# Patient Record
Sex: Female | Born: 1935 | Race: White | Hispanic: No | Smoking: Never smoker
Health system: Southern US, Community
[De-identification: ages and names within clinical notes are randomized; demographics above are authoritative.]

## PROBLEM LIST (undated history)

## (undated) DIAGNOSIS — M810 Age-related osteoporosis without current pathological fracture: Secondary | ICD-10-CM

## (undated) DIAGNOSIS — F419 Anxiety disorder, unspecified: Secondary | ICD-10-CM

## (undated) DIAGNOSIS — I10 Essential (primary) hypertension: Secondary | ICD-10-CM

## (undated) DIAGNOSIS — K219 Gastro-esophageal reflux disease without esophagitis: Secondary | ICD-10-CM

## (undated) DIAGNOSIS — M79609 Pain in unspecified limb: Secondary | ICD-10-CM

## (undated) DIAGNOSIS — M419 Scoliosis, unspecified: Secondary | ICD-10-CM

## (undated) DIAGNOSIS — K5730 Diverticulosis of large intestine without perforation or abscess without bleeding: Secondary | ICD-10-CM

## (undated) DIAGNOSIS — H269 Unspecified cataract: Secondary | ICD-10-CM

## (undated) DIAGNOSIS — I779 Disorder of arteries and arterioles, unspecified: Secondary | ICD-10-CM

## (undated) DIAGNOSIS — R32 Unspecified urinary incontinence: Secondary | ICD-10-CM

## (undated) DIAGNOSIS — R739 Hyperglycemia, unspecified: Secondary | ICD-10-CM

## (undated) DIAGNOSIS — IMO0002 Reserved for concepts with insufficient information to code with codable children: Secondary | ICD-10-CM

## (undated) DIAGNOSIS — K449 Diaphragmatic hernia without obstruction or gangrene: Secondary | ICD-10-CM

## (undated) DIAGNOSIS — M549 Dorsalgia, unspecified: Secondary | ICD-10-CM

## (undated) DIAGNOSIS — R002 Palpitations: Secondary | ICD-10-CM

## (undated) DIAGNOSIS — F32A Depression, unspecified: Secondary | ICD-10-CM

## (undated) DIAGNOSIS — F329 Major depressive disorder, single episode, unspecified: Secondary | ICD-10-CM

## (undated) DIAGNOSIS — N952 Postmenopausal atrophic vaginitis: Secondary | ICD-10-CM

## (undated) DIAGNOSIS — G4700 Insomnia, unspecified: Secondary | ICD-10-CM

## (undated) DIAGNOSIS — K5900 Constipation, unspecified: Secondary | ICD-10-CM

## (undated) DIAGNOSIS — N895 Stricture and atresia of vagina: Secondary | ICD-10-CM

## (undated) DIAGNOSIS — E7800 Pure hypercholesterolemia, unspecified: Secondary | ICD-10-CM

## (undated) DIAGNOSIS — R7989 Other specified abnormal findings of blood chemistry: Secondary | ICD-10-CM

## (undated) DIAGNOSIS — R609 Edema, unspecified: Secondary | ICD-10-CM

## (undated) DIAGNOSIS — R928 Other abnormal and inconclusive findings on diagnostic imaging of breast: Secondary | ICD-10-CM

## (undated) DIAGNOSIS — J310 Chronic rhinitis: Secondary | ICD-10-CM

## (undated) DIAGNOSIS — T7840XA Allergy, unspecified, initial encounter: Secondary | ICD-10-CM

## (undated) DIAGNOSIS — M542 Cervicalgia: Secondary | ICD-10-CM

## (undated) DIAGNOSIS — K4090 Unilateral inguinal hernia, without obstruction or gangrene, not specified as recurrent: Secondary | ICD-10-CM

## (undated) DIAGNOSIS — M25559 Pain in unspecified hip: Secondary | ICD-10-CM

## (undated) DIAGNOSIS — B351 Tinea unguium: Secondary | ICD-10-CM

## (undated) DIAGNOSIS — G44209 Tension-type headache, unspecified, not intractable: Secondary | ICD-10-CM

## (undated) DIAGNOSIS — N182 Chronic kidney disease, stage 2 (mild): Secondary | ICD-10-CM

## (undated) DIAGNOSIS — M25519 Pain in unspecified shoulder: Secondary | ICD-10-CM

## (undated) DIAGNOSIS — R351 Nocturia: Secondary | ICD-10-CM

## (undated) HISTORY — DX: Stricture and atresia of vagina: N89.5

## (undated) HISTORY — DX: Pain in unspecified shoulder: M25.519

## (undated) HISTORY — DX: Other abnormal and inconclusive findings on diagnostic imaging of breast: R92.8

## (undated) HISTORY — DX: Insomnia, unspecified: G47.00

## (undated) HISTORY — DX: Pain in unspecified limb: M79.609

## (undated) HISTORY — DX: Allergy, unspecified, initial encounter: T78.40XA

## (undated) HISTORY — DX: Tinea unguium: B35.1

## (undated) HISTORY — DX: Disorder of arteries and arterioles, unspecified: I77.9

## (undated) HISTORY — DX: Unspecified cataract: H26.9

## (undated) HISTORY — PX: CATARACT EXTRACTION: SUR2

## (undated) HISTORY — PX: COLONOSCOPY: SHX174

## (undated) HISTORY — PX: COSMETIC SURGERY: SHX468

## (undated) HISTORY — DX: Nocturia: R35.1

## (undated) HISTORY — PX: EYE SURGERY: SHX253

## (undated) HISTORY — DX: Unspecified urinary incontinence: R32

## (undated) HISTORY — DX: Palpitations: R00.2

## (undated) HISTORY — DX: Diverticulosis of large intestine without perforation or abscess without bleeding: K57.30

## (undated) HISTORY — DX: Chronic rhinitis: J31.0

## (undated) HISTORY — PX: TUBAL LIGATION: SHX77

## (undated) HISTORY — DX: Tension-type headache, unspecified, not intractable: G44.209

## (undated) HISTORY — DX: Hyperglycemia, unspecified: R73.9

## (undated) HISTORY — DX: Anxiety disorder, unspecified: F41.9

## (undated) HISTORY — DX: Postmenopausal atrophic vaginitis: N95.2

## (undated) HISTORY — DX: Chronic kidney disease, stage 2 (mild): N18.2

## (undated) HISTORY — PX: BREAST SURGERY: SHX581

## (undated) HISTORY — DX: Age-related osteoporosis without current pathological fracture: M81.0

## (undated) HISTORY — DX: Cervicalgia: M54.2

## (undated) HISTORY — PX: TONSILLECTOMY: SUR1361

## (undated) HISTORY — DX: Unilateral inguinal hernia, without obstruction or gangrene, not specified as recurrent: K40.90

## (undated) HISTORY — DX: Dorsalgia, unspecified: M54.9

## (undated) HISTORY — DX: Constipation, unspecified: K59.00

## (undated) HISTORY — DX: Other specified abnormal findings of blood chemistry: R79.89

## (undated) HISTORY — DX: Edema, unspecified: R60.9

## (undated) HISTORY — DX: Pain in unspecified hip: M25.559

---

## 1967-05-17 HISTORY — PX: ABDOMINAL HYSTERECTOMY: SHX81

## 1988-05-16 HISTORY — PX: CHOLECYSTECTOMY: SHX55

## 2000-05-16 HISTORY — PX: VAGINAL PROLAPSE REPAIR: SHX830

## 2000-05-16 HISTORY — PX: BILATERAL OOPHORECTOMY: SHX1221

## 2003-05-17 HISTORY — PX: OTHER SURGICAL HISTORY: SHX169

## 2004-11-26 DIAGNOSIS — H269 Unspecified cataract: Secondary | ICD-10-CM

## 2004-11-26 DIAGNOSIS — R32 Unspecified urinary incontinence: Secondary | ICD-10-CM | POA: Insufficient documentation

## 2004-11-26 DIAGNOSIS — R609 Edema, unspecified: Secondary | ICD-10-CM

## 2004-11-26 HISTORY — DX: Unspecified cataract: H26.9

## 2004-11-26 HISTORY — DX: Unspecified urinary incontinence: R32

## 2004-11-26 HISTORY — DX: Edema, unspecified: R60.9

## 2004-12-14 DIAGNOSIS — M810 Age-related osteoporosis without current pathological fracture: Secondary | ICD-10-CM

## 2004-12-14 DIAGNOSIS — N952 Postmenopausal atrophic vaginitis: Secondary | ICD-10-CM

## 2004-12-14 HISTORY — DX: Postmenopausal atrophic vaginitis: N95.2

## 2004-12-14 HISTORY — DX: Age-related osteoporosis without current pathological fracture: M81.0

## 2005-01-21 DIAGNOSIS — M549 Dorsalgia, unspecified: Secondary | ICD-10-CM

## 2005-01-21 DIAGNOSIS — K5900 Constipation, unspecified: Secondary | ICD-10-CM

## 2005-01-21 DIAGNOSIS — B351 Tinea unguium: Secondary | ICD-10-CM

## 2005-01-21 HISTORY — DX: Tinea unguium: B35.1

## 2005-01-21 HISTORY — DX: Dorsalgia, unspecified: M54.9

## 2005-01-21 HISTORY — DX: Constipation, unspecified: K59.00

## 2005-06-17 DIAGNOSIS — K5730 Diverticulosis of large intestine without perforation or abscess without bleeding: Secondary | ICD-10-CM

## 2005-06-17 HISTORY — DX: Diverticulosis of large intestine without perforation or abscess without bleeding: K57.30

## 2005-08-26 DIAGNOSIS — M79609 Pain in unspecified limb: Secondary | ICD-10-CM

## 2005-08-26 HISTORY — DX: Pain in unspecified limb: M79.609

## 2005-10-04 DIAGNOSIS — G44209 Tension-type headache, unspecified, not intractable: Secondary | ICD-10-CM

## 2005-10-04 HISTORY — DX: Tension-type headache, unspecified, not intractable: G44.209

## 2006-05-24 ENCOUNTER — Ambulatory Visit: Payer: Self-pay | Admitting: Gastroenterology

## 2006-05-25 ENCOUNTER — Encounter: Payer: Self-pay | Admitting: Gastroenterology

## 2006-05-30 ENCOUNTER — Ambulatory Visit: Payer: Self-pay | Admitting: Gastroenterology

## 2006-07-31 ENCOUNTER — Ambulatory Visit: Payer: Self-pay | Admitting: Gastroenterology

## 2006-08-01 ENCOUNTER — Ambulatory Visit: Payer: Self-pay | Admitting: Gastroenterology

## 2006-08-01 ENCOUNTER — Encounter (INDEPENDENT_AMBULATORY_CARE_PROVIDER_SITE_OTHER): Payer: Self-pay | Admitting: Specialist

## 2006-08-01 LAB — HM COLONOSCOPY

## 2006-08-15 DIAGNOSIS — M25519 Pain in unspecified shoulder: Secondary | ICD-10-CM

## 2006-08-15 HISTORY — DX: Pain in unspecified shoulder: M25.519

## 2006-08-29 ENCOUNTER — Ambulatory Visit: Payer: Self-pay | Admitting: Gastroenterology

## 2006-10-02 ENCOUNTER — Ambulatory Visit: Payer: Self-pay | Admitting: Gastroenterology

## 2006-10-03 LAB — CONVERTED CEMR LAB
ALT: 23 units/L (ref 0–40)
Albumin: 3.7 g/dL (ref 3.5–5.2)
Alkaline Phosphatase: 56 units/L (ref 39–117)
Total Bilirubin: 0.4 mg/dL (ref 0.3–1.2)
Total Protein: 6.5 g/dL (ref 6.0–8.3)

## 2007-04-03 DIAGNOSIS — R7989 Other specified abnormal findings of blood chemistry: Secondary | ICD-10-CM

## 2007-04-03 HISTORY — DX: Other specified abnormal findings of blood chemistry: R79.89

## 2008-09-19 DIAGNOSIS — R351 Nocturia: Secondary | ICD-10-CM

## 2008-09-19 HISTORY — DX: Nocturia: R35.1

## 2009-04-03 DIAGNOSIS — N182 Chronic kidney disease, stage 2 (mild): Secondary | ICD-10-CM

## 2009-04-03 HISTORY — DX: Chronic kidney disease, stage 2 (mild): N18.2

## 2009-09-29 DIAGNOSIS — R002 Palpitations: Secondary | ICD-10-CM

## 2009-09-29 HISTORY — DX: Palpitations: R00.2

## 2010-09-28 DIAGNOSIS — J310 Chronic rhinitis: Secondary | ICD-10-CM

## 2010-09-28 HISTORY — DX: Chronic rhinitis: J31.0

## 2010-10-01 NOTE — Assessment & Plan Note (Signed)
[facility]                         GASTROENTEROLOGY OFFICE NOTE   Tina Hampton, Tina Hampton                    MRN:          [id number]  DATE:05/24/2006                            DOB:          [birth year]    REFERRING PHYSICIAN:  [name]. Chilton Si, M.D.   REASON FOR REFERRAL:  Dr. Chilton Si asked me to evaluate Tina Hampton in  consultation regarding FOBT positive stool.   HPI:  Tina Hampton is a very pleasant and well-informed 77 year old  woman, who was originally diagnosed with iron deficiency anemia in 2006,  while she was living in [place].  She never required blood transfusions  for this, but she did undergo evaluation with a full colonoscopy, as  well as an upper endoscopy.  The colonoscopy was her third colonoscopy  in five to ten years.  Dr. Verta Ellen in [place] performed the  colonoscopy and found sigmoid diverticulosis and a small hyperplastic  polyp in her cecum.  An EGD was also performed in which she describes  some hyperemia at the GE junction, consistent with esophagitis, and a  small hiatal hernia.  Biopsies from the duodenum showed no sign of  sprue.  Stomach biopsies showed no H. pylori.  She also had a small  bowel follow-through that was normal.  Her treatment for her iron  deficiency anemia was to be placed on iron, as well as a proton pump  inhibitor.  The proton pump inhibitor was eventually changed to an H2  blocker by her hematologist and her iron was weaned as her hemoglobin  increased back to normal.  She was found again to be anemic, maybe a  year ago, was put back on iron and the anemia resolved.  She comes in  now with the report of a single black stool, one week ago.  This was  heme tested at her extended care facility, was found to be FOBT  positive.  Since then, she has had normal stools and she has been heme  testing all of them and they were all heme-negative.  She, in  retrospect, has felt fatigued for the past month  or so.  She held her  aspirin, starting a week ago.  She is still on her Pepcid AC and she has  not changed that to a PPI.   REVIEW OF SYSTEMS:  Known for mild fatigue.  No chest pain, no shortness  of breath.  Otherwise essentially normal and is available on the risk  intake sheet.   PAST MEDICAL HISTORY:  Mild depression, hypertension, elevated  cholesterol, arthritis, anxiety, hysterectomy in 1969, vaginal prolapse  repaired in 2002, cholecystectomy in the 1990s.   CURRENT MEDICATIONS:  Effexor, Menostar, Boniva, Premarin, glucosamine,  calcium, Pepcid AC, aspirin 81 mg once daily, Vytorin,  hydrochlorothiazide, Adalat, Centrum Silver, stool softener.   ALLERGIES:  To PENICILLIN.   SOCIAL HISTORY:  Married, three sons, retired Runner, broadcasting/film/video, nonsmoker,  nondrinker, but lives with her husband at nearby extended care facility.   FAMILY HISTORY:  No colon polyps or colon cancer in family besides her  own history of hyperplastic polyps.   PHYSICAL EXAMINATION:  The  patient is 5 feet 3.5 inches, 160 pounds,  blood pressure 122/64, pulse 84.  CONSTITUTIONAL:  Generally well-appearing.  NEUROLOGIC:  Alert and oriented times three.  EYES:  Extraocular movements intact.  MOUTH:  Oropharynx moist, no lesions.  NECK:  Supple, no lymphadenopathy.  CARDIOVASCULAR/HEART:  Regular rate and rhythm.  LUNGS:  Clear to auscultation bilaterally.  ABDOMEN:  Soft, nontender, nondistended.  Normal bowel sounds.  EXTREMITIES:  No lower extremity edema.  SKIN:  No rashes or lesions on visible extremities.   ASSESSMENT AND PLAN:  The patient is a 77 year old woman with fatigue,  recent black stool that was FOBT positive.   She has had a pretty thorough GI workup in 2006, including a  colonoscopy, upper endoscopy and a small bowel follow-through.  The only  real abnormality there was mild esophagitis.  I would like to get a  basic set of labs today, including a CBC and a complete metabolic  profile to  see if she truly is anemic, as she believes she is.  I  suspect that she is because she seems very in tune with her body and  medical history.  I will also arrange for her to have a repeat EGD in  the very near future.  She will continue taking the Pepcid AC once daily  and she will continue to hold the aspirin for now.  She will not restart  iron until we have a better idea of her blood counts.     Rachael Fee, MD  Electronically Signed    DPJ/MedQ  DD: 05/24/2006  DT: 05/24/2006  Job #: [id number]   cc:   [name]. Chilton Si, M.D.

## 2010-10-01 NOTE — Assessment & Plan Note (Signed)
Tina Hampton                         GASTROENTEROLOGY OFFICE NOTE   Tina Hampton, Tina Hampton                    MRN:          [id number]  DATE:07/31/2006                            DOB:          February 26, 1926    Tina Hampton is a 77 year old who was recently evaluated by Dr. Christella Hartigan  for hemoccult positive stool and anemia with a ferritin was in the low  normal range at 11.9.  She relates on and off dark stools and had recent  hemoccult positive stool noted.  An upper endoscopy performed by Dr.  Christella Hartigan in January 2008, showed a small hiatal hernia.  She had a prior  colonoscopy in [place] on June 10, 2004, by Dr. Germain Osgood, which showed sigmoid colon diverticulosis, a markedly redundant  sigmoid colon, a small cecal polyp which was hyperplastic on biopsy, and  external hemorrhoidal tags.  She relates the onset of mild lower  abdominal cramping that lasted about 24-48 hours and has since abated.  She noted small amounts of bright red blood per rectum and mucus per  rectum starting yesterday and these symptoms continued into this  morning.  All her symptoms have substantially improved and the abdominal  cramping has resolved.  She notes no nausea, vomiting, change in bowel  habits, constipation, diarrhea, fevers, or chills.  She denies any  recent antibiotic usage.   CURRENT MEDICATIONS:  Listed on the chart, updated and reviewed.   MEDICATION ALLERGIES:  PENICILLIN.   PHYSICAL EXAMINATION:  GENERAL:  Anxious, elderly, white female.  VITAL SIGNS:  Weight 155.4 pounds, blood pressure 108/64, pulse 96 and  regular.  CHEST:  Clear to auscultation bilaterally.  CARDIAC:  Regular rate and rhythm without murmurs appreciated.  ABDOMEN:  Soft, nontender, nondistended.  Normoactive bowel sounds.  No  palpable organomegaly, masses, or hernias.  RECTAL:  Deferred to time of colonoscopy.  NEUROLOGIC:  Anxious.  Alert and oriented x3.  Grossly  nonfocal.   ASSESSMENT/PLAN:  Small volume hematochezia with a history of hemoccult  positive stool and anemia.  She may have had a mild self limited colitis  such as an infectious or ischemic colitis.  Need to further exclude  colorectal neoplasms and other disorders.  Risks, benefits, and  alternatives to colonoscopy with possible biopsy and possible  polypectomy discussed with the patient.  She consents to proceed.  This  will be scheduled with Dr. Christella Hartigan within the next several days.     Venita Lick. Russella Dar, MD, [facility]  Electronically Signed    MTS/MedQ  DD: 07/31/2006  DT: 07/31/2006  Job #: [id number]   cc:   Rachael Fee, MD

## 2010-10-01 NOTE — Assessment & Plan Note (Signed)
[facility]                         GASTROENTEROLOGY OFFICE NOTE   Tina Hampton, Tina Hampton                    MRN:          [id number]  DATE:08/29/2006                            DOB:          Apr 18, 1926    PRIMARY CARE PHYSICIAN:  Dr. Murray Hodgkins.   GI PROBLEM LIST:  1. Mild intermittent abdominal discomfort.  Likely functional in      nature.  2. Minor bright red blood per rectum; colonoscopy March, 2008, by Dr.      Christella Hartigan showed a short segment of inflammation from 17 to 37 cm from      her anus, possibly diverticula or associated colitis or a short      patch of ischemia.  Biopsies suggested ischemic.   INTERVAL HISTORY:  I last saw Tina Hampton at the time of her colonoscopy  3 weeks ago.  At that time I put her on once daily steroid enemas for  this mild inflammation.  I was unsure if this was a short patch of IBD  ischemia or perhaps some diverticular associated colitis.  Her symptoms  have definitely improved since then.  It is not clear if the steroid  enemas were helping, as the biopsy suggested this was some mild ischemia  in her left colon.  She feels well today, although she does have  intermittent mild lower abdominal discomfort usually around the time of  BMs.   CURRENT MEDICINES:  Boniva, Premarin, glucosamine, calcium, Pepcid,  aspirin, Vytorin, hydrochlorothiazide, Adalat, Centrum, Prozac,  Wellbutrin.   PHYSICAL EXAM:  Weight 149 pounds, blood pressure 118/68, pulse 64.  CONSTITUTIONAL:  Well appearing.  HEART:  Regular rate and rhythm.  ABDOMEN:  Soft, nontender, nondistended.  Normal bowel sounds.  EXTREMITIES:  No lower extremity edema.   ASSESSMENT AND PLAN:  A 77 year old woman with likely mild left-sided  ischemic colitis that has resolved.   She feels much better overall and she will return to see me as needed.  Given her age, she does not need routine colorectal cancer screening  unless new symptoms  develop.     Rachael Fee, MD  Electronically Signed    DPJ/MedQ  DD: 08/29/2006  DT: 08/29/2006  Job #: [id number]   cc:   [name]. Chilton Si, M.D.

## 2010-10-25 LAB — HM DEXA SCAN

## 2011-04-19 DIAGNOSIS — R928 Other abnormal and inconclusive findings on diagnostic imaging of breast: Secondary | ICD-10-CM

## 2011-04-19 HISTORY — DX: Other abnormal and inconclusive findings on diagnostic imaging of breast: R92.8

## 2011-06-01 DIAGNOSIS — M461 Sacroiliitis, not elsewhere classified: Secondary | ICD-10-CM | POA: Diagnosis not present

## 2011-07-27 DIAGNOSIS — M461 Sacroiliitis, not elsewhere classified: Secondary | ICD-10-CM | POA: Diagnosis not present

## 2011-07-27 DIAGNOSIS — M76899 Other specified enthesopathies of unspecified lower limb, excluding foot: Secondary | ICD-10-CM | POA: Diagnosis not present

## 2011-08-04 DIAGNOSIS — F333 Major depressive disorder, recurrent, severe with psychotic symptoms: Secondary | ICD-10-CM | POA: Diagnosis not present

## 2011-08-08 DIAGNOSIS — F333 Major depressive disorder, recurrent, severe with psychotic symptoms: Secondary | ICD-10-CM | POA: Diagnosis not present

## 2011-10-13 DIAGNOSIS — E785 Hyperlipidemia, unspecified: Secondary | ICD-10-CM | POA: Diagnosis not present

## 2011-10-18 DIAGNOSIS — E785 Hyperlipidemia, unspecified: Secondary | ICD-10-CM | POA: Diagnosis not present

## 2011-10-18 DIAGNOSIS — I10 Essential (primary) hypertension: Secondary | ICD-10-CM | POA: Diagnosis not present

## 2011-10-18 DIAGNOSIS — F329 Major depressive disorder, single episode, unspecified: Secondary | ICD-10-CM | POA: Diagnosis not present

## 2011-10-18 DIAGNOSIS — G4700 Insomnia, unspecified: Secondary | ICD-10-CM

## 2011-10-18 HISTORY — DX: Insomnia, unspecified: G47.00

## 2011-10-27 DIAGNOSIS — N6489 Other specified disorders of breast: Secondary | ICD-10-CM | POA: Diagnosis not present

## 2011-10-27 LAB — HM MAMMOGRAPHY: HM Mammogram: NEGATIVE

## 2011-11-07 ENCOUNTER — Encounter (HOSPITAL_COMMUNITY): Payer: Self-pay | Admitting: Anesthesiology

## 2011-11-07 ENCOUNTER — Encounter (HOSPITAL_COMMUNITY)
Admission: EM | Disposition: A | Discharge status: Skilled Nursing Facility | Payer: Self-pay | Source: Home / Self Care | Attending: Family Medicine

## 2011-11-07 ENCOUNTER — Inpatient Hospital Stay (HOSPITAL_COMMUNITY): Payer: Medicare Other

## 2011-11-07 ENCOUNTER — Emergency Department (HOSPITAL_COMMUNITY): Payer: Medicare Other

## 2011-11-07 ENCOUNTER — Encounter (HOSPITAL_COMMUNITY): Payer: Self-pay | Admitting: *Deleted

## 2011-11-07 ENCOUNTER — Inpatient Hospital Stay (HOSPITAL_COMMUNITY)
Admission: EM | Admit: 2011-11-07 | Discharge: 2011-11-10 | DRG: 481 | Disposition: A | Discharge status: Skilled Nursing Facility | Payer: Medicare Other | Attending: Family Medicine | Admitting: Family Medicine

## 2011-11-07 ENCOUNTER — Inpatient Hospital Stay (HOSPITAL_COMMUNITY): Payer: Medicare Other | Admitting: Anesthesiology

## 2011-11-07 DIAGNOSIS — Z9181 History of falling: Secondary | ICD-10-CM | POA: Diagnosis not present

## 2011-11-07 DIAGNOSIS — M25559 Pain in unspecified hip: Secondary | ICD-10-CM | POA: Diagnosis not present

## 2011-11-07 DIAGNOSIS — R296 Repeated falls: Secondary | ICD-10-CM | POA: Diagnosis not present

## 2011-11-07 DIAGNOSIS — K449 Diaphragmatic hernia without obstruction or gangrene: Secondary | ICD-10-CM | POA: Diagnosis present

## 2011-11-07 DIAGNOSIS — Y9210 Unspecified residential institution as the place of occurrence of the external cause: Secondary | ICD-10-CM | POA: Diagnosis present

## 2011-11-07 DIAGNOSIS — W010XXA Fall on same level from slipping, tripping and stumbling without subsequent striking against object, initial encounter: Secondary | ICD-10-CM | POA: Diagnosis present

## 2011-11-07 DIAGNOSIS — M899 Disorder of bone, unspecified: Secondary | ICD-10-CM | POA: Diagnosis present

## 2011-11-07 DIAGNOSIS — R05 Cough: Secondary | ICD-10-CM | POA: Diagnosis not present

## 2011-11-07 DIAGNOSIS — F329 Major depressive disorder, single episode, unspecified: Secondary | ICD-10-CM | POA: Diagnosis present

## 2011-11-07 DIAGNOSIS — IMO0002 Reserved for concepts with insufficient information to code with codable children: Secondary | ICD-10-CM | POA: Insufficient documentation

## 2011-11-07 DIAGNOSIS — K219 Gastro-esophageal reflux disease without esophagitis: Secondary | ICD-10-CM | POA: Diagnosis present

## 2011-11-07 DIAGNOSIS — K5900 Constipation, unspecified: Secondary | ICD-10-CM | POA: Diagnosis present

## 2011-11-07 DIAGNOSIS — E782 Mixed hyperlipidemia: Secondary | ICD-10-CM

## 2011-11-07 DIAGNOSIS — I10 Essential (primary) hypertension: Secondary | ICD-10-CM | POA: Diagnosis not present

## 2011-11-07 DIAGNOSIS — E785 Hyperlipidemia, unspecified: Secondary | ICD-10-CM | POA: Diagnosis present

## 2011-11-07 DIAGNOSIS — M4120 Other idiopathic scoliosis, site unspecified: Secondary | ICD-10-CM | POA: Diagnosis present

## 2011-11-07 DIAGNOSIS — F3289 Other specified depressive episodes: Secondary | ICD-10-CM | POA: Diagnosis present

## 2011-11-07 DIAGNOSIS — R0902 Hypoxemia: Secondary | ICD-10-CM | POA: Diagnosis not present

## 2011-11-07 DIAGNOSIS — N390 Urinary tract infection, site not specified: Secondary | ICD-10-CM | POA: Diagnosis present

## 2011-11-07 DIAGNOSIS — S72009A Fracture of unspecified part of neck of unspecified femur, initial encounter for closed fracture: Secondary | ICD-10-CM | POA: Diagnosis not present

## 2011-11-07 DIAGNOSIS — M159 Polyosteoarthritis, unspecified: Secondary | ICD-10-CM

## 2011-11-07 DIAGNOSIS — Z96649 Presence of unspecified artificial hip joint: Secondary | ICD-10-CM | POA: Diagnosis not present

## 2011-11-07 DIAGNOSIS — S72033A Displaced midcervical fracture of unspecified femur, initial encounter for closed fracture: Secondary | ICD-10-CM | POA: Diagnosis not present

## 2011-11-07 DIAGNOSIS — J9819 Other pulmonary collapse: Secondary | ICD-10-CM | POA: Diagnosis not present

## 2011-11-07 DIAGNOSIS — M419 Scoliosis, unspecified: Secondary | ICD-10-CM | POA: Diagnosis present

## 2011-11-07 HISTORY — DX: Reserved for concepts with insufficient information to code with codable children: IMO0002

## 2011-11-07 HISTORY — DX: Gastro-esophageal reflux disease without esophagitis: K21.9

## 2011-11-07 HISTORY — DX: Pure hypercholesterolemia, unspecified: E78.00

## 2011-11-07 HISTORY — DX: Major depressive disorder, single episode, unspecified: F32.9

## 2011-11-07 HISTORY — PX: HIP PINNING,CANNULATED: SHX1758

## 2011-11-07 HISTORY — DX: Scoliosis, unspecified: M41.9

## 2011-11-07 HISTORY — DX: Diaphragmatic hernia without obstruction or gangrene: K44.9

## 2011-11-07 HISTORY — DX: Depression, unspecified: F32.A

## 2011-11-07 HISTORY — DX: Essential (primary) hypertension: I10

## 2011-11-07 HISTORY — PX: ORIF FEMORAL NECK FRACTURE W/ DHS: SUR930

## 2011-11-07 LAB — COMPREHENSIVE METABOLIC PANEL
AST: 31 U/L (ref 0–37)
CO2: 25 mEq/L (ref 19–32)
Chloride: 99 mEq/L (ref 96–112)
Creatinine, Ser: 0.79 mg/dL (ref 0.50–1.10)
GFR calc Af Amer: 86 mL/min — ABNORMAL LOW (ref >90)
GFR calc non Af Amer: 74 mL/min — ABNORMAL LOW (ref >90)
Glucose, Bld: 93 mg/dL (ref 70–99)
Total Bilirubin: 0.3 mg/dL (ref 0.3–1.2)

## 2011-11-07 LAB — URINALYSIS, ROUTINE W REFLEX MICROSCOPIC
Bilirubin Urine: NEGATIVE
Hgb urine dipstick: NEGATIVE
Ketones, ur: NEGATIVE mg/dL
Specific Gravity, Urine: 1.013 (ref 1.005–1.030)
Urobilinogen, UA: 0.2 mg/dL (ref 0.0–1.0)
pH: 7 (ref 5.0–8.0)

## 2011-11-07 LAB — DIFFERENTIAL
Basophils Absolute: 0 10*3/uL (ref 0.0–0.1)
Eosinophils Relative: 1 % (ref 0–5)
Lymphocytes Relative: 12 % (ref 12–46)
Lymphs Abs: 1.1 10*3/uL (ref 0.7–4.0)
Monocytes Absolute: 0.7 10*3/uL (ref 0.1–1.0)
Monocytes Relative: 7 % (ref 3–12)
Neutro Abs: 7.8 10*3/uL — ABNORMAL HIGH (ref 1.7–7.7)

## 2011-11-07 LAB — CBC
HCT: 40.2 % (ref 36.0–46.0)
Hemoglobin: 13.4 g/dL (ref 12.0–15.0)
MCV: 92.4 fL (ref 78.0–100.0)
RBC: 4.35 MIL/uL (ref 3.87–5.11)
RDW: 12.4 % (ref 11.5–15.5)
WBC: 9.8 10*3/uL (ref 4.0–10.5)

## 2011-11-07 LAB — ABO/RH: ABO/RH(D): A POS

## 2011-11-07 LAB — PROTIME-INR: INR: 0.93 (ref 0.00–1.49)

## 2011-11-07 LAB — TYPE AND SCREEN: Antibody Screen: NEGATIVE

## 2011-11-07 LAB — URINE MICROSCOPIC-ADD ON

## 2011-11-07 SURGERY — FIXATION, FEMUR, NECK, PERCUTANEOUS, USING SCREW
Anesthesia: General | Site: Hip | Laterality: Left | Wound class: Clean

## 2011-11-07 MED ORDER — WARFARIN SODIUM 5 MG PO TABS
5.0000 mg | ORAL_TABLET | Freq: Once | ORAL | Status: AC
  Administered 2011-11-07: 5 mg via ORAL
  Filled 2011-11-07: qty 1

## 2011-11-07 MED ORDER — PHENYLEPHRINE HCL 10 MG/ML IJ SOLN
10.0000 mg | INTRAVENOUS | Status: DC | PRN
  Administered 2011-11-07: 50 ug/min via INTRAVENOUS

## 2011-11-07 MED ORDER — ONDANSETRON HCL 4 MG/2ML IJ SOLN
INTRAMUSCULAR | Status: DC | PRN
  Administered 2011-11-07: 4 mg via INTRAVENOUS

## 2011-11-07 MED ORDER — SUCCINYLCHOLINE CHLORIDE 20 MG/ML IJ SOLN
INTRAMUSCULAR | Status: DC | PRN
  Administered 2011-11-07: 100 mg via INTRAVENOUS

## 2011-11-07 MED ORDER — DEXTROSE-NACL 5-0.45 % IV SOLN: INTRAVENOUS | Status: DC

## 2011-11-07 MED ORDER — WARFARIN VIDEO
Freq: Once | Status: AC
  Administered 2011-11-08: 20:00:00

## 2011-11-07 MED ORDER — ACETAMINOPHEN 650 MG RE SUPP: 650.0000 mg | Freq: Four times a day (QID) | RECTAL | Status: DC | PRN

## 2011-11-07 MED ORDER — OMEGA-3-ACID ETHYL ESTERS 1 G PO CAPS
1.0000 g | ORAL_CAPSULE | Freq: Two times a day (BID) | ORAL | Status: DC
  Administered 2011-11-08 – 2011-11-09 (×3): 1 g via ORAL
  Filled 2011-11-07 (×7): qty 1

## 2011-11-07 MED ORDER — EZETIMIBE-SIMVASTATIN 10-40 MG PO TABS
1.0000 | ORAL_TABLET | Freq: Every day | ORAL | Status: DC
  Administered 2011-11-08 – 2011-11-09 (×2): 1 via ORAL
  Filled 2011-11-07 (×3): qty 1

## 2011-11-07 MED ORDER — METOCLOPRAMIDE HCL 10 MG PO TABS: 5.0000 mg | ORAL_TABLET | Freq: Three times a day (TID) | ORAL | Status: DC | PRN

## 2011-11-07 MED ORDER — METOCLOPRAMIDE HCL 5 MG/ML IJ SOLN
INTRAMUSCULAR | Status: DC | PRN
  Administered 2011-11-07: 10 mg via INTRAVENOUS

## 2011-11-07 MED ORDER — DOCUSATE SODIUM 100 MG PO CAPS
200.0000 mg | ORAL_CAPSULE | Freq: Every day | ORAL | Status: DC
  Administered 2011-11-08 – 2011-11-09 (×2): 200 mg via ORAL
  Filled 2011-11-07 (×3): qty 2

## 2011-11-07 MED ORDER — METOPROLOL TARTRATE 1 MG/ML IV SOLN: 5.0000 mg | INTRAVENOUS | Status: DC | PRN

## 2011-11-07 MED ORDER — DEXAMETHASONE SODIUM PHOSPHATE 4 MG/ML IJ SOLN
INTRAMUSCULAR | Status: DC | PRN
  Administered 2011-11-07: 10 mg via INTRAVENOUS

## 2011-11-07 MED ORDER — ACETAMINOPHEN 10 MG/ML IV SOLN
INTRAVENOUS | Status: AC
  Filled 2011-11-07: qty 100

## 2011-11-07 MED ORDER — VANCOMYCIN HCL IN DEXTROSE 1-5 GM/200ML-% IV SOLN
INTRAVENOUS | Status: AC
  Filled 2011-11-07: qty 200

## 2011-11-07 MED ORDER — CLONAZEPAM 0.5 MG PO TABS: 0.5000 mg | ORAL_TABLET | Freq: Two times a day (BID) | ORAL | Status: DC | PRN

## 2011-11-07 MED ORDER — METOCLOPRAMIDE HCL 5 MG/ML IJ SOLN: 5.0000 mg | Freq: Three times a day (TID) | INTRAMUSCULAR | Status: DC | PRN

## 2011-11-07 MED ORDER — KCL IN DEXTROSE-NACL 20-5-0.45 MEQ/L-%-% IV SOLN
INTRAVENOUS | Status: DC
  Administered 2011-11-07: 23:00:00 via INTRAVENOUS
  Filled 2011-11-07 (×2): qty 1000

## 2011-11-07 MED ORDER — LIDOCAINE HCL (CARDIAC) 20 MG/ML IV SOLN
INTRAVENOUS | Status: DC | PRN
  Administered 2011-11-07: 30 mg via INTRAVENOUS

## 2011-11-07 MED ORDER — GUAIFENESIN-DM 100-10 MG/5ML PO SYRP: 5.0000 mL | ORAL_SOLUTION | ORAL | Status: DC | PRN

## 2011-11-07 MED ORDER — EPHEDRINE SULFATE 50 MG/ML IJ SOLN
INTRAMUSCULAR | Status: DC | PRN
  Administered 2011-11-07 (×5): 5 mg via INTRAVENOUS

## 2011-11-07 MED ORDER — MENTHOL 3 MG MT LOZG: 1.0000 | LOZENGE | OROMUCOSAL | Status: DC | PRN

## 2011-11-07 MED ORDER — ONDANSETRON HCL 4 MG/2ML IJ SOLN: 4.0000 mg | Freq: Four times a day (QID) | INTRAMUSCULAR | Status: DC | PRN

## 2011-11-07 MED ORDER — CALCIUM CARBONATE-VITAMIN D 500-200 MG-UNIT PO TABS
1.0000 | ORAL_TABLET | Freq: Every day | ORAL | Status: DC
  Administered 2011-11-08 – 2011-11-10 (×3): 1 via ORAL
  Filled 2011-11-07 (×3): qty 1

## 2011-11-07 MED ORDER — POLYETHYL GLYCOL-PROPYL GLYCOL 0.4-0.3 % OP SOLN: 1.0000 [drp] | Freq: Two times a day (BID) | OPHTHALMIC | Status: DC

## 2011-11-07 MED ORDER — ONDANSETRON HCL 4 MG PO TABS: 4.0000 mg | ORAL_TABLET | Freq: Four times a day (QID) | ORAL | Status: DC | PRN

## 2011-11-07 MED ORDER — SODIUM CHLORIDE 0.9 % IV SOLN: INTRAVENOUS | Status: DC

## 2011-11-07 MED ORDER — SODIUM CHLORIDE 0.9 % IJ SOLN
3.0000 mL | Freq: Two times a day (BID) | INTRAMUSCULAR | Status: DC
  Administered 2011-11-08 – 2011-11-09 (×4): 3 mL via INTRAVENOUS

## 2011-11-07 MED ORDER — FENTANYL CITRATE 0.05 MG/ML IJ SOLN
INTRAMUSCULAR | Status: DC | PRN
  Administered 2011-11-07 (×4): 50 ug via INTRAVENOUS

## 2011-11-07 MED ORDER — PROPOFOL 10 MG/ML IV EMUL
INTRAVENOUS | Status: DC | PRN
  Administered 2011-11-07: 110 mg via INTRAVENOUS

## 2011-11-07 MED ORDER — ALBUTEROL SULFATE (5 MG/ML) 0.5% IN NEBU: 2.5000 mg | INHALATION_SOLUTION | RESPIRATORY_TRACT | Status: DC | PRN

## 2011-11-07 MED ORDER — FAMOTIDINE 20 MG PO TABS
20.0000 mg | ORAL_TABLET | Freq: Every day | ORAL | Status: DC
  Administered 2011-11-08 – 2011-11-10 (×3): 20 mg via ORAL
  Filled 2011-11-07 (×3): qty 1

## 2011-11-07 MED ORDER — LACTATED RINGERS IV SOLN
INTRAVENOUS | Status: DC | PRN
  Administered 2011-11-07 (×2): via INTRAVENOUS

## 2011-11-07 MED ORDER — POLYVINYL ALCOHOL 1.4 % OP SOLN
1.0000 [drp] | Freq: Two times a day (BID) | OPHTHALMIC | Status: DC
  Administered 2011-11-07 – 2011-11-10 (×6): 1 [drp] via OPHTHALMIC
  Filled 2011-11-07: qty 15

## 2011-11-07 MED ORDER — VANCOMYCIN HCL IN DEXTROSE 1-5 GM/200ML-% IV SOLN
1000.0000 mg | Freq: Two times a day (BID) | INTRAVENOUS | Status: AC
  Administered 2011-11-08: 1000 mg via INTRAVENOUS
  Filled 2011-11-07: qty 200

## 2011-11-07 MED ORDER — HYDROCODONE-ACETAMINOPHEN 5-325 MG PO TABS
1.0000 | ORAL_TABLET | ORAL | Status: DC | PRN
  Administered 2011-11-08: 1 via ORAL
  Filled 2011-11-07: qty 1

## 2011-11-07 MED ORDER — KETAMINE HCL 10 MG/ML IJ SOLN
INTRAMUSCULAR | Status: DC | PRN
  Administered 2011-11-07: 10 mg via INTRAVENOUS
  Administered 2011-11-07: 5 mg via INTRAVENOUS
  Administered 2011-11-07: 10 mg via INTRAVENOUS

## 2011-11-07 MED ORDER — GLUCOSAMINE HCL 1000 MG PO TABS: 2000.0000 mg | ORAL_TABLET | Freq: Two times a day (BID) | ORAL | Status: DC

## 2011-11-07 MED ORDER — ADULT MULTIVITAMIN W/MINERALS CH
1.0000 | ORAL_TABLET | Freq: Every day | ORAL | Status: DC
  Administered 2011-11-08 – 2011-11-10 (×3): 1 via ORAL
  Filled 2011-11-07 (×3): qty 1

## 2011-11-07 MED ORDER — FISH OIL 1200 MG PO CAPS: 1200.0000 mg | ORAL_CAPSULE | Freq: Two times a day (BID) | ORAL | Status: DC

## 2011-11-07 MED ORDER — ONDANSETRON HCL 4 MG/2ML IJ SOLN: 4.0000 mg | Freq: Three times a day (TID) | INTRAMUSCULAR | Status: DC | PRN

## 2011-11-07 MED ORDER — ESTROGENS, CONJUGATED 0.625 MG/GM VA CREA
1.0000 g | TOPICAL_CREAM | VAGINAL | Status: DC
  Filled 2011-11-07: qty 42.5

## 2011-11-07 MED ORDER — VITAMIN D3 25 MCG (1000 UNIT) PO TABS
1000.0000 [IU] | ORAL_TABLET | Freq: Every day | ORAL | Status: DC
  Administered 2011-11-08 – 2011-11-10 (×3): 1000 [IU] via ORAL
  Filled 2011-11-07 (×3): qty 1

## 2011-11-07 MED ORDER — VANCOMYCIN HCL 1000 MG IV SOLR
1000.0000 mg | INTRAVENOUS | Status: DC | PRN
  Administered 2011-11-07: 1000 mg via INTRAVENOUS

## 2011-11-07 MED ORDER — PHENOL 1.4 % MT LIQD: 1.0000 | OROMUCOSAL | Status: DC | PRN

## 2011-11-07 MED ORDER — HYDROMORPHONE HCL PF 1 MG/ML IJ SOLN: 0.2500 mg | INTRAMUSCULAR | Status: DC | PRN

## 2011-11-07 MED ORDER — MORPHINE SULFATE 2 MG/ML IJ SOLN: 0.5000 mg | INTRAMUSCULAR | Status: DC | PRN

## 2011-11-07 MED ORDER — MAGNESIUM SULFATE 40 MG/ML IJ SOLN: 2.0000 g | Freq: Once | INTRAMUSCULAR | Status: DC

## 2011-11-07 MED ORDER — MORPHINE SULFATE 4 MG/ML IJ SOLN: 4.0000 mg | INTRAMUSCULAR | Status: DC | PRN

## 2011-11-07 MED ORDER — COUMADIN BOOK
Freq: Once | Status: AC
  Administered 2011-11-08: 18:00:00
  Filled 2011-11-07: qty 1

## 2011-11-07 MED ORDER — ENOXAPARIN SODIUM 30 MG/0.3ML ~~LOC~~ SOLN
30.0000 mg | Freq: Two times a day (BID) | SUBCUTANEOUS | Status: DC
  Administered 2011-11-08 – 2011-11-10 (×5): 30 mg via SUBCUTANEOUS
  Filled 2011-11-07 (×7): qty 0.3

## 2011-11-07 MED ORDER — MORPHINE SULFATE 2 MG/ML IJ SOLN: 2.0000 mg | INTRAMUSCULAR | Status: DC | PRN

## 2011-11-07 MED ORDER — PROMETHAZINE HCL 25 MG/ML IJ SOLN: 6.2500 mg | INTRAMUSCULAR | Status: DC | PRN

## 2011-11-07 MED ORDER — WARFARIN - PHARMACIST DOSING INPATIENT: Freq: Every day | Status: DC

## 2011-11-07 MED ORDER — BUPROPION HCL ER (XL) 300 MG PO TB24
450.0000 mg | ORAL_TABLET | Freq: Every day | ORAL | Status: DC
  Administered 2011-11-08 – 2011-11-10 (×3): 450 mg via ORAL
  Filled 2011-11-07 (×3): qty 1

## 2011-11-07 MED ORDER — HYDROCODONE-ACETAMINOPHEN 5-325 MG PO TABS: 1.0000 | ORAL_TABLET | Freq: Four times a day (QID) | ORAL | Status: DC | PRN

## 2011-11-07 MED ORDER — ACETAMINOPHEN 325 MG PO TABS: 650.0000 mg | ORAL_TABLET | Freq: Four times a day (QID) | ORAL | Status: DC | PRN

## 2011-11-07 MED ORDER — FLUOXETINE HCL 20 MG PO CAPS
40.0000 mg | ORAL_CAPSULE | Freq: Every day | ORAL | Status: DC
  Administered 2011-11-08 – 2011-11-10 (×3): 40 mg via ORAL
  Filled 2011-11-07 (×3): qty 2

## 2011-11-07 MED ORDER — METOPROLOL TARTRATE 25 MG PO TABS: 25.0000 mg | ORAL_TABLET | Freq: Two times a day (BID) | ORAL | Status: DC

## 2011-11-07 MED ORDER — MORPHINE SULFATE 4 MG/ML IJ SOLN
4.0000 mg | Freq: Once | INTRAMUSCULAR | Status: AC
  Administered 2011-11-07: 4 mg via INTRAVENOUS
  Filled 2011-11-07: qty 1

## 2011-11-07 MED ORDER — ACETAMINOPHEN 10 MG/ML IV SOLN
INTRAVENOUS | Status: DC | PRN
  Administered 2011-11-07: 1000 mg via INTRAVENOUS

## 2011-11-07 MED ORDER — NIFEDIPINE ER 60 MG PO TB24
60.0000 mg | ORAL_TABLET | Freq: Every day | ORAL | Status: DC
  Administered 2011-11-08 – 2011-11-10 (×3): 60 mg via ORAL
  Filled 2011-11-07 (×3): qty 1

## 2011-11-07 SURGICAL SUPPLY — 44 items
ADH SKN CLS APL DERMABOND .7 (GAUZE/BANDAGES/DRESSINGS) ×1
BAG DECANTER FOR FLEXI CONT (MISCELLANEOUS) ×2 IMPLANT
BAG SPEC THK2 15X12 ZIP CLS (MISCELLANEOUS) ×1
BAG ZIPLOCK 12X15 (MISCELLANEOUS) ×2 IMPLANT
BANDAGE GAUZE ELAST BULKY 4 IN (GAUZE/BANDAGES/DRESSINGS) ×2 IMPLANT
BIT DRILL 5 ACE CANN QC (BIT) ×1 IMPLANT
CLOTH BEACON ORANGE TIMEOUT ST (SAFETY) ×2 IMPLANT
DERMABOND ADVANCED (GAUZE/BANDAGES/DRESSINGS) ×1
DERMABOND ADVANCED .7 DNX12 (GAUZE/BANDAGES/DRESSINGS) IMPLANT
DRAPE STERI IOBAN 125X83 (DRAPES) ×2 IMPLANT
DRSG MEPILEX BORDER 4X4 (GAUZE/BANDAGES/DRESSINGS) ×1 IMPLANT
DRSG MEPILEX BORDER 4X8 (GAUZE/BANDAGES/DRESSINGS) IMPLANT
DRSG PAD ABDOMINAL 8X10 ST (GAUZE/BANDAGES/DRESSINGS) ×2 IMPLANT
DURAPREP 26ML APPLICATOR (WOUND CARE) ×2 IMPLANT
ELECT REM PT RETURN 9FT ADLT (ELECTROSURGICAL) ×2
ELECTRODE REM PT RTRN 9FT ADLT (ELECTROSURGICAL) ×1 IMPLANT
EVACUATOR 1/8 PVC DRAIN (DRAIN) IMPLANT
EVACUATOR SILICONE 100CC (DRAIN) IMPLANT
GAUZE XEROFORM 1X8 LF (GAUZE/BANDAGES/DRESSINGS) ×2 IMPLANT
GLOVE ECLIPSE 8.5 STRL (GLOVE) ×4 IMPLANT
GOWN STRL REIN XL XLG (GOWN DISPOSABLE) ×2 IMPLANT
KIT BASIN OR (CUSTOM PROCEDURE TRAY) ×2 IMPLANT
MANIFOLD NEPTUNE II (INSTRUMENTS) ×2 IMPLANT
NDL HYPO 25X1 1.5 SAFETY (NEEDLE) ×1 IMPLANT
NEEDLE HYPO 25X1 1.5 SAFETY (NEEDLE) ×2 IMPLANT
NS IRRIG 1000ML POUR BTL (IV SOLUTION) ×2 IMPLANT
PACK GENERAL/GYN (CUSTOM PROCEDURE TRAY) ×2 IMPLANT
PAD CAST 4YDX4 CTTN HI CHSV (CAST SUPPLIES) ×1 IMPLANT
PADDING CAST COTTON 4X4 STRL (CAST SUPPLIES) ×2
PIN THREADED GUIDE ACE (PIN) ×3 IMPLANT
POSITIONER SURGICAL ARM (MISCELLANEOUS) ×2 IMPLANT
SCREW CANN 6.5 75MM (Screw) ×4 IMPLANT
SCREW CANN 6.5 80MM (Screw) ×2 IMPLANT
SCREW CANN LG 6.5 FLT 75X22 (Screw) IMPLANT
SCREW CANN LG 6.5 FLT 80X22 (Screw) IMPLANT
SPONGE GAUZE 4X4 12PLY (GAUZE/BANDAGES/DRESSINGS) ×2 IMPLANT
STAPLER VISISTAT 35W (STAPLE) ×2 IMPLANT
SUT VIC AB 0 CT1 27 (SUTURE) ×4
SUT VIC AB 0 CT1 27XBRD ANTBC (SUTURE) ×2 IMPLANT
SUT VIC AB 2-0 CT1 27 (SUTURE) ×2
SUT VIC AB 2-0 CT1 27XBRD (SUTURE) ×1 IMPLANT
SYR CONTROL 10ML LL (SYRINGE) IMPLANT
TOWEL OR 17X26 10 PK STRL BLUE (TOWEL DISPOSABLE) ×4 IMPLANT
WATER STERILE IRR 1500ML POUR (IV SOLUTION) ×2 IMPLANT

## 2011-11-07 NOTE — ED Notes (Signed)
Attempted to call report. Receiving RN, Okey Regal [phone] busy with another pt.

## 2011-11-07 NOTE — ED Notes (Signed)
Lab reported urine was loose in the bag. Needs recollection.

## 2011-11-07 NOTE — Anesthesia Preprocedure Evaluation (Addendum)
Anesthesia Evaluation  Patient identified by MRN, date of birth, ID band Patient awake    Reviewed: Allergy & Precautions, H&P , NPO status , Patient's Chart, lab work & pertinent test results  Airway Mallampati: II TM Distance: >3 FB Neck ROM: Full    Dental No notable dental hx.    Pulmonary neg pulmonary ROS,  CXR: NAD breath sounds clear to auscultation  Pulmonary exam normal       Cardiovascular Exercise Tolerance: Good hypertension, Pt. on medications negative cardio ROS  Rhythm:Regular Rate:Normal     Neuro/Psych PSYCHIATRIC DISORDERS Depression  Neuromuscular disease    GI/Hepatic Neg liver ROS, hiatal hernia, GERD-  Medicated,  Endo/Other  negative endocrine ROS  Renal/GU negative Renal ROS  negative genitourinary   Musculoskeletal negative musculoskeletal ROS (+)   Abdominal   Peds negative pediatric ROS (+)  Hematology negative hematology ROS (+)   Anesthesia Other Findings   Reproductive/Obstetrics negative OB ROS                          Anesthesia Physical Anesthesia Plan  ASA: III  Anesthesia Plan: General   Post-op Pain Management:    Induction: Intravenous  Airway Management Planned: Oral ETT  Additional Equipment:   Intra-op Plan:   Post-operative Plan: Extubation in OR  Informed Consent: I have reviewed the patients History and Physical, chart, labs and discussed the procedure including the risks, benefits and alternatives for the proposed anesthesia with the patient or authorized representative who has indicated his/her understanding and acceptance.   Dental advisory given  Plan Discussed with: CRNA  Anesthesia Plan Comments: (Discussed risks/benefits of general versus spinal. Patient prefers general.)       Anesthesia Quick Evaluation

## 2011-11-07 NOTE — ED Notes (Signed)
OR called for [place]. [phone].

## 2011-11-07 NOTE — ED Notes (Signed)
Pt reports no SOB or chest pain. Pt c/o left wrist pain from catching herself during the fall and left hip pain.

## 2011-11-07 NOTE — Anesthesia Procedure Notes (Signed)
Procedure Name: Intubation Date/Time: 11/07/2011 7:43 PM Performed by: Randon Goldsmith CATHERINE PAYNE Pre-anesthesia Checklist: Patient identified, Emergency Drugs available, Suction available and Patient being monitored Patient Re-evaluated:Patient Re-evaluated prior to inductionOxygen Delivery Method: Circle system utilized Preoxygenation: Pre-oxygenation with 100% oxygen Intubation Type: IV induction Ventilation: Mask ventilation without difficulty Laryngoscope Size: Mac and 3 Grade View: Grade II Tube type: Oral Tube size: 7.5 mm Number of attempts: 1 Airway Equipment and Method: Stylet Placement Confirmation: positive ETCO2,  CO2 detector and breath sounds checked- equal and bilateral Secured at: 21 cm Tube secured with: Tape Dental Injury: Injury to lip  Comments: Small nick to upper left lip.

## 2011-11-07 NOTE — Brief Op Note (Signed)
11/07/2011  7:39 PM  PATIENT:  Tina Hampton  78 y.o. female  PRE-OPERATIVE DIAGNOSIS:  fractured left hip  POST-OPERATIVE DIAGNOSIS: valgus impacted left femoral neck fracture (Garden 2) PROCEDURE:  Procedure(s) (LRB): CANNULATED HIP PINNING (Left) With Biomet Cannulated screws x 3.  SURGEON:  Surgeon(s) and Role:    Kerrin Champagne, MD - Primary  ANESTHESIA:   general, Dr. Council Mechanic  EBL:75cc     BLOOD ADMINISTERED:none  DRAINS: Urinary Catheter (Foley)   LOCAL MEDICATIONS USED:  NONE  SPECIMEN:  No Specimen  DISPOSITION OF SPECIMEN:  N/A  COUNTS:  YES  TOURNIQUET:  * No tourniquets in log *  DICTATION: .Dragon Dictation  PLAN OF CARE: Admit to inpatient   PATIENT DISPOSITION:  PACU - hemodynamically stable.   Delay start of Pharmacological VTE agent (>24hrs) due to surgical blood loss or risk of bleeding: no

## 2011-11-07 NOTE — Anesthesia Postprocedure Evaluation (Signed)
  Anesthesia Post-op Note  Patient: Tina Hampton  Procedure(s) Performed: Procedure(s) (LRB): CANNULATED HIP PINNING (Left)  Patient Location: PACU  Anesthesia Type: General  Level of Consciousness: awake, alert , oriented and patient cooperative  Airway and Oxygen Therapy: Patient Spontanous Breathing and Patient connected to nasal cannula oxygen  Post-op Pain: none  Post-op Assessment: Post-op Vital signs reviewed, Patient's Cardiovascular Status Stable, Respiratory Function Stable, Patent Airway, No signs of Nausea or vomiting, Adequate PO intake and Pain level controlled  Post-op Vital Signs: Reviewed and stable  Complications: No apparent anesthesia complications

## 2011-11-07 NOTE — Op Note (Signed)
11/07/2011  8:57 PM  PATIENT:  Charlayne Vultaggio  78 y.o. female  MRN: [id number]  OPERATIVE REPORT  PRE-OPERATIVE DIAGNOSIS:  fractured left hip  POST-OPERATIVE DIAGNOSIS:  left hip fracture Garden 2 valgus impacted femoral neck fracture.  PROCEDURE:  Procedure(s): CANNULATED HIP PINNING, Biomet cannulated 6.0 screws x 3.    SURGEON:  Kerrin Champagne, MD       ANESTHESIA:  General, Dr. Council Mechanic.    COMPLICATIONS:  None.     COMPONENTS: Three  6.102mm x 80, 75, 75mm cannulated Ti screw.   EBL Less than 75 cc   PROCEDURE:  The patient was met in the holding area, and the appropriate left hip identified and marked with an "X" and my initials.  The patient was then transported to OR and was placed on the operative Jackson fracture table in a supine position. The patient was then placed under general anesthesia without difficulty. The patient received appropriate preoperative antibiotic prophylaxis of vancomycin 1 g. Right  leg was placed in a well leg holder. Left leg placed in a left foot boot, but no significant longitudinal traction was applied and the foot in approximately 15 of internal rotation. left groin post was used.The leg was then prepped using sterile conditions and draped using sterile technique. An iodine exclusion Vi-Drape was used. Time-out procedure was called and correct .  C-arm fluoroscopy was then brought into the field. Under C-arm fluoroscopy left hip was examined and the site for incision marked with Kelly clamp. Incision was made along the lateral aspect of the femur in line with the proximal femur just distal to the greater trochanter.  Incision approximately 3 inches in length. Subcutaneous layers then incised to the ITB. Incision was then carried in line with the iliotibial band this layers spread with Weitlander. The vastus lateralis was then incised after retraction anteriorly and blunt dissection used to expose the lateral aspect of the femur at the gradual curve  from the distal portion of the trochanter laterally. Multiple guidepins guide was then inserted and the first guide pin placed into the superior and midportion of the femoral neck and head on AP and lateral views. This was placed down to just short of subchondral bone about 2 or 3 mm on C-arm fluoroscopy in AP lateral planes in good position alignment area using this as a reference then 2 more pins were placed creating a triangle fixation the pins were placed as close to the cortex up the femoral neck is possible to obtain better purchase. Total of 3 guide pins were placed one superior and in the midportion head on the AP and lateral views respectively. The second placed in the inferior and posterior aspect of the neck was placed in the inferior and anterior aspect of the neck. Each of these were then measured for length the superficial cortex and overdrilled was taken to ensure that each of the guidepins were placed short of subchondral bone and there was no penetration of the joint. The knife then the superior most aspect measured at about 80-83 mm a 75 mm cannulated screw was chosen was then placed over the guidepin and using power placed within a few turns of the lateral cortex then a T-handle was used to seat the screw obtaining excellent purchase.  C-arm fluoroscopy used to check the alignment and position screws appear to be well placed by penetration area next then the second screw was placed in the posterior inferior position measuring length using a 75 mm length screw  over drilling the lateral cortex and then placing the screw within 3-4 mm of subchondral bone. The shorter screw length were used ensuring no penetration finding the anterior inferior screw was placed again measuring depth and 75 mm and over drilling the lateral cortex and plantar the cannulated screw within several turned to the lateral cortex and placing in and obtaining excellent purchase on all 3 screws. The guidepins were removed and  permanent documentation in AP and lateral planes obtained and using C-arm fluoroscopy care for rotation of the femur demonstrated all screws to be in good position alignment without evidence of penetration.  Following further irrigation and the 3 inch incision was closed first closing the vastus lateralis with a single interrupted 0 Vicryl suture then the tensor fascia lata with interrupted 0 Vicryl sutures subcutaneous layers with interrupted       2-0 Vicryl and a running subcutaneous stitch of 4-0 Vicryl. Dermabond was applied then MedPlex bandage. Patient was then returned to his bed reactivated extubated and returned to recovery room in satisfactory condition all instrument sponge counts were correct. Note that intraoperative C-arm images were obtained their quality was not sufficient for documentation so that permanent radiographs were obtained in the recovery room      Dewayne Jurek E  11/07/2011, 8:57 PM

## 2011-11-07 NOTE — ED Provider Notes (Addendum)
History     CSN: [id number]  Arrival date & time 11/07/11  1245   First MD Initiated Contact with Patient 11/07/11 1404      Chief Complaint  Patient presents with  . Fall  . Hip Pain     HPI Pt was walking today and her shoe stuck to the floor.  She then fell onto the left side landing on her left hip.  Pt also caught herself with her wrist and that is a bit sore as well.    No LOC.  Pt has not been able to get up and walk since the fall.  SHe denies any numbness or weakness.  NO associated symptoms.  The pain is severe with movement but at rest it is fine.  Past Medical History  Diagnosis Date  . High cholesterol   . Hypertension   . GERD (gastroesophageal reflux disease)   . Hiatal hernia   . Scoliosis   . DDD (degenerative disc disease)   . Depression     Past Surgical History  Procedure Date  . Tonsillectomy   . Breast surgery     benign tumor removal, left breast 1969  . Abdominal hysterectomy   . Cholecystectomy   . Vaginal prolapse repair   . Bilateral eyelid surgery   . Cataract extraction, bilateral     No family history on file.  History  Substance Use Topics  . Smoking status: Never Smoker   . Smokeless tobacco: Not on file  . Alcohol Use: No    OB History    Grav Para Term Preterm Abortions TAB SAB Ect Mult Living                  Review of Systems  All other systems reviewed and are negative.    Allergies  Penicillins  Home Medications  No current outpatient prescriptions on file.  BP 131/71  Pulse 76  Temp 98.1 F (36.7 C) (Oral)  Resp 18  SpO2 93%  Physical Exam  Nursing note and vitals reviewed. Constitutional: She appears well-developed and well-nourished. No distress.  HENT:  Head: Normocephalic and atraumatic.  Right Ear: External ear normal.  Left Ear: External ear normal.  Eyes: Conjunctivae are normal. Right eye exhibits no discharge. Left eye exhibits no discharge. No scleral icterus.  Neck: Neck supple. No  tracheal deviation present.  Cardiovascular: Normal rate, regular rhythm and intact distal pulses.   Pulmonary/Chest: Effort normal and breath sounds normal. No stridor. No respiratory distress. She has no wheezes. She has no rales.  Abdominal: Soft. Bowel sounds are normal. She exhibits no distension. There is no tenderness. There is no rebound and no guarding.  Musculoskeletal: She exhibits no edema and no tenderness.       Left hip: She exhibits tenderness. She exhibits normal range of motion and normal strength.       Nl distal pulses, sensation normal all 4 extrem  Neurological: She is alert. She has normal strength. No sensory deficit. Cranial nerve deficit:  no gross defecits noted. She exhibits normal muscle tone. She displays no seizure activity. Coordination normal.  Skin: Skin is warm and dry. No rash noted.  Psychiatric: She has a normal mood and affect.    ED Course  Procedures (including critical care time)  Labs Reviewed - No data to display Dg Hip Complete Left  11/07/2011  *RADIOLOGY REPORT*  Clinical Data: Fall, left hip pain.  LEFT HIP - COMPLETE 2+ VIEW  Comparison: None  Findings:  Degenerative joint disease changes in the hips bilaterally.  Severe rightward scoliosis in the lumbar spine with associated degenerative changes.  There is angulation in the lateral left femoral neck region.  This is concerning for slightly impacted left femoral neck fracture.  No dislocation.  Diffuse osteopenia.  IMPRESSION: Angulation of the cortex laterally in the subcapital left femoral neck concerning for slightly impacted left femoral neck fracture.  Original Report Authenticated By: Cyndie Chime, M.D.      MDM  Pt had a mechanical fall.  Noted to have a left femoral neck fracture.  I have discussed with case with Dr Otelia Sergeant.  He plan on surgery this evening.  I will consult the hospitalist for admission and medical clearance.        Celene Kras, MD 11/07/11 1554  Celene Kras,  MD 12/01/11 [phone]

## 2011-11-07 NOTE — Preoperative (Signed)
Beta Blockers   Reason not to administer Beta Blockers:Not Applicable 

## 2011-11-07 NOTE — Discharge Instructions (Signed)
Keep dressing dry. May use tub chair to shower  Call if there is odor or saturation of dressing or worsening pain not controlled with medications. Call if fever greater than 101.5. Use crutches or walker nonweight bearing on the left leg. Please follow up with an appointment with Dr. Otelia Sergeant  2 weeks from the time of surgery [phone]. Elevate as often as possible during the first week after surgery gradually increasing the time the leg is dependent or down there after. If swelling recurrs then elevate again. Wheel chair for longer distances.Apply ice to the surgery site as needed to relieve pain.

## 2011-11-07 NOTE — Transfer of Care (Signed)
Immediate Anesthesia Transfer of Care Note  Patient: Tina Hampton  Procedure(s) Performed: Procedure(s) (LRB): CANNULATED HIP PINNING (Left)  Patient Location: PACU  Anesthesia Type: General  Level of Consciousness: awake, alert , patient cooperative and responds to stimulation  Airway & Oxygen Therapy: Patient Spontanous Breathing and Patient connected to face mask  Post-op Assessment: Report given to PACU RN, Post -op Vital signs reviewed and stable and Patient moving all extremities  Post vital signs: Reviewed and stable  Complications: No apparent anesthesia complications

## 2011-11-07 NOTE — Progress Notes (Signed)
ANTICOAGULATION CONSULT NOTE - Initial Consult  Pharmacy Consult for Warfarin Indication: VTE prophylaxis  Allergies  Allergen Reactions  . Penicillins Other (See Comments)    CHILDHOOD REACTION    Patient Measurements:    Vital Signs: Temp: 97.5 F (36.4 C) (06/24 2209) Temp src: Oral (06/24 2209) BP: 133/65 mmHg (06/24 2209) Pulse Rate: 86  (06/24 2209)  Labs:  Basename 11/07/11 1510  HGB 13.4  HCT 40.2  PLT 237  APTT --  LABPROT 12.7  INR 0.93  HEPARINUNFRC --  CREATININE 0.79  CKTOTAL --  CKMB --  TROPONINI --    CrCl is unknown because there is no height on file for the current visit.   Medical History: Past Medical History  Diagnosis Date  . High cholesterol   . Hypertension   . GERD (gastroesophageal reflux disease)   . Hiatal hernia   . Scoliosis   . DDD (degenerative disc disease)   . Depression     Medications:  Scheduled:    . buPROPion  450 mg Oral Daily  . calcium-vitamin D  1 tablet Oral Daily  . cholecalciferol  1,000 Units Oral Daily  . conjugated estrogens  1 g Vaginal See admin instructions  . dextrose 5 % and 0.45% NaCl   Intravenous STAT  . docusate sodium  200 mg Oral QHS  . enoxaparin  30 mg Subcutaneous Q12H  . ezetimibe-simvastatin  1 tablet Oral QHS  . famotidine  20 mg Oral Daily  . Fish Oil  1,200 mg Oral BID  . FLUoxetine  40 mg Oral Daily  . Glucosamine HCl  2,000 mg Oral BID  .  morphine injection  4 mg Intravenous Once  . multivitamin with minerals  1 tablet Oral Daily  . NIFEdipine  60 mg Oral Daily  . Polyethyl Glycol-Propyl Glycol  1 drop Ophthalmic BID  . sodium chloride  3 mL Intravenous Q12H  . vancomycin  1,000 mg Intravenous Q12H  . DISCONTD: magnesium sulfate 1 - 4 g bolus IVPB  2 g Intravenous Once  . DISCONTD: metoprolol tartrate  25 mg Oral BID   Infusions:    . sodium chloride    . dextrose 5 % and 0.45 % NaCl with KCl 20 mEq/L      Assessment: 78 yo F s/p cannulated hip pinning for L hip  fracture. To start warfarin for DVT prophylaxis. Baseline CBC and PT/INR wnl.  Goal of Therapy:  INR 2-3   Plan:  1) Warfarin 5mg  PO x1 tonight 2) Warfarin book, education, and video prior to discharge 3) Daily PT/INR  Darrol Angel, PharmD Pager: [phone] 11/07/2011,10:17 PM

## 2011-11-07 NOTE — ED Notes (Signed)
Pt reports falling by tripping on a tile floor this am. Larey Seat on L side, c/o L hip pain. No obvious deformity. No shortening/rotation noted. Pt able to lift L leg, with pain however.

## 2011-11-07 NOTE — H&P (Addendum)
[facility]                                                                                    Patient Demographics  Tina Hampton, is a 78 y.o. female  CSN: [id number]  MRN: [id number]  DOB - [birth year]  Admit Date - 11/07/2011  Outpatient Primary MD for the patient is GREEN, [name], MD   With History of -  Past Medical History  Diagnosis Date  . High cholesterol   . Hypertension   . GERD (gastroesophageal reflux disease)   . Hiatal hernia   . Scoliosis   . DDD (degenerative disc disease)   . Depression       Past Surgical History  Procedure Date  . Tonsillectomy   . Breast surgery     benign tumor removal, left breast 1969  . Abdominal hysterectomy   . Cholecystectomy   . Vaginal prolapse repair   . Bilateral eyelid surgery   . Cataract extraction, bilateral     in for   Chief Complaint  Patient presents with  . Fall  . Hip Pain     HPI  Tina Hampton  is a 78 y.o. female, with H/O HTN, Dyslipidemia and GERD who is active and has no Cardiac problebs had a trip and fall at [facility], hurt her L Hip came to Er and was found to have L Fem fracture, labs and EKG-CXR stable, Ortho requetsed Med admit with OR later today.      Review of Systems    In addition to the HPI above,  No Fever-chills, No Headache, No changes with Vision or hearing, No problems swallowing food or Liquids, No Chest pain, Cough or Shortness of Breath, No Abdominal pain, No Nausea or Vommitting, Bowel movements are regular, No Blood in stool or Urine, No dysuria, No new skin rashes or bruises, some dull constant L hip non radiating pain - sharp, + with movement better with rest no associated symptoms. No new weakness, tingling, numbness in any extremity, No recent weight gain or loss, No polyuria, polydypsia or polyphagia, No significant Mental Stressors.  A full 10 point Review of Systems was done, except as stated above, all other Review of  Systems were negative.   Social History History  Substance Use Topics  . Smoking status: Never Smoker   . Smokeless tobacco: Never Used  . Alcohol Use: No      Family History No CAD  Prior to Admission medications   Medication Sig Start Date End Date Taking? Authorizing Provider  buPROPion (WELLBUTRIN XL) 150 MG 24 hr tablet Take 450 mg by mouth daily.   Yes Historical Provider, MD  calcium-vitamin D (OSCAL WITH D) 500-200 MG-UNIT per tablet Take 1 tablet by mouth daily.   Yes Historical Provider, MD  cholecalciferol (VITAMIN D) 1000 UNITS tablet Take 1,000 Units by mouth daily.   Yes Historical Provider, MD  clonazePAM (KLONOPIN) 0.5 MG tablet Take 0.5 mg by mouth 2 (two) times daily as needed. anxiety   Yes Historical Provider, MD  conjugated estrogens (PREMARIN) vaginal cream Place 1 g vaginally See admin instructions. Pt uses twice weekly. No certain days  Yes Historical Provider, MD  docusate sodium (COLACE) 100 MG capsule Take 200 mg by mouth at bedtime.   Yes Historical Provider, MD  ezetimibe-simvastatin (VYTORIN) 10-40 MG per tablet Take 1 tablet by mouth at bedtime.   Yes Historical Provider, MD  famotidine (PEPCID) 20 MG tablet Take 20 mg by mouth daily.   Yes Historical Provider, MD  FLUoxetine (PROZAC) 40 MG capsule Take 40 mg by mouth daily.   Yes Historical Provider, MD  Glucosamine HCl 1000 MG TABS Take 2,000 mg by mouth 2 (two) times daily.   Yes Historical Provider, MD  Multiple Vitamin (MULTIVITAMIN WITH MINERALS) TABS Take 1 tablet by mouth daily.   Yes Historical Provider, MD  NIFEdipine (PROCARDIA-XL/ADALAT CC) 60 MG 24 hr tablet Take 60 mg by mouth daily.   Yes Historical Provider, MD  Omega-3 Fatty Acids (FISH OIL) 1200 MG CAPS Take 1,200 mg by mouth 2 (two) times daily.   Yes Historical Provider, MD  Polyethyl Glycol-Propyl Glycol (SYSTANE) 0.4-0.3 % SOLN Apply 1 drop to eye 2 (two) times daily.   Yes Historical Provider, MD    Allergies  Allergen Reactions   . Penicillins Other (See Comments)    CHILDHOOD REACTION    Physical Exam  Vitals  Blood pressure 145/72, pulse 82, temperature 98.1 F (36.7 C), temperature source Oral, resp. rate 18, SpO2 94.00%.   1. General elderly white female lying in bed in NAD,     2. Normal affect and insight, Not Suicidal or Homicidal, Awake Alert, Oriented *3.  3. No F.N deficits, ALL C.Nerves Intact, Strength 5/5 all 4 extremities, Sensation intact all 4 extremities, Plantars down going.  4. Ears and Eyes appear Normal, Conjunctivae clear, PERRLA. Moist Oral Mucosa.  5. Supple Neck, No JVD, No cervical lymphadenopathy appriciated, No Carotid Bruits.  6. Symmetrical Chest wall movement, Good air movement bilaterally, CTAB.  7. RRR, No Gallops, Rubs or Murmurs, No Parasternal Heave.  8. Positive Bowel Sounds, Abdomen Soft, Non tender, No organomegaly appriciated,  No rebound -guarding or rigidity.  9.  No Cyanosis, Normal Skin Turgor, No Skin Rash or Bruise.  10. Good muscle tone,  joints appear normal , no effusions, Normal ROM.L leg shortened and Int rotated  11. No Palpable Lymph Nodes in Neck or Axillae     Data Review  CBC  Lab 11/07/11 1510  WBC 9.8  HGB 13.4  HCT 40.2  PLT 237  MCV 92.4  MCH 30.8  MCHC 33.3  RDW 12.4  LYMPHSABS 1.1  MONOABS 0.7  EOSABS 0.1  BASOSABS 0.0  BANDABS --   ------------------------------------------------------------------------------------------------------------------  Chemistries   Lab 11/07/11 1510  NA 136  K 3.8  CL 99  CO2 25  GLUCOSE 93  BUN 16  CREATININE 0.79  CALCIUM 9.5  MG --  AST 31  ALT 22  ALKPHOS 61  BILITOT 0.3   ------------------------------------------------------------------------------------------------------------------ CrCl is unknown because there is no height on file for the current  visit. ------------------------------------------------------------------------------------------------------------------ No results found for this basename: TSH,T4TOTAL,FREET3,T3FREE,THYROIDAB in the last 72 hours   Coagulation profile  Lab 11/07/11 1510  INR 0.93  PROTIME --   ------------------------------------------------------------------------------------------------------------------- No results found for this basename: DDIMER:2 in the last 72 hours -------------------------------------------------------------------------------------------------------------------  Cardiac Enzymes No results found for this basename: CK:3,CKMB:3,TROPONINI:3,MYOGLOBIN:3 in the last 168 hours ------------------------------------------------------------------------------------------------------------------ No components found with this basename: POCBNP:3   ---------------------------------------------------------------------------------------------------------------  Urinalysis    Component Value Date/Time   COLORURINE YELLOW 11/07/2011 1514   APPEARANCEUR CLEAR 11/07/2011 1514  LABSPEC 1.013 11/07/2011 1514   PHURINE 7.0 11/07/2011 1514   GLUCOSEU NEGATIVE 11/07/2011 1514   HGBUR NEGATIVE 11/07/2011 1514   BILIRUBINUR NEGATIVE 11/07/2011 1514   KETONESUR NEGATIVE 11/07/2011 1514   PROTEINUR NEGATIVE 11/07/2011 1514   UROBILINOGEN 0.2 11/07/2011 1514   NITRITE NEGATIVE 11/07/2011 1514   LEUKOCYTESUR TRACE* 11/07/2011 1514     Imaging results:   Dg Wrist Complete Left  11/07/2011  *RADIOLOGY REPORT*  Clinical Data: Fall, left hip pain  LEFT WRIST - COMPLETE 3+ VIEW  Comparison: None.  Findings: Four views of the left wrist submitted.  No acute fracture or subluxation.  There is diffuse osteopenia. Degenerative changes are noted first carpal metacarpal joint.  IMPRESSION: Diffuse osteopenia.  No acute fracture or subluxation.  Generative changes first carpal metacarpal joint.  Original Report  Authenticated By: Natasha Mead, M.D.   Dg Hip Complete Left  11/07/2011  *RADIOLOGY REPORT*  Clinical Data: Fall, left hip pain.  LEFT HIP - COMPLETE 2+ VIEW  Comparison: None  Findings: Degenerative joint disease changes in the hips bilaterally.  Severe rightward scoliosis in the lumbar spine with associated degenerative changes.  There is angulation in the lateral left femoral neck region.  This is concerning for slightly impacted left femoral neck fracture.  No dislocation.  Diffuse osteopenia.  IMPRESSION: Angulation of the cortex laterally in the subcapital left femoral neck concerning for slightly impacted left femoral neck fracture.  Original Report Authenticated By: Cyndie Chime, M.D.   Dg Chest Port 1 View  11/07/2011  *RADIOLOGY REPORT*  Clinical Data: Cough.  PORTABLE CHEST - 1 VIEW  Comparison: None.  Findings: Heart is borderline in size.  No confluent airspace opacity or effusion.  No acute bony abnormality. Mild hyperinflation.  IMPRESSION: No acute findings.  Original Report Authenticated By: Cyndie Chime, M.D.    My personal review of EKG: Rhythm NSR, Rate  83 /min, no Acute ST changes     Assessment & Plan   1. Mech Fall causing Left  impacted left femoral neck fracture - Ortho Dr Otelia Sergeant to see, will operate later today, patient Low-Mod risk for adverse Card-Pulm outcome, she is pretty active at home her activity is only limited by her back pain which is due to DJD and scoliosis, she does not have any active chest symptoms no chest pain with exertion no shortness of breath no palpitations.   Cardio-Pulm Risk stratification for surgery and recommendations to minimize the same:-  A.Cardio-Pulmonary Risk -  this patient is a low to moderate for adverse Cardio-Pulmonary  Outcome  from surgery, the risks and benefits were discussed and acceptable to the patient.  Recommendations for optimizing Cardio-Pulmonary  Risk risk factors  1. Keep SBP<140, HR<85, use Lopressor 5mg  IV  q4hrs PRN, or B.Blocker drip PRN. 2. Tight I&O goal to keep even. 3. Minimal sedation. 4. Good pulmunary toilet. 5. Scheduled/PRN Nebs ordered keep Pox>90% 6. Hb>8, transfuse as needed- Lasix 10mg  IV after each unit PRBC Transfused.   B.Bleeding Risk - no previous surgical complications, no easy bruising, INR 0.9, Platelet count 237   Will request Surgeon to please Order Lovenox/DVT prophylaxis of his/her choice when OK from Surgeon's standpoint post op.    2. History of hypertension- we'll continue her home dose nifedipine, PRN IV Lopressor.    3. GERD and hiatal hernia - continue on home dose Pepcid no acute issues.    4. History of mixed dyslipidemia will continue on Vytorin and fish oil.  DVT Prophylaxis  SCDs pre op then per Ortho  AM Labs Ordered, also please review Full Orders  Admission, patients condition and plan of care including tests being ordered have been discussed with the patient  who indicates understanding and agree with the plan and Code Status.  Code Status Full  Condition Marinell Blight K M.D on 11/07/2011 at 4:52 PM  Between 7am to 7pm - Pager - [phone]  After 7pm go to [email or web address] - password [email or web address]  And look for the night coverage person covering me after hours  [facility] Office  [phone]

## 2011-11-07 NOTE — ED Notes (Signed)
MD at bedside. 

## 2011-11-07 NOTE — Consult Note (Signed)
Reason for Consult:Left Hip Pain Referring Physician: Dr. Lynelle Doctor Consulting Physician:Riley Hallum E  Orthopedic Diagnosis:1)Left Impacted valgus femoral neack fracture.2)Collapsing degenerative thoracolumbar scoliosis.3)HTN 4) GERD  Tina Hampton is an 78 y.o. female.fell this AM while visiting her husband in the acute care area of [facility], Where he is an inpatient. She lives in the assisted living area of [facility]. Apparently her shoes slipped on a shiney tile floor landing on her left hip. With pain and inability to bear weight on the left leg. Able to move the leg though. xrays show an impacted valgus (Garden 2) femoral neck fracture.   Past Medical History  Diagnosis Date  . High cholesterol   . Hypertension   . GERD (gastroesophageal reflux disease)   . Hiatal hernia   . Scoliosis   . DDD (degenerative disc disease)   . Depression     Past Surgical History  Procedure Date  . Tonsillectomy   . Breast surgery     benign tumor removal, left breast 1969  . Abdominal hysterectomy   . Cholecystectomy   . Vaginal prolapse repair   . Bilateral eyelid surgery   . Cataract extraction, bilateral     History reviewed. No pertinent family history.  Social History:  reports that she has never smoked. She has never used smokeless tobacco. She reports that she does not drink alcohol or use illicit drugs.  Allergies:  Allergies  Allergen Reactions  . Penicillins Other (See Comments)    CHILDHOOD REACTION    Medications: Prior to Admission:  (Not in a hospital admission)  Results for orders placed during the hospital encounter of 11/07/11 (from the past 48 hour(s))  CBC     Status: Normal   Collection Time   11/07/11  3:10 PM      Component Value Range Comment   WBC 9.8  4.0 - 10.5 K/uL    RBC 4.35  3.87 - 5.11 MIL/uL    Hemoglobin 13.4  12.0 - 15.0 g/dL    HCT 95.6  21.3 - 08.6 %    MCV 92.4  78.0 - 100.0 fL    MCH 30.8  26.0 - 34.0 pg    MCHC 33.3  30.0  - 36.0 g/dL    RDW 57.8  46.9 - 62.9 %    Platelets 237  150 - 400 K/uL   DIFFERENTIAL     Status: Abnormal   Collection Time   11/07/11  3:10 PM      Component Value Range Comment   Neutrophils Relative 80 (*) 43 - 77 %    Neutro Abs 7.8 (*) 1.7 - 7.7 K/uL    Lymphocytes Relative 12  12 - 46 %    Lymphs Abs 1.1  0.7 - 4.0 K/uL    Monocytes Relative 7  3 - 12 %    Monocytes Absolute 0.7  0.1 - 1.0 K/uL    Eosinophils Relative 1  0 - 5 %    Eosinophils Absolute 0.1  0.0 - 0.7 K/uL    Basophils Relative 0  0 - 1 %    Basophils Absolute 0.0  0.0 - 0.1 K/uL   COMPREHENSIVE METABOLIC PANEL     Status: Abnormal   Collection Time   11/07/11  3:10 PM      Component Value Range Comment   Sodium 136  135 - 145 mEq/L    Potassium 3.8  3.5 - 5.1 mEq/L    Chloride 99  96 - 112 mEq/L  CO2 25  19 - 32 mEq/L    Glucose, Bld 93  70 - 99 mg/dL    BUN 16  6 - 23 mg/dL    Creatinine, Ser 4.09  0.50 - 1.10 mg/dL    Calcium 9.5  8.4 - 81.1 mg/dL    Total Protein 7.1  6.0 - 8.3 g/dL    Albumin 4.0  3.5 - 5.2 g/dL    AST 31  0 - 37 U/L    ALT 22  0 - 35 U/L    Alkaline Phosphatase 61  39 - 117 U/L    Total Bilirubin 0.3  0.3 - 1.2 mg/dL    GFR calc non Af Amer 74 (*) >90 mL/min    GFR calc Af Amer 86 (*) >90 mL/min   TYPE AND SCREEN     Status: Normal   Collection Time   11/07/11  3:10 PM      Component Value Range Comment   ABO/RH(D) A POS      Antibody Screen NEG      Sample Expiration 11/10/2011     PROTIME-INR     Status: Normal   Collection Time   11/07/11  3:10 PM      Component Value Range Comment   Prothrombin Time 12.7  11.6 - 15.2 seconds    INR 0.93  0.00 - 1.49   URINALYSIS, ROUTINE W REFLEX MICROSCOPIC     Status: Abnormal   Collection Time   11/07/11  3:14 PM      Component Value Range Comment   Color, Urine YELLOW  YELLOW    APPearance CLEAR  CLEAR    Specific Gravity, Urine 1.013  1.005 - 1.030    pH 7.0  5.0 - 8.0    Glucose, UA NEGATIVE  NEGATIVE mg/dL    Hgb urine  dipstick NEGATIVE  NEGATIVE    Bilirubin Urine NEGATIVE  NEGATIVE    Ketones, ur NEGATIVE  NEGATIVE mg/dL    Protein, ur NEGATIVE  NEGATIVE mg/dL    Urobilinogen, UA 0.2  0.0 - 1.0 mg/dL    Nitrite NEGATIVE  NEGATIVE    Leukocytes, UA TRACE (*) NEGATIVE   URINE MICROSCOPIC-ADD ON     Status: Abnormal   Collection Time   11/07/11  3:14 PM      Component Value Range Comment   Squamous Epithelial / LPF RARE  RARE    WBC, UA 3-6  <3 WBC/hpf    Bacteria, UA MANY (*) RARE     Dg Wrist Complete Left  11/07/2011  *RADIOLOGY REPORT*  Clinical Data: Fall, left hip pain  LEFT WRIST - COMPLETE 3+ VIEW  Comparison: None.  Findings: Four views of the left wrist submitted.  No acute fracture or subluxation.  There is diffuse osteopenia. Degenerative changes are noted first carpal metacarpal joint.  IMPRESSION: Diffuse osteopenia.  No acute fracture or subluxation.  Generative changes first carpal metacarpal joint.  Original Report Authenticated By: Natasha Mead, M.D.   Dg Hip Complete Left  11/07/2011  *RADIOLOGY REPORT*  Clinical Data: Fall, left hip pain.  LEFT HIP - COMPLETE 2+ VIEW  Comparison: None  Findings: Degenerative joint disease changes in the hips bilaterally.  Severe rightward scoliosis in the lumbar spine with associated degenerative changes.  There is angulation in the lateral left femoral neck region.  This is concerning for slightly impacted left femoral neck fracture.  No dislocation.  Diffuse osteopenia.  IMPRESSION: Angulation of the cortex laterally in the subcapital left  femoral neck concerning for slightly impacted left femoral neck fracture.  Original Report Authenticated By: Cyndie Chime, M.D.   Dg Chest Port 1 View  11/07/2011  *RADIOLOGY REPORT*  Clinical Data: Cough.  PORTABLE CHEST - 1 VIEW  Comparison: None.  Findings: Heart is borderline in size.  No confluent airspace opacity or effusion.  No acute bony abnormality. Mild hyperinflation.  IMPRESSION: No acute findings.  Original  Report Authenticated By: Cyndie Chime, M.D.    @ROS @ Blood pressure 140/66, pulse 76, temperature 98.1 F (36.7 C), temperature source Oral, resp. rate 15, SpO2 94.00%.   Orthopaedic Exam: Left hip is able to be actively moved but with pain.DP 2+, PT 2+ motor and sensory intact. Scoliosis of the T-L spine.  Last bone density with osteopenia.  Assessment/Plan: Left valgus impacted femoral neck fracture. Collapsing degenerative scoliosis. Osteopenia.  Plan: Plan to perform cannulated screw fixation of the left impacted femoral neck fracture.  Fredy Gladu E 11/07/2011, 6:22 PM

## 2011-11-07 NOTE — Progress Notes (Signed)
PHARMACIST - PHYSICIAN ORDER COMMUNICATION  CONCERNING: P&T Medication Policy on Herbal Medications  DESCRIPTION:  This patient's order for:  Glucosamine  has been noted.  This product(s) is classified as an "herbal" or natural product. Due to a lack of definitive safety studies or FDA approval, nonstandard manufacturing practices, plus the potential risk of unknown drug-drug interactions while on inpatient medications, the Pharmacy and Therapeutics Committee does not permit the use of "herbal" or natural products of this type within [facility].   ACTION TAKEN: The pharmacy department is unable to verify this order at this time and your patient has been informed of this safety policy. Please reevaluate patient's clinical condition at discharge and address if the herbal or natural product(s) should be resumed at that time.   Tina Hampton, Tina Hampton 11/07/2011 10:27 PM

## 2011-11-08 DIAGNOSIS — M159 Polyosteoarthritis, unspecified: Secondary | ICD-10-CM

## 2011-11-08 DIAGNOSIS — S72009A Fracture of unspecified part of neck of unspecified femur, initial encounter for closed fracture: Secondary | ICD-10-CM

## 2011-11-08 DIAGNOSIS — E782 Mixed hyperlipidemia: Secondary | ICD-10-CM

## 2011-11-08 DIAGNOSIS — I10 Essential (primary) hypertension: Secondary | ICD-10-CM

## 2011-11-08 LAB — BASIC METABOLIC PANEL
BUN: 14 mg/dL (ref 6–23)
CO2: 25 mEq/L (ref 19–32)
Chloride: 100 mEq/L (ref 96–112)
Glucose, Bld: 149 mg/dL — ABNORMAL HIGH (ref 70–99)
Potassium: 3.9 mEq/L (ref 3.5–5.1)

## 2011-11-08 LAB — PROTIME-INR: INR: 1.05 (ref 0.00–1.49)

## 2011-11-08 LAB — HEMOGLOBIN AND HEMATOCRIT, BLOOD: HCT: 37.2 % (ref 36.0–46.0)

## 2011-11-08 MED ORDER — WARFARIN SODIUM 5 MG PO TABS
5.0000 mg | ORAL_TABLET | Freq: Once | ORAL | Status: AC
  Administered 2011-11-08: 5 mg via ORAL
  Filled 2011-11-08: qty 1

## 2011-11-08 MED ORDER — HYDROCODONE-ACETAMINOPHEN 5-325 MG PO TABS
1.0000 | ORAL_TABLET | ORAL | Status: DC | PRN
  Administered 2011-11-08 – 2011-11-10 (×5): 1 via ORAL
  Filled 2011-11-08 (×5): qty 1

## 2011-11-08 NOTE — Clinical Social Work Psychosocial (Unsigned)
     Clinical Social Work Department BRIEF PSYCHOSOCIAL ASSESSMENT 11/08/2011  Patient:  Tina Hampton, Tina Hampton     Account Number:  [id number]     Admit date:  11/07/2011  Clinical Social Worker:  Hattie Perch  Date/Time:  11/08/2011 12:00 M  Referred by:  Physician  Date Referred:  11/08/2011 Referred for  SNF Placement   Other Referral:   Interview type:  Patient Other interview type:    PSYCHOSOCIAL DATA Living Status:  ALONE Admitted from facility:   Level of care:   Primary support name:  Winifred Olive Primary support relationship to patient:  CHILD, ADULT Degree of support available:   excellent    CURRENT CONCERNS Current Concerns  Post-Acute Placement   Other Concerns:    SOCIAL WORK ASSESSMENT / PLAN patient is alert and oriented x4. patient is in need of snf placement due to hip fracture. patient is frm [facility] and there is a bed available in their snf.   Assessment/plan status:   Other assessment/ plan:   Information/referral to community resources:    PATIENTS/FAMILYS RESPONSE TO PLAN OF CARE: patient is agreeable to snf at [facility] upon discharge.

## 2011-11-08 NOTE — Progress Notes (Signed)
Subjective: 1 Day Post-Op Procedure(s) (LRB): CANNULATED HIP PINNING (Left) awake alert oriented x4 sitting on the commode this morning. Pain is mild. Patient reports pain as 4 on 0-10 scale.    Objective: Vital signs in last 24 hours: Temp:  [97.3 F (36.3 C)-98.2 F (36.8 C)] 98 F (36.7 C) (06/25 0508) Pulse Rate:  [76-97] 87  (06/25 0508) Resp:  [13-21] 16  (06/25 0508) BP: (126-149)/(49-81) 126/74 mmHg (06/25 0508) SpO2:  [89 %-99 %] 94 % (06/25 0508) Weight:  [64.3 kg (141 lb 12.1 oz)-65.9 kg (145 lb 4.5 oz)] 64.3 kg (141 lb 12.1 oz) (06/25 0500)  Intake/Output from previous day: 06/24 0701 - 06/25 0700 In: 1390 [P.O.:240; I.V.:1150] Out: 2085 [Urine:2035; Blood:50] Intake/Output this shift: Total I/O In: 1026.3 [P.O.:240; I.V.:586.3; IV Piggyback:200] Out: -    Basename 11/07/11 1510  HGB 13.4    Basename 11/07/11 1510  WBC 9.8  RBC 4.35  HCT 40.2  PLT 237    Basename 11/07/11 1510  NA 136  K 3.8  CL 99  CO2 25  BUN 16  CREATININE 0.79  GLUCOSE 93  CALCIUM 9.5    Basename 11/08/11 0443 11/07/11 1510  LABPT -- --  INR 1.05 0.93    Neurologically intact ABD soft Neurovascular intact Sensation intact distally Intact pulses distally Dorsiflexion/Plantar flexion intact Incision: dressing C/D/I and no drainage No cellulitis present  Assessment/Plan: 1 Day Post-Op Procedure(s) (LRB): CANNULATED HIP PINNING (Left)  Advance diet Up with therapy D/C IV fluids Discharge to SNF placement.  Reannon Candella E 11/08/2011, 10:06 AM

## 2011-11-08 NOTE — Progress Notes (Signed)
Pt will discharge to SNF [facility], CSW following. mp

## 2011-11-08 NOTE — Evaluation (Signed)
Physical Therapy Evaluation Patient Details Name: Tina Hampton MRN: [id number] DOB: 1926/01/06 Today's Date: 11/08/2011 Time: 4098-1191 PT Time Calculation (min): 16 min  PT Assessment / Plan / Recommendation Clinical Impression  Pt s/p L hip pinning 2* fall resulting in femoral neck fx.  Pt would benefit from acute PT services in order to improve safety and independence with mobility while maintaining NWB on L LE.  Pt plans to hopefully d/c to [facility] where spouse resides to receive rehab prior to home.      PT Assessment  Patient needs continued PT services    Follow Up Recommendations  Skilled nursing facility    Barriers to Discharge        lEquipment Recommendations  Defer to next venue    Recommendations for Other Services     Frequency Min 4X/week    Precautions / Restrictions Precautions Precautions: Fall Restrictions Weight Bearing Restrictions: Yes LLE Weight Bearing: Non weight bearing   Pertinent Vitals/Pain Pt reports pain increased to 6/10 with L LE movement in L hip however decreases again with rest. (pt premedicated)      Mobility  Bed Mobility Bed Mobility: Supine to Sit Supine to Sit: 3: Mod assist;HOB elevated Details for Bed Mobility Assistance: assist for L LE and trunk upright Transfers Transfers: Sit to Stand;Stand to Sit;Stand Pivot Transfers Sit to Stand: 4: Min assist;From bed;From elevated surface Stand to Sit: 4: Min assist;To chair/3-in-1 Stand Pivot Transfers: 4: Min assist Details for Transfer Assistance: verbal cues for safe technique, reminded of NWB L LE and to use UE through RW, pt did touch down on L LE but reports no weight through LE    Exercises     PT Diagnosis: Difficulty walking;Acute pain  PT Problem List: Decreased strength;Decreased activity tolerance;Decreased mobility;Decreased knowledge of precautions;Decreased knowledge of use of DME;Pain PT Treatment Interventions: DME instruction;Gait training;Functional  mobility training;Therapeutic activities;Therapeutic exercise;Patient/family education;Wheelchair mobility training   PT Goals Acute Rehab PT Goals PT Goal Formulation: With patient Time For Goal Achievement: 11/15/11 Potential to Achieve Goals: Good Pt will go Supine/Side to Sit: with supervision PT Goal: Supine/Side to Sit - Progress: Goal set today Pt will go Sit to Supine/Side: with supervision PT Goal: Sit to Supine/Side - Progress: Goal set today Pt will go Sit to Stand: with supervision PT Goal: Sit to Stand - Progress: Goal set today Pt will go Stand to Sit: with supervision PT Goal: Stand to Sit - Progress: Goal set today Pt will Transfer Bed to Chair/Chair to Bed: with supervision PT Transfer Goal: Bed to Chair/Chair to Bed - Progress: Goal set today Pt will Ambulate: 1 - 15 feet;with min assist;with rolling walker PT Goal: Ambulate - Progress: Goal set today  Visit Information  Last PT Received On: 11/08/11 Assistance Needed: +2    Subjective Data  Subjective: "I need to try to have a BM."   Prior Functioning  Home Living Type of Home: Independent living facility Additional Comments: Pt plans to d/c to SNF. Prior Function Level of Independence: Independent Communication Communication: No difficulties    Cognition  Overall Cognitive Status: Appears within functional limits for tasks assessed/performed Arousal/Alertness: Awake/alert Orientation Level: Appears intact for tasks assessed Behavior During Session: [facility] for tasks performed    Extremity/Trunk Assessment Right Upper Extremity Assessment RUE ROM/Strength/Tone: [facility] for tasks assessed Left Upper Extremity Assessment LUE ROM/Strength/Tone: [facility] for tasks assessed Right Lower Extremity Assessment RLE ROM/Strength/Tone: [facility] for tasks assessed Left Lower Extremity Assessment LLE ROM/Strength/Tone: Deficits LLE ROM/Strength/Tone Deficits: requires  assist with bed mobility, decreased AROM against gravity     Balance    End of Session PT - End of Session Equipment Utilized During Treatment: Gait belt Activity Tolerance: Patient tolerated treatment well Patient left: with call bell/phone within reach;Other (comment) (pt requested time on [facility] for BM, nsg tech aware)   [name],[name] 11/08/2011, 11:09 AM Pager: [phone]

## 2011-11-08 NOTE — Progress Notes (Signed)
Pt c/o being very uncomfortable with constipation. Pt stated that she occasionally has do manually disimpact herself at home and that she felt that she would need to be disimpacted now. I did disimpact pt manually and got a moderate amount of hard, formed, brown stool removed from rectum. After disimpaction pt stated that she felt "so much better" and that she had relief. Pt informed that she will receive 200mg  colace at bedtime. Pt understood and states she will call if any further assistance needed. Will continue to monitor pt.   Arta Bruce [facility] 11/08/2011 6:40 PM

## 2011-11-08 NOTE — Progress Notes (Signed)
Physical Therapy Treatment Patient Details Name: Tina Hampton MRN: [id number] DOB: 06-20-1925 Today's Date: 11/08/2011 Time: 1191-4782 PT Time Calculation (min): 13 min  PT Assessment / Plan / Recommendation Comments on Treatment Session  Pt assisted off BSC and over to recliner.  Pt doing well with mobility and maintaining NWB status with occasional cues.    Follow Up Recommendations  Skilled nursing facility    Barriers to Discharge        Equipment Recommendations  Defer to next venue    Recommendations for Other Services    Frequency Min 4X/week   Plan Discharge plan remains appropriate;Frequency remains appropriate    Precautions / Restrictions Precautions Precautions: Fall Restrictions Weight Bearing Restrictions: Yes LLE Weight Bearing: Non weight bearing   Pertinent Vitals/Pain 4/10 L hip, repositioned, RN in room to give meds    Mobility  Bed Mobility Bed Mobility: Supine to Sit Supine to Sit: 3: Mod assist;HOB elevated Details for Bed Mobility Assistance: assist for L LE and trunk upright Transfers Transfers: Sit to Stand;Stand to Sit;Stand Pivot Transfers Sit to Stand: 4: Min assist;From chair/3-in-1 Stand to Sit: 4: Min assist;To chair/3-in-1 Stand Pivot Transfers: 4: Min assist Details for Transfer Assistance: assist to rise and steady, pt remembers NWB status, verbal cues for safe technique    Exercises Total Joint Exercises Ankle Circles/Pumps: AROM;Both;20 reps;Supine Quad Sets: AROM;Both;15 reps;Supine Gluteal Sets: AROM;Both;15 reps;Supine   PT Diagnosis: Difficulty walking;Acute pain  PT Problem List: Decreased strength;Decreased activity tolerance;Decreased mobility;Decreased knowledge of precautions;Decreased knowledge of use of DME;Pain PT Treatment Interventions: DME instruction;Gait training;Functional mobility training;Therapeutic activities;Therapeutic exercise;Patient/family education;Wheelchair mobility training   PT Goals Acute  Rehab PT Goals PT Goal Formulation: With patient Time For Goal Achievement: 11/15/11 Potential to Achieve Goals: Good Pt will go Supine/Side to Sit: with supervision PT Goal: Supine/Side to Sit - Progress: Goal set today Pt will go Sit to Supine/Side: with supervision PT Goal: Sit to Supine/Side - Progress: Goal set today Pt will go Sit to Stand: with supervision PT Goal: Sit to Stand - Progress: Progressing toward goal Pt will go Stand to Sit: with supervision PT Goal: Stand to Sit - Progress: Progressing toward goal Pt will Transfer Bed to Chair/Chair to Bed: with supervision PT Transfer Goal: Bed to Chair/Chair to Bed - Progress: Progressing toward goal Pt will Ambulate: 1 - 15 feet;with min assist;with rolling walker PT Goal: Ambulate - Progress: Goal set today  Visit Information  Last PT Received On: 11/08/11 Assistance Needed: +2    Subjective Data  Subjective: "I know, no weight."  (with transfer)   Cognition  Overall Cognitive Status: Appears within functional limits for tasks assessed/performed Arousal/Alertness: Awake/alert Orientation Level: Appears intact for tasks assessed Behavior During Session: [facility] for tasks performed    Balance     End of Session PT - End of Session Equipment Utilized During Treatment: Gait belt Activity Tolerance: Patient tolerated treatment well Patient left: in chair;with call bell/phone within reach;with nursing in room    Shaconda Hajduk,[name] 11/08/2011, 12:05 PM Pager: [phone]

## 2011-11-08 NOTE — Progress Notes (Signed)
ANTICOAGULATION CONSULT NOTE - Follow Up Consult  Pharmacy Consult for Warfarin Indication: VTE prophylaxis  Allergies  Allergen Reactions  . Penicillins Other (See Comments)    CHILDHOOD REACTION    Patient Measurements: Height: 5\' 4"  (162.6 cm) Weight: 141 lb 12.1 oz (64.3 kg) IBW/kg (Calculated) : 54.7   Vital Signs: Temp: 98 F (36.7 C) (06/25 0508) Temp src: Oral (06/25 0508) BP: 126/74 mmHg (06/25 0508) Pulse Rate: 87  (06/25 0508)  Labs:  Basename 11/08/11 0443 11/07/11 1510  HGB -- 13.4  HCT -- 40.2  PLT -- 237  APTT -- --  LABPROT 13.9 12.7  INR 1.05 0.93  HEPARINUNFRC -- --  CREATININE -- 0.79  CKTOTAL -- --  CKMB -- --  TROPONINI -- --    Estimated Creatinine Clearance: 45.2 ml/min (by C-G formula based on Cr of 0.79).    Assessment: 78 yo F s/p cannulated hip pinning for L hip fracture. Started warfarin 6/24 for DVT prophylaxis.  Baseline CBC and PT/INR wnl.  Goal of Therapy:  INR 2-3   Plan:  Warfarin 5 mg once today. Follow up INR in AM.   Clance Boll 11/08/2011,10:53 AM

## 2011-11-08 NOTE — Clinical Social Work Placement (Unsigned)
     Clinical Social Work Department CLINICAL SOCIAL WORK PLACEMENT NOTE 11/08/2011  Patient:  Tina Hampton, Tina Hampton  Account Number:  [id number] Admit date:  11/07/2011  Clinical Social Worker:  Becky Sax, LCSW  Date/time:  11/08/2011 12:00 M  Clinical Social Work is seeking post-discharge placement for this patient at the following level of care:   SKILLED NURSING   (*CSW will update this form in Epic as items are completed)   11/08/2011  Patient/family provided with [facility] Department of Clinical Social Works list of facilities offering this level of care within the geographic area requested by the patient (or if unable, by the patients family).  11/08/2011  Patient/family informed of their freedom to choose among providers that offer the needed level of care, that participate in Medicare, Medicaid or managed care program needed by the patient, have an available bed and are willing to accept the patient.  11/08/2011  Patient/family informed of [facility] ownership interest in [facility], as well as of the fact that they are under no obligation to receive care at this facility.  PASARR submitted to EDS on 11/08/2011 PASARR number received from EDS on 11/08/2011  FL2 transmitted to all facilities in geographic area requested by pt/family on  11/08/2011 FL2 transmitted to all facilities within larger geographic area on   Patient informed that his/her managed care company has contracts with or will negotiate with  certain facilities, including the following:     Patient/family informed of bed offers received:  11/08/2011 Patient chooses bed at [facility] Physician recommends and patient chooses bed at    Patient to be transferred to  on   Patient to be transferred to facility by   The following physician request were entered in Epic:   Additional Comments:

## 2011-11-08 NOTE — Progress Notes (Signed)
[facility]                                                                                Patient Demographics  Tina Hampton, is a 78 y.o. female  WUJ:[id number]  NFA:[id number]  DOB - August 16, 1925  Admit date - 11/07/2011  Admitting Physician Leroy Sea, MD  Outpatient Primary MD for the patient is [name], [name], MD  LOS - 1   Chief Complaint  Patient presents with  . Fall  . Hip Pain        Subjective:   [name] today has, No headache, No chest pain, No abdominal pain - No Nausea, No new weakness tingling or numbness, No Cough - SOB.   Objective:   Filed Vitals:   11/08/11 0300 11/08/11 0400 11/08/11 0500 11/08/11 0508  BP:    126/74  Pulse:    87  Temp:    98 F (36.7 C)  TempSrc:    Oral  Resp:  16  16  Height:      Weight:   64.3 kg (141 lb 12.1 oz)   SpO2: 94% 97%  94%    Wt Readings from Last 3 Encounters:  11/08/11 64.3 kg (141 lb 12.1 oz)  11/08/11 64.3 kg (141 lb 12.1 oz)     Intake/Output Summary (Last 24 hours) at 11/08/11 1105 Last data filed at 11/08/11 0709  Gross per 24 hour  Intake 2416.25 ml  Output   2085 ml  Net 331.25 ml    Exam Awake Alert, Oriented *3, No new F.N deficits, Normal affect [place].AT,PERRAL Supple Neck,No JVD, No cervical lymphadenopathy appriciated.  Symmetrical Chest wall movement, Good air movement bilaterally, CTAB RRR,No Gallops,Rubs or new Murmurs, No Parasternal Heave +ve B.Sounds, Abd Soft, Non tender, No organomegaly appriciated, No rebound -guarding or rigidity. No Cyanosis, Clubbing or edema, No new Rash or bruise       Data Review  CBC  Lab 11/07/11 1510  WBC 9.8  HGB 13.4  HCT 40.2  PLT 237  MCV 92.4  MCH 30.8  MCHC 33.3  RDW 12.4  LYMPHSABS 1.1  MONOABS 0.7  EOSABS 0.1  BASOSABS 0.0  BANDABS --    Chemistries   Lab 11/07/11 1510  NA 136  K 3.8  CL 99  CO2 25  GLUCOSE 93  BUN 16  CREATININE 0.79  CALCIUM 9.5  MG --  AST 31  ALT 22    ALKPHOS 61  BILITOT 0.3   ------------------------------------------------------------------------------------------------------------------ estimated creatinine clearance is 45.2 ml/min (by C-G formula based on Cr of 0.79). ------------------------------------------------------------------------------------------------------------------ No results found for this basename: HGBA1C:2 in the last 72 hours ------------------------------------------------------------------------------------------------------------------ No results found for this basename: CHOL:2,HDL:2,LDLCALC:2,TRIG:2,CHOLHDL:2,LDLDIRECT:2 in the last 72 hours ------------------------------------------------------------------------------------------------------------------ No results found for this basename: TSH,T4TOTAL,FREET3,T3FREE,THYROIDAB in the last 72 hours ------------------------------------------------------------------------------------------------------------------ No results found for this basename: VITAMINB12:2,FOLATE:2,FERRITIN:2,TIBC:2,IRON:2,RETICCTPCT:2 in the last 72 hours  Coagulation profile  Lab 11/08/11 0443 11/07/11 1510  INR 1.05 0.93  PROTIME -- --    No results found for this basename: DDIMER:2 in the last 72 hours  Cardiac Enzymes No results found for this basename: CK:3,CKMB:3,TROPONINI:3,MYOGLOBIN:3 in the last 168 hours ------------------------------------------------------------------------------------------------------------------ No components found  with this basename: POCBNP:3  Micro Results No results found for this or any previous visit (from the past 240 hour(s)).  Radiology Reports Dg Wrist Complete Left  11/07/2011  *RADIOLOGY REPORT*  Clinical Data: Fall, left hip pain  LEFT WRIST - COMPLETE 3+ VIEW  Comparison: None.  Findings: Four views of the left wrist submitted.  No acute fracture or subluxation.  There is diffuse osteopenia. Degenerative changes are noted first carpal  metacarpal joint.  IMPRESSION: Diffuse osteopenia.  No acute fracture or subluxation.  Generative changes first carpal metacarpal joint.  Original Report Authenticated By: Natasha Mead, M.D.   Dg Hip Complete Left  11/07/2011  *RADIOLOGY REPORT*  Clinical Data: Fall, left hip pain.  LEFT HIP - COMPLETE 2+ VIEW  Comparison: None  Findings: Degenerative joint disease changes in the hips bilaterally.  Severe rightward scoliosis in the lumbar spine with associated degenerative changes.  There is angulation in the lateral left femoral neck region.  This is concerning for slightly impacted left femoral neck fracture.  No dislocation.  Diffuse osteopenia.  IMPRESSION: Angulation of the cortex laterally in the subcapital left femoral neck concerning for slightly impacted left femoral neck fracture.  Original Report Authenticated By: Cyndie Chime, M.D.   Dg Hip Operative Left  11/07/2011  *RADIOLOGY REPORT*  Clinical Data: 78 year old female status post left hip ORIF.  OPERATIVE LEFT HIP  Comparison: Preoperative study 11/07/2011.  Findings: Two intraoperative fluoroscopic views of the proximal left femur.  Three cannulated screws traverse the subcapital left proximal femur fracture with near anatomic alignment.  Fluoroscopy time of 0.9 minutes was utilized.  IMPRESSION: Left femoral neck fracture ORIF with no adverse features.  Original Report Authenticated By: Harley Hallmark, M.D.   Dg Chest Port 1 View  11/07/2011  *RADIOLOGY REPORT*  Clinical Data: Cough.  PORTABLE CHEST - 1 VIEW  Comparison: None.  Findings: Heart is borderline in size.  No confluent airspace opacity or effusion.  No acute bony abnormality. Mild hyperinflation.  IMPRESSION: No acute findings.  Original Report Authenticated By: Cyndie Chime, M.D.    Scheduled Meds:   . buPROPion  450 mg Oral Daily  . calcium-vitamin D  1 tablet Oral Daily  . cholecalciferol  1,000 Units Oral Daily  . conjugated estrogens  1 g Vaginal Custom  . coumadin  book   Does not apply Once  . docusate sodium  200 mg Oral QHS  . enoxaparin  30 mg Subcutaneous Q12H  . ezetimibe-simvastatin  1 tablet Oral QHS  . famotidine  20 mg Oral Daily  . FLUoxetine  40 mg Oral Daily  .  morphine injection  4 mg Intravenous Once  . multivitamin with minerals  1 tablet Oral Daily  . NIFEdipine  60 mg Oral Daily  . omega-3 acid ethyl esters  1 g Oral BID  . polyvinyl alcohol  1 drop Both Eyes BID  . sodium chloride  3 mL Intravenous Q12H  . vancomycin  1,000 mg Intravenous Q12H  . warfarin  5 mg Oral Once  . warfarin  5 mg Oral ONCE-1800  . warfarin   Does not apply Once  . Warfarin - Pharmacist Dosing Inpatient   Does not apply q1800  . DISCONTD: dextrose 5 % and 0.45% NaCl   Intravenous STAT  . DISCONTD: Fish Oil  1,200 mg Oral BID  . DISCONTD: Glucosamine HCl  2,000 mg Oral BID  . DISCONTD: magnesium sulfate 1 - 4 g bolus IVPB  2 g Intravenous  Once  . DISCONTD: metoprolol tartrate  25 mg Oral BID  . DISCONTD: Polyethyl Glycol-Propyl Glycol  1 drop Ophthalmic BID   Continuous Infusions:   . DISCONTD: sodium chloride    . DISCONTD: dextrose 5 % and 0.45 % NaCl with KCl 20 mEq/L 75 mL/hr at 11/07/11 2320   PRN Meds:.acetaminophen, acetaminophen, albuterol, clonazePAM, guaiFENesin-dextromethorphan, HYDROcodone-acetaminophen, menthol-cetylpyridinium, morphine injection, ondansetron (ZOFRAN) IV, phenol, DISCONTD: HYDROcodone-acetaminophen, DISCONTD: HYDROcodone-acetaminophen, DISCONTD:  HYDROmorphone (DILAUDID) injection, DISCONTD: metoCLOPramide (REGLAN) injection, DISCONTD: metoCLOPramide, DISCONTD: metoprolol, DISCONTD:  morphine injection DISCONTD:  morphine injection, DISCONTD: ondansetron (ZOFRAN) IV, DISCONTD: ondansetron (ZOFRAN) IV, DISCONTD: ondansetron, DISCONTD: ondansetron, DISCONTD: promethazine  Assessment & Plan    1. Mechanical fall causing left impacted femoral neck fracture- agent admitted on 11/07/2011 who underwent open reduction internal  fixation by orthopedics surgeon Dr. Otelia Sergeant, on 11/07/2011 - she is now day 1 postop and is feeling good, PT OT to see the patient, local wound care per nursing staff and orthopedics. Her postop blood work was accidentally D/C'd in the OR, will order H&H and BMP now and monitor.    She is on Lovenox and Coumadin for DVT prophylaxis per orthopedics, [place] to be stopped once INR is 1.8 per orthopedics. Social work requested to see the patient for appropriate placement. She is on postop vancomycin per orthopedics for infection prophylaxis will defer it to them.     2. GERD and hiatal hernia - continue Pepcid per home dose no acute issues.     3.Hypertension and dyslipidemia - continue home medications which are nifedipine and Vytorin and monitor.     4.Please follow urine cultures      DVT Prophylaxis  Lovenox/Couamdin    Procedures - open reduction internal fixation by orthopedics surgeon Dr. Otelia Sergeant, on 11/07/2011   Consults  - Orthopedics Dr Otelia Sergeant, PT-OT, S Work     Leroy Sea M.D on 11/08/2011 at 11:05 AM  Between 7am to 7pm - Pager - [phone]  After 7pm go to [email or web address] - password [email or web address]  And look for the night coverage person covering for me after hours  [facility] Office  [phone]

## 2011-11-09 DIAGNOSIS — I10 Essential (primary) hypertension: Secondary | ICD-10-CM

## 2011-11-09 DIAGNOSIS — M159 Polyosteoarthritis, unspecified: Secondary | ICD-10-CM

## 2011-11-09 DIAGNOSIS — S72009A Fracture of unspecified part of neck of unspecified femur, initial encounter for closed fracture: Secondary | ICD-10-CM

## 2011-11-09 DIAGNOSIS — E782 Mixed hyperlipidemia: Secondary | ICD-10-CM

## 2011-11-09 LAB — URINE CULTURE

## 2011-11-09 LAB — CBC
HCT: 35.2 % — ABNORMAL LOW (ref 36.0–46.0)
MCV: 92.6 fL (ref 78.0–100.0)
RBC: 3.8 MIL/uL — ABNORMAL LOW (ref 3.87–5.11)
WBC: 8.3 10*3/uL (ref 4.0–10.5)

## 2011-11-09 LAB — PROTIME-INR: Prothrombin Time: 14.2 seconds (ref 11.6–15.2)

## 2011-11-09 LAB — BASIC METABOLIC PANEL
BUN: 13 mg/dL (ref 6–23)
CO2: 26 mEq/L (ref 19–32)
Chloride: 101 mEq/L (ref 96–112)
Creatinine, Ser: 0.78 mg/dL (ref 0.50–1.10)
Potassium: 3.7 mEq/L (ref 3.5–5.1)

## 2011-11-09 MED ORDER — ALBUTEROL SULFATE (5 MG/ML) 0.5% IN NEBU
2.5000 mg | INHALATION_SOLUTION | RESPIRATORY_TRACT | Status: AC
  Administered 2011-11-09 – 2011-11-10 (×4): 2.5 mg via RESPIRATORY_TRACT
  Filled 2011-11-09 (×4): qty 0.5

## 2011-11-09 MED ORDER — WARFARIN SODIUM 7.5 MG PO TABS
7.5000 mg | ORAL_TABLET | Freq: Once | ORAL | Status: AC
  Administered 2011-11-09: 7.5 mg via ORAL
  Filled 2011-11-09: qty 1

## 2011-11-09 MED ORDER — SORBITOL 70 % SOLN
20.0000 mL | Freq: Every day | Status: DC | PRN
  Administered 2011-11-09: 20 mL via ORAL
  Filled 2011-11-09: qty 30

## 2011-11-09 NOTE — Progress Notes (Addendum)
Subjective: 2 Days Post-Op Procedure(s) (LRB): CANNULATED HIP PINNING (Left) this patient's awake alert oriented x4. She reports difficulty in ambulating any distance and really only transfers bed to chair and bed to wheelchair with a walker nonweightbearing on the right lower extremity. Nonweightbearing status will be maintained for at least 6 weeks. He is on Coumadin. Patient reports pain as 4 on 0-10 scale.    Objective: Vital signs in last 24 hours: Temp:  [98.4 F (36.9 C)-98.7 F (37.1 C)] 98.7 F (37.1 C) (06/26 1325) Pulse Rate:  [78-86] 80  (06/26 1325) Resp:  [16-20] 20  (06/26 1325) BP: (103-126)/(64-69) 103/64 mmHg (06/26 1325) SpO2:  [87 %-95 %] 95 % (06/26 1635) Weight:  [80.9 kg (178 lb 5.6 oz)] 80.9 kg (178 lb 5.6 oz) (06/26 0551)  Intake/Output from previous day: 06/25 0701 - 06/26 0700 In: 1746.3 [P.O.:960; I.V.:586.3; IV Piggyback:200] Out: 950 [Urine:950] Intake/Output this shift:     Basename 11/09/11 0443 11/08/11 1150 11/07/11 1510  HGB 11.6* 12.1 13.4    Basename 11/09/11 0443 11/08/11 1150 11/07/11 1510  WBC 8.3 -- 9.8  RBC 3.80* -- 4.35  HCT 35.2* 37.2 --  PLT 169 -- 237    Basename 11/09/11 0443 11/08/11 1151  NA 134* 134*  K 3.7 3.9  CL 101 100  CO2 26 25  BUN 13 14  CREATININE 0.78 0.96  GLUCOSE 126* 149*  CALCIUM 8.7 8.9    Basename 11/09/11 0443 11/08/11 0443  LABPT -- --  INR 1.08 1.05    Neurologically intact ABD soft Sensation intact distally Dorsiflexion/Plantar flexion intact Incision: dressing C/D/I and no drainage  Assessment/Plan: 2 Days Post-Op Procedure(s) (LRB): CANNULATED HIP PINNING (Left) Desaturation went off O2 per nasal cannula. May be secondary to atelectasis possibly fluid overload.  Advance diet Up with therapy Discharge to SNF when medically cleared. Incentive spirometry has been ordered and I will also place her on every 4 hours Proventil inhaler for 4 doses. Riyan Haile E 11/09/2011, 7:38 PM

## 2011-11-09 NOTE — Evaluation (Signed)
Occupational Therapy Evaluation Patient Details Name: Tina Hampton MRN: [id number] DOB: 10-13-1925 Today's Date: 11/09/2011 Time: 7829-5621 OT Time Calculation (min): 30 min  OT Assessment / Plan / Recommendation Clinical Impression  Pt is s/p IM nailing after a L hip fracture. Displays decreased strength and independence with functional mobilty and ADL. Will benefit from skilled OT services to improve independence with ADL tasks for next venue of care.     OT Assessment  Patient needs continued OT Services    Follow Up Recommendations  Skilled nursing facility    Barriers to Discharge      Equipment Recommendations  Defer to next venue    Recommendations for Other Services    Frequency  Min 1X/week    Precautions / Restrictions Precautions Precautions: Fall Restrictions Weight Bearing Restrictions: Yes LLE Weight Bearing: Non weight bearing        ADL  Eating/Feeding: Simulated;Independent Where Assessed - Eating/Feeding: Bed level Grooming: Simulated;Wash/dry hands;Set up Where Assessed - Grooming: Unsupported sitting Upper Body Bathing: Simulated;Chest;Right arm;Left arm;Abdomen;Set up Where Assessed - Upper Body Bathing: Unsupported sitting Lower Body Bathing: Simulated;Moderate assistance Where Assessed - Lower Body Bathing: Supported sit to stand Upper Body Dressing: Simulated;Set up Where Assessed - Upper Body Dressing: Unsupported sitting Lower Body Dressing: Simulated;Maximal assistance Where Assessed - Lower Body Dressing: Supported sit to stand Toilet Transfer: Performed;Minimal assistance Toilet Transfer Method: Surveyor, minerals: Materials engineer and Hygiene: Simulated;+1 Total assistance -needs UE support on RW currently to maintain balance and NWB Where Assessed - Glass blower/designer Manipulation and Hygiene: Standing Tub/Shower Transfer Method: Not assessed Equipment Used: Rolling walker ADL  Comments: Discussed use of AE until pt able to bend forward or bring L LE up to her. Pt interested in practicing with AE. Pivoted over to [facility] to the right from EOB and then chair pulled up behind her. Pt with some difficulty maintaining NWB initially (did feel slight pressure when pt rested her foot on OT's and turned).  Then as she was up more, she was able to pivot/turn R foot toward BSC but not able to take weight through UEs  and "hop" yet.     OT Diagnosis: Generalized weakness  OT Problem List: Decreased strength;Decreased knowledge of use of DME or AE;Pain OT Treatment Interventions: Self-care/ADL training;Therapeutic activities;DME and/or AE instruction;Patient/family education   OT Goals Acute Rehab OT Goals OT Goal Formulation: With patient Time For Goal Achievement: 11/16/11 Potential to Achieve Goals: Good ADL Goals Pt Will Perform Lower Body Bathing: with min assist;Sit to stand from chair;Sit to stand from bed;with adaptive equipment ADL Goal: Lower Body Bathing - Progress: Goal set today Pt Will Perform Lower Body Dressing: with mod assist;Sit to stand from chair;Sit to stand from bed;with adaptive equipment ADL Goal: Lower Body Dressing - Progress: Goal set today Pt Will Transfer to Toilet: with min assist;Stand pivot transfer;3-in-1;Other (comment) (min guard assist maintaining NWB) ADL Goal: Toilet Transfer - Progress: Goal set today Pt Will Perform Toileting - Clothing Manipulation: with min assist ADL Goal: Toileting - Clothing Manipulation - Progress: Goal set today Additional ADL Goal #1: Pt will transfer to EOB with min assist in prep for ADL. ADL Goal: Additional Goal #1 - Progress: Goal set today  Visit Information  Last OT Received On: 11/09/11 Assistance Needed: +1    Subjective Data  Subjective: I need to use the potty Patient Stated Goal: to go to rehab and eventually return to I living   Prior Functioning  Home Living Available Help at Discharge: Skilled  Nursing Facility Type of Home: Independent living facility Additional Comments: Pt plans to d/c to SNF. Prior Function Level of Independence: Independent Communication Communication: No difficulties    Cognition  Overall Cognitive Status: Appears within functional limits for tasks assessed/performed Arousal/Alertness: Awake/alert Orientation Level: Appears intact for tasks assessed Behavior During Session: [facility] for tasks performed    Extremity/Trunk Assessment Right Upper Extremity Assessment RUE ROM/Strength/Tone: [facility] for tasks assessed Left Upper Extremity Assessment LUE ROM/Strength/Tone: [facility] for tasks assessed   Mobility Bed Mobility Bed Mobility: Supine to Sit Supine to Sit: HOB elevated;With rails;3: Mod assist Transfers Transfers: Sit to Stand;Stand to Sit Sit to Stand: 4: Min assist;With upper extremity assist;From bed Stand to Sit: 4: Min assist;With upper extremity assist;To chair/3-in-1 Details for Transfer Assistance: verbal cues for technique and hand placement      Balance    End of Session OT - End of Session Equipment Utilized During Treatment: Gait belt Activity Tolerance: Patient tolerated treatment well Patient left: in chair;with call bell/phone within reach       Lennox Laity [phone] 11/09/2011, 12:20 PM

## 2011-11-09 NOTE — Progress Notes (Signed)
Physical Therapy Treatment Patient Details Name: Tina Hampton MRN: [id number] DOB: 29-Mar-1926 Today's Date: 11/09/2011 Time: 1324-4010 PT Time Calculation (min): 22 min  PT Assessment / Plan / Recommendation Comments on Treatment Session  Pt attempted short distance ambulation however unable to hop well so discussed w/c for mobility with next visit.  Pt does well with maintaining NWB with occasional verbal cues.  Pt also performed exercises.    Follow Up Recommendations  Skilled nursing facility    Barriers to Discharge        Equipment Recommendations  Defer to next venue    Recommendations for Other Services    Frequency     Plan Discharge plan remains appropriate;Frequency remains appropriate    Precautions / Restrictions Precautions Precautions: Fall Restrictions Weight Bearing Restrictions: Yes LLE Weight Bearing: Non weight bearing   Pertinent Vitals/Pain 2/10 sore L hip, premedicated    Mobility  Transfers Transfers: Sit to Stand;Stand to Sit Sit to Stand: 4: Min assist;From chair/3-in-1;With upper extremity assist Stand to Sit: 4: Min assist;To chair/3-in-1;With upper extremity assist Details for Transfer Assistance: performed x2 for strengthening and instruction, verbal cues for safe technique, L LE forward to maintain NWB Ambulation/Gait Ambulation/Gait Assistance: 4: Min assist Ambulation Distance (Feet): 3 Feet Assistive device: Rolling walker Ambulation/Gait Assistance Details: pt attempted ambulation with RW but reports upper body not strong enough to assist with hopping (maintained NWB on L LE), discussed use of w/c with next visit to improve mobility    Exercises General Exercises - Lower Extremity Long Arc Quad: AROM;Strengthening;Both;10 reps;Seated Hip ABduction/ADduction: AROM;Strengthening;Both;10 reps;Supine;Limitations Hip Abduction/Adduction Limitations: L LE less AROM than R Hip Flexion/Marching: AROM;Strengthening;Both;10  reps;Seated;Limitations Hip Flexion/Marching Limitations: L LE less AROM than R   PT Diagnosis:    PT Problem List:   PT Treatment Interventions:     PT Goals Acute Rehab PT Goals PT Goal: Sit to Stand - Progress: Progressing toward goal PT Goal: Stand to Sit - Progress: Progressing toward goal PT Goal: Ambulate - Progress: Discontinued (comment) Pt will Propel Wheelchair: 51 - 150 feet;with supervision PT Goal: Propel Wheelchair - Progress: Goal set today  Visit Information  Last PT Received On: 11/09/11 Assistance Needed: +1    Subjective Data  Subjective: "my upper body is just so sore from yesterday."   Cognition  Overall Cognitive Status: Appears within functional limits for tasks assessed/performed    Balance     End of Session PT - End of Session Equipment Utilized During Treatment: Gait belt Activity Tolerance: Patient tolerated treatment well Patient left: in chair;with call bell/phone within reach;with family/visitor present   GP     Tina Hampton,Tina Hampton 11/09/2011, 11:56 AM Pager: [phone]

## 2011-11-09 NOTE — Progress Notes (Signed)
SATURATION QUALIFICATIONS:  Patient Saturations on Room Air at Rest = 91%  Patient Saturations on Room Air while Ambulating = 87%  Patient Saturations on 2 Liters of oxygen while Ambulating =  95%

## 2011-11-09 NOTE — Progress Notes (Addendum)
ANTICOAGULATION CONSULT NOTE - Follow Up Consult  Pharmacy Consult for Warfarin Indication: VTE prophylaxis  Allergies  Allergen Reactions  . Penicillins Other (See Comments)    CHILDHOOD REACTION    Patient Measurements: Height: 5\' 4"  (162.6 cm) Weight: 178 lb 5.6 oz (80.9 kg) IBW/kg (Calculated) : 54.7   Vital Signs: Temp: 98.6 F (37 C) (06/26 0551) Temp src: Oral (06/26 0551) BP: 126/69 mmHg (06/26 0551) Pulse Rate: 78  (06/26 0551)  Labs:  Basename 11/09/11 0443 11/08/11 1151 11/08/11 1150 11/08/11 0443 11/07/11 1510  HGB 11.6* -- 12.1 -- --  HCT 35.2* -- 37.2 -- 40.2  PLT 169 -- -- -- 237  APTT -- -- -- -- --  LABPROT 14.2 -- -- 13.9 12.7  INR 1.08 -- -- 1.05 0.93  HEPARINUNFRC -- -- -- -- --  CREATININE 0.78 0.96 -- -- 0.79  CKTOTAL -- -- -- -- --  CKMB -- -- -- -- --  TROPONINI -- -- -- -- --    Estimated Creatinine Clearance: 53.9 ml/min (by C-G formula based on Cr of 0.78).    Assessment: 78 yo F s/p cannulated hip pinning for L hip fracture. Started warfarin 6/24 for DVT prophylaxis.  No bleeding/complications reported.  Hgb lower today, will monitor.  Goal of Therapy:  INR 2-3   Plan:  Warfarin 7.5 mg once today. Follow up INR in AM.   Clance Boll 11/09/2011,9:03 AM  Addendum: 11/09/2011 10:25 AM Educated patient on the safety and use of warfarin.  Patient was pleasant and expressed a good understanding of the expectations of warfarin.  Clance Boll, PharmD, BCPS Pager: [phone] 11/09/2011 10:25 AM

## 2011-11-09 NOTE — Progress Notes (Signed)
[facility]                                                                                Patient Demographics  Tina Hampton, is a 78 y.o. female  BJY:[id number]  YQM:[id number]  DOB - 29-Aug-1925  Admit date - 11/07/2011  Admitting Physician No admitting provider for patient encounter.  Outpatient Primary MD for the patient is GREEN, [name], MD  LOS - 2   Chief Complaint  Patient presents with  . Fall  . Hip Pain        Subjective:   Tina Hampton today has, No headache, No chest pain, No abdominal pain - No Nausea, No new weakness tingling or numbness, No Cough - SOB-does have some moderate constipation.  Nursing relates that she dropped her O2 sats to the mid 80's yesterday.  She has never had an O2 requirement in the past  Reports some hoarseness   Objective:   Filed Vitals:   11/08/11 2202 11/09/11 0200 11/09/11 0551 11/09/11 1325  BP: 120/68 121/66 126/69 103/64  Pulse: 82 86 78 80  Temp: 98.7 F (37.1 C) 98.4 F (36.9 C) 98.6 F (37 C) 98.7 F (37.1 C)  TempSrc: Oral Oral Oral Oral  Resp: 16 20 16 20   Height:      Weight:   80.9 kg (178 lb 5.6 oz)   SpO2: 95% 94% 92% 95%    Wt Readings from Last 3 Encounters:  11/09/11 80.9 kg (178 lb 5.6 oz)  11/09/11 80.9 kg (178 lb 5.6 oz)     Intake/Output Summary (Last 24 hours) at 11/09/11 1552 Last data filed at 11/09/11 1300  Gross per 24 hour  Intake   1080 ml  Output   1550 ml  Net   -470 ml    Exam Awake Alert, Oriented *3, No new F.N deficits, Normal affect [place].AT,PERRAL Supple Neck,No JVD, No cervical lymphadenopathy appriciated.  Symmetrical Chest wall movement, Good air movement bilaterally, some scattered crackles in R lower chest area RRR,No Gallops,Rubs or new Murmurs, No Parasternal Heave +ve B.Sounds, Abd Soft, Non tender, No organomegaly appriciated, No rebound -guarding or rigidity. No Cyanosis, Clubbing or edema, No new Rash or bruise       Data Review    CBC  Lab 11/09/11 0443 11/08/11 1150 11/07/11 1510  WBC 8.3 -- 9.8  HGB 11.6* 12.1 13.4  HCT 35.2* 37.2 40.2  PLT 169 -- 237  MCV 92.6 -- 92.4  MCH 30.5 -- 30.8  MCHC 33.0 -- 33.3  RDW 12.8 -- 12.4  LYMPHSABS -- -- 1.1  MONOABS -- -- 0.7  EOSABS -- -- 0.1  BASOSABS -- -- 0.0  BANDABS -- -- --    Chemistries   Lab 11/09/11 0443 11/08/11 1151 11/07/11 1510  NA 134* 134* 136  K 3.7 3.9 3.8  CL 101 100 99  CO2 26 25 25   GLUCOSE 126* 149* 93  BUN 13 14 16   CREATININE 0.78 0.96 0.79  CALCIUM 8.7 8.9 9.5  MG -- -- --  AST -- -- 31  ALT -- -- 22  ALKPHOS -- -- 61  BILITOT -- -- 0.3   ------------------------------------------------------------------------------------------------------------------ estimated creatinine  clearance is 53.9 ml/min (by C-G formula based on Cr of 0.78). ------------------------------------------------------------------------------------------------------------------ No results found for this basename: HGBA1C:2 in the last 72 hours ------------------------------------------------------------------------------------------------------------------ No results found for this basename: CHOL:2,HDL:2,LDLCALC:2,TRIG:2,CHOLHDL:2,LDLDIRECT:2 in the last 72 hours ------------------------------------------------------------------------------------------------------------------ No results found for this basename: TSH,T4TOTAL,FREET3,T3FREE,THYROIDAB in the last 72 hours ------------------------------------------------------------------------------------------------------------------ No results found for this basename: VITAMINB12:2,FOLATE:2,FERRITIN:2,TIBC:2,IRON:2,RETICCTPCT:2 in the last 72 hours  Coagulation profile  Lab 11/09/11 0443 11/08/11 0443 11/07/11 1510  INR 1.08 1.05 0.93  PROTIME -- -- --    No results found for this basename: DDIMER:2 in the last 72 hours  Cardiac Enzymes No results found for this basename: CK:3,CKMB:3,TROPONINI:3,MYOGLOBIN:3  in the last 168 hours ------------------------------------------------------------------------------------------------------------------ No components found with this basename: POCBNP:3  Micro Results Recent Results (from the past 240 hour(s))  URINE CULTURE     Status: Normal (Preliminary result)   Collection Time   11/07/11  6:44 PM      Component Value Range Status Comment   Specimen Description URINE, CATHETERIZED   Final    Special Requests none   Final    Culture  Setup Time [id number]   Final    Colony Count >=100,000 COLONIES/ML   Final    Culture GRAM NEGATIVE RODS   Final    Report Status PENDING   Incomplete     Radiology Reports Dg Wrist Complete Left  11/07/2011  *RADIOLOGY REPORT*  Clinical Data: Fall, left hip pain  LEFT WRIST - COMPLETE 3+ VIEW  Comparison: None.  Findings: Four views of the left wrist submitted.  No acute fracture or subluxation.  There is diffuse osteopenia. Degenerative changes are noted first carpal metacarpal joint.  IMPRESSION: Diffuse osteopenia.  No acute fracture or subluxation.  Generative changes first carpal metacarpal joint.  Original Report Authenticated By: Natasha Mead, M.D.   Dg Hip Complete Left  11/07/2011  *RADIOLOGY REPORT*  Clinical Data: Fall, left hip pain.  LEFT HIP - COMPLETE 2+ VIEW  Comparison: None  Findings: Degenerative joint disease changes in the hips bilaterally.  Severe rightward scoliosis in the lumbar spine with associated degenerative changes.  There is angulation in the lateral left femoral neck region.  This is concerning for slightly impacted left femoral neck fracture.  No dislocation.  Diffuse osteopenia.  IMPRESSION: Angulation of the cortex laterally in the subcapital left femoral neck concerning for slightly impacted left femoral neck fracture.  Original Report Authenticated By: Cyndie Chime, M.D.   Dg Hip Operative Left  11/07/2011  *RADIOLOGY REPORT*  Clinical Data: 78 year old female status post left hip ORIF.   OPERATIVE LEFT HIP  Comparison: Preoperative study 11/07/2011.  Findings: Two intraoperative fluoroscopic views of the proximal left femur.  Three cannulated screws traverse the subcapital left proximal femur fracture with near anatomic alignment.  Fluoroscopy time of 0.9 minutes was utilized.  IMPRESSION: Left femoral neck fracture ORIF with no adverse features.  Original Report Authenticated By: Harley Hallmark, M.D.   Dg Chest Port 1 View  11/07/2011  *RADIOLOGY REPORT*  Clinical Data: Cough.  PORTABLE CHEST - 1 VIEW  Comparison: None.  Findings: Heart is borderline in size.  No confluent airspace opacity or effusion.  No acute bony abnormality. Mild hyperinflation.  IMPRESSION: No acute findings.  Original Report Authenticated By: Cyndie Chime, M.D.    Scheduled Meds:    . buPROPion  450 mg Oral Daily  . calcium-vitamin D  1 tablet Oral Daily  . cholecalciferol  1,000 Units Oral Daily  . conjugated estrogens  1 g  Vaginal Custom  . coumadin book   Does not apply Once  . docusate sodium  200 mg Oral QHS  . enoxaparin  30 mg Subcutaneous Q12H  . ezetimibe-simvastatin  1 tablet Oral QHS  . famotidine  20 mg Oral Daily  . FLUoxetine  40 mg Oral Daily  . multivitamin with minerals  1 tablet Oral Daily  . NIFEdipine  60 mg Oral Daily  . omega-3 acid ethyl esters  1 g Oral BID  . polyvinyl alcohol  1 drop Both Eyes BID  . sodium chloride  3 mL Intravenous Q12H  . warfarin  5 mg Oral ONCE-1800  . warfarin  7.5 mg Oral ONCE-1800  . warfarin   Does not apply Once  . Warfarin - Pharmacist Dosing Inpatient   Does not apply q1800   Continuous Infusions:  PRN Meds:.acetaminophen, acetaminophen, albuterol, clonazePAM, guaiFENesin-dextromethorphan, HYDROcodone-acetaminophen, menthol-cetylpyridinium, morphine injection, ondansetron (ZOFRAN) IV, phenol  Assessment & Plan    1. Mechanical fall causing left impacted femoral neck fracture- agent admitted on 11/07/2011 who underwent open reduction  internal fixation by orthopedics surgeon Dr. Otelia Sergeant, on 11/07/2011 - she is now day 2 postop-- PT OT NWB per Ortho, local wound care per nursing staff and orthopedics. Hb has dropped but a little less than prior.   She is on Lovenox and Coumadin for DVT prophylaxis per orthopedics, Lovenox to be stopped once INR is 1.8 per orthopedics. Social work requested to see the patient for appropriate placement. She is on postop vancomycin per orthopedics for infection prophylaxis-likely can be d/c per Ortho discretion     2. GERD and hiatal hernia - continue Pepcid per home dose no acute issues.     3.Hypertension and dyslipidemia - continue home medications which are nifedipine 60 and Vytorin 10-40 and monitor.     4. ?UTI-Uc showed >100,000 CFU-patient not symptomatic at present.  Await specication.  Would d/c Vanc, hold other PO coverage unless has fever, elevated WBC count   5. Consitpation-continue Colace, add sonritol-could be 2/2 to Nitrates she is already on     DVT Prophylaxis  Lovenox/Couamdin    Procedures - open reduction internal fixation by orthopedics surgeon Dr. Otelia Sergeant, on 11/07/2011   Consults  - Orthopedics Dr Otelia Sergeant, PT-OT, S Work  Dispo-Likely d/c to SNF in 1-2 days     Rhetta Mura M.D on 11/09/2011 at 3:52 PM  Between 7am to 7pm - Pager - [phone]  After 7pm go to [email or web address] - password [email or web address]  And look for the night coverage person covering for me after hours  [facility] Office  [phone]

## 2011-11-10 ENCOUNTER — Encounter (HOSPITAL_COMMUNITY): Payer: Self-pay | Admitting: Specialist

## 2011-11-10 DIAGNOSIS — Z96649 Presence of unspecified artificial hip joint: Secondary | ICD-10-CM | POA: Diagnosis not present

## 2011-11-10 DIAGNOSIS — S72009A Fracture of unspecified part of neck of unspecified femur, initial encounter for closed fracture: Secondary | ICD-10-CM

## 2011-11-10 DIAGNOSIS — R32 Unspecified urinary incontinence: Secondary | ICD-10-CM | POA: Diagnosis not present

## 2011-11-10 DIAGNOSIS — R279 Unspecified lack of coordination: Secondary | ICD-10-CM | POA: Diagnosis not present

## 2011-11-10 DIAGNOSIS — M6281 Muscle weakness (generalized): Secondary | ICD-10-CM | POA: Diagnosis not present

## 2011-11-10 DIAGNOSIS — M159 Polyosteoarthritis, unspecified: Secondary | ICD-10-CM | POA: Diagnosis not present

## 2011-11-10 DIAGNOSIS — M545 Low back pain: Secondary | ICD-10-CM | POA: Diagnosis not present

## 2011-11-10 DIAGNOSIS — R928 Other abnormal and inconclusive findings on diagnostic imaging of breast: Secondary | ICD-10-CM | POA: Diagnosis not present

## 2011-11-10 DIAGNOSIS — R29818 Other symptoms and signs involving the nervous system: Secondary | ICD-10-CM | POA: Diagnosis not present

## 2011-11-10 DIAGNOSIS — R262 Difficulty in walking, not elsewhere classified: Secondary | ICD-10-CM | POA: Diagnosis not present

## 2011-11-10 DIAGNOSIS — Z7901 Long term (current) use of anticoagulants: Secondary | ICD-10-CM | POA: Diagnosis not present

## 2011-11-10 DIAGNOSIS — Z471 Aftercare following joint replacement surgery: Secondary | ICD-10-CM | POA: Diagnosis not present

## 2011-11-10 DIAGNOSIS — F329 Major depressive disorder, single episode, unspecified: Secondary | ICD-10-CM | POA: Diagnosis not present

## 2011-11-10 DIAGNOSIS — S72033A Displaced midcervical fracture of unspecified femur, initial encounter for closed fracture: Secondary | ICD-10-CM | POA: Diagnosis not present

## 2011-11-10 DIAGNOSIS — Z4789 Encounter for other orthopedic aftercare: Secondary | ICD-10-CM | POA: Diagnosis not present

## 2011-11-10 DIAGNOSIS — N182 Chronic kidney disease, stage 2 (mild): Secondary | ICD-10-CM | POA: Diagnosis not present

## 2011-11-10 DIAGNOSIS — K5900 Constipation, unspecified: Secondary | ICD-10-CM | POA: Diagnosis not present

## 2011-11-10 DIAGNOSIS — R609 Edema, unspecified: Secondary | ICD-10-CM | POA: Diagnosis not present

## 2011-11-10 DIAGNOSIS — K5730 Diverticulosis of large intestine without perforation or abscess without bleeding: Secondary | ICD-10-CM | POA: Diagnosis not present

## 2011-11-10 DIAGNOSIS — M79609 Pain in unspecified limb: Secondary | ICD-10-CM | POA: Diagnosis not present

## 2011-11-10 DIAGNOSIS — M25559 Pain in unspecified hip: Secondary | ICD-10-CM | POA: Diagnosis not present

## 2011-11-10 DIAGNOSIS — E782 Mixed hyperlipidemia: Secondary | ICD-10-CM

## 2011-11-10 DIAGNOSIS — I10 Essential (primary) hypertension: Secondary | ICD-10-CM

## 2011-11-10 LAB — CBC
Hemoglobin: 11.8 g/dL — ABNORMAL LOW (ref 12.0–15.0)
MCH: 30.1 pg (ref 26.0–34.0)
Platelets: 181 10*3/uL (ref 150–400)
RBC: 3.92 MIL/uL (ref 3.87–5.11)
WBC: 8.6 10*3/uL (ref 4.0–10.5)

## 2011-11-10 LAB — PROTIME-INR: Prothrombin Time: 15.4 seconds — ABNORMAL HIGH (ref 11.6–15.2)

## 2011-11-10 LAB — BASIC METABOLIC PANEL
BUN: 10 mg/dL (ref 6–23)
Chloride: 102 mEq/L (ref 96–112)
GFR calc Af Amer: >90 mL/min (ref >90)
GFR calc non Af Amer: 79 mL/min — ABNORMAL LOW (ref >90)
Potassium: 3.9 mEq/L (ref 3.5–5.1)
Sodium: 135 mEq/L (ref 135–145)

## 2011-11-10 MED ORDER — CLONAZEPAM 0.5 MG PO TABS: 0.5000 mg | ORAL_TABLET | Freq: Two times a day (BID) | ORAL | Status: DC | PRN

## 2011-11-10 MED ORDER — WARFARIN SODIUM 7.5 MG PO TABS: 7.5000 mg | ORAL_TABLET | Freq: Every day | ORAL | Status: DC

## 2011-11-10 MED ORDER — ACETAMINOPHEN 325 MG PO TABS: 650.0000 mg | ORAL_TABLET | Freq: Four times a day (QID) | ORAL | Status: AC | PRN

## 2011-11-10 MED ORDER — WARFARIN SODIUM 7.5 MG PO TABS
7.5000 mg | ORAL_TABLET | Freq: Once | ORAL | Status: DC
  Filled 2011-11-10: qty 1

## 2011-11-10 MED ORDER — ENOXAPARIN SODIUM 30 MG/0.3ML ~~LOC~~ SOLN: 30.0000 mg | Freq: Two times a day (BID) | SUBCUTANEOUS | Status: DC

## 2011-11-10 MED ORDER — HYDROCODONE-ACETAMINOPHEN 5-325 MG PO TABS: 1.0000 | ORAL_TABLET | Freq: Three times a day (TID) | ORAL | Status: AC | PRN

## 2011-11-10 NOTE — Discharge Summary (Signed)
[facility] Hospital Discharge Summary  Date of Admission: 11/07/2011  1:11 PM Admitter: @ADMITPROV @   Date of Discharge6/27/2013 Attending Physician: Rhetta Mura, MD  Things to Follow-up on: Needs INr in 2-3 days-if INr above 1.8, could d/c Lovenox Needs Bmet in 3-5 days Re-check O2 sats off oxygen and educate regarding spirometry No Weight bearing until seen by Ortho, Dr. Otelia Sergeant    78 y.o. female, with H/O HTN, Dyslipidemia and GERD who is active and has no Cardiac problebs had a trip and fall at [facility] hurt her L Hip came to Er and was found to have L Fem fracture, labs and EKG-CXR stable, Ortho requested Med admit with OR fixation which was done on a impacted valgus (Garden 2) femoral neck fracture, 6/24-patient was stable from a cardiorespiratory standpoint for surgery from hospitalist opinion and proceeded to have surgery. Please see below for further Hospital course    Hospital Course by problem list:  LOS: 3 days   1. Mechanical fall causing left impacted femoral neck fracture- agent admitted on 11/07/2011 who underwent open reduction internal fixation by orthopedics surgeon Dr. Otelia Sergeant, on 11/07/2011 - she is now day 2 postop-- PT OT NWB per Ortho, local wound care per nursing staff and orthopedics. Hb has dropped but a little less than prior.  She is on Lovenox and Coumadin for DVT prophylaxis per orthopedics, Lovenox to be stopped once INR is 1.8 per orthopedics. Patient has been discharged on 3 days of Lovenox and 3 days of Coumadin 7.5 mg. Social work requested to see the patient for appropriate placement. She is on postop vancomycin per orthopedics which has subsequently been discontinued.  2. GERD and hiatal hernia - continue Pepcid per home dose no acute issues.   3.Hypertension and dyslipidemia - continue home medications which are nifedipine 60 and Vytorin 10-40 and monitor.   4. ?UTI-Uc showed >100,000 CFU of Klebsiella-patient not  symptomatic. This was thought to be a contaminant Would d/c Vanc, hold other PO coverage unless has fever, elevated WBC count-she did not help either and was not discharged on antibiotics  5. Consitpation-continue Colace, add sorbitol-could be 2/2 to Nitrates she is already on   6. Hypoxia-this is thought to be secondary to atelectasis as she desaturated and patient will need low-dose oxygen 2 L per minute a nursing facility. Patient should be encouraged to participate with activity eventually seen by orthopedics. Patient also will need to have a desaturation check in about one to 2 weeks.  DVT Prophylaxis Lovenox/Couamdin-see above   Procedures Performed and pertinent labs: Dg Wrist Complete Left  11/07/2011  *RADIOLOGY REPORT*  Clinical Data: Fall, left hip pain  LEFT WRIST - COMPLETE 3+ VIEW  Comparison: None.  Findings: Four views of the left wrist submitted.  No acute fracture or subluxation.  There is diffuse osteopenia. Degenerative changes are noted first carpal metacarpal joint.  IMPRESSION: Diffuse osteopenia.  No acute fracture or subluxation.  Generative changes first carpal metacarpal joint.  Original Report Authenticated By: Natasha Mead, M.D.   Dg Hip Complete Left  11/07/2011  *RADIOLOGY REPORT*  Clinical Data: Fall, left hip pain.  LEFT HIP - COMPLETE 2+ VIEW  Comparison: None  Findings: Degenerative joint disease changes in the hips bilaterally.  Severe rightward scoliosis in the lumbar spine with associated degenerative changes.  There is angulation in the lateral left femoral neck region.  This is concerning for slightly impacted left femoral neck fracture.  No dislocation.  Diffuse osteopenia.  IMPRESSION: Angulation  of the cortex laterally in the subcapital left femoral neck concerning for slightly impacted left femoral neck fracture.  Original Report Authenticated By: Cyndie Chime, M.D.   Dg Hip Operative Left  11/07/2011  *RADIOLOGY REPORT*  Clinical Data: 78 year old female  status post left hip ORIF.  OPERATIVE LEFT HIP  Comparison: Preoperative study 11/07/2011.  Findings: Two intraoperative fluoroscopic views of the proximal left femur.  Three cannulated screws traverse the subcapital left proximal femur fracture with near anatomic alignment.  Fluoroscopy time of 0.9 minutes was utilized.  IMPRESSION: Left femoral neck fracture ORIF with no adverse features.  Original Report Authenticated By: Harley Hallmark, M.D.   Dg Chest Port 1 View  11/07/2011  *RADIOLOGY REPORT*  Clinical Data: Cough.  PORTABLE CHEST - 1 VIEW  Comparison: None.  Findings: Heart is borderline in size.  No confluent airspace opacity or effusion.  No acute bony abnormality. Mild hyperinflation.  IMPRESSION: No acute findings.  Original Report Authenticated By: Cyndie Chime, M.D.    Discharge Vitals & PE:  BP 105/66  Pulse 69  Temp 98.2 F (36.8 C) (Oral)  Resp 18  Ht 5\' 4"  (1.626 m)  Wt 67.1 kg (147 lb 14.9 oz)  BMI 25.39 kg/m2  SpO2 93% Awake Alert, Oriented *3, No new F.N deficits, Normal affect  [place].AT,PERRAL  Supple Neck,No JVD, No cervical lymphadenopathy appriciated.  Symmetrical Chest wall movement, Good air movement bilaterally, some scattered crackles in R lower chest area  RRR,No Gallops,Rubs or new Murmurs, No Parasternal Heave  +ve B.Sounds, Abd Soft, Non tender, No organomegaly appriciated, No rebound -guarding or rigidity.  No Cyanosis, Clubbing or edema, No new Rash or bruise    Discharge Labs:  Results for orders placed during the hospital encounter of 11/07/11 (from the past 24 hour(s))  CBC     Status: Abnormal   Collection Time   11/10/11  5:07 AM      Component Value Range   WBC 8.6  4.0 - 10.5 K/uL   RBC 3.92  3.87 - 5.11 MIL/uL   Hemoglobin 11.8 (*) 12.0 - 15.0 g/dL   HCT 16.1  09.6 - 04.5 %   MCV 94.1  78.0 - 100.0 fL   MCH 30.1  26.0 - 34.0 pg   MCHC 32.0  30.0 - 36.0 g/dL   RDW 40.9  81.1 - 91.4 %   Platelets 181  150 - 400 K/uL  PROTIME-INR      Status: Abnormal   Collection Time   11/10/11  5:07 AM      Component Value Range   Prothrombin Time 15.4 (*) 11.6 - 15.2 seconds   INR 1.19  0.00 - 1.49  BASIC METABOLIC PANEL     Status: Abnormal   Collection Time   11/10/11  5:07 AM      Component Value Range   Sodium 135  135 - 145 mEq/L   Potassium 3.9  3.5 - 5.1 mEq/L   Chloride 102  96 - 112 mEq/L   CO2 25  19 - 32 mEq/L   Glucose, Bld 114 (*) 70 - 99 mg/dL   BUN 10  6 - 23 mg/dL   Creatinine, Ser 7.82  0.50 - 1.10 mg/dL   Calcium 8.9  8.4 - 95.6 mg/dL   GFR calc non Af Amer 79 (*) >90 mL/min   GFR calc Af Amer >90  >90 mL/min    Disposition and follow-up:   Ms.Tina Hampton was discharged from in good condition.  Follow-up Appointments:  Follow-up Information    Follow up with NITKA,JAMES E, MD. Schedule an appointment as soon as possible for a visit in 2 weeks.   Contact information:   [employer] [address] [place] [address] [phone]          Discharge Medications: Medication List  As of 11/10/2011  9:24 AM   STOP taking these medications         ezetimibe-simvastatin 10-40 MG per tablet         TAKE these medications         acetaminophen 325 MG tablet   Commonly known as: TYLENOL   Take 2 tablets (650 mg total) by mouth every 6 (six) hours as needed for pain (or Fever >/= 101).      buPROPion 150 MG 24 hr tablet   Commonly known as: WELLBUTRIN XL   Take 450 mg by mouth daily.      calcium-vitamin D 500-200 MG-UNIT per tablet   Commonly known as: OSCAL WITH D   Take 1 tablet by mouth daily.      cholecalciferol 1000 UNITS tablet   Commonly known as: VITAMIN D   Take 1,000 Units by mouth daily.      clonazePAM 0.5 MG tablet   Commonly known as: KLONOPIN   Take 1 tablet (0.5 mg total) by mouth 2 (two) times daily as needed (anxiety).      conjugated estrogens vaginal cream   Commonly known as: PREMARIN   Place 1 g vaginally See admin  instructions. Pt uses twice weekly. No certain days      docusate sodium 100 MG capsule   Commonly known as: COLACE   Take 200 mg by mouth at bedtime.      enoxaparin 30 MG/0.3ML injection   Commonly known as: LOVENOX   Inject 0.3 mLs (30 mg total) into the skin every 12 (twelve) hours.      famotidine 20 MG tablet   Commonly known as: PEPCID   Take 20 mg by mouth daily.      Fish Oil 1200 MG Caps   Take 1,200 mg by mouth 2 (two) times daily.      FLUoxetine 40 MG capsule   Commonly known as: PROZAC   Take 40 mg by mouth daily.      Glucosamine HCl 1000 MG Tabs   Take 2,000 mg by mouth 2 (two) times daily.      HYDROcodone-acetaminophen 5-325 MG per tablet   Commonly known as: NORCO   Take 1 tablet by mouth every 8 (eight) hours as needed for pain (2nd choice for pain).      multivitamin with minerals Tabs   Take 1 tablet by mouth daily.      NIFEdipine 60 MG 24 hr tablet   Commonly known as: PROCARDIA-XL/ADALAT CC   Take 60 mg by mouth daily.      SYSTANE 0.4-0.3 % Soln   Generic drug: Polyethyl Glycol-Propyl Glycol   Apply 1 drop to eye 2 (two) times daily.      warfarin 7.5 MG tablet   Commonly known as: COUMADIN   Take 1 tablet (7.5 mg total) by mouth daily.           Medications Discontinued During This Encounter  Medication Reason  . metoprolol tartrate (LOPRESSOR) tablet 25 mg   . magnesium sulfate IVPB 2 g 50 mL   . HYDROmorphone (DILAUDID) injection 0.25-0.5 mg Patient Transfer  . promethazine (PHENERGAN)  injection 6.25-12.5 mg Patient Transfer  . metoprolol (LOPRESSOR) injection 5 mg   . Fish Oil CAPS 1,200 mg   . Polyethyl Glycol-Propyl Glycol 0.4-0.3 % SOLN 1 drop   . Glucosamine HCl TABS 2,000 mg   . 0.9 %  sodium chloride infusion   . HYDROcodone-acetaminophen (NORCO) 5-325 MG per tablet 1-2 tablet   . ondansetron (ZOFRAN) tablet 4 mg   . ondansetron (ZOFRAN) injection 4 mg   . dextrose 5 %-0.45 % sodium chloride infusion   . morphine 2  MG/ML injection 2 mg   . morphine 4 MG/ML injection 4 mg   . ondansetron (ZOFRAN) injection 4 mg   . dextrose 5 % and 0.45 % NaCl with KCl 20 mEq/L infusion   . ondansetron (ZOFRAN) tablet 4 mg   . metoCLOPramide (REGLAN) tablet 5-10 mg   . metoCLOPramide (REGLAN) injection 5-10 mg   . HYDROcodone-acetaminophen (NORCO) 5-325 MG per tablet 1-2 tablet   . albuterol (PROVENTIL) (5 MG/ML) 0.5% nebulizer solution 2.5 mg   . clonazePAM (KLONOPIN) 0.5 MG tablet Stop Taking at Discharge  . ezetimibe-simvastatin (VYTORIN) 10-40 MG per tablet Stop Taking at Discharge      > 40 minutes time spent preparing d/c summary, including direct face-face patient Time, contact with consultants, family and care coordination   Signed: Rhetta Mura 11/10/2011, 9:24 AM

## 2011-11-10 NOTE — Care Management Note (Signed)
    Page 1 of 1   11/10/2011     11:02:20 AM   CARE MANAGEMENT NOTE 11/10/2011  Patient:  Tina Hampton, Tina Hampton   Account Number:  [id number]  Date Initiated:  11/10/2011  Documentation initiated by:  Lanier Clam  Subjective/Objective Assessment:   ADMITTED W/HIP FRACTURE.     Action/Plan:   FROM-INDEP LIV.   Anticipated DC Date:  11/10/2011   Anticipated DC Plan:  SKILLED NURSING FACILITY  In-house referral  Clinical Social Worker      DC Planning Services  CM consult      Choice offered to / List presented to:             Status of service:  Completed, signed off Medicare Important Message given?   (If response is "NO", the following Medicare IM given date fields will be blank) Date Medicare IM given:   Date Additional Medicare IM given:    Discharge Disposition:  SKILLED NURSING FACILITY  Per UR Regulation:  Reviewed for med. necessity/level of care/duration of stay  If discussed at Long Length of Stay Meetings, dates discussed:    Comments:  11/10/11 Neyland Pettengill RN,BSN NCM [phone] D/C SNF TODAY.

## 2011-11-10 NOTE — Progress Notes (Signed)
ANTICOAGULATION CONSULT NOTE - Follow Up Consult  Pharmacy Consult for Warfarin Indication: VTE prophylaxis  Allergies  Allergen Reactions  . Penicillins Other (See Comments)    CHILDHOOD REACTION    Patient Measurements: Height: 5\' 4"  (162.6 cm) Weight: 147 lb 14.9 oz (67.1 kg) IBW/kg (Calculated) : 54.7   Vital Signs: Temp: 98.2 F (36.8 C) (06/27 0535) Temp src: Oral (06/27 0535) BP: 105/66 mmHg (06/27 0535) Pulse Rate: 69  (06/27 0535)  Labs:  Basename 11/10/11 0507 11/09/11 0443 11/08/11 1151 11/08/11 1150 11/08/11 0443 11/07/11 1510  HGB 11.8* 11.6* -- -- -- --  HCT 36.9 35.2* -- 37.2 -- --  PLT 181 169 -- -- -- 237  APTT -- -- -- -- -- --  LABPROT 15.4* 14.2 -- -- 13.9 --  INR 1.19 1.08 -- -- 1.05 --  HEPARINUNFRC -- -- -- -- -- --  CREATININE 0.67 0.78 0.96 -- -- --  CKTOTAL -- -- -- -- -- --  CKMB -- -- -- -- -- --  TROPONINI -- -- -- -- -- --    Estimated Creatinine Clearance: 49.3 ml/min (by C-G formula based on Cr of 0.67).    Assessment: 78 yo F s/p cannulated hip pinning for L hip fracture. Started warfarin 6/24 for DVT prophylaxis.   INR slowly responding to 7.5 mg doses. No bleeding/complications reported.  CBC stable.  Goal of Therapy:  INR 2-3   Plan:  Warfarin 7.5 mg once today. Follow up INR in AM. Warfarin education completed 6/26.   Clance Boll 11/10/2011,9:10 AM

## 2011-11-11 DIAGNOSIS — R32 Unspecified urinary incontinence: Secondary | ICD-10-CM | POA: Diagnosis not present

## 2011-11-11 DIAGNOSIS — Z4789 Encounter for other orthopedic aftercare: Secondary | ICD-10-CM | POA: Diagnosis not present

## 2011-11-11 DIAGNOSIS — I10 Essential (primary) hypertension: Secondary | ICD-10-CM | POA: Diagnosis not present

## 2011-11-11 DIAGNOSIS — M25559 Pain in unspecified hip: Secondary | ICD-10-CM | POA: Diagnosis not present

## 2011-11-11 DIAGNOSIS — M545 Low back pain: Secondary | ICD-10-CM | POA: Diagnosis not present

## 2011-11-12 DIAGNOSIS — M25559 Pain in unspecified hip: Secondary | ICD-10-CM

## 2011-11-12 HISTORY — DX: Pain in unspecified hip: M25.559

## 2011-11-21 DIAGNOSIS — Z7901 Long term (current) use of anticoagulants: Secondary | ICD-10-CM | POA: Diagnosis not present

## 2011-12-09 DIAGNOSIS — K5900 Constipation, unspecified: Secondary | ICD-10-CM | POA: Diagnosis not present

## 2011-12-09 DIAGNOSIS — M25559 Pain in unspecified hip: Secondary | ICD-10-CM | POA: Diagnosis not present

## 2011-12-09 DIAGNOSIS — I10 Essential (primary) hypertension: Secondary | ICD-10-CM | POA: Diagnosis not present

## 2011-12-09 DIAGNOSIS — R609 Edema, unspecified: Secondary | ICD-10-CM | POA: Diagnosis not present

## 2011-12-09 DIAGNOSIS — F329 Major depressive disorder, single episode, unspecified: Secondary | ICD-10-CM | POA: Diagnosis not present

## 2011-12-15 DIAGNOSIS — R262 Difficulty in walking, not elsewhere classified: Secondary | ICD-10-CM | POA: Diagnosis not present

## 2011-12-15 DIAGNOSIS — R928 Other abnormal and inconclusive findings on diagnostic imaging of breast: Secondary | ICD-10-CM | POA: Diagnosis not present

## 2011-12-15 DIAGNOSIS — I10 Essential (primary) hypertension: Secondary | ICD-10-CM | POA: Diagnosis not present

## 2011-12-15 DIAGNOSIS — Z96649 Presence of unspecified artificial hip joint: Secondary | ICD-10-CM | POA: Diagnosis not present

## 2011-12-15 DIAGNOSIS — R29818 Other symptoms and signs involving the nervous system: Secondary | ICD-10-CM | POA: Diagnosis not present

## 2011-12-15 DIAGNOSIS — M6281 Muscle weakness (generalized): Secondary | ICD-10-CM | POA: Diagnosis not present

## 2011-12-15 DIAGNOSIS — R279 Unspecified lack of coordination: Secondary | ICD-10-CM | POA: Diagnosis not present

## 2011-12-15 DIAGNOSIS — Z471 Aftercare following joint replacement surgery: Secondary | ICD-10-CM | POA: Diagnosis not present

## 2011-12-15 DIAGNOSIS — Z4789 Encounter for other orthopedic aftercare: Secondary | ICD-10-CM | POA: Diagnosis not present

## 2011-12-15 DIAGNOSIS — K5730 Diverticulosis of large intestine without perforation or abscess without bleeding: Secondary | ICD-10-CM | POA: Diagnosis not present

## 2011-12-15 DIAGNOSIS — M545 Low back pain: Secondary | ICD-10-CM | POA: Diagnosis not present

## 2011-12-15 DIAGNOSIS — R609 Edema, unspecified: Secondary | ICD-10-CM | POA: Diagnosis not present

## 2011-12-15 DIAGNOSIS — N182 Chronic kidney disease, stage 2 (mild): Secondary | ICD-10-CM | POA: Diagnosis not present

## 2011-12-15 DIAGNOSIS — M25559 Pain in unspecified hip: Secondary | ICD-10-CM | POA: Diagnosis not present

## 2011-12-15 DIAGNOSIS — M79609 Pain in unspecified limb: Secondary | ICD-10-CM | POA: Diagnosis not present

## 2012-01-06 DIAGNOSIS — R609 Edema, unspecified: Secondary | ICD-10-CM | POA: Diagnosis not present

## 2012-01-06 DIAGNOSIS — Z4789 Encounter for other orthopedic aftercare: Secondary | ICD-10-CM | POA: Diagnosis not present

## 2012-01-06 DIAGNOSIS — I10 Essential (primary) hypertension: Secondary | ICD-10-CM | POA: Diagnosis not present

## 2012-01-06 DIAGNOSIS — R928 Other abnormal and inconclusive findings on diagnostic imaging of breast: Secondary | ICD-10-CM | POA: Diagnosis not present

## 2012-01-06 DIAGNOSIS — M25559 Pain in unspecified hip: Secondary | ICD-10-CM | POA: Diagnosis not present

## 2012-01-18 DIAGNOSIS — M25569 Pain in unspecified knee: Secondary | ICD-10-CM | POA: Diagnosis not present

## 2012-01-18 DIAGNOSIS — S72033A Displaced midcervical fracture of unspecified femur, initial encounter for closed fracture: Secondary | ICD-10-CM | POA: Diagnosis not present

## 2012-01-25 DIAGNOSIS — M545 Low back pain: Secondary | ICD-10-CM | POA: Diagnosis not present

## 2012-01-25 DIAGNOSIS — M6281 Muscle weakness (generalized): Secondary | ICD-10-CM | POA: Diagnosis not present

## 2012-01-25 DIAGNOSIS — M4120 Other idiopathic scoliosis, site unspecified: Secondary | ICD-10-CM | POA: Diagnosis not present

## 2012-01-25 DIAGNOSIS — IMO0002 Reserved for concepts with insufficient information to code with codable children: Secondary | ICD-10-CM | POA: Diagnosis not present

## 2012-01-27 DIAGNOSIS — M4120 Other idiopathic scoliosis, site unspecified: Secondary | ICD-10-CM | POA: Diagnosis not present

## 2012-01-27 DIAGNOSIS — IMO0002 Reserved for concepts with insufficient information to code with codable children: Secondary | ICD-10-CM | POA: Diagnosis not present

## 2012-01-27 DIAGNOSIS — M545 Low back pain: Secondary | ICD-10-CM | POA: Diagnosis not present

## 2012-01-27 DIAGNOSIS — M6281 Muscle weakness (generalized): Secondary | ICD-10-CM | POA: Diagnosis not present

## 2012-01-27 DIAGNOSIS — M25569 Pain in unspecified knee: Secondary | ICD-10-CM | POA: Diagnosis not present

## 2012-01-27 DIAGNOSIS — M25469 Effusion, unspecified knee: Secondary | ICD-10-CM | POA: Diagnosis not present

## 2012-01-30 DIAGNOSIS — M4120 Other idiopathic scoliosis, site unspecified: Secondary | ICD-10-CM | POA: Diagnosis not present

## 2012-01-30 DIAGNOSIS — M6281 Muscle weakness (generalized): Secondary | ICD-10-CM | POA: Diagnosis not present

## 2012-01-30 DIAGNOSIS — M545 Low back pain: Secondary | ICD-10-CM | POA: Diagnosis not present

## 2012-01-30 DIAGNOSIS — IMO0002 Reserved for concepts with insufficient information to code with codable children: Secondary | ICD-10-CM | POA: Diagnosis not present

## 2012-01-31 DIAGNOSIS — M6281 Muscle weakness (generalized): Secondary | ICD-10-CM | POA: Diagnosis not present

## 2012-01-31 DIAGNOSIS — M545 Low back pain: Secondary | ICD-10-CM | POA: Diagnosis not present

## 2012-01-31 DIAGNOSIS — M4120 Other idiopathic scoliosis, site unspecified: Secondary | ICD-10-CM | POA: Diagnosis not present

## 2012-01-31 DIAGNOSIS — IMO0002 Reserved for concepts with insufficient information to code with codable children: Secondary | ICD-10-CM | POA: Diagnosis not present

## 2012-02-01 DIAGNOSIS — M4120 Other idiopathic scoliosis, site unspecified: Secondary | ICD-10-CM | POA: Diagnosis not present

## 2012-02-01 DIAGNOSIS — M6281 Muscle weakness (generalized): Secondary | ICD-10-CM | POA: Diagnosis not present

## 2012-02-01 DIAGNOSIS — IMO0002 Reserved for concepts with insufficient information to code with codable children: Secondary | ICD-10-CM | POA: Diagnosis not present

## 2012-02-01 DIAGNOSIS — M545 Low back pain: Secondary | ICD-10-CM | POA: Diagnosis not present

## 2012-02-02 DIAGNOSIS — M545 Low back pain: Secondary | ICD-10-CM | POA: Diagnosis not present

## 2012-02-02 DIAGNOSIS — M6281 Muscle weakness (generalized): Secondary | ICD-10-CM | POA: Diagnosis not present

## 2012-02-02 DIAGNOSIS — IMO0002 Reserved for concepts with insufficient information to code with codable children: Secondary | ICD-10-CM | POA: Diagnosis not present

## 2012-02-02 DIAGNOSIS — M4120 Other idiopathic scoliosis, site unspecified: Secondary | ICD-10-CM | POA: Diagnosis not present

## 2012-02-03 DIAGNOSIS — M1710 Unilateral primary osteoarthritis, unspecified knee: Secondary | ICD-10-CM | POA: Diagnosis not present

## 2012-02-03 DIAGNOSIS — M23329 Other meniscus derangements, posterior horn of medial meniscus, unspecified knee: Secondary | ICD-10-CM | POA: Diagnosis not present

## 2012-02-03 DIAGNOSIS — M545 Low back pain: Secondary | ICD-10-CM | POA: Diagnosis not present

## 2012-02-03 DIAGNOSIS — M4120 Other idiopathic scoliosis, site unspecified: Secondary | ICD-10-CM | POA: Diagnosis not present

## 2012-02-03 DIAGNOSIS — IMO0002 Reserved for concepts with insufficient information to code with codable children: Secondary | ICD-10-CM | POA: Diagnosis not present

## 2012-02-03 DIAGNOSIS — M6281 Muscle weakness (generalized): Secondary | ICD-10-CM | POA: Diagnosis not present

## 2012-02-06 DIAGNOSIS — F333 Major depressive disorder, recurrent, severe with psychotic symptoms: Secondary | ICD-10-CM | POA: Diagnosis not present

## 2012-02-06 DIAGNOSIS — M545 Low back pain: Secondary | ICD-10-CM | POA: Diagnosis not present

## 2012-02-06 DIAGNOSIS — M4120 Other idiopathic scoliosis, site unspecified: Secondary | ICD-10-CM | POA: Diagnosis not present

## 2012-02-06 DIAGNOSIS — IMO0002 Reserved for concepts with insufficient information to code with codable children: Secondary | ICD-10-CM | POA: Diagnosis not present

## 2012-02-06 DIAGNOSIS — M6281 Muscle weakness (generalized): Secondary | ICD-10-CM | POA: Diagnosis not present

## 2012-02-07 DIAGNOSIS — M6281 Muscle weakness (generalized): Secondary | ICD-10-CM | POA: Diagnosis not present

## 2012-02-07 DIAGNOSIS — M545 Low back pain: Secondary | ICD-10-CM | POA: Diagnosis not present

## 2012-02-07 DIAGNOSIS — IMO0002 Reserved for concepts with insufficient information to code with codable children: Secondary | ICD-10-CM | POA: Diagnosis not present

## 2012-02-07 DIAGNOSIS — M4120 Other idiopathic scoliosis, site unspecified: Secondary | ICD-10-CM | POA: Diagnosis not present

## 2012-02-08 DIAGNOSIS — IMO0002 Reserved for concepts with insufficient information to code with codable children: Secondary | ICD-10-CM | POA: Diagnosis not present

## 2012-02-08 DIAGNOSIS — M6281 Muscle weakness (generalized): Secondary | ICD-10-CM | POA: Diagnosis not present

## 2012-02-08 DIAGNOSIS — M545 Low back pain: Secondary | ICD-10-CM | POA: Diagnosis not present

## 2012-02-08 DIAGNOSIS — M4120 Other idiopathic scoliosis, site unspecified: Secondary | ICD-10-CM | POA: Diagnosis not present

## 2012-02-09 DIAGNOSIS — M545 Low back pain: Secondary | ICD-10-CM | POA: Diagnosis not present

## 2012-02-09 DIAGNOSIS — M4120 Other idiopathic scoliosis, site unspecified: Secondary | ICD-10-CM | POA: Diagnosis not present

## 2012-02-09 DIAGNOSIS — IMO0002 Reserved for concepts with insufficient information to code with codable children: Secondary | ICD-10-CM | POA: Diagnosis not present

## 2012-02-09 DIAGNOSIS — M6281 Muscle weakness (generalized): Secondary | ICD-10-CM | POA: Diagnosis not present

## 2012-02-10 DIAGNOSIS — M4120 Other idiopathic scoliosis, site unspecified: Secondary | ICD-10-CM | POA: Diagnosis not present

## 2012-02-10 DIAGNOSIS — IMO0002 Reserved for concepts with insufficient information to code with codable children: Secondary | ICD-10-CM | POA: Diagnosis not present

## 2012-02-10 DIAGNOSIS — M6281 Muscle weakness (generalized): Secondary | ICD-10-CM | POA: Diagnosis not present

## 2012-02-10 DIAGNOSIS — M1710 Unilateral primary osteoarthritis, unspecified knee: Secondary | ICD-10-CM | POA: Diagnosis not present

## 2012-02-10 DIAGNOSIS — M545 Low back pain: Secondary | ICD-10-CM | POA: Diagnosis not present

## 2012-02-13 DIAGNOSIS — M545 Low back pain: Secondary | ICD-10-CM | POA: Diagnosis not present

## 2012-02-13 DIAGNOSIS — M6281 Muscle weakness (generalized): Secondary | ICD-10-CM | POA: Diagnosis not present

## 2012-02-13 DIAGNOSIS — M1710 Unilateral primary osteoarthritis, unspecified knee: Secondary | ICD-10-CM | POA: Diagnosis not present

## 2012-02-13 DIAGNOSIS — M4120 Other idiopathic scoliosis, site unspecified: Secondary | ICD-10-CM | POA: Diagnosis not present

## 2012-02-13 DIAGNOSIS — IMO0002 Reserved for concepts with insufficient information to code with codable children: Secondary | ICD-10-CM | POA: Diagnosis not present

## 2012-02-13 DIAGNOSIS — M23329 Other meniscus derangements, posterior horn of medial meniscus, unspecified knee: Secondary | ICD-10-CM | POA: Diagnosis not present

## 2012-02-14 DIAGNOSIS — Z96649 Presence of unspecified artificial hip joint: Secondary | ICD-10-CM | POA: Diagnosis not present

## 2012-02-15 DIAGNOSIS — Z96649 Presence of unspecified artificial hip joint: Secondary | ICD-10-CM | POA: Diagnosis not present

## 2012-02-16 DIAGNOSIS — Z96649 Presence of unspecified artificial hip joint: Secondary | ICD-10-CM | POA: Diagnosis not present

## 2012-02-20 DIAGNOSIS — Z96649 Presence of unspecified artificial hip joint: Secondary | ICD-10-CM | POA: Diagnosis not present

## 2012-02-21 DIAGNOSIS — Z96649 Presence of unspecified artificial hip joint: Secondary | ICD-10-CM | POA: Diagnosis not present

## 2012-02-21 DIAGNOSIS — F333 Major depressive disorder, recurrent, severe with psychotic symptoms: Secondary | ICD-10-CM | POA: Diagnosis not present

## 2012-02-22 DIAGNOSIS — Z96649 Presence of unspecified artificial hip joint: Secondary | ICD-10-CM | POA: Diagnosis not present

## 2012-02-23 DIAGNOSIS — Z96649 Presence of unspecified artificial hip joint: Secondary | ICD-10-CM | POA: Diagnosis not present

## 2012-02-28 DIAGNOSIS — Z96649 Presence of unspecified artificial hip joint: Secondary | ICD-10-CM | POA: Diagnosis not present

## 2012-02-29 DIAGNOSIS — S72033A Displaced midcervical fracture of unspecified femur, initial encounter for closed fracture: Secondary | ICD-10-CM | POA: Diagnosis not present

## 2012-02-29 DIAGNOSIS — M23329 Other meniscus derangements, posterior horn of medial meniscus, unspecified knee: Secondary | ICD-10-CM | POA: Diagnosis not present

## 2012-02-29 DIAGNOSIS — M1710 Unilateral primary osteoarthritis, unspecified knee: Secondary | ICD-10-CM | POA: Diagnosis not present

## 2012-02-29 DIAGNOSIS — Z96649 Presence of unspecified artificial hip joint: Secondary | ICD-10-CM | POA: Diagnosis not present

## 2012-03-01 DIAGNOSIS — Z96649 Presence of unspecified artificial hip joint: Secondary | ICD-10-CM | POA: Diagnosis not present

## 2012-03-02 DIAGNOSIS — Z96649 Presence of unspecified artificial hip joint: Secondary | ICD-10-CM | POA: Diagnosis not present

## 2012-03-06 DIAGNOSIS — Z96649 Presence of unspecified artificial hip joint: Secondary | ICD-10-CM | POA: Diagnosis not present

## 2012-03-07 DIAGNOSIS — Z96649 Presence of unspecified artificial hip joint: Secondary | ICD-10-CM | POA: Diagnosis not present

## 2012-03-08 DIAGNOSIS — Z96649 Presence of unspecified artificial hip joint: Secondary | ICD-10-CM | POA: Diagnosis not present

## 2012-03-09 DIAGNOSIS — Z96649 Presence of unspecified artificial hip joint: Secondary | ICD-10-CM | POA: Diagnosis not present

## 2012-03-10 DIAGNOSIS — Z96649 Presence of unspecified artificial hip joint: Secondary | ICD-10-CM | POA: Diagnosis not present

## 2012-03-13 DIAGNOSIS — F333 Major depressive disorder, recurrent, severe with psychotic symptoms: Secondary | ICD-10-CM | POA: Diagnosis not present

## 2012-03-13 DIAGNOSIS — Z96649 Presence of unspecified artificial hip joint: Secondary | ICD-10-CM | POA: Diagnosis not present

## 2012-03-14 DIAGNOSIS — Z96649 Presence of unspecified artificial hip joint: Secondary | ICD-10-CM | POA: Diagnosis not present

## 2012-03-15 DIAGNOSIS — Z96649 Presence of unspecified artificial hip joint: Secondary | ICD-10-CM | POA: Diagnosis not present

## 2012-03-19 DIAGNOSIS — F333 Major depressive disorder, recurrent, severe with psychotic symptoms: Secondary | ICD-10-CM | POA: Diagnosis not present

## 2012-03-27 DIAGNOSIS — F333 Major depressive disorder, recurrent, severe with psychotic symptoms: Secondary | ICD-10-CM | POA: Diagnosis not present

## 2012-04-02 DIAGNOSIS — F333 Major depressive disorder, recurrent, severe with psychotic symptoms: Secondary | ICD-10-CM | POA: Diagnosis not present

## 2012-04-04 DIAGNOSIS — R279 Unspecified lack of coordination: Secondary | ICD-10-CM | POA: Diagnosis not present

## 2012-04-04 DIAGNOSIS — R29818 Other symptoms and signs involving the nervous system: Secondary | ICD-10-CM | POA: Diagnosis not present

## 2012-04-04 DIAGNOSIS — R269 Unspecified abnormalities of gait and mobility: Secondary | ICD-10-CM | POA: Diagnosis not present

## 2012-04-04 DIAGNOSIS — M6281 Muscle weakness (generalized): Secondary | ICD-10-CM | POA: Diagnosis not present

## 2012-04-10 DIAGNOSIS — F333 Major depressive disorder, recurrent, severe with psychotic symptoms: Secondary | ICD-10-CM | POA: Diagnosis not present

## 2012-04-16 DIAGNOSIS — I10 Essential (primary) hypertension: Secondary | ICD-10-CM | POA: Diagnosis not present

## 2012-04-16 DIAGNOSIS — E785 Hyperlipidemia, unspecified: Secondary | ICD-10-CM | POA: Diagnosis not present

## 2012-04-17 DIAGNOSIS — F333 Major depressive disorder, recurrent, severe with psychotic symptoms: Secondary | ICD-10-CM | POA: Diagnosis not present

## 2012-04-24 DIAGNOSIS — M810 Age-related osteoporosis without current pathological fracture: Secondary | ICD-10-CM | POA: Diagnosis not present

## 2012-04-24 DIAGNOSIS — F329 Major depressive disorder, single episode, unspecified: Secondary | ICD-10-CM | POA: Diagnosis not present

## 2012-04-24 DIAGNOSIS — I10 Essential (primary) hypertension: Secondary | ICD-10-CM | POA: Diagnosis not present

## 2012-04-24 DIAGNOSIS — E785 Hyperlipidemia, unspecified: Secondary | ICD-10-CM | POA: Diagnosis not present

## 2012-04-25 DIAGNOSIS — F333 Major depressive disorder, recurrent, severe with psychotic symptoms: Secondary | ICD-10-CM | POA: Diagnosis not present

## 2012-04-30 DIAGNOSIS — F333 Major depressive disorder, recurrent, severe with psychotic symptoms: Secondary | ICD-10-CM | POA: Diagnosis not present

## 2012-06-11 DIAGNOSIS — F333 Major depressive disorder, recurrent, severe with psychotic symptoms: Secondary | ICD-10-CM | POA: Diagnosis not present

## 2012-07-09 DIAGNOSIS — F333 Major depressive disorder, recurrent, severe with psychotic symptoms: Secondary | ICD-10-CM | POA: Diagnosis not present

## 2012-07-12 DIAGNOSIS — F333 Major depressive disorder, recurrent, severe with psychotic symptoms: Secondary | ICD-10-CM | POA: Diagnosis not present

## 2012-08-06 DIAGNOSIS — F333 Major depressive disorder, recurrent, severe with psychotic symptoms: Secondary | ICD-10-CM | POA: Diagnosis not present

## 2012-08-10 ENCOUNTER — Other Ambulatory Visit: Payer: Self-pay | Admitting: Geriatric Medicine

## 2012-08-10 MED ORDER — NYSTATIN 100000 UNIT/GM EX POWD: CUTANEOUS | Status: DC

## 2012-09-10 DIAGNOSIS — F333 Major depressive disorder, recurrent, severe with psychotic symptoms: Secondary | ICD-10-CM | POA: Diagnosis not present

## 2012-10-10 DIAGNOSIS — F333 Major depressive disorder, recurrent, severe with psychotic symptoms: Secondary | ICD-10-CM | POA: Diagnosis not present

## 2012-10-15 DIAGNOSIS — R7989 Other specified abnormal findings of blood chemistry: Secondary | ICD-10-CM | POA: Diagnosis not present

## 2012-10-15 DIAGNOSIS — I10 Essential (primary) hypertension: Secondary | ICD-10-CM | POA: Diagnosis not present

## 2012-10-23 ENCOUNTER — Encounter: Payer: Self-pay | Admitting: Internal Medicine

## 2012-10-23 ENCOUNTER — Non-Acute Institutional Stay: Payer: Medicare Other | Admitting: Internal Medicine

## 2012-10-23 VITALS — BP 124/62 | HR 84 | Ht 60.0 in | Wt 150.0 lb

## 2012-10-23 DIAGNOSIS — E7800 Pure hypercholesterolemia, unspecified: Secondary | ICD-10-CM

## 2012-10-23 DIAGNOSIS — K219 Gastro-esophageal reflux disease without esophagitis: Secondary | ICD-10-CM | POA: Diagnosis not present

## 2012-10-23 DIAGNOSIS — F329 Major depressive disorder, single episode, unspecified: Secondary | ICD-10-CM

## 2012-10-23 DIAGNOSIS — I10 Essential (primary) hypertension: Secondary | ICD-10-CM

## 2012-10-23 NOTE — Progress Notes (Signed)
Subjective:    Patient ID: Tina Hampton, female    DOB: 12-01-1925, 79 y.o.   MRN: [id number]  HPI  Husband died in the last month. Depression is stable. She feels supported by the [facility] community.  Back pains persist.  Has mammogram and bone density scheduled for 11/01/12.  Nocturnal urinary incontinence.  Rash on lower abd. Some itching. Using Nystatin powder.  Pain in the left neck posteriorly. Helped by warm water.  Collapsed arch in the left foot. Flat foot.  Current Outpatient Prescriptions on File Prior to Visit  Medication Sig Dispense Refill  . buPROPion (WELLBUTRIN XL) 150 MG 24 hr tablet Take 450 mg by mouth daily.      . calcium-vitamin D (OSCAL WITH D) 500-200 MG-UNIT per tablet Take 1 tablet by mouth daily.      . cholecalciferol (VITAMIN D) 1000 UNITS tablet Take 1,000 Units by mouth daily.      Marland Kitchen conjugated estrogens (PREMARIN) vaginal cream Place 1 g vaginally See admin instructions. Pt uses twice weekly. No certain days      . docusate sodium (COLACE) 100 MG capsule Take 200 mg by mouth at bedtime.      . famotidine (PEPCID) 20 MG tablet Take 20 mg by mouth daily.      . Glucosamine HCl 1000 MG TABS Take 2,000 mg by mouth 2 (two) times daily.      . Multiple Vitamin (MULTIVITAMIN WITH MINERALS) TABS Take 1 tablet by mouth daily.      Marland Kitchen nystatin (MYCOSTATIN/NYSTOP) 100000 UNIT/GM POWD Apply daily to rash until resolved.  60 g  3  . Polyethyl Glycol-Propyl Glycol (SYSTANE) 0.4-0.3 % SOLN Apply 1 drop to eye 2 (two) times daily.      . [DISCONTINUED] clonazePAM (KLONOPIN) 0.5 MG tablet Take 1 tablet (0.5 mg total) by mouth 2 (two) times daily as needed (anxiety).  60 tablet  0   No current facility-administered medications on file prior to visit.     Review of Systems  Constitutional: Negative for unexpected weight change.  HENT: Positive for hearing loss. Negative for ear pain.   Eyes: Negative.   Respiratory: Negative.   Cardiovascular: Negative.    Gastrointestinal:       Heartburn. Constipation. History of hiatal hernia.  Genitourinary: Positive for hematuria.       Nocturia x5-6. Increased frequency in the daytime. Urge incontinence.  Musculoskeletal:       Chronic mild back discomfort. Some joint pains. No joint swelling, muscular pain, or muscular weakness.  Skin: Positive for rash.  Allergic/Immunologic: Negative.   Neurological: Negative.   Hematological: Negative.   Psychiatric/Behavioral: Positive for sleep disturbance.       Insomnia. Grieving over her husband's recent death.       Objective:BP 124/62  Pulse 84  Ht 5' (1.524 m)  Wt 150 lb (68.04 kg)  BMI 29.3 kg/m2    Physical Exam  Constitutional: She is oriented to person, place, and time. She appears well-developed and well-nourished. No distress.  HENT:  Head: Normocephalic.  Right Ear: External ear normal.  Left Ear: External ear normal.  Nose: Nose normal.  Mouth/Throat: Oropharynx is clear and moist.   Mild hearing loss bilaterally.  Eyes:  Wears corrective lenses. Left cataract present. Right pupil seems to be nonreactive to light.  Neck: Neck supple. No JVD present. No tracheal deviation present. No thyromegaly present.  Cardiovascular: Normal rate, regular rhythm, normal heart sounds and intact distal pulses.  Exam reveals no gallop and  no friction rub.   No murmur heard. Pulmonary/Chest: Breath sounds normal. No respiratory distress. She has no wheezes. She has no rales. She exhibits no tenderness.  Abdominal: She exhibits no distension and no mass. There is no tenderness.  Musculoskeletal: She exhibits edema.  Flatfeet with collapsed arches.  Neurological: She is alert and oriented to person, place, and time. She has normal reflexes. No cranial nerve deficit. Coordination normal.  Skin: Rash noted. No erythema. No pallor.  Multiple seborrheic keratoses. Pink rash across the lower abdomen of uncertain etiology.  Psychiatric: She has a normal  mood and affect. Her behavior is normal. Judgment and thought content normal.      LAB REVIEW 10/15/12 BMP normal  A1 C. 0.5    Assessment & Plan:  Hypertension: Controlled  GERD (gastroesophageal reflux disease): Controlled  High cholesterol: Controlled  Depression: grieving appropriately. The same Dr. Donell Beers.  Back pain: There is a Dr. Otelia Sergeant.  Urinary incontinence: Recommended urology referral  Hyperglycemia: Glucose 110 mg percent 04/16/12

## 2012-10-24 DIAGNOSIS — H40059 Ocular hypertension, unspecified eye: Secondary | ICD-10-CM | POA: Diagnosis not present

## 2012-10-29 DIAGNOSIS — M899 Disorder of bone, unspecified: Secondary | ICD-10-CM | POA: Diagnosis not present

## 2012-10-29 DIAGNOSIS — N6489 Other specified disorders of breast: Secondary | ICD-10-CM | POA: Diagnosis not present

## 2012-10-29 DIAGNOSIS — M949 Disorder of cartilage, unspecified: Secondary | ICD-10-CM | POA: Diagnosis not present

## 2012-11-07 DIAGNOSIS — F333 Major depressive disorder, recurrent, severe with psychotic symptoms: Secondary | ICD-10-CM | POA: Diagnosis not present

## 2012-11-22 ENCOUNTER — Encounter: Payer: Self-pay | Admitting: Internal Medicine

## 2012-11-26 ENCOUNTER — Other Ambulatory Visit: Payer: Self-pay | Admitting: Geriatric Medicine

## 2012-11-26 MED ORDER — NIFEDIPINE ER 60 MG PO TB24: 60.0000 mg | ORAL_TABLET | Freq: Every day | ORAL | Status: DC

## 2012-12-03 DIAGNOSIS — F333 Major depressive disorder, recurrent, severe with psychotic symptoms: Secondary | ICD-10-CM | POA: Diagnosis not present

## 2012-12-10 DIAGNOSIS — F333 Major depressive disorder, recurrent, severe with psychotic symptoms: Secondary | ICD-10-CM | POA: Diagnosis not present

## 2013-01-06 NOTE — Patient Instructions (Signed)
Consider seeing Dr. Otelia Sergeant again of [facility]. Consider seeing a urologist about your urinary symptoms.

## 2013-01-07 ENCOUNTER — Encounter: Payer: Self-pay | Admitting: Internal Medicine

## 2013-01-08 ENCOUNTER — Non-Acute Institutional Stay: Payer: Medicare Other | Admitting: Internal Medicine

## 2013-01-08 ENCOUNTER — Encounter: Payer: Self-pay | Admitting: Internal Medicine

## 2013-01-08 VITALS — BP 144/84 | HR 76 | Temp 96.0°F | Ht 64.0 in | Wt 153.0 lb

## 2013-01-08 DIAGNOSIS — J069 Acute upper respiratory infection, unspecified: Secondary | ICD-10-CM | POA: Diagnosis not present

## 2013-01-08 NOTE — Progress Notes (Signed)
Subjective:    Patient ID: Tina Hampton, female    DOB: [birth year], 79 y.o.   MRN: [id number]  HPI Has had cold symptoms since 12/29/12.Heaad congestion, sore throat, No fever, cough, ears stopped up. Using Delsym. Cough is mainly a dry cough. Occasional ly producing small quantities of yellow sputum.  Current Outpatient Prescriptions on File Prior to Visit  Medication Sig Dispense Refill  . buPROPion (WELLBUTRIN XL) 150 MG 24 hr tablet Take 450 mg by mouth daily.      . calcitonin, salmon, (MIACALCIN/FORTICAL) 200 UNIT/ACT nasal spray One puff in alternat nostrils each day      . calcium-vitamin D (OSCAL WITH D) 500-200 MG-UNIT per tablet Take 1 tablet by mouth daily.      . cholecalciferol (VITAMIN D) 1000 UNITS tablet Take 1,000 Units by mouth daily.      Marland Kitchen conjugated estrogens (PREMARIN) vaginal cream Place 1 g vaginally See admin instructions. Pt uses twice weekly. No certain days      . docusate sodium (COLACE) 100 MG capsule Take 200 mg by mouth at bedtime.      . famotidine (PEPCID) 20 MG tablet Take 20 mg by mouth daily.      . Glucosamine HCl 1000 MG TABS Take 2,000 mg by mouth 2 (two) times daily.      . Multiple Vitamin (MULTIVITAMIN WITH MINERALS) TABS Take 1 tablet by mouth daily.      Marland Kitchen NIFEdipine (PROCARDIA-XL/ADALAT CC) 60 MG 24 hr tablet Take 1 tablet (60 mg total) by mouth daily.  90 tablet  3  . nystatin (MYCOSTATIN/NYSTOP) 100000 UNIT/GM POWD Apply daily to rash until resolved.  60 g  3  . Polyethyl Glycol-Propyl Glycol (SYSTANE) 0.4-0.3 % SOLN Apply 1 drop to eye 2 (two) times daily.      . pravastatin (PRAVACHOL) 20 MG tablet Take one tablet daily for cholesterol      . sertraline (ZOLOFT) 100 MG tablet Take one tablet each morning for nerves      . clonazePAM (KLONOPIN) 0.5 MG tablet Take 0.5 mg by mouth. Take half tablet 2-3 times a week for anxiety as needed       No current facility-administered medications on file prior to visit.    Review of Systems   Constitutional: Negative for unexpected weight change.  HENT: Positive for hearing loss. Negative for ear pain.   Eyes: Negative.   Respiratory: Negative.   Cardiovascular: Negative.   Gastrointestinal:       Heartburn. Constipation. History of hiatal hernia.  Genitourinary: Positive for hematuria.       Nocturia x5-6. Increased frequency in the daytime. Urge incontinence.  Musculoskeletal:       Chronic mild back discomfort. Some joint pains. No joint swelling, muscular pain, or muscular weakness.  Skin: Positive for rash.  Allergic/Immunologic: Negative.   Neurological: Negative.   Hematological: Negative.   Psychiatric/Behavioral: Positive for sleep disturbance.       Insomnia. Grieving over her husband's recent death.       Objective:BP 144/84  Pulse 76  Temp(Src) 96 F (35.6 C) (Oral)  Ht 5\' 4"  (1.626 m)  Wt 153 lb (69.4 kg)  BMI 26.25 kg/m2    Physical Exam  Constitutional: She is oriented to person, place, and time. She appears well-developed and well-nourished. No distress.  HENT:  Head: Normocephalic.  Right Ear: External ear normal.  Left Ear: External ear normal.  Nose: Nose normal.  Mouth/Throat: Oropharynx is clear and moist.   Mild hearing  loss bilaterally.  Eyes:  Wears corrective lenses. Left cataract present. Right pupil seems to be nonreactive to light.  Neck: Neck supple. No JVD present. No tracheal deviation present. No thyromegaly present.  Cardiovascular: Normal rate, regular rhythm, normal heart sounds and intact distal pulses.  Exam reveals no gallop and no friction rub.   No murmur heard. Pulmonary/Chest: Breath sounds normal. No respiratory distress. She has no wheezes. She has no rales. She exhibits no tenderness.  Abdominal: She exhibits no distension and no mass. There is no tenderness.  Musculoskeletal: She exhibits edema.  Flatfeet with collapsed arches.  Neurological: She is alert and oriented to person, place, and time. She has normal  reflexes. No cranial nerve deficit. Coordination normal.  Skin: Rash noted. No erythema. No pallor.  Multiple seborrheic keratoses. Pink rash across the lower abdomen of uncertain etiology.  Psychiatric: She has a normal mood and affect. Her behavior is normal. Judgment and thought content normal.          Assessment & Plan:  Acute upper respiratory infections of unspecified site: continue Delsym as needed. She is already improving. Reassured she does not have pneumonia.

## 2013-01-08 NOTE — Patient Instructions (Signed)
Continue Delsym as needed.

## 2013-01-22 ENCOUNTER — Encounter: Payer: Self-pay | Admitting: Internal Medicine

## 2013-02-06 DIAGNOSIS — F333 Major depressive disorder, recurrent, severe with psychotic symptoms: Secondary | ICD-10-CM | POA: Diagnosis not present

## 2013-02-11 ENCOUNTER — Other Ambulatory Visit: Payer: Self-pay | Admitting: *Deleted

## 2013-02-11 DIAGNOSIS — E785 Hyperlipidemia, unspecified: Secondary | ICD-10-CM | POA: Diagnosis not present

## 2013-02-12 ENCOUNTER — Other Ambulatory Visit: Payer: Self-pay

## 2013-02-12 MED ORDER — CALCITONIN (SALMON) 200 UNIT/ACT NA SOLN: 1.0000 | Freq: Every day | NASAL | Status: DC

## 2013-02-19 ENCOUNTER — Encounter: Payer: Self-pay | Admitting: Internal Medicine

## 2013-02-19 ENCOUNTER — Non-Acute Institutional Stay: Payer: Medicare Other | Admitting: Internal Medicine

## 2013-02-19 VITALS — BP 120/64 | HR 80 | Ht 64.0 in | Wt 154.0 lb

## 2013-02-19 DIAGNOSIS — F329 Major depressive disorder, single episode, unspecified: Secondary | ICD-10-CM

## 2013-02-19 DIAGNOSIS — J069 Acute upper respiratory infection, unspecified: Secondary | ICD-10-CM

## 2013-02-19 DIAGNOSIS — I10 Essential (primary) hypertension: Secondary | ICD-10-CM | POA: Diagnosis not present

## 2013-02-19 DIAGNOSIS — K219 Gastro-esophageal reflux disease without esophagitis: Secondary | ICD-10-CM | POA: Diagnosis not present

## 2013-02-19 DIAGNOSIS — E7800 Pure hypercholesterolemia, unspecified: Secondary | ICD-10-CM

## 2013-02-19 NOTE — Patient Instructions (Signed)
Continue current medications. 

## 2013-02-19 NOTE — Progress Notes (Signed)
Subjective:    Patient ID: Tina Hampton, female    DOB: May 26, 1925, 79 y.o.   MRN: [id number]  Chief Complaint  Patient presents with  . Medical Managment of Chronic Issues    blood pressure, depression, GERD, cholesterol    HPI Hypertension: controlled  Acute upper respiratory infections of unspecified site: resolved  GERD (gastroesophageal reflux disease): Stable  Depression: improved. Husband died earlier this year. She has adapted to a new life.  High cholesterol: controlled    Current Outpatient Prescriptions on File Prior to Visit  Medication Sig Dispense Refill  . aspirin 81 MG tablet Take 81 mg by mouth daily.      Marland Kitchen buPROPion (WELLBUTRIN XL) 150 MG 24 hr tablet Take 450 mg by mouth daily.      . calcitonin, salmon, (MIACALCIN/FORTICAL) 200 UNIT/ACT nasal spray Place 1 spray into the nose daily. One puff in alternat nostrils each day  3.7 mL  4  . calcium-vitamin D (OSCAL WITH D) 500-200 MG-UNIT per tablet Take 1 tablet by mouth daily.      . cholecalciferol (VITAMIN D) 1000 UNITS tablet Take 1,000 Units by mouth daily.      Marland Kitchen conjugated estrogens (PREMARIN) vaginal cream Place 1 g vaginally See admin instructions. Pt uses twice weekly. No certain days      . docusate sodium (COLACE) 100 MG capsule Take 200 mg by mouth at bedtime.      . famotidine (PEPCID) 20 MG tablet Take 20 mg by mouth daily.      . Glucosamine HCl 1000 MG TABS Take 2,000 mg by mouth 2 (two) times daily.      . Multiple Vitamin (MULTIVITAMIN WITH MINERALS) TABS Take 1 tablet by mouth daily.      Marland Kitchen NIFEdipine (PROCARDIA-XL/ADALAT CC) 60 MG 24 hr tablet Take 1 tablet (60 mg total) by mouth daily.  90 tablet  3  . nystatin (MYCOSTATIN/NYSTOP) 100000 UNIT/GM POWD Apply daily to rash until resolved.  60 g  3  . Polyethyl Glycol-Propyl Glycol (SYSTANE) 0.4-0.3 % SOLN Apply 1 drop to eye 2 (two) times daily.      . pravastatin (PRAVACHOL) 20 MG tablet Take one tablet daily for cholesterol      .  sertraline (ZOLOFT) 100 MG tablet Take one tablet each morning for nerves      . clonazePAM (KLONOPIN) 0.5 MG tablet Take 0.5 mg by mouth. Take half tablet 2-3 times a week for anxiety as needed       No current facility-administered medications on file prior to visit.    Review of Systems  Constitutional: Negative for unexpected weight change.  HENT: Positive for hearing loss. Negative for ear pain.   Eyes: Negative.   Respiratory: Negative.   Cardiovascular: Negative.   Gastrointestinal:       Heartburn. Constipation. History of hiatal hernia.  Genitourinary: Positive for hematuria.       Nocturia x5-6. Increased frequency in the daytime. Urge incontinence.  Musculoskeletal:       Chronic mild back discomfort. Some joint pains. No joint swelling, muscular pain, or muscular weakness.  Skin: Positive for rash.  Allergic/Immunologic: Negative.   Neurological: Negative.   Hematological: Negative.   Psychiatric/Behavioral: Positive for sleep disturbance.       Insomnia. Grieving over her husband's recent death.       Objective:   Physical Exam  Constitutional: She is oriented to person, place, and time. She appears well-developed and well-nourished. No distress.  HENT:  Head: Normocephalic.  Right Ear: External ear normal.  Left Ear: External ear normal.  Nose: Nose normal.  Mouth/Throat: Oropharynx is clear and moist.   Mild hearing loss bilaterally.  Eyes:  Wears corrective lenses. Left cataract present. Right pupil seems to be nonreactive to light.  Neck: Neck supple. No JVD present. No tracheal deviation present. No thyromegaly present.  Cardiovascular: Normal rate, regular rhythm, normal heart sounds and intact distal pulses.  Exam reveals no gallop and no friction rub.   No murmur heard. Pulmonary/Chest: Breath sounds normal. No respiratory distress. She has no wheezes. She has no rales. She exhibits no tenderness.  Abdominal: She exhibits no distension and no mass.  There is no tenderness.  Musculoskeletal: She exhibits edema.  Flatfeet with collapsed arches.  Neurological: She is alert and oriented to person, place, and time. She has normal reflexes. No cranial nerve deficit. Coordination normal.  Skin: Rash noted. No erythema. No pallor.  Multiple seborrheic keratoses. Pink rash across the lower abdomen of uncertain etiology.  Psychiatric: She has a normal mood and affect. Her behavior is normal. Judgment and thought content normal.          Assessment & Plan:  Hypertension: controlled  Acute upper respiratory infections of unspecified site: resolved  GERD (gastroesophageal reflux disease): controlled  Depression: improved  High cholesterol: controlled

## 2013-03-07 ENCOUNTER — Encounter: Payer: Self-pay | Admitting: Internal Medicine

## 2013-03-21 DIAGNOSIS — M67919 Unspecified disorder of synovium and tendon, unspecified shoulder: Secondary | ICD-10-CM | POA: Diagnosis not present

## 2013-03-21 DIAGNOSIS — M810 Age-related osteoporosis without current pathological fracture: Secondary | ICD-10-CM | POA: Diagnosis not present

## 2013-04-16 ENCOUNTER — Telehealth: Payer: Self-pay

## 2013-04-16 NOTE — Telephone Encounter (Signed)
Patient came by [facility] clinic asking if the Meloxicam 7.5mg  one twice daily given to her by Dr. Otelia Sergeant was ok with Dr. Chilton Si. This is helping with aches and pains.   Asked Dr. Chilton Si he ok'd the medication. Called patient gave her the message.

## 2013-04-30 DIAGNOSIS — M6281 Muscle weakness (generalized): Secondary | ICD-10-CM | POA: Diagnosis not present

## 2013-04-30 DIAGNOSIS — R293 Abnormal posture: Secondary | ICD-10-CM | POA: Diagnosis not present

## 2013-05-02 DIAGNOSIS — R293 Abnormal posture: Secondary | ICD-10-CM | POA: Diagnosis not present

## 2013-05-02 DIAGNOSIS — M6281 Muscle weakness (generalized): Secondary | ICD-10-CM | POA: Diagnosis not present

## 2013-05-07 DIAGNOSIS — M6281 Muscle weakness (generalized): Secondary | ICD-10-CM | POA: Diagnosis not present

## 2013-05-07 DIAGNOSIS — R293 Abnormal posture: Secondary | ICD-10-CM | POA: Diagnosis not present

## 2013-05-10 DIAGNOSIS — R293 Abnormal posture: Secondary | ICD-10-CM | POA: Diagnosis not present

## 2013-05-10 DIAGNOSIS — M6281 Muscle weakness (generalized): Secondary | ICD-10-CM | POA: Diagnosis not present

## 2013-05-13 DIAGNOSIS — R293 Abnormal posture: Secondary | ICD-10-CM | POA: Diagnosis not present

## 2013-05-13 DIAGNOSIS — M6281 Muscle weakness (generalized): Secondary | ICD-10-CM | POA: Diagnosis not present

## 2013-05-15 DIAGNOSIS — M6281 Muscle weakness (generalized): Secondary | ICD-10-CM | POA: Diagnosis not present

## 2013-05-15 DIAGNOSIS — R293 Abnormal posture: Secondary | ICD-10-CM | POA: Diagnosis not present

## 2013-05-21 DIAGNOSIS — R293 Abnormal posture: Secondary | ICD-10-CM | POA: Diagnosis not present

## 2013-05-21 DIAGNOSIS — M6281 Muscle weakness (generalized): Secondary | ICD-10-CM | POA: Diagnosis not present

## 2013-05-23 DIAGNOSIS — R293 Abnormal posture: Secondary | ICD-10-CM | POA: Diagnosis not present

## 2013-05-23 DIAGNOSIS — M6281 Muscle weakness (generalized): Secondary | ICD-10-CM | POA: Diagnosis not present

## 2013-05-28 DIAGNOSIS — R293 Abnormal posture: Secondary | ICD-10-CM | POA: Diagnosis not present

## 2013-05-28 DIAGNOSIS — M6281 Muscle weakness (generalized): Secondary | ICD-10-CM | POA: Diagnosis not present

## 2013-05-29 ENCOUNTER — Other Ambulatory Visit: Payer: Self-pay | Admitting: *Deleted

## 2013-05-29 MED ORDER — PRAVASTATIN SODIUM 20 MG PO TABS: ORAL_TABLET | ORAL | Status: DC

## 2013-05-31 DIAGNOSIS — R293 Abnormal posture: Secondary | ICD-10-CM | POA: Diagnosis not present

## 2013-05-31 DIAGNOSIS — M6281 Muscle weakness (generalized): Secondary | ICD-10-CM | POA: Diagnosis not present

## 2013-06-04 DIAGNOSIS — R293 Abnormal posture: Secondary | ICD-10-CM | POA: Diagnosis not present

## 2013-06-04 DIAGNOSIS — M6281 Muscle weakness (generalized): Secondary | ICD-10-CM | POA: Diagnosis not present

## 2013-06-07 DIAGNOSIS — R293 Abnormal posture: Secondary | ICD-10-CM | POA: Diagnosis not present

## 2013-06-07 DIAGNOSIS — M6281 Muscle weakness (generalized): Secondary | ICD-10-CM | POA: Diagnosis not present

## 2013-06-12 DIAGNOSIS — R293 Abnormal posture: Secondary | ICD-10-CM | POA: Diagnosis not present

## 2013-06-12 DIAGNOSIS — M6281 Muscle weakness (generalized): Secondary | ICD-10-CM | POA: Diagnosis not present

## 2013-06-18 DIAGNOSIS — R293 Abnormal posture: Secondary | ICD-10-CM | POA: Diagnosis not present

## 2013-06-18 DIAGNOSIS — M6281 Muscle weakness (generalized): Secondary | ICD-10-CM | POA: Diagnosis not present

## 2013-06-21 DIAGNOSIS — M6281 Muscle weakness (generalized): Secondary | ICD-10-CM | POA: Diagnosis not present

## 2013-06-21 DIAGNOSIS — R293 Abnormal posture: Secondary | ICD-10-CM | POA: Diagnosis not present

## 2013-06-24 DIAGNOSIS — H16219 Exposure keratoconjunctivitis, unspecified eye: Secondary | ICD-10-CM | POA: Diagnosis not present

## 2013-06-25 DIAGNOSIS — R293 Abnormal posture: Secondary | ICD-10-CM | POA: Diagnosis not present

## 2013-06-25 DIAGNOSIS — M6281 Muscle weakness (generalized): Secondary | ICD-10-CM | POA: Diagnosis not present

## 2013-06-28 DIAGNOSIS — R293 Abnormal posture: Secondary | ICD-10-CM | POA: Diagnosis not present

## 2013-06-28 DIAGNOSIS — M6281 Muscle weakness (generalized): Secondary | ICD-10-CM | POA: Diagnosis not present

## 2013-06-29 IMAGING — CR DG WRIST COMPLETE 3+V*L*
4 series · 4 of 4 positions shown · non-contrast
Comparison: None.

CLINICAL DATA: Fall, left hip pain

LEFT WRIST - COMPLETE 3+ VIEW

[x wrist pa left]
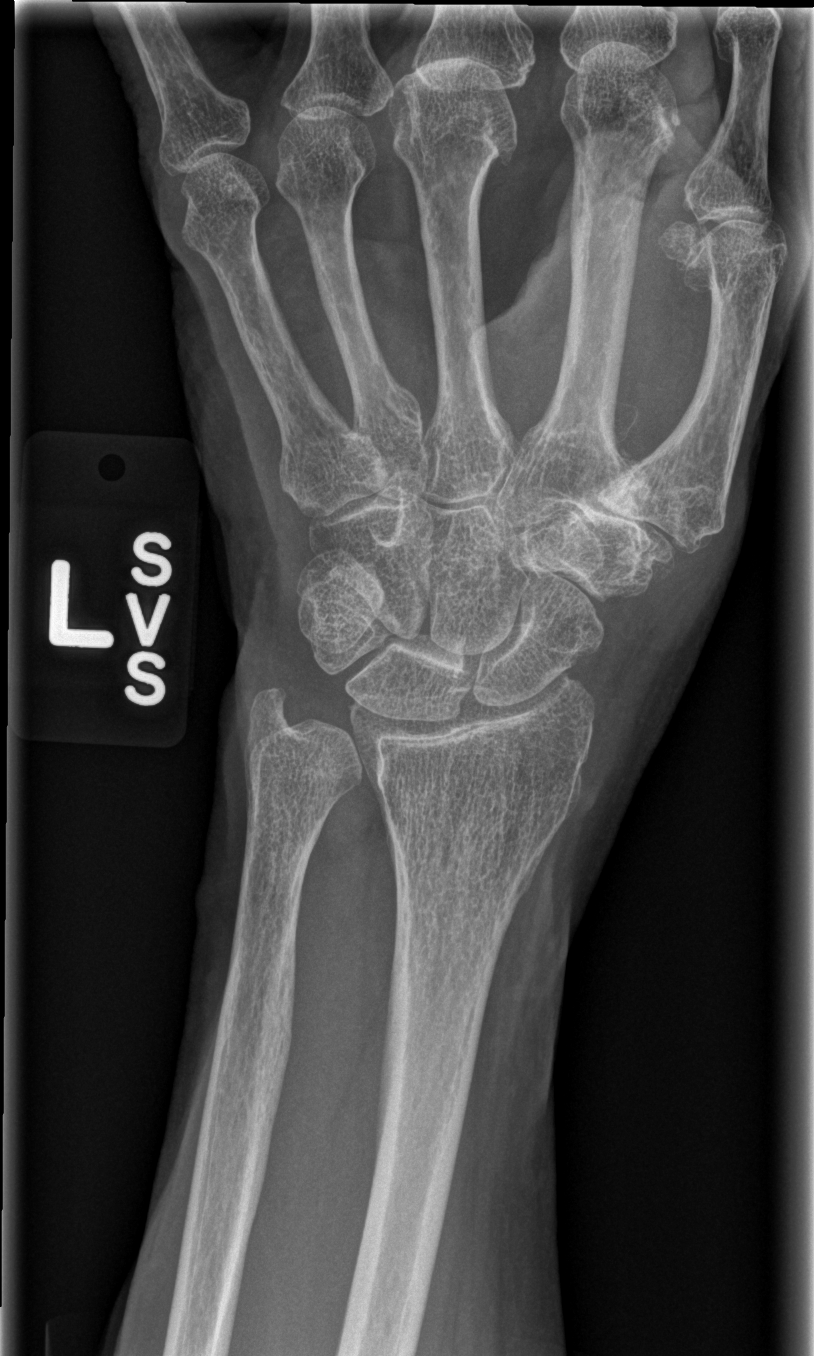

[x wrist obl left]
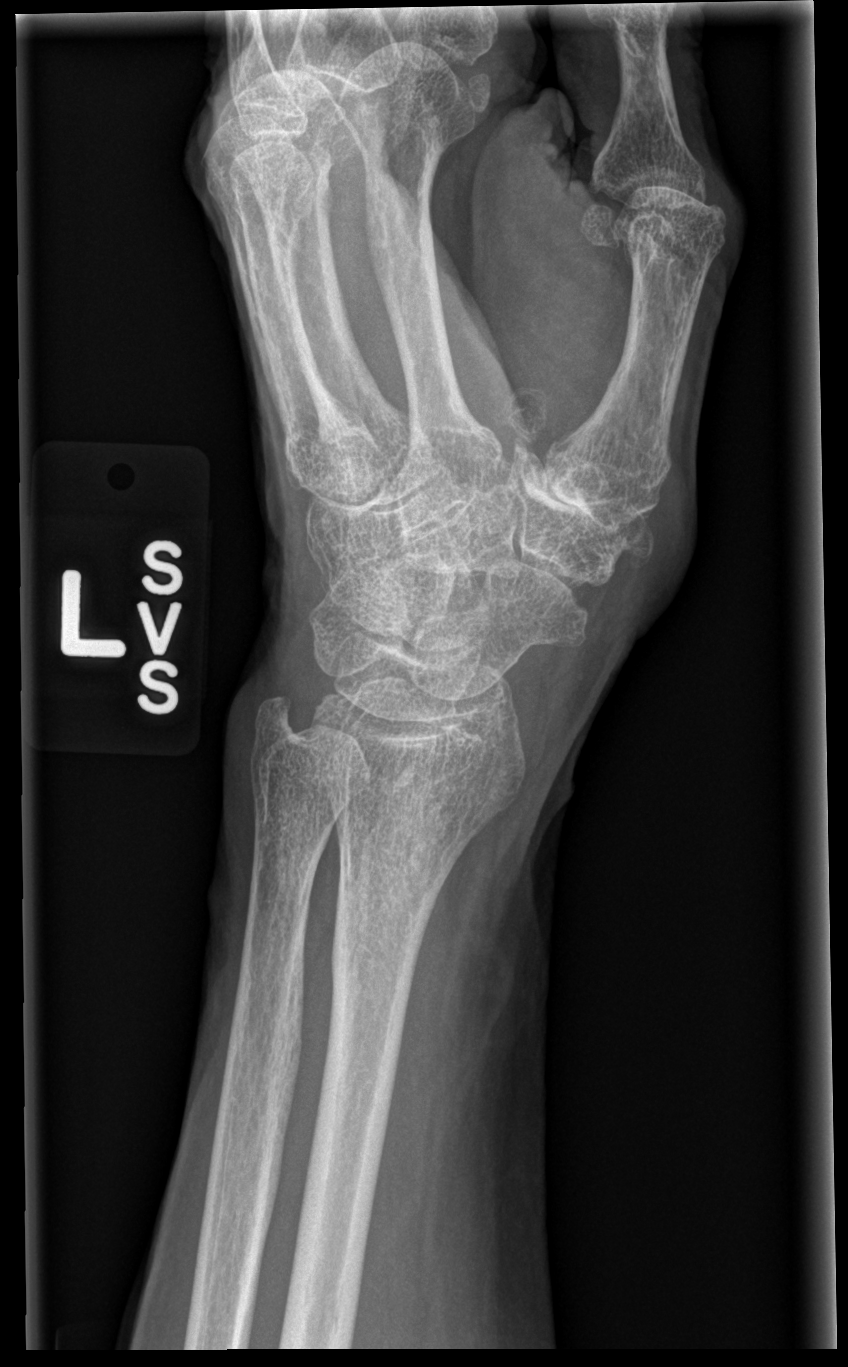

[x wrist lat left]
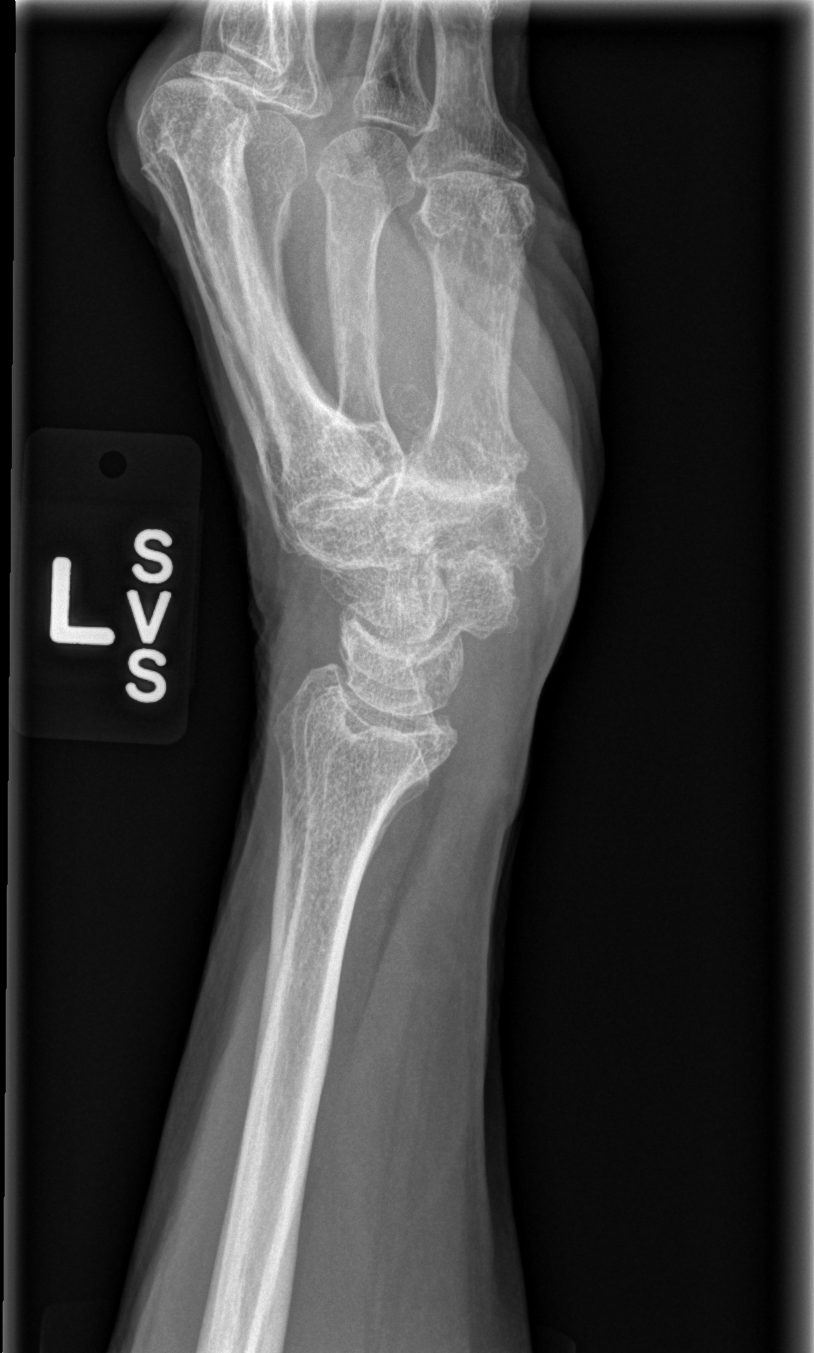

[x wrist navicular view left]
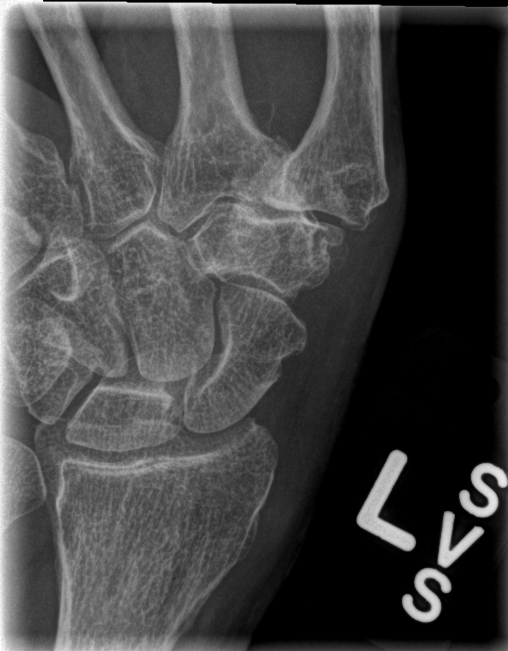

[4 of 4 positions shown; findings below may reference images not displayed]

FINDINGS: Four views of the left wrist submitted.  No acute
fracture or subluxation.  There is diffuse osteopenia.
Degenerative changes are noted first carpal metacarpal joint.
IMPRESSION: Diffuse osteopenia.  No acute fracture or subluxation.  Generative
changes first carpal metacarpal joint.

## 2013-06-29 IMAGING — CR DG CHEST 1V PORT
1 series · 1 of 1 positions shown · non-contrast
Comparison: None.

CLINICAL DATA: Cough.

PORTABLE CHEST - 1 VIEW

[AP]
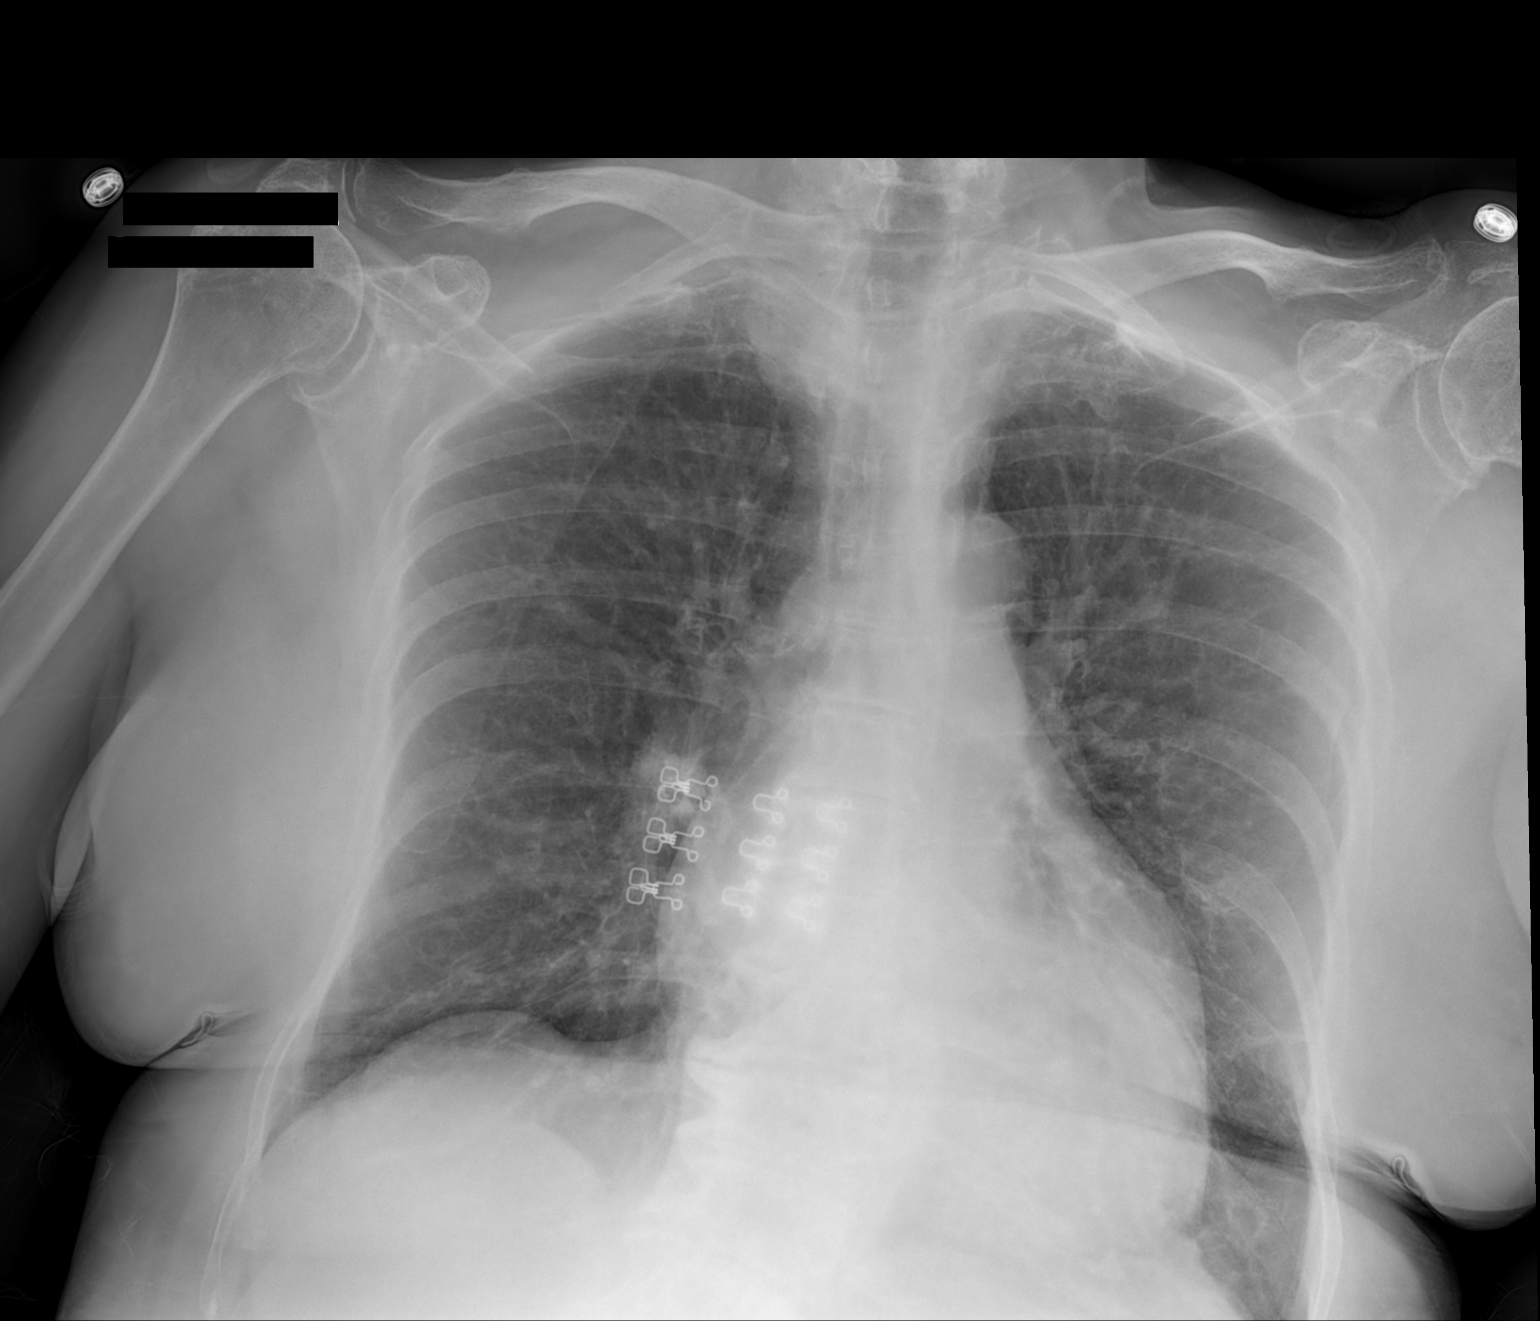

[1 of 1 positions shown; findings below may reference images not displayed]

FINDINGS: Heart is borderline in size.  No confluent airspace
opacity or effusion.  No acute bony abnormality. Mild
hyperinflation.
IMPRESSION: No acute findings.

## 2013-06-29 IMAGING — CR DG HIP (WITH OR WITHOUT PELVIS) 2-3V*L*
3 series · 3 of 3 positions shown · non-contrast
Comparison: None

CLINICAL DATA: Fall, left hip pain.

LEFT HIP - COMPLETE 2+ VIEW

[t pelvis ap]
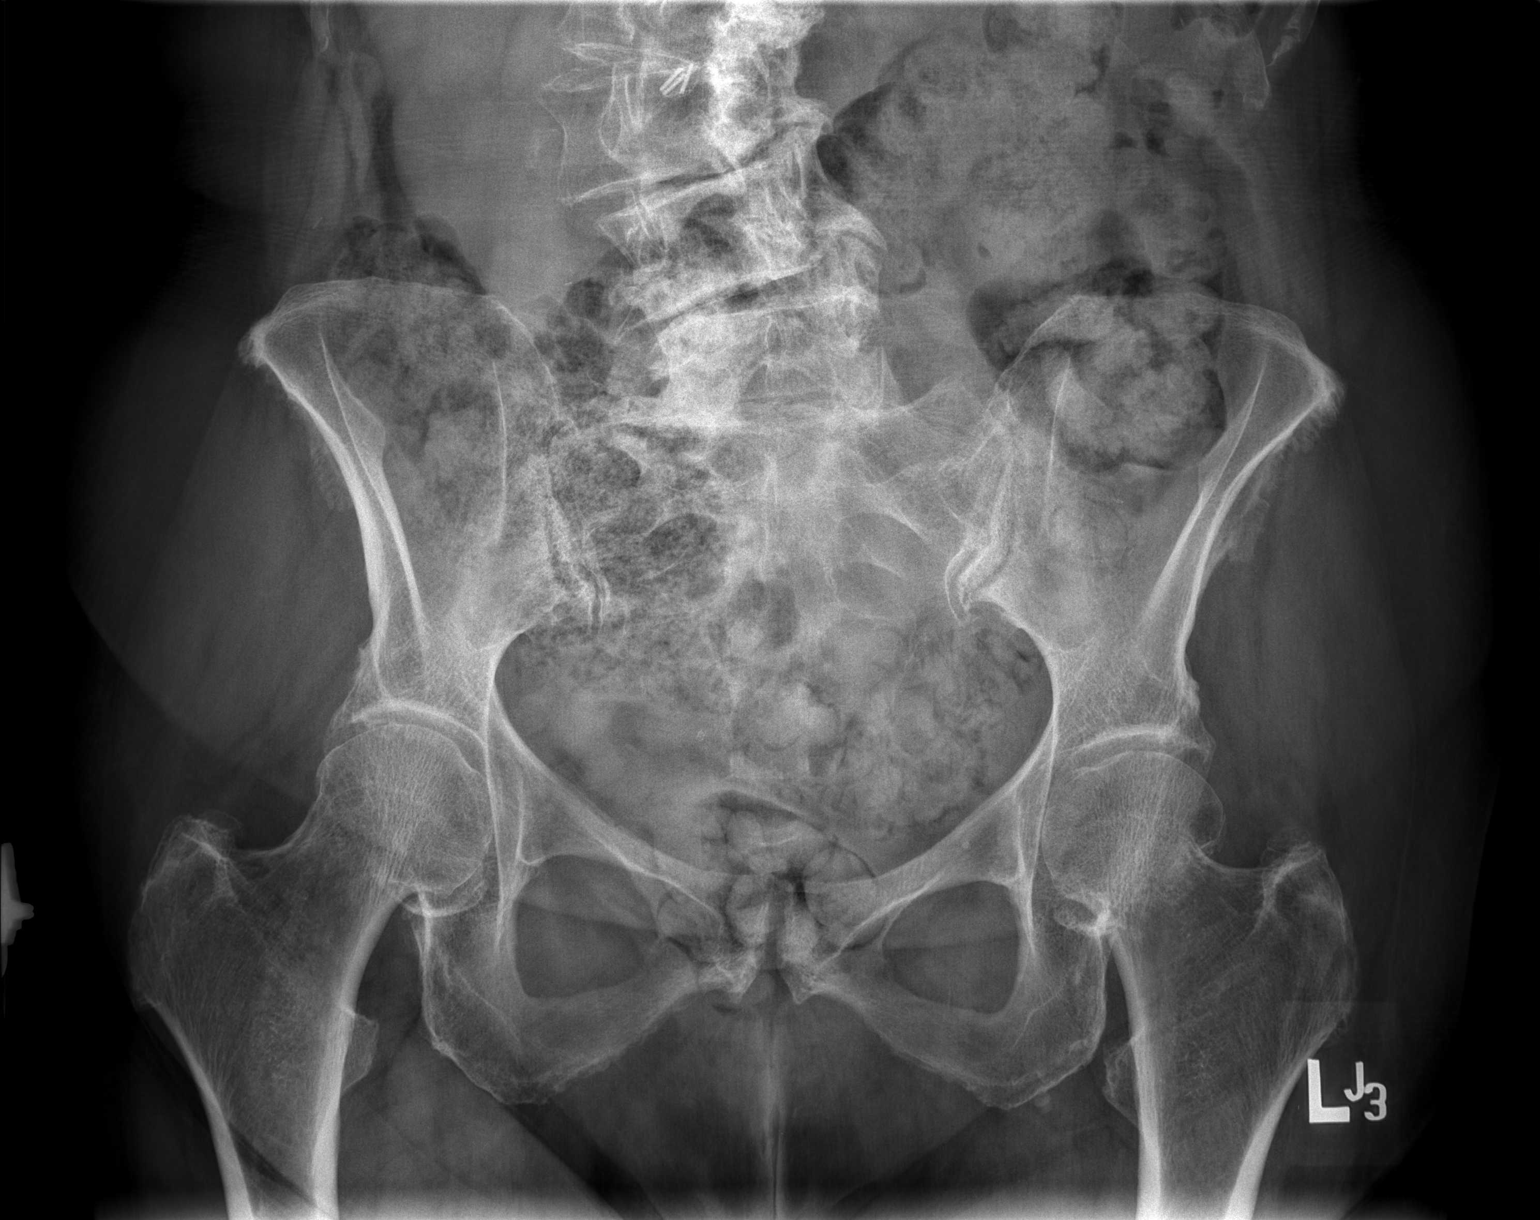

[t hip ap left]
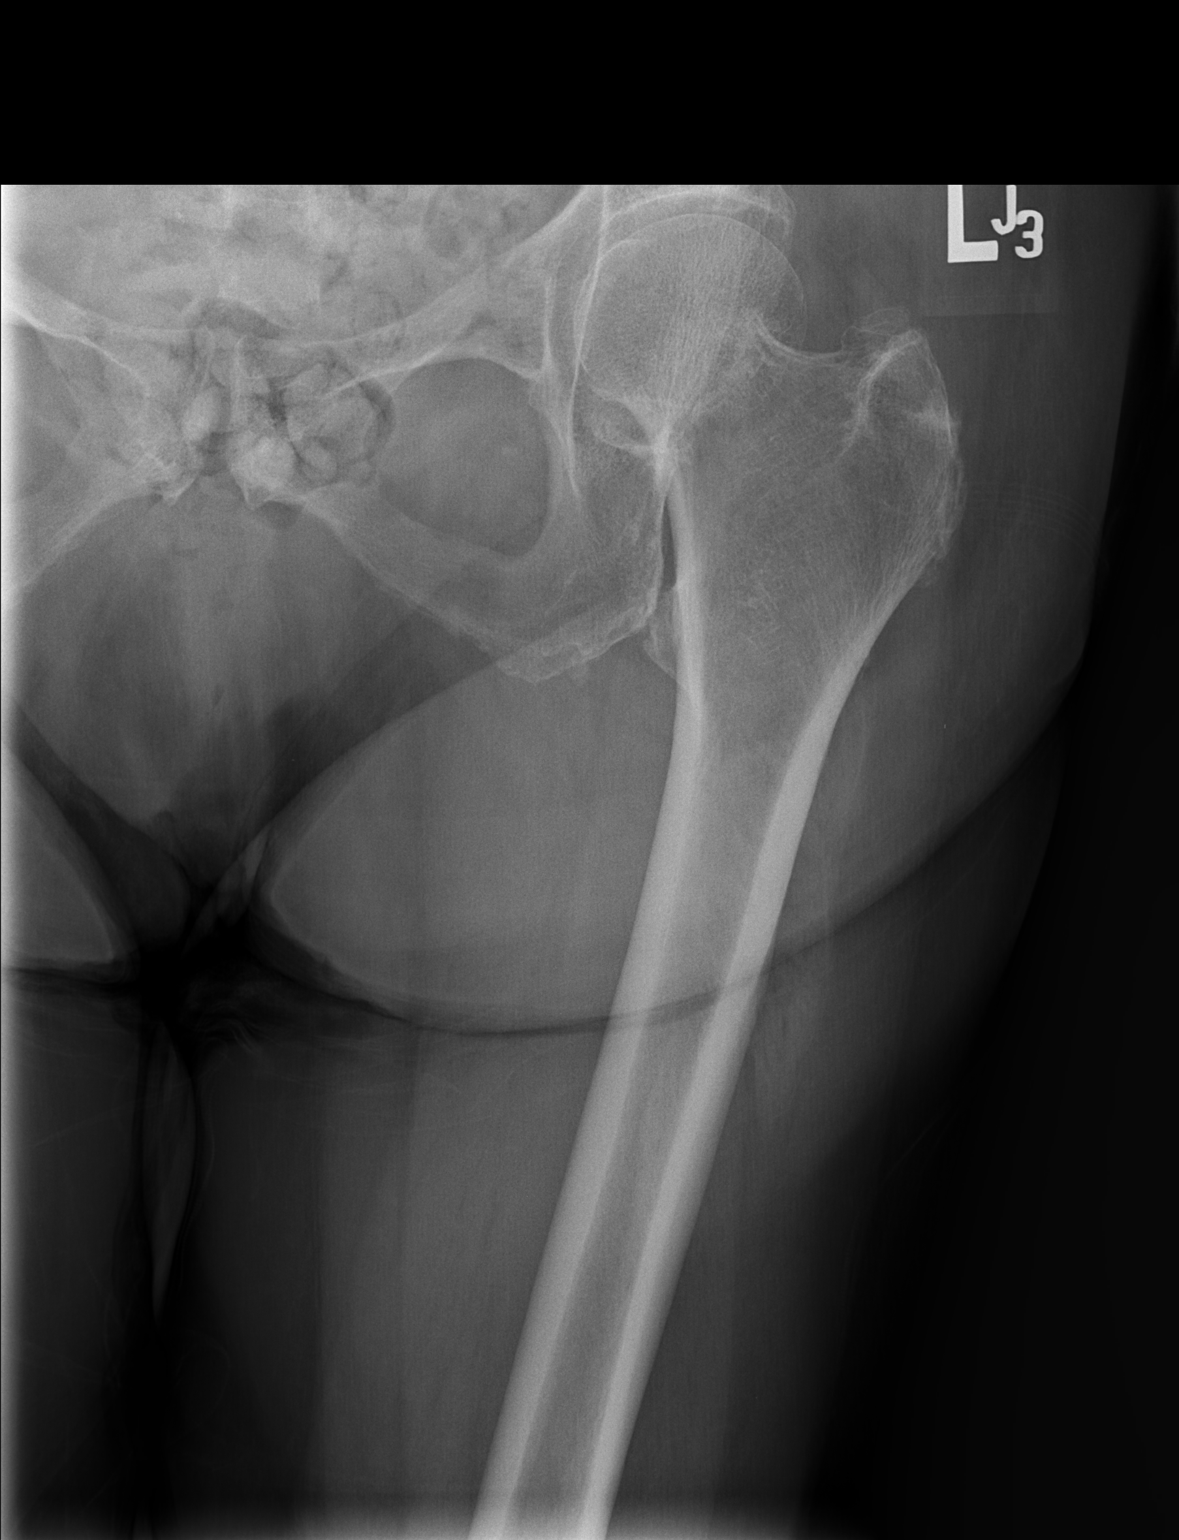

[t hip frog leg left]
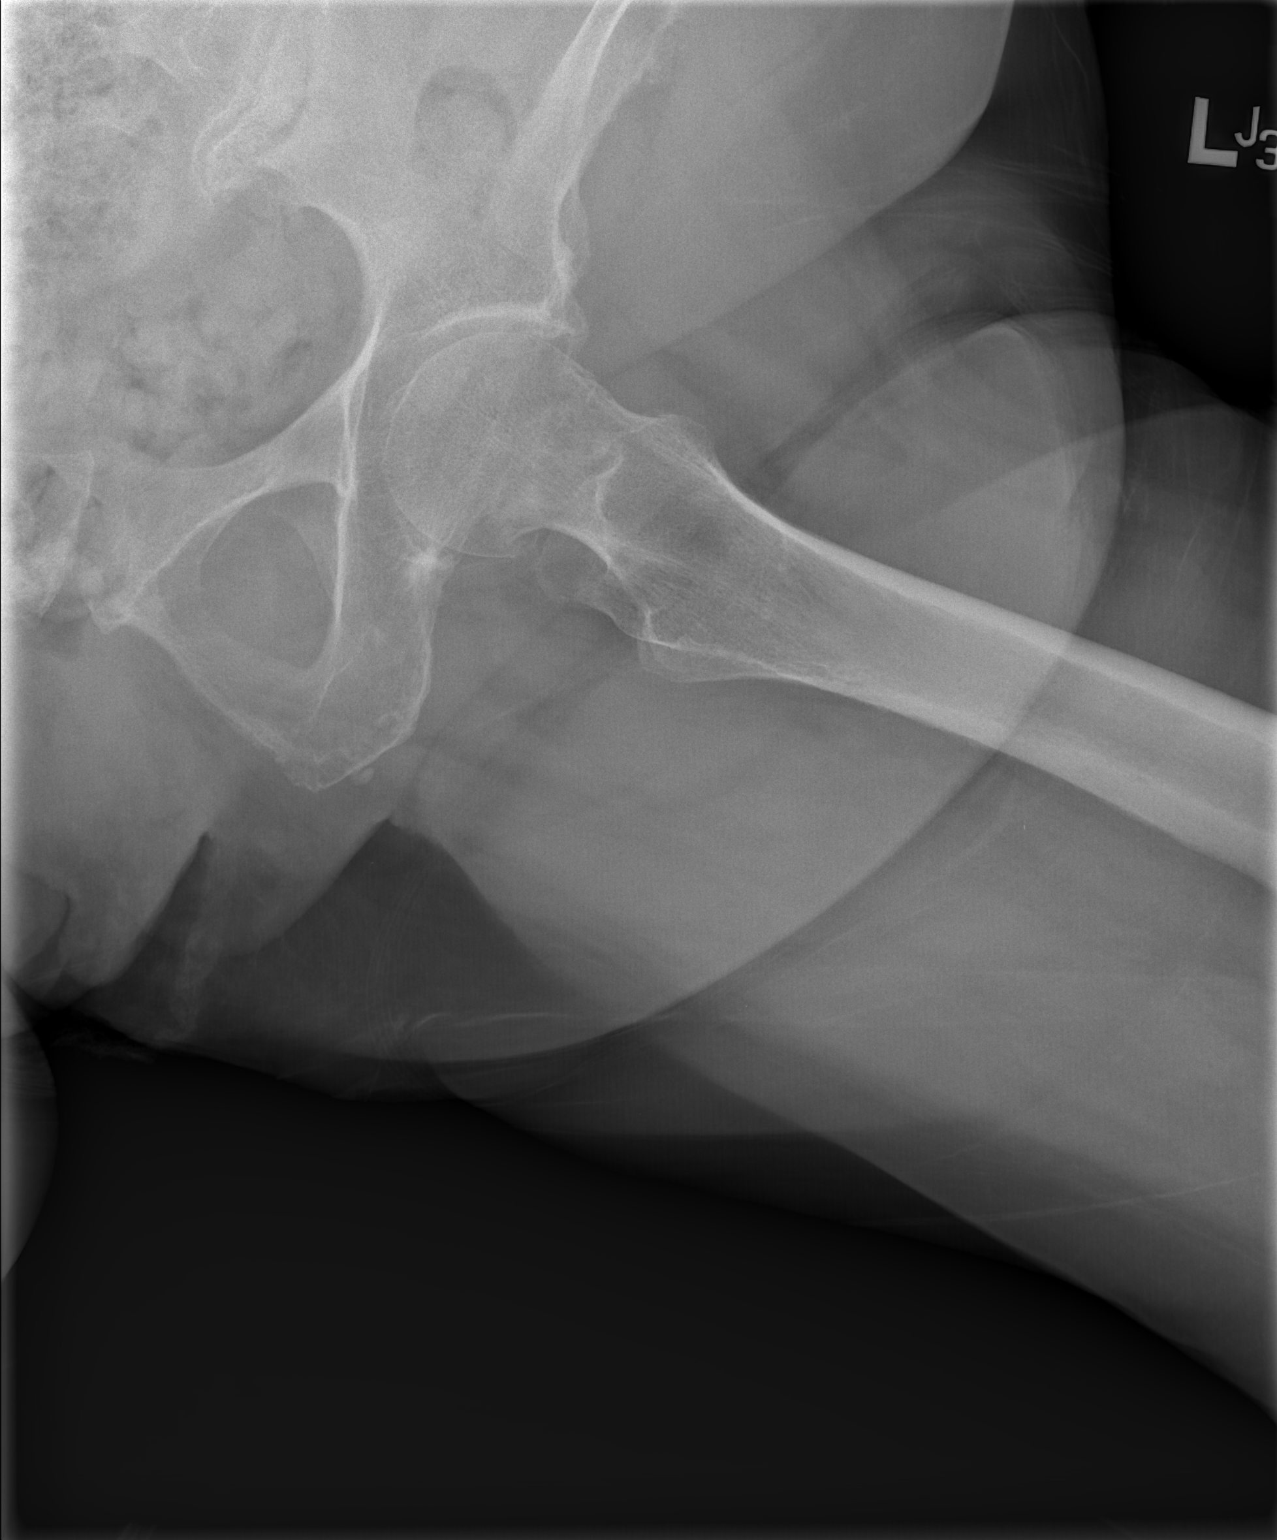

[3 of 3 positions shown; findings below may reference images not displayed]

FINDINGS: Degenerative joint disease changes in the hips
bilaterally.  Severe rightward scoliosis in the lumbar spine with
associated degenerative changes.

There is angulation in the lateral left femoral neck region.  This
is concerning for slightly impacted left femoral neck fracture.  No
dislocation.

Diffuse osteopenia.
IMPRESSION: Angulation of the cortex laterally in the subcapital left femoral
neck concerning for slightly impacted left femoral neck fracture.

## 2013-06-29 IMAGING — RF DG HIP OPERATIVE*L*
1 series · 2 of 2 positions shown · non-contrast
Comparison: Preoperative study 11/07/2011.

CLINICAL DATA: 86-year-old female status post left hip ORIF.

OPERATIVE LEFT HIP

[Series 1: run · 2 of 2 slices shown]
[im 1/2]
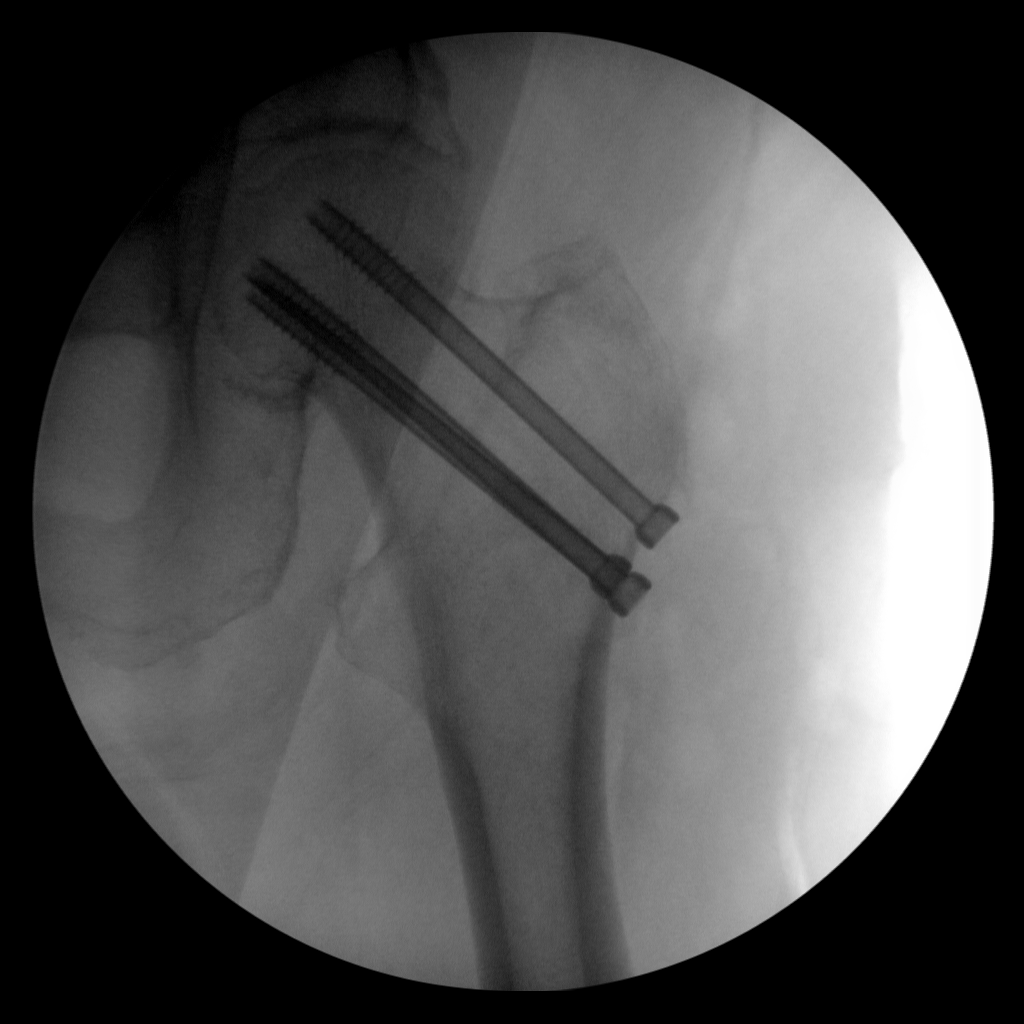
[im 2/2]
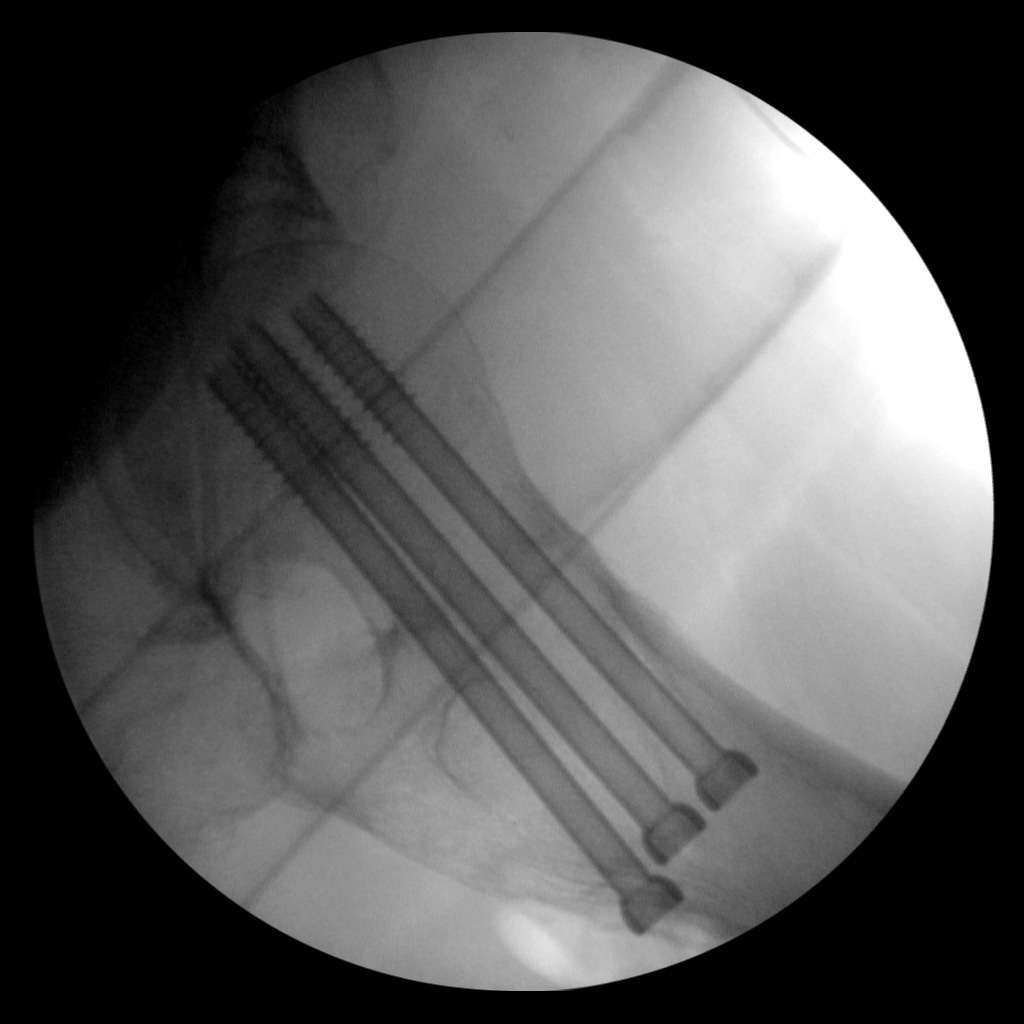

[2 of 2 positions shown; findings below may reference images not displayed]

FINDINGS: Two intraoperative fluoroscopic views of the proximal
left femur.  Three cannulated screws traverse the subcapital left
proximal femur fracture with near anatomic alignment.

Fluoroscopy time of 0.9 minutes was utilized.
IMPRESSION: Left femoral neck fracture ORIF with no adverse features.

## 2013-07-01 DIAGNOSIS — R293 Abnormal posture: Secondary | ICD-10-CM | POA: Diagnosis not present

## 2013-07-01 DIAGNOSIS — M6281 Muscle weakness (generalized): Secondary | ICD-10-CM | POA: Diagnosis not present

## 2013-08-01 DIAGNOSIS — M4120 Other idiopathic scoliosis, site unspecified: Secondary | ICD-10-CM | POA: Diagnosis not present

## 2013-08-01 DIAGNOSIS — M810 Age-related osteoporosis without current pathological fracture: Secondary | ICD-10-CM | POA: Diagnosis not present

## 2013-08-12 DIAGNOSIS — I10 Essential (primary) hypertension: Secondary | ICD-10-CM | POA: Diagnosis not present

## 2013-08-12 LAB — HEPATIC FUNCTION PANEL
ALK PHOS: 49 U/L (ref 25–125)
ALT: 15 U/L (ref 7–35)
AST: 21 U/L (ref 13–35)
Bilirubin, Total: 0.3 mg/dL

## 2013-08-12 LAB — BASIC METABOLIC PANEL
BUN: 20 mg/dL (ref 4–21)
Creatinine: 0.9 mg/dL (ref 0.5–1.1)
GLUCOSE: 91 mg/dL
Potassium: 4.4 mmol/L (ref 3.4–5.3)
SODIUM: 141 mmol/L (ref 137–147)

## 2013-08-12 LAB — LIPID PANEL
Cholesterol: 183 mg/dL (ref 0–200)
HDL: 63 mg/dL (ref 35–70)
LDL Cholesterol: 100 mg/dL
LDl/HDL Ratio: 2.9
Triglycerides: 99 mg/dL (ref 40–160)

## 2013-08-20 ENCOUNTER — Non-Acute Institutional Stay: Payer: Medicare Other | Admitting: Internal Medicine

## 2013-08-20 ENCOUNTER — Encounter: Payer: Self-pay | Admitting: Internal Medicine

## 2013-08-20 VITALS — BP 108/60 | HR 68 | Temp 98.3°F | Ht 64.0 in | Wt 154.0 lb

## 2013-08-20 DIAGNOSIS — M810 Age-related osteoporosis without current pathological fracture: Secondary | ICD-10-CM

## 2013-08-20 DIAGNOSIS — I10 Essential (primary) hypertension: Secondary | ICD-10-CM

## 2013-08-20 DIAGNOSIS — IMO0002 Reserved for concepts with insufficient information to code with codable children: Secondary | ICD-10-CM

## 2013-08-20 DIAGNOSIS — E7800 Pure hypercholesterolemia, unspecified: Secondary | ICD-10-CM | POA: Diagnosis not present

## 2013-08-20 DIAGNOSIS — N952 Postmenopausal atrophic vaginitis: Secondary | ICD-10-CM

## 2013-08-20 DIAGNOSIS — F32A Depression, unspecified: Secondary | ICD-10-CM

## 2013-08-20 DIAGNOSIS — R32 Unspecified urinary incontinence: Secondary | ICD-10-CM

## 2013-08-20 DIAGNOSIS — M542 Cervicalgia: Secondary | ICD-10-CM

## 2013-08-20 DIAGNOSIS — K219 Gastro-esophageal reflux disease without esophagitis: Secondary | ICD-10-CM | POA: Diagnosis not present

## 2013-08-20 DIAGNOSIS — F3289 Other specified depressive episodes: Secondary | ICD-10-CM

## 2013-08-20 DIAGNOSIS — R609 Edema, unspecified: Secondary | ICD-10-CM

## 2013-08-20 DIAGNOSIS — F329 Major depressive disorder, single episode, unspecified: Secondary | ICD-10-CM | POA: Diagnosis not present

## 2013-08-20 HISTORY — DX: Cervicalgia: M54.2

## 2013-08-20 NOTE — Progress Notes (Signed)
Patient ID: Tina Hampton, female   DOB: 1926/02/10, 80 y.o.   MRN: [id number]    Location:  [facility] (12)  PCP: Estill Dooms, MD  Code Status: living will  Extended Emergency Contact Information Primary Emergency Contact: [place], [place] [address] [place] [employer] Phone: [phone] Relation: Son  Allergies  Allergen Reactions  . Penicillins Other (See Comments)    CHILDHOOD REACTION    Chief Complaint  Patient presents with  . Medical Managment of Chronic Issues    Comprehensive Exam: blood pressure, depression, GERD, cholesterol     HPI:  Using meloxicam daily for neck pains. Pains in the shoulders have improved. Bursitis is better. Denies any increase in heartburn.  Hypertension: controlled  GERD (gastroesophageal reflux disease): controlled  High cholesterol: controlled  DDD (degenerative disc disease): back is doing better  Depression: controlled       Past Medical History  Diagnosis Date  . High cholesterol   . Hypertension   . GERD (gastroesophageal reflux disease)   . Hiatal hernia   . Scoliosis   . DDD (degenerative disc disease)   . Depression   . Pain in joint, pelvic region and thigh 11/12/2011  . Insomnia, unspecified 10/18/2011  . Abnormal mammogram, unspecified 04/19/2011  . Chronic rhinitis 09/28/2010  . Palpitations 09/29/2009  . Chronic kidney disease, stage II (mild) 04/03/2009  . Nocturia 09/19/2008  . Other abnormal blood chemistry 04/03/2007  . Pain in joint, shoulder region 08/15/2006  . Lumbago 07/21/2006  . Tension headache 10/04/2005  . Pain in limb 08/26/2005  . Diverticulosis of colon (without mention of hemorrhage) 06/17/2005  . Dermatophytosis of nail 01/21/2005  . Unspecified constipation 01/21/2005  . Backache, unspecified 01/21/2005  . Postmenopausal atrophic vaginitis 12/14/2004  . Senile osteoporosis 12/14/2004  . Unspecified cataract 11/26/2004  . Edema  11/26/2004  . Unspecified urinary incontinence 11/26/2004    Past Surgical History  Procedure Laterality Date  . Tonsillectomy      Dr.Joe Tamala Julian  . Breast surgery      benign tumor removal, left breast 1969  . Cholecystectomy  1990    Dr.Moore   . Vaginal prolapse repair  2002    Dr.Horback   . Bilateral eyelid surgery  2005    Dr.Scott  . Cataract extraction      Right  . Hip pinning,cannulated  11/07/2011    Procedure: CANNULATED HIP PINNING;  Surgeon: Jessy Oto, MD;  Location: [facility];  Service: Orthopedics;  Laterality: Left;  . Abdominal hysterectomy  1969    Dr.Braun   . Bilateral oophorectomy  2002  . Orif femoral neck fracture w/ dhs  11/07/2011    Dr.Nitka   . Colonoscopy  3/18/20008    inflammation, diverticular associated colitis -Dr. Ardis Hughs    CONSULTANTS Gastroenterology-Dr.Bruce Schier General Surgery-Dr. Laurance Flatten and Dr.Horback Dentist-Dr.Zalatel [facility] Cardiology-Dr.Rosenblatt Eye-Dr Schefkind Ophthalmology-Dr. Katy Fitch  GI-Dr. Ardis Hughs  Psychiatrist-Dr. Plovsky - Orthopedic-Dr. Louanne Skye  Urologist-Dr.Scott MacDiarmid  PAST PROCEDURES 2006-Colonoscopy-Dr.Schier 2006 Endoscopy - Dr. Arneta Cliche 2006-Exploratory surgery for stomach.-Dr.Schier 08-08-05 Mammogram -negative 09-29-2005 DEXA-Osteopenia 11-07-05 Lumbar x-ray - degenerative disc, spondylosis, scoliosis 05-30-06 Endoscopy-small hiatal hernia  Dr. Ardis Hughs 08-01-06 Colonoscopy -inflammation, diverticular associated colitis -Dr. Ardis Hughs 09-06-06 Mammogram -negative 10/20/2008-Bone Density 10/20/2008-Mammogram 10/25/2010 Bone Density Osteoporosis 10/25/2010 Mammogram-incomplete. Left breast, medial retroareolar location, question developing density. 10/27/2010 Left mammogram probably benign, follow-up mammogram in 6 months. 04/27/11 Mammogram negative 10/27/2011 Mammogram negative 11/07/11 X-ray  L wrist diffuse osteopenia. No acute fracture or subluxation.           L hip: angulation of the cortex laterally  in the subcapital left femoral neck concerning for slightly impacted left femoral neck fracture.            L hip operative: left femoral neck fracture ORIF whth no adverse features.  CXR no acute findings.  Social History: History   Social History  . Marital Status: Married    Spouse Name: N/A    Number of Children: N/A  . Years of Education: N/A   Occupational History  . retired Producer, television/film/video    Social History Main Topics  . Smoking status: Never Smoker   . Smokeless tobacco: Never Used  . Alcohol Use: No  . Drug Use: No  . Sexual Activity: No   Other Topics Concern  . None   Social History Narrative   Lives at [facility] since 04/14/2005   Widowed (husband expired 2014)   Has Living Will   Exercise: water aerobic  5 times a week   Walks with walker   Never smoked   Drinks moderate amount of caffeinate beverages daily   Alcohol none          Family History Family Status  Relation Status Death Age  . Mother Deceased   . Father Deceased   . Brother Deceased   . Son Alive   . Son Alive   . Son Alive   . Son Deceased 0    premature  . Son Deceased 4    reaction to  Penicillin    Family History  Problem Relation Age of Onset  . Heart disease Mother   . Stroke Father   . Heart disease Brother      Medications: Patient's Medications  New Prescriptions   No medications on file  Previous Medications   ASPIRIN 81 MG TABLET    Take 81 mg by mouth daily.   BUPROPION (WELLBUTRIN XL) 150 MG 24 HR TABLET    Take 450 mg by mouth daily.   CALCIUM-VITAMIN D (OSCAL WITH D) 500-200 MG-UNIT PER TABLET    Take 1 tablet by mouth daily.   CHOLECALCIFEROL (VITAMIN D) 1000 UNITS TABLET    Take 1,000 Units by mouth daily.   CLONAZEPAM (KLONOPIN) 0.5 MG TABLET    Take 0.5 mg by mouth. Take half tablet 2-3 times a week for anxiety as needed   CONJUGATED ESTROGENS (PREMARIN) VAGINAL CREAM    Place 1 g vaginally See admin instructions. Pt uses twice weekly. No certain  days   FAMOTIDINE (PEPCID) 20 MG TABLET    Take 20 mg by mouth daily.   GLUCOSAMINE HCL 1000 MG TABS    Take 2,000 mg by mouth 2 (two) times daily.   IBANDRONATE (BONIVA) 150 MG TABLET    Take 150 mg by mouth every 30 (thirty) days. Take in the morning with a full glass of water, on an empty stomach, and do not take anything else by mouth or lie down for the next 30 min.   MELOXICAM (MOBIC) 7.5 MG TABLET    Take one tablet daily   MULTIPLE VITAMIN (MULTIVITAMIN WITH MINERALS) TABS    Take 1 tablet by mouth daily.   NIFEDIPINE (PROCARDIA-XL/ADALAT CC) 60 MG 24 HR TABLET    Take 1 tablet (60 mg total) by mouth daily.   POLYETHYL GLYCOL-PROPYL GLYCOL (SYSTANE) 0.4-0.3 % SOLN    Apply 1 drop  to eye. To both eyes twice daily   PRAVASTATIN (PRAVACHOL) 20 MG TABLET    Take one tablet daily for cholesterol   SERTRALINE (ZOLOFT) 100 MG TABLET    Take one tablet each morning for nerves  Modified Medications   No medications on file  Discontinued Medications   CALCITONIN, SALMON, (MIACALCIN/FORTICAL) 200 UNIT/ACT NASAL SPRAY    Place 1 spray into the nose daily. One puff in alternat nostrils each day   DOCUSATE SODIUM (COLACE) 100 MG CAPSULE    Take 200 mg by mouth at bedtime.   NYSTATIN (MYCOSTATIN/NYSTOP) 100000 UNIT/GM POWD    Apply daily to rash until resolved.    Immunization History  Administered Date(s) Administered  . Influenza Whole 02/14/2012, 02/14/2013  . Pneumococcal Polysaccharide-23 05/16/1998  . Td 05/16/2004  . Zoster 05/16/2005     Review of Systems  Constitutional: Negative.  Negative for unexpected weight change.  HENT: Positive for hearing loss. Negative for ear pain.   Eyes: Positive for visual disturbance (corrective lenses).  Respiratory: Negative.   Cardiovascular: Negative.   Gastrointestinal:       Heartburn. Constipation. History of hiatal hernia.  Endocrine: Negative.   Genitourinary: Positive for hematuria.       Nocturia x5-6. Increased frequency in the  daytime. Urge incontinence.  Musculoskeletal:       Chronic mild back discomfort. Some joint pains. No joint swelling, muscular pain, or muscular weakness.  Skin: Positive for rash.  Allergic/Immunologic: Negative.   Neurological: Negative.   Hematological: Negative.   Psychiatric/Behavioral: Positive for sleep disturbance.       Insomnia. Grieving over her husband's recent death.      Filed Vitals:   Aug 25, 2013 1116  BP: 108/60  Pulse: 68  Temp: 98.3 F (36.8 C)  TempSrc: Oral  Height: 5\' 4"  (1.626 m)  Weight: 154 lb (69.854 kg)   Physical Exam  Constitutional: She is oriented to person, place, and time. She appears well-developed and well-nourished. No distress.  HENT:  Head: Atraumatic.  Right Ear: External ear normal.  Left Ear: External ear normal.  Nose: Nose normal.  Mouth/Throat: No oropharyngeal exudate.  Hearing loss  Eyes: Conjunctivae and EOM are normal. Pupils are equal, round, and reactive to light.  Neck: No JVD present. No tracheal deviation present. No thyromegaly present.  Cardiovascular: Normal rate, regular rhythm, normal heart sounds and intact distal pulses.  Exam reveals no gallop and no friction rub.   No murmur heard. Respiratory: No respiratory distress. She has no wheezes. She has no rales. She exhibits no tenderness.  GI: She exhibits no distension and no mass. There is no tenderness.  Musculoskeletal: Normal range of motion. She exhibits no edema and no tenderness.  Lymphadenopathy:    She has no cervical adenopathy.  Neurological: She is alert and oriented to person, place, and time. She has normal reflexes. She displays normal reflexes. No cranial nerve deficit. Coordination normal.  Skin: No rash noted. No erythema. No pallor.  Psychiatric: She has a normal mood and affect. Her behavior is normal. Judgment and thought content normal.       Labs reviewed: Nursing Home on 25-Aug-2013  Component Date Value Ref Range Status  . HM  Colonoscopy 08/01/2006 inflammation, diverticular associated colitis Dr. Ardis Hughs   Final  . Glucose 08/12/2013 91   Final  . BUN 08/12/2013 20  4 - 21 mg/dL Final  . Creatinine 08/12/2013 0.9  0.5 - 1.1 mg/dL Final  . Potassium 08/12/2013 4.4  3.4 -  5.3 mmol/L Final  . Sodium 08/12/2013 141  137 - 147 mmol/L Final  . LDl/HDL Ratio 08/12/2013 2.9   Final  . Triglycerides 08/12/2013 99  40 - 160 mg/dL Final  . Cholesterol 08/12/2013 183  0 - 200 mg/dL Final  . HDL 08/12/2013 63  35 - 70 mg/dL Final  . LDL Cholesterol 08/12/2013 100   Final  . Alkaline Phosphatase 08/12/2013 49  25 - 125 U/L Final  . ALT 08/12/2013 15  7 - 35 U/L Final  . AST 08/12/2013 21  13 - 35 U/L Final  . Bilirubin, Total 08/12/2013 0.3   Final     Assessment/Plan 1. Hypertension controlled  2. GERD (gastroesophageal reflux disease) controlled  3. High cholesterol controlled  4. DDD (degenerative disc disease) stable  5. Depression Improved. She would like to cut down medications  6. Cervicalgia iimproved  7. Edema stable  8. Unspecified urinary incontinence unchanged  9. Senile osteoporosis Last bone density 2014  10. Postmenopausal atrophic vaginitis Not using estrogen cream regularly, but asymptpomatic

## 2013-08-22 DIAGNOSIS — F333 Major depressive disorder, recurrent, severe with psychotic symptoms: Secondary | ICD-10-CM | POA: Diagnosis not present

## 2013-08-28 ENCOUNTER — Ambulatory Visit
Admission: RE | Admit: 2013-08-28 | Discharge: 2013-08-28 | Disposition: A | Discharge status: Home / Self Care | Payer: Medicare Other | Source: Ambulatory Visit | Attending: Nurse Practitioner | Admitting: Nurse Practitioner

## 2013-08-28 ENCOUNTER — Other Ambulatory Visit: Payer: Self-pay | Admitting: *Deleted

## 2013-08-28 ENCOUNTER — Ambulatory Visit (INDEPENDENT_AMBULATORY_CARE_PROVIDER_SITE_OTHER): Payer: Medicare Other | Admitting: Nurse Practitioner

## 2013-08-28 ENCOUNTER — Encounter: Payer: Self-pay | Admitting: Nurse Practitioner

## 2013-08-28 VITALS — BP 138/82 | HR 84 | Temp 97.2°F | Resp 10 | Wt 153.0 lb

## 2013-08-28 DIAGNOSIS — K449 Diaphragmatic hernia without obstruction or gangrene: Secondary | ICD-10-CM | POA: Diagnosis not present

## 2013-08-28 DIAGNOSIS — R1032 Left lower quadrant pain: Secondary | ICD-10-CM

## 2013-08-28 DIAGNOSIS — N281 Cyst of kidney, acquired: Secondary | ICD-10-CM | POA: Diagnosis not present

## 2013-08-28 DIAGNOSIS — K5730 Diverticulosis of large intestine without perforation or abscess without bleeding: Secondary | ICD-10-CM | POA: Diagnosis not present

## 2013-08-28 MED ORDER — IOHEXOL 300 MG/ML  SOLN
100.0000 mL | Freq: Once | INTRAMUSCULAR | Status: AC | PRN
  Administered 2013-08-28: 100 mL via INTRAVENOUS

## 2013-08-28 MED ORDER — HYDROCODONE-ACETAMINOPHEN 5-325 MG PO TABS: 1.0000 | ORAL_TABLET | Freq: Four times a day (QID) | ORAL | Status: AC | PRN

## 2013-08-28 NOTE — Patient Instructions (Signed)
Will get blood work today and ultrasound of abdomen to look at area  To get over the counter florastor and take twice daily   Abdominal Pain, Adult Many things can cause abdominal pain. Usually, abdominal pain is not caused by a disease and will improve without treatment. It can often be observed and treated at home. Your health care provider will do a physical exam and possibly order blood tests and X-rays to help determine the seriousness of your pain. However, in many cases, more time must pass before a clear cause of the pain can be found. Before that point, your health care provider may not know if you need more testing or further treatment. HOME CARE INSTRUCTIONS  Monitor your abdominal pain for any changes. The following actions may help to alleviate any discomfort you are experiencing:  Only take over-the-counter or prescription medicines as directed by your health care provider.  Do not take laxatives unless directed to do so by your health care provider.  Try a clear liquid diet (broth, tea, or water) as directed by your health care provider. Slowly move to a bland diet as tolerated. SEEK MEDICAL CARE IF:  You have unexplained abdominal pain.  You have abdominal pain associated with nausea or diarrhea.  You have pain when you urinate or have a bowel movement.  You experience abdominal pain that wakes you in the night.  You have abdominal pain that is worsened or improved by eating food.  You have abdominal pain that is worsened with eating fatty foods. SEEK IMMEDIATE MEDICAL CARE IF:   Your pain does not go away within 2 hours.  You have a fever.  You keep throwing up (vomiting).  Your pain is felt only in portions of the abdomen, such as the right side or the left lower portion of the abdomen.  You pass bloody or black tarry stools. MAKE SURE YOU:  Understand these instructions.   Will watch your condition.   Will get help right away if you are not doing well  or get worse.  Document Released: 02/09/2005 Document Revised: 02/20/2013 Document Reviewed: 01/09/2013 [facility] Patient Information 2014 [place].

## 2013-08-28 NOTE — Progress Notes (Signed)
Patient ID: Tina Hampton, female   DOB: 1925-07-12, 80 y.o.   MRN: [id number]    Allergies  Allergen Reactions  . Penicillins Other (See Comments)    CHILDHOOD REACTION    Chief Complaint  Patient presents with  . Diverticulitis    Patient c/o diverticulitis symptoms, left lower abdominal pain x 1 week     HPI: Patient is a 80 y.o. female seen in the office today for lower abdomen pain, pt just had her annual exam last week and was doing well, since then she has had worsening left lower quad pain. She has an hx of diverticulitis, normally she gets a little pain for a day or so and it takes care of itself however this time it hurts very bad (6-7/10)  and is getting worse. trys to eat food proper diet but is not working; No fever or chills. Not associated with eating, but decrease appetite, no nausea or vomiting, no diarrhea   Review of Systems:  Review of Systems  Constitutional: Negative for fever, chills and malaise/fatigue.  Respiratory: Negative for shortness of breath.   Cardiovascular: Negative for chest pain.  Gastrointestinal: Positive for abdominal pain. Negative for heartburn, nausea, vomiting, diarrhea, constipation, blood in stool and melena.  Genitourinary: Negative for dysuria and urgency.  Skin: Negative for itching and rash.  Neurological: Negative for dizziness and weakness.     Past Medical History  Diagnosis Date  . High cholesterol   . Hypertension   . GERD (gastroesophageal reflux disease)   . Hiatal hernia   . Scoliosis   . DDD (degenerative disc disease)   . Depression   . Pain in joint, pelvic region and thigh 11/12/2011  . Insomnia, unspecified 10/18/2011  . Abnormal mammogram, unspecified 04/19/2011  . Chronic rhinitis 09/28/2010  . Palpitations 09/29/2009  . Chronic kidney disease, stage II (mild) 04/03/2009  . Nocturia 09/19/2008  . Other abnormal blood chemistry 04/03/2007  . Pain in joint, shoulder region 08/15/2006  . Tension headache 10/04/2005    . Pain in limb 08/26/2005  . Diverticulosis of colon (without mention of hemorrhage) 06/17/2005  . Dermatophytosis of nail 01/21/2005  . Unspecified constipation 01/21/2005  . Backache, unspecified 01/21/2005  . Postmenopausal atrophic vaginitis 12/14/2004  . Senile osteoporosis 12/14/2004  . Unspecified cataract 11/26/2004  . Edema 11/26/2004  . Unspecified urinary incontinence 11/26/2004   Past Surgical History  Procedure Laterality Date  . Tonsillectomy      Dr.Joe Tamala Julian  . Breast surgery      benign tumor removal, left breast 1969  . Cholecystectomy  1990    Dr.Moore   . Vaginal prolapse repair  2002    Dr.Horback   . Bilateral eyelid surgery  2005    Dr.Scott  . Cataract extraction      Right  . Hip pinning,cannulated  11/07/2011    Procedure: CANNULATED HIP PINNING;  Surgeon: Jessy Oto, MD;  Location: [facility];  Service: Orthopedics;  Laterality: Left;  . Abdominal hysterectomy  1969    Dr.Braun   . Bilateral oophorectomy  2002  . Orif femoral neck fracture w/ dhs  11/07/2011    Dr.Nitka   . Colonoscopy  3/18/20008    inflammation, diverticular associated colitis -Dr. Ardis Hughs   Social History:   reports that she has never smoked. She has never used smokeless tobacco. She reports that she does not drink alcohol or use illicit drugs.  Family History  Problem Relation Age of Onset  . Heart disease Mother   .  Stroke Father   . Heart disease Brother     Medications: Patient's Medications  New Prescriptions   No medications on file  Previous Medications   ASPIRIN 81 MG TABLET    Take 81 mg by mouth daily.   BUPROPION (WELLBUTRIN XL) 150 MG 24 HR TABLET    Take 450 mg by mouth daily.   CALCIUM CARBONATE-VITAMIN D (CALCIUM 600+D3 PO)    Take by mouth 2 (two) times daily.   CHOLECALCIFEROL (VITAMIN D) 1000 UNITS TABLET    Take 1,000 Units by mouth daily.   CLONAZEPAM (KLONOPIN) 0.5 MG TABLET    Take 0.5 mg by mouth. Take half tablet 2-3 times a week for anxiety as needed    CONJUGATED ESTROGENS (PREMARIN) VAGINAL CREAM    Place 1 g vaginally See admin instructions. Pt uses twice weekly. No certain days   FAMOTIDINE (PEPCID) 20 MG TABLET    Take 20 mg by mouth daily.   GLUCOSAMINE HCL 1000 MG TABS    Take 2,000 mg by mouth 2 (two) times daily.   IBANDRONATE (BONIVA) 150 MG TABLET    Take 150 mg by mouth every 30 (thirty) days. Take in the morning with a full glass of water, on an empty stomach, and do not take anything else by mouth or lie down for the next 30 min.   MELOXICAM (MOBIC) 7.5 MG TABLET    Take one tablet daily   MULTIPLE VITAMIN (MULTIVITAMIN WITH MINERALS) TABS    Take 1 tablet by mouth daily.   NIFEDIPINE (PROCARDIA-XL/ADALAT CC) 60 MG 24 HR TABLET    Take 1 tablet (60 mg total) by mouth daily.   POLYETHYL GLYCOL-PROPYL GLYCOL (SYSTANE) 0.4-0.3 % SOLN    Apply 1 drop to eye. To both eyes twice daily   PRAVASTATIN (PRAVACHOL) 20 MG TABLET    Take one tablet daily for cholesterol   SERTRALINE (ZOLOFT) 100 MG TABLET    Take 1 1/2  tablet each morning for nerves  Modified Medications   No medications on file  Discontinued Medications   CALCIUM-VITAMIN D (OSCAL WITH D) 500-200 MG-UNIT PER TABLET    Take 1 tablet by mouth daily.     Physical Exam:  Filed Vitals:   08/28/13 0842  BP: 138/82  Pulse: 84  Temp: 97.2 F (36.2 C)  TempSrc: Oral  Resp: 10  Weight: 153 lb (69.4 kg)  SpO2: 98%    Physical Exam  Constitutional: She is oriented to person, place, and time and well-developed, well-nourished, and in no distress.  HENT:  Head: Normocephalic and atraumatic.  Eyes: Conjunctivae and EOM are normal. Pupils are equal, round, and reactive to light.  Neck: Normal range of motion. Neck supple. No thyromegaly present.  Cardiovascular: Normal rate, regular rhythm and normal heart sounds.   Pulmonary/Chest: Effort normal and breath sounds normal. No respiratory distress.  Abdominal: Soft. Bowel sounds are normal. She exhibits no distension and no  mass. There is no tenderness. There is no rebound and no guarding.  Musculoskeletal: She exhibits no edema and no tenderness.  Lymphadenopathy:    She has no cervical adenopathy.  Neurological: She is alert and oriented to person, place, and time.  Skin: Skin is warm and dry. She is not diaphoretic.  Psychiatric: Affect normal.     Labs reviewed: Basic Metabolic Panel:  Recent Labs  08/12/13  NA 141  K 4.4  BUN 20  CREATININE 0.9   Liver Function Tests:  Recent Labs  08/12/13  AST  21  ALT 15  ALKPHOS 49    Recent Labs  08/12/13  CHOL 183  HDL 63  LDLCALC 100  TRIG 99    Assessment/Plan 1. Abdominal pain, left lower quadrant -no tenderness on exam, regular BMs, no association with food except for decrease appetite, will get US Abdomen Complete; today and  CBC With differential/Platelet for further work up -for now to take florastore BID (reports she took this in the past with resolution of symptoms- per old records) and  HYDROcodone-acetaminophen (NORCO/VICODIN) 5-325 MG per tablet; Take 1 tablet by mouth every 6 (six) hours as needed.  Dispense: 15 tablet; Refill: 0 for pain -given return precautions and when to seek emergency care; pt understands, will have her follow up in clinic in 1 week

## 2013-08-29 LAB — CBC WITH DIFFERENTIAL
BASOS ABS: 0 10*3/uL (ref 0.0–0.2)
Basos: 0 %
EOS ABS: 0.3 10*3/uL (ref 0.0–0.4)
EOS: 4 %
HCT: 39 % (ref 34.0–46.6)
Hemoglobin: 13.3 g/dL (ref 11.1–15.9)
IMMATURE GRANS (ABS): 0 10*3/uL (ref 0.0–0.1)
IMMATURE GRANULOCYTES: 0 %
LYMPHS ABS: 1.3 10*3/uL (ref 0.7–3.1)
Lymphs: 16 %
MCH: 30.9 pg (ref 26.6–33.0)
MCHC: 34.1 g/dL (ref 31.5–35.7)
MCV: 91 fL (ref 79–97)
MONOS ABS: 0.6 10*3/uL (ref 0.1–0.9)
Monocytes: 8 %
NEUTROS PCT: 72 %
Neutrophils Absolute: 5.6 10*3/uL (ref 1.4–7.0)
PLATELETS: 309 10*3/uL (ref 150–379)
RBC: 4.31 x10E6/uL (ref 3.77–5.28)
RDW: 13 % (ref 12.3–15.4)
WBC: 7.9 10*3/uL (ref 3.4–10.8)

## 2013-09-04 ENCOUNTER — Ambulatory Visit (INDEPENDENT_AMBULATORY_CARE_PROVIDER_SITE_OTHER): Payer: Medicare Other | Admitting: Nurse Practitioner

## 2013-09-04 ENCOUNTER — Encounter: Payer: Self-pay | Admitting: Nurse Practitioner

## 2013-09-04 VITALS — BP 124/72 | HR 72 | Temp 97.1°F | Resp 10 | Wt 153.0 lb

## 2013-09-04 DIAGNOSIS — K5730 Diverticulosis of large intestine without perforation or abscess without bleeding: Secondary | ICD-10-CM

## 2013-09-04 NOTE — Progress Notes (Signed)
Patient ID: Tina Hampton, female   DOB: 1925-12-07, 80 y.o.   MRN: [id number]    Allergies  Allergen Reactions  . Penicillins Other (See Comments)    CHILDHOOD REACTION    Chief Complaint  Patient presents with  . Follow-up    1 week follow-up, patient states " I am feeling better"    HPI: Patient is a 80 y.o. female seen in the office today for follow up abdominal discomfort, started taking florastor twice daily and she reports since she started that she has been much better.  CT scan and CBC done without any acute finding. Pt without any new symptoms. Appetite is good, no pain.   Review of Systems:  Review of Systems  Constitutional: Negative for fever, chills, weight loss and malaise/fatigue.  Respiratory: Negative for shortness of breath.   Cardiovascular: Negative for chest pain.  Gastrointestinal: Negative for heartburn, nausea, vomiting, abdominal pain, diarrhea, constipation, blood in stool and melena.  Genitourinary: Negative for dysuria and urgency.  Skin: Negative for itching and rash.  Neurological: Negative for dizziness and weakness.     Past Medical History  Diagnosis Date  . High cholesterol   . Hypertension   . GERD (gastroesophageal reflux disease)   . Hiatal hernia   . Scoliosis   . DDD (degenerative disc disease)   . Depression   . Pain in joint, pelvic region and thigh 11/12/2011  . Insomnia, unspecified 10/18/2011  . Abnormal mammogram, unspecified 04/19/2011  . Chronic rhinitis 09/28/2010  . Palpitations 09/29/2009  . Chronic kidney disease, stage II (mild) 04/03/2009  . Nocturia 09/19/2008  . Other abnormal blood chemistry 04/03/2007  . Pain in joint, shoulder region 08/15/2006  . Tension headache 10/04/2005  . Pain in limb 08/26/2005  . Diverticulosis of colon (without mention of hemorrhage) 06/17/2005  . Dermatophytosis of nail 01/21/2005  . Unspecified constipation 01/21/2005  . Backache, unspecified 01/21/2005  . Postmenopausal atrophic vaginitis  12/14/2004  . Senile osteoporosis 12/14/2004  . Unspecified cataract 11/26/2004  . Edema 11/26/2004  . Unspecified urinary incontinence 11/26/2004   Past Surgical History  Procedure Laterality Date  . Tonsillectomy      Dr.Joe Tamala Julian  . Breast surgery      benign tumor removal, left breast 1969  . Cholecystectomy  1990    Dr.Moore   . Vaginal prolapse repair  2002    Dr.Horback   . Bilateral eyelid surgery  2005    Dr.Scott  . Cataract extraction      Right  . Hip pinning,cannulated  11/07/2011    Procedure: CANNULATED HIP PINNING;  Surgeon: Jessy Oto, MD;  Location: [facility];  Service: Orthopedics;  Laterality: Left;  . Abdominal hysterectomy  1969    Dr.Braun   . Bilateral oophorectomy  2002  . Orif femoral neck fracture w/ dhs  11/07/2011    Dr.Nitka   . Colonoscopy  3/18/20008    inflammation, diverticular associated colitis -Dr. Ardis Hughs   Social History:   reports that she has never smoked. She has never used smokeless tobacco. She reports that she does not drink alcohol or use illicit drugs.  Family History  Problem Relation Age of Onset  . Heart disease Mother   . Stroke Father   . Heart disease Brother     Medications: Patient's Medications  New Prescriptions   No medications on file  Previous Medications   ASPIRIN 81 MG TABLET    Take 81 mg by mouth daily.   BUPROPION (WELLBUTRIN XL) 150  MG 24 HR TABLET    Take 450 mg by mouth daily.   CALCIUM CARBONATE-VITAMIN D (CALCIUM 600+D3 PO)    Take by mouth 2 (two) times daily.   CHOLECALCIFEROL (VITAMIN D) 1000 UNITS TABLET    Take 1,000 Units by mouth daily.   CLONAZEPAM (KLONOPIN) 0.5 MG TABLET    Take 0.5 mg by mouth. Take half tablet 2-3 times a week for anxiety as needed   CONJUGATED ESTROGENS (PREMARIN) VAGINAL CREAM    Place 1 g vaginally See admin instructions. Pt uses twice weekly. No certain days   FAMOTIDINE (PEPCID) 20 MG TABLET    Take 20 mg by mouth daily.   GLUCOSAMINE HCL 1000 MG TABS    Take 2,000 mg by  mouth 2 (two) times daily.   HYDROCODONE-ACETAMINOPHEN (NORCO/VICODIN) 5-325 MG PER TABLET    Take 1 tablet by mouth every 6 (six) hours as needed.   IBANDRONATE (BONIVA) 150 MG TABLET    Take 150 mg by mouth every 30 (thirty) days. Take in the morning with a full glass of water, on an empty stomach, and do not take anything else by mouth or lie down for the next 30 min.   MELOXICAM (MOBIC) 7.5 MG TABLET    Take one tablet daily   MULTIPLE VITAMIN (MULTIVITAMIN WITH MINERALS) TABS    Take 1 tablet by mouth daily.   NIFEDIPINE (PROCARDIA-XL/ADALAT CC) 60 MG 24 HR TABLET    Take 1 tablet (60 mg total) by mouth daily.   POLYETHYL GLYCOL-PROPYL GLYCOL (SYSTANE) 0.4-0.3 % SOLN    Apply 1 drop to eye. To both eyes twice daily   PRAVASTATIN (PRAVACHOL) 20 MG TABLET    Take one tablet daily for cholesterol   SERTRALINE (ZOLOFT) 100 MG TABLET    Take 1 1/2  tablet each morning for nerves  Modified Medications   No medications on file  Discontinued Medications   No medications on file     Physical Exam:  Filed Vitals:   09/04/13 1259  BP: 124/72  Pulse: 72  Temp: 97.1 F (36.2 C)  TempSrc: Oral  Resp: 10  Weight: 153 lb (69.4 kg)    Physical Exam  Constitutional: She is oriented to person, place, and time and well-developed, well-nourished, and in no distress.  HENT:  Head: Normocephalic and atraumatic.  Eyes: Conjunctivae and EOM are normal. Pupils are equal, round, and reactive to light.  Neck: Normal range of motion. Neck supple. No thyromegaly present.  Cardiovascular: Normal rate, regular rhythm and normal heart sounds.   Pulmonary/Chest: Effort normal and breath sounds normal. No respiratory distress.  Abdominal: Soft. Bowel sounds are normal. She exhibits no distension and no mass. There is no tenderness. There is no rebound and no guarding.  Musculoskeletal: She exhibits no edema and no tenderness.  Lymphadenopathy:    She has no cervical adenopathy.  Neurological: She is alert  and oriented to person, place, and time.  Skin: Skin is warm and dry. She is not diaphoretic.  Psychiatric: Affect normal.     Labs reviewed: Basic Metabolic Panel:  Recent Labs  08/12/13  NA 141  K 4.4  BUN 20  CREATININE 0.9   Liver Function Tests:  Recent Labs  08/12/13  AST 21  ALT 15  ALKPHOS 49   No results found for this basename: LIPASE, AMYLASE,  in the last 8760 hours No results found for this basename: AMMONIA,  in the last 8760 hours CBC:  Recent Labs  08/28/13 0945  WBC 7.9  NEUTROABS 5.6  HGB 13.3  HCT 39.0  MCV 91  PLT 309   Lipid Panel:  Recent Labs  08/12/13  CHOL 183  HDL 63  LDLCALC 100  TRIG 99    Assessment/Plan 1. Diverticulosis of colon without hemorrhage -abdominal pain has resolved -CT scan done last week without diverticulitis -florastor helped symptoms, can cont to take 1 tablet daily or twice daily during flares -cont diet modifications -follow up as needed

## 2013-09-04 NOTE — Patient Instructions (Signed)
May continue florastor daily or use twice daily with flare ups  Diverticulosis Diverticulosis is a common condition that develops when small pouches (diverticula) form in the wall of the colon. The risk of diverticulosis increases with age. It happens more often in people who eat a low-fiber diet. Most individuals with diverticulosis have no symptoms. Those individuals with symptoms usually experience abdominal pain, constipation, or loose stools (diarrhea). HOME CARE INSTRUCTIONS   Increase the amount of fiber in your diet as directed by your caregiver or dietician. This may reduce symptoms of diverticulosis.  Your caregiver may recommend taking a dietary fiber supplement.  Drink at least 6 to 8 glasses of water each day to prevent constipation.  Try not to strain when you have a bowel movement.  Your caregiver may recommend avoiding nuts and seeds to prevent complications, although this is still an uncertain benefit.  Only take over-the-counter or prescription medicines for pain, discomfort, or fever as directed by your caregiver. FOODS WITH HIGH FIBER CONTENT INCLUDE:  Fruits. Apple, peach, pear, tangerine, raisins, prunes.  Vegetables. Brussels sprouts, asparagus, broccoli, cabbage, carrot, cauliflower, romaine lettuce, spinach, summer squash, tomato, winter squash, zucchini.  Starchy Vegetables. Baked beans, kidney beans, lima beans, split peas, lentils, potatoes (with skin).  Grains. Whole wheat bread, brown rice, bran flake cereal, plain oatmeal, white rice, shredded wheat, bran muffins. SEEK IMMEDIATE MEDICAL CARE IF:   You develop increasing pain or severe bloating.  You have an oral temperature above 102 F (38.9 C), not controlled by medicine.  You develop vomiting or bowel movements that are bloody or black. Document Released: 01/28/2004 Document Revised: 07/25/2011 Document Reviewed: 09/30/2009 [facility] Patient Information 2014 [place].

## 2013-09-12 ENCOUNTER — Encounter: Payer: Self-pay | Admitting: *Deleted

## 2013-10-31 DIAGNOSIS — Z1231 Encounter for screening mammogram for malignant neoplasm of breast: Secondary | ICD-10-CM | POA: Diagnosis not present

## 2013-11-11 ENCOUNTER — Other Ambulatory Visit: Payer: Self-pay | Admitting: *Deleted

## 2013-11-11 MED ORDER — NIFEDIPINE ER 60 MG PO TB24: 60.0000 mg | ORAL_TABLET | Freq: Every day | ORAL | Status: DC

## 2013-11-11 NOTE — Telephone Encounter (Signed)
Patient called and requested mail order Rx. Wants Debbie to take to [facility] to pick up. Given to [place]

## 2013-11-21 ENCOUNTER — Encounter: Payer: Self-pay | Admitting: Internal Medicine

## 2013-12-03 DIAGNOSIS — H04129 Dry eye syndrome of unspecified lacrimal gland: Secondary | ICD-10-CM | POA: Diagnosis not present

## 2013-12-03 DIAGNOSIS — Z961 Presence of intraocular lens: Secondary | ICD-10-CM | POA: Diagnosis not present

## 2013-12-03 DIAGNOSIS — H40059 Ocular hypertension, unspecified eye: Secondary | ICD-10-CM | POA: Diagnosis not present

## 2013-12-18 DIAGNOSIS — F333 Major depressive disorder, recurrent, severe with psychotic symptoms: Secondary | ICD-10-CM | POA: Diagnosis not present

## 2014-01-15 DIAGNOSIS — F333 Major depressive disorder, recurrent, severe with psychotic symptoms: Secondary | ICD-10-CM | POA: Diagnosis not present

## 2014-01-16 DIAGNOSIS — F333 Major depressive disorder, recurrent, severe with psychotic symptoms: Secondary | ICD-10-CM | POA: Diagnosis not present

## 2014-01-24 ENCOUNTER — Encounter (HOSPITAL_COMMUNITY): Payer: Self-pay

## 2014-01-24 ENCOUNTER — Inpatient Hospital Stay (HOSPITAL_COMMUNITY)
Admission: AD | Admit: 2014-01-24 | Discharge: 2014-01-25 | DRG: 395 | Disposition: A | Discharge status: Home / Self Care | Payer: Medicare Other | Source: Ambulatory Visit | Attending: Family Medicine | Admitting: Family Medicine

## 2014-01-24 ENCOUNTER — Ambulatory Visit (INDEPENDENT_AMBULATORY_CARE_PROVIDER_SITE_OTHER): Payer: Medicare Other | Admitting: Family Medicine

## 2014-01-24 ENCOUNTER — Ambulatory Visit (INDEPENDENT_AMBULATORY_CARE_PROVIDER_SITE_OTHER): Payer: Medicare Other

## 2014-01-24 ENCOUNTER — Telehealth: Payer: Self-pay | Admitting: *Deleted

## 2014-01-24 ENCOUNTER — Encounter (HOSPITAL_COMMUNITY)
Admission: AD | Disposition: A | Discharge status: Home / Self Care | Payer: Self-pay | Source: Ambulatory Visit | Attending: Family Medicine

## 2014-01-24 VITALS — BP 178/92 | HR 93 | Temp 98.1°F | Resp 16 | Ht 62.0 in | Wt 146.0 lb

## 2014-01-24 DIAGNOSIS — M810 Age-related osteoporosis without current pathological fracture: Secondary | ICD-10-CM | POA: Diagnosis present

## 2014-01-24 DIAGNOSIS — K219 Gastro-esophageal reflux disease without esophagitis: Secondary | ICD-10-CM

## 2014-01-24 DIAGNOSIS — R1115 Cyclical vomiting syndrome unrelated to migraine: Secondary | ICD-10-CM

## 2014-01-24 DIAGNOSIS — R1110 Vomiting, unspecified: Secondary | ICD-10-CM | POA: Diagnosis present

## 2014-01-24 DIAGNOSIS — F3289 Other specified depressive episodes: Secondary | ICD-10-CM | POA: Diagnosis present

## 2014-01-24 DIAGNOSIS — W44F3XA Food entering into or through a natural orifice, initial encounter: Secondary | ICD-10-CM

## 2014-01-24 DIAGNOSIS — F411 Generalized anxiety disorder: Secondary | ICD-10-CM | POA: Diagnosis present

## 2014-01-24 DIAGNOSIS — I129 Hypertensive chronic kidney disease with stage 1 through stage 4 chronic kidney disease, or unspecified chronic kidney disease: Secondary | ICD-10-CM | POA: Diagnosis present

## 2014-01-24 DIAGNOSIS — N183 Chronic kidney disease, stage 3 unspecified: Secondary | ICD-10-CM | POA: Diagnosis present

## 2014-01-24 DIAGNOSIS — Z7982 Long term (current) use of aspirin: Secondary | ICD-10-CM | POA: Diagnosis not present

## 2014-01-24 DIAGNOSIS — IMO0002 Reserved for concepts with insufficient information to code with codable children: Secondary | ICD-10-CM | POA: Diagnosis present

## 2014-01-24 DIAGNOSIS — Z79899 Other long term (current) drug therapy: Secondary | ICD-10-CM | POA: Diagnosis not present

## 2014-01-24 DIAGNOSIS — T18108A Unspecified foreign body in esophagus causing other injury, initial encounter: Secondary | ICD-10-CM | POA: Diagnosis not present

## 2014-01-24 DIAGNOSIS — E785 Hyperlipidemia, unspecified: Secondary | ICD-10-CM | POA: Diagnosis present

## 2014-01-24 DIAGNOSIS — K222 Esophageal obstruction: Secondary | ICD-10-CM | POA: Diagnosis present

## 2014-01-24 DIAGNOSIS — K449 Diaphragmatic hernia without obstruction or gangrene: Secondary | ICD-10-CM | POA: Diagnosis present

## 2014-01-24 DIAGNOSIS — I10 Essential (primary) hypertension: Secondary | ICD-10-CM | POA: Diagnosis not present

## 2014-01-24 DIAGNOSIS — R1310 Dysphagia, unspecified: Secondary | ICD-10-CM

## 2014-01-24 DIAGNOSIS — F329 Major depressive disorder, single episode, unspecified: Secondary | ICD-10-CM | POA: Diagnosis present

## 2014-01-24 DIAGNOSIS — T18128A Food in esophagus causing other injury, initial encounter: Secondary | ICD-10-CM

## 2014-01-24 DIAGNOSIS — K208 Other esophagitis without bleeding: Secondary | ICD-10-CM | POA: Diagnosis not present

## 2014-01-24 HISTORY — PX: ESOPHAGOGASTRODUODENOSCOPY: SHX5428

## 2014-01-24 HISTORY — PX: FOREIGN BODY REMOVAL: SHX962

## 2014-01-24 LAB — CBC
HEMATOCRIT: 38 % (ref 36.0–46.0)
HEMOGLOBIN: 12.5 g/dL (ref 12.0–15.0)
MCH: 30.3 pg (ref 26.0–34.0)
MCHC: 32.9 g/dL (ref 30.0–36.0)
MCV: 92.2 fL (ref 78.0–100.0)
PLATELETS: 244 10*3/uL (ref 150–400)
RBC: 4.12 MIL/uL (ref 3.87–5.11)
RDW: 12.8 % (ref 11.5–15.5)
WBC: 6.9 10*3/uL (ref 4.0–10.5)

## 2014-01-24 LAB — COMPREHENSIVE METABOLIC PANEL
ALT: 16 U/L (ref 0–35)
AST: 29 U/L (ref 0–37)
Albumin: 3.5 g/dL (ref 3.5–5.2)
Alkaline Phosphatase: 61 U/L (ref 39–117)
Anion gap: 11 (ref 5–15)
BUN: 21 mg/dL (ref 6–23)
CO2: 25 meq/L (ref 19–32)
CREATININE: 0.88 mg/dL (ref 0.50–1.10)
Calcium: 9 mg/dL (ref 8.4–10.5)
Chloride: 105 mEq/L (ref 96–112)
GFR, EST AFRICAN AMERICAN: 67 mL/min — AB (ref >90)
GFR, EST NON AFRICAN AMERICAN: 58 mL/min — AB (ref >90)
GLUCOSE: 111 mg/dL — AB (ref 70–99)
Potassium: 3.6 mEq/L — ABNORMAL LOW (ref 3.7–5.3)
Sodium: 141 mEq/L (ref 137–147)
Total Bilirubin: 0.4 mg/dL (ref 0.3–1.2)
Total Protein: 6.7 g/dL (ref 6.0–8.3)

## 2014-01-24 SURGERY — EGD (ESOPHAGOGASTRODUODENOSCOPY)
Anesthesia: Moderate Sedation

## 2014-01-24 MED ORDER — POLYVINYL ALCOHOL 1.4 % OP SOLN
1.0000 [drp] | OPHTHALMIC | Status: DC | PRN
  Filled 2014-01-24: qty 15

## 2014-01-24 MED ORDER — SODIUM CHLORIDE 0.9 % IV SOLN
INTRAVENOUS | Status: DC
  Administered 2014-01-24: 18:00:00 via INTRAVENOUS

## 2014-01-24 MED ORDER — SERTRALINE HCL 50 MG PO TABS: 50.0000 mg | ORAL_TABLET | Freq: Every day | ORAL | Status: DC

## 2014-01-24 MED ORDER — ONDANSETRON HCL 4 MG/2ML IJ SOLN: 4.0000 mg | Freq: Four times a day (QID) | INTRAMUSCULAR | Status: DC | PRN

## 2014-01-24 MED ORDER — CLONAZEPAM 0.5 MG PO TABS: 0.2500 mg | ORAL_TABLET | Freq: Two times a day (BID) | ORAL | Status: DC | PRN

## 2014-01-24 MED ORDER — HEPARIN SODIUM (PORCINE) 5000 UNIT/ML IJ SOLN: 5000.0000 [IU] | Freq: Three times a day (TID) | INTRAMUSCULAR | Status: DC

## 2014-01-24 MED ORDER — NIFEDIPINE ER 60 MG PO TB24
60.0000 mg | ORAL_TABLET | Freq: Every day | ORAL | Status: DC
  Administered 2014-01-25: 60 mg via ORAL
  Filled 2014-01-24: qty 1

## 2014-01-24 MED ORDER — SODIUM CHLORIDE 0.9 % IV SOLN: INTRAVENOUS | Status: AC

## 2014-01-24 MED ORDER — MIDAZOLAM HCL 5 MG/ML IJ SOLN
INTRAMUSCULAR | Status: AC
  Filled 2014-01-24: qty 1

## 2014-01-24 MED ORDER — LORAZEPAM 2 MG/ML IJ SOLN: 0.5000 mg | Freq: Four times a day (QID) | INTRAMUSCULAR | Status: DC | PRN

## 2014-01-24 MED ORDER — PANTOPRAZOLE SODIUM 40 MG IV SOLR
40.0000 mg | INTRAVENOUS | Status: DC
  Filled 2014-01-24 (×2): qty 40

## 2014-01-24 MED ORDER — FENTANYL CITRATE 0.05 MG/ML IJ SOLN
INTRAMUSCULAR | Status: AC
  Filled 2014-01-24: qty 2

## 2014-01-24 MED ORDER — BUPROPION HCL ER (XL) 300 MG PO TB24
450.0000 mg | ORAL_TABLET | Freq: Every day | ORAL | Status: DC
  Administered 2014-01-25: 450 mg via ORAL
  Filled 2014-01-24: qty 1

## 2014-01-24 MED ORDER — HYDRALAZINE HCL 20 MG/ML IJ SOLN: 5.0000 mg | Freq: Four times a day (QID) | INTRAMUSCULAR | Status: DC | PRN

## 2014-01-24 MED ORDER — MIDAZOLAM HCL 10 MG/2ML IJ SOLN
INTRAMUSCULAR | Status: DC | PRN
  Administered 2014-01-24: 2 mg via INTRAVENOUS

## 2014-01-24 MED ORDER — SIMVASTATIN 20 MG PO TABS
20.0000 mg | ORAL_TABLET | Freq: Every day | ORAL | Status: DC
  Filled 2014-01-24: qty 1

## 2014-01-24 MED ORDER — ASPIRIN 81 MG PO CHEW
81.0000 mg | CHEWABLE_TABLET | Freq: Every day | ORAL | Status: DC
  Administered 2014-01-25 (×2): 81 mg via ORAL
  Filled 2014-01-24 (×3): qty 1

## 2014-01-24 MED ORDER — ACETAMINOPHEN 650 MG RE SUPP: 650.0000 mg | Freq: Four times a day (QID) | RECTAL | Status: DC | PRN

## 2014-01-24 MED ORDER — FENTANYL CITRATE 0.05 MG/ML IJ SOLN
INTRAMUSCULAR | Status: DC | PRN
  Administered 2014-01-24: 25 ug via INTRAVENOUS

## 2014-01-24 MED ORDER — SERTRALINE HCL 50 MG PO TABS
150.0000 mg | ORAL_TABLET | Freq: Every day | ORAL | Status: DC
  Administered 2014-01-25: 150 mg via ORAL
  Filled 2014-01-24: qty 1

## 2014-01-24 MED ORDER — POLYETHYL GLYCOL-PROPYL GLYCOL 0.4-0.3 % OP SOLN: 1.0000 [drp] | Freq: Every day | OPHTHALMIC | Status: DC

## 2014-01-24 NOTE — Progress Notes (Signed)
80 yo woman from [facility], a patient of Art Nyoka Cowden, with h/o GERD and over the past month she has had intermittent epigastric pain and over the last 16 hours she's had vomiting every time she eats or drinks anything.  The vomiting happens very quickly after eating or drinking anything.   She spoke with Dr. Rolly Salter office who referred her here because of concern over possible dehydration.  Objective:  Alert, articulate Chest: clear Heart:  Regular with I/VI systolic murmur Abdomen:  Soft, nontender without mass or HSM  [facility] reading (PRIMARY) by  Dr. CXR: normal with exception of what appears to be a hiatal hernia'  Assessment:  Esophageal stricture with intractable vomiting. Not significantly dehydrated at this point  Plan: urgent referral to GI  Robyn Haber, MD

## 2014-01-24 NOTE — Op Note (Signed)
[facility] [place], [address]   ENDOSCOPY PROCEDURE REPORT  PATIENT: Tina, Hampton  MR#: [id number] BIRTHDATE: Apr 06, 1926 , 59  yrs. old GENDER: Female ENDOSCOPIST: Eustace Quail, MD REFERRED BY:  Jeanmarie Hubert, M.D. PROCEDURE DATE:  01/24/2014 PROCEDURE:  EGD w/ foreign body removal ASA CLASS:     Class II INDICATIONS:  Dysphagia.  food impaction MEDICATIONS: Fentanyl 25 mcg IV and Versed 3 mg IV TOPICAL ANESTHETIC: Cetacaine Spray  DESCRIPTION OF PROCEDURE: After the risks benefits and alternatives of the procedure were thoroughly explained, informed consent was obtained.  The Pentax Gastroscope [id number] endoscope was introduced through the mouth and advanced to the second portion of the duodenum. Without limitations.  The instrument was slowly withdrawn as the mucosa was fully examined.    EXAM:The esophagus was fluid filled with food debris.  The most distal portion revealed impacted meat which was gently passed into the stomach with the endoscope.  An obvious dense peptic stricture measuring approximately 14-15 mm was present.  There was some edema and friability of the mucosa.  The stomach was J-shaped with a hiatal hernia.  There was nonspecific antral gastritis and slight pyloric stenosis.  The duodenal bulb and post bulbar duodenum were normal.  Retroflexed views revealed a hiatal hernia.     The scope was then withdrawn from the patient and the procedure completed.  COMPLICATIONS: There were no complications. ENDOSCOPIC IMPRESSION: 1. Acute esophageal meat impaction status post endoscopic removal 2. GERD with peptic stricture 3. Other findings as described  RECOMMENDATIONS: 1. Crit liquid still I am then liquids and soft foods only until esophageal dilation (see below) 2. Begin pantoprazole 40 mg daily 3. Outpatient esophageal dilation with Dr. Ardis Hughs in a few weeks. His nurse will contact you regarding  scheduling  REPEAT EXAM:  eSigned:  Eustace Quail, MD 01/24/2014 6:17 PM   NO:[id number] Nyoka Cowden, MD and Owens Loffler, MD

## 2014-01-24 NOTE — Consult Note (Signed)
Referring Provider: Family Medicine Teaching Service Primary Care Physician:  Estill Dooms, MD Primary Gastroenterologist:  Dr.Jacobs  Reason for Consultation:  Dysphagia, food impaction    HPI: Tina Hampton is a 80 y.o. female  admitted today due to trouble swallowing.  PMH is significant for GERD, hiatal hernia, HLD, depression, anxiety, CKD stage II, and osteoporosis.  She has a hx of GERD, and over the past few weeks she has  had progressive dysphagia where she feels solids going down slow and sticking in the lower esophagus. She says 2 weeks ago she ate a bite of pork and it became stuck but passed with time. Last night she was eating prime rib and carrots and a piece became stuck. She has not been able to swallow anything since. She has no nausea, but everything she eats or drinks comes up.Pt was seen in urgent care today and advised to come to hospital for admission.She takes a baby ASA qd and pepcid.She is also on mobic daily.She is not on any other blood thinning agents.  Colonoscopy 08/01/2006: Inflammation,diverticular associated colitis-Dr.Jacobs    No endoscopies in epic.    Past Medical History  Diagnosis Date  . High cholesterol   . Hypertension   . GERD (gastroesophageal reflux disease)   . Hiatal hernia   . Scoliosis   . DDD (degenerative disc disease)   . Depression   . Pain in joint, pelvic region and thigh 11/12/2011  . Insomnia, unspecified 10/18/2011  . Abnormal mammogram, unspecified 04/19/2011  . Chronic rhinitis 09/28/2010  . Palpitations 09/29/2009  . Chronic kidney disease, stage II (mild) 04/03/2009  . Nocturia 09/19/2008  . Other abnormal blood chemistry 04/03/2007  . Pain in joint, shoulder region 08/15/2006  . Tension headache 10/04/2005  . Pain in limb 08/26/2005  . Diverticulosis of colon (without mention of hemorrhage) 06/17/2005  . Dermatophytosis of nail 01/21/2005  . Unspecified constipation 01/21/2005  . Backache, unspecified 01/21/2005  .  Postmenopausal atrophic vaginitis 12/14/2004  . Senile osteoporosis 12/14/2004  . Unspecified cataract 11/26/2004  . Edema 11/26/2004  . Unspecified urinary incontinence 11/26/2004  . Allergy   . Anxiety     Past Surgical History  Procedure Laterality Date  . Tonsillectomy      Dr.Joe Tamala Julian  . Breast surgery      benign tumor removal, left breast 1969  . Cholecystectomy  1990    Dr.Moore   . Vaginal prolapse repair  2002    Dr.Horback   . Bilateral eyelid surgery  2005    Dr.Scott  . Cataract extraction      Right  . Hip pinning,cannulated  11/07/2011    Procedure: CANNULATED HIP PINNING;  Surgeon: Jessy Oto, MD;  Location: [facility];  Service: Orthopedics;  Laterality: Left;  . Abdominal hysterectomy  1969    Dr.Braun   . Bilateral oophorectomy  2002  . Orif femoral neck fracture w/ dhs  11/07/2011    Dr.Nitka   . Colonoscopy  3/18/20008    inflammation, diverticular associated colitis -Dr. Ardis Hughs  . Cosmetic surgery    . Eye surgery    . Tubal ligation      Prior to Admission medications   Medication Sig Start Date End Date Taking? Authorizing Provider  aspirin 81 MG tablet Take 81 mg by mouth daily.    Historical Provider, MD  buPROPion (WELLBUTRIN XL) 150 MG 24 hr tablet Take 450 mg by mouth daily.    Historical Provider, MD  Calcium Carbonate-Vitamin D (  CALCIUM 600+D3 PO) Take by mouth 2 (two) times daily.    Historical Provider, MD  cholecalciferol (VITAMIN D) 1000 UNITS tablet Take 1,000 Units by mouth daily.    Historical Provider, MD  clonazePAM (KLONOPIN) 0.5 MG tablet Take 0.5 mg by mouth. Take half tablet 2-3 times a week for anxiety as needed 11/10/11 01/24/14  Nita Sells, MD  conjugated estrogens (PREMARIN) vaginal cream Place 1 g vaginally See admin instructions. Pt uses twice weekly. No certain days    Historical Provider, MD  famotidine (PEPCID) 20 MG tablet Take 20 mg by mouth daily.    Historical Provider, MD  Glucosamine HCl 1000 MG TABS Take 2,000 mg  by mouth 2 (two) times daily.    Historical Provider, MD  ibandronate (BONIVA) 150 MG tablet Take 150 mg by mouth every 30 (thirty) days. Take in the morning with a full glass of water, on an empty stomach, and do not take anything else by mouth or lie down for the next 30 min.    Historical Provider, MD  meloxicam (MOBIC) 7.5 MG tablet Take one tablet daily 07/19/13   Historical Provider, MD  Multiple Vitamin (MULTIVITAMIN WITH MINERALS) TABS Take 1 tablet by mouth daily.    Historical Provider, MD  NIFEdipine (PROCARDIA-XL/ADALAT CC) 60 MG 24 hr tablet Take 1 tablet (60 mg total) by mouth daily. 11/11/13   Tiffany L Reed, DO  Polyethyl Glycol-Propyl Glycol (SYSTANE) 0.4-0.3 % SOLN Apply 1 drop to eye. To both eyes twice daily    Historical Provider, MD  pravastatin (PRAVACHOL) 20 MG tablet Take one tablet daily for cholesterol 05/29/13   Pricilla Larsson, NP  sertraline (ZOLOFT) 100 MG tablet Take 1 1/2  tablet each morning for nerves 10/06/12   Historical Provider, MD    Current Facility-Administered Medications  Medication Dose Route Frequency Provider Last Rate Last Dose  . 0.9 %  sodium chloride infusion   Intravenous Continuous Coral Spikes, DO      . acetaminophen (TYLENOL) suppository 650 mg  650 mg Rectal Q6H PRN Coral Spikes, DO      . hydrALAZINE (APRESOLINE) injection 5 mg  5 mg Intravenous Q6H PRN Coral Spikes, DO      . LORazepam (ATIVAN) injection 0.5 mg  0.5 mg Intravenous Q6H PRN Coral Spikes, DO      . ondansetron (ZOFRAN) injection 4 mg  4 mg Intravenous Q6H PRN Coral Spikes, DO      . pantoprazole (PROTONIX) injection 40 mg  40 mg Intravenous Q24H Jayce G Cook, DO      . polyvinyl alcohol (LIQUIFILM TEARS) 1.4 % ophthalmic solution 1 drop  1 drop Both Eyes PRN Coral Spikes, DO        Allergies as of 01/24/2014 - Review Complete 01/24/2014  Allergen Reaction Noted  . Penicillins Other (See Comments) 11/07/2011    Family History  Problem Relation Age of Onset  . Heart disease  Mother   . Stroke Father   . Heart disease Brother     History   Social History  . Marital Status: Widowed    Spouse Name: N/A    Number of Children: N/A  . Years of Education: N/A   Occupational History  . retired Producer, television/film/video    Social History Main Topics  . Smoking status: Never Smoker   . Smokeless tobacco: Never Used  . Alcohol Use: No  . Drug Use: No  . Sexual Activity: No   Other  Topics Concern  . Not on file   Social History Narrative   Lives at [facility] since 04/14/2005   Widowed (husband expired 2014)   Has Living Will   Exercise: water aerobic  5 times a week   Walks with walker   Never smoked   Drinks moderate amount of caffeinate beverages daily   Alcohol none          Review of Systems: Gen: Denies any fever, chills, sweats, anorexia, fatigue, weakness, malaise, weight loss, and sleep disorder CV: Denies chest pain, angina, palpitations, syncope, orthopnea, PND, peripheral edema, and claudication. Resp: Denies dyspnea at rest, dyspnea with exercise, cough, sputum, wheezing, coughing up blood, and pleurisy. GI: Denies vomiting blood, jaundice, and fecal incontinence.   Admitd to dysphagia.. GU : Denies urinary burning, blood in urine, urinary frequency, urinary hesitancy, nocturnal urination, and urinary incontinence. MS: Denies joint pain, limitation of movement, and swelling, stiffness, low back pain, extremity pain. Denies muscle weakness, cramps, atrophy.  Derm: Denies rash, itching, dry skin, hives, moles, warts, or unhealing ulcers.  Psych: Denies depression, anxiety, memory loss, suicidal ideation, hallucinations, paranoia, and confusion. Heme: Denies bruising, bleeding, and enlarged lymph nodes. Neuro:  Denies any headaches, dizziness, paresthesias. Endo:  Denies any problems with DM, thyroid, adrenal function.  Physical Exam: Vital signs in last 24 hours: Temp:  [98 F (36.7 C)-98.1 F (36.7 C)] 98 F (36.7 C) (09/11 1530) Pulse  Rate:  [77-93] 77 (09/11 1530) Resp:  [16] 16 (09/11 1530) BP: (167-178)/(81-92) 167/81 mmHg (09/11 1530) SpO2:  [94 %] 94 % (09/11 1530) Weight:  [146 lb (66.225 kg)] 146 lb (66.225 kg) (09/11 1228)   General:   Alert,  Well-developed, well-nourished, pleasant and cooperative in NAD Head:  Normocephalic and atraumatic. Eyes:  Sclera clear, no icterus.   Conjunctiva pink. Ears:  Normal auditory acuity. Nose:  No deformity, discharge,  or lesions. Mouth:  No deformity or lesions.   Neck:  Supple; no masses or thyromegaly. Lungs:  Clear throughout to auscultation.   Heart:  Regular rate and rhythm; no murmurs, clicks, rubs,  or gallops. Abdomen:  Soft,nontender, BS active,nonpalp mass or hsm.   Rectal:  Deferred  Msk:  Symmetrical without gross deformities. . Pulses:  Normal pulses noted. Extremities:  Without clubbing or edema. Neurologic:  Alert and  oriented x4;  grossly normal neurologically. Skin:  Intact without significant lesions or rashes.. Psych:  Alert and cooperative. Normal mood and affect     Studies/Results: Dg Chest 2 View  01/24/2014   CLINICAL DATA:  Intractable nausea and vomiting.  EXAM: CHEST  2 VIEW  COMPARISON:  11/07/2011.  FINDINGS: Opacity projects posterior to the heart, which is most likely due to a moderate-sized hiatal hernia. This is best seen on the lateral view. Heart is normal in size. Aorta is tortuous. No hilar masses or evidence of adenopathy.  Lungs are hyperexpanded but clear. No pleural effusion. No pneumothorax.  Bony thorax is demineralized but grossly intact.  IMPRESSION: 1. Opacity projects posterior to the heart, most likely a moderate size hiatal hernia. 2. No convincing acute cardiopulmonary disease.   Electronically Signed   By: Lajean Manes M.D.   On: 01/24/2014 13:42    IMPRESSION/PLAN:80 yo female with 2 weeks progressive dysphagia now with food impaction. Pt to undergo EGD with removal of FB later today. Dr. Henrene Pastor  aware.  Discontinued subcu heparin to allow for procedure. Had not yet received this med.     Hvozdovic, Cecille Rubin  P PA-C 01/24/2014,   GI ATTENDING  History and data reviewed. Patient personally seen and examined. Appears to have food impaction with background history of progressive dysphagia. Plan urgent upper endoscopy this evening.The nature of the procedure, as well as the risks, benefits, and alternatives were carefully and thoroughly reviewed with the patient. Ample time for discussion and questions allowed. The patient understood, was satisfied, and agreed to proceed.  Docia Chuck. Geri Seminole., M.D. [facility] Division of Gastroenterology

## 2014-01-24 NOTE — H&P (Signed)
FMTS Attending Admission Note: Tina Sabal MD Personal pager:  [phone] FPTS Service Pager:  [phone]  I  have seen and examined this patient, reviewed their chart. I have discussed this patient with the resident. I agree with the resident's findings, assessment and care plan.  Additionally:  80 yo F with known GERD, hiatal hernia, depression, CKD Stage II who presented to Urgent Care today for nausea and vomiting with food and liquids x 1 day.  Concern for food impaction as happened after large steak dinner. Admitted directly to [place].  GI already consulted and plans to take her back for endoscopy.  On exam, she is sitting in bed, awake and alert, fully dressed.  Pleasant and converstant.  Heart RRR.  Lungs clear.  Abdomen completely benign.  Ext without edema.   Imp/Plan: 1.  Nausea/vomiting: - along with dysphagia.  - agree with endoscoyp - agree with IVF, anti-emetics.   - would like to see her tolerating PO before sending her home.  Will watch overnight.  - Of note, has NOT had recent dose of ibandronate.   2.  HTN: - repeat BP in AM.     Alveda Reasons, MD 01/24/2014 5:36 PM

## 2014-01-24 NOTE — Discharge Summary (Signed)
[facility] Discharge Summary  Patient name: Tina Hampton Medical record number: [id number] Date of birth: Jul 22, 1925 Age: 80 y.o. Gender: female Date of Admission: 01/24/2014  Date of Discharge: 01/25/14 Admitting Physician: Alveda Reasons, MD  Primary Care Provider: Estill Dooms, MD Consultants: GI, Dr. Henrene Pastor  Indication for Hospitalization: Intractable Vomiting, Dyshagia  Discharge Diagnoses/Problem List:  Intractable vomiting & Dysphagia secondary to food impaction  Esophageal stricture HTN Depression Anxiety Osteoporosis  Disposition: Home.  Discharge Condition: Stable.  Brief Hospital Course:  Tina Hampton is a 80 y.o. female presenting with intractable vomiting (patient unable to keep down solids or liquids with associated epigastric discomfort). PMH is significant for GERD, hiatal hernia, HLD, depression, anxiety, CKD stage II, and osteoporosis.   Intractable vomiting and dysphagia secondary to food impaction and esophageal stricture - Patient admitted on 9/11 with dysphagia and vomiting, which began the night prior after eating a meal - Patient was made NPO and GI was consulted with concern for esophageal stricture and possible food impaction. - EGD was done shortly after arrival/admission. - EGD revealed an impacted piece of meat at the distal portion of the esophagus.  This was gently passed into the stomach.  EGD also revealed an esophageal stricture. - Following EGD patient was able to tolerate PO and diet was advanced. - Patient to follow up with Dr. Ardis Hughs (GI)   HTN - Home meds restarted after patient was able to take PO - BP stable on admission  Depression/Anxiety - Continue home meds after patient was able to take PO  Osteoporosis - Home medications held during admission  Issues for Follow Up:  1) Patient needs close follow up with GI; dilation needed 2) Reassess GERD treatment/symptom control  Significant  Procedures: EGD w/ foreign body removal  Significant Labs and Imaging:   Recent Labs Lab 01/24/14 2100 01/25/14 0714  WBC 6.9 8.0  HGB 12.5 12.4  HCT 38.0 38.9  PLT 244 229    Recent Labs Lab 01/24/14 2100 01/25/14 0714  NA 141 142  K 3.6* 3.9  CL 105 106  CO2 25 24  GLUCOSE 111* 116*  BUN 21 19  CREATININE 0.88 0.82  CALCIUM 9.0 8.9  ALKPHOS 61  --   AST 29  --   ALT 16  --   ALBUMIN 3.5  --    Results/Tests Pending at Time of Discharge: None  Discharge Medications:    Medication List    STOP taking these medications       famotidine 20 MG tablet  Commonly known as:  PEPCID     meloxicam 7.5 MG tablet  Commonly known as:  MOBIC      TAKE these medications       aspirin 81 MG tablet  Take 81 mg by mouth daily.     buPROPion 150 MG 24 hr tablet  Commonly known as:  WELLBUTRIN XL  Take 450 mg by mouth daily.     CALCIUM 600+D3 PO  Take by mouth 2 (two) times daily.     cholecalciferol 1000 UNITS tablet  Commonly known as:  VITAMIN D  Take 1,000 Units by mouth daily.     clonazePAM 0.5 MG tablet  Commonly known as:  KLONOPIN  Take 0.25 mg by mouth daily as needed for anxiety. Take half tablet 2-3 times a week for anxiety as needed for anxiety     conjugated estrogens vaginal cream  Commonly known as:  PREMARIN  Place 1 g vaginally  See admin instructions. Pt uses twice weekly. No certain days     Glucosamine HCl 1000 MG Tabs  Take 2,000 mg by mouth 2 (two) times daily.     ibandronate 150 MG tablet  Commonly known as:  BONIVA  Take 150 mg by mouth every 30 (thirty) days. Take in the morning with a full glass of water, on an empty stomach, and do not take anything else by mouth or lie down for the next 30 min. Last dose was on August 20th 2015     multivitamin with minerals Tabs tablet  Take 1 tablet by mouth daily.     NIFEdipine 60 MG 24 hr tablet  Commonly known as:  PROCARDIA-XL/ADALAT CC  Take 60 mg by mouth daily.     pantoprazole  40 MG tablet  Commonly known as:  PROTONIX  Take 1 tablet (40 mg total) by mouth daily.     pravastatin 20 MG tablet  Commonly known as:  PRAVACHOL  Take 20 mg by mouth daily. Take one tablet daily for cholesterol     PROBIOTIC PO  Take 1 tablet by mouth daily.     sertraline 100 MG tablet  Commonly known as:  ZOLOFT  Take 150 mg by mouth daily. Take 1 1/2  tablet each morning for nerves     SYSTANE 0.4-0.3 % Soln  Generic drug:  Polyethyl Glycol-Propyl Glycol  Place 1 drop into both eyes 2 (two) times daily.        Discharge Instructions: Patient was counseled important signs and symptoms that should prompt return to medical care, changes in medications, dietary instructions, activity restrictions, and follow up appointments.   Follow-Up Appointments: F/U with GI, Dr. Ardis Hughs in ~ 2 weeks.   Coral Spikes, DO 01/26/2014, 4:15 PM PGY-3, [facility]

## 2014-01-24 NOTE — Addendum Note (Signed)
Addended by: Orion Crook on: 01/24/2014 03:14 PM   Modules accepted: Level of Service

## 2014-01-24 NOTE — Telephone Encounter (Signed)
Patient states that she is very concerned about her Hernia. She could not sleep at all last night and has been vomiting all night and morning. Cant keep anything on her stomach. Hurting some right in the middle of chest and knows it is her Hiatal Hernia. Patient suggested going to Urgent Care and I agreed. Patient concerned for dehydration.

## 2014-01-24 NOTE — H&P (Signed)
[facility] Admission History and Physical Service Pager: [phone]  Patient name: Tina Hampton Medical record number: [id number] Date of birth: 1925-12-27 Age: 80 y.o. Gender: female  Primary Care Provider: Estill Dooms, MD Consultants: GI Code Status: Full  Chief Complaint: Vomiting  Assessment and Plan: Tina Hampton is a 80 y.o. female presenting with intractable vomiting (patient unable to keep down solids or liquids with associated epigastric discomfort).  PMH is significant for GERD, hiatal hernia, HLD, depression, anxiety, CKD stage II, and osteoporosis.   Dysphagia, vomiting: ? food impaction vs. esophageal stricture: Patient presents with immediate vomiting of everything she eats or drinks for the past 16 hours concerning for food impaction vs. esophageal stricture. Patient reports acute onset after eating steak at dinner, thus food impaction most likely. However, could be esophageal stricture in the context of GERD. Patient does not have history of radiation that would predispose to stricture other than that caused by long-standing GERD. Abrupt onset, lack of smoking/tobacco history and constitutional sx make malignancy unlikely. Motility disorder such as achalasia less likely as does not match history of progressive dysphagia. -- GI consulted, appreciate recommendations; patient going for upper endoscopy this evening. -- NPO -- NS @ 132mL/hr -- Protonix 40mg  IV qd -- Zofran prn -- F/u CBC, BMP  Hypertension: Hypertensive 167/81 upon admission.  -- Hydralazine prn for SBP >160 -- Holding PO antihypertensive in setting of NPO status/inability to take PO  Depression,anxiety: Holding home wellbutrin and prozac as patient is unable to tolerate po intake. Consulted pharmacy to confirm half-life sufficient to preclude sx from abrupt withdrawal. -- Ativan prn for anxiety, as patient is on home Klonopin -- Will restart home medications when  able.  Osteoporosis: Holding home ibandronate.  FEN/GI: NPO, NS 160mL/hr Prophylaxis: SCD's; Holding Heparin in setting of EGD this evening.   Disposition: Floor  History of Present Illness: Tina Hampton is a 80 y.o. female with a PMH significant for GERD and hiatal hernia who presents with 16 hours of intractable vomiting with intermittent epigastric pain. She reports that last night her symptoms started last night after eating prime rib. She ate a few small pieces of prime rib and some carrots and shortly after started vomiting chunks of carrots. Since, she has been unable to keep down any food or liquids. She vomits almost immediately after trying to ingest food or drink. She presented to Urgent Care who recommended she come to the hospital.    She did experience some associated epigastric pain last night. She has a history of GERD but does not take medications as she is usually asymptomatic. She has only had one other prior episode of vomiting about a month ago with associated epigastric pain that resolved quickly. Otherwise she has not experienced recent dysphagia or epigastric pain.   She denies nausea, fevers, chills, sweats, abdominal pain, diarrhea, constipation, melena, hematochezia, or hemoptysis.   Review Of Systems: Per HPI with the following additions: Denies SOB, CP, lower extremity edema.  Otherwise 12 point review of systems was performed and was unremarkable.  Patient Active Problem List   Diagnosis Date Noted  . Intractable vomiting 01/24/2014  . Dysphagia, unspecified(787.20) 01/24/2014  . Food impaction of esophagus 01/24/2014  . Esophageal obstruction 01/24/2014  . Cervicalgia 08/20/2013  . Hip fracture 11/07/2011  . GERD (gastroesophageal reflux disease)   . Hypertension   . High cholesterol   . Hiatal hernia   . Scoliosis   . DDD (degenerative disc disease)   . Depression   .  Senile osteoporosis 12/14/2004  . Postmenopausal atrophic vaginitis 12/14/2004   . Edema 11/26/2004  . Unspecified urinary incontinence 11/26/2004   Past Medical History: Past Medical History  Diagnosis Date  . High cholesterol   . Hypertension   . GERD (gastroesophageal reflux disease)   . Hiatal hernia   . Scoliosis   . DDD (degenerative disc disease)   . Depression   . Pain in joint, pelvic region and thigh 11/12/2011  . Insomnia, unspecified 10/18/2011  . Abnormal mammogram, unspecified 04/19/2011  . Chronic rhinitis 09/28/2010  . Palpitations 09/29/2009  . Chronic kidney disease, stage II (mild) 04/03/2009  . Nocturia 09/19/2008  . Other abnormal blood chemistry 04/03/2007  . Pain in joint, shoulder region 08/15/2006  . Tension headache 10/04/2005  . Pain in limb 08/26/2005  . Diverticulosis of colon (without mention of hemorrhage) 06/17/2005  . Dermatophytosis of nail 01/21/2005  . Unspecified constipation 01/21/2005  . Backache, unspecified 01/21/2005  . Postmenopausal atrophic vaginitis 12/14/2004  . Senile osteoporosis 12/14/2004  . Unspecified cataract 11/26/2004  . Edema 11/26/2004  . Unspecified urinary incontinence 11/26/2004  . Allergy   . Anxiety    Past Surgical History: Past Surgical History  Procedure Laterality Date  . Tonsillectomy      Dr.Joe Tamala Julian  . Breast surgery      benign tumor removal, left breast 1969  . Cholecystectomy  1990    Dr.Moore   . Vaginal prolapse repair  2002    Dr.Horback   . Bilateral eyelid surgery  2005    Dr.Scott  . Cataract extraction      Right  . Hip pinning,cannulated  11/07/2011    Procedure: CANNULATED HIP PINNING;  Surgeon: Jessy Oto, MD;  Location: [facility];  Service: Orthopedics;  Laterality: Left;  . Abdominal hysterectomy  1969    Dr.Braun   . Bilateral oophorectomy  2002  . Orif femoral neck fracture w/ dhs  11/07/2011    Dr.Nitka   . Colonoscopy  3/18/20008    inflammation, diverticular associated colitis -Dr. Ardis Hughs  . Cosmetic surgery    . Eye surgery    . Tubal ligation     Social  History: History  Substance Use Topics  . Smoking status: Never Smoker   . Smokeless tobacco: Never Used  . Alcohol Use: No   Additional social history: Lives alone at [facility]. Here  Please also refer to relevant sections of EMR.  Family History: Family History  Problem Relation Age of Onset  . Heart disease Mother   . Stroke Father   . Heart disease Brother    Allergies and Medications: Allergies  Allergen Reactions  . Penicillins Other (See Comments)    CHILDHOOD REACTION   No current facility-administered medications on file prior to encounter.   Current Outpatient Prescriptions on File Prior to Encounter  Medication Sig Dispense Refill  . aspirin 81 MG tablet Take 81 mg by mouth daily.      Marland Kitchen buPROPion (WELLBUTRIN XL) 150 MG 24 hr tablet Take 450 mg by mouth daily.      . Calcium Carbonate-Vitamin D (CALCIUM 600+D3 PO) Take by mouth 2 (two) times daily.      . cholecalciferol (VITAMIN D) 1000 UNITS tablet Take 1,000 Units by mouth daily.      . clonazePAM (KLONOPIN) 0.5 MG tablet Take 0.25 mg by mouth daily as needed for anxiety. Take half tablet 2-3 times a week for anxiety as needed for anxiety      .  conjugated estrogens (PREMARIN) vaginal cream Place 1 g vaginally See admin instructions. Pt uses twice weekly. No certain days      . famotidine (PEPCID) 20 MG tablet Take 20 mg by mouth daily.      . Glucosamine HCl 1000 MG TABS Take 2,000 mg by mouth 2 (two) times daily.      Marland Kitchen ibandronate (BONIVA) 150 MG tablet Take 150 mg by mouth every 30 (thirty) days. Take in the morning with a full glass of water, on an empty stomach, and do not take anything else by mouth or lie down for the next 30 min. Last dose was on August 20th 2015      . meloxicam (MOBIC) 7.5 MG tablet Take 7.5 mg by mouth daily.       . Multiple Vitamin (MULTIVITAMIN WITH MINERALS) TABS Take 1 tablet by mouth daily.      Vladimir Faster Glycol-Propyl Glycol (SYSTANE) 0.4-0.3 % SOLN Place 1 drop into  both eyes 2 (two) times daily.       . sertraline (ZOLOFT) 100 MG tablet Take 50 mg by mouth daily. Take 1 1/2  tablet each morning for nerves        Objective: BP 167/81  Pulse 77  Temp(Src) 98 F (36.7 C)  Resp 16  SpO2 94%  Exam: General: Well-appearing woman in NAD. HEENT: Dry mucus membranes. PERRLA. EOMI. Conjunctiva and oral mucosa not pale-appearing.  Neck: Supple, no adenopathy or thyromegaly noted.  Cardiovascular: RRR, no m/r/g. No evidence of JVD.  Respiratory: CTAB, no wheezes or crackles  Abdomen: +bs, nontender, nondistended Extremities: No LE swelling.  Skin: Warm, well-perfused. No lesions noted. Neuro: Alert and oriented x 3. CN II-XII grossly intact. Sensation intact to light touch throughout. Normal gait.   Labs and Imaging: CBC BMET  No results found for this basename: WBC, HGB, HCT, PLT,  in the last 168 hours No results found for this basename: NA, K, CL, CO2, BUN, CREATININE, GLUCOSE, CALCIUM,  in the last 168 hours    [place], [place] Intern pager: [phone], text pages welcome  I have seen and evaluated the above patient.  Addendum in blue. Physical exam: General: Pleasant, well-appearing, no acute distress. HEENT: MM dry. Cardiovascular: RRR. Soft systolic murmur noted.  Respiratory: CTAB, no wheezes or crackles  Abdomen: Soft, nontender, nondistended. No palpable organomegaly. Extremities: No lower extremity edema. Skin: Warm, dry, intact. Neuro: No focal deficits on exam.   Arthur PGY-3

## 2014-01-25 LAB — CBC
HCT: 38.9 % (ref 36.0–46.0)
Hemoglobin: 12.4 g/dL (ref 12.0–15.0)
MCH: 30.5 pg (ref 26.0–34.0)
MCHC: 31.9 g/dL (ref 30.0–36.0)
MCV: 95.8 fL (ref 78.0–100.0)
PLATELETS: 229 10*3/uL (ref 150–400)
RBC: 4.06 MIL/uL (ref 3.87–5.11)
RDW: 12.8 % (ref 11.5–15.5)
WBC: 8 10*3/uL (ref 4.0–10.5)

## 2014-01-25 LAB — BASIC METABOLIC PANEL
ANION GAP: 12 (ref 5–15)
BUN: 19 mg/dL (ref 6–23)
CHLORIDE: 106 meq/L (ref 96–112)
CO2: 24 meq/L (ref 19–32)
Calcium: 8.9 mg/dL (ref 8.4–10.5)
Creatinine, Ser: 0.82 mg/dL (ref 0.50–1.10)
GFR calc non Af Amer: 63 mL/min — ABNORMAL LOW (ref >90)
GFR, EST AFRICAN AMERICAN: 73 mL/min — AB (ref >90)
Glucose, Bld: 116 mg/dL — ABNORMAL HIGH (ref 70–99)
Potassium: 3.9 mEq/L (ref 3.7–5.3)
SODIUM: 142 meq/L (ref 137–147)

## 2014-01-25 MED ORDER — PANTOPRAZOLE SODIUM 40 MG PO TBEC: 40.0000 mg | DELAYED_RELEASE_TABLET | Freq: Every day | ORAL | Status: DC

## 2014-01-25 NOTE — Discharge Summary (Signed)
I agree with the History/assesment & plan per Resident MD as scribed elsewhere within the note and have independently discussed the Plan of care with the patient, and ammendments were made to above note reflecting my thoughts after careful review of Database, Progress notes, Imaging and Consultant notes    Very pleasant lady looking much younger than stated age Admitted from [facility] for [facility] dysphagia-Scoped by Dr. Henrene Pastor 01/24/14 with retained Meat particle on endoscopy  Tolerated clear this am mild non copious diarrhea, Was constipated but has eaten well and is hungry enough that she wants to eat Wishes grits and scrambled eggs-advanced diet to soft as per rec's of Dr. Henrene Pastor on d/c home-will need to continue this as well as bid PPI on d/c and then re-visit Dr. Henrene Pastor and Tania Ade soon Family will be updated a little later today about impeding d/c if tolerated  lunchh well   Verneita Griffes, MD [facility] [phone]

## 2014-01-25 NOTE — Progress Notes (Signed)
c my note elsewhere please

## 2014-01-25 NOTE — Progress Notes (Addendum)
Family Medicine Teaching Service Daily Progress Note Intern Pager: [phone]  Patient name: Tina Hampton Medical record number: [id number] Date of birth: 21-Apr-1926 Age: 80 y.o. Gender: female  Primary Care Provider: Estill Dooms, MD Consultants: GI Code Status: Full  Pt Overview and Major Events to Date:  01/24/14 EGD: acute esophageal impaction s/p removal, GERD+peptic stricture  Assessment and Plan: #: Acute Dysphagia 2/2 food impaction- resolved: Pt underwent immediate EGD on 01/24/14 with food impaction removal and plan to do outpt dilation. Pt does not appear to have esophageal dysmotility and no other lesions visible on EGD to suggest intrinsic pathology.  -cont protonix 40mg  daily -soft food diet until outpt esophageal dilation ordered  #HTN: stable SBP during admission -continue home meds ASA, nifedipine   #Depression/Anxiety: stable. Continue home meds of wellbutrin and klonopin  # Osteoporosis: will hold and restart Boniva on discharge. Continue Vitamin D and Calcium supplementation now that tolerating clears.   FEN/GI: soft diet and ADAT PPx: SCDs anticipating d/c on 01/25/14  Disposition: potential home on 01/25/14  Subjective:  NAEON. Pt tolerated EGD on 01/24/14 for removal of food impaction. Tolerating clear liquid diet w/o nausea/vomiting or diarrhea this AM. Pt denies any abdominal pain, odynophagia, or dysphagia.   Objective: Temp:  [97.4 F (36.3 C)-98.1 F (36.7 C)] 98.1 F (36.7 C) (09/12 0600) Pulse Rate:  [74-97] 74 (09/12 0600) Resp:  [12-21] 16 (09/12 0600) BP: (149-228)/(58-163) 154/66 mmHg (09/12 0600) SpO2:  [93 %-98 %] 94 % (09/12 0600) Weight:  [146 lb (66.225 kg)] 146 lb (66.225 kg) (09/11 1228) General: resting in bed HEENT: PERRL, EOMI, no scleral icterus Cardiac: RRR, no rubs, murmurs or gallops Pulm: clear to auscultation bilaterally, moving normal volumes of air Abd: soft, nontender, nondistended, BS present Ext: warm and well  perfused, no pedal edema Neuro: alert and oriented X3, cranial nerves II-XII grossly intact  Laboratory:  Recent Labs Lab 01/24/14 2100 01/25/14 0714  WBC 6.9 8.0  HGB 12.5 12.4  HCT 38.0 38.9  PLT 244 229    Recent Labs Lab 01/24/14 2100 01/25/14 0714  NA 141 142  K 3.6* 3.9  CL 105 106  CO2 25 24  BUN 21 19  CREATININE 0.88 0.82  CALCIUM 9.0 8.9  PROT 6.7  --   BILITOT 0.4  --   ALKPHOS 61  --   ALT 16  --   AST 29  --   GLUCOSE 111* 116*    Imaging/Diagnostic Tests: EGD as stated above.   Clinton Gallant, MD 01/25/2014, 10:46 AM IM PGY-3, covering [place] Intern pager: [phone], text pages welcome

## 2014-01-25 NOTE — Progress Notes (Signed)
Patient eating well.  Able to keep food down with no s/s of stricture. MD aware.

## 2014-01-27 ENCOUNTER — Encounter (HOSPITAL_COMMUNITY): Payer: Self-pay | Admitting: Internal Medicine

## 2014-01-27 ENCOUNTER — Telehealth: Payer: Self-pay

## 2014-01-27 NOTE — Telephone Encounter (Signed)
The pre visit and EGD has been scheduled and the pt is aware

## 2014-01-27 NOTE — Telephone Encounter (Signed)
Message copied by Barron Alvine on Mon Jan 27, 2014 10:48 AM ------      Message from: HUNT, [place] R      Created: Mon Jan 27, 2014 10:18 AM      Regarding: EGD with dil       Verlia Kaney,            Dr. Henrene Pastor did and EGD for a food impaction on this pt Friday. He states she will need a repeat EGD with dil in a few weeks with Dr. Ardis Hughs.            Thanks,      Vaughan Basta ------

## 2014-01-28 ENCOUNTER — Encounter: Payer: Self-pay | Admitting: Internal Medicine

## 2014-01-28 ENCOUNTER — Non-Acute Institutional Stay: Payer: Medicare Other | Admitting: Internal Medicine

## 2014-01-28 VITALS — BP 152/88 | HR 76 | Wt 148.0 lb

## 2014-01-28 DIAGNOSIS — K5900 Constipation, unspecified: Secondary | ICD-10-CM

## 2014-01-28 DIAGNOSIS — Z5189 Encounter for other specified aftercare: Secondary | ICD-10-CM | POA: Diagnosis not present

## 2014-01-28 DIAGNOSIS — I10 Essential (primary) hypertension: Secondary | ICD-10-CM | POA: Diagnosis not present

## 2014-01-28 DIAGNOSIS — M150 Primary generalized (osteo)arthritis: Secondary | ICD-10-CM

## 2014-01-28 DIAGNOSIS — N952 Postmenopausal atrophic vaginitis: Secondary | ICD-10-CM

## 2014-01-28 DIAGNOSIS — M159 Polyosteoarthritis, unspecified: Secondary | ICD-10-CM

## 2014-01-28 DIAGNOSIS — T18108A Unspecified foreign body in esophagus causing other injury, initial encounter: Secondary | ICD-10-CM

## 2014-01-28 DIAGNOSIS — M810 Age-related osteoporosis without current pathological fracture: Secondary | ICD-10-CM

## 2014-01-28 DIAGNOSIS — T18128D Food in esophagus causing other injury, subsequent encounter: Secondary | ICD-10-CM

## 2014-01-28 MED ORDER — ESTROGENS, CONJUGATED 0.625 MG/GM VA CREA: 1.0000 g | TOPICAL_CREAM | VAGINAL | Status: DC

## 2014-01-28 MED ORDER — POLYETHYLENE GLYCOL 3350 17 GM/SCOOP PO POWD
17.0000 g | Freq: Two times a day (BID) | ORAL | Status: DC | PRN
Start: 2014-01-28 — End: 2015-12-01

## 2014-01-28 MED ORDER — MELOXICAM 7.5 MG PO TABS: ORAL_TABLET | ORAL | Status: DC

## 2014-01-28 NOTE — Progress Notes (Signed)
Patient ID: Candelaria Pies, female   DOB: 28-Apr-1926, 80 y.o.   MRN: [id number]    Location:  [facility]   Place of Service: Clinic (12)    Allergies  Allergen Reactions  . Penicillins Other (See Comments)    CHILDHOOD REACTION    Chief Complaint  Patient presents with  . Hospitalization Follow-up    Went to ER 01/24/14 for vomiting had Endoscopy procedure for food impaction     HPI:  Hospitalized 01/24/14 to 01/25/14 for meat impaction in the esophagus. Had endoscopy by Dr. Henrene Pastor. Some irritation noted at site of impaction and she was switched from ranitidine to pantoprazole.  She continues to chew her food extensively prior to swallowing. She is asymptomatic today.  Has been using meloxicam prescribed by orthopedist. Also uses Boniva 150 mg monthly.  Medications: Patient's Medications  New Prescriptions   No medications on file  Previous Medications   ASPIRIN 81 MG TABLET    Take 81 mg by mouth daily.   BUPROPION (WELLBUTRIN XL) 150 MG 24 HR TABLET    Take 450 mg by mouth daily.   CALCIUM CARBONATE-VITAMIN D (CALCIUM 600+D3 PO)    Take by mouth 2 (two) times daily.   CHOLECALCIFEROL (VITAMIN D) 1000 UNITS TABLET    Take 1,000 Units by mouth daily.   CLONAZEPAM (KLONOPIN) 0.5 MG TABLET    Take 0.25 mg by mouth daily as needed for anxiety. Take half tablet 2-3 times a week for anxiety as needed for anxiety   CONJUGATED ESTROGENS (PREMARIN) VAGINAL CREAM    Place 1 g vaginally See admin instructions. Pt uses twice weekly. No certain days   GLUCOSAMINE HCL 1000 MG TABS    Take 2,000 mg by mouth 2 (two) times daily.   IBANDRONATE (BONIVA) 150 MG TABLET    Take 150 mg by mouth every 30 (thirty) days. Take in the morning with a full glass of water, on an empty stomach, and do not take anything else by mouth or lie down for the next 30 min. Last dose was on August 20th 2015   MULTIPLE VITAMIN (MULTIVITAMIN WITH MINERALS) TABS    Take 1 tablet by mouth daily.   NIFEDIPINE  (PROCARDIA-XL/ADALAT CC) 60 MG 24 HR TABLET    Take 60 mg by mouth daily.   PANTOPRAZOLE (PROTONIX) 40 MG TABLET    Take 1 tablet (40 mg total) by mouth daily.   POLYETHYL GLYCOL-PROPYL GLYCOL (SYSTANE) 0.4-0.3 % SOLN    Place 1 drop into both eyes 2 (two) times daily.    PRAVASTATIN (PRAVACHOL) 20 MG TABLET    Take 20 mg by mouth daily. Take one tablet daily for cholesterol   PROBIOTIC PRODUCT (PROBIOTIC PO)    Take 1 tablet by mouth daily.   SERTRALINE (ZOLOFT) 100 MG TABLET    Take 150 mg by mouth daily. Take 1 1/2  tablet each morning for nerves  Modified Medications   No medications on file  Discontinued Medications   No medications on file     Review of Systems  Constitutional: Negative.  Negative for unexpected weight change.  HENT: Positive for hearing loss. Negative for ear pain.   Eyes: Positive for visual disturbance (corrective lenses).  Respiratory: Negative.   Cardiovascular: Negative.   Gastrointestinal:       Heartburn. Constipation. History of hiatal hernia. Impaction of meat in the esophagus 01/24/14.  Endocrine: Negative.   Genitourinary: Positive for hematuria.       Nocturia x5-6. Increased frequency  in the daytime. Urge incontinence.  Musculoskeletal:       Chronic mild back discomfort. Some joint pains. No joint swelling, muscular pain, or muscular weakness.  Skin: Negative for rash.  Allergic/Immunologic: Negative.   Neurological: Negative.   Hematological: Negative.   Psychiatric/Behavioral: Positive for sleep disturbance.       Insomnia. Grieving over her husband's recent death.    Filed Vitals:   07-Feb-2014 0854  BP: 152/88  Pulse: 76  Weight: 148 lb (67.132 kg)   Body mass index is 27.06 kg/(m^2).  Physical Exam  Constitutional: She is oriented to person, place, and time. She appears well-developed and well-nourished. No distress.  HENT:  Head: Normocephalic.  Right Ear: External ear normal.  Left Ear: External ear normal.  Nose: Nose normal.    Mouth/Throat: Oropharynx is clear and moist.   Mild hearing loss bilaterally.  Eyes:  Wears corrective lenses. Left cataract present. Right pupil seems to be nonreactive to light.  Neck: Neck supple. No JVD present. No tracheal deviation present. No thyromegaly present.  Cardiovascular: Normal rate, regular rhythm, normal heart sounds and intact distal pulses.  Exam reveals no gallop and no friction rub.   No murmur heard. Pulmonary/Chest: Breath sounds normal. No respiratory distress. She has no wheezes. She has no rales. She exhibits no tenderness.  Abdominal: She exhibits no distension and no mass. There is no tenderness.  Musculoskeletal: She exhibits edema.  Flatfeet with collapsed arches.  Neurological: She is alert and oriented to person, place, and time. She has normal reflexes. No cranial nerve deficit. Coordination normal.  Skin: No rash noted. No erythema. No pallor.  Multiple seborrheic keratoses.  Psychiatric: She has a normal mood and affect. Her behavior is normal. Judgment and thought content normal.     Labs reviewed: Admission on 01/24/2014, Discharged on 01/25/2014  Component Date Value Ref Range Status  . WBC 01/24/2014 6.9  4.0 - 10.5 K/uL Final  . RBC 01/24/2014 4.12  3.87 - 5.11 MIL/uL Final  . Hemoglobin 01/24/2014 12.5  12.0 - 15.0 g/dL Final  . HCT 01/24/2014 38.0  36.0 - 46.0 % Final  . MCV 01/24/2014 92.2  78.0 - 100.0 fL Final  . MCH 01/24/2014 30.3  26.0 - 34.0 pg Final  . MCHC 01/24/2014 32.9  30.0 - 36.0 g/dL Final  . RDW 01/24/2014 12.8  11.5 - 15.5 % Final  . Platelets 01/24/2014 244  150 - 400 K/uL Final  . Sodium 01/24/2014 141  137 - 147 mEq/L Final  . Potassium 01/24/2014 3.6* 3.7 - 5.3 mEq/L Final  . Chloride 01/24/2014 105  96 - 112 mEq/L Final  . CO2 01/24/2014 25  19 - 32 mEq/L Final  . Glucose, Bld 01/24/2014 111* 70 - 99 mg/dL Final  . BUN 01/24/2014 21  6 - 23 mg/dL Final  . Creatinine, Ser 01/24/2014 0.88  0.50 - 1.10 mg/dL Final   . Calcium 01/24/2014 9.0  8.4 - 10.5 mg/dL Final  . Total Protein 01/24/2014 6.7  6.0 - 8.3 g/dL Final  . Albumin 01/24/2014 3.5  3.5 - 5.2 g/dL Final  . AST 01/24/2014 29  0 - 37 U/L Final  . ALT 01/24/2014 16  0 - 35 U/L Final  . Alkaline Phosphatase 01/24/2014 61  39 - 117 U/L Final  . Total Bilirubin 01/24/2014 0.4  0.3 - 1.2 mg/dL Final  . GFR calc non Af Amer 01/24/2014 58* >90 mL/min Final  . GFR calc Af Amer 01/24/2014 67* >90 mL/min  Final   Comment: (NOTE)                          The eGFR has been calculated using the CKD EPI equation.                          This calculation has not been validated in all clinical situations.                          eGFR's persistently <90 mL/min signify possible Chronic Kidney                          Disease.  . Anion gap 01/24/2014 11  5 - 15 Final  . WBC 01/25/2014 8.0  4.0 - 10.5 K/uL Final  . RBC 01/25/2014 4.06  3.87 - 5.11 MIL/uL Final  . Hemoglobin 01/25/2014 12.4  12.0 - 15.0 g/dL Final  . HCT 01/25/2014 38.9  36.0 - 46.0 % Final  . MCV 01/25/2014 95.8  78.0 - 100.0 fL Final  . MCH 01/25/2014 30.5  26.0 - 34.0 pg Final  . MCHC 01/25/2014 31.9  30.0 - 36.0 g/dL Final  . RDW 01/25/2014 12.8  11.5 - 15.5 % Final  . Platelets 01/25/2014 229  150 - 400 K/uL Final  . Sodium 01/25/2014 142  137 - 147 mEq/L Final  . Potassium 01/25/2014 3.9  3.7 - 5.3 mEq/L Final  . Chloride 01/25/2014 106  96 - 112 mEq/L Final  . CO2 01/25/2014 24  19 - 32 mEq/L Final  . Glucose, Bld 01/25/2014 116* 70 - 99 mg/dL Final  . BUN 01/25/2014 19  6 - 23 mg/dL Final  . Creatinine, Ser 01/25/2014 0.82  0.50 - 1.10 mg/dL Final  . Calcium 01/25/2014 8.9  8.4 - 10.5 mg/dL Final  . GFR calc non Af Amer 01/25/2014 63* >90 mL/min Final  . GFR calc Af Amer 01/25/2014 73* >90 mL/min Final   Comment: (NOTE)                          The eGFR has been calculated using the CKD EPI equation.                          This calculation has not been validated in all  clinical situations.                          eGFR's persistently <90 mL/min signify possible Chronic Kidney                          Disease.  . Anion gap 01/25/2014 12  5 - 15 Final    Dg Chest 2 View  01/24/2014   CLINICAL DATA:  Intractable nausea and vomiting.  EXAM: CHEST  2 VIEW  COMPARISON:  11/07/2011.  FINDINGS: Opacity projects posterior to the heart, which is most likely due to a moderate-sized hiatal hernia. This is best seen on the lateral view. Heart is normal in size. Aorta is tortuous. No hilar masses or evidence of adenopathy.  Lungs are hyperexpanded but clear. No pleural effusion. No pneumothorax.  Bony thorax is demineralized but grossly intact.  IMPRESSION: 1. Opacity projects posterior to the heart, most likely  a moderate size hiatal hernia. 2. No convincing acute cardiopulmonary disease.   Electronically Signed   By: Lajean Manes M.D.   On: 01/24/2014 13:42     Assessment/Plan 1. Food impaction of esophagus, subsequent encounter Resolved. To see Dr. Ardis Hughs in Nov 2015  2. Essential hypertension Mild elevation in SBP. She was rushing to get to this appt.  3. Senile osteoporosis Continue Boniva  4. Unspecified constipation - polyethylene glycol powder (GLYCOLAX/MIRALAX) powder; Take 17 g by mouth 2 (two) times daily as needed.  Dispense: 3350 g; Refill: 1  5. Postmenopausal atrophic vaginitis: requesting refill of medication - conjugated estrogens (PREMARIN) vaginal cream; Place 0.5 Applicatorfuls vaginally See admin instructions. Pt uses twice weekly. No certain days  Dispense: 42.5 g; Refill: 4

## 2014-01-28 NOTE — Discharge Summary (Signed)
Family Medicine Teaching Service  Discharge Note : Attending Jeff Sanjuanita Condrey MD Pager [phone] Inpatient Team Pager:  [phone]  I have reviewed this patient and the patient's chart and have discussed discharge planning with the resident at the time of discharge. I agree with the discharge plan as above.    

## 2014-02-03 ENCOUNTER — Other Ambulatory Visit: Payer: Self-pay | Admitting: *Deleted

## 2014-02-03 DIAGNOSIS — N952 Postmenopausal atrophic vaginitis: Secondary | ICD-10-CM

## 2014-02-03 MED ORDER — ESTROGENS, CONJUGATED 0.625 MG/GM VA CREA: 1.0000 g | TOPICAL_CREAM | VAGINAL | Status: DC

## 2014-02-03 NOTE — Telephone Encounter (Signed)
[facility]

## 2014-02-04 ENCOUNTER — Other Ambulatory Visit: Payer: Self-pay | Admitting: *Deleted

## 2014-02-04 DIAGNOSIS — N952 Postmenopausal atrophic vaginitis: Secondary | ICD-10-CM

## 2014-02-04 MED ORDER — ESTROGENS, CONJUGATED 0.625 MG/GM VA CREA: 1.0000 g | TOPICAL_CREAM | VAGINAL | Status: DC

## 2014-02-26 ENCOUNTER — Other Ambulatory Visit: Payer: Self-pay | Admitting: *Deleted

## 2014-02-26 ENCOUNTER — Other Ambulatory Visit (HOSPITAL_COMMUNITY): Payer: Self-pay | Admitting: Internal Medicine

## 2014-02-26 MED ORDER — PANTOPRAZOLE SODIUM 40 MG PO TBEC: 40.0000 mg | DELAYED_RELEASE_TABLET | Freq: Every day | ORAL | Status: DC

## 2014-02-26 NOTE — Telephone Encounter (Signed)
Patient called and requested. Faxed to pharmacy 

## 2014-03-01 DIAGNOSIS — Z23 Encounter for immunization: Secondary | ICD-10-CM | POA: Diagnosis not present

## 2014-03-10 ENCOUNTER — Other Ambulatory Visit: Payer: Self-pay | Admitting: Internal Medicine

## 2014-03-10 NOTE — Telephone Encounter (Signed)
Patient requested and faxed to [facility]

## 2014-03-20 ENCOUNTER — Other Ambulatory Visit: Payer: Self-pay | Admitting: *Deleted

## 2014-03-20 MED ORDER — PANTOPRAZOLE SODIUM 40 MG PO TBEC: 40.0000 mg | DELAYED_RELEASE_TABLET | Freq: Every day | ORAL | Status: DC

## 2014-03-20 NOTE — Telephone Encounter (Signed)
[facility] 

## 2014-03-25 ENCOUNTER — Other Ambulatory Visit: Payer: Self-pay

## 2014-03-25 DIAGNOSIS — M150 Primary generalized (osteo)arthritis: Principal | ICD-10-CM

## 2014-03-25 DIAGNOSIS — M159 Polyosteoarthritis, unspecified: Secondary | ICD-10-CM

## 2014-03-25 MED ORDER — MELOXICAM 7.5 MG PO TABS: ORAL_TABLET | ORAL | Status: DC

## 2014-03-28 ENCOUNTER — Ambulatory Visit (AMBULATORY_SURGERY_CENTER): Payer: Self-pay | Admitting: *Deleted

## 2014-03-28 VITALS — Ht 62.0 in | Wt 154.0 lb

## 2014-03-28 DIAGNOSIS — R1314 Dysphagia, pharyngoesophageal phase: Secondary | ICD-10-CM

## 2014-03-28 NOTE — Progress Notes (Signed)
No egg or soy allergy. No anesthesia problems.  No home O2.  No diet meds.  Refused emmi.

## 2014-04-04 ENCOUNTER — Encounter: Payer: Self-pay | Admitting: Gastroenterology

## 2014-04-04 ENCOUNTER — Ambulatory Visit (AMBULATORY_SURGERY_CENTER): Payer: Medicare Other | Admitting: Gastroenterology

## 2014-04-04 VITALS — BP 152/98 | HR 68 | Temp 98.2°F | Resp 14 | Ht 62.0 in | Wt 154.0 lb

## 2014-04-04 DIAGNOSIS — I10 Essential (primary) hypertension: Secondary | ICD-10-CM | POA: Diagnosis not present

## 2014-04-04 DIAGNOSIS — K449 Diaphragmatic hernia without obstruction or gangrene: Secondary | ICD-10-CM

## 2014-04-04 DIAGNOSIS — R1310 Dysphagia, unspecified: Secondary | ICD-10-CM | POA: Diagnosis not present

## 2014-04-04 DIAGNOSIS — N189 Chronic kidney disease, unspecified: Secondary | ICD-10-CM | POA: Diagnosis not present

## 2014-04-04 DIAGNOSIS — K222 Esophageal obstruction: Secondary | ICD-10-CM | POA: Diagnosis not present

## 2014-04-04 DIAGNOSIS — R1314 Dysphagia, pharyngoesophageal phase: Secondary | ICD-10-CM | POA: Diagnosis not present

## 2014-04-04 DIAGNOSIS — R002 Palpitations: Secondary | ICD-10-CM | POA: Diagnosis not present

## 2014-04-04 DIAGNOSIS — E669 Obesity, unspecified: Secondary | ICD-10-CM | POA: Diagnosis not present

## 2014-04-04 DIAGNOSIS — F329 Major depressive disorder, single episode, unspecified: Secondary | ICD-10-CM | POA: Diagnosis not present

## 2014-04-04 HISTORY — PX: ESOPHAGOGASTRODUODENOSCOPY ENDOSCOPY: SHX5814

## 2014-04-04 MED ORDER — SODIUM CHLORIDE 0.9 % IV SOLN: 500.0000 mL | INTRAVENOUS | Status: DC

## 2014-04-04 NOTE — Patient Instructions (Signed)
YOU HAD AN ENDOSCOPIC PROCEDURE TODAY AT [facility]: Refer to the procedure report that was given to you for any specific questions about what was found during the examination.  If the procedure report does not answer your questions, please call your gastroenterologist to clarify.  If you requested that your care partner not be given the details of your procedure findings, then the procedure report has been included in a sealed envelope for you to review at your convenience later.  YOU SHOULD EXPECT: Some feelings of bloating in the abdomen. Passage of more gas than usual.  Walking can help get rid of the air that was put into your GI tract during the procedure and reduce the bloating. If you had a lower endoscopy (such as a colonoscopy or flexible sigmoidoscopy) you may notice spotting of blood in your stool or on the toilet paper. If you underwent a bowel prep for your procedure, then you may not have a normal bowel movement for a few days.  DIET: Nothing by mouth until 10 AM, then you may have clear liquids.  If you tolerate clear liquids, you may go to a soft diet for the rest of today.  Tomorrow, you may go to a regular diet.  Chew your food well and eat slowly. Take small bites.  Drink plenty of fluids but you should avoid alcoholic beverages for 24 hours.  ACTIVITY: Your care partner should take you home directly after the procedure.  You should plan to take it easy, moving slowly for the rest of the day.  You can resume normal activity the day after the procedure however you should NOT DRIVE or use heavy machinery for 24 hours (because of the sedation medicines used during the test).    SYMPTOMS TO REPORT IMMEDIATELY: A gastroenterologist can be reached at any hour.  During normal business hours, 8:30 AM to 5:00 PM Monday through Friday, call [phone].  After hours and on weekends, please call the GI answering service at [phone] who will take a message and have the  physician on call contact you.   Following upper endoscopy (EGD)  Vomiting of blood or coffee ground material  New chest pain or pain under the shoulder blades  Painful or persistently difficult swallowing  New shortness of breath  Fever of 100F or higher  Black, tarry-looking stools  FOLLOW UP: If any biopsies were taken you will be contacted by phone or by letter within the next 1-3 weeks.  Call your gastroenterologist if you have not heard about the biopsies in 3 weeks.  Our staff will call the home number listed on your records the next business day following your procedure to check on you and address any questions or concerns that you may have at that time regarding the information given to you following your procedure. This is a courtesy call and so if there is no answer at the home number and we have not heard from you through the emergency physician on call, we will assume that you have returned to your regular daily activities without incident.  SIGNATURES/CONFIDENTIALITY: You and/or your care partner have signed paperwork which will be entered into your electronic medical record.  These signatures attest to the fact that that the information above on your After Visit Summary has been reviewed and is understood.  Full responsibility of the confidentiality of this discharge information lies with you and/or your care-partner.  Please, read the handouts given to you by your recovery room  nurse.

## 2014-04-04 NOTE — Progress Notes (Signed)
Called to room to assist during endoscopic procedure.  Patient ID and intended procedure confirmed with present staff. Received instructions for my participation in the procedure from the performing physician.  

## 2014-04-04 NOTE — Progress Notes (Signed)
Report to PACU, RN, vss, BBS= Clear.  

## 2014-04-04 NOTE — Op Note (Signed)
[place]  [address] [place], [address]   ENDOSCOPY PROCEDURE REPORT  PATIENT: Tina Hampton, Tina Hampton  MR#: [id number] BIRTHDATE: 08/30/1925 , 55  yrs. old GENDER: female ENDOSCOPIST: Milus Banister, MD PROCEDURE DATE:  04/04/2014 PROCEDURE:  EGD w/ balloon dilation ASA CLASS:     Class III INDICATIONS:  recent esophageal food impaction, treated with EGD Dr. Henrene Pastor, has been on daily ppi since then. MEDICATIONS: Monitored anesthesia care, Propofol 150 mg IV, and lidocaine 100mg  IV TOPICAL ANESTHETIC: none  DESCRIPTION OF PROCEDURE: After the risks benefits and alternatives of the procedure were thoroughly explained, informed consent was obtained.  The [id number] endoscope was introduced through the mouth and advanced to the second portion of the duodenum , Without limitations.  The instrument was slowly withdrawn as the mucosa was fully examined.  There was a large hiatal hernia (at least 1/2 of the stomach is in the chest) with typical foreshortened, tortuous esophagus.  The GE junction was slightly strictured as well, clearly benign, focal likely GERD related.  The stricture was dilated up to 29mm with CRE TTS balloon held inflated for 60 seconds.  There was typical minor mucosal tear and self limited oozing following dilation.  The examination was otherwise normal.  Retroflexed views revealed no abnormalities.     The scope was then withdrawn from the patient and the procedure completed. COMPLICATIONS: There were no immediate complications.  ENDOSCOPIC IMPRESSION: There was a large hiatal hernia (at least 1/2 of the stomach is in the chest) with typical foreshortened, tortuous esophagus.  The GE junction was slightly strictured as well, clearly benign, focal likely GERD related.  The stricture was dilated up to 68mm with CRE TTS balloon held inflated for 60 seconds.  There was typical minor mucosal tear and self limited oozing following  dilation.  The examination was otherwise normal  RECOMMENDATIONS: Continue once daily PPI (pantoprazole).  Continue to chew your food well, eat slowly and take small bites.    eSigned:  Milus Banister, MD 04/04/2014 8:52 AM

## 2014-04-07 ENCOUNTER — Telehealth: Payer: Self-pay | Admitting: *Deleted

## 2014-04-07 NOTE — Telephone Encounter (Signed)
  Follow up Call-  Call back number 04/04/2014  Post procedure Call Back phone  # [phone]  Permission to leave phone message Yes     Patient questions:  Do you have a fever, pain , or abdominal swelling? No. Pain Score  0 *  Have you tolerated food without any problems? Yes.    Have you been able to return to your normal activities? Yes.    Do you have any questions about your discharge instructions: Diet   No. Medications  No. Follow up visit  No.  Do you have questions or concerns about your Care? No.  Actions: * If pain score is 4 or above:0  No action needed, pain <4.

## 2014-04-08 ENCOUNTER — Encounter: Payer: Self-pay | Admitting: Internal Medicine

## 2014-04-15 ENCOUNTER — Encounter: Payer: Self-pay | Admitting: Internal Medicine

## 2014-04-15 ENCOUNTER — Non-Acute Institutional Stay: Payer: Medicare Other | Admitting: Internal Medicine

## 2014-04-15 VITALS — BP 148/84 | HR 84 | Wt 153.0 lb

## 2014-04-15 DIAGNOSIS — R1310 Dysphagia, unspecified: Secondary | ICD-10-CM | POA: Diagnosis not present

## 2014-04-15 DIAGNOSIS — I10 Essential (primary) hypertension: Secondary | ICD-10-CM | POA: Diagnosis not present

## 2014-04-15 DIAGNOSIS — E7800 Pure hypercholesterolemia, unspecified: Secondary | ICD-10-CM

## 2014-04-15 DIAGNOSIS — K222 Esophageal obstruction: Secondary | ICD-10-CM

## 2014-04-15 DIAGNOSIS — K449 Diaphragmatic hernia without obstruction or gangrene: Secondary | ICD-10-CM | POA: Diagnosis not present

## 2014-04-15 NOTE — Progress Notes (Signed)
Patient ID: Tina Hampton, female   DOB: 09-13-1925, 80 y.o.   MRN: [id number]    [facility]    Place of Service: Clinic (12) OFFICE   Allergies  Allergen Reactions  . Penicillins Other (See Comments)    CHILDHOOD REACTION    Chief Complaint  Patient presents with  . Medical Management of Chronic Issues    blood pressure, GERD. Had Endoscopy -Dr. Ardis Hughs 04/04/14    HPI:  Hiatal hernia: Patient had food impaction in the esophagus in September 2015. She underwent EGD on 04/04/14 by Dr. Oretha Caprice. He confirmed that she has a hiatal hernia with approximately half of her stomach herniated into the chest cavity.  Esophageal stricture: Recent EGD by Dr. Ardis Hughs with esophageal dilation  Dysphagia: Patient denies any problems with swallowing at this time. She was having some difficulty prior to the recent EGD.  High cholesterol: No recent lab test done  Essential hypertension: Controlled    Medications: Patient's Medications  New Prescriptions   No medications on file  Previous Medications   ASPIRIN 81 MG TABLET    Take 81 mg by mouth daily.   BUPROPION (WELLBUTRIN XL) 150 MG 24 HR TABLET    Take 450 mg by mouth daily.   CALCIUM CARBONATE-VITAMIN D (CALCIUM 600+D3 PO)    Take by mouth 2 (two) times daily.   CHOLECALCIFEROL (VITAMIN D) 1000 UNITS TABLET    Take 1,000 Units by mouth daily.   CLONAZEPAM (KLONOPIN) 0.5 MG TABLET    Take 0.25 mg by mouth daily as needed for anxiety. Take half tablet 2-3 times a week for anxiety as needed for anxiety   CONJUGATED ESTROGENS (PREMARIN) VAGINAL CREAM    Place 0.5 Applicatorfuls vaginally See admin instructions. Pt uses twice weekly. No certain days   GLUCOSAMINE HCL 1000 MG TABS    Take 2,000 mg by mouth 2 (two) times daily.   IBANDRONATE (BONIVA) 150 MG TABLET    Take 150 mg by mouth every 30 (thirty) days. Take in the morning with a full glass of water, on an empty stomach, and do not take anything else by mouth or  lie down for the next 30 min. Last dose was on August 20th 2015   MELOXICAM (MOBIC) 7.5 MG TABLET    One daily to help arthritis   MULTIPLE VITAMIN (MULTIVITAMIN WITH MINERALS) TABS    Take 1 tablet by mouth daily.   NIFEDIPINE (PROCARDIA-XL/ADALAT CC) 60 MG 24 HR TABLET    Take 60 mg by mouth daily.   PANTOPRAZOLE (PROTONIX) 40 MG TABLET    Take 1 tablet (40 mg total) by mouth daily.   POLYETHYL GLYCOL-PROPYL GLYCOL (SYSTANE) 0.4-0.3 % SOLN    Place 1 drop into both eyes 2 (two) times daily.    POLYETHYLENE GLYCOL POWDER (GLYCOLAX/MIRALAX) POWDER    Take 17 g by mouth 2 (two) times daily as needed.   PRAVASTATIN (PRAVACHOL) 20 MG TABLET    Take one tablet by mouth once daily for cholesterol   PROBIOTIC PRODUCT (PROBIOTIC PO)    Take 1 tablet by mouth daily.   SERTRALINE (ZOLOFT) 100 MG TABLET    Take 150 mg by mouth daily. Take 1 1/2  tablet each morning for nerves  Modified Medications   No medications on file  Discontinued Medications   No medications on file     Review of Systems  Constitutional: Negative.  Negative for unexpected weight change.  HENT: Positive for hearing loss. Negative for ear  pain.   Eyes: Positive for visual disturbance (corrective lenses).  Respiratory: Negative.   Cardiovascular: Negative.   Gastrointestinal:       Heartburn. Constipation. History of hiatal hernia with 1/2 stomach in the chest. Impaction of meat in the esophagus 01/24/14. EGD with dilation of esophageal stricture by Dr. Ardis Hughs on 04/04/14.   Endocrine: Negative.   Genitourinary: Positive for hematuria.       Nocturia x5-6. Increased frequency in the daytime. Urge incontinence.  Musculoskeletal:       Chronic mild back discomfort. Some joint pains. No joint swelling, muscular pain, or muscular weakness.  Skin: Negative for rash.  Allergic/Immunologic: Negative.   Neurological: Negative.   Hematological: Negative.   Psychiatric/Behavioral: Positive for sleep disturbance.       Insomnia..      There were no vitals filed for this visit. There is no weight on file to calculate BMI.  Physical Exam  Constitutional: She is oriented to person, place, and time. She appears well-developed and well-nourished. No distress.  HENT:  Head: Normocephalic.  Right Ear: External ear normal.  Left Ear: External ear normal.  Nose: Nose normal.  Mouth/Throat: Oropharynx is clear and moist.   Mild hearing loss bilaterally.  Eyes:  Wears corrective lenses. Left cataract present. Right pupil seems to be nonreactive to light. There are 2 white cystic areas on the left upper eyelid medially. 2 additional areas are present on the left lower lid centrally.  Neck: Neck supple. No JVD present. No tracheal deviation present. No thyromegaly present.  Cardiovascular: Normal rate, regular rhythm, normal heart sounds and intact distal pulses.  Exam reveals no gallop and no friction rub.   No murmur heard. Pulmonary/Chest: Breath sounds normal. No respiratory distress. She has no wheezes. She has no rales. She exhibits no tenderness.  Abdominal: She exhibits no distension and no mass. There is no tenderness.  Musculoskeletal: She exhibits edema.  Flat feet with collapsed arches.  Neurological: She is alert and oriented to person, place, and time. She has normal reflexes. No cranial nerve deficit. Coordination normal.  Skin: No rash noted. No erythema. No pallor.  Multiple seborrheic keratoses.  Psychiatric: She has a normal mood and affect. Her behavior is normal. Judgment and thought content normal.     Labs reviewed: Admission on 01/24/2014, Discharged on 01/25/2014  Component Date Value Ref Range Status  . WBC 01/24/2014 6.9  4.0 - 10.5 K/uL Final  . RBC 01/24/2014 4.12  3.87 - 5.11 MIL/uL Final  . Hemoglobin 01/24/2014 12.5  12.0 - 15.0 g/dL Final  . HCT 01/24/2014 38.0  36.0 - 46.0 % Final  . MCV 01/24/2014 92.2  78.0 - 100.0 fL Final  . MCH 01/24/2014 30.3  26.0 - 34.0 pg Final  . MCHC  01/24/2014 32.9  30.0 - 36.0 g/dL Final  . RDW 01/24/2014 12.8  11.5 - 15.5 % Final  . Platelets 01/24/2014 244  150 - 400 K/uL Final  . Sodium 01/24/2014 141  137 - 147 mEq/L Final  . Potassium 01/24/2014 3.6* 3.7 - 5.3 mEq/L Final  . Chloride 01/24/2014 105  96 - 112 mEq/L Final  . CO2 01/24/2014 25  19 - 32 mEq/L Final  . Glucose, Bld 01/24/2014 111* 70 - 99 mg/dL Final  . BUN 01/24/2014 21  6 - 23 mg/dL Final  . Creatinine, Ser 01/24/2014 0.88  0.50 - 1.10 mg/dL Final  . Calcium 01/24/2014 9.0  8.4 - 10.5 mg/dL Final  . Total Protein 01/24/2014 6.7  6.0 - 8.3 g/dL Final  . Albumin 01/24/2014 3.5  3.5 - 5.2 g/dL Final  . AST 01/24/2014 29  0 - 37 U/L Final  . ALT 01/24/2014 16  0 - 35 U/L Final  . Alkaline Phosphatase 01/24/2014 61  39 - 117 U/L Final  . Total Bilirubin 01/24/2014 0.4  0.3 - 1.2 mg/dL Final  . GFR calc non Af Amer 01/24/2014 58* >90 mL/min Final  . GFR calc Af Amer 01/24/2014 67* >90 mL/min Final   Comment: (NOTE)                          The eGFR has been calculated using the CKD EPI equation.                          This calculation has not been validated in all clinical situations.                          eGFR's persistently <90 mL/min signify possible Chronic Kidney                          Disease.  . Anion gap 01/24/2014 11  5 - 15 Final  . WBC 01/25/2014 8.0  4.0 - 10.5 K/uL Final  . RBC 01/25/2014 4.06  3.87 - 5.11 MIL/uL Final  . Hemoglobin 01/25/2014 12.4  12.0 - 15.0 g/dL Final  . HCT 01/25/2014 38.9  36.0 - 46.0 % Final  . MCV 01/25/2014 95.8  78.0 - 100.0 fL Final  . MCH 01/25/2014 30.5  26.0 - 34.0 pg Final  . MCHC 01/25/2014 31.9  30.0 - 36.0 g/dL Final  . RDW 01/25/2014 12.8  11.5 - 15.5 % Final  . Platelets 01/25/2014 229  150 - 400 K/uL Final  . Sodium 01/25/2014 142  137 - 147 mEq/L Final  . Potassium 01/25/2014 3.9  3.7 - 5.3 mEq/L Final  . Chloride 01/25/2014 106  96 - 112 mEq/L Final  . CO2 01/25/2014 24  19 - 32 mEq/L Final  .  Glucose, Bld 01/25/2014 116* 70 - 99 mg/dL Final  . BUN 01/25/2014 19  6 - 23 mg/dL Final  . Creatinine, Ser 01/25/2014 0.82  0.50 - 1.10 mg/dL Final  . Calcium 01/25/2014 8.9  8.4 - 10.5 mg/dL Final  . GFR calc non Af Amer 01/25/2014 63* >90 mL/min Final  . GFR calc Af Amer 01/25/2014 73* >90 mL/min Final   Comment: (NOTE)                          The eGFR has been calculated using the CKD EPI equation.                          This calculation has not been validated in all clinical situations.                          eGFR's persistently <90 mL/min signify possible Chronic Kidney                          Disease.  . Anion gap 01/25/2014 12  5 - 15 Final     Assessment/Plan  1. Hiatal hernia stable  2. Esophageal stricture Dilated  04/04/14  3. Dysphagia Improved after dilation  4. High cholesterol -lipids, future  5. Essential hypertension -CMP, future

## 2014-04-20 ENCOUNTER — Ambulatory Visit (INDEPENDENT_AMBULATORY_CARE_PROVIDER_SITE_OTHER): Payer: Medicare Other | Admitting: Family Medicine

## 2014-04-20 ENCOUNTER — Ambulatory Visit (HOSPITAL_COMMUNITY)
Admission: RE | Admit: 2014-04-20 | Discharge: 2014-04-20 | Disposition: A | Discharge status: Home / Self Care | Payer: Medicare Other | Source: Ambulatory Visit | Attending: Family Medicine | Admitting: Family Medicine

## 2014-04-20 ENCOUNTER — Encounter (HOSPITAL_COMMUNITY): Payer: Self-pay

## 2014-04-20 DIAGNOSIS — K5730 Diverticulosis of large intestine without perforation or abscess without bleeding: Secondary | ICD-10-CM | POA: Diagnosis not present

## 2014-04-20 DIAGNOSIS — K5790 Diverticulosis of intestine, part unspecified, without perforation or abscess without bleeding: Secondary | ICD-10-CM | POA: Diagnosis not present

## 2014-04-20 DIAGNOSIS — R350 Frequency of micturition: Secondary | ICD-10-CM

## 2014-04-20 DIAGNOSIS — K4090 Unilateral inguinal hernia, without obstruction or gangrene, not specified as recurrent: Secondary | ICD-10-CM | POA: Insufficient documentation

## 2014-04-20 DIAGNOSIS — R1032 Left lower quadrant pain: Secondary | ICD-10-CM | POA: Diagnosis not present

## 2014-04-20 DIAGNOSIS — R1012 Left upper quadrant pain: Secondary | ICD-10-CM

## 2014-04-20 DIAGNOSIS — N3001 Acute cystitis with hematuria: Secondary | ICD-10-CM | POA: Diagnosis not present

## 2014-04-20 DIAGNOSIS — N3000 Acute cystitis without hematuria: Secondary | ICD-10-CM | POA: Diagnosis not present

## 2014-04-20 DIAGNOSIS — K449 Diaphragmatic hernia without obstruction or gangrene: Secondary | ICD-10-CM | POA: Diagnosis not present

## 2014-04-20 LAB — POCT URINALYSIS DIPSTICK
Bilirubin, UA: NEGATIVE
Glucose, UA: NEGATIVE
Ketones, UA: NEGATIVE
Nitrite, UA: POSITIVE
PH UA: 7.5
PROTEIN UA: NEGATIVE
RBC UA: NEGATIVE
SPEC GRAV UA: 1.015
UROBILINOGEN UA: 0.2

## 2014-04-20 LAB — POCT UA - MICROSCOPIC ONLY
Casts, Ur, LPF, POC: NEGATIVE
Crystals, Ur, HPF, POC: NEGATIVE
Mucus, UA: NEGATIVE
YEAST UA: NEGATIVE

## 2014-04-20 LAB — POCT CBC
Granulocyte percent: 71.2 %G (ref 37–80)
HCT, POC: 40.9 % (ref 37.7–47.9)
Hemoglobin: 13.1 g/dL (ref 12.2–16.2)
Lymph, poc: 2.1 (ref 0.6–3.4)
MCH: 29.4 pg (ref 27–31.2)
MCHC: 31.9 g/dL (ref 31.8–35.4)
MCV: 92.3 fL (ref 80–97)
MID (cbc): 0.2 (ref 0–0.9)
MPV: 6.9 fL (ref 0–99.8)
POC Granulocyte: 5.6 (ref 2–6.9)
POC LYMPH PERCENT: 26.7 %L (ref 10–50)
POC MID %: 2.1 %M (ref 0–12)
Platelet Count, POC: 284 10*3/uL (ref 142–424)
RBC: 4.43 M/uL (ref 4.04–5.48)
RDW, POC: 13.2 %
WBC: 7.8 10*3/uL (ref 4.6–10.2)

## 2014-04-20 LAB — POCT I-STAT CREATININE: CREATININE: 1 mg/dL (ref 0.50–1.10)

## 2014-04-20 MED ORDER — HYDROCODONE-ACETAMINOPHEN 5-325 MG PO TABS: 1.0000 | ORAL_TABLET | Freq: Four times a day (QID) | ORAL | Status: DC | PRN

## 2014-04-20 MED ORDER — IOHEXOL 300 MG/ML  SOLN
100.0000 mL | Freq: Once | INTRAMUSCULAR | Status: AC | PRN
  Administered 2014-04-20: 100 mL via INTRAVENOUS

## 2014-04-20 MED ORDER — CIPROFLOXACIN HCL 250 MG PO TABS
250.0000 mg | ORAL_TABLET | Freq: Two times a day (BID) | ORAL | Status: DC
Start: 2014-04-20 — End: 2014-04-29

## 2014-04-20 MED ORDER — IOHEXOL 300 MG/ML  SOLN
50.0000 mL | Freq: Once | INTRAMUSCULAR | Status: AC | PRN
  Administered 2014-04-20: 50 mL via ORAL

## 2014-04-20 NOTE — Patient Instructions (Addendum)
FOR CT SCAN YOU WILL GO OVER TO [facility] AND MAKE SURE YOU TELL THEM THAT YOU NEED TO REGISTER AS AN OUT PATIENT FOR CT SCAN.  Start ciprofloxacin for suspected urinary tract infection. I will check the culture to make sure. We will check the CT scan of the abdomen tonight to rule out diverticulitis. Tylenol as needed for pain or if stronger medication needed, hydrocodone was prescribed but use lowest effective dose and be careful with sedation and dizziness with this medication. Return to the clinic or go to the nearest emergency room if any of your symptoms worsen or new symptoms occur.   Abdominal Pain Many things can cause abdominal pain. Usually, abdominal pain is not caused by a disease and will improve without treatment. It can often be observed and treated at home. Your health care provider will do a physical exam and possibly order blood tests and X-rays to help determine the seriousness of your pain. However, in many cases, more time must pass before a clear cause of the pain can be found. Before that point, your health care provider may not know if you need more testing or further treatment. HOME CARE INSTRUCTIONS  Monitor your abdominal pain for any changes. The following actions may help to alleviate any discomfort you are experiencing:  Only take over-the-counter or prescription medicines as directed by your health care provider.  Do not take laxatives unless directed to do so by your health care provider.  Try a clear liquid diet (broth, tea, or water) as directed by your health care provider. Slowly move to a bland diet as tolerated. SEEK MEDICAL CARE IF:  You have unexplained abdominal pain.  You have abdominal pain associated with nausea or diarrhea.  You have pain when you urinate or have a bowel movement.  You experience abdominal pain that wakes you in the night.  You have abdominal pain that is worsened or improved by eating food.  You have abdominal pain that is  worsened with eating fatty foods.  You have a fever. SEEK IMMEDIATE MEDICAL CARE IF:   Your pain does not go away within 2 hours.  You keep throwing up (vomiting).  Your pain is felt only in portions of the abdomen, such as the right side or the left lower portion of the abdomen.  You pass bloody or black tarry stools. MAKE SURE YOU:  Understand these instructions.   Will watch your condition.   Will get help right away if you are not doing well or get worse.  Document Released: 02/09/2005 Document Revised: 05/07/2013 Document Reviewed: 01/09/2013 [facility] Patient Information 2015 [place], [place]. This information is not intended to replace advice given to you by your health care provider. Make sure you discuss any questions you have with your health care provider.

## 2014-04-20 NOTE — Progress Notes (Signed)
Subjective:    Patient ID: Tina Hampton, female    DOB: 03-Mar-1926, 80 y.o.   MRN: [id number]  This chart was scribed for Wendie Agreste, MD by Chester Holstein, ED Scribe. This patient was seen in room 11 and the patient's care was started at 2:36 PM.   HPI  HPI Comments: Tina Hampton is a 80 y.o. female who presents to the [facility] complaining of worsening left lower quadrant pain with onset 5 days ago. Pt notes she has back pain at baseline but she denies any new pain in her hip. Pt has PMHx of multiple medical problems followed by Dr. Jeanmarie Hubert. Pt also has history of diverticulosis, most recently imaged on 08/28/13 by Sherrie Mustache, AGNP.  There was no diverticulitis at that time.  Pt notes that current pain is worse than what she experienced in April.  Pt was given hydrocodone and probiotics.  Pt states she has an overactive bladder with no changes in the past few days. She denies a recent UTI or bladder infection.  She denies nausea or vomiting, changes in appetite, abnormal bowel movements, fever, and dysuria.     Patient Active Problem List   Diagnosis Date Noted  . Dysphagia 01/24/2014  . Food impaction of esophagus 01/24/2014  . Esophageal stricture 01/24/2014  . Cervicalgia 08/20/2013  . Hip fracture 11/07/2011  . GERD (gastroesophageal reflux disease)   . Hypertension   . High cholesterol   . Hiatal hernia   . Scoliosis   . DDD (degenerative disc disease)   . Depression   . Senile osteoporosis 12/14/2004  . Postmenopausal atrophic vaginitis 12/14/2004  . Edema 11/26/2004  . Unspecified urinary incontinence 11/26/2004   Past Medical History  Diagnosis Date  . High cholesterol   . Hypertension   . GERD (gastroesophageal reflux disease)   . Hiatal hernia   . Scoliosis   . DDD (degenerative disc disease)   . Depression   . Pain in joint, pelvic region and thigh 11/12/2011  . Insomnia, unspecified 10/18/2011  . Abnormal mammogram,  unspecified 04/19/2011  . Chronic rhinitis 09/28/2010  . Palpitations 09/29/2009  . Chronic kidney disease, stage II (mild) 04/03/2009  . Nocturia 09/19/2008  . Other abnormal blood chemistry 04/03/2007  . Pain in joint, shoulder region 08/15/2006  . Tension headache 10/04/2005  . Pain in limb 08/26/2005  . Diverticulosis of colon (without mention of hemorrhage) 06/17/2005  . Dermatophytosis of nail 01/21/2005  . Unspecified constipation 01/21/2005  . Backache, unspecified 01/21/2005  . Postmenopausal atrophic vaginitis 12/14/2004  . Senile osteoporosis 12/14/2004  . Unspecified cataract 11/26/2004  . Edema 11/26/2004  . Unspecified urinary incontinence 11/26/2004  . Allergy   . Anxiety    Past Surgical History  Procedure Laterality Date  . Tonsillectomy      Dr.Joe Tamala Julian  . Breast surgery      benign tumor removal, left breast 1969  . Cholecystectomy  1990    Dr.Moore   . Vaginal prolapse repair  2002    Dr.Horback   . Bilateral eyelid surgery  2005    Dr.Scott  . Cataract extraction      Right  . Hip pinning,cannulated  11/07/2011    Procedure: CANNULATED HIP PINNING;  Surgeon: Jessy Oto, MD;  Location: [facility];  Service: Orthopedics;  Laterality: Left;  . Abdominal hysterectomy  1969    Dr.Braun   . Bilateral oophorectomy  2002  . Orif femoral neck fracture w/ dhs  11/07/2011  Dr.Nitka   . Colonoscopy  3/18/20008    inflammation, diverticular associated colitis -Dr. Ardis Hughs  . Cosmetic surgery    . Eye surgery    . Tubal ligation    . Esophagogastroduodenoscopy N/A 01/24/2014    Procedure: ESOPHAGOGASTRODUODENOSCOPY (EGD);  Surgeon: Irene Shipper, MD;  Location: [facility] END[facility];  Service: Endoscopy;  Laterality: N/A;  . Foreign body removal N/A 01/24/2014    Procedure: FOREIGN BODY REMOVAL;  Surgeon: Irene Shipper, MD;  Location: [facility];  Service: Endoscopy;  Laterality: N/A;  . Esophagogastroduodenoscopy endoscopy  04/04/14    Dr. Ardis Hughs   Allergies  Allergen Reactions  .  Penicillins Other (See Comments)    CHILDHOOD REACTION   Prior to Admission medications   Medication Sig Start Date End Date Taking? Authorizing Provider  aspirin 81 MG tablet Take 81 mg by mouth daily.   Yes Historical Provider, MD  buPROPion (WELLBUTRIN XL) 150 MG 24 hr tablet Take 450 mg by mouth daily.   Yes Historical Provider, MD  Calcium Carbonate-Vitamin D (CALCIUM 600+D3 PO) Take by mouth 2 (two) times daily.   Yes Historical Provider, MD  cholecalciferol (VITAMIN D) 1000 UNITS tablet Take 1,000 Units by mouth daily.   Yes Historical Provider, MD  clonazePAM (KLONOPIN) 0.5 MG tablet Take 0.25 mg by mouth daily as needed for anxiety. Take half tablet 2-3 times a week for anxiety as needed for anxiety 11/10/11  Yes Nita Sells, MD  conjugated estrogens (PREMARIN) vaginal cream Place 0.5 Applicatorfuls vaginally See admin instructions. Pt uses twice weekly. No certain days 9/56/38  Yes Mahima Pandey, MD  Glucosamine HCl 1000 MG TABS Take 2,000 mg by mouth 2 (two) times daily.   Yes Historical Provider, MD  ibandronate (BONIVA) 150 MG tablet Take 150 mg by mouth every 30 (thirty) days. Take in the morning with a full glass of water, on an empty stomach, and do not take anything else by mouth or lie down for the next 30 min. Last dose was on August 20th 2015   Yes Historical Provider, MD  meloxicam (MOBIC) 7.5 MG tablet One daily to help arthritis 03/25/14  Yes Estill Dooms, MD  Multiple Vitamin (MULTIVITAMIN WITH MINERALS) TABS Take 1 tablet by mouth daily.   Yes Historical Provider, MD  NIFEdipine (PROCARDIA-XL/ADALAT CC) 60 MG 24 hr tablet Take 60 mg by mouth daily. 11/11/13  Yes Tiffany L Reed, DO  pantoprazole (PROTONIX) 40 MG tablet Take 1 tablet (40 mg total) by mouth daily. 03/20/14  Yes Tiffany L Reed, DO  Polyethyl Glycol-Propyl Glycol (SYSTANE) 0.4-0.3 % SOLN Place 1 drop into both eyes 2 (two) times daily.    Yes Historical Provider, MD  polyethylene glycol powder  (GLYCOLAX/MIRALAX) powder Take 17 g by mouth 2 (two) times daily as needed. 01/28/14  Yes Estill Dooms, MD  pravastatin (PRAVACHOL) 20 MG tablet Take one tablet by mouth once daily for cholesterol 03/10/14  Yes Tiffany L Reed, DO  Probiotic Product (PROBIOTIC PO) Take 1 tablet by mouth daily.   Yes Historical Provider, MD  sertraline (ZOLOFT) 100 MG tablet Take 150 mg by mouth daily. Take 1 1/2  tablet each morning for nerves 10/06/12  Yes Historical Provider, MD   History   Social History  . Marital Status: Widowed    Spouse Name: N/A    Number of Children: N/A  . Years of Education: N/A   Occupational History  . retired Producer, television/film/video    Social History Main Topics  .  Smoking status: Never Smoker   . Smokeless tobacco: Never Used  . Alcohol Use: No  . Drug Use: No  . Sexual Activity: No   Other Topics Concern  . Not on file   Social History Narrative   L[facility] at [facility] since 04/14/2005   Widowed (husband expired 2014)   Has Living Will   Exercise: water aerobic  5 times a week   Walks with walker   Never smoked   Drinks moderate amount of caffeinate beverages daily   Alcohol none              Review of Systems  Constitutional: Negative for fever and activity change.  Gastrointestinal: Positive for abdominal pain. Negative for nausea, vomiting, diarrhea and constipation.  Genitourinary: Positive for frequency (baseline). Negative for dysuria.       Objective:   Physical Exam  Constitutional: She is oriented to person, place, and time. She appears well-developed and well-nourished.  HENT:  Head: Normocephalic.  Eyes: Conjunctivae are normal.  Neck: Normal range of motion. Neck supple.  Cardiovascular: Normal rate and regular rhythm.   Murmur (1-6/1 cystolic murmur at the LUSB) heard. Pulmonary/Chest: Effort normal.  Abdominal: Bowel sounds are normal. There is tenderness (greater than suprapubic) in the left lower quadrant. There is no rebound and no  guarding.  Musculoskeletal: Normal range of motion.  Neurological: She is alert and oriented to person, place, and time.  Skin: Skin is warm and dry.  Psychiatric: She has a normal mood and affect. Her behavior is normal.  Nursing note and vitals reviewed.   Filed Vitals:   04/20/14 1402  BP: 150/86  Pulse: 93  Temp: 97.5 F (36.4 C)  TempSrc: Oral  Resp: 16  Height: 5\' 2"  (1.575 m)  Weight: 151 lb 4 oz (68.607 kg)  SpO2: 96%    Results for orders placed or performed in visit on 04/20/14  POCT urinalysis dipstick  Result Value Ref Range   Color, UA Yellow    Clarity, UA Cloudy    Glucose, UA Neg    Bilirubin, UA Neg    Ketones, UA Neg    Spec Grav, UA 1.015    Blood, UA Neg    pH, UA 7.5    Protein, UA Neg    Urobilinogen, UA 0.2    Nitrite, UA Positive    Leukocytes, UA small (1+)   POCT UA - Microscopic Only  Result Value Ref Range   WBC, Ur, HPF, POC 7-14    RBC, urine, microscopic 2-4    Bacteria, U Microscopic 4+    Mucus, UA Neg    Epithelial cells, urine per micros 0-6    Crystals, Ur, HPF, POC Neg    Casts, Ur, LPF, POC Neg    Yeast, UA neg   POCT CBC  Result Value Ref Range   WBC 7.8 4.6 - 10.2 K/uL   Lymph, poc 2.1 0.6 - 3.4   POC LYMPH PERCENT 26.7 10 - 50 %L   MID (cbc) 0.2 0 - 0.9   POC MID % 2.1 0 - 12 %M   POC Granulocyte 5.6 2 - 6.9   Granulocyte percent 71.2 37 - 80 %G   RBC 4.43 4.04 - 5.48 M/uL   Hemoglobin 13.1 12.2 - 16.2 g/dL   HCT, POC 40.9 37.7 - 47.9 %   MCV 92.3 80 - 97 fL   MCH, POC 29.4 27 - 31.2 pg   MCHC 31.9 31.8 - 35.4 g/dL  RDW, POC 13.2 %   Platelet Count, POC 284 142 - 424 K/uL   MPV 6.9 0 - 99.8 fL     CT report: IMPRESSION: Significant diverticulosis of descending and sigmoid colon without definite CT evidence of acute diverticulitis.  Large hiatal hernia containing majority of stomach similar to previous exam.  Small LEFT inguinal hernia containing fat.  No definite acute intra-abdominal or intrapelvic  abnormalities  Advised tech on night of study to relay info to Ms. Pendergrass to continue Cipro, f/u with PCP this week. RTC/ER precautions.     Assessment & Plan:   Traniyah Hallett is a 80 y.o. female Urinary frequency - Plan: POCT urinalysis dipstick, POCT UA - Microscopic Only, POCT CBC, Urine culture  Diverticulosis of intestine without bleeding, unspecified intestinal tract location - Plan: POCT urinalysis dipstick, POCT UA - Microscopic Only, POCT CBC, HYDROcodone-acetaminophen (NORCO/VICODIN) 5-325 MG per tablet, CT Abdomen Pelvis W Contrast, CANCELED: CT Abdomen Pelvis Wo Contrast  Acute cystitis with hematuria - Plan: ciprofloxacin (CIPRO) 250 MG tablet, Urine culture  Abdominal pain, LLQ - Plan: CT Abdomen Pelvis W Contrast, CANCELED: CT Abdomen Pelvis Wo Contrast  Hx of diverticulosis, LLQ and suprapubic abd pain. Suspected UTI. Started Cipro (SED and cautioned on tendon issues and mental status change), sent for CT that did not indicate diverticulitis. Hernias noted - will have her follow up with PCP in next week to discuss these findings and follow up of abd pain, or if any worsening sooner - RTC or ER. Discussed results and plan by phone.   Meds ordered this encounter  Medications  . ciprofloxacin (CIPRO) 250 MG tablet    Sig: Take 1 tablet (250 mg total) by mouth 2 (two) times daily.    Dispense:  10 tablet    Refill:  0  . HYDROcodone-acetaminophen (NORCO/VICODIN) 5-325 MG per tablet    Sig: Take 1 tablet by mouth every 6 (six) hours as needed for moderate pain.    Dispense:  15 tablet    Refill:  0   Patient Instructions  FOR CT SCAN YOU WILL GO OVER TO [facility] AND MAKE SURE YOU TELL THEM THAT YOU NEED TO REGISTER AS AN OUT PATIENT FOR CT SCAN.  Start ciprofloxacin for suspected urinary tract infection. I will check the culture to make sure. We will check the CT scan of the abdomen tonight to rule out diverticulitis. Tylenol as needed for pain or if stronger  medication needed, hydrocodone was prescribed but use lowest effective dose and be careful with sedation and dizziness with this medication. Return to the clinic or go to the nearest emergency room if any of your symptoms worsen or new symptoms occur.   Abdominal Pain Many things can cause abdominal pain. Usually, abdominal pain is not caused by a disease and will improve without treatment. It can often be observed and treated at home. Your health care provider will do a physical exam and possibly order blood tests and X-rays to help determine the seriousness of your pain. However, in many cases, more time must pass before a clear cause of the pain can be found. Before that point, your health care provider may not know if you need more testing or further treatment. HOME CARE INSTRUCTIONS  Monitor your abdominal pain for any changes. The following actions may help to alleviate any discomfort you are experiencing:  Only take over-the-counter or prescription medicines as directed by your health care provider.  Do not take laxatives unless directed to  do so by your health care provider.  Try a clear liquid diet (broth, tea, or water) as directed by your health care provider. Slowly move to a bland diet as tolerated. SEEK MEDICAL CARE IF:  You have unexplained abdominal pain.  You have abdominal pain associated with nausea or diarrhea.  You have pain when you urinate or have a bowel movement.  You experience abdominal pain that wakes you in the night.  You have abdominal pain that is worsened or improved by eating food.  You have abdominal pain that is worsened with eating fatty foods.  You have a fever. SEEK IMMEDIATE MEDICAL CARE IF:   Your pain does not go away within 2 hours.  You keep throwing up (vomiting).  Your pain is felt only in portions of the abdomen, such as the right side or the left lower portion of the abdomen.  You pass bloody or black tarry stools. MAKE SURE  YOU:  Understand these instructions.   Will watch your condition.   Will get help right away if you are not doing well or get worse.  Document Released: 02/09/2005 Document Revised: 05/07/2013 Document Reviewed: 01/09/2013 [facility] Patient Information 2015 [place], [place]. This information is not intended to replace advice given to you by your health care provider. Make sure you discuss any questions you have with your health care provider.     I personally performed the services described in this documentation, which was scribed in my presence. The recorded information has been reviewed and considered, and addended by me as needed.

## 2014-04-22 ENCOUNTER — Encounter: Payer: Self-pay | Admitting: Internal Medicine

## 2014-04-22 ENCOUNTER — Non-Acute Institutional Stay (INDEPENDENT_AMBULATORY_CARE_PROVIDER_SITE_OTHER): Payer: Medicare Other | Admitting: Internal Medicine

## 2014-04-22 VITALS — BP 140/84 | HR 84 | Temp 97.5°F | Wt 152.0 lb

## 2014-04-22 DIAGNOSIS — N390 Urinary tract infection, site not specified: Secondary | ICD-10-CM

## 2014-04-22 DIAGNOSIS — K5731 Diverticulosis of large intestine without perforation or abscess with bleeding: Secondary | ICD-10-CM | POA: Diagnosis not present

## 2014-04-22 DIAGNOSIS — R32 Unspecified urinary incontinence: Secondary | ICD-10-CM

## 2014-04-22 DIAGNOSIS — I351 Nonrheumatic aortic (valve) insufficiency: Secondary | ICD-10-CM | POA: Diagnosis not present

## 2014-04-22 DIAGNOSIS — K4090 Unilateral inguinal hernia, without obstruction or gangrene, not specified as recurrent: Secondary | ICD-10-CM

## 2014-04-22 NOTE — Progress Notes (Addendum)
Patient ID: Tina Hampton, female   DOB: 02/13/1926, 80 y.o.   MRN: [id number]    [facility]     [facility] (12[facility]    Allergies  Allergen Reactions  . Penicillins Other (See Comments)    CHILDHOOD REACTION    Chief Complaint  Patient presents with  . Urgent Care follow-up    04/20/14 went for abdominal pain, Dx diverticulosis and UTI    HPI:  Patient was seen at the urgent care center in [place] on 04/20/14 for problems of increased abdominal distress. Evaluation subsequently yielded findings of urinary tract infection and diverticulosis. CT scan of the abdomen further disclosed a large hiatal hernia and a left inguinal hernia. Patient was started on antibiotics with urine infection. She is feeling much better. There is some residual discomfort in the left upper leg and groin area where the hernia is.  Urgent care doctor further noted a grade 2/6 aortic ejection murmur.  Medications: Patient's Medications  New Prescriptions   No medications on file  Previous Medications   ASPIRIN 81 MG TABLET    Take 81 mg by mouth daily.   BUPROPION (WELLBUTRIN XL) 150 MG 24 HR TABLET    Take 450 mg by mouth daily.   CALCIUM CARBONATE-VITAMIN D (CALCIUM 600+D3 PO)    Take by mouth 2 (two) times daily.   CHOLECALCIFEROL (VITAMIN D) 1000 UNITS TABLET    Take 1,000 Units by mouth daily.   CIPROFLOXACIN (CIPRO) 250 MG TABLET    Take 1 tablet (250 mg total) by mouth 2 (two) times daily.   CLONAZEPAM (KLONOPIN) 0.5 MG TABLET    Take 0.25 mg by mouth daily as needed for anxiety. Take half tablet 2-3 times a week for anxiety as needed for anxiety   CONJUGATED ESTROGENS (PREMARIN) VAGINAL CREAM    Place 0.5 Applicatorfuls vaginally See admin instructions. Pt uses twice weekly. No certain days   GLUCOSAMINE HCL 1000 MG TABS    Take 2,000 mg by mouth 2 (two) times daily.   HYDROCODONE-ACETAMINOPHEN (NORCO/VICODIN) 5-325 MG PER TABLET    Take 1 tablet by mouth every 6 (six) hours  as needed for moderate pain.   IBANDRONATE (BONIVA) 150 MG TABLET    Take 150 mg by mouth every 30 (thirty) days. Take in the morning with a full glass of water, on an empty stomach, and do not take anything else by mouth or lie down for the next 30 min. Last dose was on August 20th 2015   MELOXICAM (MOBIC) 7.5 MG TABLET    One daily to help arthritis   MULTIPLE VITAMIN (MULTIVITAMIN WITH MINERALS) TABS    Take 1 tablet by mouth daily.   NIFEDIPINE (PROCARDIA-XL/ADALAT CC) 60 MG 24 HR TABLET    Take 60 mg by mouth daily.   PANTOPRAZOLE (PROTONIX) 40 MG TABLET    Take 1 tablet (40 mg total) by mouth daily.   POLYETHYL GLYCOL-PROPYL GLYCOL (SYSTANE) 0.4-0.3 % SOLN    Place 1 drop into both eyes 2 (two) times daily.    POLYETHYLENE GLYCOL POWDER (GLYCOLAX/MIRALAX) POWDER    Take 17 g by mouth 2 (two) times daily as needed.   PRAVASTATIN (PRAVACHOL) 20 MG TABLET    Take one tablet by mouth once daily for cholesterol   PROBIOTIC PRODUCT (PROBIOTIC PO)    Take 1 tablet by mouth daily.   SERTRALINE (ZOLOFT) 100 MG TABLET    Take 150 mg by mouth daily. Take 1 1/2  tablet each   morning for nerves  Modified Medications   No medications on file  Discontinued Medications   No medications on file     Review of Systems  Constitutional: Negative.  Negative for unexpected weight change.  HENT: Positive for hearing loss. Negative for ear pain.   Eyes: Positive for visual disturbance (corrective lenses).  Respiratory: Negative.   Cardiovascular:       Aortic ejection murmur noted 04/20/14.  Gastrointestinal:       Heartburn. Constipation. History of hiatal hernia with 1/2 stomach in the chest. Impaction of meat in the esophagus 01/24/14. EGD with dilation of esophageal stricture by Dr. Jacobs on 04/04/14.  Left inguinal hernia on 04/20/14.  Endocrine: Negative.   Genitourinary: Negative for hematuria.       Nocturia x5-6. Increased frequency in the daytime. Urge incontinence.  Musculoskeletal:        Chronic mild back discomfort. Some joint pains. No joint swelling, muscular pain, or muscular weakness.  Skin: Negative for rash.  Allergic/Immunologic: Negative.   Neurological: Negative.   Hematological: Negative.   Psychiatric/Behavioral: Positive for sleep disturbance.       Insomnia..    Filed Vitals:   04/22/14 1115  BP: 140/84  Pulse: 84  Temp: 97.5 F (36.4 C)  TempSrc: Oral  Weight: 152 lb (68.947 kg)   Body mass index is 27.79 kg/(m^2).  Physical Exam  Constitutional: She is oriented to person, place, and time. She appears well-developed and well-nourished. No distress.  HENT:  Head: Normocephalic.  Right Ear: External ear normal.  Left Ear: External ear normal.  Nose: Nose normal.  Mouth/Throat: Oropharynx is clear and moist.   Mild hearing loss bilaterally.  Eyes:  Wears corrective lenses. Left cataract present. Right pupil seems to be nonreactive to light. There are 2 white cystic areas on the left upper eyelid medially. 2 additional areas are present on the left lower lid centrally.  Neck: Neck supple. No JVD present. No tracheal deviation present. No thyromegaly present.  Cardiovascular: Normal rate, regular rhythm, normal heart sounds and intact distal pulses.  Exam reveals no gallop and no friction rub.   No murmur heard. Pulmonary/Chest: Breath sounds normal. No respiratory distress. She has no wheezes. She has no rales. She exhibits no tenderness.  Abdominal: She exhibits no distension and no mass. There is no tenderness.  Tender in the left groin. Small bulge.  Musculoskeletal: She exhibits edema.  Flat feet with collapsed arches.  Neurological: She is alert and oriented to person, place, and time. She has normal reflexes. No cranial nerve deficit. Coordination normal.  Skin: No rash noted. No erythema. No pallor.  Multiple seborrheic keratoses.  Psychiatric: She has a normal mood and affect. Her behavior is normal. Judgment and thought content normal.       Labs reviewed: Hospital Outpatient Visit on 04/20/2014  Component Date Value Ref Range Status  . Creatinine, Ser 04/20/2014 1.00  0.50 - 1.10 mg/dL Final  Office Visit on 04/20/2014  Component Date Value Ref Range Status  . Color, UA 04/20/2014 Yellow   Final  . Clarity, UA 04/20/2014 Cloudy   Final  . Glucose, UA 04/20/2014 Neg   Final  . Bilirubin, UA 04/20/2014 Neg   Final  . Ketones, UA 04/20/2014 Neg   Final  . Spec Grav, UA 04/20/2014 1.015   Final  . Blood, UA 04/20/2014 Neg   Final  . pH, UA 04/20/2014 7.5   Final  . Protein, UA 04/20/2014 Neg   Final  .   Urobilinogen, UA 04/20/2014 0.2   Final  . Nitrite, UA 04/20/2014 Positive   Final  . Leukocytes, UA 04/20/2014 small (1+)   Final  . WBC, Ur, HPF, POC 04/20/2014 7-14   Final  . RBC, urine, microscopic 04/20/2014 2-4   Final  . Bacteria, U Microscopic 04/20/2014 4+   Final  . Mucus, UA 04/20/2014 Neg   Final  . Epithelial cells, urine per micros 04/20/2014 0-6   Final  . Crystals, Ur, HPF, POC 04/20/2014 Neg   Final  . Casts, Ur, LPF, POC 04/20/2014 Neg   Final  . Yeast, UA 04/20/2014 neg   Final  . WBC 04/20/2014 7.8  4.6 - 10.2 K/uL Final  . Lymph, poc 04/20/2014 2.1  0.6 - 3.4 Final  . POC LYMPH PERCENT 04/20/2014 26.7  10 - 50 %L Final  . MID (cbc) 04/20/2014 0.2  0 - 0.9 Final  . POC MID % 04/20/2014 2.1  0 - 12 %M Final  . POC Granulocyte 04/20/2014 5.6  2 - 6.9 Final  . Granulocyte percent 04/20/2014 71.2  37 - 80 %G Final  . RBC 04/20/2014 4.43  4.04 - 5.48 M/uL Final  . Hemoglobin 04/20/2014 13.1  12.2 - 16.2 g/dL Final  . HCT, POC 04/20/2014 40.9  37.7 - 47.9 % Final  . MCV 04/20/2014 92.3  80 - 97 fL Final  . MCH, POC 04/20/2014 29.4  27 - 31.2 pg Final  . MCHC 04/20/2014 31.9  31.8 - 35.4 g/dL Final  . RDW, POC 04/20/2014 13.2   Final  . Platelet Count, POC 04/20/2014 284  142 - 424 K/uL Final  . MPV 04/20/2014 6.9  0 - 99.8 fL Final  . Colony Count 04/20/2014 >=100,000 COLONIES/ML   Preliminary   . Preliminary Report 04/20/2014 Gram Negative Rods   Preliminary  Admission on 01/24/2014, Discharged on 01/25/2014  Component Date Value Ref Range Status  . WBC 01/24/2014 6.9  4.0 - 10.5 K/uL Final  . RBC 01/24/2014 4.12  3.87 - 5.11 MIL/uL Final  . Hemoglobin 01/24/2014 12.5  12.0 - 15.0 g/dL Final  . HCT 01/24/2014 38.0  36.0 - 46.0 % Final  . MCV 01/24/2014 92.2  78.0 - 100.0 fL Final  . MCH 01/24/2014 30.3  26.0 - 34.0 pg Final  . MCHC 01/24/2014 32.9  30.0 - 36.0 g/dL Final  . RDW 01/24/2014 12.8  11.5 - 15.5 % Final  . Platelets 01/24/2014 244  150 - 400 K/uL Final  . Sodium 01/24/2014 141  137 - 147 mEq/L Final  . Potassium 01/24/2014 3.6* 3.7 - 5.3 mEq/L Final  . Chloride 01/24/2014 105  96 - 112 mEq/L Final  . CO2 01/24/2014 25  19 - 32 mEq/L Final  . Glucose, Bld 01/24/2014 111* 70 - 99 mg/dL Final  . BUN 01/24/2014 21  6 - 23 mg/dL Final  . Creatinine, Ser 01/24/2014 0.88  0.50 - 1.10 mg/dL Final  . Calcium 01/24/2014 9.0  8.4 - 10.5 mg/dL Final  . Total Protein 01/24/2014 6.7  6.0 - 8.3 g/dL Final  . Albumin 01/24/2014 3.5  3.5 - 5.2 g/dL Final  . AST 01/24/2014 29  0 - 37 U/L Final  . ALT 01/24/2014 16  0 - 35 U/L Final  . Alkaline Phosphatase 01/24/2014 61  39 - 117 U/L Final  . Total Bilirubin 01/24/2014 0.4  0.3 - 1.2 mg/dL Final  . GFR calc non Af Amer 01/24/2014 58* >90 mL/min Final  . GFR calc  Af Amer 01/24/2014 67* >90 mL/min Final   Comment: (NOTE)                          The eGFR has been calculated using the CKD EPI equation.                          This calculation has not been validated in all clinical situations.                          eGFR's persistently <90 mL/min signify possible Chronic Kidney                          Disease.  . Anion gap 01/24/2014 11  5 - 15 Final  . WBC 01/25/2014 8.0  4.0 - 10.5 K/uL Final  . RBC 01/25/2014 4.06  3.87 - 5.11 MIL/uL Final  . Hemoglobin 01/25/2014 12.4  12.0 - 15.0 g/dL Final  . HCT 01/25/2014 38.9  36.0 -  46.0 % Final  . MCV 01/25/2014 95.8  78.0 - 100.0 fL Final  . MCH 01/25/2014 30.5  26.0 - 34.0 pg Final  . MCHC 01/25/2014 31.9  30.0 - 36.0 g/dL Final  . RDW 01/25/2014 12.8  11.5 - 15.5 % Final  . Platelets 01/25/2014 229  150 - 400 K/uL Final  . Sodium 01/25/2014 142  137 - 147 mEq/L Final  . Potassium 01/25/2014 3.9  3.7 - 5.3 mEq/L Final  . Chloride 01/25/2014 106  96 - 112 mEq/L Final  . CO2 01/25/2014 24  19 - 32 mEq/L Final  . Glucose, Bld 01/25/2014 116* 70 - 99 mg/dL Final  . BUN 01/25/2014 19  6 - 23 mg/dL Final  . Creatinine, Ser 01/25/2014 0.82  0.50 - 1.10 mg/dL Final  . Calcium 01/25/2014 8.9  8.4 - 10.5 mg/dL Final  . GFR calc non Af Amer 01/25/2014 63* >90 mL/min Final  . GFR calc Af Amer 01/25/2014 73* >90 mL/min Final   Comment: (NOTE)                          The eGFR has been calculated using the CKD EPI equation.                          This calculation has not been validated in all clinical situations.                          eGFR's persistently <90 mL/min signify possible Chronic Kidney                          Disease.  . Anion gap 01/25/2014 12  5 - 15 Final   Ct Abdomen Pelvis W Contrast  04/20/2014   CLINICAL DATA:  LEFT lower quadrant pain, history of colonic diverticulosis, question diverticulitis  EXAM: CT ABDOMEN AND PELVIS WITH CONTRAST  TECHNIQUE: Multidetector CT imaging of the abdomen and pelvis was performed using the standard protocol following bolus administration of intravenous contrast. Sagittal and coronal MPR images reconstructed from axial data set.  CONTRAST:  17m OMNIPAQUE IOHEXOL 300 MG/ML SOLN IV. Dilute oral contrast.  COMPARISON:  08/28/2013  FINDINGS: Minimal bibasilar atelectasis.  Large hiatal hernia containing majority of  stomach without outlet obstruction.  Gallbladder surgically absent.  Liver, spleen, pancreas, kidneys, and adrenal glands normal appearance.  Calcified and tortuous thoracic aorta.  Nonobstructive bowel gas pattern.   No bowel dilatation the or wall thickening.  Diverticulosis of descending and sigmoid colon without definite pericolic inflammatory changes to suggest acute diverticulitis.  Bladder and ureters unremarkable.  Small LEFT inguinal hernia containing fat.  No mass, adenopathy, free fluid, or free air.  Uterus surgically absent with nonvisualization of ovaries.  Normal appendix.  Severe multilevel degenerative disc and facet disease changes of the thoracolumbar spine with pronounced dextro convex scoliosis.  IMPRESSION: Significant diverticulosis of descending and sigmoid colon without definite CT evidence of acute diverticulitis.  Large hiatal hernia containing majority of stomach similar to previous exam.  Small LEFT inguinal hernia containing fat.  No definite acute intra-abdominal or intrapelvic abnormalities.   Electronically Signed   By: Mark  Boles M.D.   On: 04/20/2014 19:01    Assessment/Plan  1. Acute UTI Finish antibiotic treatment  2. Left inguinal hernia Discussed referral to surgery, but she doesn't have an interest in this right now.  3. Diverticulosis of colon with hemorrhage Incidental finding  4. Urinary incontinence, unspecified incontinence type Hopefully in part related to the recent infection  5. Aortic ejection murmur Newly noted. Asymptomatic.  

## 2014-04-23 ENCOUNTER — Other Ambulatory Visit: Payer: Self-pay | Admitting: *Deleted

## 2014-04-23 LAB — URINE CULTURE

## 2014-04-23 MED ORDER — PANTOPRAZOLE SODIUM 40 MG PO TBEC: DELAYED_RELEASE_TABLET | ORAL | Status: DC

## 2014-04-23 NOTE — Telephone Encounter (Signed)
Patient requested to be faxed to pharmacy for 90 days

## 2014-04-24 DIAGNOSIS — N390 Urinary tract infection, site not specified: Secondary | ICD-10-CM | POA: Insufficient documentation

## 2014-04-24 DIAGNOSIS — K5731 Diverticulosis of large intestine without perforation or abscess with bleeding: Secondary | ICD-10-CM | POA: Insufficient documentation

## 2014-04-24 DIAGNOSIS — I351 Nonrheumatic aortic (valve) insufficiency: Secondary | ICD-10-CM | POA: Insufficient documentation

## 2014-04-24 DIAGNOSIS — K4090 Unilateral inguinal hernia, without obstruction or gangrene, not specified as recurrent: Secondary | ICD-10-CM

## 2014-04-24 HISTORY — DX: Unilateral inguinal hernia, without obstruction or gangrene, not specified as recurrent: K40.90

## 2014-04-24 NOTE — Addendum Note (Signed)
Addended by: Estill Dooms on: 04/24/2014 11:45 AM   Modules accepted: Level of Service

## 2014-04-29 ENCOUNTER — Non-Acute Institutional Stay: Payer: Medicare Other | Admitting: Internal Medicine

## 2014-04-29 ENCOUNTER — Encounter: Payer: Self-pay | Admitting: Internal Medicine

## 2014-04-29 VITALS — BP 128/76 | HR 60 | Temp 97.9°F | Wt 152.0 lb

## 2014-04-29 DIAGNOSIS — F329 Major depressive disorder, single episode, unspecified: Secondary | ICD-10-CM

## 2014-04-29 DIAGNOSIS — K219 Gastro-esophageal reflux disease without esophagitis: Secondary | ICD-10-CM

## 2014-04-29 DIAGNOSIS — K4090 Unilateral inguinal hernia, without obstruction or gangrene, not specified as recurrent: Secondary | ICD-10-CM | POA: Diagnosis not present

## 2014-04-29 DIAGNOSIS — N390 Urinary tract infection, site not specified: Secondary | ICD-10-CM

## 2014-04-29 DIAGNOSIS — I10 Essential (primary) hypertension: Secondary | ICD-10-CM | POA: Diagnosis not present

## 2014-04-29 DIAGNOSIS — K449 Diaphragmatic hernia without obstruction or gangrene: Secondary | ICD-10-CM

## 2014-04-29 DIAGNOSIS — F32A Depression, unspecified: Secondary | ICD-10-CM

## 2014-04-29 NOTE — Progress Notes (Signed)
Patient ID: Tina Hampton, female   DOB: 1925/06/23, 80 y.o.   MRN: [id number]    [facility]     Place of Service: Clinic (12)    Allergies  Allergen Reactions  . Penicillins Other (See Comments)    CHILDHOOD REACTION    Chief Complaint  Patient presents with  . multiple complaints    "just doesn't feel any better" Getting depressed about all of this. Hernia, what should she do, diet for reflux confused about it, pain in lower left abdomen, should she just live with it, UTI has it cleared.    HPI:  Thinks she needs to  Have urine rechecked.  Worries about her GERD. Not having much indigestion or acid reflux at this time.  Left inguinal hernia: Confirmed on recent CT of the abdomen. This area is small. It bothers her a couple of days a week with mild deep discomfort. Has been no severe pain.  Hiatal hernia: Quite large with approximately half of the stomach herniated into the chest cavity. She is asymptomatic at this time.  Essential hypertension: Controlled  Gastroesophageal reflux disease without esophagitis: Controlled on PPI  Depression: Seeing Dr. Casimiro Needle she remains on both bupropion and sertraline.  Acute UTI: Previously had acute urinary tract infection which would urgent care. She has finished antibiotics. She has no symptoms right now, but worries that the infection may still be present.    Medications: Patient's Medications  New Prescriptions   No medications on file  Previous Medications   ASPIRIN 81 MG TABLET    Take 81 mg by mouth daily.   BUPROPION (WELLBUTRIN XL) 150 MG 24 HR TABLET    Take 450 mg by mouth daily.   CALCIUM CARBONATE-VITAMIN D (CALCIUM 600+D3 PO)    Take by mouth 2 (two) times daily.   CHOLECALCIFEROL (VITAMIN D) 1000 UNITS TABLET    Take 1,000 Units by mouth daily.   CLONAZEPAM (KLONOPIN) 0.5 MG TABLET    Take 0.25 mg by mouth daily as needed for anxiety. Take half tablet 2-3 times a week for anxiety as needed for  anxiety   CONJUGATED ESTROGENS (PREMARIN) VAGINAL CREAM    Place 0.5 Applicatorfuls vaginally See admin instructions. Pt uses twice weekly. No certain days   GLUCOSAMINE HCL 1000 MG TABS    Take 2,000 mg by mouth 2 (two) times daily.   HYDROCODONE-ACETAMINOPHEN (NORCO/VICODIN) 5-325 MG PER TABLET    Take 1 tablet by mouth every 6 (six) hours as needed for moderate pain.   IBANDRONATE (BONIVA) 150 MG TABLET    Take 150 mg by mouth every 30 (thirty) days. Take in the morning with a full glass of water, on an empty stomach, and do not take anything else by mouth or lie down for the next 30 min. Last dose was on August 20th 2015   MELOXICAM (MOBIC) 7.5 MG TABLET    One daily to help arthritis   MULTIPLE VITAMIN (MULTIVITAMIN WITH MINERALS) TABS    Take 1 tablet by mouth daily.   NIFEDIPINE (PROCARDIA-XL/ADALAT CC) 60 MG 24 HR TABLET    Take 60 mg by mouth daily.   PANTOPRAZOLE (PROTONIX) 40 MG TABLET    Take one tablet by mouth once daily for stomach   POLYETHYL GLYCOL-PROPYL GLYCOL (SYSTANE) 0.4-0.3 % SOLN    Place 1 drop into both eyes 2 (two) times daily.    POLYETHYLENE GLYCOL POWDER (GLYCOLAX/MIRALAX) POWDER    Take 17 g by mouth 2 (two) times daily as needed.  PRAVASTATIN (PRAVACHOL) 20 MG TABLET    Take one tablet by mouth once daily for cholesterol   PROBIOTIC PRODUCT (PROBIOTIC PO)    Take 1 tablet by mouth daily.   SERTRALINE (ZOLOFT) 100 MG TABLET    Take 150 mg by mouth daily. Take 1 1/2  tablet each morning for nerves  Modified Medications   No medications on file  Discontinued Medications   CIPROFLOXACIN (CIPRO) 250 MG TABLET    Take 1 tablet (250 mg total) by mouth 2 (two) times daily.     Review of Systems  Constitutional: Negative.  Negative for unexpected weight change.  HENT: Positive for hearing loss. Negative for ear pain.   Eyes: Positive for visual disturbance (corrective lenses).  Respiratory: Negative.   Cardiovascular:       Aortic ejection murmur noted 04/20/14.    Gastrointestinal:       Heartburn. Constipation. History of hiatal hernia with 1/2 stomach in the chest. Impaction of meat in the esophagus 01/24/14. EGD with dilation of esophageal stricture by Dr. Ardis Hughs on 04/04/14.  Left inguinal hernia on 04/20/14.  Endocrine: Negative.   Genitourinary: Negative for hematuria.       Nocturia x5-6. Increased frequency in the daytime. Urge incontinence. Recent documented urinary tract infection.  Musculoskeletal:       Chronic mild back discomfort. Some joint pains. No joint swelling, muscular pain, or muscular weakness.  Skin: Negative for rash.  Allergic/Immunologic: Negative.   Neurological: Negative.   Hematological: Negative.   Psychiatric/Behavioral: Positive for sleep disturbance.       Insomnia.Danley Danker Vitals:   04/29/14 0858  BP: 128/76  Pulse: 60  Temp: 97.9 F (36.6 C)  TempSrc: Oral  Weight: 152 lb (68.947 kg)   Body mass index is 27.79 kg/(m^2).  Physical Exam  Constitutional: She is oriented to person, place, and time. She appears well-developed and well-nourished. No distress.  HENT:  Head: Normocephalic.  Right Ear: External ear normal.  Left Ear: External ear normal.  Nose: Nose normal.  Mouth/Throat: Oropharynx is clear and moist.   Mild hearing loss bilaterally.  Eyes:  Wears corrective lenses. Left cataract present. Right pupil seems to be nonreactive to light. There are 2 white cystic areas on the left upper eyelid medially. 2 additional areas are present on the left lower lid centrally.  Neck: Neck supple. No JVD present. No tracheal deviation present. No thyromegaly present.  Cardiovascular: Normal rate, regular rhythm, normal heart sounds and intact distal pulses.  Exam reveals no gallop and no friction rub.   No murmur heard. Pulmonary/Chest: Breath sounds normal. No respiratory distress. She has no wheezes. She has no rales. She exhibits no tenderness.  Abdominal: She exhibits no distension and no mass.  There is no tenderness.  Not tender in the left groin. Small bulge.  Musculoskeletal: She exhibits edema.  Flat feet with collapsed arches.  Neurological: She is alert and oriented to person, place, and time. She has normal reflexes. No cranial nerve deficit. Coordination normal.  Skin: No rash noted. No erythema. No pallor.  Multiple seborrheic keratoses.  Psychiatric: She has a normal mood and affect. Her behavior is normal. Judgment and thought content normal.     Labs reviewed: Hospital Outpatient Visit on 04/20/2014  Component Date Value Ref Range Status  . Creatinine, Ser 04/20/2014 1.00  0.50 - 1.10 mg/dL Final  Office Visit on 04/20/2014  Component Date Value Ref Range Status  . Color, UA 04/20/2014 Yellow  Final  . Clarity, UA 04/20/2014 Cloudy   Final  . Glucose, UA 04/20/2014 Neg   Final  . Bilirubin, UA 04/20/2014 Neg   Final  . Ketones, UA 04/20/2014 Neg   Final  . Spec Grav, UA 04/20/2014 1.015   Final  . Blood, UA 04/20/2014 Neg   Final  . pH, UA 04/20/2014 7.5   Final  . Protein, UA 04/20/2014 Neg   Final  . Urobilinogen, UA 04/20/2014 0.2   Final  . Nitrite, UA 04/20/2014 Positive   Final  . Leukocytes, UA 04/20/2014 small (1+)   Final  . WBC, Ur, HPF, POC 04/20/2014 7-14   Final  . RBC, urine, microscopic 04/20/2014 2-4   Final  . Bacteria, U Microscopic 04/20/2014 4+   Final  . Mucus, UA 04/20/2014 Neg   Final  . Epithelial cells, urine per micros 04/20/2014 0-6   Final  . Crystals, Ur, HPF, POC 04/20/2014 Neg   Final  . Casts, Ur, LPF, POC 04/20/2014 Neg   Final  . Yeast, UA 04/20/2014 neg   Final  . WBC 04/20/2014 7.8  4.6 - 10.2 K/uL Final  . Lymph, poc 04/20/2014 2.1  0.6 - 3.4 Final  . POC LYMPH PERCENT 04/20/2014 26.7  10 - 50 %L Final  . MID (cbc) 04/20/2014 0.2  0 - 0.9 Final  . POC MID % 04/20/2014 2.1  0 - 12 %M Final  . POC Granulocyte 04/20/2014 5.6  2 - 6.9 Final  . Granulocyte percent 04/20/2014 71.2  37 - 80 %G Final  . RBC 04/20/2014  4.43  4.04 - 5.48 M/uL Final  . Hemoglobin 04/20/2014 13.1  12.2 - 16.2 g/dL Final  . HCT, POC 04/20/2014 40.9  37.7 - 47.9 % Final  . MCV 04/20/2014 92.3  80 - 97 fL Final  . MCH, POC 04/20/2014 29.4  27 - 31.2 pg Final  . MCHC 04/20/2014 31.9  31.8 - 35.4 g/dL Final  . RDW, POC 04/20/2014 13.2   Final  . Platelet Count, POC 04/20/2014 284  142 - 424 K/uL Final  . MPV 04/20/2014 6.9  0 - 99.8 fL Final  . Culture 04/20/2014 KLEBSIELLA PNEUMONIAE   Final  . Colony Count 04/20/2014 >=100,000 COLONIES/ML   Final  . Organism ID, Bacteria 04/20/2014 KLEBSIELLA PNEUMONIAE   Final     Assessment/Plan  1. Left inguinal hernia I don't think she needs surgical consultation at this time. The hernia seems to be a small bother to her. We discussed getting in touch with [place] or going to the emergency room immediately if she was having prolonged discomfort or pain. If the pain patterns increased to where she is bothered by it nearly every day, then surgical consultation would be worthwhile. She is not eager to consider surgery on the small site..  2. Hiatal hernia Large but asymptomatic while she takes her PPI  3. Essential hypertension Controlled  4. Gastroesophageal reflux disease without esophagitis Asymptomatic  5. Depression Chronic anxiety. Remains on relatively large dose of bupropion as well as sertraline.  6. Acute UTI -Cath urinalysis and culture

## 2014-05-01 DIAGNOSIS — N390 Urinary tract infection, site not specified: Secondary | ICD-10-CM | POA: Diagnosis not present

## 2014-05-05 ENCOUNTER — Other Ambulatory Visit: Payer: Self-pay | Admitting: *Deleted

## 2014-05-13 ENCOUNTER — Encounter: Payer: Self-pay | Admitting: Internal Medicine

## 2014-06-02 DIAGNOSIS — F333 Major depressive disorder, recurrent, severe with psychotic symptoms: Secondary | ICD-10-CM | POA: Diagnosis not present

## 2014-06-16 DIAGNOSIS — F333 Major depressive disorder, recurrent, severe with psychotic symptoms: Secondary | ICD-10-CM | POA: Diagnosis not present

## 2014-06-17 ENCOUNTER — Encounter: Payer: Self-pay | Admitting: Internal Medicine

## 2014-06-17 ENCOUNTER — Non-Acute Institutional Stay: Payer: Medicare Other | Admitting: Internal Medicine

## 2014-06-17 VITALS — BP 160/84 | HR 81 | Temp 97.2°F | Wt 152.0 lb

## 2014-06-17 DIAGNOSIS — F329 Major depressive disorder, single episode, unspecified: Secondary | ICD-10-CM | POA: Diagnosis not present

## 2014-06-17 DIAGNOSIS — R1310 Dysphagia, unspecified: Secondary | ICD-10-CM | POA: Diagnosis not present

## 2014-06-17 DIAGNOSIS — E7800 Pure hypercholesterolemia, unspecified: Secondary | ICD-10-CM

## 2014-06-17 DIAGNOSIS — R32 Unspecified urinary incontinence: Secondary | ICD-10-CM | POA: Diagnosis not present

## 2014-06-17 DIAGNOSIS — K4090 Unilateral inguinal hernia, without obstruction or gangrene, not specified as recurrent: Secondary | ICD-10-CM | POA: Diagnosis not present

## 2014-06-17 DIAGNOSIS — F32A Depression, unspecified: Secondary | ICD-10-CM

## 2014-06-17 DIAGNOSIS — K219 Gastro-esophageal reflux disease without esophagitis: Secondary | ICD-10-CM

## 2014-06-17 DIAGNOSIS — I10 Essential (primary) hypertension: Secondary | ICD-10-CM

## 2014-06-17 NOTE — Progress Notes (Signed)
Patient ID: Tina Hampton, female   DOB: [birth year], 81 y.o.   MRN: [id number]    [facility]     Place of Service: Clinic (12)    Allergies  Allergen Reactions  . Penicillins Other (See Comments)    CHILDHOOD REACTION    Chief Complaint  Patient presents with  . Medical Management of Chronic Issues    GERD, depression, Hiatal Hernia    HPI:  Feeling more depressed. Worries about her sons health. Had MI and then developed a shadow on CXR. Ultimately it was determined to be benign. Feels exhaustion. Has early morning awakening. Saw Dr Casimiro Needle recently. Using Zoloft, Wellbutrin, and Klonopin. Increased dose of sertraline 100 mg to 2 tablets daily.  BP showed mild increase in SBP. Using nifedipine.  GERD is controlled.  Medications: Patient's Medications  New Prescriptions   No medications on file  Previous Medications   ASPIRIN 81 MG TABLET    Take 81 mg by mouth daily.   BUPROPION (WELLBUTRIN XL) 150 MG 24 HR TABLET    Take 450 mg by mouth daily.   CALCIUM CARBONATE-VITAMIN D (CALCIUM 600+D3 PO)    Take by mouth 2 (two) times daily.   CHOLECALCIFEROL (VITAMIN D) 1000 UNITS TABLET    Take 1,000 Units by mouth daily.   CLONAZEPAM (KLONOPIN) 0.5 MG TABLET    Take 0.5 mg by mouth. Take one tablet twice daily  for anxiety as needed for anxiety   CONJUGATED ESTROGENS (PREMARIN) VAGINAL CREAM    Place 0.5 Applicatorfuls vaginally See admin instructions. Pt uses twice weekly. No certain days   GLUCOSAMINE HCL 1000 MG TABS    Take 2,000 mg by mouth 2 (two) times daily.   IBANDRONATE (BONIVA) 150 MG TABLET    Take 150 mg by mouth every 30 (thirty) days. Take in the morning with a full glass of water, on an empty stomach, and do not take anything else by mouth or lie down for the next 30 min. Last dose was on August 20th 2015   MELOXICAM (MOBIC) 7.5 MG TABLET    One daily to help arthritis   MULTIPLE VITAMIN (MULTIVITAMIN WITH MINERALS) TABS    Take 1 tablet by mouth  daily.   NIFEDIPINE (PROCARDIA-XL/ADALAT CC) 60 MG 24 HR TABLET    Take 60 mg by mouth daily.   PANTOPRAZOLE (PROTONIX) 40 MG TABLET    Take one tablet by mouth once daily for stomach   POLYETHYL GLYCOL-PROPYL GLYCOL (SYSTANE) 0.4-0.3 % SOLN    Place 1 drop into both eyes 2 (two) times daily.    POLYETHYLENE GLYCOL POWDER (GLYCOLAX/MIRALAX) POWDER    Take 17 g by mouth 2 (two) times daily as needed.   PRAVASTATIN (PRAVACHOL) 20 MG TABLET    Take one tablet by mouth once daily for cholesterol   PROBIOTIC PRODUCT (PROBIOTIC PO)    Take 1 tablet by mouth daily.   SERTRALINE (ZOLOFT) 100 MG TABLET    Take 150 mg by mouth daily. Take 2  tablets each morning for nerves  Modified Medications   No medications on file  Discontinued Medications   HYDROCODONE-ACETAMINOPHEN (NORCO/VICODIN) 5-325 MG PER TABLET    Take 1 tablet by mouth every 6 (six) hours as needed for moderate pain.     Review of Systems  Constitutional: Negative.  Negative for unexpected weight change.  HENT: Positive for hearing loss. Negative for ear pain.   Eyes: Positive for visual disturbance (corrective lenses).  Respiratory: Negative.   Cardiovascular:  Aortic ejection murmur noted 04/20/14.  Gastrointestinal:       Heartburn. Constipation. History of hiatal hernia with 1/2 stomach in the chest. Impaction of meat in the esophagus 01/24/14. EGD with dilation of esophageal stricture by Dr. Ardis Hughs on 04/04/14.  Left inguinal hernia on 04/20/14.  Endocrine: Negative.   Genitourinary: Negative for hematuria.       Nocturia x5-6. Increased frequency in the daytime. Urge incontinence. Recent documented urinary tract infection.  Musculoskeletal:       Chronic mild back discomfort. Some joint pains. No joint swelling, muscular pain, or muscular weakness.  Skin: Negative for rash.  Allergic/Immunologic: Negative.   Neurological: Negative.   Hematological: Negative.   Psychiatric/Behavioral: Positive for sleep disturbance.        Insomnia.Danley Danker Vitals:   06/17/14 0914  BP: 160/84  Pulse: 81  Temp: 97.2 F (36.2 C)  TempSrc: Oral  Weight: 152 lb (68.947 kg)  SpO2: 96%   Body mass index is 27.79 kg/(m^2).  Physical Exam  Constitutional: She is oriented to person, place, and time. She appears well-developed and well-nourished. No distress.  HENT:  Head: Normocephalic.  Right Ear: External ear normal.  Left Ear: External ear normal.  Nose: Nose normal.  Mouth/Throat: Oropharynx is clear and moist.   Mild hearing loss bilaterally.  Eyes:  Wears corrective lenses. Left cataract present. Right pupil seems to be nonreactive to light. There are 2 white cystic areas on the left upper eyelid medially. 2 additional areas are present on the left lower lid centrally.  Neck: Neck supple. No JVD present. No tracheal deviation present. No thyromegaly present.  Cardiovascular: Normal rate, regular rhythm, normal heart sounds and intact distal pulses.  Exam reveals no gallop and no friction rub.   No murmur heard. Pulmonary/Chest: Breath sounds normal. No respiratory distress. She has no wheezes. She has no rales. She exhibits no tenderness.  Abdominal: She exhibits no distension and no mass. There is no tenderness.  Not tender in the left groin. Small bulge.  Musculoskeletal: She exhibits edema.  Flat feet with collapsed arches. Unstable gait. Using four-wheel walker.  Neurological: She is alert and oriented to person, place, and time. She has normal reflexes. No cranial nerve deficit. Coordination normal.  Skin: No rash noted. No erythema. No pallor.  Multiple seborrheic keratoses.  Psychiatric: She has a normal mood and affect. Her behavior is normal. Judgment and thought content normal.     Labs reviewed: Hospital Outpatient Visit on 04/20/2014  Component Date Value Ref Range Status  . Creatinine, Ser 04/20/2014 1.00  0.50 - 1.10 mg/dL Final  Office Visit on 04/20/2014  Component Date Value Ref  Range Status  . Color, UA 04/20/2014 Yellow   Final  . Clarity, UA 04/20/2014 Cloudy   Final  . Glucose, UA 04/20/2014 Neg   Final  . Bilirubin, UA 04/20/2014 Neg   Final  . Ketones, UA 04/20/2014 Neg   Final  . Spec Grav, UA 04/20/2014 1.015   Final  . Blood, UA 04/20/2014 Neg   Final  . pH, UA 04/20/2014 7.5   Final  . Protein, UA 04/20/2014 Neg   Final  . Urobilinogen, UA 04/20/2014 0.2   Final  . Nitrite, UA 04/20/2014 Positive   Final  . Leukocytes, UA 04/20/2014 small (1+)   Final  . WBC, Ur, HPF, POC 04/20/2014 7-14   Final  . RBC, urine, microscopic 04/20/2014 2-4   Final  . Bacteria, U Microscopic 04/20/2014 4+  Final  . Mucus, UA 04/20/2014 Neg   Final  . Epithelial cells, urine per micros 04/20/2014 0-6   Final  . Crystals, Ur, HPF, POC 04/20/2014 Neg   Final  . Casts, Ur, LPF, POC 04/20/2014 Neg   Final  . Yeast, UA 04/20/2014 neg   Final  . WBC 04/20/2014 7.8  4.6 - 10.2 K/uL Final  . Lymph, poc 04/20/2014 2.1  0.6 - 3.4 Final  . POC LYMPH PERCENT 04/20/2014 26.7  10 - 50 %L Final  . MID (cbc) 04/20/2014 0.2  0 - 0.9 Final  . POC MID % 04/20/2014 2.1  0 - 12 %M Final  . POC Granulocyte 04/20/2014 5.6  2 - 6.9 Final  . Granulocyte percent 04/20/2014 71.2  37 - 80 %G Final  . RBC 04/20/2014 4.43  4.04 - 5.48 M/uL Final  . Hemoglobin 04/20/2014 13.1  12.2 - 16.2 g/dL Final  . HCT, POC 04/20/2014 40.9  37.7 - 47.9 % Final  . MCV 04/20/2014 92.3  80 - 97 fL Final  . MCH, POC 04/20/2014 29.4  27 - 31.2 pg Final  . MCHC 04/20/2014 31.9  31.8 - 35.4 g/dL Final  . RDW, POC 04/20/2014 13.2   Final  . Platelet Count, POC 04/20/2014 284  142 - 424 K/uL Final  . MPV 04/20/2014 6.9  0 - 99.8 fL Final  . Culture 04/20/2014 KLEBSIELLA PNEUMONIAE   Final  . Colony Count 04/20/2014 >=100,000 COLONIES/ML   Final  . Organism ID, Bacteria 04/20/2014 KLEBSIELLA PNEUMONIAE   Final     Assessment/Plan  Essential hypertension -mild elevation in systolic blood pressure today. No  change in medications.  Depression -continue with Dr. Casimiro Needle  Dysphagia -denies problems with choking or food sticking in the chest  Left inguinal hernia -says she is not even aware that it is there.  Gastroesophageal reflux disease without esophagitis -symptoms controlled. Denies heartburn.  Urinary incontinence, unspecified incontinence type -persistent problem. Currently handling this with incontinence pads.           -  High cholesterol -follow-up next visit

## 2014-06-24 DIAGNOSIS — F333 Major depressive disorder, recurrent, severe with psychotic symptoms: Secondary | ICD-10-CM | POA: Diagnosis not present

## 2014-07-09 DIAGNOSIS — F333 Major depressive disorder, recurrent, severe with psychotic symptoms: Secondary | ICD-10-CM | POA: Diagnosis not present

## 2014-07-15 DIAGNOSIS — F333 Major depressive disorder, recurrent, severe with psychotic symptoms: Secondary | ICD-10-CM | POA: Diagnosis not present

## 2014-07-21 ENCOUNTER — Other Ambulatory Visit: Payer: Self-pay | Admitting: *Deleted

## 2014-07-21 MED ORDER — IBANDRONATE SODIUM 150 MG PO TABS: 150.0000 mg | ORAL_TABLET | ORAL | Status: DC

## 2014-07-21 NOTE — Telephone Encounter (Signed)
Patient Requested and faxed to [facility]

## 2014-07-28 DIAGNOSIS — F333 Major depressive disorder, recurrent, severe with psychotic symptoms: Secondary | ICD-10-CM | POA: Diagnosis not present

## 2014-08-01 DIAGNOSIS — M4150 Other secondary scoliosis, site unspecified: Secondary | ICD-10-CM | POA: Diagnosis not present

## 2014-08-01 DIAGNOSIS — M8580 Other specified disorders of bone density and structure, unspecified site: Secondary | ICD-10-CM | POA: Diagnosis not present

## 2014-08-04 DIAGNOSIS — F333 Major depressive disorder, recurrent, severe with psychotic symptoms: Secondary | ICD-10-CM | POA: Diagnosis not present

## 2014-08-12 ENCOUNTER — Non-Acute Institutional Stay: Payer: Medicare Other | Admitting: Internal Medicine

## 2014-08-12 ENCOUNTER — Encounter: Payer: Self-pay | Admitting: Internal Medicine

## 2014-08-12 VITALS — BP 144/82 | HR 88 | Temp 98.2°F | Wt 152.0 lb

## 2014-08-12 DIAGNOSIS — J069 Acute upper respiratory infection, unspecified: Secondary | ICD-10-CM | POA: Insufficient documentation

## 2014-08-12 MED ORDER — HYDROCOD POLST-CHLORPHEN POLST 10-8 MG/5ML PO LQCR: ORAL | Status: DC

## 2014-08-12 NOTE — Progress Notes (Signed)
Patient ID: Tina Hampton, female   DOB: [birth year], 81 y.o.   MRN: [id number]    [facility]     Place of Service: Clinic (12)    Allergies  Allergen Reactions  . Penicillins Other (See Comments)    CHILDHOOD REACTION    Chief Complaint  Patient presents with  . Cough    for 2 days, dry cough. "Wants some real cough medication not Robitussin. Needs to be able to sing by Sunday." No fever, no runny nose    HPI:  Has a cold. Coughing a lot. Dry. No fever. Would like to have something for the cough.  Medications: Patient's Medications  New Prescriptions   No medications on file  Previous Medications   ASPIRIN 81 MG TABLET    Take 81 mg by mouth daily.   BUPROPION (WELLBUTRIN XL) 150 MG 24 HR TABLET    Take 450 mg by mouth daily.   CALCIUM CARBONATE-VITAMIN D (CALCIUM 600+D3 PO)    Take by mouth 2 (two) times daily.   CHOLECALCIFEROL (VITAMIN D) 1000 UNITS TABLET    Take 1,000 Units by mouth daily.   CLONAZEPAM (KLONOPIN) 0.5 MG TABLET    Take 0.5 mg by mouth. Take one tablet twice daily  for anxiety as needed for anxiety   CONJUGATED ESTROGENS (PREMARIN) VAGINAL CREAM    Place 0.5 Applicatorfuls vaginally See admin instructions. Pt uses twice weekly. No certain days   GLUCOSAMINE HCL 1000 MG TABS    Take 2,000 mg by mouth 2 (two) times daily.   IBANDRONATE (BONIVA) 150 MG TABLET    Take 1 tablet (150 mg total) by mouth every 30 (thirty) days. Take in the morning with a full glass of water, on an empty stomach, and do not take anything else by mouth or lie down for the next 30 min.   MELOXICAM (MOBIC) 7.5 MG TABLET    One daily to help arthritis   MULTIPLE VITAMIN (MULTIVITAMIN WITH MINERALS) TABS    Take 1 tablet by mouth daily.   NIFEDIPINE (PROCARDIA-XL/ADALAT CC) 60 MG 24 HR TABLET    Take 60 mg by mouth daily.   PANTOPRAZOLE (PROTONIX) 40 MG TABLET    Take one tablet by mouth once daily for stomach   POLYETHYL GLYCOL-PROPYL GLYCOL (SYSTANE) 0.4-0.3 % SOLN     Place 1 drop into both eyes 2 (two) times daily.    POLYETHYLENE GLYCOL POWDER (GLYCOLAX/MIRALAX) POWDER    Take 17 g by mouth 2 (two) times daily as needed.   PRAVASTATIN (PRAVACHOL) 20 MG TABLET    Take one tablet by mouth once daily for cholesterol   PROBIOTIC PRODUCT (PROBIOTIC PO)    Take 1 tablet by mouth daily.   VENLAFAXINE XR (EFFEXOR-XR) 150 MG 24 HR CAPSULE    Take 150 mg by mouth every morning. Take one capsule every morning for depression  Modified Medications   No medications on file  Discontinued Medications   SERTRALINE (ZOLOFT) 100 MG TABLET    Take 150 mg by mouth daily. Take 2  tablets each morning for nerves     Review of Systems  Constitutional: Negative.  Negative for unexpected weight change.  HENT: Positive for hearing loss. Negative for ear pain.   Eyes: Positive for visual disturbance (corrective lenses).  Respiratory: Positive for cough.   Cardiovascular:       Aortic ejection murmur noted 04/20/14.  Gastrointestinal:       Heartburn. Constipation. History of hiatal hernia with 1/2 stomach  in the chest. Impaction of meat in the esophagus 01/24/14. EGD with dilation of esophageal stricture by Dr. Ardis Hughs on 04/04/14.  Left inguinal hernia on 04/20/14.  Endocrine: Negative.   Genitourinary: Negative for hematuria.       Nocturia x5-6. Increased frequency in the daytime. Urge incontinence. Recent documented urinary tract infection.  Musculoskeletal:       Chronic mild back discomfort. Some joint pains. No joint swelling, muscular pain, or muscular weakness.  Skin: Negative for rash.  Allergic/Immunologic: Negative.   Neurological: Negative.   Hematological: Negative.   Psychiatric/Behavioral: Positive for sleep disturbance.       Insomnia.Danley Danker Vitals:   08/12/14 0851  BP: 144/82  Pulse: 88  Temp: 98.2 F (36.8 C)  TempSrc: Oral  Weight: 152 lb (68.947 kg)  SpO2: 96%   Body mass index is 27.79 kg/(m^2).  Physical Exam  Constitutional: She is  oriented to person, place, and time. She appears well-developed and well-nourished. No distress.  HENT:  Head: Normocephalic.  Right Ear: External ear normal.  Left Ear: External ear normal.  Nose: Nose normal.  Mouth/Throat: Oropharynx is clear and moist.   Mild hearing loss bilaterally.  Eyes:  Wears corrective lenses. Left cataract present. Right pupil seems to be nonreactive to light. There are 2 white cystic areas on the left upper eyelid medially. 2 additional areas are present on the left lower lid centrally.  Neck: Neck supple. No JVD present. No tracheal deviation present. No thyromegaly present.  Cardiovascular: Normal rate, regular rhythm, normal heart sounds and intact distal pulses.  Exam reveals no gallop and no friction rub.   No murmur heard. Pulmonary/Chest: Breath sounds normal. No respiratory distress. She has no wheezes. She has no rales. She exhibits no tenderness.  Abdominal: She exhibits no distension and no mass. There is no tenderness.  Not tender in the left groin. Small bulge.  Musculoskeletal: She exhibits edema.  Flat feet with collapsed arches. Unstable gait. Using four-wheel walker.  Neurological: She is alert and oriented to person, place, and time. She has normal reflexes. No cranial nerve deficit. Coordination normal.  Skin: No rash noted. No erythema. No pallor.  Multiple seborrheic keratoses.  Psychiatric: She has a normal mood and affect. Her behavior is normal. Judgment and thought content normal.     Labs reviewed: No visits with results within 3 Month(s) from this visit. Latest known visit with results is:  Hospital Outpatient Visit on 04/20/2014  Component Date Value Ref Range Status  . Creatinine, Ser 04/20/2014 1.00  0.50 - 1.10 mg/dL Final     Assessment/Plan  1. Acute upper respiratory infection  - chlorpheniramine-HYDROcodone (TUSSIONEX PENNKINETIC ER) 10-8 MG/5ML LQCR; Take one tsp every 12 hours if needed to control  cough   Dispense: 120 mL; Refill: 0

## 2014-11-04 DIAGNOSIS — M810 Age-related osteoporosis without current pathological fracture: Secondary | ICD-10-CM | POA: Diagnosis not present

## 2014-11-04 DIAGNOSIS — Z1231 Encounter for screening mammogram for malignant neoplasm of breast: Secondary | ICD-10-CM | POA: Diagnosis not present

## 2014-11-04 LAB — HM DEXA SCAN

## 2014-11-07 ENCOUNTER — Encounter: Payer: Self-pay | Admitting: *Deleted

## 2014-11-10 DIAGNOSIS — E780 Pure hypercholesterolemia: Secondary | ICD-10-CM | POA: Diagnosis not present

## 2014-11-10 DIAGNOSIS — I10 Essential (primary) hypertension: Secondary | ICD-10-CM | POA: Diagnosis not present

## 2014-11-10 LAB — LIPID PANEL
Cholesterol: 163 mg/dL (ref 0–200)
HDL: 59 mg/dL (ref 35–70)
LDL CALC: 86 mg/dL
Triglycerides: 90 mg/dL (ref 40–160)

## 2014-11-10 LAB — BASIC METABOLIC PANEL
BUN: 16 mg/dL (ref 4–21)
Creatinine: 0.9 mg/dL (ref 0.5–1.1)
Glucose: 109 mg/dL
POTASSIUM: 4.6 mmol/L (ref 3.4–5.3)
Sodium: 142 mmol/L (ref 137–147)

## 2014-11-10 LAB — HEPATIC FUNCTION PANEL
ALT: 15 U/L (ref 7–35)
AST: 22 U/L (ref 13–35)
Alkaline Phosphatase: 58 U/L (ref 25–125)
BILIRUBIN, TOTAL: 0.4 mg/dL

## 2014-11-18 ENCOUNTER — Non-Acute Institutional Stay: Payer: Medicare Other | Admitting: Internal Medicine

## 2014-11-18 ENCOUNTER — Encounter: Payer: Self-pay | Admitting: Internal Medicine

## 2014-11-18 VITALS — BP 138/84 | HR 80 | Temp 98.1°F | Resp 14 | Ht 62.0 in | Wt 154.0 lb

## 2014-11-18 DIAGNOSIS — N895 Stricture and atresia of vagina: Secondary | ICD-10-CM | POA: Diagnosis not present

## 2014-11-18 DIAGNOSIS — E7800 Pure hypercholesterolemia, unspecified: Secondary | ICD-10-CM

## 2014-11-18 DIAGNOSIS — F329 Major depressive disorder, single episode, unspecified: Secondary | ICD-10-CM | POA: Diagnosis not present

## 2014-11-18 DIAGNOSIS — I10 Essential (primary) hypertension: Secondary | ICD-10-CM

## 2014-11-18 DIAGNOSIS — M810 Age-related osteoporosis without current pathological fracture: Secondary | ICD-10-CM | POA: Diagnosis not present

## 2014-11-18 DIAGNOSIS — K219 Gastro-esophageal reflux disease without esophagitis: Secondary | ICD-10-CM

## 2014-11-18 DIAGNOSIS — R609 Edema, unspecified: Secondary | ICD-10-CM | POA: Diagnosis not present

## 2014-11-18 DIAGNOSIS — R1310 Dysphagia, unspecified: Secondary | ICD-10-CM

## 2014-11-18 DIAGNOSIS — F32A Depression, unspecified: Secondary | ICD-10-CM

## 2014-11-18 DIAGNOSIS — K4090 Unilateral inguinal hernia, without obstruction or gangrene, not specified as recurrent: Secondary | ICD-10-CM

## 2014-11-18 DIAGNOSIS — I779 Disorder of arteries and arterioles, unspecified: Secondary | ICD-10-CM

## 2014-11-18 DIAGNOSIS — I351 Nonrheumatic aortic (valve) insufficiency: Secondary | ICD-10-CM

## 2014-11-18 DIAGNOSIS — R739 Hyperglycemia, unspecified: Secondary | ICD-10-CM

## 2014-11-18 DIAGNOSIS — N952 Postmenopausal atrophic vaginitis: Secondary | ICD-10-CM

## 2014-11-18 HISTORY — DX: Age-related osteoporosis without current pathological fracture: M81.0

## 2014-11-18 HISTORY — DX: Stricture and atresia of vagina: N89.5

## 2014-11-18 HISTORY — DX: Disorder of arteries and arterioles, unspecified: I77.9

## 2014-11-18 HISTORY — DX: Hyperglycemia, unspecified: R73.9

## 2014-11-18 NOTE — Progress Notes (Signed)
Passed clock drawing 

## 2014-11-18 NOTE — Progress Notes (Addendum)
Patient ID: Tina Hampton, female   DOB: 05/14/1926, 81 y.o.   MRN: [id number]    HISTORY AND PHYSICAL  Location:  [place] of Service: Clinic (12)   Extended Emergency Contact Information Primary Emergency Contact: [place], [place] [address] Johnnette Litter of [place] Phone: [phone] Mobile Phone: [phone] Relation: Son  Advanced Directive information Does patient have an advance directive?: Yes, Type of Advance Directive: Healthcare Power of [place];Living will;Out of facility DNR (pink MOST or yellow form), Pre-existing out of facility DNR order (yellow form or pink MOST form): Pink MOST form placed in chart (order not valid for inpatient use)  Chief Complaint  Patient presents with  . Annual Exam    Comprehensive exam: blood pressure, cholesterol, GERD, depression  . skin    tag on neck, can Dr. Nyoka Cowden remove it  . niddle    left tender  . Edema    in ankles if she sets a while  . vaginal    there is an area in vaginal area that she hits whe she puts the Premarin applicator in. Question scar tissue.    HPI:  Essential hypertension: Controlled  Gastroesophageal reflux disease without esophagitis: Denies reflux or chest discomfort. She has a hiatal hernia with a large proportion in the thoracic cavity. Sometimes she feels a mild discomfort in substernal area that she believes is related to this. It is more common after eating and is not exertionally related.  Aortic ejection murmur: Unchanged at 3/6 intensity widely radiating across the precordium and into the lower carotids.  Postmenopausal atrophic vaginitis: Patient encounters a blockage which seems to be in the lower vaginal area when she applies her hormone cream.  Vaginal stenosis: Tissue blockage encountered sometimes when inserting vaginal hormone cream  High cholesterol: Controlled  Dysphagia: Denies difficulty swallowing any of her pills. Denies coughing at  meals.  Left inguinal hernia: Denies pain discomfort or enlargement  Edema: Resolved  Depression: Continues to see Dr. Norma Fredrickson. Feels that she is doing well with her depression at this time.  PAOD (peripheral arterial occlusive disease): New diagnosis today following my inability to palpate adequate pulsations in the right dorsalis pedis and posterior tibial. She also has weaker pulsations in the right popliteal is compared to the left.  Osteoporosis, senile: Bone density was repeated late June 2016 at [facility]. Result pending. She continues to take Boniva. She has had previous treatment with bisphosphonate, but she is unable to say when she either started the [facility] or how long she was on other bisphosphonates.  Hyperglycemia: Most recent lab shows a very mild elevation in glucose to 109 mg percent. On 01/25/2014 glucose was 116 mg percent    Past Medical History  Diagnosis Date  . High cholesterol   . Hypertension   . GERD (gastroesophageal reflux disease)   . Hiatal hernia   . Scoliosis   . DDD (degenerative disc disease)   . Depression   . Pain in joint, pelvic region and thigh 11/12/2011  . Insomnia, unspecified 10/18/2011  . Abnormal mammogram, unspecified 04/19/2011  . Chronic rhinitis 09/28/2010  . Palpitations 09/29/2009  . Chronic kidney disease, stage II (mild) 04/03/2009  . Nocturia 09/19/2008  . Other abnormal blood chemistry 04/03/2007  . Pain in joint, shoulder region 08/15/2006  . Tension headache 10/04/2005  . Pain in limb 08/26/2005  . Diverticulosis of colon (without mention of hemorrhage) 06/17/2005  . Dermatophytosis  of nail 01/21/2005  . Unspecified constipation 01/21/2005  . Backache, unspecified 01/21/2005  . Postmenopausal atrophic vaginitis 12/14/2004  . Senile osteoporosis 12/14/2004  . Unspecified cataract 11/26/2004  . Edema 11/26/2004  . Unspecified urinary incontinence 11/26/2004  . Allergy   . Anxiety   . Osteoporosis, senile 11/18/2014  . Vaginal stenosis  11/18/2014    Tight band about 1/2 inches into the vagina which will not allow passage of my index finger.   Marland Kitchen PAOD (peripheral arterial occlusive disease) 11/18/2014    Right leg; diminished popliteal, dorsalis pedis, and posterior tibial artery pulsations   . Hyperglycemia 11/18/2014    Past Surgical History  Procedure Laterality Date  . Tonsillectomy      Dr.Joe Tamala Julian  . Breast surgery      benign tumor removal, left breast 1969  . Cholecystectomy  1990    Dr.Moore   . Vaginal prolapse repair  2002    Dr.Horback   . Bilateral eyelid surgery  2005    Dr.Scott  . Cataract extraction      Right  . Hip pinning,cannulated  11/07/2011    Procedure: CANNULATED HIP PINNING;  Surgeon: Jessy Oto, MD;  Location: [facility];  Service: Orthopedics;  Laterality: Left;  . Abdominal hysterectomy  1969    Dr.Braun   . Bilateral oophorectomy  2002  . Orif femoral neck fracture w/ dhs  11/07/2011    Dr.Nitka   . Colonoscopy  3/18/20008    inflammation, diverticular associated colitis -Dr. Ardis Hughs  . Cosmetic surgery    . Eye surgery    . Tubal ligation    . Esophagogastroduodenoscopy N/A 01/24/2014    Procedure: ESOPHAGOGASTRODUODENOSCOPY (EGD);  Surgeon: Irene Shipper, MD;  Location: [facility] ENDOSCOPY;  Service: Endoscopy;  Laterality: N/A;  . Foreign body removal N/A 01/24/2014    Procedure: FOREIGN BODY REMOVAL;  Surgeon: Irene Shipper, MD;  Location: [facility];  Service: Endoscopy;  Laterality: N/A;  . Esophagogastroduodenoscopy endoscopy  04/04/14    Dr. Ardis Hughs    Patient Care Team: Estill Dooms, MD as PCP - General (Internal Medicine) [facility] Clent Jacks, MD as Consulting Physician (Ophthalmology) Milus Banister, MD as Consulting Physician (Gastroenterology) Norma Fredrickson, MD as Consulting Physician (Psychiatry) Jessy Oto, MD as Consulting Physician (Orthopedic Surgery) Bjorn Loser, MD as Consulting Physician (Urology)  History   Social History  . Marital Status:  Widowed    Spouse Name: N/A  . Number of Children: N/A  . Years of Education: N/A   Occupational History  . retired Producer, television/film/video    Social History Main Topics  . Smoking status: Never Smoker   . Smokeless tobacco: Never Used  . Alcohol Use: No  . Drug Use: No  . Sexual Activity: No   Other Topics Concern  . Not on file   Social History Narrative   Lives at [facility] since 04/14/2005   Widowed (husband expired 2014)   Has Living Will, POA, MOST   Exercise: water aerobic  5 times a week   Walks with walker   Never smoked   Drinks moderate amount of caffeinate beverages daily   Alcohol none           reports that she has never smoked. She has never used smokeless tobacco. She reports that she does not drink alcohol or use illicit drugs.  Family History  Problem Relation Age of Onset  . Heart disease Mother   . Stroke Father   .  Heart disease Brother   . Esophageal cancer Neg Hx   . Stomach cancer Neg Hx   . Colon cancer Neg Hx    Family Status  Relation Status Death Age  . Mother Deceased   . Father Deceased   . Brother Deceased   . Son Alive   . Son Alive   . Son Alive   . Son Deceased 0    premature  . Son Deceased 4    reaction to  Penicillin     Immunization History  Administered Date(s) Administered  . Influenza Whole 02/14/2012, 02/14/2013  . Influenza-Unspecified 02/27/2014  . Pneumococcal Polysaccharide-23 05/16/1998  . Td 05/16/2004  . Zoster 05/16/2005    Allergies  Allergen Reactions  . Penicillins Other (See Comments)    CHILDHOOD REACTION    Medications: Patient's Medications  New Prescriptions   No medications on file  Previous Medications   ASPIRIN 81 MG TABLET    Take 81 mg by mouth daily.   BUPROPION (WELLBUTRIN XL) 150 MG 24 HR TABLET    Take 450 mg by mouth daily.   CALCIUM CARBONATE-VITAMIN D (CALCIUM 600+D3 PO)    Take by mouth 2 (two) times daily.   CHLORPHENIRAMINE-HYDROCODONE (TUSSIONEX PENNKINETIC ER) 10-8  MG/5ML LQCR    Take one tsp every 12 hours if needed to control  cough   CHOLECALCIFEROL (VITAMIN D) 1000 UNITS TABLET    Take 1,000 Units by mouth daily.   CLONAZEPAM (KLONOPIN) 0.5 MG TABLET    Take 0.5 mg by mouth. Take one tablet twice daily  for anxiety as needed for anxiety   CONJUGATED ESTROGENS (PREMARIN) VAGINAL CREAM    Place 0.5 Applicatorfuls vaginally See admin instructions. Pt uses twice weekly. No certain days   GLUCOSAMINE HCL 1000 MG TABS    Take 2,000 mg by mouth 2 (two) times daily.   IBANDRONATE (BONIVA) 150 MG TABLET    Take 1 tablet (150 mg total) by mouth every 30 (thirty) days. Take in the morning with a full glass of water, on an empty stomach, and do not take anything else by mouth or lie down for the next 30 min.   MELOXICAM (MOBIC) 7.5 MG TABLET    One daily to help arthritis   MULTIPLE VITAMIN (MULTIVITAMIN WITH MINERALS) TABS    Take 1 tablet by mouth daily.   NIFEDIPINE (PROCARDIA-XL/ADALAT CC) 60 MG 24 HR TABLET    Take 60 mg by mouth daily.   PANTOPRAZOLE (PROTONIX) 40 MG TABLET    Take one tablet by mouth once daily for stomach   POLYETHYL GLYCOL-PROPYL GLYCOL (SYSTANE) 0.4-0.3 % SOLN    Place 1 drop into both eyes 2 (two) times daily.    POLYETHYLENE GLYCOL POWDER (GLYCOLAX/MIRALAX) POWDER    Take 17 g by mouth 2 (two) times daily as needed.   PRAVASTATIN (PRAVACHOL) 20 MG TABLET    Take one tablet by mouth once daily for cholesterol   PROBIOTIC PRODUCT (PROBIOTIC PO)    Take 1 tablet by mouth daily.   VENLAFAXINE XR (EFFEXOR-XR) 150 MG 24 HR CAPSULE    Take 150 mg by mouth every morning. Take one capsule every morning for depression  Modified Medications   No medications on file  Discontinued Medications   No medications on file    Review of Systems  Constitutional: Negative.  Negative for unexpected weight change.  HENT: Positive for hearing loss. Negative for ear pain.   Eyes: Positive for visual disturbance (corrective lenses).  Respiratory: Positive  for cough.   Cardiovascular:       Aortic ejection murmur noted 04/20/14.  Gastrointestinal:       Heartburn. Constipation. History of hiatal hernia with 1/2 stomach in the chest. Impaction of meat in the esophagus 01/24/14. EGD with dilation of esophageal stricture by Dr. Ardis Hughs on 04/04/14.  Left inguinal hernia on 04/20/14.  Endocrine: Negative for polydipsia, polyphagia and polyuria.       History of mild hyperglycemia in September 2015 and June 2016.  Genitourinary: Negative for hematuria.       Nocturia x5-6. Increased frequency in the daytime. Urge incontinence. Hx urinary tract infection. Vaginal atrophy and stenosis  Musculoskeletal:       Chronic mild back discomfort. Some joint pains. No joint swelling, muscular pain, or muscular weakness.  Skin: Negative for rash.  Allergic/Immunologic: Negative.   Neurological:       11/18/14 MMSE 30/30. Passed clock drawing.  Hematological: Negative.   Psychiatric/Behavioral: Positive for sleep disturbance.       Insomnia.Danley Danker Vitals:   11/18/14 1114  BP: 138/84  Pulse: 80  Temp: 98.1 F (36.7 C)  Resp: 14  Height: 5\' 2"  (1.575 m)  Weight: 154 lb (69.854 kg)   Body mass index is 28.16 kg/(m^2).  Physical Exam  Constitutional: She is oriented to person, place, and time. She appears well-developed and well-nourished. No distress.  HENT:  Head: Normocephalic.  Right Ear: External ear normal.  Left Ear: External ear normal.  Nose: Nose normal.  Mouth/Throat: Oropharynx is clear and moist.   Mild hearing loss bilaterally.  Eyes:  Wears corrective lenses. Left cataract present. Right pupil seems to be nonreactive to light. There are 2 white cystic areas on the left upper eyelid medially. 2 additional areas are present on the left lower lid centrally.  Neck: Neck supple. No JVD present. No tracheal deviation present. No thyromegaly present.  Cardiovascular: Normal rate and regular rhythm.  Exam reveals no gallop and no  friction rub.   No murmur heard. 3/6 aortic ejection murmur with radiation towards left sternal border as well.  Diminished pulsation at right popliteal, right posterior tibial, and right dorsalis pedis.  Pulmonary/Chest: Breath sounds normal. No respiratory distress. She has no wheezes. She has no rales. She exhibits no tenderness.  Abdominal: She exhibits no distension and no mass. There is no tenderness.  Not tender in the left groin. Small bulge.  Genitourinary: Guaiac negative stool. No vaginal discharge found.  Approximately 1-1/2 inches into the vagina, there is a thin tissue band at is circumferential and narrows the vagina. I'm unable to penetrate through the band with my index finger. There is no tenderness, bleeding, or other excessive tissue growth in this area.  Musculoskeletal: She exhibits edema.  Flat feet with collapsed arches. Unstable gait. Using cane.  Neurological: She is alert and oriented to person, place, and time. She has normal reflexes. No cranial nerve deficit. Coordination normal.  Skin: No rash noted. No erythema. No pallor.  Multiple seborrheic keratoses.  Psychiatric: She has a normal mood and affect. Her behavior is normal. Judgment and thought content normal.     Labs reviewed: Nursing Home on 11/18/2014  Component Date Value Ref Range Status  . Glucose 11/10/2014 109   Final  . BUN 11/10/2014 16  4 - 21 mg/dL Final  . Creatinine 11/10/2014 0.9  0.5 - 1.1 mg/dL Final  . Potassium 11/10/2014 4.6  3.4 - 5.3 mmol/L Final  . Sodium 11/10/2014 142  137 - 147 mmol/L Final  . Triglycerides 11/10/2014 90  40 - 160 mg/dL Final  . Cholesterol 11/10/2014 163  0 - 200 mg/dL Final  . HDL 11/10/2014 59  35 - 70 mg/dL Final  . LDL Cholesterol 11/10/2014 86   Final  . Alkaline Phosphatase 11/10/2014 58  25 - 125 U/L Final  . ALT 11/10/2014 15  7 - 35 U/L Final  . AST 11/10/2014 22  13 - 35 U/L Final  . Bilirubin, Total 11/10/2014 0.4   Final  Abstract on  11/07/2014  Component Date Value Ref Range Status  . HM Dexa Scan 11/04/2014 [facility] Bone Density   Final     Assessment/Plan  1. Essential hypertension Controlled  2. Gastroesophageal reflux disease without esophagitis Controlled with Pepcid  3. Aortic ejection murmur Unchanged  4. Postmenopausal atrophic vaginitis Unchanged  5. Vaginal stenosis New diagnosis. Stenosis created by vaginal atrophy and possibly contributed to by previous surgery with the presence of a tissue band that is circumferential and narrows the vagina.  6. High cholesterol Controlled  7. Dysphagia Continue Pepcid  8. Left inguinal hernia Continue to observe  9. Edema Resolved  10. Depression Continue current medications. See Dr. Norma Fredrickson as planned.  11. PAOD (peripheral arterial occlusive disease) Asymptomatic. Continue to monitor.  12. Osteoporosis, senile We will get the results of the bone density test. I'm not sure she needs to continue with the [place]. Other alternatives might be Prolia..  13. Hyperglycemia Continue to monitor

## 2014-11-21 ENCOUNTER — Encounter: Payer: Self-pay | Admitting: Internal Medicine

## 2014-11-29 ENCOUNTER — Other Ambulatory Visit: Payer: Self-pay | Admitting: Internal Medicine

## 2014-12-02 DIAGNOSIS — F333 Major depressive disorder, recurrent, severe with psychotic symptoms: Secondary | ICD-10-CM | POA: Diagnosis not present

## 2014-12-03 ENCOUNTER — Encounter: Payer: Medicare Other | Admitting: Internal Medicine

## 2014-12-03 ENCOUNTER — Encounter: Payer: Self-pay | Admitting: Internal Medicine

## 2014-12-09 DIAGNOSIS — H26492 Other secondary cataract, left eye: Secondary | ICD-10-CM | POA: Diagnosis not present

## 2014-12-09 DIAGNOSIS — H04123 Dry eye syndrome of bilateral lacrimal glands: Secondary | ICD-10-CM | POA: Diagnosis not present

## 2014-12-09 DIAGNOSIS — Z961 Presence of intraocular lens: Secondary | ICD-10-CM | POA: Diagnosis not present

## 2014-12-09 DIAGNOSIS — H40053 Ocular hypertension, bilateral: Secondary | ICD-10-CM | POA: Diagnosis not present

## 2014-12-12 DIAGNOSIS — H26492 Other secondary cataract, left eye: Secondary | ICD-10-CM | POA: Diagnosis not present

## 2014-12-17 NOTE — Progress Notes (Signed)
This encounter was created in error - please disregard.

## 2015-01-21 DIAGNOSIS — F333 Major depressive disorder, recurrent, severe with psychotic symptoms: Secondary | ICD-10-CM | POA: Diagnosis not present

## 2015-01-27 ENCOUNTER — Telehealth: Payer: Self-pay | Admitting: *Deleted

## 2015-01-27 NOTE — Telephone Encounter (Signed)
Aleve twice daily is a reasonable choice initially.

## 2015-01-27 NOTE — Telephone Encounter (Signed)
Patient called and stated that she was having Neck Pain and Tylenol is not helping. She wants to know if she can take something else for the pain like Aleve or Advil. Please Advise.

## 2015-01-28 DIAGNOSIS — F333 Major depressive disorder, recurrent, severe with psychotic symptoms: Secondary | ICD-10-CM | POA: Diagnosis not present

## 2015-01-28 MED ORDER — NAPROXEN SODIUM 220 MG PO TABS: 220.0000 mg | ORAL_TABLET | Freq: Two times a day (BID) | ORAL | Status: DC

## 2015-01-28 NOTE — Telephone Encounter (Signed)
Patient notified and agreed.  

## 2015-01-29 ENCOUNTER — Ambulatory Visit (INDEPENDENT_AMBULATORY_CARE_PROVIDER_SITE_OTHER): Payer: Medicare Other | Admitting: Family Medicine

## 2015-01-29 ENCOUNTER — Ambulatory Visit (INDEPENDENT_AMBULATORY_CARE_PROVIDER_SITE_OTHER): Payer: Medicare Other

## 2015-01-29 VITALS — BP 138/68 | HR 97 | Temp 97.9°F | Resp 16 | Ht 62.0 in | Wt 154.4 lb

## 2015-01-29 DIAGNOSIS — T148XXA Other injury of unspecified body region, initial encounter: Secondary | ICD-10-CM

## 2015-01-29 DIAGNOSIS — M542 Cervicalgia: Secondary | ICD-10-CM | POA: Diagnosis not present

## 2015-01-29 MED ORDER — BACLOFEN 10 MG PO TABS: 5.0000 mg | ORAL_TABLET | Freq: Two times a day (BID) | ORAL | Status: DC | PRN

## 2015-01-29 NOTE — Telephone Encounter (Addendum)
Patient called back and stated that her neck pain is so much worse. She cant move her neck hardly.She stated she tried the Aleve with no relief.  I instructed her to go ahead and go to Urgent Care to be evaluated. She agreed and going this evening.

## 2015-01-29 NOTE — Patient Instructions (Signed)
Cervical Strain and Sprain (Whiplash) with Rehab Cervical strain and sprain are injuries that commonly occur with "whiplash" injuries. Whiplash occurs when the neck is forcefully whipped backward or forward, such as during a motor vehicle accident or during contact sports. The muscles, ligaments, tendons, discs, and nerves of the neck are susceptible to injury when this occurs. RISK FACTORS Risk of having a whiplash injury increases if:  Osteoarthritis of the spine.  Situations that make head or neck accidents or trauma more likely.  High-risk sports (football, rugby, wrestling, hockey, auto racing, gymnastics, diving, contact karate, or boxing).  Poor strength and flexibility of the neck.  Previous neck injury.  Poor tackling technique.  Improperly fitted or padded equipment. SYMPTOMS   Pain or stiffness in the front or back of neck or both.  Symptoms may present immediately or up to 24 hours after injury.  Dizziness, headache, nausea, and vomiting.  Muscle spasm with soreness and stiffness in the neck.  Tenderness and swelling at the injury site. PREVENTION  Learn and use proper technique (avoid tackling with the head, spearing, and head-butting; use proper falling techniques to avoid landing on the head).  Warm up and stretch properly before activity.  Maintain physical fitness:  Strength, flexibility, and endurance.  Cardiovascular fitness.  Wear properly fitted and padded protective equipment, such as padded soft collars, for participation in contact sports. PROGNOSIS  Recovery from cervical strain and sprain injuries is dependent on the extent of the injury. These injuries are usually curable in 1 week to 3 months with appropriate treatment.  RELATED COMPLICATIONS   Temporary numbness and weakness may occur if the nerve roots are damaged, and this may persist until the nerve has completely healed.  Chronic pain due to frequent recurrence of  symptoms.  Prolonged healing, especially if activity is resumed too soon (before complete recovery). TREATMENT  Treatment initially involves the use of ice and medication to help reduce pain and inflammation. It is also important to perform strengthening and stretching exercises and modify activities that worsen symptoms so the injury does not get worse. These exercises may be performed at home or with a therapist. For patients who experience severe symptoms, a soft, padded collar may be recommended to be worn around the neck.  Improving your posture may help reduce symptoms. Posture improvement includes pulling your chin and abdomen in while sitting or standing. If you are sitting, sit in a firm chair with your buttocks against the back of the chair. While sleeping, try replacing your pillow with a small towel rolled to 2 inches in diameter, or use a cervical pillow or soft cervical collar. Poor sleeping positions delay healing.  For patients with nerve root damage, which causes numbness or weakness, the use of a cervical traction apparatus may be recommended. Surgery is rarely necessary for these injuries. However, cervical strain and sprains that are present at birth (congenital) may require surgery. MEDICATION   If pain medication is necessary, nonsteroidal anti-inflammatory medications, such as aspirin and ibuprofen, or other minor pain relievers, such as acetaminophen, are often recommended.  Do not take pain medication for 7 days before surgery.  Prescription pain relievers may be given if deemed necessary by your caregiver. Use only as directed and only as much as you need. HEAT AND COLD:   Cold treatment (icing) relieves pain and reduces inflammation. Cold treatment should be applied for 10 to 15 minutes every 2 to 3 hours for inflammation and pain and immediately after any activity that aggravates   your symptoms. Use ice packs or an ice massage.  Heat treatment may be used prior to  performing the stretching and strengthening activities prescribed by your caregiver, physical therapist, or athletic trainer. Use a heat pack or a warm soak. SEEK MEDICAL CARE IF:   Symptoms get worse or do not improve in 2 weeks despite treatment.  New, unexplained symptoms develop (drugs used in treatment may produce side effects). EXERCISES RANGE OF MOTION (ROM) AND STRETCHING EXERCISES - Cervical Strain and Sprain These exercises may help you when beginning to rehabilitate your injury. In order to successfully resolve your symptoms, you must improve your posture. These exercises are designed to help reduce the forward-head and rounded-shoulder posture which contributes to this condition. Your symptoms may resolve with or without further involvement from your physician, physical therapist or athletic trainer. While completing these exercises, remember:   Restoring tissue flexibility helps normal motion to return to the joints. This allows healthier, less painful movement and activity.  An effective stretch should be held for at least 20 seconds, although you may need to begin with shorter hold times for comfort.  A stretch should never be painful. You should only feel a gentle lengthening or release in the stretched tissue. STRETCH- Axial Extensors  Lie on your back on the floor. You may bend your knees for comfort. Place a rolled-up hand towel or dish towel, about 2 inches in diameter, under the part of your head that makes contact with the floor.  Gently tuck your chin, as if trying to make a "double chin," until you feel a gentle stretch at the base of your head.  Hold __________ seconds. Repeat __________ times. Complete this exercise __________ times per day.  STRETCH - Axial Extension   Stand or sit on a firm surface. Assume a good posture: chest up, shoulders drawn back, abdominal muscles slightly tense, knees unlocked (if standing) and feet hip width apart.  Slowly retract your  chin so your head slides back and your chin slightly lowers. Continue to look straight ahead.  You should feel a gentle stretch in the back of your head. Be certain not to feel an aggressive stretch since this can cause headaches later.  Hold for __________ seconds. Repeat __________ times. Complete this exercise __________ times per day. STRETCH - Cervical Side Bend   Stand or sit on a firm surface. Assume a good posture: chest up, shoulders drawn back, abdominal muscles slightly tense, knees unlocked (if standing) and feet hip width apart.  Without letting your nose or shoulders move, slowly tip your right / left ear to your shoulder until your feel a gentle stretch in the muscles on the opposite side of your neck.  Hold __________ seconds. Repeat __________ times. Complete this exercise __________ times per day. STRETCH - Cervical Rotators   Stand or sit on a firm surface. Assume a good posture: chest up, shoulders drawn back, abdominal muscles slightly tense, knees unlocked (if standing) and feet hip width apart.  Keeping your eyes level with the ground, slowly turn your head until you feel a gentle stretch along the back and opposite side of your neck.  Hold __________ seconds. Repeat __________ times. Complete this exercise __________ times per day. RANGE OF MOTION - Neck Circles   Stand or sit on a firm surface. Assume a good posture: chest up, shoulders drawn back, abdominal muscles slightly tense, knees unlocked (if standing) and feet hip width apart.  Gently roll your head down and around from the   back of one shoulder to the back of the other. The motion should never be forced or painful.  Repeat the motion 10-20 times, or until you feel the neck muscles relax and loosen. Repeat __________ times. Complete the exercise __________ times per day. STRENGTHENING EXERCISES - Cervical Strain and Sprain These exercises may help you when beginning to rehabilitate your injury. They may  resolve your symptoms with or without further involvement from your physician, physical therapist, or athletic trainer. While completing these exercises, remember:   Muscles can gain both the endurance and the strength needed for everyday activities through controlled exercises.  Complete these exercises as instructed by your physician, physical therapist, or athletic trainer. Progress the resistance and repetitions only as guided.  You may experience muscle soreness or fatigue, but the pain or discomfort you are trying to eliminate should never worsen during these exercises. If this pain does worsen, stop and make certain you are following the directions exactly. If the pain is still present after adjustments, discontinue the exercise until you can discuss the trouble with your clinician. STRENGTH - Cervical Flexors, Isometric  Face a wall, standing about 6 inches away. Place a small pillow, a ball about 6-8 inches in diameter, or a folded towel between your forehead and the wall.  Slightly tuck your chin and gently push your forehead into the soft object. Push only with mild to moderate intensity, building up tension gradually. Keep your jaw and forehead relaxed.  Hold 10 to 20 seconds. Keep your breathing relaxed.  Release the tension slowly. Relax your neck muscles completely before you start the next repetition. Repeat __________ times. Complete this exercise __________ times per day. STRENGTH- Cervical Lateral Flexors, Isometric   Stand about 6 inches away from a wall. Place a small pillow, a ball about 6-8 inches in diameter, or a folded towel between the side of your head and the wall.  Slightly tuck your chin and gently tilt your head into the soft object. Push only with mild to moderate intensity, building up tension gradually. Keep your jaw and forehead relaxed.  Hold 10 to 20 seconds. Keep your breathing relaxed.  Release the tension slowly. Relax your neck muscles completely  before you start the next repetition. Repeat __________ times. Complete this exercise __________ times per day. STRENGTH - Cervical Extensors, Isometric   Stand about 6 inches away from a wall. Place a small pillow, a ball about 6-8 inches in diameter, or a folded towel between the back of your head and the wall.  Slightly tuck your chin and gently tilt your head back into the soft object. Push only with mild to moderate intensity, building up tension gradually. Keep your jaw and forehead relaxed.  Hold 10 to 20 seconds. Keep your breathing relaxed.  Release the tension slowly. Relax your neck muscles completely before you start the next repetition. Repeat __________ times. Complete this exercise __________ times per day. POSTURE AND BODY MECHANICS CONSIDERATIONS - Cervical Strain and Sprain Keeping correct posture when sitting, standing or completing your activities will reduce the stress put on different body tissues, allowing injured tissues a chance to heal and limiting painful experiences. The following are general guidelines for improved posture. Your physician or physical therapist will provide you with any instructions specific to your needs. While reading these guidelines, remember:  The exercises prescribed by your provider will help you have the flexibility and strength to maintain correct postures.  The correct posture provides the optimal environment for your joints to   work. All of your joints have less wear and tear when properly supported by a spine with good posture. This means you will experience a healthier, less painful body.  Correct posture must be practiced with all of your activities, especially prolonged sitting and standing. Correct posture is as important when doing repetitive low-stress activities (typing) as it is when doing a single heavy-load activity (lifting). PROLONGED STANDING WHILE SLIGHTLY LEANING FORWARD When completing a task that requires you to lean  forward while standing in one place for a long time, place either foot up on a stationary 2- to 4-inch high object to help maintain the best posture. When both feet are on the ground, the low back tends to lose its slight inward curve. If this curve flattens (or becomes too large), then the back and your other joints will experience too much stress, fatigue more quickly, and can cause pain.  RESTING POSITIONS Consider which positions are most painful for you when choosing a resting position. If you have pain with flexion-based activities (sitting, bending, stooping, squatting), choose a position that allows you to rest in a less flexed posture. You would want to avoid curling into a fetal position on your side. If your pain worsens with extension-based activities (prolonged standing, working overhead), avoid resting in an extended position such as sleeping on your stomach. Most people will find more comfort when they rest with their spine in a more neutral position, neither too rounded nor too arched. Lying on a non-sagging bed on your side with a pillow between your knees, or on your back with a pillow under your knees will often provide some relief. Keep in mind, being in any one position for a prolonged period of time, no matter how correct your posture, can still lead to stiffness. WALKING Walk with an upright posture. Your ears, shoulders, and hips should all line up. OFFICE WORK When working at a desk, create an environment that supports good, upright posture. Without extra support, muscles fatigue and lead to excessive strain on joints and other tissues. CHAIR:  A chair should be able to slide under your desk when your back makes contact with the back of the chair. This allows you to work closely.  The chair's height should allow your eyes to be level with the upper part of your monitor and your hands to be slightly lower than your elbows.  Body position:  Your feet should make contact with the  floor. If this is not possible, use a foot rest.  Keep your ears over your shoulders. This will reduce stress on your neck and low back. Document Released: 05/02/2005 Document Revised: 09/16/2013 Document Reviewed: 08/14/2008 [employer] Patient Information 2015 [employer]. This information is not intended to replace advice given to you by your health care provider. Make sure you discuss any questions you have with your health care provider.  

## 2015-01-29 NOTE — Progress Notes (Signed)
Chief Complaint:  Chief Complaint  Patient presents with  . Neck Pain    x 2 weeks     HPI: Tina Hampton is a 81 y.o. female who reports to [facility] today complaining of 2 week hx of neck pain, she has had this off an on, usually clears up, she has taken for meloxicam for this,  Dr Nyoka Cowden is her doctor, 7.5 mg dose. She takes meloxicam everyday, it radiated from one side to the next. She tried to get Dr Carlota Raspberry in the office bit bno openings. The nurse at [facility] wanted her to come here. Locate in the upper c spine. Has tried head and ice without relief. She has taken aleve twice a day. She was taking tylenol 2 500 mg extra stength which did nothing , this is in addition to the mobic. NKI. Has osteoporosis, OA. No recent events that may have triggered this. She lives in [facility] and is active. Denies HA, vision changes, nausea, vomiting, diarrhea, rashes or neck ridigidity   Past Medical History  Diagnosis Date  . High cholesterol   . Hypertension   . GERD (gastroesophageal reflux disease)   . Hiatal hernia   . Scoliosis   . DDD (degenerative disc disease)   . Depression   . Pain in joint, pelvic region and thigh 11/12/2011  . Insomnia, unspecified 10/18/2011  . Abnormal mammogram, unspecified 04/19/2011  . Chronic rhinitis 09/28/2010  . Palpitations 09/29/2009  . Chronic kidney disease, stage II (mild) 04/03/2009  . Nocturia 09/19/2008  . Other abnormal blood chemistry 04/03/2007  . Pain in joint, shoulder region 08/15/2006  . Tension headache 10/04/2005  . Pain in limb 08/26/2005  . Diverticulosis of colon (without mention of hemorrhage) 06/17/2005  . Dermatophytosis of nail 01/21/2005  . Unspecified constipation 01/21/2005  . Backache, unspecified 01/21/2005  . Postmenopausal atrophic vaginitis 12/14/2004  . Senile osteoporosis 12/14/2004  . Unspecified cataract 11/26/2004  . Edema 11/26/2004  . Unspecified urinary incontinence 11/26/2004  . Allergy   . Anxiety   . Osteoporosis,  senile 11/18/2014  . Vaginal stenosis 11/18/2014    Tight band about 1/2 inches into the vagina which will not allow passage of my index finger.   Marland Kitchen PAOD (peripheral arterial occlusive disease) 11/18/2014    Right leg; diminished popliteal, dorsalis pedis, and posterior tibial artery pulsations   . Hyperglycemia 11/18/2014   Past Surgical History  Procedure Laterality Date  . Tonsillectomy      Dr.Joe Tamala Julian  . Breast surgery      benign tumor removal, left breast 1969  . Cholecystectomy  1990    Dr.Moore   . Vaginal prolapse repair  2002    Dr.Horback   . Bilateral eyelid surgery  2005    Dr.Scott  . Cataract extraction      Right  . Hip pinning,cannulated  11/07/2011    Procedure: CANNULATED HIP PINNING;  Surgeon: Jessy Oto, MD;  Location: [facility];  Service: Orthopedics;  Laterality: Left;  . Abdominal hysterectomy  1969    Dr.Braun   . Bilateral oophorectomy  2002  . Orif femoral neck fracture w/ dhs  11/07/2011    Dr.Nitka   . Colonoscopy  3/18/20008    inflammation, diverticular associated colitis -Dr. Ardis Hughs  . Cosmetic surgery    . Eye surgery    . Tubal ligation    . Esophagogastroduodenoscopy N/A 01/24/2014    Procedure: ESOPHAGOGASTRODUODENOSCOPY (EGD);  Surgeon: Irene Shipper, MD;  Location:  [facility];  Service: Endoscopy;  Laterality: N/A;  . Foreign body removal N/A 01/24/2014    Procedure: FOREIGN BODY REMOVAL;  Surgeon: Irene Shipper, MD;  Location: [place];  Service: Endoscopy;  Laterality: N/A;  . Esophagogastroduodenoscopy endoscopy  04/04/14    Dr. Ardis Hughs   Social History   Social History  . Marital Status: Widowed    Spouse Name: N/A  . Number of Children: N/A  . Years of Education: N/A   Occupational History  . retired Producer, television/film/video    Social History Main Topics  . Smoking status: Never Smoker   . Smokeless tobacco: Never Used  . Alcohol Use: No  . Drug Use: No  . Sexual Activity: No   Other Topics Concern  . None   Social History  Narrative   Lives at [facility] since 04/14/2005   Widowed (husband expired 2014)   Has Living Will, POA, MOST   Exercise: water aerobic  5 times a week   Walks with walker   Never smoked   Drinks moderate amount of caffeinate beverages daily   Alcohol none         Family History  Problem Relation Age of Onset  . Heart disease Mother   . Stroke Father   . Heart disease Brother   . Esophageal cancer Neg Hx   . Stomach cancer Neg Hx   . Colon cancer Neg Hx    Allergies  Allergen Reactions  . Penicillins Other (See Comments)    CHILDHOOD REACTION   Prior to Admission medications   Medication Sig Start Date End Date Taking? Authorizing Provider  aspirin 81 MG tablet Take 81 mg by mouth daily.   Yes Historical Provider, MD  buPROPion (WELLBUTRIN XL) 150 MG 24 hr tablet Take 450 mg by mouth daily.   Yes Historical Provider, MD  Calcium Carbonate-Vitamin D (CALCIUM 600+D3 PO) Take by mouth 2 (two) times daily.   Yes Historical Provider, MD  chlorpheniramine-HYDROcodone Amanda Cockayne PENNKINETIC ER) 10-8 MG/5ML LQCR Take one tsp every 12 hours if needed to control  cough 08/12/14  Yes Estill Dooms, MD  cholecalciferol (VITAMIN D) 1000 UNITS tablet Take 1,000 Units by mouth daily.   Yes Historical Provider, MD  clonazePAM (KLONOPIN) 0.5 MG tablet Take 0.5 mg by mouth. Take one tablet twice daily  for anxiety as needed for anxiety 11/10/11  Yes Nita Sells, MD  conjugated estrogens (PREMARIN) vaginal cream Place 0.5 Applicatorfuls vaginally See admin instructions. Pt uses twice weekly. No certain days 11/22/60  Yes Mahima Pandey, MD  Glucosamine HCl 1000 MG TABS Take 2,000 mg by mouth 2 (two) times daily.   Yes Historical Provider, MD  ibandronate (BONIVA) 150 MG tablet Take 1 tablet (150 mg total) by mouth every 30 (thirty) days. Take in the morning with a full glass of water, on an empty stomach, and do not take anything else by mouth or lie down for the next 30 min. 07/21/14   Yes Estill Dooms, MD  meloxicam [facility]) 7.5 MG tablet One daily to help arthritis 03/25/14  Yes Estill Dooms, MD  Multiple Vitamin (MULTIVITAMIN WITH MINERALS) TABS Take 1 tablet by mouth daily.   Yes Historical Provider, MD  naproxen sodium (ALEVE) 220 MG tablet Take 1 tablet (220 mg total) by mouth 2 (two) times daily with a meal. 01/28/15  Yes Estill Dooms, MD  NIFEdipine (PROCARDIA XL/ADALAT-CC) 60 MG 24 hr tablet TAKE 1 TABLET BY MOUTH EVERY DAY 12/01/14  Yes Tiffany L Reed, DO  pantoprazole (PROTONIX) 40 MG tablet Take one tablet by mouth once daily for stomach 04/23/14  Yes Estill Dooms, MD  Polyethyl Glycol-Propyl Glycol (SYSTANE) 0.4-0.3 % SOLN Place 1 drop into both eyes 2 (two) times daily.    Yes Historical Provider, MD  polyethylene glycol powder (GLYCOLAX/MIRALAX) powder Take 17 g by mouth 2 (two) times daily as needed. 01/28/14  Yes Estill Dooms, MD  pravastatin (PRAVACHOL) 20 MG tablet Take one tablet by mouth once daily for cholesterol 03/10/14  Yes Tiffany L Reed, DO  Probiotic Product (PROBIOTIC PO) Take 1 tablet by mouth daily.   Yes Historical Provider, MD  venlafaxine XR (EFFEXOR-XR) 150 MG 24 hr capsule Take 150 mg by mouth every morning. Take one capsule every morning for depression 07/29/14  Yes Historical Provider, MD     ROS: The patient denies fevers, chills, night sweats, unintentional weight loss, chest pain, palpitations, wheezing, dyspnea on exertion, nausea, vomiting, abdominal pain, dysuria, hematuria, melena  All other systems have been reviewed and were otherwise negative with the exception of those mentioned in the HPI and as above.    PHYSICAL EXAM: Filed Vitals:   01/29/15 1742  BP: 138/68  Pulse: 97  Temp: 97.9 F (36.6 C)  Resp: 16   Body mass index is 28.23 kg/(m^2).   General: Alert, no acute distress HEENT:  Normocephalic, atraumatic, oropharynx patent. EOMI, PERRLA Cardiovascular:  Regular rate and rhythm, no rubs ; + systolic murmurs.  No Carotid bruits, radial pulse intact. No pedal edema.  Respiratory: Clear to auscultation bilaterally.  No wheezes, rales, or rhonchi.  No cyanosis, no use of accessory musculature Abdominal: No organomegaly, abdomen is soft and non-tender, positive bowel sounds. No masses. Skin: No rashes. Neurologic: Facial musculature symmetric. Psychiatric: Patient acts appropriately throughout our interaction. Lymphatic: No cervical or submandibular lymphadenopathy Musculoskeletal: Gait uses walker. No edema, tenderness Neck exam Neg spurling + paramsk tenderness  Full ROM 5/5 strength        LABS: Results for orders placed or performed in visit on 03/01/50  Basic metabolic panel  Result Value Ref Range   Glucose 109 mg/dL   BUN 16 4 - 21 mg/dL   Creatinine 0.9 0.5 - 1.1 mg/dL   Potassium 4.6 3.4 - 5.3 mmol/L   Sodium 142 137 - 147 mmol/L  Lipid panel  Result Value Ref Range   Triglycerides 90 40 - 160 mg/dL   Cholesterol 163 0 - 200 mg/dL   HDL 59 35 - 70 mg/dL   LDL Cholesterol 86 mg/dL  Hepatic function panel  Result Value Ref Range   Alkaline Phosphatase 58 25 - 125 U/L   ALT 15 7 - 35 U/L   AST 22 13 - 35 U/L   Bilirubin, Total 0.4 mg/dL     EKG/XRAY:   Primary read interpreted by Dr. Marin Comment at [facility]. + DJD , neg fracture or dislocation   ASSESSMENT/PLAN: Encounter Diagnoses  Name Primary?  . Neck pain   . Sprain and strain Yes   Most likely just DJD related, Xray does not show anything acute Advise to do ROM exercises Rx low dose baclofen She has a fu appt with Dr Carlota Raspberry next week   Gross sideeffects, risk and benefits, and alternatives of medications d/w patient. Patient is aware that all medications have potential sideeffects and we are unable to predict every sideeffect or drug-drug interaction that may occur.  Thao Le DO  01/29/2015 8:07 PM

## 2015-02-03 ENCOUNTER — Non-Acute Institutional Stay: Payer: Medicare Other | Admitting: Internal Medicine

## 2015-02-03 ENCOUNTER — Encounter: Payer: Self-pay | Admitting: Internal Medicine

## 2015-02-03 VITALS — BP 138/80 | HR 94 | Temp 97.7°F | Resp 18 | Ht 62.0 in | Wt 154.6 lb

## 2015-02-03 DIAGNOSIS — I779 Disorder of arteries and arterioles, unspecified: Secondary | ICD-10-CM

## 2015-02-03 DIAGNOSIS — M542 Cervicalgia: Secondary | ICD-10-CM | POA: Diagnosis not present

## 2015-02-03 DIAGNOSIS — R1032 Left lower quadrant pain: Secondary | ICD-10-CM

## 2015-02-03 DIAGNOSIS — K4090 Unilateral inguinal hernia, without obstruction or gangrene, not specified as recurrent: Secondary | ICD-10-CM

## 2015-02-03 DIAGNOSIS — I10 Essential (primary) hypertension: Secondary | ICD-10-CM | POA: Diagnosis not present

## 2015-02-03 NOTE — Progress Notes (Signed)
Patient ID: Tina Hampton, female   DOB: 13-Apr-1926, 81 y.o.   MRN: [id number]    [facility]     Place of Service: Clinic (12)     Allergies  Allergen Reactions  . Penicillins Other (See Comments)    CHILDHOOD REACTION    Chief Complaint  Patient presents with  . Medical Management of Chronic Issues  . Acute Visit    pain in neck x 1wk, went to urgent care, x-ray showed arthritis    HPI:  Patient has several concerns on presentation today.  Her primary concern is a continuation of neck pain. He was previously taking Aleve for this problem, but it stopped helping she then went to the urgent care on [address] She had x-rays of her neck that showed severe osteoarthritis. The clinician gave her prescriptions for baclofen and meloxicam 7.5 mg. She has improved on these medications plus neck exercises. Cold packs also seem to help away her neck feels. She wonders if there is more that she can do be helpful in resolving this issue.  She has been given a recommendation that she contact [place] for a compounded medicine containing the following ingredients: Baclofen 2%, diclofenac 5%, gabapentin 6%, tetracaine 3%, ketamine 10%. Listed phone numbers include [phone], [phone], and [phone].  She expresses discomfort in the left lower quadrant. She had a friend recently diagnosed with anorectal cancer. She worries about herself and realizes that she has some tendency to hypotension 5. In 04/20/2014 she had a CT of the abdomen that did show a left inguinal hernia and diverticulosis. There are no other significant abnormalities and no evidence of diverticulitis. He has not passed any blood in the stool. Discomfort in the left lower quadrant comes and goes and does not seem to be particularly related to foods, defecation, or walking.  Patient has a history of chronic depression and feels that she has gotten a little worse. She is on appropriate medications  and has had an appointment with her therapist recently.  The last concern is with a drainage in the back of her throat which is chronic and usually slightly yellow in color, but recently changed to a greenish color. She has not run any fever. There has been no sore throat, headache, or increase in cough.  Hypertension is under good control.  Medications: Patient's Medications  New Prescriptions   No medications on file  Previous Medications   ASPIRIN 81 MG TABLET    Take 81 mg by mouth daily.   BACLOFEN (LIORESAL) 10 MG TABLET    Take 0.5 tablets (5 mg total) by mouth 2 (two) times daily as needed for muscle spasms.   BUPROPION (WELLBUTRIN XL) 150 MG 24 HR TABLET    Take 450 mg by mouth daily.   CALCIUM CARBONATE-VITAMIN D (CALCIUM 600+D3 PO)    Take by mouth 2 (two) times daily.   CHLORPHENIRAMINE-HYDROCODONE (TUSSIONEX PENNKINETIC ER) 10-8 MG/5ML LQCR    Take one tsp every 12 hours if needed to control  cough   CHOLECALCIFEROL (VITAMIN D) 1000 UNITS TABLET    Take 1,000 Units by mouth daily.   CLONAZEPAM (KLONOPIN) 0.5 MG TABLET    Take 0.5 mg by mouth. Take one tablet twice daily  for anxiety as needed for anxiety   CONJUGATED ESTROGENS (PREMARIN) VAGINAL CREAM    Place 0.5 Applicatorfuls vaginally See admin instructions. Pt uses twice weekly. No certain days   GLUCOSAMINE HCL 1000 MG TABS    Take 2,000 mg by  mouth 2 (two) times daily.   IBANDRONATE (BONIVA) 150 MG TABLET    Take 1 tablet (150 mg total) by mouth every 30 (thirty) days. Take in the morning with a full glass of water, on an empty stomach, and do not take anything else by mouth or lie down for the next 30 min.   MELOXICAM (MOBIC) 7.5 MG TABLET    One daily to help arthritis   MULTIPLE VITAMIN (MULTIVITAMIN WITH MINERALS) TABS    Take 1 tablet by mouth daily.   NAPROXEN SODIUM (ALEVE) 220 MG TABLET    Take 1 tablet (220 mg total) by mouth 2 (two) times daily with a meal.   NIFEDIPINE (PROCARDIA XL/ADALAT-CC) 60 MG 24 HR TABLET     TAKE 1 TABLET BY MOUTH EVERY DAY   PANTOPRAZOLE (PROTONIX) 40 MG TABLET    Take one tablet by mouth once daily for stomach   POLYETHYL GLYCOL-PROPYL GLYCOL (SYSTANE) 0.4-0.3 % SOLN    Place 1 drop into both eyes 2 (two) times daily.    POLYETHYLENE GLYCOL POWDER (GLYCOLAX/MIRALAX) POWDER    Take 17 g by mouth 2 (two) times daily as needed.   PRAVASTATIN (PRAVACHOL) 20 MG TABLET    Take one tablet by mouth once daily for cholesterol   PROBIOTIC PRODUCT (PROBIOTIC PO)    Take 1 tablet by mouth daily.   VENLAFAXINE XR (EFFEXOR-XR) 150 MG 24 HR CAPSULE    Take 150 mg by mouth every morning. Take one capsule every morning for depression  Modified Medications   No medications on file  Discontinued Medications   No medications on file     Review of Systems  Constitutional: Negative.  Negative for unexpected weight change.  HENT: Positive for hearing loss. Negative for ear pain.   Eyes: Positive for visual disturbance (corrective lenses).  Respiratory: Positive for cough.   Cardiovascular:       Aortic ejection murmur noted 04/20/14.  Gastrointestinal: Positive for abdominal pain (Left lower quadrant).       Heartburn. Constipation. History of hiatal hernia with 1/2 stomach in the chest. Impaction of meat in the esophagus 01/24/14. EGD with dilation of esophageal stricture by Dr. Ardis Hughs on 04/04/14.  Left inguinal hernia on 04/20/14.  Endocrine: Negative for polydipsia, polyphagia and polyuria.       History of mild hyperglycemia in September 2015 and June 2016.  Genitourinary: Negative for hematuria.       Nocturia x5-6. Increased frequency in the daytime. Urge incontinence. Hx urinary tract infection. Vaginal atrophy and stenosis  Musculoskeletal: Positive for neck pain and neck stiffness.       Chronic mild back discomfort. Some joint pains. No joint swelling, muscular pain, or muscular weakness.  Skin: Negative for rash.  Allergic/Immunologic: Negative.   Neurological:       11/18/14  MMSE 30/30. Passed clock drawing.  Hematological: Negative.   Psychiatric/Behavioral: Positive for sleep disturbance and dysphoric mood. The patient is nervous/anxious.        Insomnia.Danley Danker Vitals:   02/03/15 1003  BP: 138/80  Pulse: 94  Temp: 97.7 F (36.5 C)  TempSrc: Oral  Resp: 18  Height: 5' 2"  (1.575 m)  Weight: 154 lb 9.6 oz (70.126 kg)  SpO2: 95%   Body mass index is 28.27 kg/(m^2).  Physical Exam  Constitutional: She is oriented to person, place, and time. She appears well-developed and well-nourished. No distress.  HENT:  Head: Normocephalic.  Right Ear: External ear normal.  Left Ear:  External ear normal.  Nose: Nose normal.  Mouth/Throat: Oropharynx is clear and moist.   Mild hearing loss bilaterally.  Eyes:  Wears corrective lenses. Left cataract present. Right pupil seems to be nonreactive to light. There are 2 white cystic areas on the left upper eyelid medially. 2 additional areas are present on the left lower lid centrally.  Neck: Neck supple. No JVD present. No tracheal deviation present. No thyromegaly present.  Cardiovascular: Normal rate and regular rhythm.  Exam reveals no gallop and no friction rub.   No murmur heard. 3/6 aortic ejection murmur with radiation towards left sternal border as well.  Diminished pulsation at right popliteal, right posterior tibial, and right dorsalis pedis.  Pulmonary/Chest: Breath sounds normal. No respiratory distress. She has no wheezes. She has no rales. She exhibits no tenderness.  Abdominal: She exhibits no distension and no mass. There is no tenderness.  Not tender in the left groin or left lower quadrant. Small bulge in the left groin.  Genitourinary: Guaiac negative stool. No vaginal discharge found.  Approximately 1-1/2 inches into the vagina, there is a thin tissue band at is circumferential and narrows the vagina. I'm unable to penetrate through the band with my index finger. There is no tenderness,  bleeding, or other excessive tissue growth in this area.  Musculoskeletal: She exhibits edema.  Flat feet with collapsed arches. Unstable gait. Using cane.  Neurological: She is alert and oriented to person, place, and time. She has normal reflexes. No cranial nerve deficit. Coordination normal.  Skin: No rash noted. No erythema. No pallor.  Multiple seborrheic keratoses.  Psychiatric: She has a normal mood and affect. Her behavior is normal. Judgment and thought content normal.     Labs reviewed: Lab Summary Latest Ref Rng 11/10/2014 04/20/2014 01/25/2014 01/24/2014  Hemoglobin 12.2 - 16.2 g/dL (None) 13.1 12.4 12.5  Hematocrit 37.7 - 47.9 % (None) 40.9 38.9 38.0  White count 4.6 - 10.2 K/uL (None) 7.8 8.0 6.9  Platelet count 150 - 400 K/uL (None) (None) 229 244  Sodium 137 - 147 mmol/L 142 (None) 142 141  Potassium 3.4 - 5.3 mmol/L 4.6 (None) 3.9 3.6(L)  Calcium 8.4 - 10.5 mg/dL (None) (None) 8.9 9.0  Phosphorus - (None) (None) (None) (None)  Creatinine 0.5 - 1.1 mg/dL 0.9 1.00 0.82 0.88  AST 13 - 35 U/L 22 (None) (None) 29  Alk Phos 25 - 125 U/L 58 (None) (None) 61  Bilirubin 0.3 - 1.2 mg/dL (None) (None) (None) 0.4  Glucose - 109 (None) 116(H) 111(H)  Cholesterol 0 - 200 mg/dL 163 (None) (None) (None)  HDL cholesterol 35 - 70 mg/dL 59 (None) (None) (None)  Triglycerides 40 - 160 mg/dL 90 (None) (None) (None)  LDL Direct - (None) (None) (None) (None)  LDL Calc - 86 (None) (None) (None)  Total protein 6.0 - 8.3 g/dL (None) (None) (None) 6.7  Albumin 3.5 - 5.2 g/dL (None) (None) (None) 3.5   No results found for: TSH, T3TOTAL, T4TOTAL, THYROIDAB Lab Results  Component Value Date   BUN 16 11/10/2014   No results found for: HGBA1C     Assessment/Plan  1. Cervicalgia -Continue with baclofen 10 mg half tablet twice daily plus meloxicam 7.5 mg daily. -Referred to physical therapy at [facility] for treatment of chronic neck discomfort  2. Pain, abdominal, LLQ Possibly  related to previous documented diverticulosis. Patient does not want further testing at this time, she just wanted reassurance.  3. Left inguinal hernia Small and asymptomatic  4. Essential hypertension Controlled

## 2015-02-10 DIAGNOSIS — F333 Major depressive disorder, recurrent, severe with psychotic symptoms: Secondary | ICD-10-CM | POA: Diagnosis not present

## 2015-02-12 DIAGNOSIS — Z23 Encounter for immunization: Secondary | ICD-10-CM | POA: Diagnosis not present

## 2015-02-25 DIAGNOSIS — M542 Cervicalgia: Secondary | ICD-10-CM | POA: Diagnosis not present

## 2015-02-25 DIAGNOSIS — R293 Abnormal posture: Secondary | ICD-10-CM | POA: Diagnosis not present

## 2015-02-26 DIAGNOSIS — M542 Cervicalgia: Secondary | ICD-10-CM | POA: Diagnosis not present

## 2015-02-26 DIAGNOSIS — R293 Abnormal posture: Secondary | ICD-10-CM | POA: Diagnosis not present

## 2015-03-02 DIAGNOSIS — R293 Abnormal posture: Secondary | ICD-10-CM | POA: Diagnosis not present

## 2015-03-02 DIAGNOSIS — M542 Cervicalgia: Secondary | ICD-10-CM | POA: Diagnosis not present

## 2015-03-04 DIAGNOSIS — M542 Cervicalgia: Secondary | ICD-10-CM | POA: Diagnosis not present

## 2015-03-04 DIAGNOSIS — R293 Abnormal posture: Secondary | ICD-10-CM | POA: Diagnosis not present

## 2015-03-06 DIAGNOSIS — M542 Cervicalgia: Secondary | ICD-10-CM | POA: Diagnosis not present

## 2015-03-06 DIAGNOSIS — R293 Abnormal posture: Secondary | ICD-10-CM | POA: Diagnosis not present

## 2015-03-09 DIAGNOSIS — M542 Cervicalgia: Secondary | ICD-10-CM | POA: Diagnosis not present

## 2015-03-09 DIAGNOSIS — R293 Abnormal posture: Secondary | ICD-10-CM | POA: Diagnosis not present

## 2015-03-11 DIAGNOSIS — R293 Abnormal posture: Secondary | ICD-10-CM | POA: Diagnosis not present

## 2015-03-11 DIAGNOSIS — M542 Cervicalgia: Secondary | ICD-10-CM | POA: Diagnosis not present

## 2015-03-13 DIAGNOSIS — M542 Cervicalgia: Secondary | ICD-10-CM | POA: Diagnosis not present

## 2015-03-13 DIAGNOSIS — R293 Abnormal posture: Secondary | ICD-10-CM | POA: Diagnosis not present

## 2015-03-16 DIAGNOSIS — M542 Cervicalgia: Secondary | ICD-10-CM | POA: Diagnosis not present

## 2015-03-16 DIAGNOSIS — R293 Abnormal posture: Secondary | ICD-10-CM | POA: Diagnosis not present

## 2015-03-18 DIAGNOSIS — M542 Cervicalgia: Secondary | ICD-10-CM | POA: Diagnosis not present

## 2015-03-18 DIAGNOSIS — R293 Abnormal posture: Secondary | ICD-10-CM | POA: Diagnosis not present

## 2015-03-20 DIAGNOSIS — R293 Abnormal posture: Secondary | ICD-10-CM | POA: Diagnosis not present

## 2015-03-20 DIAGNOSIS — M542 Cervicalgia: Secondary | ICD-10-CM | POA: Diagnosis not present

## 2015-03-23 DIAGNOSIS — R293 Abnormal posture: Secondary | ICD-10-CM | POA: Diagnosis not present

## 2015-03-23 DIAGNOSIS — M542 Cervicalgia: Secondary | ICD-10-CM | POA: Diagnosis not present

## 2015-03-25 DIAGNOSIS — M542 Cervicalgia: Secondary | ICD-10-CM | POA: Diagnosis not present

## 2015-03-25 DIAGNOSIS — R293 Abnormal posture: Secondary | ICD-10-CM | POA: Diagnosis not present

## 2015-03-27 DIAGNOSIS — R293 Abnormal posture: Secondary | ICD-10-CM | POA: Diagnosis not present

## 2015-03-27 DIAGNOSIS — M542 Cervicalgia: Secondary | ICD-10-CM | POA: Diagnosis not present

## 2015-03-31 DIAGNOSIS — M47812 Spondylosis without myelopathy or radiculopathy, cervical region: Secondary | ICD-10-CM | POA: Diagnosis not present

## 2015-04-16 ENCOUNTER — Encounter: Payer: Self-pay | Admitting: Internal Medicine

## 2015-05-21 DIAGNOSIS — I10 Essential (primary) hypertension: Secondary | ICD-10-CM | POA: Diagnosis not present

## 2015-05-21 DIAGNOSIS — E039 Hypothyroidism, unspecified: Secondary | ICD-10-CM | POA: Diagnosis not present

## 2015-05-21 LAB — LIPID PANEL
Cholesterol: 159 mg/dL (ref 0–200)
HDL: 59 mg/dL (ref 35–70)
LDL CALC: 82 mg/dL
TRIGLYCERIDES: 89 mg/dL (ref 40–160)

## 2015-05-21 LAB — BASIC METABOLIC PANEL
BUN: 15 mg/dL (ref 4–21)
Creatinine: 0.9 mg/dL (ref 0.5–1.1)
Glucose: 91 mg/dL
Potassium: 4.8 mmol/L (ref 3.4–5.3)
Sodium: 140 mmol/L (ref 137–147)

## 2015-05-21 LAB — HEPATIC FUNCTION PANEL
ALK PHOS: 60 U/L (ref 25–125)
ALT: 19 U/L (ref 7–35)
AST: 25 U/L (ref 13–35)
Bilirubin, Total: 0.3 mg/dL

## 2015-05-21 LAB — TSH: TSH: 2.9 u[IU]/mL (ref 0.41–5.90)

## 2015-05-22 ENCOUNTER — Encounter: Payer: Self-pay | Admitting: *Deleted

## 2015-05-26 ENCOUNTER — Encounter: Payer: Self-pay | Admitting: Internal Medicine

## 2015-06-02 ENCOUNTER — Encounter: Payer: Self-pay | Admitting: Internal Medicine

## 2015-06-02 ENCOUNTER — Non-Acute Institutional Stay: Payer: Medicare Other | Admitting: Internal Medicine

## 2015-06-02 VITALS — BP 120/62 | HR 95 | Temp 97.4°F | Resp 20 | Ht 62.0 in | Wt 156.6 lb

## 2015-06-02 DIAGNOSIS — M25552 Pain in left hip: Secondary | ICD-10-CM

## 2015-06-02 DIAGNOSIS — M542 Cervicalgia: Secondary | ICD-10-CM | POA: Diagnosis not present

## 2015-06-02 DIAGNOSIS — R251 Tremor, unspecified: Secondary | ICD-10-CM | POA: Diagnosis not present

## 2015-06-02 DIAGNOSIS — F329 Major depressive disorder, single episode, unspecified: Secondary | ICD-10-CM

## 2015-06-02 DIAGNOSIS — I10 Essential (primary) hypertension: Secondary | ICD-10-CM

## 2015-06-02 DIAGNOSIS — K219 Gastro-esophageal reflux disease without esophagitis: Secondary | ICD-10-CM

## 2015-06-02 DIAGNOSIS — F32A Depression, unspecified: Secondary | ICD-10-CM

## 2015-06-02 DIAGNOSIS — M25559 Pain in unspecified hip: Secondary | ICD-10-CM | POA: Insufficient documentation

## 2015-06-02 DIAGNOSIS — N952 Postmenopausal atrophic vaginitis: Secondary | ICD-10-CM | POA: Diagnosis not present

## 2015-06-02 NOTE — Progress Notes (Signed)
Patient ID: Tina Hampton, female   DOB: 1925/07/31, 82 y.o.   MRN: [id number]    [facility]     Place of Service: Clinic (12)     Allergies  Allergen Reactions  . Penicillins Other (See Comments)    CHILDHOOD REACTION    Chief Complaint  Patient presents with  . Medical Management of Chronic Issues    6 mo f/u , pain in left hip, and back    HPI:  Increased Left hip pain. Seeing Dr. Louanne Skye 06/18/15. Pain in the low back and the lateral left hip. Sitting still improves the pain. Has some sharp pains. Using BenGay.   Neck is better. Dr. Louanne Skye said to keep her TV at eye level.  Medications also helped. Muscle relaxer, cyclobenzaprine., does not interact with her other medications.  Had been using Premarin cream. She stopped it.   Has started Vitamin C.  Subtle increaes in the tremor of the right hand.  Medications: Patient's Medications  New Prescriptions   No medications on file  Previous Medications   ASPIRIN 81 MG TABLET    Take 81 mg by mouth daily.   BACLOFEN (LIORESAL) 10 MG TABLET    Take 0.5 tablets (5 mg total) by mouth 2 (two) times daily as needed for muscle spasms.   BUPROPION (WELLBUTRIN XL) 150 MG 24 HR TABLET    Take 450 mg by mouth daily.   CALCIUM CARBONATE-VITAMIN D (CALCIUM 600+D3 PO)    Take by mouth 2 (two) times daily.   CHLORPHENIRAMINE-HYDROCODONE (TUSSIONEX PENNKINETIC ER) 10-8 MG/5ML LQCR    Take one tsp every 12 hours if needed to control  cough   CHOLECALCIFEROL (VITAMIN D) 1000 UNITS TABLET    Take 1,000 Units by mouth daily.   CLONAZEPAM (KLONOPIN) 0.5 MG TABLET    Take 0.5 mg by mouth. Take one tablet twice daily  for anxiety as needed for anxiety   CONJUGATED ESTROGENS (PREMARIN) VAGINAL CREAM    Place 0.5 Applicatorfuls vaginally See admin instructions. Pt uses twice weekly. No certain days   GLUCOSAMINE HCL 1000 MG TABS    Take 2,000 mg by mouth 2 (two) times daily.   IBANDRONATE (BONIVA) 150 MG TABLET    Take 1 tablet (150  mg total) by mouth every 30 (thirty) days. Take in the morning with a full glass of water, on an empty stomach, and do not take anything else by mouth or lie down for the next 30 min.   MELOXICAM (MOBIC) 7.5 MG TABLET    One daily to help arthritis   MULTIPLE VITAMIN (MULTIVITAMIN WITH MINERALS) TABS    Take 1 tablet by mouth daily.   NAPROXEN SODIUM (ALEVE) 220 MG TABLET    Take 1 tablet (220 mg total) by mouth 2 (two) times daily with a meal.   NIFEDIPINE (PROCARDIA XL/ADALAT-CC) 60 MG 24 HR TABLET    TAKE 1 TABLET BY MOUTH EVERY DAY   PANTOPRAZOLE (PROTONIX) 40 MG TABLET    Take one tablet by mouth once daily for stomach   POLYETHYL GLYCOL-PROPYL GLYCOL (SYSTANE) 0.4-0.3 % SOLN    Place 1 drop into both eyes 2 (two) times daily.    POLYETHYLENE GLYCOL POWDER (GLYCOLAX/MIRALAX) POWDER    Take 17 g by mouth 2 (two) times daily as needed.   PRAVASTATIN (PRAVACHOL) 20 MG TABLET    Take one tablet by mouth once daily for cholesterol   PROBIOTIC PRODUCT (PROBIOTIC PO)    Take 1 tablet by mouth daily.  VENLAFAXINE XR (EFFEXOR-XR) 150 MG 24 HR CAPSULE    Take 150 mg by mouth every morning.  Modified Medications   No medications on file  Discontinued Medications   VENLAFAXINE XR (EFFEXOR-XR) 150 MG 24 HR CAPSULE    Take 150 mg by mouth every morning. Take one capsule every morning for depression     Review of Systems  Constitutional: Negative.  Negative for unexpected weight change.  HENT: Positive for hearing loss. Negative for ear pain.   Eyes: Positive for visual disturbance (corrective lenses).  Respiratory: Positive for cough.   Cardiovascular:       Aortic ejection murmur noted 04/20/14.  Gastrointestinal: Positive for abdominal pain (Left lower quadrant).       Heartburn. Constipation. History of hiatal hernia with 1/2 stomach in the chest. Impaction of meat in the esophagus 01/24/14. EGD with dilation of esophageal stricture by Dr. Ardis Hughs on 04/04/14.  Left inguinal hernia on 04/20/14.    Endocrine: Negative for polydipsia, polyphagia and polyuria.       History of mild hyperglycemia in September 2015 and June 2016.  Genitourinary: Negative for hematuria.       Nocturia x5-6. Increased frequency in the daytime. Urge incontinence. Hx urinary tract infection. Vaginal atrophy and stenosis  Musculoskeletal: Positive for neck pain and neck stiffness.       Chronic mild back discomfort. Some joint pains. No joint swelling, muscular pain, or muscular weakness.  Skin: Negative for rash.  Allergic/Immunologic: Negative.   Neurological:       11/18/14 MMSE 30/30. Passed clock drawing.  Hematological: Negative.   Psychiatric/Behavioral: Positive for sleep disturbance and dysphoric mood. The patient is nervous/anxious.        Insomnia.Danley Danker Vitals:   06/02/15 1100  BP: 120/62  Pulse: 95  Temp: 97.4 F (36.3 C)  TempSrc: Oral  Resp: 20  Height: 5' 2"  (1.575 m)  Weight: 156 lb 9.6 oz (71.033 kg)  SpO2: 95%   Wt Readings from Last 3 Encounters:  06/02/15 156 lb 9.6 oz (71.033 kg)  02/03/15 154 lb 9.6 oz (70.126 kg)  01/29/15 154 lb 6.4 oz (70.035 kg)    Body mass index is 28.64 kg/(m^2).  Physical Exam  Constitutional: She is oriented to person, place, and time. She appears well-developed and well-nourished. No distress.  HENT:  Head: Normocephalic.  Right Ear: External ear normal.  Left Ear: External ear normal.  Nose: Nose normal.  Mouth/Throat: Oropharynx is clear and moist.   Mild hearing loss bilaterally.  Eyes:  Wears corrective lenses. Left cataract present. Right pupil seems to be nonreactive to light. There are 2 white cystic areas on the left upper eyelid medially. 2 additional areas are present on the left lower lid centrally.  Neck: Neck supple. No JVD present. No tracheal deviation present. No thyromegaly present.  Cardiovascular: Normal rate and regular rhythm.  Exam reveals no gallop and no friction rub.   No murmur heard. 3/6 aortic ejection  murmur with radiation towards left sternal border as well.  Diminished pulsation at right popliteal, right posterior tibial, and right dorsalis pedis.  Pulmonary/Chest: Breath sounds normal. No respiratory distress. She has no wheezes. She has no rales. She exhibits no tenderness.  Abdominal: She exhibits no distension and no mass. There is no tenderness.  Not tender in the left groin or left lower quadrant. Small bulge in the left groin.  Genitourinary: Guaiac negative stool. No vaginal discharge found.  Approximately 1-1/2 inches into the vagina,  there is a thin tissue band at is circumferential and narrows the vagina. I'm unable to penetrate through the band with my index finger. There is no tenderness, bleeding, or other excessive tissue growth in this area.  Musculoskeletal: She exhibits edema.  Flat feet with collapsed arches. Unstable gait. Using cane.  Neurological: She is alert and oriented to person, place, and time. She has normal reflexes. No cranial nerve deficit. Coordination normal.  Skin: No rash noted. No erythema. No pallor.  Multiple seborrheic keratoses.  Psychiatric: She has a normal mood and affect. Her behavior is normal. Judgment and thought content normal.     Labs reviewed: Lab Summary Latest Ref Rng 05/21/2015 11/10/2014 04/20/2014 01/25/2014 01/24/2014  Hemoglobin 12.2 - 16.2 g/dL (None) (None) 13.1 12.4 12.5  Hematocrit 37.7 - 47.9 % (None) (None) 40.9 38.9 38.0  White count 4.6 - 10.2 K/uL (None) (None) 7.8 8.0 6.9  Platelet count 150 - 400 K/uL (None) (None) (None) 229 244  Sodium 137 - 147 mmol/L 140 142 (None) 142 141  Potassium 3.4 - 5.3 mmol/L 4.8 4.6 (None) 3.9 3.6(L)  Calcium 8.4 - 10.5 mg/dL (None) (None) (None) 8.9 9.0  Phosphorus - (None) (None) (None) (None) (None)  Creatinine 0.5 - 1.1 mg/dL 0.9 0.9 1.00 0.82 0.88  AST 13 - 35 U/L 25 22 (None) (None) 29  Alk Phos 25 - 125 U/L 60 58 (None) (None) 61  Bilirubin 0.3 - 1.2 mg/dL (None) (None) (None)  (None) 0.4  Glucose - 91 109 (None) 116(H) 111(H)  Cholesterol 0 - 200 mg/dL 159 163 (None) (None) (None)  HDL cholesterol 35 - 70 mg/dL 59 59 (None) (None) (None)  Triglycerides 40 - 160 mg/dL 89 90 (None) (None) (None)  LDL Direct - (None) (None) (None) (None) (None)  LDL Calc - 82 86 (None) (None) (None)  Total protein 6.0 - 8.3 g/dL (None) (None) (None) (None) 6.7  Albumin 3.5 - 5.2 g/dL (None) (None) (None) (None) 3.5   Lab Results  Component Value Date   TSH 2.90 05/21/2015   Lab Results  Component Value Date   BUN 15 05/21/2015   BUN 16 11/10/2014   BUN 19 01/25/2014   Lab Results  Component Value Date   CREATININE 0.9 05/21/2015   CREATININE 0.9 11/10/2014   CREATININE 1.00 04/20/2014   No results found for: HGBA1C     Assessment/Plan  1. Hip pain, left Try [place] patches  2. Cervicalgia Resolved  3. Essential hypertension Controlled  4. Gastroesophageal reflux disease without esophagitis Controlled  5. Tremor Most likely benign essential tremor. Advised patient to try a glass of wine before dinner and see if this helped improve tremor.  6. Depression Stable on current medications. Followed by Dr. Casimiro Needle  7. Postmenopausal atrophic vaginitis Patient not currently having recurrent urinary tract infections, itching, discharge, or discomfort in the vaginal area. Advised her that she could continue the Premarin cream or discontinue it if she desired.

## 2015-06-16 DIAGNOSIS — F333 Major depressive disorder, recurrent, severe with psychotic symptoms: Secondary | ICD-10-CM | POA: Diagnosis not present

## 2015-06-18 DIAGNOSIS — M545 Low back pain: Secondary | ICD-10-CM | POA: Diagnosis not present

## 2015-07-13 DIAGNOSIS — Z23 Encounter for immunization: Secondary | ICD-10-CM | POA: Diagnosis not present

## 2015-07-16 DIAGNOSIS — Z0000 Encounter for general adult medical examination without abnormal findings: Secondary | ICD-10-CM | POA: Diagnosis not present

## 2015-07-20 ENCOUNTER — Other Ambulatory Visit: Payer: Self-pay | Admitting: Internal Medicine

## 2015-08-15 ENCOUNTER — Ambulatory Visit (INDEPENDENT_AMBULATORY_CARE_PROVIDER_SITE_OTHER): Payer: Medicare Other | Admitting: Family Medicine

## 2015-08-15 VITALS — BP 146/80 | HR 94 | Temp 98.8°F | Resp 16 | Ht 62.0 in | Wt 156.8 lb

## 2015-08-15 DIAGNOSIS — R197 Diarrhea, unspecified: Secondary | ICD-10-CM | POA: Diagnosis not present

## 2015-08-15 DIAGNOSIS — R6889 Other general symptoms and signs: Secondary | ICD-10-CM | POA: Diagnosis not present

## 2015-08-15 DIAGNOSIS — J101 Influenza due to other identified influenza virus with other respiratory manifestations: Secondary | ICD-10-CM

## 2015-08-15 DIAGNOSIS — R059 Cough, unspecified: Secondary | ICD-10-CM

## 2015-08-15 DIAGNOSIS — R509 Fever, unspecified: Secondary | ICD-10-CM | POA: Diagnosis not present

## 2015-08-15 DIAGNOSIS — R05 Cough: Secondary | ICD-10-CM | POA: Diagnosis not present

## 2015-08-15 LAB — POCT INFLUENZA A/B
INFLUENZA B, POC: NEGATIVE
Influenza A, POC: POSITIVE — AB

## 2015-08-15 MED ORDER — OSELTAMIVIR PHOSPHATE 75 MG PO CAPS: 75.0000 mg | ORAL_CAPSULE | Freq: Two times a day (BID) | ORAL | Status: DC

## 2015-08-15 NOTE — Patient Instructions (Addendum)
Drink plenty of fluids and get enough rest  Take Tylenol 500 mg maximum of 2 pills 3 times daily if needed for fever  You can take over-the-counter Robitussin-DM if needed for cough  Take the Tamiflu antiviral medication 75 mg one twice daily for the influenza  In the event of getting worse at anytime get rechecked, especially if higher fevers, weaker, not keeping food down, increased cough or shortness of breath.  It is safe to resume your other medications when you can  Influenza, Adult Influenza ("the flu") is a viral infection of the respiratory tract. It occurs more often in winter months because people spend more time in close contact with one another. Influenza can make you feel very sick. Influenza easily spreads from person to person (contagious). CAUSES  Influenza is caused by a virus that infects the respiratory tract. You can catch the virus by breathing in droplets from an infected person's cough or sneeze. You can also catch the virus by touching something that was recently contaminated with the virus and then touching your mouth, nose, or eyes. RISKS AND COMPLICATIONS You may be at risk for a more severe case of influenza if you smoke cigarettes, have diabetes, have chronic heart disease (such as heart failure) or lung disease (such as asthma), or if you have a weakened immune system. Elderly people and pregnant women are also at risk for more serious infections. The most common problem of influenza is a lung infection (pneumonia). Sometimes, this problem can require emergency medical care and may be life threatening. SIGNS AND SYMPTOMS  Symptoms typically last 4 to 10 days and may include:  Fever.  Chills.  Headache, body aches, and muscle aches.  Sore throat.  Chest discomfort and cough.  Poor appetite.  Weakness or feeling tired.  Dizziness.  Nausea or vomiting. DIAGNOSIS  Diagnosis of influenza is often made based on your history and a physical exam. A nose  or throat swab test can be done to confirm the diagnosis. TREATMENT  In mild cases, influenza goes away on its own. Treatment is directed at relieving symptoms. For more severe cases, your health care provider may prescribe antiviral medicines to shorten the sickness. Antibiotic medicines are not effective because the infection is caused by a virus, not by bacteria. HOME CARE INSTRUCTIONS  Take medicines only as directed by your health care provider.  Use a cool mist humidifier to make breathing easier.  Get plenty of rest until your temperature returns to normal. This usually takes 3 to 4 days.  Drink enough fluid to keep your urine clear or pale yellow.  Cover yourmouth and nosewhen coughing or sneezing,and wash your handswellto prevent thevirusfrom spreading.  Stay homefromwork orschool untilthe fever is gonefor at least 1full day. PREVENTION  An annual influenza vaccination (flu shot) is the best way to avoid getting influenza. An annual flu shot is now routinely recommended for all adults in the [place] IF:  You experiencechest pain, yourcough worsens,or you producemore mucus.  Youhave nausea,vomiting, ordiarrhea.  Your fever returns or gets worse. SEEK IMMEDIATE MEDICAL CARE IF:  You havetrouble breathing, you become short of breath,or your skin ornails becomebluish.  You have severe painor stiffnessin the neck.  You develop a sudden headache, or pain in the face or ear.  You have nausea or vomiting that you cannot control. MAKE SURE YOU:   Understand these instructions.  Will watch your condition.  Will get help right away if you are not doing well  or get worse.   This information is not intended to replace advice given to you by your health care provider. Make sure you discuss any questions you have with your health care provider.   Document Released: 04/29/2000 Document Revised: 05/23/2014 Document Reviewed:  08/01/2011 Elsevier Interactive Patient Education 2016 [employer].     IF you received an x-ray today, you will receive an invoice from [facility]. Please contact [facility] at [phone] with questions or concerns regarding your invoice.   IF you received labwork today, you will receive an invoice from [employer]. Please contact [facility] at [phone] with questions or concerns regarding your invoice.   Our billing staff will not be able to assist you with questions regarding bills from these companies.  You will be contacted with the lab results as soon as they are available. The fastest way to get your results is to activate your My Chart account. Instructions are located on the last page of this paperwork. If you have not heard from [place] regarding the results in 2 weeks, please contact this office.

## 2015-08-15 NOTE — Progress Notes (Signed)
Patient ID: Tina Hampton, female    DOB: 1926-05-04  Age: 82 y.o. MRN: [id number]  Chief Complaint  Patient presents with  . Cough  . Fever  . feeling weak    Subjective:   82 year old lady who comes in with history of having been sick probably since Thursday. 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 Yesterday she was coughing a lot and this morning she had a fever up to 100.4. She feels bad. She is weak feeling. She has had a very light headache. Not much head congestion or sore throat or ear pain symptoms. She doesn't know whether she has body aches because of her arthritis and age she aches all the time anyhow, it does not seem a lot worse. She did have a little diarrhea this morning. No nausea or vomiting. Doesn't know of specific exposure though she is around a lot of other elderly. She lives at friend's home [place].  Current allergies, medications, problem list, past/family and social histories reviewed.  Objective:  BP 146/80 mmHg  Pulse 94  Temp(Src) 98.8 F (37.1 C) (Oral)  Resp 16  Ht 5\' 2"  (1.575 m)  Wt 156 lb 12.8 oz (71.124 kg)  BMI 28.67 kg/m2  SpO2 94%  Pleasant lady, alert and oriented. Does not look very ill. Her TMs are normal. Nose fairly clear. Throat clear. She didn't eat much yesterday or drink much, though she is drop this morning. However her mucous membranes look a little dry. Neck supple without nodes. Chest is clear to auscultation. Heart regular without murmur.  Assessment & Plan:   Assessment: 1. Flu-like symptoms   2. Cough   3. Fever, unspecified fever cause   4. Diarrhea, unspecified type   5. Influenza A       Plan: Flulike symptoms, will check flu test. It may be flu or more likely just one of the other respiratory viruses.  Orders Placed This Encounter  Procedures  . POCT Influenza A/B    Meds ordered this encounter  Medications  . oseltamivir (TAMIFLU) 75 MG capsule    Sig: Take 1 capsule (75 mg  total) by mouth 2 (two) times daily.    Dispense:  10 capsule    Refill:  0    Results for orders placed or performed in visit on 08/15/15  POCT Influenza A/B  Result Value Ref Range   Influenza A, POC Positive (A) Negative   Influenza B, POC Negative Negative        Patient Instructions   Drink plenty of fluids and get enough rest  Take Tylenol 500 mg maximum of 2 pills 3 times daily if needed for fever  You can take over-the-counter Robitussin-DM if needed for cough  Take the Tamiflu antiviral medication 75 mg one twice daily for the influenza  In the event of getting worse at anytime get rechecked, especially if higher fevers, weaker, not keeping food down, increased cough or shortness of breath.  It is safe to resume your other medications when you can  Influenza, Adult Influenza ("the flu") is a viral infection of the respiratory tract. It occurs more often in winter months because people spend more time in close contact with one another. Influenza can make you feel very sick. Influenza easily spreads from person to person (contagious). CAUSES  Influenza is caused by a virus that infects the respiratory tract. You can catch the virus by breathing in droplets from an infected person's cough or sneeze.  You can also catch the virus by touching something that was recently contaminated with the virus and then touching your mouth, nose, or eyes. RISKS AND COMPLICATIONS You may be at risk for a more severe case of influenza if you smoke cigarettes, have diabetes, have chronic heart disease (such as heart failure) or lung disease (such as asthma), or if you have a weakened immune system. Elderly people and pregnant women are also at risk for more serious infections. The most common problem of influenza is a lung infection (pneumonia). Sometimes, this problem can require emergency medical care and may be life threatening. SIGNS AND SYMPTOMS  Symptoms typically last 4 to 10 days and  may include:  Fever.  Chills.  Headache, body aches, and muscle aches.  Sore throat.  Chest discomfort and cough.  Poor appetite.  Weakness or feeling tired.  Dizziness.  Nausea or vomiting. DIAGNOSIS  Diagnosis of influenza is often made based on your history and a physical exam. A nose or throat swab test can be done to confirm the diagnosis. TREATMENT  In mild cases, influenza goes away on its own. Treatment is directed at relieving symptoms. For more severe cases, your health care provider may prescribe antiviral medicines to shorten the sickness. Antibiotic medicines are not effective because the infection is caused by a virus, not by bacteria. HOME CARE INSTRUCTIONS  Take medicines only as directed by your health care provider.  Use a cool mist humidifier to make breathing easier.  Get plenty of rest until your temperature returns to normal. This usually takes 3 to 4 days.  Drink enough fluid to keep your urine clear or pale yellow.  Cover yourmouth and nosewhen coughing or sneezing,and wash your handswellto prevent thevirusfrom spreading.  Stay homefromwork orschool untilthe fever is gonefor at least 91full day. PREVENTION  An annual influenza vaccination (flu shot) is the best way to avoid getting influenza. An annual flu shot is now routinely recommended for all adults in the [place] IF:  You experiencechest pain, yourcough worsens,or you producemore mucus.  Youhave nausea,vomiting, ordiarrhea.  Your fever returns or gets worse. SEEK IMMEDIATE MEDICAL CARE IF:  You havetrouble breathing, you become short of breath,or your skin ornails becomebluish.  You have severe painor stiffnessin the neck.  You develop a sudden headache, or pain in the face or ear.  You have nausea or vomiting that you cannot control. MAKE SURE YOU:   Understand these instructions.  Will watch your condition.  Will get help right away if  you are not doing well or get worse.   This information is not intended to replace advice given to you by your health care provider. Make sure you discuss any questions you have with your health care provider.   Document Released: 04/29/2000 Document Revised: 05/23/2014 Document Reviewed: 08/01/2011 Elsevier Interactive Patient Education 2016 [employer].     IF you received an x-ray today, you will receive an invoice from [facility]. Please contact [facility] at [phone] with questions or concerns regarding your invoice.   IF you received labwork today, you will receive an invoice from [employer]. Please contact [facility] at [phone] with questions or concerns regarding your invoice.   Our billing staff will not be able to assist you with questions regarding bills from these companies.  You will be contacted with the lab results as soon as they are available. The fastest way to get your results is to activate your My Chart account. Instructions  are located on the last page of this paperwork. If you have not heard from [place] regarding the results in 2 weeks, please contact this office.          Return if symptoms worsen or fail to improve.   Rahiem Schellinger, MD 08/15/2015

## 2015-08-19 ENCOUNTER — Ambulatory Visit (INDEPENDENT_AMBULATORY_CARE_PROVIDER_SITE_OTHER): Payer: Medicare Other | Admitting: Emergency Medicine

## 2015-08-19 ENCOUNTER — Ambulatory Visit (INDEPENDENT_AMBULATORY_CARE_PROVIDER_SITE_OTHER): Payer: Medicare Other

## 2015-08-19 VITALS — BP 116/68 | HR 82 | Temp 98.0°F | Resp 18 | Ht 62.0 in | Wt 139.0 lb

## 2015-08-19 DIAGNOSIS — J069 Acute upper respiratory infection, unspecified: Secondary | ICD-10-CM | POA: Diagnosis not present

## 2015-08-19 DIAGNOSIS — R062 Wheezing: Secondary | ICD-10-CM

## 2015-08-19 DIAGNOSIS — R0602 Shortness of breath: Secondary | ICD-10-CM | POA: Diagnosis not present

## 2015-08-19 MED ORDER — ALBUTEROL SULFATE (2.5 MG/3ML) 0.083% IN NEBU
2.5000 mg | INHALATION_SOLUTION | Freq: Once | RESPIRATORY_TRACT | Status: AC
  Administered 2015-08-19: 2.5 mg via RESPIRATORY_TRACT

## 2015-08-19 MED ORDER — ALBUTEROL SULFATE HFA 108 (90 BASE) MCG/ACT IN AERS: 2.0000 | INHALATION_SPRAY | RESPIRATORY_TRACT | Status: DC | PRN

## 2015-08-19 MED ORDER — AZITHROMYCIN 250 MG PO TABS: ORAL_TABLET | ORAL | Status: DC

## 2015-08-19 NOTE — Patient Instructions (Addendum)
We are starting antibiotic (azithromycin) and albuterol breathing treatment for questionable upper respiratory infection Xray shows left lower lobe atelectasis- need to work on regular deep breathing, incentive spirometer if available  Please use albuterol inhaler every 4 hours for the next 2 days for breathing then every 4 hours as needed  Please return if you are not feeling better in 48 hours or if you get worse    IF you received an x-ray today, you will receive an invoice from [facility]. Please contact [facility] at [phone] with questions or concerns regarding your invoice.   IF you received labwork today, you will receive an invoice from [employer]. Please contact [facility] at [phone] with questions or concerns regarding your invoice.   Our billing staff will not be able to assist you with questions regarding bills from these companies.  You will be contacted with the lab results as soon as they are available. The fastest way to get your results is to activate your My Chart account. Instructions are located on the last page of this paperwork. If you have not heard from [place] regarding the results in 2 weeks, please contact this office.

## 2015-08-19 NOTE — Progress Notes (Signed)
Subjective:    Patient ID: Tina Hampton, female    DOB: [birth year], 82 y.o.   MRN: [id number]  HPI  This is a pleasant 82 yo female who presents today with cough for several days. Started with dry cough but she is currently coughing up small amount phlegm. No cough at night. She was seen 08/15/15 and had positive flu and was treated with Tamiflu. She feels tired, no generalized aches, no sore throat. No SOB. Hearing some upper airway wheezing. Able to sleep lying down last night. Feels slightly light headed and feels "uncomfortable."  Is ambulating normally using walker. Was evaluated by nurse at [facility] where she lives and was told she had abnormal breath sounds and needed a CXR.   She is seen regularly by Dr. Elder Negus.   Past Medical History  Diagnosis Date  . High cholesterol   . Hypertension   . GERD (gastroesophageal reflux disease)   . Hiatal hernia   . Scoliosis   . DDD (degenerative disc disease)   . Depression   . Pain in joint, pelvic region and thigh 11/12/2011  . Insomnia, unspecified 10/18/2011  . Abnormal mammogram, unspecified 04/19/2011  . Chronic rhinitis 09/28/2010  . Palpitations 09/29/2009  . Chronic kidney disease, stage II (mild) 04/03/2009  . Nocturia 09/19/2008  . Other abnormal blood chemistry 04/03/2007  . Pain in joint, shoulder region 08/15/2006  . Tension headache 10/04/2005  . Pain in limb 08/26/2005  . Diverticulosis of colon (without mention of hemorrhage) 06/17/2005  . Dermatophytosis of nail 01/21/2005  . Unspecified constipation 01/21/2005  . Backache, unspecified 01/21/2005  . Postmenopausal atrophic vaginitis 12/14/2004  . Senile osteoporosis 12/14/2004  . Unspecified cataract 11/26/2004  . Edema 11/26/2004  . Unspecified urinary incontinence 11/26/2004  . Allergy   . Anxiety   . Osteoporosis, senile 11/18/2014  . Vaginal stenosis 11/18/2014    Tight band about 1/2 inches into the vagina which will not allow passage of my index finger.   Marland Kitchen PAOD  (peripheral arterial occlusive disease) ([place]) 11/18/2014    Right leg; diminished popliteal, dorsalis pedis, and posterior tibial artery pulsations   . Hyperglycemia 11/18/2014  . Cervicalgia 08/20/2013  . Left inguinal hernia 04/24/2014   Past Surgical History  Procedure Laterality Date  . Tonsillectomy      Dr.Joe Tamala Julian  . Breast surgery      benign tumor removal, left breast 1969  . Cholecystectomy  1990    Dr.Moore   . Vaginal prolapse repair  2002    Dr.Horback   . Bilateral eyelid surgery  2005    Dr.Scott  . Cataract extraction      Right  . Hip pinning,cannulated  11/07/2011    Procedure: CANNULATED HIP PINNING;  Surgeon: Jessy Oto, MD;  Location: [facility];  Service: Orthopedics;  Laterality: Left;  . Abdominal hysterectomy  1969    Dr.Braun   . Bilateral oophorectomy  2002  . Orif femoral neck fracture w/ dhs  11/07/2011    Dr.Nitka   . Colonoscopy  3/18/20008    inflammation, diverticular associated colitis -Dr. Ardis Hughs  . Cosmetic surgery    . Eye surgery    . Tubal ligation    . Esophagogastroduodenoscopy N/A 01/24/2014    Procedure: ESOPHAGOGASTRODUODENOSCOPY (EGD);  Surgeon: Irene Shipper, MD;  Location: [facility] ENDOSCOPY;  Service: Endoscopy;  Laterality: N/A;  . Foreign body removal N/A 01/24/2014    Procedure: FOREIGN BODY REMOVAL;  Surgeon: Irene Shipper, MD;  Location:  [place] ENDOSCOPY;  Service: Endoscopy;  Laterality: N/A;  . Esophagogastroduodenoscopy endoscopy  04/04/14    Dr. Ardis Hughs   Family History  Problem Relation Age of Onset  . Heart disease Mother   . Stroke Father   . Heart disease Brother   . Esophageal cancer Neg Hx   . Stomach cancer Neg Hx   . Colon cancer Neg Hx    Social History  Substance Use Topics  . Smoking status: Never Smoker   . Smokeless tobacco: Never Used  . Alcohol Use: No                                        Review of Systems  Constitutional: Positive for fatigue. Negative for fever, chills, diaphoresis and appetite change.    HENT: Positive for congestion (chronic), postnasal drip, rhinorrhea and sore throat ("irritated"). Negative for ear pain.   Respiratory: Positive for cough and wheezing. Negative for chest tightness and shortness of breath.   Cardiovascular: Negative for chest pain and leg swelling.       Objective:   Physical Exam  Constitutional: She is oriented to person, place, and time. She appears well-developed and well-nourished. No distress.  HENT:  Head: Normocephalic and atraumatic.  Right Ear: External ear normal.  Left Ear: External ear normal.  Mouth/Throat: Uvula is midline and mucous membranes are normal. Posterior oropharyngeal erythema present. No oropharyngeal exudate or posterior oropharyngeal edema.  Eyes: Conjunctivae are normal.  Neck: Normal range of motion. Neck supple.  Cardiovascular: Normal rate and regular rhythm.   Pulmonary/Chest: Effort normal. She has wheezes (fine, expiratory throughout.).  Musculoskeletal: Normal range of motion.  Lymphadenopathy:    She has no cervical adenopathy.  Neurological: She is alert and oriented to person, place, and time.  Skin: Skin is warm. She is not diaphoretic.  Psychiatric: She has a normal mood and affect. Her behavior is normal. Judgment and thought content normal.  Vitals reviewed.  BP 116/68 mmHg  Pulse 82  Temp(Src) 98 F (36.7 C) (Oral)  Resp 18  Ht 5\' 2"  (1.575 m)  Wt 139 lb (63.05 kg)  BMI 25.42 kg/m2  SpO2 94% Dg Chest 2 View  08/19/2015  CLINICAL DATA:  Wheezing.  Short of breath EXAM: CHEST  2 VIEW COMPARISON:  01/24/2014 FINDINGS: Large hiatal hernia with air-fluid level has progressed since the prior study. Left lower lobe atelectasis also has progressed in the interval. Negative for heart failure. Mild right lower lobe atelectasis. No significant pleural effusion. IMPRESSION: Progression of large hiatal hernia with air-fluid level Progression of bibasilar atelectasis left greater than right. Electronically Signed    By: Franchot Gallo M.D.   On: 08/19/2015 14:45   Albuterol nebulizer treatment with some loosening of cough and slight decrease of wheezes.     Assessment & Plan:  Discussed with Dr. Everlene Farrier 1. Wheezing - DG Chest 2 View; Future - albuterol (PROVENTIL) (2.5 MG/3ML) 0.083% nebulizer solution 2.5 mg; Take 3 mLs (2.5 mg total) by nebulization once. - albuterol (PROVENTIL HFA;VENTOLIN HFA) 108 (90 Base) MCG/ACT inhaler; Inhale 2 puffs into the lungs every 4 (four) hours as needed for wheezing or shortness of breath (cough, shortness of breath or wheezing.).  Dispense: 1 Inhaler; Refill: 1  2. Acute upper respiratory infection - CXR with worsening bibasilar atelectasis, will cover for bacterial infection - azithromycin (ZITHROMAX) 250 MG tablet; Take 2 tablets today and 1  a day for next 4 days  Dispense: 6 tablet; Refill: 0 - albuterol (PROVENTIL HFA;VENTOLIN HFA) 108 (90 Base) MCG/ACT inhaler; Inhale 2 puffs into the lungs every 4 (four) hours as needed for wheezing or shortness of breath (cough, shortness of breath or wheezing.).  Dispense: 1 Inhaler; Refill: 1 - I called and left message for nurse at [facility] regarding diagnosis and treatment plan. Asked if she could procure an incentive spirometer for patient to help with atelectasis. Requested daily assessment to monitor for any worsening symptoms.  - encouraged patient to increase fluids, work on deep breathing every hour while awake  Clarene Reamer, FNP-BC  [facility]  08/19/2015 9:17 PM

## 2015-09-01 ENCOUNTER — Non-Acute Institutional Stay: Payer: Medicare Other | Admitting: Internal Medicine

## 2015-09-01 ENCOUNTER — Encounter: Payer: Self-pay | Admitting: Internal Medicine

## 2015-09-01 VITALS — BP 124/62 | HR 88 | Temp 98.0°F | Ht 62.0 in | Wt 150.0 lb

## 2015-09-01 DIAGNOSIS — F32A Depression, unspecified: Secondary | ICD-10-CM

## 2015-09-01 DIAGNOSIS — M542 Cervicalgia: Secondary | ICD-10-CM | POA: Diagnosis not present

## 2015-09-01 DIAGNOSIS — F329 Major depressive disorder, single episode, unspecified: Secondary | ICD-10-CM | POA: Diagnosis not present

## 2015-09-01 DIAGNOSIS — J111 Influenza due to unidentified influenza virus with other respiratory manifestations: Secondary | ICD-10-CM | POA: Insufficient documentation

## 2015-09-01 NOTE — Progress Notes (Signed)
Patient ID: Tina Hampton, female   DOB: [birth year], 82 y.o.   MRN: [id number]    [facility]     Place of Service: Clinic (12)     Allergies  Allergen Reactions  . Penicillins Other (See Comments)    CHILDHOOD REACTION    Chief Complaint  Patient presents with  . Medical Management of Chronic Issues    still has a slight cough. Patient is concern that she had the flu. Had gone to Urgent Care 08/15/15 tested positive for flu, then again 08/19/15 with cough, Chest x-ray show worsening bibasilar atelectasis. Her medications got mixed up ( Effexor-XR). "She's just depressed".    HPI:  Recent flu. Took both azithromycin and Tamiflu. Still rattling in the chest. Had somr increse in depression, but thinks she is improving now. Denies fever, chills, myalgias, nausea.   Using venlafaxine as prescribed.   Neck pains have improved.  Medications: Patient's Medications  New Prescriptions   No medications on file  Previous Medications   ALBUTEROL (PROVENTIL HFA;VENTOLIN HFA) 108 (90 BASE) MCG/ACT INHALER    Inhale 2 puffs into the lungs every 4 (four) hours as needed for wheezing or shortness of breath (cough, shortness of breath or wheezing.).   ASPIRIN 81 MG TABLET    Take 81 mg by mouth daily.   BACLOFEN (LIORESAL) 10 MG TABLET    Take 0.5 tablets (5 mg total) by mouth 2 (two) times daily as needed for muscle spasms.   BUPROPION (WELLBUTRIN XL) 150 MG 24 HR TABLET    Take 450 mg by mouth daily.   CALCIUM CARBONATE-VITAMIN D (CALCIUM 600+D3 PO)    Take by mouth. Take 3 daily   CHLORPHENIRAMINE-HYDROCODONE (TUSSIONEX PENNKINETIC ER) 10-8 MG/5ML LQCR    Take one tsp every 12 hours if needed to control  cough   CHOLECALCIFEROL (VITAMIN D) 1000 UNITS TABLET    Take 1,000 Units by mouth daily.   CLONAZEPAM (KLONOPIN) 0.5 MG TABLET    Take 0.5 mg by mouth. Take one tablet twice daily  for anxiety as needed for anxiety   GLUCOSAMINE HCL 1000 MG TABS    Take 2,000 mg by mouth 2  (two) times daily.   IBANDRONATE (BONIVA) 150 MG TABLET    Take 1 tablet (150 mg total) by mouth every 30 (thirty) days. Take in the morning with a full glass of water, on an empty stomach, and do not take anything else by mouth or lie down for the next 30 min.   MELOXICAM (MOBIC) 7.5 MG TABLET    One daily to help arthritis   MULTIPLE VITAMIN (MULTIVITAMIN WITH MINERALS) TABS    Take 1 tablet by mouth daily.   NIFEDIPINE (PROCARDIA XL/ADALAT-CC) 60 MG 24 HR TABLET    TAKE 1 TABLET BY MOUTH EVERY DAY   PANTOPRAZOLE (PROTONIX) 40 MG TABLET    TAKE ONE TABLET BY MOUTH ONCE DAILY FOR STOMACH   POLYETHYL GLYCOL-PROPYL GLYCOL (SYSTANE) 0.4-0.3 % SOLN    Place 1 drop into both eyes 2 (two) times daily.    POLYETHYLENE GLYCOL POWDER (GLYCOLAX/MIRALAX) POWDER    Take 17 g by mouth 2 (two) times daily as needed.   PRAVASTATIN (PRAVACHOL) 20 MG TABLET    Take one tablet by mouth once daily for cholesterol   PROBIOTIC PRODUCT (PROBIOTIC PO)    Take 1 tablet by mouth daily.   VENLAFAXINE XR (EFFEXOR-XR) 150 MG 24 HR CAPSULE    Take 150 mg by mouth every morning.  Modified  Medications   No medications on file  Discontinued Medications   AZITHROMYCIN (ZITHROMAX) 250 MG TABLET    Take 2 tablets today and 1 a day for next 4 days   CONJUGATED ESTROGENS (PREMARIN) VAGINAL CREAM    Place 0.5 Applicatorfuls vaginally See admin instructions. Pt uses twice weekly. No certain days   NAPROXEN SODIUM (ALEVE) 220 MG TABLET    Take 1 tablet (220 mg total) by mouth 2 (two) times daily with a meal.   OSELTAMIVIR (TAMIFLU) 75 MG CAPSULE    Take 1 capsule (75 mg total) by mouth 2 (two) times daily.     Review of Systems  Constitutional: Negative.  Negative for unexpected weight change.  HENT: Positive for hearing loss. Negative for ear pain.   Eyes: Positive for visual disturbance (corrective lenses).  Respiratory: Positive for cough.   Cardiovascular:       Aortic ejection murmur noted 04/20/14.  Gastrointestinal:  Positive for abdominal pain (Left lower quadrant).       Heartburn. Constipation. History of hiatal hernia with 1/2 stomach in the chest. Impaction of meat in the esophagus 01/24/14. EGD with dilation of esophageal stricture by Dr. Ardis Hughs on 04/04/14.  Left inguinal hernia on 04/20/14.  Endocrine: Negative for polydipsia, polyphagia and polyuria.       History of mild hyperglycemia in September 2015 and June 2016.  Genitourinary: Negative for hematuria.       Nocturia x5-6. Increased frequency in the daytime. Urge incontinence. Hx urinary tract infection. Vaginal atrophy and stenosis  Musculoskeletal: Positive for neck pain and neck stiffness.       Chronic mild back discomfort. Some joint pains. No joint swelling, muscular pain, or muscular weakness.  Skin: Negative for rash.  Allergic/Immunologic: Negative.   Neurological:       11/18/14 MMSE 30/30. Passed clock drawing.  Hematological: Negative.   Psychiatric/Behavioral: Positive for sleep disturbance and dysphoric mood. The patient is nervous/anxious.        Insomnia.Danley Danker Vitals:   09/01/15 0929  BP: 124/62  Pulse: 88  Temp: 98 F (36.7 C)  TempSrc: Oral  Height: 5' 2"  (1.575 m)  Weight: 150 lb (68.04 kg)  SpO2: 95%   Wt Readings from Last 3 Encounters:  09/01/15 150 lb (68.04 kg)  08/19/15 139 lb (63.05 kg)  08/15/15 156 lb 12.8 oz (71.124 kg)    Body mass index is 27.43 kg/(m^2).  Physical Exam  Constitutional: She is oriented to person, place, and time. She appears well-developed and well-nourished. No distress.  HENT:  Head: Normocephalic.  Right Ear: External ear normal.  Left Ear: External ear normal.  Nose: Nose normal.  Mouth/Throat: Oropharynx is clear and moist.   Mild hearing loss bilaterally.  Eyes:  Wears corrective lenses. Left cataract present. Right pupil seems to be nonreactive to light. There are 2 white cystic areas on the left upper eyelid medially. 2 additional areas are present on the  left lower lid centrally.  Neck: Neck supple. No JVD present. No tracheal deviation present. No thyromegaly present.  Cardiovascular: Normal rate and regular rhythm.  Exam reveals no gallop and no friction rub.   No murmur heard. 3/6 aortic ejection murmur with radiation towards left sternal border as well.  Diminished pulsation at right popliteal, right posterior tibial, and right dorsalis pedis.  Pulmonary/Chest: Breath sounds normal. No respiratory distress. She has no wheezes. She has no rales. She exhibits no tenderness.  Cough and bronchial rattle  Abdominal: She exhibits  no distension and no mass. There is no tenderness.  Not tender in the left groin or left lower quadrant. Small bulge in the left groin.  Genitourinary: Guaiac negative stool. No vaginal discharge found.  Approximately 1-1/2 inches into the vagina, there is a thin tissue band at is circumferential and narrows the vagina. I'm unable to penetrate through the band with my index finger. There is no tenderness, bleeding, or other excessive tissue growth in this area.  Musculoskeletal: She exhibits edema.  Flat feet with collapsed arches. Unstable gait. Using cane.  Neurological: She is alert and oriented to person, place, and time. She has normal reflexes. No cranial nerve deficit. Coordination normal.  Skin: No rash noted. No erythema. No pallor.  Multiple seborrheic keratoses.  Psychiatric: She has a normal mood and affect. Her behavior is normal. Judgment and thought content normal.     Labs reviewed: Lab Summary Latest Ref Rng 05/21/2015 11/10/2014 04/20/2014  Hemoglobin 12.2 - 16.2 g/dL (None) (None) 13.1  Hematocrit 37.7 - 47.9 % (None) (None) 40.9  White count 4.6 - 10.2 K/uL (None) (None) 7.8  Platelet count - (None) (None) (None)  Sodium 137 - 147 mmol/L 140 142 (None)  Potassium 3.4 - 5.3 mmol/L 4.8 4.6 (None)  Calcium - (None) (None) (None)  Phosphorus - (None) (None) (None)  Creatinine 0.5 - 1.1 mg/dL 0.9 0.9  1.00  AST 13 - 35 U/L 25 22 (None)  Alk Phos 25 - 125 U/L 60 58 (None)  Bilirubin - (None) (None) (None)  Glucose - 91 109 (None)  Cholesterol 0 - 200 mg/dL 159 163 (None)  HDL cholesterol 35 - 70 mg/dL 59 59 (None)  Triglycerides 40 - 160 mg/dL 89 90 (None)  LDL Direct - (None) (None) (None)  LDL Calc - 82 86 (None)  Total protein - (None) (None) (None)  Albumin - (None) (None) (None)   Lab Results  Component Value Date   TSH 2.90 05/21/2015   Lab Results  Component Value Date   BUN 15 05/21/2015   BUN 16 11/10/2014   BUN 19 01/25/2014   Lab Results  Component Value Date   CREATININE 0.9 05/21/2015   CREATININE 0.9 11/10/2014   CREATININE 1.00 04/20/2014   No results found for: HGBA1C     Assessment/Plan  1. Influenza Recovering. Reassured that she will be better in time.  2. Cervicalgia Improved. Does not need baclofen every day.  3. Depression *Has appt to see counselor

## 2015-09-07 DIAGNOSIS — F333 Major depressive disorder, recurrent, severe with psychotic symptoms: Secondary | ICD-10-CM | POA: Diagnosis not present

## 2015-09-16 DIAGNOSIS — F333 Major depressive disorder, recurrent, severe with psychotic symptoms: Secondary | ICD-10-CM | POA: Diagnosis not present

## 2015-09-24 DIAGNOSIS — F333 Major depressive disorder, recurrent, severe with psychotic symptoms: Secondary | ICD-10-CM | POA: Diagnosis not present

## 2015-09-28 DIAGNOSIS — F333 Major depressive disorder, recurrent, severe with psychotic symptoms: Secondary | ICD-10-CM | POA: Diagnosis not present

## 2015-10-06 DIAGNOSIS — F333 Major depressive disorder, recurrent, severe with psychotic symptoms: Secondary | ICD-10-CM | POA: Diagnosis not present

## 2015-10-20 ENCOUNTER — Encounter: Payer: Self-pay | Admitting: Internal Medicine

## 2015-10-20 ENCOUNTER — Non-Acute Institutional Stay: Payer: Medicare Other | Admitting: Internal Medicine

## 2015-10-20 VITALS — BP 156/94 | HR 82 | Temp 97.7°F | Ht 62.0 in | Wt 145.0 lb

## 2015-10-20 DIAGNOSIS — M898X1 Other specified disorders of bone, shoulder: Secondary | ICD-10-CM | POA: Insufficient documentation

## 2015-10-20 DIAGNOSIS — I10 Essential (primary) hypertension: Secondary | ICD-10-CM | POA: Diagnosis not present

## 2015-10-20 MED ORDER — BACLOFEN 10 MG PO TABS
5.0000 mg | ORAL_TABLET | Freq: Two times a day (BID) | ORAL | Status: DC | PRN
Start: 2015-10-20 — End: 2016-04-18

## 2015-10-20 NOTE — Progress Notes (Signed)
Patient ID: Tina Hampton, female   DOB: [birth year], 82 y.o.   MRN: [id number]    [facility]     Place of Service: Clinic (12)     Allergies  Allergen Reactions  . Penicillins Other (See Comments)    CHILDHOOD REACTION    Chief Complaint  Patient presents with  . Acute Visit    pain left  shoulder, has used heat, ice pain crea, helps a little    HPI:  Pain of left scapula  Essential hypertension    Medications: Patient's Medications  New Prescriptions   No medications on file  Previous Medications   ALBUTEROL (PROVENTIL HFA;VENTOLIN HFA) 108 (90 BASE) MCG/ACT INHALER    Inhale 2 puffs into the lungs every 4 (four) hours as needed for wheezing or shortness of breath (cough, shortness of breath or wheezing.).   ASPIRIN 81 MG TABLET    Take 81 mg by mouth daily.   BACLOFEN (LIORESAL) 10 MG TABLET    Take 0.5 tablets (5 mg total) by mouth 2 (two) times daily as needed for muscle spasms.   BUPROPION (WELLBUTRIN XL) 150 MG 24 HR TABLET    Take 450 mg by mouth daily.   CALCIUM CARBONATE-VITAMIN D (CALCIUM 600+D3 PO)    Take by mouth. Take 3 daily   CHLORPHENIRAMINE-HYDROCODONE (TUSSIONEX PENNKINETIC ER) 10-8 MG/5ML LQCR    Take one tsp every 12 hours if needed to control  cough   CHOLECALCIFEROL (VITAMIN D) 1000 UNITS TABLET    Take 1,000 Units by mouth daily.   CLONAZEPAM (KLONOPIN) 0.5 MG TABLET    Take 0.5 mg by mouth. Take one half  tablet in morning one tablet in evening as needed for anxiety   GLUCOSAMINE HCL 1000 MG TABS    Take 2,000 mg by mouth 2 (two) times daily.   IBANDRONATE (BONIVA) 150 MG TABLET    Take 1 tablet (150 mg total) by mouth every 30 (thirty) days. Take in the morning with a full glass of water, on an empty stomach, and do not take anything else by mouth or lie down for the next 30 min.   MELOXICAM (MOBIC) 7.5 MG TABLET    One daily to help arthritis   MULTIPLE VITAMIN (MULTIVITAMIN WITH MINERALS) TABS    Take 1 tablet by mouth daily.     NIFEDIPINE (PROCARDIA XL/ADALAT-CC) 60 MG 24 HR TABLET    TAKE 1 TABLET BY MOUTH EVERY DAY   PANTOPRAZOLE (PROTONIX) 40 MG TABLET    TAKE ONE TABLET BY MOUTH ONCE DAILY FOR STOMACH   POLYETHYL GLYCOL-PROPYL GLYCOL (SYSTANE) 0.4-0.3 % SOLN    Place 1 drop into both eyes 2 (two) times daily.    POLYETHYLENE GLYCOL POWDER (GLYCOLAX/MIRALAX) POWDER    Take 17 g by mouth 2 (two) times daily as needed.   PRAVASTATIN (PRAVACHOL) 20 MG TABLET    Take one tablet by mouth once daily for cholesterol   PROBIOTIC PRODUCT (PROBIOTIC PO)    Take 1 tablet by mouth daily.   VENLAFAXINE XR (EFFEXOR-XR) 150 MG 24 HR CAPSULE    Take 150 mg by mouth every morning.  Modified Medications   No medications on file  Discontinued Medications   No medications on file     Review of Systems  Constitutional: Negative.  Negative for unexpected weight change.  HENT: Positive for hearing loss. Negative for ear pain.   Eyes: Positive for visual disturbance (corrective lenses).  Respiratory: Positive for cough.   Cardiovascular:  Aortic ejection murmur noted 04/20/14.  Gastrointestinal: Positive for abdominal pain (Left lower quadrant).       Heartburn. Constipation. History of hiatal hernia with 1/2 stomach in the chest. Impaction of meat in the esophagus 01/24/14. EGD with dilation of esophageal stricture by Dr. Ardis Hughs on 04/04/14.  Left inguinal hernia on 04/20/14.  Endocrine: Negative for polydipsia, polyphagia and polyuria.       History of mild hyperglycemia in September 2015 and June 2016.  Genitourinary: Negative for hematuria.       Nocturia x5-6. Increased frequency in the daytime. Urge incontinence. Hx urinary tract infection. Vaginal atrophy and stenosis  Musculoskeletal: Positive for neck pain and neck stiffness.       Chronic mild back discomfort. Some joint pains. Pain in the left scapular area inferiorly below the scapular spine. Shoulder is crepitant, but ROM is unimpaired.  Skin: Negative for  rash.  Allergic/Immunologic: Negative.   Neurological:       11/18/14 MMSE 30/30. Passed clock drawing.  Hematological: Negative.   Psychiatric/Behavioral: Positive for sleep disturbance and dysphoric mood. The patient is nervous/anxious.        Insomnia.Danley Danker Vitals:   10/20/15 0946  BP: 156/94  Pulse: 82  Temp: 97.7 F (36.5 C)  Height: 5' 2"  (1.575 m)  Weight: 145 lb (65.772 kg)  SpO2: 94%   Wt Readings from Last 3 Encounters:  10/20/15 145 lb (65.772 kg)  09/01/15 150 lb (68.04 kg)  08/19/15 139 lb (63.05 kg)    Body mass index is 26.51 kg/(m^2).  Physical Exam  Constitutional: She is oriented to person, place, and time. She appears well-developed and well-nourished. No distress.  HENT:  Head: Normocephalic.  Right Ear: External ear normal.  Left Ear: External ear normal.  Nose: Nose normal.  Mouth/Throat: Oropharynx is clear and moist.   Mild hearing loss bilaterally.  Eyes:  Wears corrective lenses. Left cataract present. Right pupil seems to be nonreactive to light. There are 2 white cystic areas on the left upper eyelid medially. 2 additional areas are present on the left lower lid centrally.  Neck: Neck supple. No JVD present. No tracheal deviation present. No thyromegaly present.  Cardiovascular: Normal rate and regular rhythm.  Exam reveals no gallop and no friction rub.   No murmur heard. 3/6 aortic ejection murmur with radiation towards left sternal border as well.  Diminished pulsation at right popliteal, right posterior tibial, and right dorsalis pedis.  Pulmonary/Chest: Breath sounds normal. No respiratory distress. She has no wheezes. She has no rales. She exhibits no tenderness.  Cough and bronchial rattle  Abdominal: She exhibits no distension and no mass. There is no tenderness.  Not tender in the left groin or left lower quadrant. Small bulge in the left groin.  Genitourinary: Guaiac negative stool. No vaginal discharge found.  Prior exam:    approximately 1-1/2 inches into the vagina, there is a thin tissue band at is circumferential and narrows the vagina. I'm unable to penetrate through the band with my index finger. There is no tenderness, bleeding, or other excessive tissue growth in this area.  Musculoskeletal: She exhibits edema.  Flat feet with collapsed arches. Unstable gait. Using cane. Pain in the left scapular area under the scapular spine.  Neurological: She is alert and oriented to person, place, and time. She has normal reflexes. No cranial nerve deficit. Coordination normal.  Skin: No rash noted. No erythema. No pallor.  Multiple seborrheic keratoses.  Psychiatric: She has a  normal mood and affect. Her behavior is normal. Judgment and thought content normal.     Labs reviewed: Lab Summary Latest Ref Rng 05/21/2015 11/10/2014 04/20/2014  Hemoglobin 12.2 - 16.2 g/dL (None) (None) 13.1  Hematocrit 37.7 - 47.9 % (None) (None) 40.9  White count 4.6 - 10.2 K/uL (None) (None) 7.8  Platelet count - (None) (None) (None)  Sodium 137 - 147 mmol/L 140 142 (None)  Potassium 3.4 - 5.3 mmol/L 4.8 4.6 (None)  Calcium - (None) (None) (None)  Phosphorus - (None) (None) (None)  Creatinine 0.5 - 1.1 mg/dL 0.9 0.9 1.00  AST 13 - 35 U/L 25 22 (None)  Alk Phos 25 - 125 U/L 60 58 (None)  Bilirubin - (None) (None) (None)  Glucose - 91 109 (None)  Cholesterol 0 - 200 mg/dL 159 163 (None)  HDL cholesterol 35 - 70 mg/dL 59 59 (None)  Triglycerides 40 - 160 mg/dL 89 90 (None)  LDL Direct - (None) (None) (None)  LDL Calc - 82 86 (None)  Total protein - (None) (None) (None)  Albumin - (None) (None) (None)   Lab Results  Component Value Date   TSH 2.90 05/21/2015   Lab Results  Component Value Date   BUN 15 05/21/2015   BUN 16 11/10/2014   BUN 19 01/25/2014   Lab Results  Component Value Date   CREATININE 0.9 05/21/2015   CREATININE 0.9 11/10/2014   CREATININE 1.00 04/20/2014   No results found for:  HGBA1C     Assessment/Plan  1. Pain of left scapula -Meloxicam 7.5 mg qd - baclofen (LIORESAL) 10 MG tablet; Take 0.5 tablets (5 mg total) by mouth 2 (two) times daily as needed for muscle spasms.  Dispense: 60 each; Refill: 3  2. Essential hypertension Continue current medications. Home BP Ok per patient.

## 2015-10-28 DIAGNOSIS — F333 Major depressive disorder, recurrent, severe with psychotic symptoms: Secondary | ICD-10-CM | POA: Diagnosis not present

## 2015-10-30 DIAGNOSIS — R293 Abnormal posture: Secondary | ICD-10-CM | POA: Diagnosis not present

## 2015-10-30 DIAGNOSIS — M25512 Pain in left shoulder: Secondary | ICD-10-CM | POA: Diagnosis not present

## 2015-11-04 DIAGNOSIS — M25512 Pain in left shoulder: Secondary | ICD-10-CM | POA: Diagnosis not present

## 2015-11-04 DIAGNOSIS — R293 Abnormal posture: Secondary | ICD-10-CM | POA: Diagnosis not present

## 2015-11-09 DIAGNOSIS — F333 Major depressive disorder, recurrent, severe with psychotic symptoms: Secondary | ICD-10-CM | POA: Diagnosis not present

## 2015-11-10 DIAGNOSIS — R293 Abnormal posture: Secondary | ICD-10-CM | POA: Diagnosis not present

## 2015-11-10 DIAGNOSIS — M25512 Pain in left shoulder: Secondary | ICD-10-CM | POA: Diagnosis not present

## 2015-11-24 ENCOUNTER — Other Ambulatory Visit: Payer: Self-pay | Admitting: Internal Medicine

## 2015-11-24 DIAGNOSIS — I10 Essential (primary) hypertension: Secondary | ICD-10-CM

## 2015-11-24 DIAGNOSIS — R293 Abnormal posture: Secondary | ICD-10-CM | POA: Diagnosis not present

## 2015-11-24 DIAGNOSIS — M25512 Pain in left shoulder: Secondary | ICD-10-CM | POA: Diagnosis not present

## 2015-11-24 MED ORDER — NIFEDIPINE ER OSMOTIC RELEASE 60 MG PO TB24: ORAL_TABLET | ORAL | Status: DC

## 2015-11-26 ENCOUNTER — Other Ambulatory Visit: Payer: Self-pay

## 2015-11-26 DIAGNOSIS — M25552 Pain in left hip: Secondary | ICD-10-CM | POA: Diagnosis not present

## 2015-11-26 DIAGNOSIS — R293 Abnormal posture: Secondary | ICD-10-CM | POA: Diagnosis not present

## 2015-11-26 DIAGNOSIS — I10 Essential (primary) hypertension: Secondary | ICD-10-CM | POA: Diagnosis not present

## 2015-11-26 DIAGNOSIS — M25512 Pain in left shoulder: Secondary | ICD-10-CM | POA: Diagnosis not present

## 2015-11-26 LAB — BASIC METABOLIC PANEL
BUN: 18 mg/dL (ref 4–21)
Creatinine: 0.9 mg/dL (ref 0.5–1.1)
GLUCOSE: 91 mg/dL
Potassium: 5.1 mmol/L (ref 3.4–5.3)
SODIUM: 141 mmol/L (ref 137–147)

## 2015-11-26 LAB — LIPID PANEL
Cholesterol: 162 mg/dL (ref 0–200)
HDL: 65 mg/dL (ref 35–70)
LDL CALC: 81 mg/dL
Triglycerides: 81 mg/dL (ref 40–160)

## 2015-11-26 LAB — HEPATIC FUNCTION PANEL
ALK PHOS: 58 U/L (ref 25–125)
ALT: 16 U/L (ref 7–35)
AST: 21 U/L (ref 13–35)
BILIRUBIN, TOTAL: 0.3 mg/dL

## 2015-11-30 DIAGNOSIS — R293 Abnormal posture: Secondary | ICD-10-CM | POA: Diagnosis not present

## 2015-11-30 DIAGNOSIS — M25512 Pain in left shoulder: Secondary | ICD-10-CM | POA: Diagnosis not present

## 2015-12-01 ENCOUNTER — Non-Acute Institutional Stay: Payer: Medicare Other | Admitting: Internal Medicine

## 2015-12-01 ENCOUNTER — Encounter: Payer: Self-pay | Admitting: Internal Medicine

## 2015-12-01 VITALS — BP 148/78 | HR 79 | Temp 97.8°F | Ht 62.0 in | Wt 149.0 lb

## 2015-12-01 DIAGNOSIS — F329 Major depressive disorder, single episode, unspecified: Secondary | ICD-10-CM

## 2015-12-01 DIAGNOSIS — F32A Depression, unspecified: Secondary | ICD-10-CM

## 2015-12-01 DIAGNOSIS — R739 Hyperglycemia, unspecified: Secondary | ICD-10-CM | POA: Diagnosis not present

## 2015-12-01 DIAGNOSIS — R251 Tremor, unspecified: Secondary | ICD-10-CM | POA: Diagnosis not present

## 2015-12-01 DIAGNOSIS — I10 Essential (primary) hypertension: Secondary | ICD-10-CM

## 2015-12-01 DIAGNOSIS — E7800 Pure hypercholesterolemia, unspecified: Secondary | ICD-10-CM

## 2015-12-01 NOTE — Progress Notes (Signed)
Patient ID: [facility], female   DOB: [birth year], 82 y.o.   MRN: [id number]    Facility      Place of Service: [facility] ([facility]     Allergies  Allergen Reactions  . Penicillins Other (See Comments)    CHILDHOOD REACTION    Chief Complaint  Patient presents with  . Medical Management of Chronic Issues    6 month medication management blood pressure, tremor, depression, cholersterol, hyperglycemia, review labs.    HPI:  Last seen 10/20/15 with flu like symptoms. Fully resolved.  Getting therapy for discomfort in the left arm and in the shoulders.  Essential hypertension - mild elevation in the SBP today. Recently was 104/ 58.  Tremor - unchanged  Depression - stable  High cholesterol - controlled  Hyperglycemia - recently normal.  Depression - continues to see psychiatrist. Recently had Effexor increased. She says it has helped.  Medications: Patient's Medications  New Prescriptions   No medications on file  Previous Medications   ALBUTEROL (PROVENTIL HFA;VENTOLIN HFA) 108 (90 BASE) MCG/ACT INHALER    Inhale 2 puffs into the lungs every 4 (four) hours as needed for wheezing or shortness of breath (cough, shortness of breath or wheezing.).   ASPIRIN 81 MG TABLET    Take 81 mg by mouth daily.   BACLOFEN (LIORESAL) 10 MG TABLET    Take 0.5 tablets (5 mg total) by mouth 2 (two) times daily as needed for muscle spasms.   BUPROPION (WELLBUTRIN XL) 150 MG 24 HR TABLET    Take 450 mg by mouth daily.   CALCIUM CARBONATE-VITAMIN D (CALCIUM 600+D3 PO)    Take by mouth. Take 3 daily   CHLORPHENIRAMINE-HYDROCODONE (TUSSIONEX PENNKINETIC ER) 10-8 MG/5ML LQCR    Take one tsp every 12 hours if needed to control  cough   CHOLECALCIFEROL (VITAMIN D) 1000 UNITS TABLET    Take 1,000 Units by mouth daily.   CLONAZEPAM (KLONOPIN) 0.5 MG TABLET    Take 0.5 mg by mouth. Take one half  tablet in morning one tablet in evening as needed for anxiety   GLUCOSAMINE HCL 1000 MG TABS    Take 2,000 mg  by mouth 2 (two) times daily.   IBANDRONATE (BONIVA) 150 MG TABLET    Take 1 tablet (150 mg total) by mouth every 30 (thirty) days. Take in the morning with a full glass of water, on an empty stomach, and do not take anything else by mouth or lie down for the next 30 min.   MELOXICAM (MOBIC) 7.5 MG TABLET    One daily to help arthritis   MULTIPLE VITAMIN (MULTIVITAMIN WITH MINERALS) TABS    Take 1 tablet by mouth daily.   NIFEDIPINE (PROCARDIA XL/ADALAT-CC) 60 MG 24 HR TABLET    One daily to control BP   PANTOPRAZOLE (PROTONIX) 40 MG TABLET    TAKE ONE TABLET BY MOUTH ONCE DAILY FOR STOMACH   POLYETHYL GLYCOL-PROPYL GLYCOL (SYSTANE) 0.4-0.3 % SOLN    Place 1 drop into both eyes 2 (two) times daily.    PRAVASTATIN (PRAVACHOL) 20 MG TABLET    Take one tablet by mouth once daily for cholesterol   PROBIOTIC PRODUCT (PROBIOTIC PO)    Take 1 tablet by mouth daily.   VENLAFAXINE XR (EFFEXOR-XR) 150 MG 24 HR CAPSULE    Take 150 mg by mouth every morning.   VENLAFAXINE XR (EFFEXOR-XR) 75 MG 24 HR CAPSULE    TAKE 1 CAPSULE BY MOUTH EVERY MORNING (IN ADDITION TO 150MG)  Modified Medications   No medications on file  Discontinued Medications   POLYETHYLENE GLYCOL POWDER (GLYCOLAX/MIRALAX) POWDER    Take 17 g by mouth 2 (two) times daily as needed.     Review of Systems  Constitutional: Negative.  Negative for unexpected weight change.  HENT: Positive for hearing loss. Negative for ear pain.   Eyes: Positive for visual disturbance (corrective lenses).  Respiratory: Positive for cough.   Cardiovascular:       Aortic ejection murmur noted 04/20/14.  Gastrointestinal: Positive for abdominal pain (Left lower quadrant).       Heartburn. Constipation. History of hiatal hernia with 1/2 stomach in the chest. Impaction of meat in the esophagus 01/24/14. EGD with dilation of esophageal stricture by Dr. Ardis Hughs on 04/04/14.  Left inguinal hernia on 04/20/14.  Endocrine: Negative for polydipsia, polyphagia and  polyuria.       History of mild hyperglycemia in September 2015 and June 2016.  Genitourinary: Negative for hematuria.       Nocturia x5-6. Increased frequency in the daytime. Urge incontinence. Hx urinary tract infection. Vaginal atrophy and stenosis  Musculoskeletal: Positive for neck pain and neck stiffness.       Chronic mild back discomfort. Some joint pains. Pain in the left scapular area inferiorly below the scapular spine. Shoulder is crepitant, but ROM is unimpaired.  Skin: Negative for rash.  Allergic/Immunologic: Negative.   Neurological:       11/18/14 MMSE 30/30. Passed clock drawing.  Hematological: Negative.   Psychiatric/Behavioral: Positive for sleep disturbance and dysphoric mood. The patient is nervous/anxious.        Insomnia.Danley Danker Vitals:   12/01/15 0916  BP: 148/78  Pulse: 79  Temp: 97.8 F (36.6 C)  TempSrc: Oral  Height: 5' 2"  (1.575 m)  Weight: 149 lb (67.586 kg)  SpO2: 99%   Wt Readings from Last 3 Encounters:  10/20/15 145 lb (65.772 kg)  09/01/15 150 lb (68.04 kg)  08/19/15 139 lb (63.05 kg)    Body mass index is 27.25 kg/(m^2).  Physical Exam  Constitutional: She is oriented to person, place, and time. She appears well-developed and well-nourished. No distress.  HENT:  Head: Normocephalic.  Right Ear: External ear normal.  Left Ear: External ear normal.  Nose: Nose normal.  Mouth/Throat: Oropharynx is clear and moist.   Mild hearing loss bilaterally.  Eyes:  Wears corrective lenses. Left cataract present. Right pupil seems to be nonreactive to light. There are 2 white cystic areas on the left upper eyelid medially. 2 additional areas are present on the left lower lid centrally.  Neck: Neck supple. No JVD present. No tracheal deviation present. No thyromegaly present.  Cardiovascular: Normal rate and regular rhythm.  Exam reveals no gallop and no friction rub.   No murmur heard. 3/6 aortic ejection murmur with radiation towards left  sternal border as well.  Diminished pulsation at right popliteal, right posterior tibial, and right dorsalis pedis.  Pulmonary/Chest: Breath sounds normal. No respiratory distress. She has no wheezes. She has no rales. She exhibits no tenderness.  Cough and bronchial rattle  Abdominal: She exhibits no distension and no mass. There is no tenderness.  Not tender in the left groin or left lower quadrant. Small bulge in the left groin.  Genitourinary: Guaiac negative stool. No vaginal discharge found.  Prior exam:  approximately 1-1/2 inches into the vagina, there is a thin tissue band at is circumferential and narrows the vagina. I'm unable to penetrate through the  band with my index finger. There is no tenderness, bleeding, or other excessive tissue growth in this area.  Musculoskeletal: She exhibits edema.  Flat feet with collapsed arches. Unstable gait. Using cane. Pain in the left scapular area under the scapular spine.  Neurological: She is alert and oriented to person, place, and time. She has normal reflexes. No cranial nerve deficit. Coordination normal.  Skin: No rash noted. No erythema. No pallor.  Multiple seborrheic keratoses.  Psychiatric: She has a normal mood and affect. Her behavior is normal. Judgment and thought content normal.     Labs reviewed: Lab Summary Latest Ref Rng 11/26/2015 05/21/2015 11/10/2014  Hemoglobin 12.2-16.2 g/dL (None) (None) (None)  Hematocrit 37.7-47.9 % (None) (None) (None)  White count 4.6-10.2 K/uL (None) (None) (None)  Platelet count - (None) (None) (None)  Sodium 137 - 147 mmol/L 141 140 142  Potassium 3.4 - 5.3 mmol/L 5.1 4.8 4.6  Calcium - (None) (None) (None)  Phosphorus - (None) (None) (None)  Creatinine 0.5 - 1.1 mg/dL 0.9 0.9 0.9  AST 13 - 35 U/L 21 25 22   Alk Phos 25 - 125 U/L 58 60 58  Bilirubin - (None) (None) (None)  Glucose - 91 91 109  Cholesterol 0 - 200 mg/dL 162 159 163  HDL cholesterol 35 - 70 mg/dL 65 59 59  Triglycerides 40  - 160 mg/dL 81 89 90  LDL Direct - (None) (None) (None)  LDL Calc - 81 82 86  Total protein - (None) (None) (None)  Albumin - (None) (None) (None)   Lab Results  Component Value Date   TSH 2.90 05/21/2015   Lab Results  Component Value Date   BUN 18 11/26/2015   BUN 15 05/21/2015   BUN 16 11/10/2014   Lab Results  Component Value Date   CREATININE 0.9 11/26/2015   CREATININE 0.9 05/21/2015   CREATININE 0.9 11/10/2014   No results found for: HGBA1C     Assessment/Plan  1. Essential hypertension Mild elevation in SBP today, but will follow.  2. Tremor Unchanged  3. Depression Improved on current medication  4. High cholesterol Controlled  5. Hyperglycemia Normal in most recent lab work

## 2015-12-02 DIAGNOSIS — R293 Abnormal posture: Secondary | ICD-10-CM | POA: Diagnosis not present

## 2015-12-02 DIAGNOSIS — M25512 Pain in left shoulder: Secondary | ICD-10-CM | POA: Diagnosis not present

## 2015-12-07 DIAGNOSIS — M25512 Pain in left shoulder: Secondary | ICD-10-CM | POA: Diagnosis not present

## 2015-12-07 DIAGNOSIS — R293 Abnormal posture: Secondary | ICD-10-CM | POA: Diagnosis not present

## 2015-12-08 ENCOUNTER — Encounter: Payer: Self-pay | Admitting: Internal Medicine

## 2015-12-08 DIAGNOSIS — Z1231 Encounter for screening mammogram for malignant neoplasm of breast: Secondary | ICD-10-CM | POA: Diagnosis not present

## 2015-12-09 DIAGNOSIS — M25512 Pain in left shoulder: Secondary | ICD-10-CM | POA: Diagnosis not present

## 2015-12-09 DIAGNOSIS — R293 Abnormal posture: Secondary | ICD-10-CM | POA: Diagnosis not present

## 2015-12-14 DIAGNOSIS — R293 Abnormal posture: Secondary | ICD-10-CM | POA: Diagnosis not present

## 2015-12-14 DIAGNOSIS — M25512 Pain in left shoulder: Secondary | ICD-10-CM | POA: Diagnosis not present

## 2015-12-18 DIAGNOSIS — M25512 Pain in left shoulder: Secondary | ICD-10-CM | POA: Diagnosis not present

## 2015-12-18 DIAGNOSIS — M25511 Pain in right shoulder: Secondary | ICD-10-CM | POA: Diagnosis not present

## 2015-12-21 DIAGNOSIS — M25512 Pain in left shoulder: Secondary | ICD-10-CM | POA: Diagnosis not present

## 2015-12-21 DIAGNOSIS — M25511 Pain in right shoulder: Secondary | ICD-10-CM | POA: Diagnosis not present

## 2015-12-25 ENCOUNTER — Encounter: Payer: Self-pay | Admitting: Internal Medicine

## 2015-12-25 DIAGNOSIS — M25512 Pain in left shoulder: Secondary | ICD-10-CM | POA: Diagnosis not present

## 2015-12-25 DIAGNOSIS — M25511 Pain in right shoulder: Secondary | ICD-10-CM | POA: Diagnosis not present

## 2015-12-30 DIAGNOSIS — H10413 Chronic giant papillary conjunctivitis, bilateral: Secondary | ICD-10-CM | POA: Diagnosis not present

## 2015-12-30 DIAGNOSIS — H04123 Dry eye syndrome of bilateral lacrimal glands: Secondary | ICD-10-CM | POA: Diagnosis not present

## 2015-12-30 DIAGNOSIS — H02824 Cysts of left upper eyelid: Secondary | ICD-10-CM | POA: Diagnosis not present

## 2015-12-30 DIAGNOSIS — Z961 Presence of intraocular lens: Secondary | ICD-10-CM | POA: Diagnosis not present

## 2015-12-30 DIAGNOSIS — H40053 Ocular hypertension, bilateral: Secondary | ICD-10-CM | POA: Diagnosis not present

## 2016-01-01 DIAGNOSIS — M25511 Pain in right shoulder: Secondary | ICD-10-CM | POA: Diagnosis not present

## 2016-01-01 DIAGNOSIS — M25512 Pain in left shoulder: Secondary | ICD-10-CM | POA: Diagnosis not present

## 2016-01-07 DIAGNOSIS — M25511 Pain in right shoulder: Secondary | ICD-10-CM | POA: Diagnosis not present

## 2016-01-07 DIAGNOSIS — M25512 Pain in left shoulder: Secondary | ICD-10-CM | POA: Diagnosis not present

## 2016-01-27 DIAGNOSIS — M19011 Primary osteoarthritis, right shoulder: Secondary | ICD-10-CM | POA: Diagnosis not present

## 2016-01-27 DIAGNOSIS — M12811 Other specific arthropathies, not elsewhere classified, right shoulder: Secondary | ICD-10-CM | POA: Diagnosis not present

## 2016-01-27 DIAGNOSIS — M7582 Other shoulder lesions, left shoulder: Secondary | ICD-10-CM | POA: Diagnosis not present

## 2016-02-05 DIAGNOSIS — M25512 Pain in left shoulder: Secondary | ICD-10-CM | POA: Diagnosis not present

## 2016-02-05 DIAGNOSIS — M25521 Pain in right elbow: Secondary | ICD-10-CM | POA: Diagnosis not present

## 2016-02-05 DIAGNOSIS — M25511 Pain in right shoulder: Secondary | ICD-10-CM | POA: Diagnosis not present

## 2016-02-09 DIAGNOSIS — M25521 Pain in right elbow: Secondary | ICD-10-CM | POA: Diagnosis not present

## 2016-02-09 DIAGNOSIS — M25512 Pain in left shoulder: Secondary | ICD-10-CM | POA: Diagnosis not present

## 2016-02-09 DIAGNOSIS — M25511 Pain in right shoulder: Secondary | ICD-10-CM | POA: Diagnosis not present

## 2016-02-19 DIAGNOSIS — M25512 Pain in left shoulder: Secondary | ICD-10-CM | POA: Diagnosis not present

## 2016-02-19 DIAGNOSIS — M25511 Pain in right shoulder: Secondary | ICD-10-CM | POA: Diagnosis not present

## 2016-02-19 DIAGNOSIS — M25521 Pain in right elbow: Secondary | ICD-10-CM | POA: Diagnosis not present

## 2016-02-23 DIAGNOSIS — M25521 Pain in right elbow: Secondary | ICD-10-CM | POA: Diagnosis not present

## 2016-02-23 DIAGNOSIS — M25512 Pain in left shoulder: Secondary | ICD-10-CM | POA: Diagnosis not present

## 2016-02-23 DIAGNOSIS — M25511 Pain in right shoulder: Secondary | ICD-10-CM | POA: Diagnosis not present

## 2016-02-25 DIAGNOSIS — Z23 Encounter for immunization: Secondary | ICD-10-CM | POA: Diagnosis not present

## 2016-03-01 DIAGNOSIS — M25512 Pain in left shoulder: Secondary | ICD-10-CM | POA: Diagnosis not present

## 2016-03-01 DIAGNOSIS — M25511 Pain in right shoulder: Secondary | ICD-10-CM | POA: Diagnosis not present

## 2016-03-01 DIAGNOSIS — M25521 Pain in right elbow: Secondary | ICD-10-CM | POA: Diagnosis not present

## 2016-03-04 ENCOUNTER — Ambulatory Visit (INDEPENDENT_AMBULATORY_CARE_PROVIDER_SITE_OTHER): Payer: Medicare Other | Admitting: Specialist

## 2016-03-04 DIAGNOSIS — M7702 Medial epicondylitis, left elbow: Secondary | ICD-10-CM | POA: Diagnosis not present

## 2016-03-04 DIAGNOSIS — M25511 Pain in right shoulder: Secondary | ICD-10-CM | POA: Diagnosis not present

## 2016-03-04 DIAGNOSIS — M545 Low back pain: Secondary | ICD-10-CM | POA: Diagnosis not present

## 2016-03-04 DIAGNOSIS — M25512 Pain in left shoulder: Secondary | ICD-10-CM | POA: Diagnosis not present

## 2016-03-04 DIAGNOSIS — M25521 Pain in right elbow: Secondary | ICD-10-CM | POA: Diagnosis not present

## 2016-03-05 ENCOUNTER — Other Ambulatory Visit: Payer: Self-pay | Admitting: Internal Medicine

## 2016-03-08 DIAGNOSIS — M25511 Pain in right shoulder: Secondary | ICD-10-CM | POA: Diagnosis not present

## 2016-03-08 DIAGNOSIS — M25521 Pain in right elbow: Secondary | ICD-10-CM | POA: Diagnosis not present

## 2016-03-08 DIAGNOSIS — M25512 Pain in left shoulder: Secondary | ICD-10-CM | POA: Diagnosis not present

## 2016-03-09 ENCOUNTER — Other Ambulatory Visit: Payer: Self-pay

## 2016-03-09 DIAGNOSIS — E7800 Pure hypercholesterolemia, unspecified: Secondary | ICD-10-CM

## 2016-03-09 DIAGNOSIS — I10 Essential (primary) hypertension: Secondary | ICD-10-CM

## 2016-03-09 DIAGNOSIS — R739 Hyperglycemia, unspecified: Secondary | ICD-10-CM

## 2016-03-09 NOTE — Addendum Note (Signed)
Addended by: Logan Bores on: 03/09/2016 10:22 AM   Modules accepted: Orders

## 2016-03-11 DIAGNOSIS — M25512 Pain in left shoulder: Secondary | ICD-10-CM | POA: Diagnosis not present

## 2016-03-11 DIAGNOSIS — M25511 Pain in right shoulder: Secondary | ICD-10-CM | POA: Diagnosis not present

## 2016-03-11 DIAGNOSIS — M25521 Pain in right elbow: Secondary | ICD-10-CM | POA: Diagnosis not present

## 2016-03-15 DIAGNOSIS — M25512 Pain in left shoulder: Secondary | ICD-10-CM | POA: Diagnosis not present

## 2016-03-15 DIAGNOSIS — M25511 Pain in right shoulder: Secondary | ICD-10-CM | POA: Diagnosis not present

## 2016-03-15 DIAGNOSIS — M25521 Pain in right elbow: Secondary | ICD-10-CM | POA: Diagnosis not present

## 2016-03-18 DIAGNOSIS — M25521 Pain in right elbow: Secondary | ICD-10-CM | POA: Diagnosis not present

## 2016-03-18 DIAGNOSIS — M25512 Pain in left shoulder: Secondary | ICD-10-CM | POA: Diagnosis not present

## 2016-03-18 DIAGNOSIS — M25511 Pain in right shoulder: Secondary | ICD-10-CM | POA: Diagnosis not present

## 2016-03-22 DIAGNOSIS — M25512 Pain in left shoulder: Secondary | ICD-10-CM | POA: Diagnosis not present

## 2016-03-22 DIAGNOSIS — M25521 Pain in right elbow: Secondary | ICD-10-CM | POA: Diagnosis not present

## 2016-03-22 DIAGNOSIS — M25511 Pain in right shoulder: Secondary | ICD-10-CM | POA: Diagnosis not present

## 2016-03-25 DIAGNOSIS — M25521 Pain in right elbow: Secondary | ICD-10-CM | POA: Diagnosis not present

## 2016-03-25 DIAGNOSIS — M25511 Pain in right shoulder: Secondary | ICD-10-CM | POA: Diagnosis not present

## 2016-03-25 DIAGNOSIS — M25512 Pain in left shoulder: Secondary | ICD-10-CM | POA: Diagnosis not present

## 2016-03-28 DIAGNOSIS — F333 Major depressive disorder, recurrent, severe with psychotic symptoms: Secondary | ICD-10-CM | POA: Diagnosis not present

## 2016-04-01 DIAGNOSIS — M25512 Pain in left shoulder: Secondary | ICD-10-CM | POA: Diagnosis not present

## 2016-04-01 DIAGNOSIS — M25511 Pain in right shoulder: Secondary | ICD-10-CM | POA: Diagnosis not present

## 2016-04-01 DIAGNOSIS — M25521 Pain in right elbow: Secondary | ICD-10-CM | POA: Diagnosis not present

## 2016-04-15 DIAGNOSIS — M25521 Pain in right elbow: Secondary | ICD-10-CM | POA: Diagnosis not present

## 2016-04-15 DIAGNOSIS — M25512 Pain in left shoulder: Secondary | ICD-10-CM | POA: Diagnosis not present

## 2016-04-15 DIAGNOSIS — M25511 Pain in right shoulder: Secondary | ICD-10-CM | POA: Diagnosis not present

## 2016-04-18 ENCOUNTER — Encounter (INDEPENDENT_AMBULATORY_CARE_PROVIDER_SITE_OTHER): Payer: Self-pay | Admitting: Specialist

## 2016-04-18 ENCOUNTER — Encounter (INDEPENDENT_AMBULATORY_CARE_PROVIDER_SITE_OTHER): Payer: Self-pay

## 2016-04-18 ENCOUNTER — Ambulatory Visit (INDEPENDENT_AMBULATORY_CARE_PROVIDER_SITE_OTHER): Payer: Medicare Other | Admitting: Specialist

## 2016-04-18 VITALS — Ht 62.0 in | Wt 148.0 lb

## 2016-04-18 DIAGNOSIS — M65232 Calcific tendinitis, left forearm: Secondary | ICD-10-CM | POA: Diagnosis not present

## 2016-04-18 DIAGNOSIS — M4125 Other idiopathic scoliosis, thoracolumbar region: Secondary | ICD-10-CM | POA: Diagnosis not present

## 2016-04-18 DIAGNOSIS — M19172 Post-traumatic osteoarthritis, left ankle and foot: Secondary | ICD-10-CM | POA: Diagnosis not present

## 2016-04-18 DIAGNOSIS — M898X1 Other specified disorders of bone, shoulder: Secondary | ICD-10-CM | POA: Diagnosis not present

## 2016-04-18 DIAGNOSIS — M810 Age-related osteoporosis without current pathological fracture: Secondary | ICD-10-CM | POA: Diagnosis not present

## 2016-04-18 MED ORDER — BACLOFEN 10 MG PO TABS: ORAL_TABLET | ORAL | 3 refills | Status: DC

## 2016-04-18 MED ORDER — DICLOFENAC SODIUM 1 % TD GEL: 2.0000 g | Freq: Four times a day (QID) | TRANSDERMAL | 3 refills | Status: DC

## 2016-04-18 NOTE — Patient Instructions (Signed)
Avoid bending, stooping and avoid lifting weights greater than 10 lbs. Avoid prolong standing and walking. Avoid frequent bending and stooping  No lifting greater than 10 lbs. May use ice or moist heat for pain. Weight loss is of benefit. Handicap license is approved.  

## 2016-04-18 NOTE — Progress Notes (Signed)
Office Visit Note   Patient: [facility]           Date of Birth: April 06, 1926           MRN: [id number] Visit Date: 04/18/2016              Requested by: Estill Dooms, MD [address] [place], [place] [address] PCP: Jeanmarie Hubert, MD   Assessment & Plan: Visit Diagnoses:  1. Calcific tendonitis of forearm, left   2. Pain of left scapula   3. Post-traumatic osteoarthritis, left ankle and foot   4. Other idiopathic scoliosis, thoracolumbar region   5. Age-related osteoporosis without current pathological fracture     Plan:Avoid bending, stooping and avoid lifting weights greater than 10 lbs. Avoid prolong standing and walking. Avoid frequent bending and stooping  No lifting greater than 10 lbs. May use ice or moist heat for pain. Weight loss is of benefit. Handicap license is approved.   Follow-Up Instructions: Return in about 4 months (around 08/17/2016) for follow up of lumbar spondylosis.   Orders:  No orders of the defined types were placed in this encounter.  Meds ordered this encounter  Medications  . baclofen (LIORESAL) 10 MG tablet    Sig: Take 2 tablets po BID.    Dispense:  120 tablet    Refill:  3  . diclofenac sodium (VOLTAREN) 1 % GEL    Sig: Apply 2 g topically 4 (four) times daily.    Dispense:  3 Tube    Refill:  3      Procedures: No procedures performed   Clinical Data: No additional findings.   Subjective: Chief Complaint  Patient presents with  . Right Shoulder - Pain, Follow-up  . Left Shoulder - Pain, Follow-up  . Left Elbow - Pain  . Right Ankle - Pain    Patient returns today to follow up on bilateral shoulders. She states her shoulders are better "most of the time." Here today mainly due to left arm pain between elbow and wrist.Feels this is tendonitis. Pain is not constant. Also having pain in left ankle joint. States she only has pain with walking. A little tender to touch. Patient questions if this is arthritis. No  injury. Ambulates with walker.  Has intermittant weakness in the right arm when going to lift the arm upwards, improves with swinging the right arm and then is able to raise it. Left forearm with volar pain that is present and she feels if due to tendonitis, no numbness or paresthesias. Also with complaints of left ankle pain that is worse with standing and motion of the left ankle.    Review of Systems  HENT: Negative.   Eyes: Positive for visual disturbance.  Respiratory: Negative.   Cardiovascular: Negative.   Gastrointestinal: Negative.   Endocrine: Negative.   Genitourinary: Negative.   Musculoskeletal: Positive for arthralgias, back pain, joint swelling, myalgias and neck pain.  Allergic/Immunologic: Negative.   Neurological: Positive for weakness.  Hematological: Negative.   Psychiatric/Behavioral: Negative.      Objective: Vital Signs: Ht 5\' 2"  (1.575 m)   Wt 148 lb (67.1 kg)   BMI 27.07 kg/m   Physical Exam  Ortho Exam  Specialty Comments:  No specialty comments available.  Imaging: No results found.   PMFS History: Patient Active Problem List   Diagnosis Date Noted  . Pain of left scapula 10/20/2015  . Influenza 09/01/2015  . Hip pain 06/02/2015  . Tremor 06/02/2015  . Pain, abdominal,  LLQ 02/03/2015  . Vaginal stenosis 11/18/2014  . PAOD (peripheral arterial occlusive disease) ([place]) 11/18/2014  . Osteoporosis, senile 11/18/2014  . Hyperglycemia 11/18/2014  . Left inguinal hernia 04/24/2014  . Diverticulosis of colon with hemorrhage 04/24/2014  . Aortic ejection murmur 04/24/2014  . Dysphagia 01/24/2014  . Esophageal stricture 01/24/2014  . Cervicalgia 08/20/2013  . Hip fracture ([place]) 11/07/2011  . GERD (gastroesophageal reflux disease)   . Hypertension   . High cholesterol   . Hiatal hernia   . Scoliosis   . DDD (degenerative disc disease)   . Depression   . Senile osteoporosis 12/14/2004  . Postmenopausal atrophic vaginitis 12/14/2004  .  Edema 11/26/2004  . Urinary incontinence 11/26/2004   Past Medical History:  Diagnosis Date  . Abnormal mammogram, unspecified 04/19/2011  . Allergy   . Anxiety   . Backache, unspecified 01/21/2005  . Cervicalgia 08/20/2013  . Chronic kidney disease, stage II (mild) 04/03/2009  . Chronic rhinitis 09/28/2010  . DDD (degenerative disc disease)   . Depression   . Dermatophytosis of nail 01/21/2005  . Diverticulosis of colon (without mention of hemorrhage) 06/17/2005  . Edema 11/26/2004  . GERD (gastroesophageal reflux disease)   . Hiatal hernia   . High cholesterol   . Hyperglycemia 11/18/2014  . Hypertension   . Insomnia, unspecified 10/18/2011  . Left inguinal hernia 04/24/2014  . Nocturia 09/19/2008  . Osteoporosis, senile 11/18/2014  . Other abnormal blood chemistry 04/03/2007  . Pain in joint, pelvic region and thigh 11/12/2011  . Pain in joint, shoulder region 08/15/2006  . Pain in limb 08/26/2005  . Palpitations 09/29/2009  . PAOD (peripheral arterial occlusive disease) ([place]) 11/18/2014   Right leg; diminished popliteal, dorsalis pedis, and posterior tibial artery pulsations   . Postmenopausal atrophic vaginitis 12/14/2004  . Scoliosis   . Senile osteoporosis 12/14/2004  . Tension headache 10/04/2005  . Unspecified cataract 11/26/2004  . Unspecified constipation 01/21/2005  . Unspecified urinary incontinence 11/26/2004  . Vaginal stenosis 11/18/2014   Tight band about 1/2 inches into the vagina which will not allow passage of my index finger.     Family History  Problem Relation Age of Onset  . Heart disease Mother   . Stroke Father   . Heart disease Brother   . Esophageal cancer Neg Hx   . Stomach cancer Neg Hx   . Colon cancer Neg Hx     Past Surgical History:  Procedure Laterality Date  . ABDOMINAL HYSTERECTOMY  1969   Dr.Braun   . bilateral eyelid surgery  2005   Dr.Scott  . BILATERAL OOPHORECTOMY  2002  . BREAST SURGERY     benign tumor removal, left breast 1969  . CATARACT  EXTRACTION     Right  . CHOLECYSTECTOMY  1990   Dr.Moore   . COLONOSCOPY  3/18/20008   inflammation, diverticular associated colitis -Dr. Ardis Hughs  . COSMETIC SURGERY    . ESOPHAGOGASTRODUODENOSCOPY N/A 01/24/2014   Procedure: ESOPHAGOGASTRODUODENOSCOPY (EGD);  Surgeon: Irene Shipper, MD;  Location: [facility] ENDOSCOPY;  Service: Endoscopy;  Laterality: N/A;  . ESOPHAGOGASTRODUODENOSCOPY ENDOSCOPY  04/04/14   Dr. Ardis Hughs  . EYE SURGERY    . FOREIGN BODY REMOVAL N/A 01/24/2014   Procedure: FOREIGN BODY REMOVAL;  Surgeon: Irene Shipper, MD;  Location: [facility];  Service: Endoscopy;  Laterality: N/A;  . HIP PINNING,CANNULATED  11/07/2011   Procedure: CANNULATED HIP PINNING;  Surgeon: Jessy Oto, MD;  Location: [facility];  Service: Orthopedics;  Laterality: Left;  . ORIF  FEMORAL NECK FRACTURE W/ [facility]  11/07/2011   Dr.Chelbie Jarnagin   . TONSILLECTOMY     Dr.Joe Tamala Julian  . TUBAL LIGATION    . VAGINAL PROLAPSE REPAIR  2002   Dr.Horback    Social History   Occupational History  . retired Producer, television/film/video Retired   Social History Main Topics  . Smoking status: Never Smoker  . Smokeless tobacco: Never Used  . Alcohol use 4.2 oz/week    7 Glasses of wine per week     Comment: one glass of wine in evening  . Drug use: No  . Sexual activity: No

## 2016-04-20 DIAGNOSIS — M25512 Pain in left shoulder: Secondary | ICD-10-CM | POA: Diagnosis not present

## 2016-04-20 DIAGNOSIS — M25521 Pain in right elbow: Secondary | ICD-10-CM | POA: Diagnosis not present

## 2016-04-20 DIAGNOSIS — M25511 Pain in right shoulder: Secondary | ICD-10-CM | POA: Diagnosis not present

## 2016-04-21 ENCOUNTER — Telehealth (INDEPENDENT_AMBULATORY_CARE_PROVIDER_SITE_OTHER): Payer: Self-pay | Admitting: Specialist

## 2016-04-21 NOTE — Telephone Encounter (Signed)
FYI: See message below 

## 2016-04-21 NOTE — Telephone Encounter (Signed)
It is okay for her to discontinue, it was only for comfort, if it causes more trouble than it helps then it should be discontinued. jen

## 2016-04-21 NOTE — Telephone Encounter (Signed)
Pt called asking for advice on medication. She stated that the medication she was prescribed (baclosen) caused dizziness and sleepness and stated that she has other medications that also cause these symptoms and said she would not be able to take the baclosen, that it would be too much for her. Please call pt back at [phone]

## 2016-04-22 DIAGNOSIS — M25512 Pain in left shoulder: Secondary | ICD-10-CM | POA: Diagnosis not present

## 2016-04-22 DIAGNOSIS — M25511 Pain in right shoulder: Secondary | ICD-10-CM | POA: Diagnosis not present

## 2016-04-22 DIAGNOSIS — M25521 Pain in right elbow: Secondary | ICD-10-CM | POA: Diagnosis not present

## 2016-04-22 NOTE — Telephone Encounter (Signed)
Called patient and left vm advising ok to discontinue

## 2016-04-26 DIAGNOSIS — M25521 Pain in right elbow: Secondary | ICD-10-CM | POA: Diagnosis not present

## 2016-04-26 DIAGNOSIS — M25511 Pain in right shoulder: Secondary | ICD-10-CM | POA: Diagnosis not present

## 2016-04-26 DIAGNOSIS — M25512 Pain in left shoulder: Secondary | ICD-10-CM | POA: Diagnosis not present

## 2016-04-27 ENCOUNTER — Ambulatory Visit (INDEPENDENT_AMBULATORY_CARE_PROVIDER_SITE_OTHER): Payer: Medicare Other | Admitting: Family

## 2016-04-27 DIAGNOSIS — S93499A Sprain of other ligament of unspecified ankle, initial encounter: Secondary | ICD-10-CM | POA: Insufficient documentation

## 2016-04-27 DIAGNOSIS — S93492A Sprain of other ligament of left ankle, initial encounter: Secondary | ICD-10-CM

## 2016-04-27 NOTE — Progress Notes (Signed)
Office Visit Note   Patient: Tina [facility]           Date of Birth: 02-15-1926           MRN: [id number] Visit Date: 04/27/2016              Requested by: Estill Dooms, MD [address] [place], [place] [address] PCP: Jeanmarie Hubert, MD   Assessment & Plan: Visit Diagnoses:  1. Sprain of anterior talofibular ligament of left ankle, initial encounter     Plan: Have provided her with an ASO and instructions on use. Weightbearing as tolerated in the ASO for the next 2 weeks. Continue her meloxicam.   Follow-Up Instructions: Return in about 3 weeks (around 05/18/2016).   Orders:  No orders of the defined types were placed in this encounter.  No orders of the defined types were placed in this encounter.     Procedures: No procedures performed   Clinical Data: No additional findings.   Subjective: No chief complaint on file.   The patient is an 82 year old woman who is seen today for evaluation left lateral ankle pain. No known injury. Has noticed pain with ambulation and eversion for the last 2 weeks. This is intermittent. Made worse with weight bearing. States must walk long distances to dining room, has increased pain with long distance walking.     Review of Systems  Constitutional: Negative for chills and fever.     Objective: Vital Signs: There were no vitals taken for this visit.  Physical Exam  Constitutional: She is oriented to person, place, and time. She appears well-developed and well-nourished.  Pulmonary/Chest: Effort normal.  Neurological: She is alert and oriented to person, place, and time.  Psychiatric: She has a normal mood and affect.  Nursing note reviewed.   Left Ankle Exam   Tenderness  The patient is experiencing tenderness in the ATF.   Range of Motion  The patient has normal left ankle ROM.   Tests  Anterior drawer: negative  Comments:  Peroneal and posterior tibial tendons nontender and intact.      Specialty  Comments:  No specialty comments available.  Imaging: No results found.   PMFS History: Patient Active Problem List   Diagnosis Date Noted  . Pain of left scapula 10/20/2015  . Influenza 09/01/2015  . Hip pain 06/02/2015  . Tremor 06/02/2015  . Pain, abdominal, LLQ 02/03/2015  . Vaginal stenosis 11/18/2014  . PAOD (peripheral arterial occlusive disease) ([place]) 11/18/2014  . Osteoporosis, senile 11/18/2014  . Hyperglycemia 11/18/2014  . Left inguinal hernia 04/24/2014  . Diverticulosis of colon with hemorrhage 04/24/2014  . Aortic ejection murmur 04/24/2014  . Dysphagia 01/24/2014  . Esophageal stricture 01/24/2014  . Cervicalgia 08/20/2013  . Hip fracture ([place]) 11/07/2011  . GERD (gastroesophageal reflux disease)   . Hypertension   . High cholesterol   . Hiatal hernia   . Scoliosis   . DDD (degenerative disc disease)   . Depression   . Senile osteoporosis 12/14/2004  . Postmenopausal atrophic vaginitis 12/14/2004  . Edema 11/26/2004  . Urinary incontinence 11/26/2004   Past Medical History:  Diagnosis Date  . Abnormal mammogram, unspecified 04/19/2011  . Allergy   . Anxiety   . Backache, unspecified 01/21/2005  . Cervicalgia 08/20/2013  . Chronic kidney disease, stage II (mild) 04/03/2009  . Chronic rhinitis 09/28/2010  . DDD (degenerative disc disease)   . Depression   . Dermatophytosis of nail 01/21/2005  . Diverticulosis of colon (without  mention of hemorrhage) 06/17/2005  . Edema 11/26/2004  . GERD (gastroesophageal reflux disease)   . Hiatal hernia   . High cholesterol   . Hyperglycemia 11/18/2014  . Hypertension   . Insomnia, unspecified 10/18/2011  . Left inguinal hernia 04/24/2014  . Nocturia 09/19/2008  . Osteoporosis, senile 11/18/2014  . Other abnormal blood chemistry 04/03/2007  . Pain in joint, pelvic region and thigh 11/12/2011  . Pain in joint, shoulder region 08/15/2006  . Pain in limb 08/26/2005  . Palpitations 09/29/2009  . PAOD (peripheral arterial  occlusive disease) ([place]) 11/18/2014   Right leg; diminished popliteal, dorsalis pedis, and posterior tibial artery pulsations   . Postmenopausal atrophic vaginitis 12/14/2004  . Scoliosis   . Senile osteoporosis 12/14/2004  . Tension headache 10/04/2005  . Unspecified cataract 11/26/2004  . Unspecified constipation 01/21/2005  . Unspecified urinary incontinence 11/26/2004  . Vaginal stenosis 11/18/2014   Tight band about 1/2 inches into the vagina which will not allow passage of my index finger.     Family History  Problem Relation Age of Onset  . Heart disease Mother   . Stroke Father   . Heart disease Brother   . Esophageal cancer Neg Hx   . Stomach cancer Neg Hx   . Colon cancer Neg Hx     Past Surgical History:  Procedure Laterality Date  . ABDOMINAL HYSTERECTOMY  1969   Dr.Braun   . bilateral eyelid surgery  2005   Dr.Scott  . BILATERAL OOPHORECTOMY  2002  . BREAST SURGERY     benign tumor removal, left breast 1969  . CATARACT EXTRACTION     Right  . CHOLECYSTECTOMY  1990   Dr.Moore   . COLONOSCOPY  3/18/20008   inflammation, diverticular associated colitis -Dr. Ardis Hughs  . COSMETIC SURGERY    . ESOPHAGOGASTRODUODENOSCOPY N/A 01/24/2014   Procedure: ESOPHAGOGASTRODUODENOSCOPY (EGD);  Surgeon: Irene Shipper, MD;  Location[facility] END[facility]COPY;  Service: Endoscopy;  Laterality: N/A;  . ESOPHAGOGASTRODUODENOSCOPY ENDOSCOPY  04/04/14   Dr. Ardis Hughs  . EYE SURGERY    . FOREIGN BODY REMOVAL N/A 01/24/2014   Procedure: FOREIGN BODY REMOVAL;  Surgeon: Irene Shipper, MD;  Location: [facility];  Service: Endoscopy;  Laterality: N/A;  . HIP PINNING,CANNULATED  11/07/2011   Procedure: CANNULATED HIP PINNING;  Surgeon: Jessy Oto, MD;  Location: [facility];  Service: Orthopedics;  Laterality: Left;  . ORIF FEMORAL NECK FRACTURE W/ [facility]  11/07/2011   Dr.Nitka   . TONSILLECTOMY     Dr.Joe Tamala Julian  . TUBAL LIGATION    . VAGINAL PROLAPSE REPAIR  2002   Dr.Horback    Social History   Occupational History   . retired Producer, television/film/video Retired   Social History Main Topics  . Smoking status: Never Smoker  . Smokeless tobacco: Never Used  . Alcohol use 4.2 oz/week    7 Glasses of wine per week     Comment: one glass of wine in evening  . Drug use: No  . Sexual activity: No

## 2016-05-03 DIAGNOSIS — M25511 Pain in right shoulder: Secondary | ICD-10-CM | POA: Diagnosis not present

## 2016-05-03 DIAGNOSIS — M25521 Pain in right elbow: Secondary | ICD-10-CM | POA: Diagnosis not present

## 2016-05-03 DIAGNOSIS — M25512 Pain in left shoulder: Secondary | ICD-10-CM | POA: Diagnosis not present

## 2016-05-20 ENCOUNTER — Other Ambulatory Visit: Payer: Self-pay

## 2016-05-20 DIAGNOSIS — I10 Essential (primary) hypertension: Secondary | ICD-10-CM

## 2016-05-20 DIAGNOSIS — E7800 Pure hypercholesterolemia, unspecified: Secondary | ICD-10-CM

## 2016-05-20 DIAGNOSIS — R739 Hyperglycemia, unspecified: Secondary | ICD-10-CM

## 2016-05-23 DIAGNOSIS — E7800 Pure hypercholesterolemia, unspecified: Secondary | ICD-10-CM | POA: Diagnosis not present

## 2016-05-23 DIAGNOSIS — I10 Essential (primary) hypertension: Secondary | ICD-10-CM | POA: Diagnosis not present

## 2016-05-23 DIAGNOSIS — R739 Hyperglycemia, unspecified: Secondary | ICD-10-CM | POA: Diagnosis not present

## 2016-05-23 LAB — COMPREHENSIVE METABOLIC PANEL
ALT: 17 U/L (ref 6–29)
AST: 25 U/L (ref 10–35)
Albumin: 4.2 g/dL (ref 3.6–5.1)
Alkaline Phosphatase: 59 U/L (ref 33–130)
BILIRUBIN TOTAL: 0.4 mg/dL (ref 0.2–1.2)
BUN: 16 mg/dL (ref 7–25)
CHLORIDE: 102 mmol/L (ref 98–110)
CO2: 30 mmol/L (ref 20–31)
Calcium: 9.8 mg/dL (ref 8.6–10.4)
Creat: 0.91 mg/dL — ABNORMAL HIGH (ref 0.60–0.88)
Glucose, Bld: 98 mg/dL (ref 65–99)
Potassium: 4.6 mmol/L (ref 3.5–5.3)
Sodium: 139 mmol/L (ref 135–146)
Total Protein: 7.1 g/dL (ref 6.1–8.1)

## 2016-05-23 LAB — LIPID PANEL
Cholesterol: 183 mg/dL (ref <200)
HDL: 67 mg/dL (ref >50)
LDL CALC: 90 mg/dL (ref <100)
Total CHOL/HDL Ratio: 2.7 Ratio (ref <5.0)
Triglycerides: 128 mg/dL (ref <150)
VLDL: 26 mg/dL (ref <30)

## 2016-05-31 ENCOUNTER — Non-Acute Institutional Stay: Payer: Medicare Other | Admitting: Internal Medicine

## 2016-05-31 ENCOUNTER — Encounter: Payer: Self-pay | Admitting: Internal Medicine

## 2016-05-31 VITALS — BP 144/84 | HR 89 | Temp 98.0°F | Ht 62.0 in | Wt 152.0 lb

## 2016-05-31 DIAGNOSIS — S93492D Sprain of other ligament of left ankle, subsequent encounter: Secondary | ICD-10-CM | POA: Diagnosis not present

## 2016-05-31 DIAGNOSIS — R739 Hyperglycemia, unspecified: Secondary | ICD-10-CM | POA: Diagnosis not present

## 2016-05-31 DIAGNOSIS — M25519 Pain in unspecified shoulder: Secondary | ICD-10-CM | POA: Insufficient documentation

## 2016-05-31 DIAGNOSIS — I10 Essential (primary) hypertension: Secondary | ICD-10-CM | POA: Diagnosis not present

## 2016-05-31 DIAGNOSIS — K449 Diaphragmatic hernia without obstruction or gangrene: Secondary | ICD-10-CM

## 2016-05-31 DIAGNOSIS — M25511 Pain in right shoulder: Secondary | ICD-10-CM | POA: Diagnosis not present

## 2016-05-31 DIAGNOSIS — R251 Tremor, unspecified: Secondary | ICD-10-CM

## 2016-05-31 DIAGNOSIS — K219 Gastro-esophageal reflux disease without esophagitis: Secondary | ICD-10-CM | POA: Diagnosis not present

## 2016-05-31 NOTE — Progress Notes (Signed)
Patient ID: Tina Hampton, female   DOB: 1925/09/05, 83 y.o.   MRN: [id number]    Facility  [facility]    Place of Service: Clinic (12)     Allergies  Allergen Reactions  . Penicillins Other (See Comments)    CHILDHOOD REACTION    Chief Complaint  Patient presents with  . Annual Exam    Physical  . Medical Management of Chronic Issues    medication management hyperglycemia, blood pressure, cholesterol, depression, edema, review labs  . MMSE    30/30 passed clock drawing    HPI:   Sprained left anterior talofibular ligament in Dec 2017. Seen at [facility]. It is doing much better. Mild swelling of the left lateral malleolar area.  She has times that she cannot lift the right arm. Little pain.  Medications: Patient's Medications  New Prescriptions   No medications on file  Previous Medications   ANTISEPTIC ORAL RINSE (BIOTENE) LIQD    15 mLs by Mouth Rinse route. Take one tablespoon as needed up to 4 times a day   ASPIRIN 81 MG TABLET    Take 81 mg by mouth daily.   BUPROPION (WELLBUTRIN XL) 150 MG 24 HR TABLET    Take 450 mg by mouth daily.   CALCIUM CARBONATE-VITAMIN D (CALCIUM 600+D3 PO)    Take by mouth. Take 3 daily   CHLORPHENIRAMINE-HYDROCODONE (TUSSIONEX PENNKINETIC ER) 10-8 MG/5ML LQCR    Take one tsp every 12 hours if needed to control  cough   CHOLECALCIFEROL (VITAMIN D) 1000 UNITS TABLET    Take 1,000 Units by mouth daily.   CLONAZEPAM (KLONOPIN) 0.5 MG TABLET    Take 0.5 mg by mouth. Take one half  tablet in morning one tablet in evening as needed for anxiety   GLUCOSAMINE HCL 1000 MG TABS    Take 2,000 mg by mouth 2 (two) times daily.   IBANDRONATE (BONIVA) 150 MG TABLET    Take 1 tablet (150 mg total) by mouth every 30 (thirty) days. Take in the morning with a full glass of water, on an empty stomach, and do not take anything else by mouth or lie down for the next 30 min.   MELOXICAM (MOBIC) 7.5 MG TABLET    One daily to help arthritis   MENTHOL,  TOPICAL ANALGESIC, (BIOFREEZE EX)    Apply topically. Apply topically four times daily   MULTIPLE VITAMIN (MULTIVITAMIN WITH MINERALS) TABS    Take 1 tablet by mouth daily.   NIFEDIPINE (PROCARDIA XL/ADALAT-CC) 60 MG 24 HR TABLET    One daily to control BP   PANTOPRAZOLE (PROTONIX) 40 MG TABLET    TAKE ONE TABLET BY MOUTH ONCE DAILY FOR STOMACH   POLYETHYL GLYCOL-PROPYL GLYCOL (SYSTANE) 0.4-0.3 % SOLN    Place 1 drop into both eyes 2 (two) times daily.    PRAVASTATIN (PRAVACHOL) 20 MG TABLET    TAKE 1 TABLET BY MOUTH EVERY DAY TO LOWER CHOLESTEROL   PROBIOTIC PRODUCT (PROBIOTIC PO)    Take 1 tablet by mouth daily.   VENLAFAXINE XR (EFFEXOR-XR) 150 MG 24 HR CAPSULE    Take 150 mg by mouth every morning.   VENLAFAXINE XR (EFFEXOR-XR) 75 MG 24 HR CAPSULE    TAKE 1 CAPSULE BY MOUTH EVERY MORNING (IN ADDITION TO 150MG)  Modified Medications   No medications on file  Discontinued Medications   ALBUTEROL (PROVENTIL HFA;VENTOLIN HFA) 108 (90 BASE) MCG/ACT INHALER    Inhale 2 puffs into the lungs every 4 (four) hours  as needed for wheezing or shortness of breath (cough, shortness of breath or wheezing.).   BACLOFEN (LIORESAL) 10 MG TABLET    Take 2 tablets po BID.   DICLOFENAC SODIUM (VOLTAREN) 1 % GEL    Apply 2 g topically 4 (four) times daily.     Review of Systems  Constitutional: Negative.  Negative for unexpected weight change.  HENT: Positive for hearing loss (bilateral). Negative for ear pain.   Eyes: Positive for visual disturbance (corrective lenses).  Respiratory: Positive for cough.   Cardiovascular: Positive for leg swelling (both ankles).       Aortic ejection murmur noted 04/20/14.  Gastrointestinal: Positive for abdominal pain (Left lower quadrant).       Heartburn. Constipation. History of hiatal hernia with 1/2 stomach in the chest. Impaction of meat in the esophagus 01/24/14. EGD with dilation of esophageal stricture by Dr. Ardis Hughs on 04/04/14.  Left inguinal hernia on 04/20/14.    Endocrine: Negative for polydipsia, polyphagia and polyuria.       History of mild hyperglycemia in September 2015 and June 2016.  Genitourinary: Negative for hematuria.       Nocturia x5-6. Increased frequency in the daytime. Urge incontinence. Hx urinary tract infection. Vaginal atrophy and stenosis  Musculoskeletal: Positive for neck pain and neck stiffness.       Chronic mild back discomfort. Some joint pains. Pain in the left scapular area inferiorly below the scapular spine. Shoulder is crepitant, but ROM is unimpaired.  Skin: Negative for rash.  Allergic/Immunologic: Negative.   Neurological:       11/18/14 MMSE 30/30. Passed clock drawing.  Hematological: Negative.   Psychiatric/Behavioral: Positive for dysphoric mood and sleep disturbance. The patient is nervous/anxious.        Insomnia..    Vitals:   05/31/16 1035  BP: (!) 144/84  Pulse: 89  Temp: 98 F (36.7 C)  TempSrc: Oral  SpO2: 99%  Weight: 152 lb (68.9 kg)  Height: _0  (1.575 m)   Wt Readings from Last 3 Encounters:  05/31/16 152 lb (68.9 kg)  04/18/16 148 lb (67.1 kg)  12/01/15 149 lb (67.6 kg)    Body mass index is 27.8 kg/m.  Physical Exam  Constitutional: She is oriented to person, place, and time. She appears well-developed and well-nourished. No distress.  HENT:  Head: Normocephalic.  Right Ear: External ear normal.  Left Ear: External ear normal.  Nose: Nose normal.  Mouth/Throat: Oropharynx is clear and moist.   Mild hearing loss bilaterally.  Eyes:  Wears corrective lenses. Left cataract present. Right pupil seems to be nonreactive to light. There are 2 white cystic areas on the left upper eyelid medially. 2 additional areas are present on the left lower lid centrally.  Neck: Neck supple. No JVD present. No tracheal deviation present. No thyromegaly present.  Cardiovascular: Normal rate and regular rhythm.  Exam reveals no gallop and no friction rub.   No murmur heard. 3/6 aortic  ejection murmur with radiation towards left sternal border as well.  Diminished pulsation at right popliteal, right posterior tibial, and right dorsalis pedis.  Pulmonary/Chest: Breath sounds normal. No respiratory distress. She has no wheezes. She has no rales. She exhibits no tenderness.  Cough and bronchial rattle  Abdominal: She exhibits no distension and no mass. There is no tenderness.  Not tender in the left groin or left lower quadrant. Small bulge in the left groin.  Genitourinary: Rectal exam shows guaiac negative stool. No vaginal discharge found.  Genitourinary  Comments: Prior exam:  approximately 1-1/2 inches into the vagina, there is a thin tissue band at is circumferential and narrows the vagina. I'm unable to penetrate through the band with my index finger. There is no tenderness, bleeding, or other excessive tissue growth in this area.  Musculoskeletal: She exhibits edema.  Flat feet with collapsed arches. Unstable gait. Using cane. Pain in the left scapular area under the scapular spine.  Neurological: She is alert and oriented to person, place, and time. She has normal reflexes. No cranial nerve deficit. Coordination normal.  05/31/16 MMSE 30/30. Passed clock drawing.  Skin: No rash noted. No erythema. No pallor.  Multiple seborrheic keratoses.  Psychiatric: She has a normal mood and affect. Her behavior is normal. Judgment and thought content normal.     Labs reviewed: Lab Summary Latest Ref Rng & Units 05/23/2016 11/26/2015 05/21/2015  Hemoglobin 12.2-16.2 g/dL (None) (None) (None)  Hematocrit 37.7-47.9 % (None) (None) (None)  White count 4.6-10.2 K/uL (None) (None) (None)  Platelet count - (None) (None) (None)  Sodium 135 - 146 mmol/L 139 141 140  Potassium 3.5 - 5.3 mmol/L 4.6 5.1 4.8  Calcium 8.6 - 10.4 mg/dL 9.8 (None) (None)  Phosphorus - (None) (None) (None)  Creatinine 0.60 - 0.88 mg/dL 0.91(H) 0.9 0.9  AST 10 - 35 U/L _0 Alk Phos 33 - 130 U/L 59 58 60    Bilirubin 0.2 - 1.2 mg/dL 0.4 (None) (None)  Glucose 65 - 99 mg/dL 98 91 91  Cholesterol <200 mg/dL 183 162 159  HDL cholesterol >50 mg/dL 67 65 59  Triglycerides <150 mg/dL 128 81 89  LDL Direct - (None) (None) (None)  LDL Calc <100 mg/dL 90 81 82  Total protein 6.1 - 8.1 g/dL 7.1 (None) (None)  Albumin 3.6 - 5.1 g/dL 4.2 (None) (None)  Some recent data might be hidden   Lab Results  Component Value Date   TSH 2.90 05/21/2015   Lab Results  Component Value Date   BUN 16 05/23/2016   BUN 18 11/26/2015   BUN 15 05/21/2015   Lab Results  Component Value Date   CREATININE 0.91 (H) 05/23/2016   CREATININE 0.9 11/26/2015   CREATININE 0.9 05/21/2015   No results found for: HGBA1C     Assessment/Plan  1. Sprain of anterior talofibular ligament of left ankle, subsequent encounter improved  2. Gastroesophageal reflux disease without esophagitis asymptomatic on pantoprazole  3. Hiatal hernia asymptomatic  4. Essential hypertension controlled  5. Hyperglycemia Normal on last lab  6. Tremor Mild and intermittent in the right arm  7. Pain in joint of right shoulder Likely shoulder impingement syndrome

## 2016-06-04 ENCOUNTER — Other Ambulatory Visit (INDEPENDENT_AMBULATORY_CARE_PROVIDER_SITE_OTHER): Payer: Self-pay | Admitting: Specialist

## 2016-06-10 NOTE — Telephone Encounter (Signed)
Patient needs ibandronate sodium medication refill

## 2016-06-10 NOTE — Telephone Encounter (Signed)
Patient is requesting ibandronate sodium medication refill Pharmacy: [facility] [address] Cb#: [phone]

## 2016-06-13 ENCOUNTER — Other Ambulatory Visit (INDEPENDENT_AMBULATORY_CARE_PROVIDER_SITE_OTHER): Payer: Self-pay | Admitting: Specialist

## 2016-06-13 MED ORDER — IBANDRONATE SODIUM 150 MG PO TABS: 150.0000 mg | ORAL_TABLET | ORAL | 3 refills | Status: DC

## 2016-06-13 NOTE — Telephone Encounter (Signed)
Rx for boniva 3 mo supply with 3 refills take one per month sent to pharmacy.

## 2016-06-13 NOTE — Telephone Encounter (Signed)
Rx for boniva sent to the pharmacy for #3 tablet, one tablet to be taken each month with 3 refills. jen

## 2016-06-14 NOTE — Progress Notes (Signed)
Called rx in to [facility]

## 2016-06-14 NOTE — Telephone Encounter (Signed)
Called rx into pharmacy.

## 2016-06-23 DIAGNOSIS — F411 Generalized anxiety disorder: Secondary | ICD-10-CM | POA: Diagnosis not present

## 2016-07-04 DIAGNOSIS — F411 Generalized anxiety disorder: Secondary | ICD-10-CM | POA: Diagnosis not present

## 2016-07-12 ENCOUNTER — Other Ambulatory Visit: Payer: Self-pay | Admitting: Internal Medicine

## 2016-07-12 MED ORDER — PANTOPRAZOLE SODIUM 40 MG PO TBEC: DELAYED_RELEASE_TABLET | ORAL | 3 refills | Status: AC

## 2016-07-27 DIAGNOSIS — F411 Generalized anxiety disorder: Secondary | ICD-10-CM | POA: Diagnosis not present

## 2016-08-15 ENCOUNTER — Ambulatory Visit (INDEPENDENT_AMBULATORY_CARE_PROVIDER_SITE_OTHER): Payer: Medicare Other | Admitting: Specialist

## 2016-08-16 DIAGNOSIS — F411 Generalized anxiety disorder: Secondary | ICD-10-CM | POA: Diagnosis not present

## 2016-08-17 ENCOUNTER — Encounter (INDEPENDENT_AMBULATORY_CARE_PROVIDER_SITE_OTHER): Payer: Self-pay | Admitting: Specialist

## 2016-08-17 ENCOUNTER — Ambulatory Visit (INDEPENDENT_AMBULATORY_CARE_PROVIDER_SITE_OTHER): Payer: Medicare Other | Admitting: Specialist

## 2016-08-17 VITALS — BP 144/85 | HR 89 | Ht 63.0 in | Wt 150.0 lb

## 2016-08-17 DIAGNOSIS — G8929 Other chronic pain: Secondary | ICD-10-CM | POA: Diagnosis not present

## 2016-08-17 DIAGNOSIS — M545 Low back pain, unspecified: Secondary | ICD-10-CM

## 2016-08-17 NOTE — Patient Instructions (Signed)
Avoid overhead lifting and overhead use of the arms. Do not lift greater than 5 lbs. Adjust head rest in vehicle to prevent hyperextension if rear ended. Take extra precautions to avoid falling, including use of a cane if you feel weak.   

## 2016-08-17 NOTE — Progress Notes (Signed)
Office Visit Note   Patient: Tina Hampton           Date of Birth: June 16, 1925           MRN: [id number] Visit Date: 08/17/2016              Requested by: Estill Dooms, MD [phone] [place], [place] [address] PCP: Jeanmarie Hubert, MD   Assessment & Plan: Visit Diagnoses:  1. Chronic bilateral low back pain without sciatica     Plan: Avoid overhead lifting and overhead use of the arms. Do not lift greater than 5 lbs. Adjust head rest in vehicle to prevent hyperextension if rear ended. Take extra precautions to avoid falling, including use of a cane if you feel weak.  Follow-Up Instructions: No Follow-up on file.   Orders:  No orders of the defined types were placed in this encounter.  No orders of the defined types were placed in this encounter.     Procedures: No procedures performed   Clinical Data: No additional findings.   Subjective: Chief Complaint  Patient presents with  . Lower Back - Pain    Patient is here today in follow up.  She is complaining of low back pain occasionally.  She has this pain when she does not use her walker.  She is dependant upon the walker.  She also had an instance of right UE weakness.  She is RHD.  She wants to get some strengthening exercises to do.  She has been to PT in the past for her neck.  She takes Mobic daily. Concern about the right arm with weakness,  Previous notes indicate medial epicondylitis. Does exercise regularly. Has been on an antidepressant in the past.    Review of Systems  Constitutional: Negative.   HENT: Negative.   Eyes: Negative.   Respiratory: Negative.   Cardiovascular: Negative.   Gastrointestinal: Negative.   Endocrine: Negative.   Genitourinary: Negative.   Musculoskeletal: Negative.   Skin: Negative.   Allergic/Immunologic: Negative.   Neurological: Negative.   Hematological: Negative.   Psychiatric/Behavioral: Negative.      Objective: Vital Signs: BP (!) 144/85   Pulse  89   Ht 5\' 3"  (1.6 m)   Wt 150 lb (68 kg)   BMI 26.57 kg/m   Physical Exam  Constitutional: She is oriented to person, place, and time. She appears well-developed and well-nourished.  HENT:  Head: Normocephalic and atraumatic.  Eyes: EOM are normal. Pupils are equal, round, and reactive to light.  Neck: Normal range of motion. Neck supple.  Pulmonary/Chest: Effort normal and breath sounds normal.  Abdominal: Soft. Bowel sounds are normal.  Neurological: She is alert and oriented to person, place, and time.  Skin: Skin is warm and dry.  Psychiatric: She has a normal mood and affect. Her behavior is normal. Judgment and thought content normal.    Back Exam   Tenderness  The patient is experiencing tenderness in the lumbar.  Range of Motion  Extension: abnormal  Flexion: normal  Lateral Bend Right: abnormal  Lateral Bend Left: abnormal  Rotation Right: abnormal  Rotation Left: abnormal   Muscle Strength  Right Quadriceps:  5/5  Left Quadriceps:  5/5  Right Hamstrings:  5/5  Left Hamstrings:  5/5   Tests  Straight leg raise right: negative Straight leg raise left: negative  Reflexes  Patellar: normal Achilles: normal Biceps: normal  Other  Toe Walk: normal Heel Walk: normal Sensation: normal Gait: normal  Erythema:  no back redness Scars: absent      Specialty Comments:  No specialty comments available.  Imaging: No results found.   PMFS History: Patient Active Problem List   Diagnosis Date Noted  . Chronic bilateral low back pain without sciatica 08/17/2016  . Pain in joint, shoulder region 05/31/2016  . Sprain of anterior talofibular ligament 04/27/2016  . Pain of left scapula 10/20/2015  . Hip pain 06/02/2015  . Tremor 06/02/2015  . Pain, abdominal, LLQ 02/03/2015  . Vaginal stenosis 11/18/2014  . PAOD (peripheral arterial occlusive disease) ([place]) 11/18/2014  . Osteoporosis, senile 11/18/2014  . Hyperglycemia 11/18/2014  . Left inguinal  hernia 04/24/2014  . Diverticulosis of colon with hemorrhage 04/24/2014  . Aortic ejection murmur 04/24/2014  . Dysphagia 01/24/2014  . Esophageal stricture 01/24/2014  . Cervicalgia 08/20/2013  . Hip fracture ([place]) 11/07/2011  . GERD (gastroesophageal reflux disease)   . Hypertension   . High cholesterol   . Hiatal hernia   . Scoliosis   . DDD (degenerative disc disease)   . Depression   . Senile osteoporosis 12/14/2004  . Postmenopausal atrophic vaginitis 12/14/2004  . Edema 11/26/2004  . Urinary incontinence 11/26/2004   Past Medical History:  Diagnosis Date  . Abnormal mammogram, unspecified 04/19/2011  . Allergy   . Anxiety   . Backache, unspecified 01/21/2005  . Cervicalgia 08/20/2013  . Chronic kidney disease, stage II (mild) 04/03/2009  . Chronic rhinitis 09/28/2010  . DDD (degenerative disc disease)   . Depression   . Dermatophytosis of nail 01/21/2005  . Diverticulosis of colon (without mention of hemorrhage) 06/17/2005  . Edema 11/26/2004  . GERD (gastroesophageal reflux disease)   . Hiatal hernia   . High cholesterol   . Hyperglycemia 11/18/2014  . Hypertension   . Insomnia, unspecified 10/18/2011  . Left inguinal hernia 04/24/2014  . Nocturia 09/19/2008  . Osteoporosis, senile 11/18/2014  . Other abnormal blood chemistry 04/03/2007  . Pain in joint, pelvic region and thigh 11/12/2011  . Pain in joint, shoulder region 08/15/2006  . Pain in limb 08/26/2005  . Palpitations 09/29/2009  . PAOD (peripheral arterial occlusive disease) ([facility]) 11/18/2014   Right leg; diminished popliteal, dorsalis pedis, and posterior tibial artery pulsations   . Postmenopausal atrophic vaginitis 12/14/2004  . Scoliosis   . Senile osteoporosis 12/14/2004  . Tension headache 10/04/2005  . Unspecified cataract 11/26/2004  . Unspecified constipation 01/21/2005  . Unspecified urinary incontinence 11/26/2004  . Vaginal stenosis 11/18/2014   Tight band about 1/2 inches into the vagina which will not allow passage of  my index finger.     Family History  Problem Relation Age of Onset  . Heart disease Mother   . Stroke Father   . Heart disease Brother   . Esophageal cancer Neg Hx   . Stomach cancer Neg Hx   . Colon cancer Neg Hx     Past Surgical History:  Procedure Laterality Date  . ABDOMINAL HYSTERECTOMY  1969   Dr.Braun   . bilateral eyelid surgery  2005   Dr.Scott  . BILATERAL OOPHORECTOMY  2002  . BREAST SURGERY     benign tumor removal, left breast 1969  . CATARACT EXTRACTION     Right  . CHOLECYSTECTOMY  1990   Dr.Moore   . COLONOSCOPY  3/18/20008   inflammation, diverticular associated colitis -Dr. Ardis Hughs  . COSMETIC SURGERY    . ESOPHAGOGASTRODUODENOSCOPY N/A 01/24/2014   Procedure: ESOPHAGOGASTRODUODENOSCOPY (EGD);  Surgeon: Irene Shipper, MD;  Location: [facility];  Service: Endoscopy;  Laterality: N/A;  . ESOPHAGOGASTRODUODENOSCOPY ENDOSCOPY  04/04/14   Dr. Ardis Hughs  . EYE SURGERY    . FOREIGN BODY REMOVAL N/A 01/24/2014   Procedure: FOREIGN BODY REMOVAL;  Surgeon: Irene Shipper, MD;  Location: [facility];  Service: Endoscopy;  Laterality: N/A;  . HIP PINNING,CANNULATED  11/07/2011   Procedure: CANNULATED HIP PINNING;  Surgeon: Jessy Oto, MD;  Location: [facility];  Service: Orthopedics;  Laterality: Left;  . ORIF FEMORAL NECK FRACTURE W/ [facility]  11/07/2011   Dr.Nitka   . TONSILLECTOMY     Dr.Joe Tamala Julian  . TUBAL LIGATION    . VAGINAL PROLAPSE REPAIR  2002   Dr.Horback    Social History   Occupational History  . retired Producer, television/film/video Retired   Social History Main Topics  . Smoking status: Never Smoker  . Smokeless tobacco: Never Used  . Alcohol use 4.2 oz/week    7 Glasses of wine per week     Comment: one glass of wine in evening  . Drug use: No  . Sexual activity: No

## 2016-08-20 ENCOUNTER — Other Ambulatory Visit: Payer: Self-pay | Admitting: Internal Medicine

## 2016-08-20 DIAGNOSIS — M150 Primary generalized (osteo)arthritis: Principal | ICD-10-CM

## 2016-08-20 DIAGNOSIS — M159 Polyosteoarthritis, unspecified: Secondary | ICD-10-CM

## 2016-08-25 DIAGNOSIS — F333 Major depressive disorder, recurrent, severe with psychotic symptoms: Secondary | ICD-10-CM | POA: Diagnosis not present

## 2016-08-30 DIAGNOSIS — F411 Generalized anxiety disorder: Secondary | ICD-10-CM | POA: Diagnosis not present

## 2016-09-06 DIAGNOSIS — F411 Generalized anxiety disorder: Secondary | ICD-10-CM | POA: Diagnosis not present

## 2016-09-12 DIAGNOSIS — F411 Generalized anxiety disorder: Secondary | ICD-10-CM | POA: Diagnosis not present

## 2016-09-22 DIAGNOSIS — F411 Generalized anxiety disorder: Secondary | ICD-10-CM | POA: Diagnosis not present

## 2016-09-28 DIAGNOSIS — F333 Major depressive disorder, recurrent, severe with psychotic symptoms: Secondary | ICD-10-CM | POA: Diagnosis not present

## 2016-09-29 DIAGNOSIS — F411 Generalized anxiety disorder: Secondary | ICD-10-CM | POA: Diagnosis not present

## 2016-10-04 DIAGNOSIS — F411 Generalized anxiety disorder: Secondary | ICD-10-CM | POA: Diagnosis not present

## 2016-10-11 DIAGNOSIS — F333 Major depressive disorder, recurrent, severe with psychotic symptoms: Secondary | ICD-10-CM | POA: Diagnosis not present

## 2016-10-11 DIAGNOSIS — F411 Generalized anxiety disorder: Secondary | ICD-10-CM | POA: Diagnosis not present

## 2016-10-13 ENCOUNTER — Encounter: Payer: Self-pay | Admitting: Nurse Practitioner

## 2016-10-13 ENCOUNTER — Ambulatory Visit (INDEPENDENT_AMBULATORY_CARE_PROVIDER_SITE_OTHER): Payer: Medicare Other | Admitting: Nurse Practitioner

## 2016-10-13 VITALS — BP 128/82 | HR 96 | Temp 97.4°F | Resp 18 | Ht 63.0 in | Wt 153.0 lb

## 2016-10-13 DIAGNOSIS — K529 Noninfective gastroenteritis and colitis, unspecified: Secondary | ICD-10-CM | POA: Diagnosis not present

## 2016-10-13 DIAGNOSIS — R21 Rash and other nonspecific skin eruption: Secondary | ICD-10-CM

## 2016-10-13 DIAGNOSIS — F3340 Major depressive disorder, recurrent, in remission, unspecified: Secondary | ICD-10-CM | POA: Diagnosis not present

## 2016-10-13 NOTE — Patient Instructions (Addendum)
STOP Abilify If Depression worsen to call Dr Maximiano Coss office    [place], Rice, Applesauce, Toast Advance as tolerate Drink Gatorade to help with electrolytes and fluids Hold miralax for the next few days   Food Choices to Help Relieve Diarrhea, Adult When you have diarrhea, the foods you eat and your eating habits are very important. Choosing the right foods and drinks can help:  Relieve diarrhea.  Replace lost fluids and nutrients.  Prevent dehydration.  What general guidelines should I follow? Relieving diarrhea  Choose foods with less than 2 g or .07 oz. of fiber per serving.  Limit fats to less than 8 tsp (38 g or 1.34 oz.) a day.  Avoid the following: ? Foods and beverages sweetened with high-fructose corn syrup, honey, or sugar alcohols such as xylitol, sorbitol, and mannitol. ? Foods that contain a lot of fat or sugar. ? Fried, greasy, or spicy foods. ? High-fiber grains, breads, and cereals. ? Raw fruits and vegetables.  Eat foods that are rich in probiotics. These foods include dairy products such as yogurt and fermented milk products. They help increase healthy bacteria in the stomach and intestines (gastrointestinal tract, or GI tract).  If you have lactose intolerance, avoid dairy products. These may make your diarrhea worse.  Take medicine to help stop diarrhea (antidiarrheal medicine) only as told by your health care provider. Replacing nutrients  Eat small meals or snacks every 3-4 hours.  Eat bland foods, such as white rice, toast, or baked potato, until your diarrhea starts to get better. Gradually reintroduce nutrient-rich foods as tolerated or as told by your health care provider. This includes: ? Well-cooked protein foods. ? Peeled, seeded, and soft-cooked fruits and vegetables. ? Low-fat dairy products.  Take vitamin and mineral supplements as told by your health care provider. Preventing dehydration   Start by sipping water or a  special solution to prevent dehydration (oral rehydration solution, ORS). Urine that is clear or pale yellow means that you are getting enough fluid.  Try to drink at least 8-10 cups of fluid each day to help replace lost fluids.  You may add other liquids in addition to water, such as clear juice or decaffeinated sports drinks, as tolerated or as told by your health care provider.  Avoid drinks with caffeine, such as coffee, tea, or soft drinks.  Avoid alcohol. What foods are recommended? The items listed may not be a complete list. Talk with your health care provider about what dietary choices are best for you. Grains White rice. White, [place], or pita breads (fresh or toasted), including plain rolls, buns, or bagels. White pasta. Saltine, soda, or graham crackers. Pretzels. Low-fiber cereal. Cooked cereals made with water (such as cornmeal, farina, or cream cereals). Plain muffins. Matzo. Melba toast. Zwieback. Vegetables Potatoes (without the skin). Most well-cooked and canned vegetables without skins or seeds. Tender lettuce. Fruits Apple sauce. Fruits canned in juice. Cooked apricots, cherries, grapefruit, peaches, pears, or plums. Fresh bananas and cantaloupe. Meats and other protein foods Baked or boiled chicken. Eggs. Tofu. Fish. Seafood. Smooth nut butters. Ground or well-cooked tender beef, ham, veal, lamb, pork, or poultry. Dairy Plain yogurt, kefir, and unsweetened liquid yogurt. Lactose-free milk, buttermilk, skim milk, or soy milk. Low-fat or nonfat hard cheese. Beverages Water. Low-calorie sports drinks. Fruit juices without pulp. Strained tomato and vegetable juices. Decaffeinated teas. Sugar-free beverages not sweetened with sugar alcohols. Oral rehydration solutions, if approved by your health care provider. Seasoning and other foods Bouillon, broth,  or soups made from recommended foods. What foods are not recommended? The items listed may not be a complete list. Talk  with your health care provider about what dietary choices are best for you. Grains Whole grain, whole wheat, bran, or rye breads, rolls, pastas, and crackers. Wild or brown rice. Whole grain or bran cereals. Barley. Oats and oatmeal. Corn tortillas or taco shells. Granola. Popcorn. Vegetables Raw vegetables. Fried vegetables. Cabbage, broccoli, Brussels sprouts, artichokes, baked beans, beet greens, corn, kale, legumes, peas, sweet potatoes, and yams. Potato skins. Cooked spinach and cabbage. Fruits Dried fruit, including raisins and dates. Raw fruits. Stewed or dried prunes. Canned fruits with syrup. Meat and other protein foods Fried or fatty meats. Deli meats. Chunky nut butters. Nuts and seeds. Beans and lentils. Berniece Salines. Hot dogs. Sausage. Dairy High-fat cheeses. Whole milk, chocolate milk, and beverages made with milk, such as milk shakes. Half-and-half. Cream. sour cream. Ice cream. Beverages Caffeinated beverages (such as coffee, tea, soda, or energy drinks). Alcoholic beverages. Fruit juices with pulp. Prune juice. Soft drinks sweetened with high-fructose corn syrup or sugar alcohols. High-calorie sports drinks. Fats and oils Butter. Cream sauces. Margarine. Salad oils. Plain salad dressings. Olives. Avocados. Mayonnaise. Sweets and desserts Sweet rolls, doughnuts, and sweet breads. Sugar-free desserts sweetened with sugar alcohols such as xylitol and sorbitol. Seasoning and other foods Honey. Hot sauce. Chili powder. Gravy. Cream-based or milk-based soups. Pancakes and waffles. Summary  When you have diarrhea, the foods you eat and your eating habits are very important.  Make sure you get at least 8-10 cups of fluid each day, or enough to keep your urine clear or pale yellow.  Eat bland foods and gradually reintroduce healthy, nutrient-rich foods as tolerated, or as told by your health care provider.  Avoid high-fiber, fried, greasy, or spicy foods. This information is not  intended to replace advice given to you by your health care provider. Make sure you discuss any questions you have with your health care provider. Document Released: 07/23/2003 Document Revised: 04/29/2016 Document Reviewed: 04/29/2016 Elsevier Interactive Patient Education  2017 [employer].

## 2016-10-13 NOTE — Progress Notes (Addendum)
Careteam: Patient Care Team: Estill Dooms, MD as PCP - General (Internal Medicine) Melina Modena, [facility] Clent Jacks, MD as Consulting Physician (Ophthalmology) Milus Banister, MD as Consulting Physician (Gastroenterology) Norma Fredrickson, MD as Consulting Physician (Psychiatry) Jessy Oto, MD as Consulting Physician (Orthopedic Surgery) Bjorn Loser, MD as Consulting Physician (Urology)  Advanced Directive information Does Patient Have a Medical Advance Directive?: Yes, Type of Advance Directive: [place];Living will;Out of facility DNR (pink MOST or yellow form), Pre-existing out of facility DNR order (yellow form or pink MOST form): Pink MOST form placed in chart (order not valid for inpatient use)  Allergies  Allergen Reactions  . Penicillins Other (See Comments)    CHILDHOOD REACTION    Chief Complaint  Patient presents with  . Acute Visit    Pt is being seen due to stomach upset with N&V 4 days ago, loose stools today. Pt states she is having sore muscles in her stomach after N&V,     HPI: Patient is a 83 y.o. female seen in the office today due to nausea and vomiting 4 days ago. 5 days ago she started feeling tired after trying on some new clothes and feeling uneasy on her stomach. Went to bed started to dry heave and had ongoing vomiting all night. The following day was very sore due to dry heaving and vomiting. Unsure if she had vomiting last night or the night before. Small amount. Not as sore today.  This morning had diarrhea after taking miralax.  Reports always someone sick at friends home.  Feeling weak and light headed because she has not eaten much.  Ate cereal with whole milk and apple juice and coffee this morning and tolerated it well. No nausea at this time.  States memory is off today.   Review of Systems:  Review of Systems  Constitutional: Negative for chills, fever and malaise/fatigue.  Respiratory: Negative for cough.    Cardiovascular: Negative for chest pain, palpitations and leg swelling.  Gastrointestinal: Positive for diarrhea. Negative for abdominal pain, constipation, heartburn, nausea and vomiting.       Abdomen is sore  Genitourinary: Negative for dysuria, frequency and urgency.  Skin: Negative.   Neurological: Positive for weakness. Negative for dizziness and headaches.  Psychiatric/Behavioral: Positive for memory loss.    Past Medical History:  Diagnosis Date  . Abnormal mammogram, unspecified 04/19/2011  . Allergy   . Anxiety   . Backache, unspecified 01/21/2005  . Cervicalgia 08/20/2013  . Chronic kidney disease, stage II (mild) 04/03/2009  . Chronic rhinitis 09/28/2010  . DDD (degenerative disc disease)   . Depression   . Dermatophytosis of nail 01/21/2005  . Diverticulosis of colon (without mention of hemorrhage) 06/17/2005  . Edema 11/26/2004  . GERD (gastroesophageal reflux disease)   . Hiatal hernia   . High cholesterol   . Hyperglycemia 11/18/2014  . Hypertension   . Insomnia, unspecified 10/18/2011  . Left inguinal hernia 04/24/2014  . Nocturia 09/19/2008  . Osteoporosis, senile 11/18/2014  . Other abnormal blood chemistry 04/03/2007  . Pain in joint, pelvic region and thigh 11/12/2011  . Pain in joint, shoulder region 08/15/2006  . Pain in limb 08/26/2005  . Palpitations 09/29/2009  . PAOD (peripheral arterial occlusive disease) ([place]) 11/18/2014   Right leg; diminished popliteal, dorsalis pedis, and posterior tibial artery pulsations   . Postmenopausal atrophic vaginitis 12/14/2004  . Scoliosis   . Senile osteoporosis 12/14/2004  . Tension headache 10/04/2005  . Unspecified cataract  11/26/2004  . Unspecified constipation 01/21/2005  . Unspecified urinary incontinence 11/26/2004  . Vaginal stenosis 11/18/2014   Tight band about 1/2 inches into the vagina which will not allow passage of my index finger.    Past Surgical History:  Procedure Laterality Date  . ABDOMINAL HYSTERECTOMY  1969    Dr.Braun   . bilateral eyelid surgery  2005   Dr.Scott  . BILATERAL OOPHORECTOMY  2002  . BREAST SURGERY     benign tumor removal, left breast 1969  . CATARACT EXTRACTION     Right  . CHOLECYSTECTOMY  1990   Dr.Moore   . COLONOSCOPY  3/18/20008   inflammation, diverticular associated colitis -Dr. Ardis Hughs  . COSMETIC SURGERY    . ESOPHAGOGASTRODUODENOSCOPY N/A 01/24/2014   Procedure: ESOPHAGOGASTRODUODENOSCOPY (EGD);  Surgeon: Irene Shipper, MD;  Location: [facility] ENDOSCOPY;  Service: Endoscopy;  Laterality: N/A;  . ESOPHAGOGASTRODUODENOSCOPY ENDOSCOPY  04/04/14   Dr. Ardis Hughs  . EYE SURGERY    . FOREIGN BODY REMOVAL N/A 01/24/2014   Procedure: FOREIGN BODY REMOVAL;  Surgeon: Irene Shipper, MD;  Location: [place];  Service: Endoscopy;  Laterality: N/A;  . HIP PINNING,CANNULATED  11/07/2011   Procedure: CANNULATED HIP PINNING;  Surgeon: Jessy Oto, MD;  Location: [facility];  Service: Orthopedics;  Laterality: Left;  . ORIF FEMORAL NECK FRACTURE W/ [facility]  11/07/2011   Dr.Nitka   . TONSILLECTOMY     Dr.Joe Tamala Julian  . TUBAL LIGATION    . VAGINAL PROLAPSE REPAIR  2002   Dr.Horback    Social History:   reports that she has never smoked. She has never used smokeless tobacco. She reports that she drinks about 4.2 oz of alcohol per week . She reports that she does not use drugs.  Family History  Problem Relation Age of Onset  . Heart disease Mother   . Stroke Father   . Heart disease Brother   . Esophageal cancer Neg Hx   . Stomach cancer Neg Hx   . Colon cancer Neg Hx     Medications: Patient's Medications  New Prescriptions   No medications on file  Previous Medications   ANTISEPTIC ORAL RINSE (BIOTENE) LIQD    15 mLs by Mouth Rinse route. Take one tablespoon as needed up to 4 times a day   ARIPIPRAZOLE (ABILIFY) 2 MG TABLET    Take 2 mg by mouth every morning.   ASPIRIN 81 MG TABLET    Take 81 mg by mouth daily.   BUPROPION (WELLBUTRIN XL) 150 MG 24 HR TABLET    Take 450 mg by mouth  daily.   CALCIUM CARBONATE-VITAMIN D (CALCIUM 600+D3 PO)    Take by mouth. Take 3 daily   CHLORPHENIRAMINE-HYDROCODONE (TUSSIONEX PENNKINETIC ER) 10-8 MG/5ML LQCR    Take one tsp every 12 hours if needed to control  cough   CHOLECALCIFEROL (VITAMIN D) 1000 UNITS TABLET    Take 1,000 Units by mouth daily.   CLONAZEPAM (KLONOPIN) 0.5 MG TABLET    Take 0.5 mg by mouth. Take one half  tablet in morning one tablet in evening as needed for anxiety   GLUCOSAMINE HCL 1000 MG TABS    Take 2,000 mg by mouth 2 (two) times daily.   IBANDRONATE (BONIVA) 150 MG TABLET    Take 1 tablet (150 mg total) by mouth every 30 (thirty) days. Take in the morning with a full glass of water, on an empty stomach, and do not take anything else by mouth or lie down  for the next 30 min.   MELOXICAM (MOBIC) 7.5 MG TABLET    TAKE 1 TABLET BY MOUTH EVERY DAY AS NEEDED FOR ARTHRITIS   MENTHOL, TOPICAL ANALGESIC, (BIOFREEZE EX)    Apply topically. Apply topically four times daily   MULTIPLE VITAMIN (MULTIVITAMIN WITH MINERALS) TABS    Take 1 tablet by mouth daily.   NIFEDIPINE (PROCARDIA XL/ADALAT-CC) 60 MG 24 HR TABLET    One daily to control BP   PANTOPRAZOLE (PROTONIX) 40 MG TABLET    One daily to reduce stomach acid   POLYETHYL GLYCOL-PROPYL GLYCOL (SYSTANE) 0.4-0.3 % SOLN    Place 1 drop into both eyes 2 (two) times daily.    PRAVASTATIN (PRAVACHOL) 20 MG TABLET    TAKE 1 TABLET BY MOUTH EVERY DAY TO LOWER CHOLESTEROL   PROBIOTIC PRODUCT (PROBIOTIC PO)    Take 1 tablet by mouth daily.   VENLAFAXINE (EFFEXOR) 75 MG TABLET    Take 75 mg by mouth daily.  Modified Medications   No medications on file  Discontinued Medications   VENLAFAXINE XR (EFFEXOR-XR) 150 MG 24 HR CAPSULE    Take 150 mg by mouth every morning.   VENLAFAXINE XR (EFFEXOR-XR) 75 MG 24 HR CAPSULE    TAKE 1 CAPSULE BY MOUTH EVERY MORNING (IN ADDITION TO 150MG )     Physical Exam:  Vitals:   10/13/16 1136  BP: 128/82  Pulse: 96  Resp: 18  Temp: 97.4 F  (36.3 C)  TempSrc: Oral  SpO2: 96%  Weight: 153 lb (69.4 kg)  Height: 5\' 3"  (1.6 m)   Body mass index is 27.1 kg/m.  Physical Exam  Constitutional: She is oriented to person, place, and time. She appears well-developed and well-nourished. No distress.  HENT:  Head: Normocephalic and atraumatic.  Mouth/Throat: Oropharynx is clear and moist. No oropharyngeal exudate.  Eyes: Conjunctivae are normal. Pupils are equal, round, and reactive to light.  Neck: Normal range of motion. Neck supple.  Cardiovascular: Normal rate, regular rhythm and normal heart sounds.   Pulmonary/Chest: Effort normal and breath sounds normal.  Abdominal: Soft. Bowel sounds are normal.  Musculoskeletal: She exhibits no edema or tenderness.  Neurological: She is alert and oriented to person, place, and time.  Skin: Skin is warm and dry. Rash noted. She is not diaphoretic. There is erythema.  Blanchable redness noted to truck, abdomen and buttocks sparing the folds of breast and abdomen  No pain or itching Slight warmth.   Psychiatric: She has a normal mood and affect.    Labs reviewed: Basic Metabolic Panel:  Recent Labs  11/26/15 05/23/16 0800  NA 141 139  K 5.1 4.6  CL  --  102  CO2  --  30  GLUCOSE  --  98  BUN 18 16  CREATININE 0.9 0.91*  CALCIUM  --  9.8   Liver Function Tests:  Recent Labs  11/26/15 05/23/16 0800  AST 21 25  ALT 16 17  ALKPHOS 58 59  BILITOT  --  0.4  PROT  --  7.1  ALBUMIN  --  4.2   No results for input(s): LIPASE, AMYLASE in the last 8760 hours. No results for input(s): AMMONIA in the last 8760 hours. CBC: No results for input(s): WBC, NEUTROABS, HGB, HCT, MCV, PLT in the last 8760 hours. Lipid Panel:  Recent Labs  11/26/15 05/23/16 0800  CHOL 162 183  HDL 65 67  LDLCALC 81 90  TRIG 81 128  CHOLHDL  --  2.7  TSH: No results for input(s): TSH in the last 8760 hours. A1C: No results found for: HGBA1C   Assessment/Plan 1.  Gastroenteritis Improved, to hold miralax for now.  Bland diet advance as tolerated -increase hydration, to drink Gatorade or other electrolyte drink to help with hydration and electrolytes   2. Rash Blanchable redness to truck which appears sunburn like in appearance. ?post viral vs drug reaction. Pt was unaware of the redness/rash until she was exam. Unsure when this started and reports it does not bother her. Will stop Abilify at this time.  Educated if rash worsens or other symptoms occur to seek medication attention immediatly.   3. Recurrent major depressive disorder, in remission ([place]) Pt was started on Abilify on 09/28/16 due to worsening depression. Followed up with Dr Casimiro Needle 10/11/16 and was doing much better. Spoke with Dr Casimiro Needle due to new rash, to stop Abilify at this time. Since her depression has significantly improved to follow up with Dr Casimiro Needle if depression worsens off medication.   Carlos American. Harle Battiest  [facility] [phone] 8 am - 5 pm) [phone] (after hours)

## 2016-10-14 ENCOUNTER — Ambulatory Visit (INDEPENDENT_AMBULATORY_CARE_PROVIDER_SITE_OTHER): Payer: Medicare Other | Admitting: Internal Medicine

## 2016-10-14 ENCOUNTER — Encounter: Payer: Self-pay | Admitting: Internal Medicine

## 2016-10-14 ENCOUNTER — Telehealth: Payer: Self-pay | Admitting: *Deleted

## 2016-10-14 VITALS — BP 122/68 | HR 103 | Temp 97.8°F | Ht 63.0 in | Wt 153.6 lb

## 2016-10-14 DIAGNOSIS — T7840XD Allergy, unspecified, subsequent encounter: Secondary | ICD-10-CM | POA: Diagnosis not present

## 2016-10-14 DIAGNOSIS — L270 Generalized skin eruption due to drugs and medicaments taken internally: Secondary | ICD-10-CM | POA: Diagnosis not present

## 2016-10-14 DIAGNOSIS — K219 Gastro-esophageal reflux disease without esophagitis: Secondary | ICD-10-CM

## 2016-10-14 MED ORDER — METHYLPREDNISOLONE ACETATE 40 MG/ML IJ SUSP
40.0000 mg | Freq: Once | INTRAMUSCULAR | Status: AC
  Administered 2016-10-14: 40 mg via INTRAMUSCULAR

## 2016-10-14 MED ORDER — ONDANSETRON HCL 4 MG PO TABS: 4.0000 mg | ORAL_TABLET | Freq: Three times a day (TID) | ORAL | 0 refills | Status: DC | PRN

## 2016-10-14 MED ORDER — PREDNISONE 10 MG PO TABS: ORAL_TABLET | ORAL | 0 refills | Status: DC

## 2016-10-14 NOTE — Progress Notes (Signed)
Patient ID: Tina Hampton, female   DOB: 1925/07/06, 83 y.o.   MRN: [id number]    Location:  [facility] Place of Service: OFFICE  Chief Complaint  Patient presents with  . Rash    Patient states rash has worsened. Is very itchy.    HPI:  83 yo female seen today for worsening rash that was noted on her exam yesterday. Rash is now itchy. No interruption sleep. No CP/SOB. No hives. No f/c. No facial, lip or tongue swelling. She stopped her abilify yesterday. HR 103 today; O2 sat 96%    Past Medical History:  Diagnosis Date  . Abnormal mammogram, unspecified 04/19/2011  . Allergy   . Anxiety   . Backache, unspecified 01/21/2005  . Cervicalgia 08/20/2013  . Chronic kidney disease, stage II (mild) 04/03/2009  . Chronic rhinitis 09/28/2010  . DDD (degenerative disc disease)   . Depression   . Dermatophytosis of nail 01/21/2005  . Diverticulosis of colon (without mention of hemorrhage) 06/17/2005  . Edema 11/26/2004  . GERD (gastroesophageal reflux disease)   . Hiatal hernia   . High cholesterol   . Hyperglycemia 11/18/2014  . Hypertension   . Insomnia, unspecified 10/18/2011  . Left inguinal hernia 04/24/2014  . Nocturia 09/19/2008  . Osteoporosis, senile 11/18/2014  . Other abnormal blood chemistry 04/03/2007  . Pain in joint, pelvic region and thigh 11/12/2011  . Pain in joint, shoulder region 08/15/2006  . Pain in limb 08/26/2005  . Palpitations 09/29/2009  . PAOD (peripheral arterial occlusive disease) ([place]) 11/18/2014   Right leg; diminished popliteal, dorsalis pedis, and posterior tibial artery pulsations   . Postmenopausal atrophic vaginitis 12/14/2004  . Scoliosis   . Senile osteoporosis 12/14/2004  . Tension headache 10/04/2005  . Unspecified cataract 11/26/2004  . Unspecified constipation 01/21/2005  . Unspecified urinary incontinence 11/26/2004  . Vaginal stenosis 11/18/2014   Tight band about 1/2 inches into the vagina which will not allow passage of my index finger.     Past Surgical History:    Procedure Laterality Date  . ABDOMINAL HYSTERECTOMY  1969   Dr.Braun   . bilateral eyelid surgery  2005   Dr.Scott  . BILATERAL OOPHORECTOMY  2002  . BREAST SURGERY     benign tumor removal, left breast 1969  . CATARACT EXTRACTION     Right  . CHOLECYSTECTOMY  1990   Dr.Moore   . COLONOSCOPY  3/18/20008   inflammation, diverticular associated colitis -Dr. Ardis Hughs  . COSMETIC SURGERY    . ESOPHAGOGASTRODUODENOSCOPY N/A 01/24/2014   Procedure: ESOPHAGOGASTRODUODENOSCOPY (EGD);  Surgeon: Irene Shipper, MD;  [facility] ENDOSCOPY;  Service: Endoscopy;  Laterality: N/A;  . ESOPHAGOGASTRODUODENOSCOPY ENDOSCOPY  04/04/14   Dr. Ardis Hughs  . EYE SURGERY    . FOREIGN BODY REMOVAL N/A 01/24/2014   Procedure: FOREIGN BODY REMOVAL;  Surgeon: Irene Shipper, MD;  Location: [place];  Service: Endoscopy;  Laterality: N/A;  . HIP PINNING,CANNULATED  11/07/2011   Procedure: CANNULATED HIP PINNING;  Surgeon: Jessy Oto, MD;  Location: [facility];  Service: Orthopedics;  Laterality: Left;  . ORIF FEMORAL NECK FRACTURE W/ [facility]  11/07/2011   Dr.Nitka   . TONSILLECTOMY     Dr.Joe Tamala Julian  . TUBAL LIGATION    . VAGINAL PROLAPSE REPAIR  2002   Dr.Horback     Patient Care Team: Estill Dooms, MD as PCP - General (Internal Medicine) [place], [facility] Clent Jacks, MD as Consulting Physician (Ophthalmology) Milus Banister, MD as Consulting Physician (Gastroenterology) [place],  Berneta Sages, MD as Consulting Physician (Psychiatry) Jessy Oto, MD as Consulting Physician (Orthopedic Surgery) Bjorn Loser, MD as Consulting Physician (Urology)  Social History   Social History  . Marital status: Widowed    Spouse name: N/A  . Number of children: N/A  . Years of education: N/A   Occupational History  . retired Producer, television/film/video Retired   Social History Main Topics  . Smoking status: Never Smoker  . Smokeless tobacco: Never Used  . Alcohol use 4.2 oz/week    7 Glasses of wine per week      Comment: one glass of wine in evening  . Drug use: No  . Sexual activity: No   Other Topics Concern  . Not on file   Social History Narrative   Lives at [facility] since 04/14/2005   Widowed (husband expired 2014)   Has Living Will, POA, MOST   Exercise: water aerobic  5 times a week   Walks with walker   Never smoked   Drinks moderate amount of caffeinate beverages daily   Alcohol none           reports that she has never smoked. She has never used smokeless tobacco. She reports that she drinks about 4.2 oz of alcohol per week . She reports that she does not use drugs.  Family History  Problem Relation Age of Onset  . Heart disease Mother   . Stroke Father   . Heart disease Brother   . Esophageal cancer Neg Hx   . Stomach cancer Neg Hx   . Colon cancer Neg Hx    Family Status  Relation Status  . Mother Deceased  . Father Deceased  . Brother Deceased  . Son Alive  . Son Alive  . Son Alive  . Son Deceased at age 2       premature  . Son Deceased at age 49       reaction to  Penicillin   . Neg Hx (Not Specified)     Allergies  Allergen Reactions  . Penicillins Other (See Comments)    CHILDHOOD REACTION    Medications: Patient's Medications  New Prescriptions   No medications on file  Previous Medications   ANTISEPTIC ORAL RINSE (BIOTENE) LIQD    15 mLs by Mouth Rinse route. Take one tablespoon as needed up to 4 times a day   ASPIRIN 81 MG TABLET    Take 81 mg by mouth daily.   BUPROPION (WELLBUTRIN XL) 150 MG 24 HR TABLET    Take 450 mg by mouth daily.   CALCIUM CARBONATE-VITAMIN D (CALCIUM 600+D3 PO)    Take by mouth. Take 3 daily   CHLORPHENIRAMINE-HYDROCODONE (TUSSIONEX PENNKINETIC ER) 10-8 MG/5ML LQCR    Take one tsp every 12 hours if needed to control  cough   CHOLECALCIFEROL (VITAMIN D) 1000 UNITS TABLET    Take 1,000 Units by mouth daily.   CLONAZEPAM (KLONOPIN) 0.5 MG TABLET    Take 0.5 mg by mouth. Take one half  tablet in morning one  tablet in evening as needed for anxiety   GLUCOSAMINE HCL 1000 MG TABS    Take 2,000 mg by mouth 2 (two) times daily.   IBANDRONATE (BONIVA) 150 MG TABLET    Take 1 tablet (150 mg total) by mouth every 30 (thirty) days. Take in the morning with a full glass of water, on an empty stomach, and do not take anything else by mouth or lie down for the  next 30 min.   MELOXICAM (MOBIC) 7.5 MG TABLET    TAKE 1 TABLET BY MOUTH EVERY DAY AS NEEDED FOR ARTHRITIS   MENTHOL, TOPICAL ANALGESIC, (BIOFREEZE EX)    Apply topically. Apply topically four times daily   MULTIPLE VITAMIN (MULTIVITAMIN WITH MINERALS) TABS    Take 1 tablet by mouth daily.   NIFEDIPINE (PROCARDIA XL/ADALAT-CC) 60 MG 24 HR TABLET    One daily to control BP   PANTOPRAZOLE (PROTONIX) 40 MG TABLET    One daily to reduce stomach acid   POLYETHYL GLYCOL-PROPYL GLYCOL (SYSTANE) 0.4-0.3 % SOLN    Place 1 drop into both eyes 2 (two) times daily.    PRAVASTATIN (PRAVACHOL) 20 MG TABLET    TAKE 1 TABLET BY MOUTH EVERY DAY TO LOWER CHOLESTEROL   PROBIOTIC PRODUCT (PROBIOTIC PO)    Take 1 tablet by mouth daily.   VENLAFAXINE (EFFEXOR) 75 MG TABLET    Take 75 mg by mouth daily.  Modified Medications   No medications on file  Discontinued Medications   No medications on file    Review of Systems  Constitutional: Negative for fever.  HENT: Negative for facial swelling, postnasal drip and trouble swallowing.   Respiratory: Negative for shortness of breath, wheezing and stridor.   Cardiovascular: Negative for chest pain.  Musculoskeletal: Positive for gait problem.  Skin: Positive for color change and rash.  All other systems reviewed and are negative.   Vitals:   10/14/16 1156  BP: 122/68  Pulse: (!) 103  Temp: 97.8 F (36.6 C)  TempSrc: Oral  SpO2: 96%  Weight: 153 lb 9.6 oz (69.7 kg)  Height: 5' 3"  (1.6 m)   Body mass index is 27.21 kg/m.  Physical Exam  Constitutional: She is oriented to person, place, and time. She appears  well-developed and well-nourished.  HENT:  Mouth/Throat: Oropharynx is clear and moist. No oropharyngeal exudate.  MMM; no oral thrush; no angioedema; no facial/lip swelling  Eyes: Pupils are equal, round, and reactive to light. No scleral icterus.  Neck: Neck supple.  Cardiovascular: Normal rate, regular rhythm and intact distal pulses.  Exam reveals no gallop and no friction rub.   Murmur (1/6 SEM) heard. No LE edema b/l. No calf TTP  Pulmonary/Chest: Effort normal and breath sounds normal. No respiratory distress. She has no wheezes. She has no rales. She exhibits no tenderness.  No rhonchi  Lymphadenopathy:    She has no cervical adenopathy.  Neurological: She is alert and oriented to person, place, and time.  Skin: Skin is warm, dry and intact. Rash noted. There is erythema (blancheable and warm to touch; affects trunk, b/l UE/LE, above upper lip, neck).  Psychiatric: She has a normal mood and affect. Her behavior is normal. Judgment and thought content normal.      Labs reviewed: No visits with results within 3 Month(s) from this visit.  Latest known visit with results is:  Orders Only on 05/20/2016  Component Date Value Ref Range Status  . Sodium 05/23/2016 139  135 - 146 mmol/L Final  . Potassium 05/23/2016 4.6  3.5 - 5.3 mmol/L Final  . Chloride 05/23/2016 102  98 - 110 mmol/L Final  . CO2 05/23/2016 30  20 - 31 mmol/L Final  . Glucose, Bld 05/23/2016 98  65 - 99 mg/dL Final  . BUN 05/23/2016 16  7 - 25 mg/dL Final  . Creat 05/23/2016 0.91* 0.60 - 0.88 mg/dL Final   Comment:   For patients > or = 81  years of age: The upper reference limit for Creatinine is approximately 13% higher for people identified as African-American.     . Total Bilirubin 05/23/2016 0.4  0.2 - 1.2 mg/dL Final  . Alkaline Phosphatase 05/23/2016 59  33 - 130 U/L Final  . AST 05/23/2016 25  10 - 35 U/L Final  . ALT 05/23/2016 17  6 - 29 U/L Final  . Total Protein 05/23/2016 7.1  6.1 - 8.1 g/dL  Final  . Albumin 05/23/2016 4.2  3.6 - 5.1 g/dL Final  . Calcium 05/23/2016 9.8  8.6 - 10.4 mg/dL Final  . Cholesterol 05/23/2016 183  <200 mg/dL Final  . Triglycerides 05/23/2016 128  <150 mg/dL Final  . HDL 05/23/2016 67  >50 mg/dL Final  . Total CHOL/HDL Ratio 05/23/2016 2.7  <5.0 Ratio Final  . VLDL 05/23/2016 26  <30 mg/dL Final  . LDL Cholesterol 05/23/2016 90  <100 mg/dL Final    No results found.   Assessment/Plan   ICD-9-CM ICD-10-CM   1. Drug-induced skin rash 693.0 L27.0 CBC (no diff)     CMP with eGFR     Sedimentation Rate     C-reactive Protein     predniSONE (DELTASONE) 10 MG tablet     methylPREDNISolone acetate (DEPO-MEDROL) injection 40 mg  2. Allergic reaction to drug, subsequent encounter V58.89 T78.40XD ondansetron (ZOFRAN) 4 MG tablet   995.27  predniSONE (DELTASONE) 10 MG tablet  3. Gastroesophageal reflux disease without esophagitis 530.81 K21.9    Start prednisone taper on 10/15/16 (41m daily x 3-->344m->20mg-->10mg-->stop)  Depo-medrol 4018mnjection given today  Continue other medications as ordered  GO TO THE ER IF YOU DO NOT GET BETTER OR SYMPTOMS WORSEN (CHEST PAIN, FEELING SHORT OF BREATH, SWELLING OF LIPS/TONGUE/THROAT, WHEEZING, WORSENING RASH)  Follow up as scheduled with JesJanett Billowxt week  Handout "allergic reaction" given   Tina Knapke S. CarPerlie GoldieIntermountain Hospitald Adult Medicine [id number]eParksideC [phone] (Monday-Friday 8 AM - 5 PM) ([phone] 5 PM and follow prompts

## 2016-10-14 NOTE — Patient Instructions (Addendum)
Start prednisone taper on 10/15/16  Depo-medrol 40mg  injection given today  Continue other medications as ordered  GO TO THE ER IF YOU DO NOT GET BETTER OR SYMPTOMS WORSEN (CHEST PAIN, FEELING SHORT OF BREATH, SWELLING OF LIPS/TONGUE/THROAT, WHEEZING, WORSENING RASH)  Follow up as scheduled with Janett Billow next week.   Allergic Reaction An allergy is when your body reacts to a substance in a way that is not normal. An allergic reaction can happen after you:  Eat something.  Breathe in something.  Touch something.  You can be allergic to:  Things that are only around during certain seasons, like molds and pollens.  Foods.  Drugs.  Insects.  Animal dander.  What are the signs or symptoms?  Puffiness (swelling). This may happen on the lips, face, tongue, mouth, or throat.  Sneezing.  Coughing.  Breathing loudly (wheezing).  Stuffy nose.  Tingling in the mouth.  A rash.  Itching.  Itchy, red, puffy areas of skin (hives).  Watery eyes.  Throwing up (vomiting).  Watery poop (diarrhea).  Dizziness.  Feeling faint or fainting.  Trouble breathing or swallowing.  A tight feeling in the chest.  A fast heartbeat. How is this diagnosed? Allergies can be diagnosed with:  A medical and family history.  Skin tests.  Blood tests.  A food diary. A food diary is a record of all the foods, drinks, and symptoms you have each day.  The results of an elimination diet. This diet involves making sure not to eat certain foods and then seeing what happens when you start eating them again.  How is this treated? There is no cure for allergies, but allergic reactions can be treated with medicine. Severe reactions usually need to be treated at a hospital. How is this prevented? The best way to prevent an allergic reaction is to avoid the thing you are allergic to. Allergy shots and medicines can also help prevent reactions in some cases. This information is not  intended to replace advice given to you by your health care provider. Make sure you discuss any questions you have with your health care provider. Document Released: 08/27/2012 Document Revised: 12/28/2015 Document Reviewed: 02/11/2014 Elsevier Interactive Patient Education  [employer].

## 2016-10-14 NOTE — Telephone Encounter (Signed)
Patient called and stated that her Rash is worse today, more red in color. Having some itchy spots. Not as much Nausea today. Patient reports Vitals are fine. Patient feels like she needs to be seen. Reviewed last OV and Janett Billow stated that if the rash worsens to seek medical attention. Appointment scheduled with Dr. Eulas Post for today.

## 2016-10-15 LAB — COMPLETE METABOLIC PANEL WITH GFR
ALBUMIN: 3.3 g/dL — AB (ref 3.6–5.1)
ALT: 48 U/L — AB (ref 6–29)
AST: 38 U/L — AB (ref 10–35)
Alkaline Phosphatase: 72 U/L (ref 33–130)
BILIRUBIN TOTAL: 0.5 mg/dL (ref 0.2–1.2)
BUN: 21 mg/dL (ref 7–25)
CALCIUM: 8.7 mg/dL (ref 8.6–10.4)
CO2: 26 mmol/L (ref 20–31)
CREATININE: 0.98 mg/dL — AB (ref 0.60–0.88)
Chloride: 95 mmol/L — ABNORMAL LOW (ref 98–110)
GFR, EST AFRICAN AMERICAN: 59 mL/min — AB (ref >=60)
GFR, Est Non African American: 51 mL/min — ABNORMAL LOW (ref >=60)
Glucose, Bld: 106 mg/dL — ABNORMAL HIGH (ref 65–99)
Potassium: 4 mmol/L (ref 3.5–5.3)
Sodium: 130 mmol/L — ABNORMAL LOW (ref 135–146)
Total Protein: 5.6 g/dL — ABNORMAL LOW (ref 6.1–8.1)

## 2016-10-15 LAB — CBC
HEMATOCRIT: 33.7 % — AB (ref 35.0–45.0)
HEMOGLOBIN: 11 g/dL — AB (ref 11.7–15.5)
MCH: 30 pg (ref 27.0–33.0)
MCHC: 32.6 g/dL (ref 32.0–36.0)
MCV: 91.8 fL (ref 80.0–100.0)
MPV: 9.6 fL (ref 7.5–12.5)
Platelets: 314 10*3/uL (ref 140–400)
RBC: 3.67 MIL/uL — ABNORMAL LOW (ref 3.80–5.10)
RDW: 12.7 % (ref 11.0–15.0)
WBC: 19 10*3/uL — AB (ref 3.8–10.8)

## 2016-10-15 LAB — SEDIMENTATION RATE: SED RATE: 27 mm/h (ref 0–30)

## 2016-10-17 ENCOUNTER — Encounter: Payer: Self-pay | Admitting: Nurse Practitioner

## 2016-10-17 ENCOUNTER — Ambulatory Visit (INDEPENDENT_AMBULATORY_CARE_PROVIDER_SITE_OTHER): Payer: Medicare Other | Admitting: Nurse Practitioner

## 2016-10-17 VITALS — BP 132/82 | HR 88 | Temp 97.9°F | Resp 17 | Ht 63.0 in | Wt 150.0 lb

## 2016-10-17 DIAGNOSIS — D72829 Elevated white blood cell count, unspecified: Secondary | ICD-10-CM | POA: Diagnosis not present

## 2016-10-17 DIAGNOSIS — L270 Generalized skin eruption due to drugs and medicaments taken internally: Secondary | ICD-10-CM

## 2016-10-17 DIAGNOSIS — E871 Hypo-osmolality and hyponatremia: Secondary | ICD-10-CM | POA: Diagnosis not present

## 2016-10-17 LAB — CBC WITH DIFFERENTIAL/PLATELET
BASOS ABS: 0 {cells}/uL (ref 0–200)
BASOS PCT: 0 %
Eosinophils Absolute: 0 cells/uL — ABNORMAL LOW (ref 15–500)
Eosinophils Relative: 0 %
HCT: 36.1 % (ref 35.0–45.0)
HEMOGLOBIN: 11.8 g/dL (ref 11.7–15.5)
LYMPHS ABS: 792 {cells}/uL — AB (ref 850–3900)
Lymphocytes Relative: 9 %
MCH: 30.2 pg (ref 27.0–33.0)
MCHC: 32.7 g/dL (ref 32.0–36.0)
MCV: 92.3 fL (ref 80.0–100.0)
MONOS PCT: 6 %
MPV: 9.3 fL (ref 7.5–12.5)
Monocytes Absolute: 528 cells/uL (ref 200–950)
NEUTROS ABS: 7480 {cells}/uL (ref 1500–7800)
Neutrophils Relative %: 85 %
PLATELETS: 426 10*3/uL — AB (ref 140–400)
RBC: 3.91 MIL/uL (ref 3.80–5.10)
RDW: 12.9 % (ref 11.0–15.0)
WBC: 8.8 10*3/uL (ref 3.8–10.8)

## 2016-10-17 LAB — C-REACTIVE PROTEIN: CRP: 105.9 mg/L — AB (ref <8.0)

## 2016-10-17 NOTE — Progress Notes (Signed)
Careteam: Patient Care Team: Estill Dooms, MD as PCP - General (Internal Medicine) Melina Modena, [facility] Clent Jacks, MD as Consulting Physician (Ophthalmology) Milus Banister, MD as Consulting Physician (Gastroenterology) Norma Fredrickson, MD as Consulting Physician (Psychiatry) Jessy Oto, MD as Consulting Physician (Orthopedic Surgery) Bjorn Loser, MD as Consulting Physician (Urology)  Advanced Directive information Does Patient Have a Medical Advance Directive?: Yes, Type of Advance Directive: [place];Living will;Out of facility DNR (pink MOST or yellow form), Pre-existing out of facility DNR order (yellow form or pink MOST form): Pink MOST form placed in chart (order not valid for inpatient use)  Allergies  Allergen Reactions  . Penicillins Other (See Comments)    CHILDHOOD REACTION    Chief Complaint  Patient presents with  . Follow-up    Pt is being seen for a follow up on rash.      HPI: Patient is a 83 y.o. female seen in the office today pt was seen on 10/13/16 and rash noted on truck but she was not having symptoms from this. On 10/14/16 she called office and reported rash was itching and reports area was redder and therefore she was seen again. She was given Depo-medrol and prednisone taper given. Rash is much better. Redness significantly reduced and itching is better.  CBC revealed elevated white count with anemia.  Also noted to have hyponatremia and elevated liver enzymes.  Drinks a glass of wine a night but has not had any since rash  Review of Systems:  Review of Systems  Constitutional: Negative for chills, fever and malaise/fatigue.  Respiratory: Negative for cough.   Cardiovascular: Negative for chest pain, palpitations and leg swelling.  Gastrointestinal: Negative for abdominal pain, constipation, diarrhea, heartburn, nausea and vomiting.  Genitourinary: Negative for dysuria, frequency and urgency.  Skin: Positive for  itching and rash.       improved  Neurological: Positive for weakness. Negative for dizziness and headaches.  Psychiatric/Behavioral: Positive for memory loss.    Past Medical History:  Diagnosis Date  . Abnormal mammogram, unspecified 04/19/2011  . Allergy   . Anxiety   . Backache, unspecified 01/21/2005  . Cervicalgia 08/20/2013  . Chronic kidney disease, stage II (mild) 04/03/2009  . Chronic rhinitis 09/28/2010  . DDD (degenerative disc disease)   . Depression   . Dermatophytosis of nail 01/21/2005  . Diverticulosis of colon (without mention of hemorrhage) 06/17/2005  . Edema 11/26/2004  . GERD (gastroesophageal reflux disease)   . Hiatal hernia   . High cholesterol   . Hyperglycemia 11/18/2014  . Hypertension   . Insomnia, unspecified 10/18/2011  . Left inguinal hernia 04/24/2014  . Nocturia 09/19/2008  . Osteoporosis, senile 11/18/2014  . Other abnormal blood chemistry 04/03/2007  . Pain in joint, pelvic region and thigh 11/12/2011  . Pain in joint, shoulder region 08/15/2006  . Pain in limb 08/26/2005  . Palpitations 09/29/2009  . PAOD (peripheral arterial occlusive disease) ([place]) 11/18/2014   Right leg; diminished popliteal, dorsalis pedis, and posterior tibial artery pulsations   . Postmenopausal atrophic vaginitis 12/14/2004  . Scoliosis   . Senile osteoporosis 12/14/2004  . Tension headache 10/04/2005  . Unspecified cataract 11/26/2004  . Unspecified constipation 01/21/2005  . Unspecified urinary incontinence 11/26/2004  . Vaginal stenosis 11/18/2014   Tight band about 1/2 inches into the vagina which will not allow passage of my index finger.    Past Surgical History:  Procedure Laterality Date  . [place]  Dr.Braun   . bilateral eyelid surgery  2005   Dr.Scott  . BILATERAL OOPHORECTOMY  2002  . BREAST SURGERY     benign tumor removal, left breast 1969  . CATARACT EXTRACTION     Right  . CHOLECYSTECTOMY  1990   Dr.Moore   . COLONOSCOPY  3/18/20008    inflammation, diverticular associated colitis -Dr. Ardis Hughs  . COSMETIC SURGERY    . ESOPHAGOGASTRODUODENOSCOPY N/A 01/24/2014   Procedure: ESOPHAGOGASTRODUODENOSCOPY (EGD);  Surgeon: Irene Shipper, MD;  Location[facility] ENDOSCOPY;  Service: Endoscopy;  Laterality: N/A;  . ESOPHAGOGASTRODUODENOSCOPY ENDOSCOPY  04/04/14   Dr. Ardis Hughs  . EYE SURGERY    . FOREIGN BODY REMOVAL N/A 01/24/2014   Procedure: FOREIGN BODY REMOVAL;  Surgeon: Irene Shipper, MD;  Location: [place];  Service: Endoscopy;  Laterality: N/A;  . HIP PINNING,CANNULATED  11/07/2011   Procedure: CANNULATED HIP PINNING;  Surgeon: Jessy Oto, MD;  Location: [facility];  Service: Orthopedics;  Laterality: Left;  . ORIF FEMORAL NECK FRACTURE W/ [facility]  11/07/2011   Dr.Nitka   . TONSILLECTOMY     Dr.Joe Tamala Julian  . TUBAL LIGATION    . VAGINAL PROLAPSE REPAIR  2002   Dr.Horback    Social History:   reports that she has never smoked. She has never used smokeless tobacco. She reports that she drinks about 4.2 oz of alcohol per week . She reports that she does not use drugs.  Family History  Problem Relation Age of Onset  . Heart disease Mother   . Stroke Father   . Heart disease Brother   . Esophageal cancer Neg Hx   . Stomach cancer Neg Hx   . Colon cancer Neg Hx     Medications: Patient's Medications  New Prescriptions   No medications on file  Previous Medications   ANTISEPTIC ORAL RINSE (BIOTENE) LIQD    15 mLs by Mouth Rinse route. Take one tablespoon as needed up to 4 times a day   ASPIRIN 81 MG TABLET    Take 81 mg by mouth daily.   BUPROPION (WELLBUTRIN XL) 150 MG 24 HR TABLET    Take 450 mg by mouth daily.   CALCIUM CARB-CHOLECALCIFEROL (CALTRATE 600+D) 600-800 MG-UNIT TABS    Take 1 tablet by mouth 2 (two) times daily.   CHLORPHENIRAMINE-HYDROCODONE (TUSSIONEX PENNKINETIC ER) 10-8 MG/5ML LQCR    Take one tsp every 12 hours if needed to control  cough   CHOLECALCIFEROL (VITAMIN D) 1000 UNITS TABLET    Take 1,000 Units by  mouth daily.   CLONAZEPAM (KLONOPIN) 0.5 MG TABLET    Take 0.5 mg by mouth. Take one half  tablet in morning one tablet in evening as needed for anxiety   GLUCOSAMINE HCL 1000 MG TABS    Take 2,000 mg by mouth 2 (two) times daily.   IBANDRONATE (BONIVA) 150 MG TABLET    Take 1 tablet (150 mg total) by mouth every 30 (thirty) days. Take in the morning with a full glass of water, on an empty stomach, and do not take anything else by mouth or lie down for the next 30 min.   MELOXICAM (MOBIC) 7.5 MG TABLET    TAKE 1 TABLET BY MOUTH EVERY DAY AS NEEDED FOR ARTHRITIS   MENTHOL, TOPICAL ANALGESIC, (BIOFREEZE EX)    Apply topically. Apply topically four times daily   MULTIPLE VITAMIN (MULTIVITAMIN WITH MINERALS) TABS    Take 1 tablet by mouth daily.   NIFEDIPINE (PROCARDIA XL/ADALAT-CC) 60 MG  24 HR TABLET    One daily to control BP   ONDANSETRON (ZOFRAN) 4 MG TABLET    Take 1 tablet (4 mg total) by mouth every 8 (eight) hours as needed for nausea or vomiting.   PANTOPRAZOLE (PROTONIX) 40 MG TABLET    One daily to reduce stomach acid   POLYETHYL GLYCOL-PROPYL GLYCOL (SYSTANE) 0.4-0.3 % SOLN    Place 1 drop into both eyes 2 (two) times daily.    PRAVASTATIN (PRAVACHOL) 20 MG TABLET    TAKE 1 TABLET BY MOUTH EVERY DAY TO LOWER CHOLESTEROL   PREDNISONE (DELTASONE) 10 MG TABLET    START ON 10/15/16. Take 4 tabs po daily x 3 days then 3 tabs daily x 3 days then 2 tabs daily x 3 days then 1 tab daily x 3 days and stop   PROBIOTIC PRODUCT (PROBIOTIC PO)    Take 1 tablet by mouth daily.   VENLAFAXINE (EFFEXOR) 75 MG TABLET    Take 75 mg by mouth daily.  Modified Medications   No medications on file  Discontinued Medications   CALCIUM CARBONATE-VITAMIN D (CALCIUM 600+D3 PO)    Take by mouth. Take 3 daily     Physical Exam:  Vitals:   10/17/16 1442  BP: 132/82  Pulse: 88  Resp: 17  Temp: 97.9 F (36.6 C)  TempSrc: Oral  SpO2: 95%  Weight: 150 lb (68 kg)  Height: 5\' 3"  (1.6 m)   Body mass index is  26.57 kg/m.  Physical Exam  Constitutional: She is oriented to person, place, and time. She appears well-developed and well-nourished.  HENT:  Mouth/Throat: Oropharynx is clear and moist. No oropharyngeal exudate.  MMM; no oral thrush; no angioedema; no facial/lip swelling  Eyes: Pupils are equal, round, and reactive to light. No scleral icterus.  Neck: Neck supple.  Cardiovascular: Normal rate, regular rhythm and intact distal pulses.  Exam reveals no gallop and no friction rub.   Murmur (1/6 SEM) heard. No LE edema b/l. No calf TTP  Pulmonary/Chest: Effort normal and breath sounds normal. No respiratory distress. She has no wheezes. She has no rales. She exhibits no tenderness.  No rhonchi  Lymphadenopathy:    She has no cervical adenopathy.  Neurological: She is alert and oriented to person, place, and time.  Skin: Skin is warm, dry and intact. Rash noted. There is erythema (significantly improved to trunk and upper extermities and neck).  Psychiatric: She has a normal mood and affect. Her behavior is normal. Judgment and thought content normal.    Labs reviewed: Basic Metabolic Panel:  Recent Labs  11/26/15 05/23/16 0800 10/14/16 1337  NA 141 139 130*  K 5.1 4.6 4.0  CL  --  102 95*  CO2  --  30 26  GLUCOSE  --  98 106*  BUN 18 16 21   CREATININE 0.9 0.91* 0.98*  CALCIUM  --  9.8 8.7   Liver Function Tests:  Recent Labs  11/26/15 05/23/16 0800 10/14/16 1337  AST 21 25 38*  ALT 16 17 48*  ALKPHOS 58 59 72  BILITOT  --  0.4 0.5  PROT  --  7.1 5.6*  ALBUMIN  --  4.2 3.3*   No results for input(s): LIPASE, AMYLASE in the last 8760 hours. No results for input(s): AMMONIA in the last 8760 hours. CBC:  Recent Labs  10/14/16 1337  WBC 19.0*  HGB 11.0*  HCT 33.7*  MCV 91.8  PLT 314   Lipid Panel:  Recent Labs  11/26/15 05/23/16 0800  CHOL 162 183  HDL 65 67  LDLCALC 81 90  TRIG 81 128  CHOLHDL  --  2.7   TSH: No results for input(s): TSH in the  last 8760 hours. A1C: No results found for: HGBA1C   Assessment/Plan 1. Drug-induced skin rash -improved, to cont prednisone taper - COMPLETE METABOLIC PANEL WITH GFR - CBC with Differential/Platelets  2. Leukocytosis, unspecified type -no fevers/chills. Feeling much better. Will follow up - CBC with Differential/Platelets  3. Hyponatremia - COMPLETE METABOLIC PANEL WITH GFR  Tina Hampton K. Harle Battiest  [facility] [phone] 8 am - 5 pm) [phone] (after hours)

## 2016-10-18 LAB — COMPLETE METABOLIC PANEL WITH GFR
ALT: 37 U/L — ABNORMAL HIGH (ref 6–29)
AST: 25 U/L (ref 10–35)
Albumin: 3.6 g/dL (ref 3.6–5.1)
Alkaline Phosphatase: 61 U/L (ref 33–130)
BILIRUBIN TOTAL: 0.2 mg/dL (ref 0.2–1.2)
BUN: 13 mg/dL (ref 7–25)
CO2: 25 mmol/L (ref 20–31)
Calcium: 9.6 mg/dL (ref 8.6–10.4)
Chloride: 100 mmol/L (ref 98–110)
Creat: 0.78 mg/dL (ref 0.60–0.88)
GFR, EST AFRICAN AMERICAN: 78 mL/min (ref >=60)
GFR, EST NON AFRICAN AMERICAN: 68 mL/min (ref >=60)
GLUCOSE: 172 mg/dL — AB (ref 65–99)
POTASSIUM: 4.5 mmol/L (ref 3.5–5.3)
SODIUM: 137 mmol/L (ref 135–146)
Total Protein: 6.2 g/dL (ref 6.1–8.1)

## 2016-10-25 ENCOUNTER — Non-Acute Institutional Stay: Payer: Medicare Other | Admitting: Internal Medicine

## 2016-10-25 ENCOUNTER — Encounter: Payer: Self-pay | Admitting: Internal Medicine

## 2016-10-25 VITALS — BP 140/76 | HR 82 | Temp 97.5°F | Ht 63.0 in | Wt 151.0 lb

## 2016-10-25 DIAGNOSIS — G8929 Other chronic pain: Secondary | ICD-10-CM

## 2016-10-25 DIAGNOSIS — I10 Essential (primary) hypertension: Secondary | ICD-10-CM

## 2016-10-25 DIAGNOSIS — H9193 Unspecified hearing loss, bilateral: Secondary | ICD-10-CM | POA: Diagnosis not present

## 2016-10-25 DIAGNOSIS — M545 Low back pain, unspecified: Secondary | ICD-10-CM

## 2016-10-25 DIAGNOSIS — R739 Hyperglycemia, unspecified: Secondary | ICD-10-CM | POA: Diagnosis not present

## 2016-10-25 DIAGNOSIS — R21 Rash and other nonspecific skin eruption: Secondary | ICD-10-CM | POA: Diagnosis not present

## 2016-10-25 DIAGNOSIS — R251 Tremor, unspecified: Secondary | ICD-10-CM

## 2016-10-25 DIAGNOSIS — F3340 Major depressive disorder, recurrent, in remission, unspecified: Secondary | ICD-10-CM

## 2016-10-25 DIAGNOSIS — E7800 Pure hypercholesterolemia, unspecified: Secondary | ICD-10-CM

## 2016-10-25 DIAGNOSIS — H9190 Unspecified hearing loss, unspecified ear: Secondary | ICD-10-CM | POA: Insufficient documentation

## 2016-10-25 DIAGNOSIS — K219 Gastro-esophageal reflux disease without esophagitis: Secondary | ICD-10-CM

## 2016-10-25 DIAGNOSIS — M810 Age-related osteoporosis without current pathological fracture: Secondary | ICD-10-CM

## 2016-10-25 NOTE — Progress Notes (Signed)
Facility  [facility]    Place of Service: Clinic (12)     Allergies  Allergen Reactions  . Penicillins Other (See Comments)    CHILDHOOD REACTION    Chief Complaint  Patient presents with  . Medical Management of Chronic Issues    6 month medication management blood pressure, tremor, depression, cholesterol, hyperglycemia.     HPI:  Essential hypertension - controlled  Tremor - got worse when taking prednisone, but is getting back to her usual state.  Recurrent major depressive disorder, in remission ([place]) - was tried on aripiprazole by Dr. Casimiro Needle, but appears to have reacted to it with nausea and then a rash. No longer using.   High cholesterol - controlled  Hyperglycemia - glucose to 172 when on prednisone. She is on the last doses now.   Rash - probably a reaction to aripirazole. Doing better since on prednisone.  Bilateral hearing loss, unspecified hearing loss type - has apt with [facility] for reevaluation of hearing  Gastroesophageal reflux disease without esophagitis - asymptomatic  Chronic bilateral low back pain without sciatica - improved on prednisone  Osteoporosis, senile - continues on Boniva and calcium    Medications: Patient's Medications  New Prescriptions   No medications on file  Previous Medications   ANTISEPTIC ORAL RINSE (BIOTENE) LIQD    15 mLs by Mouth Rinse route. Take one tablespoon as needed up to 4 times a day   ASPIRIN 81 MG TABLET    Take 81 mg by mouth daily.   BUPROPION (WELLBUTRIN XL) 150 MG 24 HR TABLET    Take 450 mg by mouth daily.   CALCIUM CARB-CHOLECALCIFEROL (CALTRATE 600+D) 600-800 MG-UNIT TABS    Take 1 tablet by mouth 2 (two) times daily.   CHLORPHENIRAMINE-HYDROCODONE (TUSSIONEX PENNKINETIC ER) 10-8 MG/5ML LQCR    Take one tsp every 12 hours if needed to control  cough   CHOLECALCIFEROL (VITAMIN D) 1000 UNITS TABLET    Take 1,000 Units by mouth daily.   CLONAZEPAM (KLONOPIN) 0.5 MG TABLET    Take 0.5 mg by mouth. Take one half   tablet in morning one tablet in evening as needed for anxiety   GLUCOSAMINE HCL 1000 MG TABS    Take 2,000 mg by mouth 2 (two) times daily.   IBANDRONATE (BONIVA) 150 MG TABLET    Take 1 tablet (150 mg total) by mouth every 30 (thirty) days. Take in the morning with a full glass of water, on an empty stomach, and do not take anything else by mouth or lie down for the next 30 min.   MELOXICAM (MOBIC) 7.5 MG TABLET    TAKE 1 TABLET BY MOUTH EVERY DAY AS NEEDED FOR ARTHRITIS   MENTHOL, TOPICAL ANALGESIC, (BIOFREEZE EX)    Apply topically. Apply topically four times daily   MULTIPLE VITAMIN (MULTIVITAMIN WITH MINERALS) TABS    Take 1 tablet by mouth daily.   NIFEDIPINE (PROCARDIA XL/ADALAT-CC) 60 MG 24 HR TABLET    One daily to control BP   PANTOPRAZOLE (PROTONIX) 40 MG TABLET    One daily to reduce stomach acid   POLYETHYL GLYCOL-PROPYL GLYCOL (SYSTANE) 0.4-0.3 % SOLN    Place 1 drop into both eyes 2 (two) times daily.    PRAVASTATIN (PRAVACHOL) 20 MG TABLET    TAKE 1 TABLET BY MOUTH EVERY DAY TO LOWER CHOLESTEROL   PREDNISONE (DELTASONE) 10 MG TABLET    START ON 10/15/16. Take 4 tabs po daily x 3 days then 3 tabs  daily x 3 days then 2 tabs daily x 3 days then 1 tab daily x 3 days and stop   VENLAFAXINE (EFFEXOR) 75 MG TABLET    Take 75 mg by mouth daily.  Modified Medications   No medications on file  Discontinued Medications   ONDANSETRON (ZOFRAN) 4 MG TABLET    Take 1 tablet (4 mg total) by mouth every 8 (eight) hours as needed for nausea or vomiting.   PROBIOTIC PRODUCT (PROBIOTIC PO)    Take 1 tablet by mouth daily.     Review of Systems  Constitutional: Negative.  Negative for unexpected weight change.  HENT: Positive for hearing loss (bilateral). Negative for ear pain.   Eyes: Positive for visual disturbance (corrective lenses).  Respiratory: Negative for cough.   Cardiovascular: Positive for leg swelling (both ankles).       Aortic ejection murmur noted 04/20/14.  Gastrointestinal:  Positive for abdominal pain (Left lower quadrant).       Heartburn. Constipation. History of hiatal hernia with 1/2 stomach in the chest. Impaction of meat in the esophagus 01/24/14. EGD with dilation of esophageal stricture by Dr. Ardis Hughs on 04/04/14.  Left inguinal hernia on 04/20/14.  Endocrine: Negative for polydipsia, polyphagia and polyuria.       History of mild hyperglycemia in September 2015 and June 2016. Glucose to 172 on prednisone in June 2018.  Genitourinary: Negative for hematuria.       Nocturia x5-6. Increased frequency in the daytime. Urge incontinence. Hx urinary tract infection. Vaginal atrophy and stenosis  Musculoskeletal: Positive for neck pain and neck stiffness.       Chronic mild back discomfort. Some joint pains. Pain in the left scapular area inferiorly below the scapular spine. Shoulder is crepitant, but ROM is unimpaired.  Skin: Negative for rash.  Allergic/Immunologic: Negative.   Neurological:       11/18/14 MMSE 30/30. Passed clock drawing.  Hematological: Negative.   Psychiatric/Behavioral: Positive for dysphoric mood and sleep disturbance. The patient is nervous/anxious.        Insomnia..    Vitals:   10/25/16 1000  BP: 140/76  Pulse: 82  Temp: 97.5 F (36.4 C)  TempSrc: Oral  SpO2: 95%  Weight: 151 lb (68.5 kg)  Height: '5\' 3"'$  (1.6 m)   Wt Readings from Last 3 Encounters:  10/25/16 151 lb (68.5 kg)  10/17/16 150 lb (68 kg)  10/14/16 153 lb 9.6 oz (69.7 kg)    Body mass index is 26.75 kg/m.  Physical Exam  Constitutional: She is oriented to person, place, and time. She appears well-developed and well-nourished. No distress.  HENT:  Head: Normocephalic.  Right Ear: External ear normal.  Left Ear: External ear normal.  Nose: Nose normal.  Mouth/Throat: Oropharynx is clear and moist.   Mild hearing loss bilaterally.  Eyes:  Wears corrective lenses. Left cataract present. Right pupil seems to be nonreactive to light. There are 2 white  cystic areas on the left upper eyelid medially. 2 additional areas are present on the left lower lid centrally.  Neck: Neck supple. No JVD present. No tracheal deviation present. No thyromegaly present.  Cardiovascular: Normal rate and regular rhythm.  Exam reveals no gallop and no friction rub.   Murmur (3/6 aortic ejection murmur with radiation towards left sternal border as well. ) heard. Diminished pulsation at right popliteal, right posterior tibial, and right dorsalis pedis.  Pulmonary/Chest: Breath sounds normal. No respiratory distress. She has no wheezes. She has no rales. She exhibits no  tenderness.  Cough and bronchial rattle  Abdominal: She exhibits no distension and no mass. There is no tenderness.  Not tender in the left groin or left lower quadrant. Small bulge in the left groin.  Genitourinary: Rectal exam shows guaiac negative stool. No vaginal discharge found.  Genitourinary Comments: Prior exam:  approximately 1-1/2 inches into the vagina, there is a thin tissue band at is circumferential and narrows the vagina. I'm unable to penetrate through the band with my index finger. There is no tenderness, bleeding, or other excessive tissue growth in this area.  Musculoskeletal: She exhibits edema.  Flat feet with collapsed arches. Unstable gait. Using cane. Pain in the left scapular area under the scapular spine.  Neurological: She is alert and oriented to person, place, and time. She has normal reflexes. No cranial nerve deficit. Coordination normal.  05/31/16 MMSE 30/30. Passed clock drawing.  Skin: No rash noted. No erythema. No pallor.  Multiple seborrheic keratoses.  Psychiatric: She has a normal mood and affect. Her behavior is normal. Judgment and thought content normal.     Labs reviewed: Lab Summary Latest Ref Rng & Units 10/17/2016 10/14/2016 05/23/2016  Hemoglobin 11.7 - 15.5 g/dL 11.8 11.0(L) (None)  Hematocrit 35.0 - 45.0 % 36.1 33.7(L) (None)  White count 3.8 - 10.8  K/uL 8.8 19.0(H) (None)  Platelet count 140 - 400 K/uL 426(H) 314 (None)  Sodium 135 - 146 mmol/L 137 130(L) 139  Potassium 3.5 - 5.3 mmol/L 4.5 4.0 4.6  Calcium 8.6 - 10.4 mg/dL 9.6 8.7 9.8  Phosphorus - (None) (None) (None)  Creatinine 0.60 - 0.88 mg/dL 0.78 0.98(H) 0.91(H)  AST 10 - 35 U/L 25 38(H) 25  Alk Phos 33 - 130 U/L 61 72 59  Bilirubin 0.2 - 1.2 mg/dL 0.2 0.5 0.4  Glucose 65 - 99 mg/dL 172(H) 106(H) 98  Cholesterol <200 mg/dL (None) (None) 183  HDL cholesterol >50 mg/dL (None) (None) 67  Triglycerides <150 mg/dL (None) (None) 128  LDL Direct - (None) (None) (None)  LDL Calc <100 mg/dL (None) (None) 90  Total protein 6.1 - 8.1 g/dL 6.2 5.6(L) 7.1  Albumin 3.6 - 5.1 g/dL 3.6 3.3(L) 4.2  Some recent data might be hidden   Lab Results  Component Value Date   TSH 2.90 05/21/2015   Lab Results  Component Value Date   BUN 13 10/17/2016   BUN 21 10/14/2016   BUN 16 05/23/2016   Lab Results  Component Value Date   CREATININE 0.78 10/17/2016   CREATININE 0.98 (H) 10/14/2016   CREATININE 0.91 (H) 05/23/2016   No results found for: HGBA1C     Assessment/Plan  1. Essential hypertension The current medical regimen is effective;  continue present plan and medications. - Comprehensive metabolic panel; Future  2. Tremor stable  3. Recurrent major depressive disorder, in remission [facility]) The current medical regimen is effective;  continue present plan and medications.  4. High cholesterol The current medical regimen is effective;  continue present plan and medications. - Lipid panel; Future  5. Hyperglycemia -CMP, future  6. Rash improved - CBC with Differential/Platelet; Future  7. Bilateral hearing loss, unspecified hearing loss type Refer to [facility]   8. Gastroesophageal reflux disease without esophagitis The current medical regimen is effective;  continue present plan and medications.  9. Chronic bilateral low back pain without sciatica stable  10.  Osteoporosis, senile The current medical regimen is effective;  continue present plan and medications.

## 2016-11-02 DIAGNOSIS — H903 Sensorineural hearing loss, bilateral: Secondary | ICD-10-CM | POA: Diagnosis not present

## 2016-11-11 NOTE — Addendum Note (Signed)
Addended by: Royann Shivers A on: 11/11/2016 04:03 PM   Modules accepted: Orders

## 2016-11-16 ENCOUNTER — Other Ambulatory Visit: Payer: Self-pay | Admitting: Internal Medicine

## 2016-11-16 DIAGNOSIS — I10 Essential (primary) hypertension: Secondary | ICD-10-CM

## 2016-12-13 DIAGNOSIS — H903 Sensorineural hearing loss, bilateral: Secondary | ICD-10-CM | POA: Diagnosis not present

## 2016-12-19 ENCOUNTER — Encounter (INDEPENDENT_AMBULATORY_CARE_PROVIDER_SITE_OTHER): Payer: Self-pay | Admitting: Specialist

## 2016-12-19 ENCOUNTER — Ambulatory Visit (INDEPENDENT_AMBULATORY_CARE_PROVIDER_SITE_OTHER): Payer: Medicare Other | Admitting: Specialist

## 2016-12-19 VITALS — BP 156/84 | HR 91 | Ht 63.0 in | Wt 155.0 lb

## 2016-12-19 DIAGNOSIS — M5136 Other intervertebral disc degeneration, lumbar region: Secondary | ICD-10-CM

## 2016-12-19 DIAGNOSIS — M810 Age-related osteoporosis without current pathological fracture: Secondary | ICD-10-CM | POA: Diagnosis not present

## 2016-12-19 DIAGNOSIS — M4155 Other secondary scoliosis, thoracolumbar region: Secondary | ICD-10-CM | POA: Diagnosis not present

## 2016-12-19 MED ORDER — IBANDRONATE SODIUM 150 MG PO TABS: 150.0000 mg | ORAL_TABLET | ORAL | 3 refills | Status: DC

## 2016-12-19 NOTE — Progress Notes (Signed)
Office Visit Note   Patient: Tina Hampton           Date of Birth: 09/28/1925           MRN: [id number] Visit Date: 12/19/2016              Requested by: Estill Dooms, MD [address] [place], [place] [address] PCP: Blanchie Serve, MD   Assessment & Plan: Visit Diagnoses:  1. Age-related osteoporosis without current pathological fracture   2. Other secondary scoliosis, thoracolumbar region   3. DDD (degenerative disc disease), lumbar     Plan: Avoid frequent bending and stooping  No lifting greater than 10 lbs. May use ice or moist heat for pain. Weight loss is of benefit. Handicap license is approved.Try sholder stretching and strengthening exercises. Tylenol arthritis strength one up to TID. Continue with exercises, have the bone density test done tomorrow.  Continue with boniva for now, will probably need to change medications in the next 1-2 year due to the risk of  Fracture on the subtrochanteric area of the femur.   Follow-Up Instructions: No Follow-up on file.   Orders:  No orders of the defined types were placed in this encounter.  No orders of the defined types were placed in this encounter.     Procedures: No procedures performed   Clinical Data: No additional findings.   Subjective: Chief Complaint  Patient presents with  . Lower Back - Follow-up    83 year old female right handed, with history of bilateral shoulder pain and discomfort with overhead use of the arms and overhead lifting. She has osteoporosis T score -2.4 in 2014 and -3.0 in 2016, now on boniva. She exercises three time a week in the swimming pool. She has a collapsing degenerative scoliosis and the bone density is important to control to prevent curve progressive. She has had mammogram and is scheduled for a Bone Density scan tomorrow at 2 PM.     Review of Systems  Constitutional: Negative.   HENT: Negative.   Eyes: Negative.   Respiratory: Negative.   Cardiovascular:  Negative.   Gastrointestinal: Negative.   Endocrine: Negative.   Genitourinary: Negative.   Musculoskeletal: Negative.   Skin: Negative.   Allergic/Immunologic: Negative.   Neurological: Negative.   Hematological: Negative.   Psychiatric/Behavioral: Negative.      Objective: Vital Signs: BP (!) 156/84   Pulse 91   Ht 5\' 3"  (1.6 m)   Wt 155 lb (70.3 kg)   BMI 27.46 kg/m   Physical Exam  Constitutional: She is oriented to person, place, and time. She appears well-developed and well-nourished.  HENT:  Head: Normocephalic and atraumatic.  Eyes: Pupils are equal, round, and reactive to light. EOM are normal.  Neck: Normal range of motion. Neck supple.  Pulmonary/Chest: Effort normal and breath sounds normal.  Abdominal: Soft. Bowel sounds are normal.  Neurological: She is alert and oriented to person, place, and time.  Skin: Skin is warm and dry.  Psychiatric: She has a normal mood and affect. Her behavior is normal. Judgment and thought content normal.    Back Exam   Tenderness  The patient is experiencing tenderness in the lumbar.  Range of Motion  Extension: abnormal  Flexion: normal  Lateral Bend Right: abnormal  Lateral Bend Left: abnormal  Rotation Right: abnormal  Rotation Left: abnormal   Muscle Strength  Right Quadriceps:  5/5  Left Quadriceps:  5/5  Right Hamstrings:  5/5  Left Hamstrings:  5/5   Tests  Straight leg raise right: negative Straight leg raise left: negative  Reflexes  Patellar: normal Achilles: normal Babinski's sign: normal   Other  Heel Walk: normal Sensation: normal Gait: abnormal    Right Shoulder Exam   Tenderness  The patient is experiencing tenderness in the acromion.  Range of Motion  Internal Rotation 0 degrees: normal   Muscle Strength  Abduction: 5/5  Internal Rotation: 5/5  External Rotation: 5/5  Supraspinatus: 5/5  Subscapularis: 5/5  Biceps: 5/5   Tests  Impingement: positive  Other  Erythema:  absent Scars: absent Sensation: normal Pulse: present   Left Shoulder Exam   Tenderness  The patient is experiencing tenderness in the acromion and biceps tendon.  Range of Motion  Active Abduction:  150 abnormal  Passive Abduction:  150 abnormal  Extension:  40 abnormal  Forward Flexion: 100  External Rotation: 60  Internal Rotation 0 degrees: normal   Muscle Strength  Abduction: 5/5  Internal Rotation: 5/5  External Rotation: 5/5  Supraspinatus: 5/5  Subscapularis: 5/5  Biceps: 5/5   Tests  Impingement: positive  Other  Erythema: absent Scars: absent Sensation: normal Pulse: present       Specialty Comments:  No specialty comments available.  Imaging: No results found.   PMFS History: Patient Active Problem List   Diagnosis Date Noted  . Rash 10/25/2016  . Hearing loss 10/25/2016  . Chronic bilateral low back pain without sciatica 08/17/2016  . Pain in joint, shoulder region 05/31/2016  . Sprain of anterior talofibular ligament 04/27/2016  . Pain of left scapula 10/20/2015  . Tremor 06/02/2015  . Vaginal stenosis 11/18/2014  . PAOD (peripheral arterial occlusive disease) ([place]) 11/18/2014  . Osteoporosis, senile 11/18/2014  . Hyperglycemia 11/18/2014  . Left inguinal hernia 04/24/2014  . Diverticulosis of colon with hemorrhage 04/24/2014  . Aortic ejection murmur 04/24/2014  . Dysphagia 01/24/2014  . Esophageal stricture 01/24/2014  . Hip fracture ([place]) 11/07/2011  . GERD (gastroesophageal reflux disease)   . Hypertension   . High cholesterol   . Hiatal hernia   . Scoliosis   . DDD (degenerative disc disease)   . Depression   . Senile osteoporosis 12/14/2004  . Postmenopausal atrophic vaginitis 12/14/2004  . Edema 11/26/2004  . Urinary incontinence 11/26/2004   Past Medical History:  Diagnosis Date  . Abnormal mammogram, unspecified 04/19/2011  . Allergy   . Anxiety   . Backache, unspecified 01/21/2005  . Cervicalgia 08/20/2013  .  Chronic kidney disease, stage II (mild) 04/03/2009  . Chronic rhinitis 09/28/2010  . DDD (degenerative disc disease)   . Depression   . Dermatophytosis of nail 01/21/2005  . Diverticulosis of colon (without mention of hemorrhage) 06/17/2005  . Edema 11/26/2004  . GERD (gastroesophageal reflux disease)   . Hiatal hernia   . High cholesterol   . Hyperglycemia 11/18/2014  . Hypertension   . Insomnia, unspecified 10/18/2011  . Left inguinal hernia 04/24/2014  . Nocturia 09/19/2008  . Osteoporosis, senile 11/18/2014  . Other abnormal blood chemistry 04/03/2007  . Pain in joint, pelvic region and thigh 11/12/2011  . Pain in joint, shoulder region 08/15/2006  . Pain in limb 08/26/2005  . Palpitations 09/29/2009  . PAOD (peripheral arterial occlusive disease) ([place]) 11/18/2014   Right leg; diminished popliteal, dorsalis pedis, and posterior tibial artery pulsations   . Postmenopausal atrophic vaginitis 12/14/2004  . Scoliosis   . Senile osteoporosis 12/14/2004  . Tension headache 10/04/2005  . Unspecified cataract 11/26/2004  .  Unspecified constipation 01/21/2005  . Unspecified urinary incontinence 11/26/2004  . Vaginal stenosis 11/18/2014   Tight band about 1/2 inches into the vagina which will not allow passage of my index finger.     Family History  Problem Relation Age of Onset  . Heart disease Mother   . Stroke Father   . Heart disease Brother   . Esophageal cancer Neg Hx   . Stomach cancer Neg Hx   . Colon cancer Neg Hx     Past Surgical History:  Procedure Laterality Date  . ABDOMINAL HYSTERECTOMY  1969   Dr.Braun   . bilateral eyelid surgery  2005   Dr.Scott  . BILATERAL OOPHORECTOMY  2002  . BREAST SURGERY     benign tumor removal, left breast 1969  . CATARACT EXTRACTION     Right  . CHOLECYSTECTOMY  1990   Dr.Moore   . COLONOSCOPY  3/18/20008   inflammation, diverticular associated colitis -Dr. Ardis Hughs  . COSMETIC SURGERY    . ESOPHAGOGASTRODUODENOSCOPY N/A 01/24/2014   Procedure:  ESOPHAGOGASTRODUODENOSCOPY (EGD);  Surgeon: Irene Shipper, MD;  [facility] ENDOSCOPY;  Service: Endoscopy;  Laterality: N/A;  . ESOPHAGOGASTRODUODENOSCOPY ENDOSCOPY  04/04/14   Dr. Ardis Hughs  . EYE SURGERY    . FOREIGN BODY REMOVAL N/A 01/24/2014   Procedure: FOREIGN BODY REMOVAL;  Surgeon: Irene Shipper, MD;  Location: [facility];  Service: Endoscopy;  Laterality: N/A;  . HIP PINNING,CANNULATED  11/07/2011   Procedure: CANNULATED HIP PINNING;  Surgeon: Jessy Oto, MD;  Location: [facility];  Service: Orthopedics;  Laterality: Left;  . ORIF FEMORAL NECK FRACTURE W/ [facility]  11/07/2011   Dr.Damian Buckles   . TONSILLECTOMY     Dr.Joe Tamala Julian  . TUBAL LIGATION    . VAGINAL PROLAPSE REPAIR  2002   Dr.Horback    Social History   Occupational History  . retired Producer, television/film/video Retired   Social History Main Topics  . Smoking status: Never Smoker  . Smokeless tobacco: Never Used  . Alcohol use 4.2 oz/week    7 Glasses of wine per week     Comment: one glass of wine in evening  . Drug use: No  . Sexual activity: No

## 2016-12-19 NOTE — Patient Instructions (Signed)
Avoid frequent bending and stooping  No lifting greater than 10 lbs. May use ice or moist heat for pain. Weight loss is of benefit. Handicap license is approved.Try sholder stretching and strengthening exercises. Tylenol arthritis strength one up to TID. Continue with exercises, have the bone density test done tomorrow.  Continue with boniva for now, will probably need to change medications in the next 1-2 year due to the risk of  Fracture on the subtrochanteric area of the femur.

## 2016-12-20 DIAGNOSIS — Z1231 Encounter for screening mammogram for malignant neoplasm of breast: Secondary | ICD-10-CM | POA: Diagnosis not present

## 2016-12-20 DIAGNOSIS — M810 Age-related osteoporosis without current pathological fracture: Secondary | ICD-10-CM | POA: Diagnosis not present

## 2016-12-20 LAB — HM MAMMOGRAPHY

## 2016-12-20 LAB — HM DEXA SCAN

## 2016-12-26 ENCOUNTER — Other Ambulatory Visit: Payer: Self-pay | Admitting: *Deleted

## 2016-12-26 ENCOUNTER — Telehealth (INDEPENDENT_AMBULATORY_CARE_PROVIDER_SITE_OTHER): Payer: Self-pay | Admitting: Specialist

## 2016-12-26 DIAGNOSIS — E785 Hyperlipidemia, unspecified: Secondary | ICD-10-CM

## 2016-12-26 DIAGNOSIS — I10 Essential (primary) hypertension: Secondary | ICD-10-CM

## 2016-12-26 DIAGNOSIS — R21 Rash and other nonspecific skin eruption: Secondary | ICD-10-CM

## 2016-12-26 NOTE — Telephone Encounter (Signed)
Patient called needing a call back concerning her bone density  test. The number to contact patient is [phone]

## 2016-12-28 NOTE — Telephone Encounter (Signed)
Patient called needing a call back concerning her bone density  test. The number to contact patient is [phone].  Patient states that she is out of her brevia and she doesn't want to fill it if she doesn't need it.

## 2017-01-09 DIAGNOSIS — F333 Major depressive disorder, recurrent, severe with psychotic symptoms: Secondary | ICD-10-CM | POA: Diagnosis not present

## 2017-02-20 ENCOUNTER — Encounter: Payer: Self-pay | Admitting: Internal Medicine

## 2017-02-20 ENCOUNTER — Other Ambulatory Visit: Payer: Self-pay | Admitting: *Deleted

## 2017-02-20 ENCOUNTER — Non-Acute Institutional Stay: Payer: Medicare Other | Admitting: Internal Medicine

## 2017-02-20 VITALS — BP 140/78 | HR 90 | Temp 98.3°F | Resp 16 | Ht 63.0 in | Wt 154.0 lb

## 2017-02-20 DIAGNOSIS — M5430 Sciatica, unspecified side: Secondary | ICD-10-CM | POA: Diagnosis not present

## 2017-02-20 DIAGNOSIS — R21 Rash and other nonspecific skin eruption: Secondary | ICD-10-CM | POA: Diagnosis not present

## 2017-02-20 DIAGNOSIS — M150 Primary generalized (osteo)arthritis: Principal | ICD-10-CM

## 2017-02-20 DIAGNOSIS — M159 Polyosteoarthritis, unspecified: Secondary | ICD-10-CM

## 2017-02-20 DIAGNOSIS — I10 Essential (primary) hypertension: Secondary | ICD-10-CM | POA: Diagnosis not present

## 2017-02-20 DIAGNOSIS — M549 Dorsalgia, unspecified: Secondary | ICD-10-CM | POA: Diagnosis not present

## 2017-02-20 DIAGNOSIS — E785 Hyperlipidemia, unspecified: Secondary | ICD-10-CM | POA: Diagnosis not present

## 2017-02-20 LAB — CBC WITH DIFFERENTIAL/PLATELET
Basophils Absolute: 32 cells/uL (ref 0–200)
Basophils Relative: 0.5 %
EOS PCT: 2.2 %
Eosinophils Absolute: 141 cells/uL (ref 15–500)
HCT: 40.1 % (ref 35.0–45.0)
Hemoglobin: 13.3 g/dL (ref 11.7–15.5)
LYMPHS ABS: 1363 {cells}/uL (ref 850–3900)
MCH: 29.5 pg (ref 27.0–33.0)
MCHC: 33.2 g/dL (ref 32.0–36.0)
MCV: 88.9 fL (ref 80.0–100.0)
MONOS PCT: 10.3 %
MPV: 9.8 fL (ref 7.5–12.5)
NEUTROS ABS: 4205 {cells}/uL (ref 1500–7800)
NEUTROS PCT: 65.7 %
PLATELETS: 275 10*3/uL (ref 140–400)
RBC: 4.51 10*6/uL (ref 3.80–5.10)
RDW: 11.7 % (ref 11.0–15.0)
TOTAL LYMPHOCYTE: 21.3 %
WBC: 6.4 10*3/uL (ref 3.8–10.8)
WBCMIX: 659 {cells}/uL (ref 200–950)

## 2017-02-20 LAB — COMPREHENSIVE METABOLIC PANEL
AG Ratio: 1.7 (calc) (ref 1.0–2.5)
ALBUMIN MSPROF: 4.3 g/dL (ref 3.6–5.1)
ALKALINE PHOSPHATASE (APISO): 58 U/L (ref 33–130)
ALT: 13 U/L (ref 6–29)
AST: 19 U/L (ref 10–35)
BUN/Creatinine Ratio: 16 (calc) (ref 6–22)
BUN: 15 mg/dL (ref 7–25)
CO2: 27 mmol/L (ref 20–32)
CREATININE: 0.94 mg/dL — AB (ref 0.60–0.88)
Calcium: 10 mg/dL (ref 8.6–10.4)
Chloride: 103 mmol/L (ref 98–110)
Globulin: 2.6 g/dL (calc) (ref 1.9–3.7)
Glucose, Bld: 106 mg/dL — ABNORMAL HIGH (ref 65–99)
Potassium: 4.3 mmol/L (ref 3.5–5.3)
Sodium: 140 mmol/L (ref 135–146)
Total Bilirubin: 0.4 mg/dL (ref 0.2–1.2)
Total Protein: 6.9 g/dL (ref 6.1–8.1)

## 2017-02-20 LAB — LIPID PANEL
CHOL/HDL RATIO: 2.8 (calc) (ref <5.0)
Cholesterol: 174 mg/dL (ref <200)
HDL: 63 mg/dL (ref >50)
LDL Cholesterol (Calc): 89 mg/dL (calc)
NON-HDL CHOLESTEROL (CALC): 111 mg/dL (ref <130)
Triglycerides: 123 mg/dL (ref <150)

## 2017-02-20 MED ORDER — ACETAMINOPHEN 500 MG PO TABS: 1000.0000 mg | ORAL_TABLET | Freq: Three times a day (TID) | ORAL | 0 refills | Status: DC | PRN

## 2017-02-20 MED ORDER — PRAVASTATIN SODIUM 20 MG PO TABS: ORAL_TABLET | ORAL | 1 refills | Status: DC

## 2017-02-20 MED ORDER — MELOXICAM 7.5 MG PO TABS: ORAL_TABLET | ORAL | 1 refills | Status: DC

## 2017-02-20 NOTE — Telephone Encounter (Signed)
Patient requested refill

## 2017-02-20 NOTE — Progress Notes (Signed)
[facility]  Provider: Blanchie Serve MD   Location:  [place] of Service:  Clinic (12)  PCP: Blanchie Serve, MD Patient Care Team: Blanchie Serve, MD as PCP - General (Internal Medicine) Nord, [facility] Clent Jacks, MD as Consulting Physician (Ophthalmology) Milus Banister, MD as Consulting Physician (Gastroenterology) Norma Fredrickson, MD as Consulting Physician (Psychiatry) Jessy Oto, MD as Consulting Physician (Orthopedic Surgery) Bjorn Loser, MD as Consulting Physician (Urology)  Extended Emergency Contact Information Primary Emergency Contact: [address] [place], [place] [address] Johnnette Litter of [place] Phone: [phone] Mobile Phone: [phone] Relation: Son   Goals of Care: Advanced Directive information Advanced Directives 10/25/2016  Does Patient Have a Medical Advance Directive? Yes  Type of Paramedic of [place];Living will;Out of facility DNR (pink MOST or yellow form)  Does patient want to make changes to medical advance directive? -  Copy of [place] in Chart? Yes  Pre-existing out of facility DNR order (yellow form or pink MOST form) Pink MOST form placed in chart (order not valid for inpatient use)      Chief Complaint  Patient presents with  . Acute Visit    right buttock pain x 2 weeks    HPI: Patient is a 83 y.o. female seen today for acute visit. She has been having pain to her right buttock radiating to her posterior thigh x 2 weeks. This is limiting her mobility and she is now using walker to ambulate. No known history of back surgery. Has history of DDD, scoliosis and spondylosis. Took tylenol extra strength 500 mg 2 tab tid x 1 and half day days with minimal relief. Denies any new bowel or bladder incontinence. Has history of urinary incontinence. Has been using ice pack and heating pad with more relief from ice pack.   Past  Medical History:  Diagnosis Date  . Abnormal mammogram, unspecified 04/19/2011  . Allergy   . Anxiety   . Backache, unspecified 01/21/2005  . Cervicalgia 08/20/2013  . Chronic kidney disease, stage II (mild) 04/03/2009  . Chronic rhinitis 09/28/2010  . DDD (degenerative disc disease)   . Depression   . Dermatophytosis of nail 01/21/2005  . Diverticulosis of colon (without mention of hemorrhage) 06/17/2005  . Edema 11/26/2004  . GERD (gastroesophageal reflux disease)   . Hiatal hernia   . High cholesterol   . Hyperglycemia 11/18/2014  . Hypertension   . Insomnia, unspecified 10/18/2011  . Left inguinal hernia 04/24/2014  . Nocturia 09/19/2008  . Osteoporosis, senile 11/18/2014  . Other abnormal blood chemistry 04/03/2007  . Pain in joint, pelvic region and thigh 11/12/2011  . Pain in joint, shoulder region 08/15/2006  . Pain in limb 08/26/2005  . Palpitations 09/29/2009  . PAOD (peripheral arterial occlusive disease) ([place]) 11/18/2014   Right leg; diminished popliteal, dorsalis pedis, and posterior tibial artery pulsations   . Postmenopausal atrophic vaginitis 12/14/2004  . Scoliosis   . Senile osteoporosis 12/14/2004  . Tension headache 10/04/2005  . Unspecified cataract 11/26/2004  . Unspecified constipation 01/21/2005  . Unspecified urinary incontinence 11/26/2004  . Vaginal stenosis 11/18/2014   Tight band about 1/2 inches into the vagina which will not allow passage of my index finger.    Past Surgical History:  Procedure Laterality Date  . ABDOMINAL HYSTERECTOMY  1969   Dr.Braun   . bilateral eyelid surgery  2005   Dr.Scott  .  BILATERAL OOPHORECTOMY  2002  . BREAST SURGERY     benign tumor removal, left breast 1969  . CATARACT EXTRACTION     Right  . CHOLECYSTECTOMY  1990   Dr.Moore   . COLONOSCOPY  3/18/20008   inflammation, diverticular associated colitis -Dr. Ardis Hughs  . COSMETIC SURGERY    . ESOPHAGOGASTRODUODENOSCOPY N/A 01/24/2014   Procedure: ESOPHAGOGASTRODUODENOSCOPY (EGD);  Surgeon:  Irene Shipper, MD;  Location: [facility] ENDOSCOPY;  Service: Endoscopy;  Laterality: N/A;  . ESOPHAGOGASTRODUODENOSCOPY ENDOSCOPY  04/04/14   Dr. Ardis Hughs  . EYE SURGERY    . FOREIGN BODY REMOVAL N/A 01/24/2014   Procedure: FOREIGN BODY REMOVAL;  Surgeon: Irene Shipper, MD;  Location: [place] [facility];  Service: Endoscopy;  Laterality: N/A;  . HIP PINNING,CANNULATED  11/07/2011   Procedure: CANNULATED HIP PINNING;  Surgeon: Jessy Oto, MD;  Location: [facility];  Service: Orthopedics;  Laterality: Left;  . ORIF FEMORAL NECK FRACTURE W/ [facility]  11/07/2011   Dr.Nitka   . TONSILLECTOMY     Dr.Joe Tamala Julian  . TUBAL LIGATION    . VAGINAL PROLAPSE REPAIR  2002   Dr.Horback     reports that she has never smoked. She has never used smokeless tobacco. She reports that she drinks about 4.2 oz of alcohol per week . She reports that she does not use drugs. Social History   Social History  . Marital status: Widowed    Spouse name: N/A  . Number of children: N/A  . Years of education: N/A   Occupational History  . retired Producer, television/film/video Retired   Social History Main Topics  . Smoking status: Never Smoker  . Smokeless tobacco: Never Used  . Alcohol use 4.2 oz/week    7 Glasses of wine per week     Comment: one glass of wine in evening  . Drug use: No  . Sexual activity: No   Other Topics Concern  . Not on file   Social History Narrative   Lives at [facility] since 04/14/2005   Widowed (husband expired 2014)   Has Living Will, POA, MOST   Exercise: water aerobic  5 times a week   Walks with walker   Never smoked   Drinks moderate amount of caffeinate beverages daily   Alcohol none           Family History  Problem Relation Age of Onset  . Heart disease Mother   . Stroke Father   . Heart disease Brother   . Esophageal cancer Neg Hx   . Stomach cancer Neg Hx   . Colon cancer Neg Hx     Health Maintenance  Topic Date Due  . INFLUENZA VACCINE  02/22/2017 (Originally 12/14/2016)  .  TETANUS/TDAP  05/16/2018 (Originally 05/16/2014)  . MAMMOGRAM  12/20/2017  . DEXA SCAN  Completed  . PNA vac Low Risk Adult  Completed    Allergies  Allergen Reactions  . Penicillins Other (See Comments)    CHILDHOOD REACTION  . Abilify [Aripiprazole] Rash    Outpatient Encounter Prescriptions as of 02/20/2017  Medication Sig  . antiseptic oral rinse (BIOTENE) LIQD 15 mLs by Mouth Rinse route as needed for dry mouth.   Marland Kitchen aspirin 81 MG tablet Take 81 mg by mouth daily.  Marland Kitchen buPROPion (WELLBUTRIN XL) 150 MG 24 hr tablet Take 450 mg by mouth daily.  . Calcium Carb-Cholecalciferol (CALTRATE 600+D) 600-800 MG-UNIT TABS Take 1 tablet by mouth 2 (two) times daily.  . chlorpheniramine-HYDROcodone [facility])  10-8 MG/5ML SUER Take 5 mLs by mouth as needed for cough.  . cholecalciferol (VITAMIN D) 1000 UNITS tablet Take 1,000 Units by mouth daily.  . clonazePAM (KLONOPIN) 0.5 MG tablet Take 0.5 mg by mouth. Take one half  tablet in morning one tablet in evening as needed for anxiety  . Glucosamine HCl 1000 MG TABS Take 2,000 mg by mouth 2 (two) times daily.  . meloxicam (MOBIC) 7.5 MG tablet TAKE 1 TABLET BY MOUTH EVERY DAY AS NEEDED FOR ARTHRITIS  . Menthol, Topical Analgesic, (BIOFREEZE EX) Apply topically. Apply topically four times daily  . Multiple Vitamin (MULTIVITAMIN WITH MINERALS) TABS Take 1 tablet by mouth daily.  Marland Kitchen NIFEdipine (PROCARDIA XL/ADALAT-CC) 60 MG 24 hr tablet TAKE 1 TABLET BY MOUTH EVERY DAY TO CONTROL BLOOD PRESSURE  . pantoprazole (PROTONIX) 40 MG tablet One daily to reduce stomach acid  . Polyethyl Glycol-Propyl Glycol (SYSTANE) 0.4-0.3 % SOLN Place 1 drop into both eyes 2 (two) times daily.   . pravastatin (PRAVACHOL) 20 MG tablet TAKE 1 TABLET BY MOUTH EVERY DAY TO LOWER CHOLESTEROL  . venlafaxine (EFFEXOR) 75 MG tablet Take 225 mg by mouth daily.   Marland Kitchen acetaminophen (TYLENOL) 500 MG tablet Take 2 tablets (1,000 mg total) by mouth every 8 (eight) hours as  needed.  . ibandronate (BONIVA) 150 MG tablet Take 1 tablet (150 mg total) by mouth every 30 (thirty) days. Take in the morning with a full glass of water, on an empty stomach, and do not take anything else by mouth or lie down for the next 30 min. (Patient not taking: Reported on 02/20/2017)  . [DISCONTINUED] chlorpheniramine-HYDROcodone (TUSSIONEX PENNKINETIC ER) 10-8 MG/5ML LQCR Take one tsp every 12 hours if needed to control  cough  . [DISCONTINUED] predniSONE (DELTASONE) 10 MG tablet START ON 10/15/16. Take 4 tabs po daily x 3 days then 3 tabs daily x 3 days then 2 tabs daily x 3 days then 1 tab daily x 3 days and stop (Patient not taking: Reported on 12/19/2016)   No facility-administered encounter medications on file as of 02/20/2017.     Review of Systems  Constitutional: Negative for fever.  Gastrointestinal:       No bowel incontinence  Genitourinary:       No new or worsening bladder incontinence  Musculoskeletal: Positive for back pain and gait problem.       No fall or trauma  Neurological: Negative for dizziness and numbness.  Psychiatric/Behavioral: Negative for behavioral problems.    Vitals:   02/20/17 1457  BP: 140/78  Pulse: 90  Resp: 16  Temp: 98.3 F (36.8 C)  TempSrc: Oral  SpO2: 95%  Weight: 154 lb (69.9 kg)  Height: 5\' 3"  (1.6 m)   Body mass index is 27.28 kg/m. Physical Exam  Constitutional: She appears well-developed and well-nourished. No distress.  HENT:  Head: Normocephalic and atraumatic.  Cardiovascular: Normal rate and regular rhythm.   Pulmonary/Chest: Effort normal and breath sounds normal. She has no wheezes.  Abdominal: Soft.  Musculoskeletal:  Unsteady gait. Using rolling walker with seat and brake. Can move all 4 extremities, scoliosis present, arthritis changes present, no spinal or paraspinal tenderness  Lymphadenopathy:    She has no cervical adenopathy.  Neurological: She is alert.  Skin: Skin is warm and dry. She is not diaphoretic.    Psychiatric: She has a normal mood and affect.    Labs reviewed: Basic Metabolic Panel:  Recent Labs  10/14/16 1337 10/17/16 1512 02/20/17 0750  NA 130* 137 140  K 4.0 4.5 4.3  CL 95* 100 103  CO2 26 25 27   GLUCOSE 106* 172* 106*  BUN 21 13 15   CREATININE 0.98* 0.78 0.94*  CALCIUM 8.7 9.6 10.0   Liver Function Tests:  Recent Labs  05/23/16 0800 10/14/16 1337 10/17/16 1512 02/20/17 0750  AST 25 38* 25 19  ALT 17 48* 37* 13  ALKPHOS 59 72 61  --   BILITOT 0.4 0.5 0.2 0.4  PROT 7.1 5.6* 6.2 6.9  ALBUMIN 4.2 3.3* 3.6  --    No results for input(s): LIPASE, AMYLASE in the last 8760 hours. No results for input(s): AMMONIA in the last 8760 hours. CBC:  Recent Labs  10/14/16 1337 10/17/16 1512 02/20/17 0750  WBC 19.0* 8.8 6.4  NEUTROABS  --  7,480 4,205  HGB 11.0* 11.8 13.3  HCT 33.7* 36.1 40.1  MCV 91.8 92.3 88.9  PLT 314 426* 275   Cardiac Enzymes: No results for input(s): CKTOTAL, CKMB, CKMBINDEX, TROPONINI in the last 8760 hours. BNP: Invalid input(s): POCBNP No results found for: HGBA1C Lab Results  Component Value Date   TSH 2.90 05/21/2015   No results found for: VITAMINB12 No results found for: FOLATE No results found for: IRON, TIBC, FERRITIN  Lipid Panel:  Recent Labs  05/23/16 0800 02/20/17 0750  CHOL 183 174  HDL 67 63  LDLCALC 90  --   TRIG 128 123  CHOLHDL 2.7 2.8   No results found for: HGBA1C  Procedures since last visit: No results found.  Assessment/Plan  1. Back pain with sciatica Reviewed note from dr green and dr Louanne Skye, has history of chronic low back pain with sciatica. No signs of cord compression. Patient not taking tylenol as prescribed. Tylenol extra strength 500 mg 2 tab tid x 2 weeks. Patient to use ice pack tid as well. She has a visit next week for her routine visit. Reassess next visit if symptoms dont improve. Possibly consider gabapentin.     Labs/tests ordered:  none  Next appointment: 1  week  Communication: reviewed care plan with patient    Blanchie Serve, MD Internal Medicine [place], [place] [address] Cell Phone (Monday-Friday 8 am - 5 pm): [phone] On Call: [phone] and follow prompts after 5 pm and on weekends Office Phone: [phone] Office Fax: [phone]

## 2017-02-21 NOTE — Telephone Encounter (Signed)
I found the report in the media tab of the chart and it looks like on that Dr. Bubba Camp ordered the test, so I advised that she call their office and get the results from them.

## 2017-02-21 NOTE — Telephone Encounter (Signed)
Patient called wanting to know the results of her bone density test? If she could get a call back as soon as possible. Thank you. CB # [phone]

## 2017-02-23 ENCOUNTER — Ambulatory Visit (INDEPENDENT_AMBULATORY_CARE_PROVIDER_SITE_OTHER): Payer: Medicare Other | Admitting: Specialist

## 2017-02-23 ENCOUNTER — Encounter (INDEPENDENT_AMBULATORY_CARE_PROVIDER_SITE_OTHER): Payer: Self-pay | Admitting: Specialist

## 2017-02-23 VITALS — BP 153/85 | HR 90 | Ht 63.0 in | Wt 154.0 lb

## 2017-02-23 DIAGNOSIS — M48062 Spinal stenosis, lumbar region with neurogenic claudication: Secondary | ICD-10-CM

## 2017-02-23 DIAGNOSIS — M5441 Lumbago with sciatica, right side: Secondary | ICD-10-CM | POA: Diagnosis not present

## 2017-02-23 MED ORDER — METHYLPREDNISOLONE 4 MG PO TBPK: ORAL_TABLET | ORAL | 0 refills | Status: DC

## 2017-02-23 NOTE — Addendum Note (Signed)
Addended by: Basil Dess on: 02/23/2017 11:26 AM   Modules accepted: Orders

## 2017-02-23 NOTE — Progress Notes (Addendum)
Office Visit Note   Patient: Tina Hampton           Date of Birth: [birth year]           MRN: [id number] Visit Date: 02/23/2017              Requested by: Blanchie Serve, MD [address] [place], [place] [address] PCP: Blanchie Serve, MD   Assessment & Plan: Visit Diagnoses:  1. Spinal stenosis of lumbar region with neurogenic claudication   2. Acute right-sided low back pain with right-sided sciatica     Plan:Avoid bending, stooping and avoid lifting weights greater than 10 lbs. Avoid prolong standing and walking. Avoid frequent bending and stooping  No lifting greater than 10 lbs. May use ice or moist heat for pain. Weight loss is of benefit. Handicap license is approved. Dr. Romona Curls secretary/Assistant will call to arrange for epidural steroid injection   Follow-Up Instructions: Return in about 4 weeks (around 03/23/2017).   Orders:  Orders Placed This Encounter  Procedures  . Ambulatory referral to Physical Medicine Rehab   Meds ordered this encounter  Medications  . DISCONTD: methylPREDNISolone (MEDROL DOSEPAK) 4 MG TBPK tablet    Sig: Avoid bending, stooping and avoid lifting weights greater than 10 lbs. Avoid prolong standing and walking. Avoid frequent bending and stooping  No lifting greater than 10 lbs. May use ice or moist heat for pain. Weight loss is of benefit. Handicap license is approved. Dr. Romona Curls secretary/Assistant will call to arrange for epidural steroid injection one-two weeks, if the [facility] work then she may cancel the ESI.    Dispense:  21 tablet    Refill:  0      Procedures: No procedures performed   Clinical Data: Findings:  Abdomenal CT from 2015 shows collapsing degenerative scoliosis of the lumbar spine with right curve apex at the L2-3, right lateral subluxation of the L4-5 level to the right side. Right subarticular  Lateral recess stenosis stenosis right L5-S1, central and bilateral lateral recess stenosis L4-5. Vacuum  sign.    Subjective: Chief Complaint  Patient presents with  . Right Hip - Pain    83 year old female presents with complaints of right buttock pain that is worsened by standing and walking. She uses a walker with a seat and brakes with a bell. Bowel and bladder are okay, some diarrhea. Pain is sharp when it happens and she sometimes feels like she may fall. History of osteoporosis and previous T-L fracture.     Review of Systems  Constitutional: Negative.   HENT: Negative.   Eyes: Negative.   Respiratory: Negative.   Cardiovascular: Negative.   Gastrointestinal: Negative.   Endocrine: Negative.   Genitourinary: Negative.   Musculoskeletal: Negative.   Skin: Negative.   Allergic/Immunologic: Negative.   Neurological: Negative.   Hematological: Negative.   Psychiatric/Behavioral: Negative.      Objective: Vital Signs: BP (!) 153/85 (BP Location: Left Arm, Patient Position: Sitting)   Pulse 90   Ht 5\' 3"  (1.6 m)   Wt 154 lb (69.9 kg)   BMI 27.28 kg/m   Physical Exam  Constitutional: She is oriented to person, place, and time. She appears well-developed and well-nourished.  HENT:  Head: Normocephalic and atraumatic.  Eyes: Pupils are equal, round, and reactive to light. EOM are normal.  Neck: Normal range of motion. Neck supple.  Pulmonary/Chest: Effort normal and breath sounds normal.  Abdominal: Soft. Bowel sounds are normal.  Neurological: She is alert and oriented  to person, place, and time.  Skin: Skin is warm and dry.  Psychiatric: She has a normal mood and affect. Her behavior is normal. Judgment and thought content normal.    Back Exam   Tenderness  The patient is experiencing tenderness in the lumbar.  Range of Motion  Extension:  10 abnormal  Flexion: normal  Lateral Bend Right: abnormal  Lateral Bend Left: abnormal  Rotation Right: abnormal  Rotation Left: abnormal   Muscle Strength  Right Quadriceps:  5/5  Left Quadriceps:  5/5  Right  Hamstrings:  5/5  Left Hamstrings:  5/5   Tests  Straight leg raise right: negative Straight leg raise left: negative  Reflexes  Patellar: 0/4 Achilles: 0/4  Other  Toe Walk: normal Heel Walk: normal Sensation: normal Gait: abnormal       Specialty Comments:  No specialty comments available.  Imaging: No results found.   PMFS History: Patient Active Problem List   Diagnosis Date Noted  . Rash 10/25/2016  . Hearing loss 10/25/2016  . Chronic bilateral low back pain without sciatica 08/17/2016  . Pain in joint, shoulder region 05/31/2016  . Sprain of anterior talofibular ligament 04/27/2016  . Pain of left scapula 10/20/2015  . Tremor 06/02/2015  . Vaginal stenosis 11/18/2014  . PAOD (peripheral arterial occlusive disease) ([place]) 11/18/2014  . Osteoporosis, senile 11/18/2014  . Hyperglycemia 11/18/2014  . Left inguinal hernia 04/24/2014  . Diverticulosis of colon with hemorrhage 04/24/2014  . Aortic ejection murmur 04/24/2014  . Dysphagia 01/24/2014  . Esophageal stricture 01/24/2014  . Hip fracture ([place]) 11/07/2011  . GERD (gastroesophageal reflux disease)   . Hypertension   . High cholesterol   . Hiatal hernia   . Scoliosis   . DDD (degenerative disc disease)   . Depression   . Senile osteoporosis 12/14/2004  . Postmenopausal atrophic vaginitis 12/14/2004  . Edema 11/26/2004  . Urinary incontinence 11/26/2004   Past Medical History:  Diagnosis Date  . Abnormal mammogram, unspecified 04/19/2011  . Allergy   . Anxiety   . Backache, unspecified 01/21/2005  . Cervicalgia 08/20/2013  . Chronic kidney disease, stage II (mild) 04/03/2009  . Chronic rhinitis 09/28/2010  . DDD (degenerative disc disease)   . Depression   . Dermatophytosis of nail 01/21/2005  . Diverticulosis of colon (without mention of hemorrhage) 06/17/2005  . Edema 11/26/2004  . GERD (gastroesophageal reflux disease)   . Hiatal hernia   . High cholesterol   . Hyperglycemia 11/18/2014  .  Hypertension   . Insomnia, unspecified 10/18/2011  . Left inguinal hernia 04/24/2014  . Nocturia 09/19/2008  . Osteoporosis, senile 11/18/2014  . Other abnormal blood chemistry 04/03/2007  . Pain in joint, pelvic region and thigh 11/12/2011  . Pain in joint, shoulder region 08/15/2006  . Pain in limb 08/26/2005  . Palpitations 09/29/2009  . PAOD (peripheral arterial occlusive disease) ([place]) 11/18/2014   Right leg; diminished popliteal, dorsalis pedis, and posterior tibial artery pulsations   . Postmenopausal atrophic vaginitis 12/14/2004  . Scoliosis   . Senile osteoporosis 12/14/2004  . Tension headache 10/04/2005  . Unspecified cataract 11/26/2004  . Unspecified constipation 01/21/2005  . Unspecified urinary incontinence 11/26/2004  . Vaginal stenosis 11/18/2014   Tight band about 1/2 inches into the vagina which will not allow passage of my index finger.     Family History  Problem Relation Age of Onset  . Heart disease Mother   . Stroke Father   . Heart disease Brother   . Esophageal cancer  Neg Hx   . Stomach cancer Neg Hx   . Colon cancer Neg Hx     Past Surgical History:  Procedure Laterality Date  . ABDOMINAL HYSTERECTOMY  1969   Dr.Braun   . bilateral eyelid surgery  2005   Dr.Scott  . BILATERAL OOPHORECTOMY  2002  . BREAST SURGERY     benign tumor removal, left breast 1969  . CATARACT EXTRACTION     Right  . CHOLECYSTECTOMY  1990   Dr.Moore   . COLONOSCOPY  3/18/20008   inflammation, diverticular associated colitis -Dr. Ardis Hughs  . COSMETIC SURGERY    . ESOPHAGOGASTRODUODENOSCOPY N/A 01/24/2014   Procedure: ESOPHAGOGASTRODUODENOSCOPY (EGD);  Surgeon: Irene Shipper, MD;  Location[facility];  Service: Endoscopy;  Laterality: N/A;  . ESOPHAGOGASTRODUODENOSCOPY ENDOSCOPY  04/04/14   Dr. Ardis Hughs  . EYE SURGERY    . FOREIGN BODY REMOVAL N/A 01/24/2014   Procedure: FOREIGN BODY REMOVAL;  Surgeon: Irene Shipper, MD;  Location: [facility];  Service: Endoscopy;  Laterality: N/A;  . HIP  PINNING,CANNULATED  11/07/2011   Procedure: CANNULATED HIP PINNING;  Surgeon: Jessy Oto, MD;  Location: [facility];  Service: Orthopedics;  Laterality: Left;  . ORIF FEMORAL NECK FRACTURE W/ [facility]  11/07/2011   Dr.Nitka   . TONSILLECTOMY     Dr.Joe Tamala Julian  . TUBAL LIGATION    . VAGINAL PROLAPSE REPAIR  2002   Dr.Horback    Social History   Occupational History  . retired Producer, television/film/video Retired   Social History Main Topics  . Smoking status: Never Smoker  . Smokeless tobacco: Never Used  . Alcohol use 4.2 oz/week    7 Glasses of wine per week     Comment: one glass of wine in evening  . Drug use: No  . Sexual activity: No

## 2017-02-23 NOTE — Patient Instructions (Signed)
  Plan:Avoid bending, stooping and avoid lifting weights greater than 10 lbs. Avoid prolong standing and walking. Avoid frequent bending and stooping  No lifting greater than 10 lbs. May use ice or moist heat for pain. Weight loss is of benefit. Handicap license is approved. Dr. Romona Curls secretary/Assistant will call to arrange for epidural steroid injection  Take a steroid dose pak, stop meloxicam.

## 2017-02-27 ENCOUNTER — Telehealth (INDEPENDENT_AMBULATORY_CARE_PROVIDER_SITE_OTHER): Payer: Self-pay | Admitting: Specialist

## 2017-02-27 DIAGNOSIS — F411 Generalized anxiety disorder: Secondary | ICD-10-CM | POA: Diagnosis not present

## 2017-02-27 NOTE — Telephone Encounter (Signed)
Patient called asking for a refill on pain medication, she is currently taking anti depressants and she wants to take something that doesn't interact with it. Just to hold her over til her appointment with Dr. Ernestina Patches on the 25th. Please advise.

## 2017-02-27 NOTE — Telephone Encounter (Signed)
Patient called asking for a refill on pain medication, she is currently taking anti depressants and she wants to take something that doesn't interact with it. Just to hold her over til her appointment with Dr. Ernestina Patches on the 25th. CB # [phone]

## 2017-02-28 ENCOUNTER — Telehealth (INDEPENDENT_AMBULATORY_CARE_PROVIDER_SITE_OTHER): Payer: Self-pay | Admitting: Specialist

## 2017-02-28 ENCOUNTER — Other Ambulatory Visit (INDEPENDENT_AMBULATORY_CARE_PROVIDER_SITE_OTHER): Payer: Self-pay | Admitting: Specialist

## 2017-02-28 ENCOUNTER — Encounter: Payer: Self-pay | Admitting: Internal Medicine

## 2017-02-28 MED ORDER — ACETAMINOPHEN-CODEINE #3 300-30 MG PO TABS: 1.0000 | ORAL_TABLET | Freq: Four times a day (QID) | ORAL | 0 refills | Status: DC | PRN

## 2017-02-28 NOTE — Telephone Encounter (Signed)
I will prescribe Tylenol #3 and this can be called to her pharmacy for severe pain. Hydrocodone is too strong and more addictive. Tramadol interacts with her effexor.

## 2017-02-28 NOTE — Telephone Encounter (Signed)
Patient called giving her cell number to call her concerning her pain medicine  See previous message  [phone]

## 2017-02-28 NOTE — Telephone Encounter (Signed)
I called and lmom @ home # advised that rx was called to [facility] in chart

## 2017-03-01 ENCOUNTER — Non-Acute Institutional Stay: Payer: Medicare Other | Admitting: Internal Medicine

## 2017-03-01 ENCOUNTER — Encounter: Payer: Self-pay | Admitting: Internal Medicine

## 2017-03-01 VITALS — BP 138/74 | HR 88 | Temp 98.0°F | Resp 16 | Ht 63.0 in | Wt 150.8 lb

## 2017-03-01 DIAGNOSIS — N3281 Overactive bladder: Secondary | ICD-10-CM | POA: Diagnosis not present

## 2017-03-01 DIAGNOSIS — K219 Gastro-esophageal reflux disease without esophagitis: Secondary | ICD-10-CM | POA: Diagnosis not present

## 2017-03-01 DIAGNOSIS — M150 Primary generalized (osteo)arthritis: Secondary | ICD-10-CM

## 2017-03-01 DIAGNOSIS — M810 Age-related osteoporosis without current pathological fracture: Secondary | ICD-10-CM | POA: Diagnosis not present

## 2017-03-01 DIAGNOSIS — M48062 Spinal stenosis, lumbar region with neurogenic claudication: Secondary | ICD-10-CM | POA: Diagnosis not present

## 2017-03-01 DIAGNOSIS — F339 Major depressive disorder, recurrent, unspecified: Secondary | ICD-10-CM | POA: Diagnosis not present

## 2017-03-01 DIAGNOSIS — E785 Hyperlipidemia, unspecified: Secondary | ICD-10-CM

## 2017-03-01 DIAGNOSIS — I10 Essential (primary) hypertension: Secondary | ICD-10-CM | POA: Diagnosis not present

## 2017-03-01 DIAGNOSIS — M159 Polyosteoarthritis, unspecified: Secondary | ICD-10-CM | POA: Insufficient documentation

## 2017-03-01 MED ORDER — PRAVASTATIN SODIUM 10 MG PO TABS: ORAL_TABLET | ORAL | 3 refills | Status: DC

## 2017-03-01 MED ORDER — IBANDRONATE SODIUM 150 MG PO TABS: 150.0000 mg | ORAL_TABLET | ORAL | 3 refills | Status: DC

## 2017-03-01 NOTE — Patient Instructions (Addendum)
  Please take your flu shot this month.  Your cholesterol pill has been decreased to 10 mg daily from 20 mg daily. Pick up your new prescription please.  Stop taking meloxicam.   Start taking your Boniva once a week. New script sent to your pharmacy.  You have a copy of recent blood work.    I will see you in 6 months.

## 2017-03-01 NOTE — Progress Notes (Signed)
[facility]  Provider: Blanchie Serve MD   Location:  [place] of Service:  Clinic (12)  PCP: Blanchie Serve, MD Patient Care Team: Blanchie Serve, MD as PCP - General (Internal Medicine) [place], [facility] Clent Jacks, MD as Consulting Physician (Ophthalmology) Milus Banister, MD as Consulting Physician (Gastroenterology) Norma Fredrickson, MD as Consulting Physician (Psychiatry) Jessy Oto, MD as Consulting Physician (Orthopedic Surgery) Bjorn Loser, MD as Consulting Physician (Urology)  Extended Emergency Contact Information Primary Emergency Contact: [address] [place], [place] [address] Johnnette Litter of [place] Phone: [phone] Mobile Phone: [phone] Relation: Son   Goals of Care: Advanced Directive information Advanced Directives 10/25/2016  Does Patient Have a Medical Advance Directive? Yes  Type of Paramedic of [place];Living will;Out of facility DNR (pink MOST or yellow form)  Does patient want to make changes to medical advance directive? -  Copy of [place] in Chart? Yes  Pre-existing out of facility DNR order (yellow form or pink MOST form) Pink MOST form placed in chart (order not valid for inpatient use)      Chief Complaint  Patient presents with  . Medical Management of Chronic Issues    4 month follow up. Paitent is concerned about her dexa scan results. Patient stated that she is having pain in her right leg.   . Medication Refill    No refills needed at this time.   Marland Kitchen Results    Discuss labs     HPI: Patient is a 83 y.o. female seen today for routine visit. Has not taken her flu vaccine this season.  Lumbar spinal stenosis with neurogenic claudication- seen by Dr Louanne Skye. Has ongoing pain. Plan is for epidural steroid injection with Dr Ernestina Patches on 03/09/17. Currently on acetaminophen-codeine # 3 1 tab q6h prn pain.  Chronic depression-  follows with psychiatry and has upcoming follow up. Currently on bupropion 450 mg daily and venlafaxine 225 mg daily. Also on low dose clonazepam as needed for anxiety. Feels low.   OA- currently on vitamin D and glucosamine. Not taking meloxicam. Uses walker for mobility. Has DDD, scoliosis and spondylosis.   Hyperlipidemia- currently on pravastatin 20 mg daily.   Hypertension- controlled BP, currently on nifedipine 60 mg daily.   GERD- protonix has been helpful and denies any reflux symptom.   OAB- seen by urology in past. Failed medication in past. Symptomatic management at present. She uses pad and diapers.   Osteoporosis- stopped boniva for unclear reason for last 4 months. Denies any side effect from it. Reviewed dexa scan. T score -2.6.   Past Medical History:  Diagnosis Date  . Abnormal mammogram, unspecified 04/19/2011  . Allergy   . Anxiety   . Backache, unspecified 01/21/2005  . Cervicalgia 08/20/2013  . Chronic kidney disease, stage II (mild) 04/03/2009  . Chronic rhinitis 09/28/2010  . DDD (degenerative disc disease)   . Depression   . Dermatophytosis of nail 01/21/2005  . Diverticulosis of colon (without mention of hemorrhage) 06/17/2005  . Edema 11/26/2004  . GERD (gastroesophageal reflux disease)   . Hiatal hernia   . High cholesterol   . Hyperglycemia 11/18/2014  . Hypertension   . Insomnia, unspecified 10/18/2011  . Left inguinal hernia 04/24/2014  . Nocturia 09/19/2008  . Osteoporosis, senile 11/18/2014  . Other abnormal blood chemistry 04/03/2007  . Pain in joint, pelvic region and thigh 11/12/2011  .  Pain in joint, shoulder region 08/15/2006  . Pain in limb 08/26/2005  . Palpitations 09/29/2009  . PAOD (peripheral arterial occlusive disease) ([place]) 11/18/2014   Right leg; diminished popliteal, dorsalis pedis, and posterior tibial artery pulsations   . Postmenopausal atrophic vaginitis 12/14/2004  . Scoliosis   . Senile osteoporosis 12/14/2004  . Tension headache 10/04/2005  .  Unspecified cataract 11/26/2004  . Unspecified constipation 01/21/2005  . Unspecified urinary incontinence 11/26/2004  . Vaginal stenosis 11/18/2014   Tight band about 1/2 inches into the vagina which will not allow passage of my index finger.    Past Surgical History:  Procedure Laterality Date  . ABDOMINAL HYSTERECTOMY  1969   Dr.Braun   . bilateral eyelid surgery  2005   Dr.Scott  . BILATERAL OOPHORECTOMY  2002  . BREAST SURGERY     benign tumor removal, left breast 1969  . CATARACT EXTRACTION     Right  . CHOLECYSTECTOMY  1990   Dr.Moore   . COLONOSCOPY  3/18/20008   inflammation, diverticular associated colitis -Dr. Ardis Hughs  . COSMETIC SURGERY    . ESOPHAGOGASTRODUODENOSCOPY N/A 01/24/2014   Procedure: ESOPHAGOGASTRODUODENOSCOPY (EGD);  Surgeon: Irene Shipper, MD;  Location: [facility] ENDOSCOPY;  Service: Endoscopy;  Laterality: N/A;  . ESOPHAGOGASTRODUODENOSCOPY ENDOSCOPY  04/04/14   Dr. Ardis Hughs  . EYE SURGERY    . FOREIGN BODY REMOVAL N/A 01/24/2014   Procedure: FOREIGN BODY REMOVAL;  Surgeon: Irene Shipper, MD;  Location: [place];  Service: Endoscopy;  Laterality: N/A;  . HIP PINNING,CANNULATED  11/07/2011   Procedure: CANNULATED HIP PINNING;  Surgeon: Jessy Oto, MD;  Location: [facility];  Service: Orthopedics;  Laterality: Left;  . ORIF FEMORAL NECK FRACTURE W/ [facility]  11/07/2011   Dr.Nitka   . TONSILLECTOMY     Dr.Joe Tamala Julian  . TUBAL LIGATION    . VAGINAL PROLAPSE REPAIR  2002   Dr.Horback     reports that she has never smoked. She has never used smokeless tobacco. She reports that she drinks about 4.2 oz of alcohol per week . She reports that she does not use drugs. Social History   Social History  . Marital status: Widowed    Spouse name: N/A  . Number of children: N/A  . Years of education: N/A   Occupational History  . retired Producer, television/film/video Retired   Social History Main Topics  . Smoking status: Never Smoker  . Smokeless tobacco: Never Used  . Alcohol use 4.2 oz/week      7 Glasses of wine per week     Comment: one glass of wine in evening  . Drug use: No  . Sexual activity: No   Other Topics Concern  . Not on file   Social History Narrative   Lives at [facility] since 04/14/2005   Widowed (husband expired 2014)   Has Living Will, POA, MOST   Exercise: water aerobic  5 times a week   Walks with walker   Never smoked   Drinks moderate amount of caffeinate beverages daily   Alcohol none           Family History  Problem Relation Age of Onset  . Heart disease Mother   . Stroke Father   . Heart disease Brother   . Esophageal cancer Neg Hx   . Stomach cancer Neg Hx   . Colon cancer Neg Hx     Health Maintenance  Topic Date Due  . INFLUENZA VACCINE  12/14/2016  . TETANUS/TDAP  05/16/2018 (Originally 05/16/2014)  . MAMMOGRAM  12/20/2017  . DEXA SCAN  Completed  . PNA vac Low Risk Adult  Completed    Allergies  Allergen Reactions  . Penicillins Other (See Comments)    CHILDHOOD REACTION  . Abilify [Aripiprazole] Rash    Outpatient Encounter Prescriptions as of 03/01/2017  Medication Sig  . acetaminophen-codeine (TYLENOL #3) 300-30 MG tablet Take 1 tablet by mouth every 6 (six) hours as needed for moderate pain.  Marland Kitchen antiseptic oral rinse (BIOTENE) LIQD 15 mLs by Mouth Rinse route as needed for dry mouth.   Marland Kitchen aspirin 81 MG tablet Take 81 mg by mouth daily.  Marland Kitchen buPROPion (WELLBUTRIN XL) 150 MG 24 hr tablet Take 450 mg by mouth daily.  . Calcium Carb-Cholecalciferol (CALTRATE 600+D) 600-800 MG-UNIT TABS Take 1 tablet by mouth 2 (two) times daily.  . chlorpheniramine-HYDROcodone (TUSSIONEX PENNKINETIC ER) 10-8 MG/5ML SUER Take 5 mLs by mouth as needed for cough.  . cholecalciferol (VITAMIN D) 1000 UNITS tablet Take 1,000 Units by mouth daily.  . clonazePAM (KLONOPIN) 0.5 MG tablet Take 0.5 mg by mouth. Take one half  tablet in morning one tablet in evening as needed for anxiety  . Glucosamine HCl 1000 MG TABS Take 2,000 mg by mouth  2 (two) times daily.  . Menthol, Topical Analgesic, (BIOFREEZE EX) Apply 1 application topically as needed.   . Multiple Vitamin (MULTIVITAMIN WITH MINERALS) TABS Take 1 tablet by mouth daily.  Marland Kitchen NIFEdipine (PROCARDIA XL/ADALAT-CC) 60 MG 24 hr tablet TAKE 1 TABLET BY MOUTH EVERY DAY TO CONTROL BLOOD PRESSURE  . pantoprazole (PROTONIX) 40 MG tablet One daily to reduce stomach acid  . Polyethyl Glycol-Propyl Glycol (SYSTANE) 0.4-0.3 % SOLN Place 1 drop into both eyes 2 (two) times daily.   . pravastatin (PRAVACHOL) 20 MG tablet TAKE 1 TABLET BY MOUTH EVERY DAY TO LOWER CHOLESTEROL  . venlafaxine (EFFEXOR) 75 MG tablet Take 225 mg by mouth daily.   Marland Kitchen ibandronate (BONIVA) 150 MG tablet Take 1 tablet (150 mg total) by mouth every 30 (thirty) days. Take in the morning with a full glass of water, on an empty stomach, and do not take anything else by mouth or lie down for the next 30 min. (Patient not taking: Reported on 03/01/2017)  . meloxicam (MOBIC) 7.5 MG tablet TAKE 1 TABLET BY MOUTH EVERY DAY AS NEEDED FOR ARTHRITIS (Patient not taking: Reported on 03/01/2017)  . [DISCONTINUED] acetaminophen (TYLENOL) 500 MG tablet Take 2 tablets (1,000 mg total) by mouth every 8 (eight) hours as needed.  . [DISCONTINUED] methylPREDNISolone (MEDROL DOSEPAK) 4 MG TBPK tablet Take as directed.   No facility-administered encounter medications on file as of 03/01/2017.     Review of Systems  Constitutional: Negative for appetite change, chills and fever.  HENT: Positive for hearing loss. Negative for congestion, mouth sores, nosebleeds and trouble swallowing.   Respiratory: Negative for cough, shortness of breath and wheezing.   Cardiovascular: Negative for chest pain, palpitations and leg swelling.  Gastrointestinal: Negative for abdominal pain, blood in stool, constipation, diarrhea, nausea and vomiting.       No bowel incontinence  Genitourinary: Positive for frequency and urgency. Negative for dysuria, flank  pain and hematuria.       No new or worsening bladder incontinence  Musculoskeletal: Positive for back pain and gait problem.       No fall or trauma  Skin: Negative for rash.  Neurological: Negative for dizziness, numbness and headaches.  Psychiatric/Behavioral: Positive for dysphoric mood.  Negative for behavioral problems. The patient is nervous/anxious.     Vitals:   03/01/17 1051  BP: 138/74  Pulse: 88  Resp: 16  Temp: 98 F (36.7 C)  TempSrc: Oral  SpO2: 98%  Weight: 150 lb 12.8 oz (68.4 kg)  Height: 5' 3"  (1.6 m)   Body mass index is 26.71 kg/m.   Wt Readings from Last 3 Encounters:  03/01/17 150 lb 12.8 oz (68.4 kg)  02/23/17 154 lb (69.9 kg)  02/20/17 154 lb (69.9 kg)   Physical Exam  Constitutional: She appears well-developed and well-nourished. No distress.  HENT:  Head: Normocephalic and atraumatic.  Nose: Nose normal.  Mouth/Throat: Oropharynx is clear and moist. No oropharyngeal exudate.  Eyes: Pupils are equal, round, and reactive to light. Conjunctivae and EOM are normal. Right eye exhibits no discharge. Left eye exhibits no discharge.  Neck: Normal range of motion. Neck supple.  Cardiovascular: Normal rate and regular rhythm.   Pulmonary/Chest: Effort normal and breath sounds normal. No respiratory distress. She has no wheezes. She has no rales.  Abdominal: Soft. Bowel sounds are normal. She exhibits no distension. There is no tenderness. There is no guarding.  Musculoskeletal: She exhibits no edema.  Unsteady gait. Using rolling walker with seat and brake. Can move all 4 extremities, good strength present. scoliosis present, arthritis changes present, lumbar spinal tenderness present.   Lymphadenopathy:    She has no cervical adenopathy.  Neurological: She is alert.  Skin: Skin is warm and dry. No rash noted. She is not diaphoretic.  Psychiatric:  Somewhat flat affect and anxious    Labs reviewed: Basic Metabolic Panel:  Recent Labs   10/14/16 1337 10/17/16 1512 02/20/17 0750  NA 130* 137 140  K 4.0 4.5 4.3  CL 95* 100 103  CO2 26 25 27   GLUCOSE 106* 172* 106*  BUN 21 13 15   CREATININE 0.98* 0.78 0.94*  CALCIUM 8.7 9.6 10.0   Liver Function Tests:  Recent Labs  05/23/16 0800 10/14/16 1337 10/17/16 1512 02/20/17 0750  AST 25 38* 25 19  ALT 17 48* 37* 13  ALKPHOS 59 72 61  --   BILITOT 0.4 0.5 0.2 0.4  PROT 7.1 5.6* 6.2 6.9  ALBUMIN 4.2 3.3* 3.6  --    No results for input(s): LIPASE, AMYLASE in the last 8760 hours. No results for input(s): AMMONIA in the last 8760 hours. CBC:  Recent Labs  10/14/16 1337 10/17/16 1512 02/20/17 0750  WBC 19.0* 8.8 6.4  NEUTROABS  --  7,480 4,205  HGB 11.0* 11.8 13.3  HCT 33.7* 36.1 40.1  MCV 91.8 92.3 88.9  PLT 314 426* 275   Cardiac Enzymes: No results for input(s): CKTOTAL, CKMB, CKMBINDEX, TROPONINI in the last 8760 hours. BNP: Invalid input(s): POCBNP No results found for: HGBA1C Lab Results  Component Value Date   TSH 2.90 05/21/2015   No results found for: VITAMINB12 No results found for: FOLATE No results found for: IRON, TIBC, FERRITIN  Lipid Panel:  Recent Labs  05/23/16 0800 02/20/17 0750  CHOL 183 174  HDL 67 63  LDLCALC 90  --   TRIG 128 123  CHOLHDL 2.7 2.8   No results found for: HGBA1C  Procedures since last visit: No results found.  Assessment/Plan  1. Primary osteoarthritis involving multiple joints Continue vitamin D and glucosamine. Use walker for mobility.  - CBC (no diff); Future - TSH; Future  2. Age-related osteoporosis without current pathological fracture Resume boniva. T socre -2.6 in 8/18.  Continue calcium and vitamin d supplement - ibandronate (BONIVA) 150 MG tablet; Take 1 tablet (150 mg total) by mouth every 30 (thirty) days. Take in the morning with a full glass of water, on an empty stomach, and do not take anything else by mouth or lie down for the next 30 min.  Dispense: 3 tablet; Refill: 3 - CMP  with eGFR; Future - CBC (no diff); Future  3. Hyperlipidemia LDL goal <100 LDL at goal. Decrease pravastatin as below.  - pravastatin (PRAVACHOL) 10 MG tablet; TAKE 1 TABLET BY MOUTH EVERY DAY TO LOWER CHOLESTEROL  Dispense: 90 tablet; Refill: 3 - Lipid Panel; Future - CMP with eGFR; Future  4. Major depression, recurrent, chronic (HCC) Continue bupropion 450 mg daily and venlafaxine 225 mg daily with clonazepam as needed. Has upcoimg psychiatry appointment.  - TSH; Future  5. Spinal stenosis of lumbar region with neurogenic claudication Pending epidural steroid injection on 03/09/17 with Dr Ernestina Patches. Continue tylenol # 3 1 tab q6h prn pain. D/c meloxicam as pt not taking it. Slow position change, avoid heavy weight lifting of > 10 lbs and sudden bending.  6. Essential hypertension Controlled, continue nifedipine 60 mg daily.   7. Gastroesophageal reflux disease without esophagitis Continue protonix for now, helping her  8. OAB (overactive bladder) Supportive care for now. Failed medication trial. Seen by urology in past.  counselled on flu vaccine. [facility]   Labs/tests ordered:  As above  Next appointment: 6 months for physical. MMSE  Communication: reviewed care plan with patient    Blanchie Serve, MD Internal Medicine [place], [place] [address] Cell Phone (Monday-Friday 8 am - 5 pm): [phone] On Call: [phone] and follow prompts after 5 pm and on weekends Office Phone: [phone] Office Fax: [phone]

## 2017-03-06 ENCOUNTER — Telehealth: Payer: Self-pay | Admitting: *Deleted

## 2017-03-06 DIAGNOSIS — M810 Age-related osteoporosis without current pathological fracture: Secondary | ICD-10-CM

## 2017-03-06 MED ORDER — IBANDRONATE SODIUM 150 MG PO TABS: 150.0000 mg | ORAL_TABLET | ORAL | 3 refills | Status: DC

## 2017-03-06 NOTE — Telephone Encounter (Signed)
Patient called requesting a refill on her Meloxicam 7.5mg . I do not see this medication in patient's current medication list, but patient states that she takes it. Is this ok to refill. Patient is requesting 90 day supply. Please Advise.   Patient also requested refill on Boniva. Faxed to pharmacy.   Pharmacy:[facility]

## 2017-03-06 NOTE — Telephone Encounter (Signed)
Dr. Bubba Camp d/c her Meloxicam last clinic visit and her boniva was sent to her pharmacy because I called and verified that they received it. But I will pass the message to Dr. Bubba Camp.

## 2017-03-07 ENCOUNTER — Telehealth: Payer: Self-pay

## 2017-03-07 DIAGNOSIS — F333 Major depressive disorder, recurrent, severe with psychotic symptoms: Secondary | ICD-10-CM | POA: Diagnosis not present

## 2017-03-07 NOTE — Telephone Encounter (Signed)
Spoke with the patient and she stated that she misunderstood why Dr. Bubba Camp took her off the Meloxicam. Patient stated that she is still having pain and that the Tylenol #3 (1 tab q6h prn) is taking the edge off. She stated that she still needs to take the meloxicam to help with the pain. I advised the patient that per Dr. Bubba Camp to take Meloxicam 7.5 mg daily. Meloxicam order is pending.

## 2017-03-09 ENCOUNTER — Ambulatory Visit (INDEPENDENT_AMBULATORY_CARE_PROVIDER_SITE_OTHER): Payer: Medicare Other | Admitting: Physical Medicine and Rehabilitation

## 2017-03-09 ENCOUNTER — Ambulatory Visit (INDEPENDENT_AMBULATORY_CARE_PROVIDER_SITE_OTHER): Payer: Medicare Other

## 2017-03-09 ENCOUNTER — Ambulatory Visit (INDEPENDENT_AMBULATORY_CARE_PROVIDER_SITE_OTHER): Payer: Medicare Other | Admitting: Specialist

## 2017-03-09 ENCOUNTER — Encounter (INDEPENDENT_AMBULATORY_CARE_PROVIDER_SITE_OTHER): Payer: Self-pay | Admitting: Physical Medicine and Rehabilitation

## 2017-03-09 VITALS — BP 159/84 | HR 97

## 2017-03-09 DIAGNOSIS — M5416 Radiculopathy, lumbar region: Secondary | ICD-10-CM | POA: Diagnosis not present

## 2017-03-09 MED ORDER — LIDOCAINE HCL (PF) 1 % IJ SOLN
2.0000 mL | Freq: Once | INTRAMUSCULAR | Status: AC
  Administered 2017-03-09: 2 mL

## 2017-03-09 MED ORDER — BETAMETHASONE SOD PHOS & ACET 6 (3-3) MG/ML IJ SUSP
12.0000 mg | Freq: Once | INTRAMUSCULAR | Status: AC
  Administered 2017-03-09: 12 mg

## 2017-03-09 NOTE — Patient Instructions (Signed)

## 2017-03-09 NOTE — Progress Notes (Deleted)
Right Side Lower Back Pain with Intermittent Right Sided Sciatica Pain

## 2017-03-10 ENCOUNTER — Telehealth: Payer: Self-pay

## 2017-03-10 ENCOUNTER — Other Ambulatory Visit (INDEPENDENT_AMBULATORY_CARE_PROVIDER_SITE_OTHER): Payer: Self-pay | Admitting: Specialist

## 2017-03-10 MED ORDER — ACETAMINOPHEN-CODEINE #3 300-30 MG PO TABS: 1.0000 | ORAL_TABLET | Freq: Four times a day (QID) | ORAL | 0 refills | Status: DC | PRN

## 2017-03-10 NOTE — Telephone Encounter (Signed)
Patient called asking for a refill on tylenol #3. She said she will be out shortly and was wondering if she could possibly get it refilled today. CB # [phone] til lunch today, after lunch try the cell phone. Thank you

## 2017-03-10 NOTE — Telephone Encounter (Signed)
[facility] [address]

## 2017-03-10 NOTE — Telephone Encounter (Signed)
Patient called stating that the instructions on her after visit summary for her Boniva prescription don't match what the instructions say from the pharmacy. Per after visit summary "Start taking your Boniva once a week." Pharmacy instructions state take one pill every 30 days.   Please advise.

## 2017-03-10 NOTE — Telephone Encounter (Signed)
boniva is to be taken once every 30 days. Looks like I typed it right for pharmacy but typed it wrong in AVS section. Please apologize to patient from my side and ask her to take it every 30 days.

## 2017-03-14 DIAGNOSIS — Z23 Encounter for immunization: Secondary | ICD-10-CM | POA: Diagnosis not present

## 2017-03-15 DIAGNOSIS — F411 Generalized anxiety disorder: Secondary | ICD-10-CM | POA: Diagnosis not present

## 2017-03-17 DIAGNOSIS — F411 Generalized anxiety disorder: Secondary | ICD-10-CM | POA: Diagnosis not present

## 2017-03-20 ENCOUNTER — Telehealth (INDEPENDENT_AMBULATORY_CARE_PROVIDER_SITE_OTHER): Payer: Self-pay | Admitting: Specialist

## 2017-03-20 DIAGNOSIS — F411 Generalized anxiety disorder: Secondary | ICD-10-CM | POA: Diagnosis not present

## 2017-03-20 NOTE — Procedures (Signed)
Mrs. Tina Hampton is an 83 year old female with right hip and leg pain and radicular symptoms.  Dr. Louanne Skye request diagnostic and therapeutic right L5-S1 interlaminar epidural steroid injection.  The injection  will be diagnostic and hopefully therapeutic. The patient has failed conservative care including time, medications and activity modification.  Lumbar Epidural Steroid Injection - Interlaminar Approach with Fluoroscopic Guidance  Patient: Tina Hampton      Date of Birth: Jan 23, 1926 MRN: [id number] PCP: Blanchie Serve, MD      Visit Date: 03/09/2017   Universal Protocol:     Consent Given By: the patient  Position: PRONE  Additional Comments: Vital signs were monitored before and after the procedure. Patient was prepped and draped in the usual sterile fashion. The correct patient, procedure, and site was verified.   Injection Procedure Details:  Procedure Site One Meds Administered:  Meds ordered this encounter  Medications  . lidocaine (PF) (XYLOCAINE) 1 % injection 2 mL  . betamethasone acetate-betamethasone sodium phosphate (CELESTONE) injection 12 mg     Laterality: Right  Location/Site:  L5-S1  Needle size: 20 G  Needle type: Tuohy  Needle Placement: Paramedian epidural  Findings:  -Contrast Used: 0.5 mL iohexol 180 mg iodine/mL   -Comments: Excellent flow of contrast into the epidural space.  Procedure Details: Using a paramedian approach from the side mentioned above, the region overlying the inferior lamina was localized under fluoroscopic visualization and the soft tissues overlying this structure were infiltrated with 4 ml. of 1% Lidocaine without Epinephrine. The Tuohy needle was inserted into the epidural space using a paramedian approach.   The epidural space was localized using loss of resistance along with lateral and bi-planar fluoroscopic views.  After negative aspirate for air, blood, and CSF, a 2 ml. volume of Isovue-250 was injected into the  epidural space and the flow of contrast was observed. Radiographs were obtained for documentation purposes.    The injectate was administered into the level noted above.   Additional Comments:  The patient tolerated the procedure well Dressing: Band-Aid    Post-procedure details: Patient was observed during the procedure. Post-procedure instructions were reviewed.  Patient left the clinic in stable condition.

## 2017-03-21 DIAGNOSIS — M25512 Pain in left shoulder: Secondary | ICD-10-CM | POA: Diagnosis not present

## 2017-03-21 DIAGNOSIS — M25511 Pain in right shoulder: Secondary | ICD-10-CM | POA: Diagnosis not present

## 2017-03-21 DIAGNOSIS — M25521 Pain in right elbow: Secondary | ICD-10-CM | POA: Diagnosis not present

## 2017-03-21 NOTE — Telephone Encounter (Signed)
ERROR

## 2017-03-23 ENCOUNTER — Encounter (INDEPENDENT_AMBULATORY_CARE_PROVIDER_SITE_OTHER): Payer: Self-pay | Admitting: Surgery

## 2017-03-23 ENCOUNTER — Telehealth (INDEPENDENT_AMBULATORY_CARE_PROVIDER_SITE_OTHER): Payer: Self-pay | Admitting: Surgery

## 2017-03-23 ENCOUNTER — Ambulatory Visit (INDEPENDENT_AMBULATORY_CARE_PROVIDER_SITE_OTHER): Payer: Medicare Other | Admitting: Surgery

## 2017-03-23 ENCOUNTER — Ambulatory Visit (INDEPENDENT_AMBULATORY_CARE_PROVIDER_SITE_OTHER): Payer: Self-pay

## 2017-03-23 VITALS — BP 143/80 | HR 92 | Ht 63.0 in | Wt 154.0 lb

## 2017-03-23 DIAGNOSIS — M48062 Spinal stenosis, lumbar region with neurogenic claudication: Secondary | ICD-10-CM

## 2017-03-23 MED ORDER — ACETAMINOPHEN-CODEINE #3 300-30 MG PO TABS: 1.0000 | ORAL_TABLET | Freq: Three times a day (TID) | ORAL | 0 refills | Status: DC | PRN

## 2017-03-23 NOTE — Telephone Encounter (Signed)
I think this is your patient!

## 2017-03-23 NOTE — Telephone Encounter (Signed)
[place], OT, from [place] called today stating that she has some questions about the patient's activity as far as exercises.  CB#[phone].  Thank you.

## 2017-03-24 DIAGNOSIS — F411 Generalized anxiety disorder: Secondary | ICD-10-CM | POA: Diagnosis not present

## 2017-03-27 ENCOUNTER — Other Ambulatory Visit (INDEPENDENT_AMBULATORY_CARE_PROVIDER_SITE_OTHER): Payer: Self-pay | Admitting: Specialist

## 2017-03-27 ENCOUNTER — Telehealth (INDEPENDENT_AMBULATORY_CARE_PROVIDER_SITE_OTHER): Payer: Self-pay | Admitting: Specialist

## 2017-03-27 ENCOUNTER — Telehealth (INDEPENDENT_AMBULATORY_CARE_PROVIDER_SITE_OTHER): Payer: Self-pay | Admitting: Radiology

## 2017-03-27 MED ORDER — ACETAMINOPHEN-CODEINE #3 300-30 MG PO TABS: 1.0000 | ORAL_TABLET | Freq: Four times a day (QID) | ORAL | 0 refills | Status: DC | PRN

## 2017-03-27 NOTE — Telephone Encounter (Signed)
Patient left voicemail stating that Tina Hampton wrote her a prescription for Tylenol #3 that said she can take one every 8 hours. She states Dr. Louanne Skye usually writes the prescription for one every 6 hours and she would like for the script Tina Hampton sent in be changed to one tablet every 6 hours. [facility] [address]----Please advise

## 2017-03-27 NOTE — Telephone Encounter (Signed)
Patient left voicemail stating that Jeneen Rinks wrote her a prescription for Tylenol #3 that said she can take one every 8 hours. She states Dr. Louanne Skye usually writes the prescription for one every 6 hours and she would like for the script Jeneen Rinks sent in be changed to one tablet every 6 hours. [facility] [address]  Please call patient back to advise. [phone]

## 2017-03-27 NOTE — Telephone Encounter (Signed)
Rx changed to one Tylenol#3 tablet po every 6 hours as need for pain. jen

## 2017-03-27 NOTE — Telephone Encounter (Signed)
[facility]ill [facility] [address]

## 2017-03-28 DIAGNOSIS — F333 Major depressive disorder, recurrent, severe with psychotic symptoms: Secondary | ICD-10-CM | POA: Diagnosis not present

## 2017-03-28 DIAGNOSIS — M25512 Pain in left shoulder: Secondary | ICD-10-CM | POA: Diagnosis not present

## 2017-03-28 DIAGNOSIS — M25521 Pain in right elbow: Secondary | ICD-10-CM | POA: Diagnosis not present

## 2017-03-28 DIAGNOSIS — M25511 Pain in right shoulder: Secondary | ICD-10-CM | POA: Diagnosis not present

## 2017-03-28 NOTE — Telephone Encounter (Signed)
I [facility], I tried to call patient but [facility] for her advising the rx was called in for her

## 2017-03-28 NOTE — Telephone Encounter (Signed)
done

## 2017-03-28 NOTE — Telephone Encounter (Signed)
[place], OT, from [place] called today stating that she has some questions about the patient's activity as far as exercises. --What is the patient able to do?

## 2017-03-30 ENCOUNTER — Ambulatory Visit (INDEPENDENT_AMBULATORY_CARE_PROVIDER_SITE_OTHER): Payer: Medicare Other | Admitting: Specialist

## 2017-03-31 DIAGNOSIS — F411 Generalized anxiety disorder: Secondary | ICD-10-CM | POA: Diagnosis not present

## 2017-04-05 ENCOUNTER — Encounter: Payer: Self-pay | Admitting: Internal Medicine

## 2017-04-05 ENCOUNTER — Non-Acute Institutional Stay: Payer: Medicare Other | Admitting: Internal Medicine

## 2017-04-05 DIAGNOSIS — R2681 Unsteadiness on feet: Secondary | ICD-10-CM

## 2017-04-05 DIAGNOSIS — I10 Essential (primary) hypertension: Secondary | ICD-10-CM

## 2017-04-05 DIAGNOSIS — K219 Gastro-esophageal reflux disease without esophagitis: Secondary | ICD-10-CM | POA: Diagnosis not present

## 2017-04-05 DIAGNOSIS — F339 Major depressive disorder, recurrent, unspecified: Secondary | ICD-10-CM

## 2017-04-05 DIAGNOSIS — M48062 Spinal stenosis, lumbar region with neurogenic claudication: Secondary | ICD-10-CM | POA: Diagnosis not present

## 2017-04-05 DIAGNOSIS — M810 Age-related osteoporosis without current pathological fracture: Secondary | ICD-10-CM | POA: Diagnosis not present

## 2017-04-05 DIAGNOSIS — E785 Hyperlipidemia, unspecified: Secondary | ICD-10-CM | POA: Diagnosis not present

## 2017-04-05 DIAGNOSIS — N3281 Overactive bladder: Secondary | ICD-10-CM

## 2017-04-05 NOTE — Progress Notes (Signed)
Provider:  Blanchie Serve MD  Location:  [facility] Room Number: 31 Place of Service:  ALF (13)  PCP: Blanchie Serve, MD Patient Care Team: Blanchie Serve, MD as PCP - General (Internal Medicine) [place], [facility] Clent Jacks, MD as Consulting Physician (Ophthalmology) Milus Banister, MD as Consulting Physician (Gastroenterology) Norma Fredrickson, MD as Consulting Physician (Psychiatry) Jessy Oto, MD as Consulting Physician (Orthopedic Surgery) Bjorn Loser, MD as Consulting Physician (Urology)  Extended Emergency Contact Information Primary Emergency Contact: [place], [place], [place] [address] Johnnette Litter of [place] Phone: [phone] Mobile Phone: [phone] Relation: Son  Code Status: DNR  Goals of Care: Advanced Directive information Advanced Directives 04/05/2017  Does Patient Have a Medical Advance Directive? Yes  Type of Paramedic of [place];Living will;Out of facility DNR (pink MOST or yellow form)  Does patient want to make changes to medical advance directive? No - Patient declined  Copy of [place] in Chart? Yes  Pre-existing out of facility DNR order (yellow form or pink MOST form) Pink MOST form placed in chart (order not valid for inpatient use)      Chief Complaint  Patient presents with  . New Patient (Initial Visit)    New Admission to assisted living     HPI: Patient is a 83 y.o. female seen today for admission visit. She was residing in independent living and due to her progressive back pain and worsening depression, pt has moved to ALF for further care. She is seen in her room today. She is followed by psychiatry service for her depression. She feels this has acted up and medication changes have been made by her pyschiatrist. She would like to work with therapy and strengthen her gait and improve her balance.   Past Medical History:  Diagnosis Date   . Abnormal mammogram, unspecified 04/19/2011  . Allergy   . Anxiety   . Backache, unspecified 01/21/2005  . Cervicalgia 08/20/2013  . Chronic kidney disease, stage II (mild) 04/03/2009  . Chronic rhinitis 09/28/2010  . DDD (degenerative disc disease)   . Depression   . Dermatophytosis of nail 01/21/2005  . Diverticulosis of colon (without mention of hemorrhage) 06/17/2005  . Edema 11/26/2004  . GERD (gastroesophageal reflux disease)   . Hiatal hernia   . High cholesterol   . Hyperglycemia 11/18/2014  . Hypertension   . Insomnia, unspecified 10/18/2011  . Left inguinal hernia 04/24/2014  . Nocturia 09/19/2008  . Osteoporosis, senile 11/18/2014  . Other abnormal blood chemistry 04/03/2007  . Pain in joint, pelvic region and thigh 11/12/2011  . Pain in joint, shoulder region 08/15/2006  . Pain in limb 08/26/2005  . Palpitations 09/29/2009  . PAOD (peripheral arterial occlusive disease) ([place]) 11/18/2014   Right leg; diminished popliteal, dorsalis pedis, and posterior tibial artery pulsations   . Postmenopausal atrophic vaginitis 12/14/2004  . Scoliosis   . Senile osteoporosis 12/14/2004  . Tension headache 10/04/2005  . Unspecified cataract 11/26/2004  . Unspecified constipation 01/21/2005  . Unspecified urinary incontinence 11/26/2004  . Vaginal stenosis 11/18/2014   Tight band about 1/2 inches into the vagina which will not allow passage of my index finger.    Past Surgical History:  Procedure Laterality Date  . ABDOMINAL HYSTERECTOMY  1969   Dr.Braun   . bilateral eyelid surgery  2005   Dr.Scott  . BILATERAL OOPHORECTOMY  2002  . BREAST SURGERY  benign tumor removal, left breast 1969  . CATARACT EXTRACTION     Right  . CHOLECYSTECTOMY  1990   Dr.Moore   . COLONOSCOPY  3/18/20008   inflammation, diverticular associated colitis -Dr. Ardis Hughs  . COSMETIC SURGERY    . ESOPHAGOGASTRODUODENOSCOPY N/A 01/24/2014   Procedure: ESOPHAGOGASTRODUODENOSCOPY (EGD);  Surgeon: Irene Shipper, MD;  Location: [facility]  ENDOSCOPY;  Service: Endoscopy;  Laterality: N/A;  . ESOPHAGOGASTRODUODENOSCOPY ENDOSCOPY  04/04/14   Dr. Ardis Hughs  . EYE SURGERY    . FOREIGN BODY REMOVAL N/A 01/24/2014   Procedure: FOREIGN BODY REMOVAL;  Surgeon: Irene Shipper, MD;  Location: [facility];  Service: Endoscopy;  Laterality: N/A;  . HIP PINNING,CANNULATED  11/07/2011   Procedure: CANNULATED HIP PINNING;  Surgeon: Jessy Oto, MD;  Location: [facility];  Service: Orthopedics;  Laterality: Left;  . ORIF FEMORAL NECK FRACTURE W/ [facility]  11/07/2011   Dr.Nitka   . TONSILLECTOMY     Dr.Joe Tamala Julian  . TUBAL LIGATION    . VAGINAL PROLAPSE REPAIR  2002   Dr.Horback     reports that  has never smoked. she has never used smokeless tobacco. She reports that she drinks about 4.2 oz of alcohol per week. She reports that she does not use drugs. Social History   Socioeconomic History  . Marital status: Widowed    Spouse name: Not on file  . Number of children: Not on file  . Years of education: Not on file  . Highest education level: Not on file  Social Needs  . Financial resource strain: Not on file  . Food insecurity - worry: Not on file  . Food insecurity - inability: Not on file  . Transportation needs - medical: Not on file  . Transportation needs - non-medical: Not on file  Occupational History  . 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 Smoking status: Never Smoker  . Smokeless tobacco: Never Used  Substance and Sexual Activity  . Alcohol use: Yes    Alcohol/week: 4.2 oz    Types: 7 Glasses of wine per week    Comment: one glass of wine in evening  . Drug use: No  . Sexual activity: No  Other Topics Concern  . Not on file  Social History Narrative   [facility] at [facility] since 04/14/2005   Widowed (husband expired 2014)   Has Living Will, POA, MOST   Exercise: water aerobic  5 times a week   Walks with walker   Never smoked   Drinks moderate amount of caffeinate beverages daily    Alcohol none       Functional Status Survey:    Family History  Problem Relation Age of Onset  . Heart disease Mother   . Stroke Father   . Heart disease Brother   . Esophageal cancer Neg Hx   . Stomach cancer Neg Hx   . Colon cancer Neg Hx     Health Maintenance  Topic Date Due  . INFLUENZA VACCINE  12/14/2016  . TETANUS/TDAP  05/16/2018 (Originally 05/16/2014)  . MAMMOGRAM  12/20/2017  . DEXA SCAN  Completed  . PNA vac Low Risk Adult  Completed    Allergies  Allergen Reactions  . Penicillins Other (See Comments)    CHILDHOOD REACTION  . Abilify [Aripiprazole] Rash    Outpatient Encounter Medications as of 04/05/2017  Medication Sig  . acetaminophen-codeine (TYLENOL #3) 300-30 MG tablet Take 1 tablet by mouth every  6 (six) hours as needed for moderate pain.  Marland Kitchen antiseptic oral rinse (BIOTENE) LIQD 15 mLs by Mouth Rinse route every 4 (four) hours as needed for dry mouth.   Marland Kitchen aspirin 81 MG tablet Take 81 mg by mouth daily.  Marland Kitchen buPROPion (WELLBUTRIN XL) 150 MG 24 hr tablet Take 450 mg by mouth daily.  . Calcium Carb-Cholecalciferol (CALTRATE 600+D) 600-800 MG-UNIT TABS Take 1 tablet by mouth 2 (two) times daily.  . chlorpheniramine-HYDROcodone (TUSSIONEX PENNKINETIC ER) 10-8 MG/5ML SUER Take 5 mLs by mouth every 12 (twelve) hours as needed for cough.   . cholecalciferol (VITAMIN D) 1000 UNITS tablet Take 1,000 Units by mouth daily.  . clonazePAM (KLONOPIN) 0.5 MG tablet Take 0.25 mg by mouth 2 (two) times daily. 8 am and 2 pm.  . clonazePAM (KLONOPIN) 0.5 MG tablet Take 0.25 mg by mouth daily as needed for anxiety (in addition to the scheduled dose).  Marland Kitchen escitalopram (LEXAPRO) 20 MG tablet Take 20 mg by mouth daily.  . Glucosamine HCl 1000 MG TABS Take 2,000 mg by mouth 2 (two) times daily.  Marland Kitchen ibandronate (BONIVA) 150 MG tablet Take 1 tablet (150 mg total) by mouth every 30 (thirty) days. Take in the morning with a full glass of water, on an empty stomach, and do not take  anything else by mouth or lie down for the next 30 min.  . meloxicam (MOBIC) 7.5 MG tablet TAKE 1 TABLET BY MOUTH EVERY DAY AS NEEDED FOR ARTHRITIS  . Menthol, Topical Analgesic, (BIOFREEZE EX) Apply 1 application topically daily as needed.   . Multiple Vitamin (MULTIVITAMIN WITH MINERALS) TABS Take 1 tablet by mouth daily.  Marland Kitchen NIFEdipine (PROCARDIA XL/ADALAT-CC) 60 MG 24 hr tablet TAKE 1 TABLET BY MOUTH EVERY DAY TO CONTROL BLOOD PRESSURE  . pantoprazole (PROTONIX) 40 MG tablet One daily to reduce stomach acid  . Polyethyl Glycol-Propyl Glycol (SYSTANE) 0.4-0.3 % SOLN Place 1 drop into both eyes 2 (two) times daily.   . pravastatin (PRAVACHOL) 10 MG tablet Take 10 mg by mouth daily.  . Venlafaxine HCl (EFFEXOR PO) Take 37.5 mg by mouth daily with breakfast. Stop date 04/19/17  . [DISCONTINUED] acetaminophen-codeine (TYLENOL #3) 300-30 MG tablet Take 1 tablet every 6 (six) hours as needed by mouth for moderate pain.  . [DISCONTINUED] LORazepam (ATIVAN) 0.5 MG tablet Take 0.5 mg by mouth every 8 (eight) hours as needed for anxiety.   No facility-administered encounter medications on file as of 04/05/2017.     Review of Systems  Constitutional: Positive for fatigue. Negative for appetite change, chills and fever.  HENT: Positive for hearing loss. Negative for congestion, rhinorrhea, sinus pressure and trouble swallowing.   Respiratory: Negative for cough and shortness of breath.   Cardiovascular: Negative for chest pain and palpitations.  Gastrointestinal: Negative for abdominal pain, constipation, diarrhea, nausea and vomiting.  Genitourinary: Positive for frequency. Negative for dysuria.       Has urinary incontinence  Musculoskeletal: Positive for arthralgias, back pain and gait problem. Negative for joint swelling.  Skin: Negative for rash and wound.  Neurological: Negative for dizziness, numbness and headaches.  Psychiatric/Behavioral: Positive for behavioral problems, decreased  concentration and dysphoric mood. Negative for hallucinations and suicidal ideas.    Vitals:   04/05/17 1522  BP: 108/67  Pulse: 94  Resp: 18  Temp: 98.4 F (36.9 C)  TempSrc: Oral  SpO2: 92%  Weight: 145 lb (65.8 kg)  Height: 5\' 2"  (1.575 m)   Body mass index is 26.52 kg/m.  Physical Exam  Constitutional: She is oriented to person, place, and time. She appears well-developed and well-nourished. No distress.  HENT:  Head: Normocephalic and atraumatic.  Mouth/Throat: Oropharynx is clear and moist. No oropharyngeal exudate.  Eyes: Conjunctivae and EOM are normal. Pupils are equal, round, and reactive to light. Right eye exhibits no discharge. Left eye exhibits no discharge.  Neck: Neck supple.  Cardiovascular: Normal rate and regular rhythm.  Pulmonary/Chest: Effort normal and breath sounds normal. No respiratory distress. She has no wheezes. She has no rales.  Abdominal: Soft. Bowel sounds are normal. There is no tenderness. There is no guarding.  Musculoskeletal: She exhibits deformity.  Can move all 4 extremities. Unsteady gait. Rolling walker to ambulate. Arthritis changes present. Lumbar spinal tenderness. Scoliosis present  Lymphadenopathy:    She has no cervical adenopathy.  Neurological: She is alert and oriented to person, place, and time.  Skin: Skin is warm and dry. No rash noted. She is not diaphoretic.  Psychiatric:  Flat affect, anxious, participates in conversation but melancholic    Labs reviewed: Basic Metabolic Panel: Recent Labs    10/14/16 1337 10/17/16 1512 02/20/17 0750  NA 130* 137 140  K 4.0 4.5 4.3  CL 95* 100 103  CO2 26 25 27   GLUCOSE 106* 172* 106*  BUN 21 13 15   CREATININE 0.98* 0.78 0.94*  CALCIUM 8.7 9.6 10.0   Liver Function Tests: Recent Labs    05/23/16 0800 10/14/16 1337 10/17/16 1512 02/20/17 0750  AST 25 38* 25 19  ALT 17 48* 37* 13  ALKPHOS 59 72 61  --   BILITOT 0.4 0.5 0.2 0.4  PROT 7.1 5.6* 6.2 6.9  ALBUMIN 4.2  3.3* 3.6  --    No results for input(s): LIPASE, AMYLASE in the last 8760 hours. No results for input(s): AMMONIA in the last 8760 hours. CBC: Recent Labs    10/14/16 1337 10/17/16 1512 02/20/17 0750  WBC 19.0* 8.8 6.4  NEUTROABS  --  7,480 4,205  HGB 11.0* 11.8 13.3  HCT 33.7* 36.1 40.1  MCV 91.8 92.3 88.9  PLT 314 426* 275   Cardiac Enzymes: No results for input(s): CKTOTAL, CKMB, CKMBINDEX, TROPONINI in the last 8760 hours. BNP: Invalid input(s): POCBNP No results found for: HGBA1C Lab Results  Component Value Date   TSH 2.90 05/21/2015   No results found for: VITAMINB12 No results found for: FOLATE No results found for: IRON, TIBC, FERRITIN  Imaging and Procedures obtained prior to SNF admission: Ct Abdomen Pelvis W Contrast  Result Date: 04/20/2014 CLINICAL DATA:  LEFT lower quadrant pain, history of colonic diverticulosis, question diverticulitis EXAM: CT ABDOMEN AND PELVIS WITH CONTRAST TECHNIQUE: Multidetector CT imaging of the abdomen and pelvis was performed using the standard protocol following bolus administration of intravenous contrast. Sagittal and coronal MPR images reconstructed from axial data set. CONTRAST:  53mL OMNIPAQUE IOHEXOL 300 MG/ML SOLN IV. Dilute oral contrast. COMPARISON:  08/28/2013 FINDINGS: Minimal bibasilar atelectasis. Large hiatal hernia containing majority of stomach without outlet obstruction. Gallbladder surgically absent. Liver, spleen, pancreas, kidneys, and adrenal glands normal appearance. Calcified and tortuous thoracic aorta. Nonobstructive bowel gas pattern. No bowel dilatation the or wall thickening. Diverticulosis of descending and sigmoid colon without definite pericolic inflammatory changes to suggest acute diverticulitis. Bladder and ureters unremarkable. Small LEFT inguinal hernia containing fat. No mass, adenopathy, free fluid, or free air. Uterus surgically absent with nonvisualization of ovaries. Normal appendix. Severe  multilevel degenerative disc and facet disease changes of the thoracolumbar spine  with pronounced dextro convex scoliosis. IMPRESSION: Significant diverticulosis of descending and sigmoid colon without definite CT evidence of acute diverticulitis. Large hiatal hernia containing majority of stomach similar to previous exam. Small LEFT inguinal hernia containing fat. No definite acute intra-abdominal or intrapelvic abnormalities. Electronically Signed   By: Lavonia Dana M.D.   On: 04/20/2014 19:01    Assessment/Plan  Unsteady gait With lumbar spinal stenosis with neurogenic claudication. Will have her work with physical therapy and occupational therapy team to help with gait training and muscle strengthening exercises.fall precautions. Skin care. Encourage to be out of bed.   Lumbar spinal stenosis with neurogenic claudication Followed by Dr Louanne Skye. Continue her acetaminophen with codeine 1 tab q6h prn pain and monitor. Continue biofreeze and meloxicam.   Major recurrent depression Followed by psychiatry service. Current medication verified by facility with psychiatry service. Continue venlafaxine 37.5 mg daily with bupropion 450 mg daily and lexapro 20 mg daily. continue clonazepam current regimen.  gerd Controlled symptom. Continue protonix 40 mg daily.   Osteoporosis Fall precautions. Continue ibandronate with calcium and vitamin d supplement.   HLD Continue her pravastatin 20 mg daily.  OAB With UI. Seen by urology in past and symptomatic management for now. Perineal care.   HTN Continue nifedipine and monitor BP   Family/ staff Communication: reviewed care plan with patient and charge nurse. Goal is for her to return to Independent living   Labs/tests ordered: none  Blanchie Serve, MD Internal Medicine [place], [place] [address] Cell Phone (Monday-Friday 8 am - 5 pm): [phone] On Call: [phone] and follow  prompts after 5 pm and on weekends Office Phone: [phone] Office Fax: [phone]

## 2017-04-09 DIAGNOSIS — R2681 Unsteadiness on feet: Secondary | ICD-10-CM | POA: Diagnosis not present

## 2017-04-09 DIAGNOSIS — R29898 Other symptoms and signs involving the musculoskeletal system: Secondary | ICD-10-CM | POA: Diagnosis not present

## 2017-04-09 DIAGNOSIS — M6281 Muscle weakness (generalized): Secondary | ICD-10-CM | POA: Diagnosis not present

## 2017-04-10 DIAGNOSIS — R29898 Other symptoms and signs involving the musculoskeletal system: Secondary | ICD-10-CM | POA: Diagnosis not present

## 2017-04-10 DIAGNOSIS — R2681 Unsteadiness on feet: Secondary | ICD-10-CM | POA: Diagnosis not present

## 2017-04-10 DIAGNOSIS — M6281 Muscle weakness (generalized): Secondary | ICD-10-CM | POA: Diagnosis not present

## 2017-04-10 NOTE — Progress Notes (Signed)
Opened in error

## 2017-04-10 NOTE — Patient Instructions (Signed)
Opened in error

## 2017-04-11 ENCOUNTER — Ambulatory Visit
Admission: RE | Admit: 2017-04-11 | Discharge: 2017-04-11 | Disposition: A | Discharge status: Home / Self Care | Payer: Medicare Other | Source: Ambulatory Visit | Attending: Surgery | Admitting: Surgery

## 2017-04-11 DIAGNOSIS — M4807 Spinal stenosis, lumbosacral region: Secondary | ICD-10-CM | POA: Diagnosis not present

## 2017-04-11 DIAGNOSIS — M48062 Spinal stenosis, lumbar region with neurogenic claudication: Secondary | ICD-10-CM

## 2017-04-12 DIAGNOSIS — M6281 Muscle weakness (generalized): Secondary | ICD-10-CM | POA: Diagnosis not present

## 2017-04-12 DIAGNOSIS — F411 Generalized anxiety disorder: Secondary | ICD-10-CM | POA: Diagnosis not present

## 2017-04-12 DIAGNOSIS — R29898 Other symptoms and signs involving the musculoskeletal system: Secondary | ICD-10-CM | POA: Diagnosis not present

## 2017-04-12 DIAGNOSIS — R2681 Unsteadiness on feet: Secondary | ICD-10-CM | POA: Diagnosis not present

## 2017-04-13 DIAGNOSIS — M6281 Muscle weakness (generalized): Secondary | ICD-10-CM | POA: Diagnosis not present

## 2017-04-13 DIAGNOSIS — R29898 Other symptoms and signs involving the musculoskeletal system: Secondary | ICD-10-CM | POA: Diagnosis not present

## 2017-04-13 DIAGNOSIS — R2681 Unsteadiness on feet: Secondary | ICD-10-CM | POA: Diagnosis not present

## 2017-04-14 ENCOUNTER — Encounter: Payer: Self-pay | Admitting: Family

## 2017-04-14 ENCOUNTER — Encounter: Payer: Self-pay | Admitting: Internal Medicine

## 2017-04-14 ENCOUNTER — Non-Acute Institutional Stay: Payer: Medicare Other | Admitting: Family

## 2017-04-14 DIAGNOSIS — N390 Urinary tract infection, site not specified: Secondary | ICD-10-CM | POA: Diagnosis not present

## 2017-04-14 DIAGNOSIS — R2681 Unsteadiness on feet: Secondary | ICD-10-CM | POA: Diagnosis not present

## 2017-04-14 DIAGNOSIS — M6281 Muscle weakness (generalized): Secondary | ICD-10-CM | POA: Diagnosis not present

## 2017-04-14 DIAGNOSIS — R399 Unspecified symptoms and signs involving the genitourinary system: Secondary | ICD-10-CM | POA: Diagnosis not present

## 2017-04-14 DIAGNOSIS — F339 Major depressive disorder, recurrent, unspecified: Secondary | ICD-10-CM

## 2017-04-14 DIAGNOSIS — R29898 Other symptoms and signs involving the musculoskeletal system: Secondary | ICD-10-CM | POA: Diagnosis not present

## 2017-04-14 DIAGNOSIS — F411 Generalized anxiety disorder: Secondary | ICD-10-CM | POA: Diagnosis not present

## 2017-04-14 NOTE — Progress Notes (Signed)
This encounter was created in error - please disregard.

## 2017-04-14 NOTE — Progress Notes (Addendum)
Location:  [place] Room Number: 31 Place of Service:  ALF [phone]) Provider: Dinah Ngetich FNP-C  Blanchie Serve, MD  Patient Care Team: Blanchie Serve, MD as PCP - General (Internal Medicine) Melina Modena, [facility] Clent Jacks, MD as Consulting Physician (Ophthalmology) Milus Banister, MD as Consulting Physician (Gastroenterology) Norma Fredrickson, MD as Consulting Physician (Psychiatry) Jessy Oto, MD as Consulting Physician (Orthopedic Surgery) Bjorn Loser, MD as Consulting Physician (Urology)  Extended Emergency Contact Information Primary Emergency Contact: [place], [place] [address] Johnnette Litter of [place] Phone: [phone] Mobile Phone: [phone] Relation: Son  Code Status:  DNR Goals of care: Advanced Directive information Advanced Directives 04/14/2017  Does Patient Have a Medical Advance Directive? Yes  Type of Advance Directive Living will;Healthcare Power of Homeland;Out of facility DNR (pink MOST or yellow form)  Does patient want to make changes to medical advance directive? -  Copy of [place] in Chart? Yes  Pre-existing out of facility DNR order (yellow form or pink MOST form) Pink MOST form placed in chart (order not valid for inpatient use)     Chief Complaint  Patient presents with  . Acute Visit    ? UTI-urine has strong odor, frequency, incontinence    HPI:  Pt is a 83 y.o. female seen today at [facility] for an acute visit for evaluation of increased urine frequency, incontinence and strong odor. She is seen in her room today. Also reports lower back pain though has had chronic back pain.She denies any fever or chills.      Past Medical History:  Diagnosis Date  . Abnormal mammogram, unspecified 04/19/2011  . Allergy   . Anxiety   . Backache, unspecified 01/21/2005  . Cervicalgia 08/20/2013  . Chronic kidney disease, stage II (mild) 04/03/2009  . Chronic  rhinitis 09/28/2010  . DDD (degenerative disc disease)   . Depression   . Dermatophytosis of nail 01/21/2005  . Diverticulosis of colon (without mention of hemorrhage) 06/17/2005  . Edema 11/26/2004  . GERD (gastroesophageal reflux disease)   . Hiatal hernia   . High cholesterol   . Hyperglycemia 11/18/2014  . Hypertension   . Insomnia, unspecified 10/18/2011  . Left inguinal hernia 04/24/2014  . Nocturia 09/19/2008  . Osteoporosis, senile 11/18/2014  . Other abnormal blood chemistry 04/03/2007  . Pain in joint, pelvic region and thigh 11/12/2011  . Pain in joint, shoulder region 08/15/2006  . Pain in limb 08/26/2005  . Palpitations 09/29/2009  . PAOD (peripheral arterial occlusive disease) ([place]) 11/18/2014   Right leg; diminished popliteal, dorsalis pedis, and posterior tibial artery pulsations   . Postmenopausal atrophic vaginitis 12/14/2004  . Scoliosis   . Senile osteoporosis 12/14/2004  . Tension headache 10/04/2005  . Unspecified cataract 11/26/2004  . Unspecified constipation 01/21/2005  . Unspecified urinary incontinence 11/26/2004  . Vaginal stenosis 11/18/2014   Tight band about 1/2 inches into the vagina which will not allow passage of my index finger.    Past Surgical History:  Procedure Laterality Date  . ABDOMINAL HYSTERECTOMY  1969   Dr.Braun   . bilateral eyelid surgery  2005   Dr.Scott  . BILATERAL OOPHORECTOMY  2002  . BREAST SURGERY     benign tumor removal, left breast 1969  . CATARACT EXTRACTION     Right  . CHOLECYSTECTOMY  1990   Dr.Moore   . COLONOSCOPY  3/18/20008   inflammation, diverticular associated colitis -  Dr. Ardis Hughs  . COSMETIC SURGERY    . ESOPHAGOGASTRODUODENOSCOPY N/A 01/24/2014   Procedure: ESOPHAGOGASTRODUODENOSCOPY (EGD);  Surgeon: Irene Shipper, MD;  Location: [facility] ENDOSCOPY;  Service: Endoscopy;  Laterality: N/A;  . ESOPHAGOGASTRODUODENOSCOPY ENDOSCOPY  04/04/14   Dr. Ardis Hughs  . EYE SURGERY    . FOREIGN BODY REMOVAL N/A 01/24/2014   Procedure: FOREIGN BODY  REMOVAL;  Surgeon: Irene Shipper, MD;  Location: [place];  Service: Endoscopy;  Laterality: N/A;  . HIP PINNING,CANNULATED  11/07/2011   Procedure: CANNULATED HIP PINNING;  Surgeon: Jessy Oto, MD;  Location: [facility] ORS;  Service: Orthopedics;  Laterality: Left;  . ORIF FEMORAL NECK FRACTURE W/ [facility]  11/07/2011   Dr.Nitka   . TONSILLECTOMY     Dr.Joe Tamala Julian  . TUBAL LIGATION    . VAGINAL PROLAPSE REPAIR  2002   Dr.Horback     Allergies  Allergen Reactions  . Penicillins Other (See Comments)    CHILDHOOD REACTION  . Abilify [Aripiprazole] Rash    Outpatient Encounter Medications as of 04/14/2017  Medication Sig  . acetaminophen-codeine (TYLENOL #3) 300-30 MG tablet Take 1 tablet by mouth every 6 (six) hours as needed for moderate pain.  Marland Kitchen antiseptic oral rinse (BIOTENE) LIQD 15 mLs by Mouth Rinse route every 4 (four) hours as needed for dry mouth.   Marland Kitchen aspirin 81 MG tablet Take 81 mg by mouth daily.  Marland Kitchen buPROPion (WELLBUTRIN XL) 150 MG 24 hr tablet Take 450 mg by mouth daily.  . Calcium Carb-Cholecalciferol (CALTRATE 600+D) 600-800 MG-UNIT TABS Take 1 tablet by mouth 2 (two) times daily.  . chlorpheniramine-HYDROcodone (TUSSIONEX PENNKINETIC ER) 10-8 MG/5ML SUER Take 5 mLs by mouth every 12 (twelve) hours as needed for cough.   . cholecalciferol (VITAMIN D) 1000 UNITS tablet Take 1,000 Units by mouth daily.  . clonazePAM (KLONOPIN) 0.5 MG tablet Take 0.25 mg by mouth 2 (two) times daily. 8 am and 2 pm.  . clonazePAM (KLONOPIN) 0.5 MG tablet Take 0.25 mg by mouth daily as needed for anxiety (in addition to the scheduled dose).  Marland Kitchen escitalopram (LEXAPRO) 20 MG tablet Take 20 mg by mouth daily.  . Glucosamine HCl 1000 MG TABS Take 2,000 mg by mouth 2 (two) times daily.  Marland Kitchen ibandronate (BONIVA) 150 MG tablet Take 1 tablet (150 mg total) by mouth every 30 (thirty) days. Take in the morning with a full glass of water, on an empty stomach, and do not take anything else by mouth or lie down for  the next 30 min.  . meloxicam (MOBIC) 7.5 MG tablet TAKE 1 TABLET BY MOUTH EVERY DAY AS NEEDED FOR ARTHRITIS  . Menthol, Topical Analgesic, (BIOFREEZE EX) Apply 1 application topically daily as needed.   . Multiple Vitamin (MULTIVITAMIN WITH MINERALS) TABS Take 1 tablet by mouth daily.  Marland Kitchen NIFEdipine (PROCARDIA XL/ADALAT-CC) 60 MG 24 hr tablet TAKE 1 TABLET BY MOUTH EVERY DAY TO CONTROL BLOOD PRESSURE  . pantoprazole (PROTONIX) 40 MG tablet One daily to reduce stomach acid  . Polyethyl Glycol-Propyl Glycol (SYSTANE) 0.4-0.3 % SOLN Place 1 drop into both eyes 2 (two) times daily.   . pravastatin (PRAVACHOL) 10 MG tablet Take 10 mg by mouth daily.  . Venlafaxine HCl (EFFEXOR PO) Take 37.5 mg by mouth daily with breakfast. Stop date 04/19/17   No facility-administered encounter medications on file as of 04/14/2017.     Review of Systems  Constitutional: Negative for activity change, appetite change, chills, fatigue and fever.  Respiratory: Negative for cough,  chest tightness, shortness of breath and wheezing.   Cardiovascular: Negative for chest pain, palpitations and leg swelling.  Gastrointestinal: Negative for abdominal distention, abdominal pain, constipation, diarrhea, nausea and vomiting.  Genitourinary: Positive for frequency. Negative for dysuria and urgency.  Musculoskeletal: Positive for back pain and gait problem.  Skin: Negative for color change, pallor and rash.  Neurological: Negative for dizziness, syncope, light-headedness and headaches.  Psychiatric/Behavioral: Negative for agitation, confusion and sleep disturbance.    Immunization History  Administered Date(s) Administered  . Influenza Whole 02/14/2012, 02/14/2013  . Influenza-Unspecified 02/27/2014, 02/12/2015, 02/25/2016  . Pneumococcal Conjugate-13 07/13/2015  . Pneumococcal Polysaccharide-23 05/16/1998  . Td 05/16/2004  . Zoster 05/16/2005   Pertinent  Health Maintenance Due  Topic Date Due  . INFLUENZA VACCINE   12/14/2016  . MAMMOGRAM  12/20/2017  . DEXA SCAN  Completed  . PNA vac Low Risk Adult  Completed   Fall Risk  10/17/2016 10/14/2016 10/13/2016 05/31/2016 10/20/2015  Falls in the past year? No No No No No    Vitals:   04/14/17 1110  BP: (!) 168/88  Pulse: 87  Resp: 19  Temp: 98.5 F (36.9 C)  SpO2: 94%  Weight: 136 lb 3.2 oz (61.8 kg)  Height: 5\' 2"  (1.575 m)   Body mass index is 24.91 kg/m. Physical Exam  Constitutional: She is oriented to person, place, and time. She appears well-developed and well-nourished. No distress.  HENT:  Head: Normocephalic.  Right Ear: External ear normal.  Left Ear: External ear normal.  Mouth/Throat: Oropharynx is clear and moist. No oropharyngeal exudate.  Eyes: Conjunctivae and EOM are normal. Pupils are equal, round, and reactive to light. Right eye exhibits no discharge. Left eye exhibits no discharge.  Neck: Normal range of motion. No JVD present. No thyromegaly present.  Cardiovascular: Normal rate, regular rhythm, normal heart sounds and intact distal pulses. Exam reveals no gallop and no friction rub.  No murmur heard. Pulmonary/Chest: Effort normal and breath sounds normal. No respiratory distress. She has no wheezes. She has no rales.  Abdominal: Soft. Bowel sounds are normal. She exhibits no distension. There is no tenderness. There is no rebound and no guarding.  Musculoskeletal: She exhibits no edema.  Moves x 4 extremities.Scoiliosis.Unsteady gait uses Rolator and has Wheelchair for long distance.   Lymphadenopathy:    She has no cervical adenopathy.  Neurological: She is oriented to person, place, and time. Coordination normal.  Skin: Skin is warm and dry. No rash noted. No erythema.  Psychiatric:  Anxious     Labs reviewed: Recent Labs    10/14/16 1337 10/17/16 1512 02/20/17 0750  NA 130* 137 140  K 4.0 4.5 4.3  CL 95* 100 103  CO2 26 25 27   GLUCOSE 106* 172* 106*  BUN 21 13 15   CREATININE 0.98* 0.78 0.94*  CALCIUM 8.7  9.6 10.0   Recent Labs    05/23/16 0800 10/14/16 1337 10/17/16 1512 02/20/17 0750  AST 25 38* 25 19  ALT 17 48* 37* 13  ALKPHOS 59 72 61  --   BILITOT 0.4 0.5 0.2 0.4  PROT 7.1 5.6* 6.2 6.9  ALBUMIN 4.2 3.3* 3.6  --    Recent Labs    10/14/16 1337 10/17/16 1512 02/20/17 0750  WBC 19.0* 8.8 6.4  NEUTROABS  --  7,480 4,205  HGB 11.0* 11.8 13.3  HCT 33.7* 36.1 40.1  MCV 91.8 92.3 88.9  PLT 314 426* 275   Lab Results  Component Value Date   TSH  2.90 05/21/2015   No results found for: HGBA1C Lab Results  Component Value Date   CHOL 174 02/20/2017   HDL 63 02/20/2017   LDLCALC 90 05/23/2016   TRIG 123 02/20/2017   CHOLHDL 2.8 02/20/2017    Significant Diagnostic Results in last 30 days:  Mr Lumbar Spine W/o Contrast  Result Date: 04/11/2017 CLINICAL DATA:  Initial evaluation for chronic low back pain extending into the buttocks and right upper length. EXAM: MRI LUMBAR SPINE WITHOUT CONTRAST TECHNIQUE: Multiplanar, multisequence MR imaging of the lumbar spine was performed. No intravenous contrast was administered. COMPARISON:  Prior radiograph from 03/23/2017. FINDINGS: Segmentation: Normal segmentation. Lowest well-formed disc labeled the L5-S1 level. Alignment: Severe dextroscoliosis with apex at L2-3, stable from previous. Trace anterolisthesis of L3 on L4 and L4 on L5. Vertebrae: Vertebral body heights are maintained without evidence for acute or chronic fracture. There is abnormal marrow edema extending through the left sacral ala, consistent with acute insufficiency type fracture (series 8, image 19). Additionally, there is abnormal marrow edema with subtle linear lucency traversing the right iliac wing just lateral to the right SI joint, also consistent with acute insufficiency type fracture (series 100, image 43) marrow edema within the S2 and S3 segments also consistent with insufficiency fractures (series 8, image 12). Signal intensity within the underlying bone  marrow is somewhat heterogeneous but within normal limits. Chronic reactive endplate changes seen at the left aspect of the L1-2 interspace and right aspect of the L5-S1 interspaces due to scoliotic curvature. No other worrisome osseous lesions. Conus medullaris and cauda equina: Conus extends to the L1-2 level. Conus and cauda equina appear normal. Paraspinal and other soft tissues: Soft tissue edema seen surrounding the above described insufficiency type fractures. Chronic atrophy noted within the lower paraspinous musculature. Asymmetric atrophy also noted within the left psoas muscle. Small T2 hyperintense simple cysts noted within left kidney. Visualized visceral structures otherwise unremarkable. Intra-abdominal aorta is tortuous. Disc levels: T11-12: Minimal disc bulge. Right far lateral reactive endplate changes. No stenosis. T12-L1: Chronic intervertebral disc space narrowing with diffuse disc bulge and disc desiccation. Chronic reactive endplate changes with marginal endplate osteophytic spurring. Posterior disc osteophyte indents and partially effaces the ventral thecal sac. Left-sided facet hypertrophy. Resultant mild canal with left subarticular stenosis. Foramina are grossly patent. L1-2: Chronic intervertebral disc space narrowing with diffuse disc bulge and disc desiccation. Left far lateral/ extraforaminal reactive endplate changes. Moderate to advanced left-sided facet arthrosis. No significant canal narrowing. Mild to moderate left lateral recess narrowing/effacement. Mild left foraminal stenosis without impingement. Right neural foramen remains patent. L2-3: Chronic intervertebral disc space narrowing with diffuse disc bulge and disc desiccation. Endplate marginal osteophytic spurring, greater on the left. Moderate left-sided facet hypertrophy. Mild left lateral recess narrowing without significant canal stenosis. No significant foraminal encroachment. L3-4: Trace anterolisthesis. Chronic  intervertebral disc space narrowing with disc desiccation. Severe left with moderate right facet arthrosis. Moderate left subarticular stenosis without significant canal narrowing. Mild to moderate left L3 foraminal stenosis. L4-5: Trace anterolisthesis. Diffuse disc bulge with disc desiccation. Severe bilateral facet arthrosis, slightly worse on the right. Mild canal with moderate bilateral subarticular stenosis, worse on the right. No significant foraminal encroachment. L5-S1: Disc bulge with disc desiccation and intervertebral disc space narrowing. Right far lateral chronic reactive endplate changes. Severe right with moderate left facet arthrosis. Superimposed 6 mm synovial cyst at the anterior margin of the right L5-S1 facet (series 6, image 9). Reactive effusion present within the left L5-S1 facet. Mild to  moderate right L5 foraminal stenosis related disc osteophyte and facet disease. No significant left foraminal encroachment. Central canal and lateral recesses are widely patent. IMPRESSION: 1. Acute insufficiency type fractures involving the left sacral ala, right iliac wing, as well as the S2 and S3 segments. 2. Severe dextroscoliosis with apex at L2-3. Associated chronic multilevel degenerative spondylolysis and facet arthrosis as above. Resultant mild-to-moderate lateral recess narrowing at L1-2 thru L4-5 as above, overall worse on the left. No significant central canal stenosis or evidence for neural impingement. 3. Moderate right L5 foraminal stenosis related to disc osteophyte and facet disease. Electronically Signed   By: Jeannine Boga M.D.   On: 04/11/2017 13:56   Assessment/Plan   Urinary tract infection symptoms Afebrile.Has had increased urine incontinence and frequency.Very strong urine odor reported by Nurse.Encourage fluid intake.Obtain urine specimen for U/a and C/S rule out UTI  Anxiety  Has improved per nurse. Continue on clonazepam 0.5 mg tablet as directed. Has upcoming  appointment 04/17/2017 with her Psychiatrist. Continue to monitor.   Depression Stable.Effexor GDR completed today.Continue on Lexapro 20 mg tablet daily and Bupropion 450 mg tablet daily.follow up with Psychiatry as above.   Addendum 04/17/2017:  Urine analysis results showed cloudy colored urine,Nitrite positive, 3+ leukocyte estrace with many bacteria. Culture and sensitivity results showed greater than 100,000 colonies of Klebsiella ozaenae. Start on Nitrofurantoin 100 mg capsule one by mouth twice daily x 7 days. florastor 250 mg capsule one by mouth twice daily x 10 days.   Family/ staff Communication: Reviewed plan of care with patient and facility Nurse.   Labs/tests ordered: None   Dinah C Ngetich, NP

## 2017-04-17 DIAGNOSIS — F333 Major depressive disorder, recurrent, severe with psychotic symptoms: Secondary | ICD-10-CM | POA: Diagnosis not present

## 2017-04-17 DIAGNOSIS — R29898 Other symptoms and signs involving the musculoskeletal system: Secondary | ICD-10-CM | POA: Diagnosis not present

## 2017-04-17 DIAGNOSIS — M6281 Muscle weakness (generalized): Secondary | ICD-10-CM | POA: Diagnosis not present

## 2017-04-17 DIAGNOSIS — R2681 Unsteadiness on feet: Secondary | ICD-10-CM | POA: Diagnosis not present

## 2017-04-19 DIAGNOSIS — R29898 Other symptoms and signs involving the musculoskeletal system: Secondary | ICD-10-CM | POA: Diagnosis not present

## 2017-04-19 DIAGNOSIS — R2681 Unsteadiness on feet: Secondary | ICD-10-CM | POA: Diagnosis not present

## 2017-04-19 DIAGNOSIS — M6281 Muscle weakness (generalized): Secondary | ICD-10-CM | POA: Diagnosis not present

## 2017-04-20 DIAGNOSIS — R2681 Unsteadiness on feet: Secondary | ICD-10-CM | POA: Diagnosis not present

## 2017-04-20 DIAGNOSIS — R29898 Other symptoms and signs involving the musculoskeletal system: Secondary | ICD-10-CM | POA: Diagnosis not present

## 2017-04-20 DIAGNOSIS — M6281 Muscle weakness (generalized): Secondary | ICD-10-CM | POA: Diagnosis not present

## 2017-04-21 ENCOUNTER — Non-Acute Institutional Stay: Payer: Medicare Other

## 2017-04-21 DIAGNOSIS — Z0000 Encounter for general adult medical examination without abnormal findings: Secondary | ICD-10-CM | POA: Diagnosis not present

## 2017-04-21 DIAGNOSIS — R29898 Other symptoms and signs involving the musculoskeletal system: Secondary | ICD-10-CM | POA: Diagnosis not present

## 2017-04-21 DIAGNOSIS — M6281 Muscle weakness (generalized): Secondary | ICD-10-CM | POA: Diagnosis not present

## 2017-04-21 DIAGNOSIS — R2681 Unsteadiness on feet: Secondary | ICD-10-CM | POA: Diagnosis not present

## 2017-04-21 NOTE — Progress Notes (Signed)
Subjective:   Tina Hampton is a 83 y.o. female who presents for Medicare Annual (Subsequent) preventive examination at [place] [facility]-07/16/2015       Objective:     Vitals: BP 132/64 (BP Location: Left Arm, Patient Position: Sitting)   Pulse 84   Temp (!) 97.4 F (36.3 C) (Oral)   Ht 5\' 2"  (1.575 m)   Wt 136 lb (61.7 kg)   SpO2 94%   BMI 24.87 kg/m   Body mass index is 24.87 kg/m.  Advanced Directives 04/21/2017 04/14/2017 04/05/2017 10/25/2016 10/17/2016 10/14/2016 10/13/2016  Does Patient Have a Medical Advance Directive? Yes Yes Yes Yes Yes Yes Yes  Type of Paramedic of [place];Living will Living will;Healthcare Power of [place];Out of facility DNR (pink MOST or yellow form) [place];Living will;Out of facility DNR (pink MOST or yellow form) [place];Living will;Out of facility DNR (pink MOST or yellow form) [place];Living will;Out of facility DNR (pink MOST or yellow form) [place];Living will;Out of facility DNR (pink MOST or yellow form) [place];Living will;Out of facility DNR (pink MOST or yellow form)  Does patient want to make changes to medical advance directive? No - Patient declined - No - Patient declined - - No - Patient declined -  Copy of [place] in Chart? Yes Yes Yes Yes Yes Yes Yes  Pre-existing out of facility DNR order (yellow form or pink MOST form) - Pink MOST form placed in chart (order not valid for inpatient use) Pink MOST form placed in chart (order not valid for inpatient use) Pink MOST form placed in chart (order not valid for inpatient use) Pink MOST form placed in chart (order not valid for inpatient use) - Pink MOST form placed in chart (order not valid for inpatient use)    Tobacco Social History   Tobacco Use  Smoking Status Never Smoker  Smokeless Tobacco Never Used       Counseling given: Not Answered   Clinical Intake:  Pre-visit preparation completed: No  Pain : 0-10 Pain Score: 5  Pain Type: Chronic pain Pain Location: Back Pain Orientation: Lower Pain Descriptors / Indicators: Aching Pain Onset: More than a month ago Pain Frequency: Constant     Nutritional Risks: None Diabetes: No  Activities of Daily Living: Independent Ambulation: Independent with device- listed below Home Assistive Devices/Equipment: Wheelchair, Eyeglasses, Environmental consultant (specify Type), Communication device (specify type)(bilateral hearing aids) Medication Administration: Independent Home Management: Independent  Barriers to Care Management & Learning: Mental health  Do you feel unsafe in your current relationship?: No Do you feel physically threatened by others?: No Anyone hurting you at home, work, or school?: No Unable to ask?: No Information provided on Community resources: No  How often do you need to have someone help you when you read instructions, pamphlets, or other written materials from your doctor or pharmacy?: 1 - Never  Interpreter Needed?: No  Information entered by :: Rich Reining, RN  Past Medical History:  Diagnosis Date  . Abnormal mammogram, unspecified 04/19/2011  . Allergy   . Anxiety   . Backache, unspecified 01/21/2005  . Cervicalgia 08/20/2013  . Chronic kidney disease, stage II (mild) 04/03/2009  . Chronic rhinitis 09/28/2010  . DDD (degenerative disc disease)   . Depression   . Dermatophytosis of nail 01/21/2005  . Diverticulosis of colon (without mention of hemorrhage) 06/17/2005  . Edema 11/26/2004  .  GERD (gastroesophageal reflux disease)   . Hiatal hernia   . High cholesterol   . Hyperglycemia 11/18/2014  . Hypertension   . Insomnia, unspecified 10/18/2011  . Left inguinal hernia 04/24/2014  . Nocturia 09/19/2008  . Osteoporosis, senile 11/18/2014  . Other abnormal blood chemistry 04/03/2007  . Pain in joint, pelvic region and thigh  11/12/2011  . Pain in joint, shoulder region 08/15/2006  . Pain in limb 08/26/2005  . Palpitations 09/29/2009  . PAOD (peripheral arterial occlusive disease) ([place]) 11/18/2014   Right leg; diminished popliteal, dorsalis pedis, and posterior tibial artery pulsations   . Postmenopausal atrophic vaginitis 12/14/2004  . Scoliosis   . Senile osteoporosis 12/14/2004  . Tension headache 10/04/2005  . Unspecified cataract 11/26/2004  . Unspecified constipation 01/21/2005  . Unspecified urinary incontinence 11/26/2004  . Vaginal stenosis 11/18/2014   Tight band about 1/2 inches into the vagina which will not allow passage of my index finger.    Past Surgical History:  Procedure Laterality Date  . ABDOMINAL HYSTERECTOMY  1969   Dr.Braun   . bilateral eyelid surgery  2005   Dr.Scott  . BILATERAL OOPHORECTOMY  2002  . BREAST SURGERY     benign tumor removal, left breast 1969  . CATARACT EXTRACTION     Right  . CHOLECYSTECTOMY  1990   Dr.Moore   . COLONOSCOPY  3/18/20008   inflammation, diverticular associated colitis -Dr. Ardis Hughs  . COSMETIC SURGERY    . ESOPHAGOGASTRODUODENOSCOPY N/A 01/24/2014   Procedure: ESOPHAGOGASTRODUODENOSCOPY (EGD);  Surgeon: Irene Shipper, MD;  [facility] ENDOSCOPY;  Service: Endoscopy;  Laterality: N/A;  . ESOPHAGOGASTRODUODENOSCOPY ENDOSCOPY  04/04/14   Dr. Ardis Hughs  . EYE SURGERY    . FOREIGN BODY REMOVAL N/A 01/24/2014   Procedure: FOREIGN BODY REMOVAL;  Surgeon: Irene Shipper, MD;  Location: [place];  Service: Endoscopy;  Laterality: N/A;  . HIP PINNING,CANNULATED  11/07/2011   Procedure: CANNULATED HIP PINNING;  Surgeon: Jessy Oto, MD;  Location: [facility];  Service: Orthopedics;  Laterality: Left;  . ORIF FEMORAL NECK FRACTURE W/ [facility]  11/07/2011   Dr.Nitka   . TONSILLECTOMY     Dr.Joe Tamala Julian  . TUBAL LIGATION    . VAGINAL PROLAPSE REPAIR  2002   Dr.Horback    Family History  Problem Relation Age of Onset  . Heart disease Mother   . Stroke Father   . Heart  disease Brother   . Esophageal cancer Neg Hx   . Stomach cancer Neg Hx   . Colon cancer Neg Hx    Social History   Socioeconomic History  . Marital status: Widowed    Spouse name: None  . Number of children: None  . Years of education: None  . Highest education level: None  Social Needs  . Financial resource strain: Not hard at all  . Food insecurity - worry: Never true  . Food insecurity - inability: Never true  . Transportation needs - medical: No  . Transportation needs - non-medical: No  Occupational History  . 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 Smoking status: Never Smoker  . Smokeless tobacco: Never Used  Substance and Sexual Activity  . Alcohol use: Yes    Alcohol/week: 4.2 oz    Types: 7 Glasses of wine per week    Comment: one glass of wine in evening  . Drug use: No  . Sexual activity: No  Other Topics Concern  . None  Social  History Narrative   Lives at [facility] since 04/14/2005   Widowed (husband expired 2014)   Has Living Will, POA, MOST   Exercise: water aerobic  5 times a week   Walks with walker   Never smoked   Drinks moderate amount of caffeinate beverages daily   Alcohol none       Outpatient Encounter Medications as of 04/21/2017  Medication Sig  . acetaminophen-codeine (TYLENOL #3) 300-30 MG tablet Take 1 tablet by mouth every 6 (six) hours as needed for moderate pain.  Marland Kitchen antiseptic oral rinse (BIOTENE) LIQD 15 mLs by Mouth Rinse route every 4 (four) hours as needed for dry mouth.   Marland Kitchen aspirin 81 MG tablet Take 81 mg by mouth daily.  Marland Kitchen buPROPion (WELLBUTRIN XL) 150 MG 24 hr tablet Take 450 mg by mouth daily.  . Calcium Carb-Cholecalciferol (CALTRATE 600+D) 600-800 MG-UNIT TABS Take 1 tablet by mouth 2 (two) times daily.  . chlorpheniramine-HYDROcodone (TUSSIONEX PENNKINETIC ER) 10-8 MG/5ML SUER Take 5 mLs by mouth every 12 (twelve) hours as needed for cough.   . cholecalciferol (VITAMIN D) 1000 UNITS  tablet Take 1,000 Units by mouth daily.  . clonazePAM (KLONOPIN) 0.5 MG tablet Take 0.25 mg by mouth 2 (two) times daily. 8 am and 2 pm.  . clonazePAM (KLONOPIN) 0.5 MG tablet Take 0.25 mg by mouth daily as needed for anxiety (in addition to the scheduled dose).  Marland Kitchen escitalopram (LEXAPRO) 20 MG tablet Take 20 mg by mouth daily.  . Glucosamine HCl 1000 MG TABS Take 2,000 mg by mouth 2 (two) times daily.  Marland Kitchen ibandronate (BONIVA) 150 MG tablet Take 1 tablet (150 mg total) by mouth every 30 (thirty) days. Take in the morning with a full glass of water, on an empty stomach, and do not take anything else by mouth or lie down for the next 30 min.  . meloxicam (MOBIC) 7.5 MG tablet TAKE 1 TABLET BY MOUTH EVERY DAY AS NEEDED FOR ARTHRITIS  . Menthol, Topical Analgesic, (BIOFREEZE EX) Apply 1 application topically daily as needed.   . Multiple Vitamin (MULTIVITAMIN WITH MINERALS) TABS Take 1 tablet by mouth daily.  Marland Kitchen NIFEdipine (PROCARDIA XL/ADALAT-CC) 60 MG 24 hr tablet TAKE 1 TABLET BY MOUTH EVERY DAY TO CONTROL BLOOD PRESSURE  . pantoprazole (PROTONIX) 40 MG tablet One daily to reduce stomach acid  . Polyethyl Glycol-Propyl Glycol (SYSTANE) 0.4-0.3 % SOLN Place 1 drop into both eyes 2 (two) times daily.   . pravastatin (PRAVACHOL) 10 MG tablet Take 10 mg by mouth daily.  . [DISCONTINUED] Venlafaxine HCl (EFFEXOR PO) Take 37.5 mg by mouth daily with breakfast. Stop date 04/19/17   No facility-administered encounter medications on file as of 04/21/2017.     Activities of Daily Living In your present state of health, do you have any difficulty performing the following activities: 04/21/2017  Hearing? N  Vision? N  Difficulty concentrating or making decisions? Y  Walking or climbing stairs? Y  Dressing or bathing? N  Doing errands, shopping? N  Preparing Food and eating ? N  Using the Toilet? N  In the past six months, have you accidently leaked urine? Y  Do you have problems with loss of bowel control?  N  Managing your Medications? N  Managing your Finances? N  Housekeeping or managing your Housekeeping? N  Some recent data might be hidden    Timed Get Up and Go performed: 15 seconds, fall risk  Patient Care Team: Blanchie Serve, MD as PCP -  General (Internal Medicine) Melina Modena, [facility] Clent Jacks, MD as Consulting Physician (Ophthalmology) Milus Banister, MD as Consulting Physician (Gastroenterology) Norma Fredrickson, MD as Consulting Physician (Psychiatry) Jessy Oto, MD as Consulting Physician (Orthopedic Surgery) Bjorn Loser, MD as Consulting Physician (Urology)    Assessment:     Exercise Activities and Dietary recommendations Current Exercise Habits: The patient does not participate in regular exercise at present, Exercise limited by: psychological condition(s);orthopedic condition(s)  Goals    None     Fall Risk Fall Risk  04/21/2017 10/17/2016 10/14/2016 10/13/2016 05/31/2016  Falls in the past year? No No No No No   Is the patient's home free of loose throw rugs in walkways, pet beds, electrical cords, etc?   yes      Grab bars in the bathroom? yes      Handrails on the stairs?   yes      Adequate lighting?   yes  Depression Screen PHQ 2/9 Scores 04/21/2017 05/31/2016 10/20/2015 09/01/2015  PHQ - 2 Score 6 0 1 0  PHQ- 9 Score 15 - - -     Cognitive Function: within normal limits MMSE - Mini Mental State Exam 05/31/2016 11/18/2014  Orientation to time 5 5  Orientation to Place 5 5  Registration 3 3  Attention/ Calculation 5 5  Recall 3 3  Language- name 2 objects 2 2  Language- repeat 1 1  Language- follow 3 step command 3 3  Language- read & follow direction 1 1  Write a sentence 1 1  Copy design 1 1  Total score 30 30        Immunization History  Administered Date(s) Administered  . Influenza Whole 02/14/2012, 02/14/2013  . Influenza-Unspecified 02/27/2014, 02/12/2015, 02/25/2016, 03/15/2017  . Pneumococcal Conjugate-13 07/13/2015  .  Pneumococcal Polysaccharide-23 05/16/1998  . Td 05/16/2004  . Zoster 05/16/2005   Screening Tests Health Maintenance  Topic Date Due  . TETANUS/TDAP  05/16/2018 (Originally 05/16/2014)  . MAMMOGRAM  12/20/2017  . INFLUENZA VACCINE  Completed  . DEXA SCAN  Completed  . PNA vac Low Risk Adult  Completed   Cancer Screenings: Lung:  Low Dose CT Chest recommended if Age 60-80 years, 30 pack-year currently smoking OR have quit w/in 15years. Patient does not qualify. Breast:  Up to date on Mammogram? Yes  Up to date of Bone Density/Dexa? Yes Colorectal: up to date, pt is over age 77  Additional Screenings:  Hepatitis B/HIV/Syphillis:Declined Hepatitis C Screening: Declined     Plan:    I have personally reviewed and addressed the Medicare Annual Wellness questionnaire and have noted the following in the patient's chart:  A. Medical and social history B. Use of alcohol, tobacco or illicit drugs  C. Current medications and supplements D. Functional ability and status E.  Nutritional status F.  Physical activity G. Advance directives H. List of other physicians I.  Hospitalizations, surgeries, and ER visits in previous 12 months J.  [place] to include hearing, vision, cognitive, depression L. Referrals and appointments - none  In addition, I have reviewed and discussed with patient certain preventive protocols, quality metrics, and best practice recommendations. A written personalized care plan for preventive services as well as general preventive health recommendations were provided to patient.  See attached scanned questionnaire for additional information.   Signed,   Rich Reining, RN Nurse Health Advisor   Quick Notes   Health Maintenance: TDAP due, declined. Shingrix declined for now.     Abnormal Screen:  MMSE 30/30 on 05/31/2016     Patient Concerns: Depression     Nurse Concerns: None

## 2017-04-25 DIAGNOSIS — M6281 Muscle weakness (generalized): Secondary | ICD-10-CM | POA: Diagnosis not present

## 2017-04-25 DIAGNOSIS — R2681 Unsteadiness on feet: Secondary | ICD-10-CM | POA: Diagnosis not present

## 2017-04-25 DIAGNOSIS — R29898 Other symptoms and signs involving the musculoskeletal system: Secondary | ICD-10-CM | POA: Diagnosis not present

## 2017-04-26 DIAGNOSIS — R29898 Other symptoms and signs involving the musculoskeletal system: Secondary | ICD-10-CM | POA: Diagnosis not present

## 2017-04-26 DIAGNOSIS — M6281 Muscle weakness (generalized): Secondary | ICD-10-CM | POA: Diagnosis not present

## 2017-04-26 DIAGNOSIS — R2681 Unsteadiness on feet: Secondary | ICD-10-CM | POA: Diagnosis not present

## 2017-04-27 ENCOUNTER — Encounter (INDEPENDENT_AMBULATORY_CARE_PROVIDER_SITE_OTHER): Payer: Self-pay | Admitting: Specialist

## 2017-04-27 ENCOUNTER — Ambulatory Visit (INDEPENDENT_AMBULATORY_CARE_PROVIDER_SITE_OTHER): Payer: Medicare Other | Admitting: Specialist

## 2017-04-27 VITALS — BP 156/93 | HR 89 | Ht 63.0 in | Wt 136.0 lb

## 2017-04-27 DIAGNOSIS — M8448XA Pathological fracture, other site, initial encounter for fracture: Secondary | ICD-10-CM

## 2017-04-27 DIAGNOSIS — F411 Generalized anxiety disorder: Secondary | ICD-10-CM | POA: Diagnosis not present

## 2017-04-27 DIAGNOSIS — M8000XA Age-related osteoporosis with current pathological fracture, unspecified site, initial encounter for fracture: Secondary | ICD-10-CM | POA: Diagnosis not present

## 2017-04-27 DIAGNOSIS — M4125 Other idiopathic scoliosis, thoracolumbar region: Secondary | ICD-10-CM | POA: Diagnosis not present

## 2017-04-27 DIAGNOSIS — M48062 Spinal stenosis, lumbar region with neurogenic claudication: Secondary | ICD-10-CM | POA: Diagnosis not present

## 2017-04-27 NOTE — Progress Notes (Signed)
Office Visit Note   Patient: Tina Hampton           Date of Birth: July 11, 1925           MRN: [id number] Visit Date: 04/27/2017              Requested by: Blanchie Serve, MD [address] [place], [place] [address] PCP: Blanchie Serve, MD   Assessment & Plan: Visit Diagnoses:  1. Sacral insufficiency fracture, initial encounter   2. Osteoporosis with current pathological fracture, unspecified osteoporosis type, initial encounter   3. Other idiopathic scoliosis, thoracolumbar region   4. Spinal stenosis of lumbar region with neurogenic claudication     Plan: Avoid frequent bending and stooping  No lifting greater than 10 lbs. May use ice or moist heat for pain. Weight loss is of benefit. Take the caltrate with Vitamin D, twice a day. Drink milk 1-2 glasses per day. Start boniva 150 mg one time per day. Referral to see Dr. Estanislado Pandy, Rheumatology that specializes in treating osteoporosis. She will review your situation and determine if a change in bone building agents is necessary as you have new fractures due to weakened bone from osteoporosisDecrease your activity level by 50%, walking and exercises too to allow the stressed areas to heal.  Take baby ASA once a day.  Follow-Up Instructions: Return in about 4 weeks (around 05/25/2017).   Orders:  Orders Placed This Encounter  Procedures  . Ambulatory referral to Rheumatology   No orders of the defined types were placed in this encounter.     Procedures: No procedures performed   Clinical Data: No additional findings.   Subjective: Chief Complaint  Patient presents with  . Lower Back - Follow-up    MRI Review-lumbar    83 year old female with a history of lumbodorsal scoliosis and previous lumbar compression fracture. Has been treated with pinning of the left hip for femoral neck fracture. She has Been experiencing pain in her bilateral buttocks and sacral areas with prolong standing and walking. Previous  use of boniva started in  03/2013 Due to worsening pains with worsening Walking and standing tolerance she underwent MRI of the lumbar spine and pelvis. This was done 11/27 and shows bilateral sacral insufficiency fractures.     Review of Systems  Constitutional: Negative.   HENT: Negative.   Eyes: Negative.   Respiratory: Negative.   Cardiovascular: Negative.   Gastrointestinal: Negative.   Endocrine: Negative.   Genitourinary: Negative.   Musculoskeletal: Negative.   Skin: Negative.   Allergic/Immunologic: Negative.   Neurological: Negative.   Hematological: Negative.   Psychiatric/Behavioral: Negative.      Objective: Vital Signs: BP (!) 156/93 (BP Location: Left Arm, Patient Position: Sitting)   Pulse 89   Ht 5\' 3"  (1.6 m)   Wt 136 lb (61.7 kg)   BMI 24.09 kg/m   Physical Exam  Constitutional: She is oriented to person, place, and time. She appears well-developed and well-nourished.  HENT:  Head: Normocephalic and atraumatic.  Eyes: EOM are normal. Pupils are equal, round, and reactive to light.  Neck: Normal range of motion. Neck supple.  Pulmonary/Chest: Effort normal and breath sounds normal.  Abdominal: Soft. Bowel sounds are normal.  Neurological: She is alert and oriented to person, place, and time.  Skin: Skin is warm and dry.  Psychiatric: She has a normal mood and affect. Her behavior is normal. Judgment and thought content normal.    Right Hip Exam  Right hip exam is  normal.   Tenderness  The patient is experiencing no tenderness.   Range of Motion  Abduction: normal  Adduction: normal  Extension: normal  Flexion: normal  External rotation: normal  Internal rotation: normal    Left Hip Exam  Left hip exam is normal.  Range of Motion  Abduction: normal  Adduction: normal  Extension: normal  Flexion: normal  External rotation: normal  Internal rotation: normal    Back Exam   Tenderness  The patient is experiencing tenderness in the  lumbar.  Range of Motion  Extension: abnormal  Flexion: abnormal  Lateral bend right: abnormal  Lateral bend left: abnormal  Rotation right: abnormal  Rotation left: abnormal   Muscle Strength  Right Quadriceps:  5/5  Left Quadriceps:  5/5  Right Hamstrings:  5/5  Left Hamstrings:  5/5   Tests  Straight leg raise right: negative Straight leg raise left: negative  Reflexes  Patellar: normal Achilles: normal Babinski's sign: normal   Other  Toe walk: normal Heel walk: normal Sensation: normal Gait: normal  Erythema: no back redness Scars: absent  Comments:  Motor in the legs is normal. SLR is negative.  Tender bilateral ischial tuberosities, and L-S junction.      Specialty Comments:  No specialty comments available.  Imaging: No results found.   PMFS History: Patient Active Problem List   Diagnosis Date Noted  . Spinal stenosis of lumbar region with neurogenic claudication 03/01/2017  . Major depression, recurrent, chronic ([place]) 03/01/2017  . Primary osteoarthritis involving multiple joints 03/01/2017  . OAB (overactive bladder) 03/01/2017  . Rash 10/25/2016  . Hearing loss 10/25/2016  . Chronic bilateral low back pain without sciatica 08/17/2016  . Pain in joint, shoulder region 05/31/2016  . Sprain of anterior talofibular ligament 04/27/2016  . Pain of left scapula 10/20/2015  . Tremor 06/02/2015  . Vaginal stenosis 11/18/2014  . PAOD (peripheral arterial occlusive disease) ([place]) 11/18/2014  . Age-related osteoporosis without current pathological fracture 11/18/2014  . Hyperglycemia 11/18/2014  . Left inguinal hernia 04/24/2014  . Diverticulosis of colon with hemorrhage 04/24/2014  . Aortic ejection murmur 04/24/2014  . Dysphagia 01/24/2014  . Esophageal stricture 01/24/2014  . Hip fracture ([place]) 11/07/2011  . GERD (gastroesophageal reflux disease)   . Hypertension   . Hyperlipidemia LDL goal <100   . Hiatal hernia   . Scoliosis   . DDD  (degenerative disc disease)   . Depression   . Senile osteoporosis 12/14/2004  . Postmenopausal atrophic vaginitis 12/14/2004  . Edema 11/26/2004  . Urinary incontinence 11/26/2004   Past Medical History:  Diagnosis Date  . Abnormal mammogram, unspecified 04/19/2011  . Allergy   . Anxiety   . Backache, unspecified 01/21/2005  . Cervicalgia 08/20/2013  . Chronic kidney disease, stage II (mild) 04/03/2009  . Chronic rhinitis 09/28/2010  . DDD (degenerative disc disease)   . Depression   . Dermatophytosis of nail 01/21/2005  . Diverticulosis of colon (without mention of hemorrhage) 06/17/2005  . Edema 11/26/2004  . GERD (gastroesophageal reflux disease)   . Hiatal hernia   . High cholesterol   . Hyperglycemia 11/18/2014  . Hypertension   . Insomnia, unspecified 10/18/2011  . Left inguinal hernia 04/24/2014  . Nocturia 09/19/2008  . Osteoporosis, senile 11/18/2014  . Other abnormal blood chemistry 04/03/2007  . Pain in joint, pelvic region and thigh 11/12/2011  . Pain in joint, shoulder region 08/15/2006  . Pain in limb 08/26/2005  . Palpitations 09/29/2009  . PAOD (peripheral arterial occlusive disease) (  [place]) 11/18/2014   Right leg; diminished popliteal, dorsalis pedis, and posterior tibial artery pulsations   . Postmenopausal atrophic vaginitis 12/14/2004  . Scoliosis   . Senile osteoporosis 12/14/2004  . Tension headache 10/04/2005  . Unspecified cataract 11/26/2004  . Unspecified constipation 01/21/2005  . Unspecified urinary incontinence 11/26/2004  . Vaginal stenosis 11/18/2014   Tight band about 1/2 inches into the vagina which will not allow passage of my index finger.     Family History  Problem Relation Age of Onset  . Heart disease Mother   . Stroke Father   . Heart disease Brother   . Esophageal cancer Neg Hx   . Stomach cancer Neg Hx   . Colon cancer Neg Hx     Past Surgical History:  Procedure Laterality Date  . ABDOMINAL HYSTERECTOMY  1969   Dr.Braun   . bilateral eyelid surgery   2005   Dr.Scott  . BILATERAL OOPHORECTOMY  2002  . BREAST SURGERY     benign tumor removal, left breast 1969  . CATARACT EXTRACTION     Right  . CHOLECYSTECTOMY  1990   Dr.Moore   . COLONOSCOPY  3/18/20008   inflammation, diverticular associated colitis -Dr. Ardis Hughs  . COSMETIC SURGERY    . ESOPHAGOGASTRODUODENOSCOPY N/A 01/24/2014   Procedure: ESOPHAGOGASTRODUODENOSCOPY (EGD);  Surgeon: Irene Shipper, MD;  Location[facility] ENDOSCOPY;  Service: Endoscopy;  Laterality: N/A;  . ESOPHAGOGASTRODUODENOSCOPY ENDOSCOPY  04/04/14   Dr. Ardis Hughs  . EYE SURGERY    . FOREIGN BODY REMOVAL N/A 01/24/2014   Procedure: FOREIGN BODY REMOVAL;  Surgeon: Irene Shipper, MD;  Location: [place];  Service: Endoscopy;  Laterality: N/A;  . HIP PINNING,CANNULATED  11/07/2011   Procedure: CANNULATED HIP PINNING;  Surgeon: Jessy Oto, MD;  [facility];  Service: Orthopedics;  Laterality: Left;  . ORIF FEMORAL NECK FRACTURE W/ [facility]  11/07/2011   Dr.Nitka   . TONSILLECTOMY     Dr.Joe Tamala Julian  . TUBAL LIGATION    . VAGINAL PROLAPSE REPAIR  2002   Dr.Horback    Social History   Occupational History  . 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 Smoking status: Never Smoker  . Smokeless tobacco: Never Used  Substance and Sexual Activity  . Alcohol use: Yes    Alcohol/week: 4.2 oz    Types: 7 Glasses of wine per week    Comment: one glass of wine in evening  . Drug use: No  . Sexual activity: No

## 2017-04-27 NOTE — Patient Instructions (Addendum)
Avoid frequent bending and stooping  No lifting greater than 10 lbs. May use ice or moist heat for pain. Weight loss is of benefit. Take the caltrate with Vitamin D, twice a day. Drink milk 1-2 glasses per day. Start boniva 150 mg one time per day. Referral to see Dr. Estanislado Pandy, Rheumatology that specializes in treating osteoporosis. She will review your situation and determine if a change in bone building agents is necessary as you have new fractures due to weakened bone from osteoporosis. Decrease your activity level by 50%, walking and exercises too to allow the stressed areas to heal.  Take baby ASA once a day.

## 2017-04-28 DIAGNOSIS — M6281 Muscle weakness (generalized): Secondary | ICD-10-CM | POA: Diagnosis not present

## 2017-04-28 DIAGNOSIS — R29898 Other symptoms and signs involving the musculoskeletal system: Secondary | ICD-10-CM | POA: Diagnosis not present

## 2017-04-28 DIAGNOSIS — R2681 Unsteadiness on feet: Secondary | ICD-10-CM | POA: Diagnosis not present

## 2017-04-29 DIAGNOSIS — M6281 Muscle weakness (generalized): Secondary | ICD-10-CM | POA: Diagnosis not present

## 2017-04-29 DIAGNOSIS — R2681 Unsteadiness on feet: Secondary | ICD-10-CM | POA: Diagnosis not present

## 2017-04-29 DIAGNOSIS — R29898 Other symptoms and signs involving the musculoskeletal system: Secondary | ICD-10-CM | POA: Diagnosis not present

## 2017-05-01 ENCOUNTER — Telehealth: Payer: Self-pay

## 2017-05-01 NOTE — Telephone Encounter (Signed)
Patient called down to the clinic area with concerns about her Wellbutrin. She stated that she doesn't feel like its working properly. She still feels depressed and wants her medication to changed to something else. She mentioned that her Psychiatrist is aware and wants her continue taking medication as prescribed til her appointment on January 4th. Patient is currently taking Wellbutrin 450 mg daily. She resides in assisted living right now.

## 2017-05-01 NOTE — Telephone Encounter (Signed)
I will notify the nurse

## 2017-05-03 ENCOUNTER — Encounter: Payer: Self-pay | Admitting: Internal Medicine

## 2017-05-03 ENCOUNTER — Non-Acute Institutional Stay: Payer: Medicare Other | Admitting: Internal Medicine

## 2017-05-03 DIAGNOSIS — F411 Generalized anxiety disorder: Secondary | ICD-10-CM | POA: Diagnosis not present

## 2017-05-03 DIAGNOSIS — F332 Major depressive disorder, recurrent severe without psychotic features: Secondary | ICD-10-CM | POA: Diagnosis not present

## 2017-05-03 DIAGNOSIS — M6281 Muscle weakness (generalized): Secondary | ICD-10-CM | POA: Diagnosis not present

## 2017-05-03 DIAGNOSIS — R29898 Other symptoms and signs involving the musculoskeletal system: Secondary | ICD-10-CM | POA: Diagnosis not present

## 2017-05-03 DIAGNOSIS — R2681 Unsteadiness on feet: Secondary | ICD-10-CM | POA: Diagnosis not present

## 2017-05-03 NOTE — Progress Notes (Signed)
Location:  [facility] Room Number: 31 Place of Service:  ALF [phone]) Provider:  Blanchie Serve, MD  Blanchie Serve, MD  Patient Care Team: Blanchie Serve, MD as PCP - General (Internal Medicine) Melina Modena, [facility] Clent Jacks, MD as Consulting Physician (Ophthalmology) Milus Banister, MD as Consulting Physician (Gastroenterology) Norma Fredrickson, MD as Consulting Physician (Psychiatry) Jessy Oto, MD as Consulting Physician (Orthopedic Surgery) Bjorn Loser, MD as Consulting Physician (Urology)  Extended Emergency Contact Information Primary Emergency Contact: [address], [address] [place], [place] [address] Johnnette Litter of [place] Phone: [phone] Mobile Phone: [phone] Relation: Son  Goals of care: Advanced Directive information Advanced Directives 05/03/2017  Does Patient Have a Medical Advance Directive? Yes  Type of Paramedic of [place];Living will  Does patient want to make changes to medical advance directive? No - Patient declined  Copy of [place] in Chart? -  Pre-existing out of facility DNR order (yellow form or pink MOST form) Pink MOST form placed in chart (order not valid for inpatient use)     Chief Complaint  Patient presents with  . Acute Visit    Medication concerns    HPI:  Pt is a 83 y.o. female seen today for an acute visit for worsening mood. She feels depressed and does not feel current medication is helping her. She has upcoming appointment with her psychiatrist in first week of January. She denies any thoughts about hurting herself or others. She feels low with holiday season and not having her family here. She feels that she lacks energy to do anything. She does not enjoy reading that she did before. She feels helpless. She is tearful and angry this visit. He is seen in her room with nursing supervisor present.    Past Medical History:  Diagnosis Date  .  Abnormal mammogram, unspecified 04/19/2011  . Allergy   . Anxiety   . Backache, unspecified 01/21/2005  . Cervicalgia 08/20/2013  . Chronic kidney disease, stage II (mild) 04/03/2009  . Chronic rhinitis 09/28/2010  . DDD (degenerative disc disease)   . Depression   . Dermatophytosis of nail 01/21/2005  . Diverticulosis of colon (without mention of hemorrhage) 06/17/2005  . Edema 11/26/2004  . GERD (gastroesophageal reflux disease)   . Hiatal hernia   . High cholesterol   . Hyperglycemia 11/18/2014  . Hypertension   . Insomnia, unspecified 10/18/2011  . Left inguinal hernia 04/24/2014  . Nocturia 09/19/2008  . Osteoporosis, senile 11/18/2014  . Other abnormal blood chemistry 04/03/2007  . Pain in joint, pelvic region and thigh 11/12/2011  . Pain in joint, shoulder region 08/15/2006  . Pain in limb 08/26/2005  . Palpitations 09/29/2009  . PAOD (peripheral arterial occlusive disease) ([place]) 11/18/2014   Right leg; diminished popliteal, dorsalis pedis, and posterior tibial artery pulsations   . Postmenopausal atrophic vaginitis 12/14/2004  . Scoliosis   . Senile osteoporosis 12/14/2004  . Tension headache 10/04/2005  . Unspecified cataract 11/26/2004  . Unspecified constipation 01/21/2005  . Unspecified urinary incontinence 11/26/2004  . Vaginal stenosis 11/18/2014   Tight band about 1/2 inches into the vagina which will not allow passage of my index finger.    Past Surgical History:  Procedure Laterality Date  . ABDOMINAL HYSTERECTOMY  1969   Dr.Braun   . bilateral eyelid surgery  2005   Dr.Scott  . BILATERAL OOPHORECTOMY  2002  . BREAST SURGERY     benign  tumor removal, left breast 1969  . CATARACT EXTRACTION     Right  . CHOLECYSTECTOMY  1990   Dr.Moore   . COLONOSCOPY  3/18/20008   inflammation, diverticular associated colitis -Dr. Ardis Hughs  . COSMETIC SURGERY    . ESOPHAGOGASTRODUODENOSCOPY N/A 01/24/2014   Procedure: ESOPHAGOGASTRODUODENOSCOPY (EGD);  Surgeon: Irene Shipper, MD;  Location: [facility]  ENDOSCOPY;  Service: Endoscopy;  Laterality: N/A;  . ESOPHAGOGASTRODUODENOSCOPY ENDOSCOPY  04/04/14   Dr. Ardis Hughs  . EYE SURGERY    . FOREIGN BODY REMOVAL N/A 01/24/2014   Procedure: FOREIGN BODY REMOVAL;  Surgeon: Irene Shipper, MD;  Location: [facility];  Service: Endoscopy;  Laterality: N/A;  . HIP PINNING,CANNULATED  11/07/2011   Procedure: CANNULATED HIP PINNING;  Surgeon: Jessy Oto, MD;  Location: [facility];  Service: Orthopedics;  Laterality: Left;  . ORIF FEMORAL NECK FRACTURE W/ [facility]  11/07/2011   Dr.Nitka   . TONSILLECTOMY     Dr.Joe Tamala Julian  . TUBAL LIGATION    . VAGINAL PROLAPSE REPAIR  2002   Dr.Horback     Allergies  Allergen Reactions  . Penicillins Other (See Comments)    CHILDHOOD REACTION  . Abilify [Aripiprazole] Rash    Outpatient Encounter Medications as of 05/03/2017  Medication Sig  . acetaminophen-codeine (TYLENOL #3) 300-30 MG tablet Take 1 tablet by mouth every 6 (six) hours as needed for moderate pain.  Marland Kitchen antiseptic oral rinse (BIOTENE) LIQD 15 mLs by Mouth Rinse route every 4 (four) hours as needed for dry mouth.   Marland Kitchen aspirin 81 MG tablet Take 81 mg by mouth daily.  Marland Kitchen buPROPion (WELLBUTRIN XL) 150 MG 24 hr tablet Take 450 mg by mouth daily.  . Calcium Carb-Cholecalciferol (CALTRATE 600+D) 600-800 MG-UNIT TABS Take 1 tablet by mouth 2 (two) times daily.  . chlorpheniramine-HYDROcodone (TUSSIONEX PENNKINETIC ER) 10-8 MG/5ML SUER Take 5 mLs by mouth every 12 (twelve) hours as needed for cough.   . cholecalciferol (VITAMIN D) 1000 UNITS tablet Take 1,000 Units by mouth daily.  . clonazePAM (KLONOPIN) 0.5 MG tablet Take 0.25 mg by mouth 2 (two) times daily. 8 am and 2 pm.  . clonazePAM (KLONOPIN) 0.5 MG tablet Take 0.25 mg by mouth daily as needed for anxiety (in addition to the scheduled dose).  Marland Kitchen escitalopram (LEXAPRO) 20 MG tablet Take 20 mg by mouth daily.  . Glucosamine HCl 1000 MG TABS Take 2,000 mg by mouth 2 (two) times daily.  Marland Kitchen ibandronate (BONIVA) 150  MG tablet Take 1 tablet (150 mg total) by mouth every 30 (thirty) days. Take in the morning with a full glass of water, on an empty stomach, and do not take anything else by mouth or lie down for the next 30 min.  . meloxicam (MOBIC) 7.5 MG tablet TAKE 1 TABLET BY MOUTH EVERY DAY AS NEEDED FOR ARTHRITIS  . Menthol, Topical Analgesic, (BIOFREEZE EX) Apply 1 application topically daily as needed.   . Multiple Vitamin (MULTIVITAMIN WITH MINERALS) TABS Take 1 tablet by mouth daily.  Marland Kitchen NIFEdipine (PROCARDIA XL/ADALAT-CC) 60 MG 24 hr tablet TAKE 1 TABLET BY MOUTH EVERY DAY TO CONTROL BLOOD PRESSURE  . pantoprazole (PROTONIX) 40 MG tablet One daily to reduce stomach acid  . Polyethyl Glycol-Propyl Glycol (SYSTANE) 0.4-0.3 % SOLN Place 1 drop into both eyes 2 (two) times daily.   . polyethylene glycol (MIRALAX / GLYCOLAX) packet Take 17 g by mouth daily.  . pravastatin (PRAVACHOL) 10 MG tablet Take 10 mg by mouth daily.   No facility-administered  encounter medications on file as of 05/03/2017.     Review of Systems  Constitutional: Positive for fatigue. Negative for appetite change, diaphoresis and fever.  HENT: Negative for congestion.   Respiratory: Negative for cough and shortness of breath.   Cardiovascular: Negative for chest pain and palpitations.  Gastrointestinal: Negative for abdominal pain, nausea and vomiting.  Genitourinary: Negative for dysuria.  Musculoskeletal: Positive for arthralgias, back pain and gait problem.  Skin: Negative for rash.  Neurological: Negative for dizziness and headaches.  Psychiatric/Behavioral: Positive for agitation, behavioral problems, decreased concentration and dysphoric mood. Negative for hallucinations, self-injury, sleep disturbance and suicidal ideas. The patient is nervous/anxious.     Immunization History  Administered Date(s) Administered  . Influenza Whole 02/14/2012, 02/14/2013  . Influenza-Unspecified 02/27/2014, 02/12/2015, 02/25/2016,  03/15/2017  . Pneumococcal Conjugate-13 07/13/2015  . Pneumococcal Polysaccharide-23 05/16/1998  . Td 05/16/2004  . Zoster 05/16/2005   Pertinent  Health Maintenance Due  Topic Date Due  . MAMMOGRAM  12/20/2017  . INFLUENZA VACCINE  Completed  . DEXA SCAN  Completed  . PNA vac Low Risk Adult  Completed   Fall Risk  04/21/2017 10/17/2016 10/14/2016 10/13/2016 05/31/2016  Falls in the past year? No No No No No   Functional Status Survey:    Vitals:   05/03/17 1440  BP: 115/66  Pulse: 82  Resp: 16  Temp: 98.3 F (36.8 C)  TempSrc: Oral  SpO2: 95%  Weight: 145 lb 3.2 oz (65.9 kg)  Height: 5\' 2"  (1.575 m)   Body mass index is 26.56 kg/m. Physical Exam  Constitutional: She is oriented to person, place, and time. She appears well-developed and well-nourished. No distress.  HENT:  Head: Normocephalic and atraumatic.  Mouth/Throat: Oropharynx is clear and moist.  Eyes: Conjunctivae are normal. Right eye exhibits no discharge. Left eye exhibits no discharge.  Neck: Normal range of motion. Neck supple.  Cardiovascular: Normal rate and regular rhythm.  Pulmonary/Chest: Effort normal.  Abdominal: Soft. Bowel sounds are normal.  Musculoskeletal: She exhibits deformity.  Lymphadenopathy:    She has no cervical adenopathy.  Neurological: She is alert and oriented to person, place, and time.  Skin: Skin is warm and dry. She is not diaphoretic.  Psychiatric:  Anxious, tearful    Labs reviewed: Recent Labs    10/14/16 1337 10/17/16 1512 02/20/17 0750  NA 130* 137 140  K 4.0 4.5 4.3  CL 95* 100 103  CO2 26 25 27   GLUCOSE 106* 172* 106*  BUN 21 13 15   CREATININE 0.98* 0.78 0.94*  CALCIUM 8.7 9.6 10.0   Recent Labs    05/23/16 0800 10/14/16 1337 10/17/16 1512 02/20/17 0750  AST 25 38* 25 19  ALT 17 48* 37* 13  ALKPHOS 59 72 61  --   BILITOT 0.4 0.5 0.2 0.4  PROT 7.1 5.6* 6.2 6.9  ALBUMIN 4.2 3.3* 3.6  --    Recent Labs    10/14/16 1337 10/17/16 1512  02/20/17 0750  WBC 19.0* 8.8 6.4  NEUTROABS  --  7,480 4,205  HGB 11.0* 11.8 13.3  HCT 33.7* 36.1 40.1  MCV 91.8 92.3 88.9  PLT 314 426* 275   Lab Results  Component Value Date   TSH 2.90 05/21/2015   No results found for: HGBA1C Lab Results  Component Value Date   CHOL 174 02/20/2017   HDL 63 02/20/2017   LDLCALC 90 05/23/2016   TRIG 123 02/20/2017   CHOLHDL 2.8 02/20/2017    Significant Diagnostic Results in last  30 days:  Mr Lumbar Spine W/o Contrast  Result Date: 04/11/2017 CLINICAL DATA:  Initial evaluation for chronic low back pain extending into the buttocks and right upper length. EXAM: MRI LUMBAR SPINE WITHOUT CONTRAST TECHNIQUE: Multiplanar, multisequence MR imaging of the lumbar spine was performed. No intravenous contrast was administered. COMPARISON:  Prior radiograph from 03/23/2017. FINDINGS: Segmentation: Normal segmentation. Lowest well-formed disc labeled the L5-S1 level. Alignment: Severe dextroscoliosis with apex at L2-3, stable from previous. Trace anterolisthesis of L3 on L4 and L4 on L5. Vertebrae: Vertebral body heights are maintained without evidence for acute or chronic fracture. There is abnormal marrow edema extending through the left sacral ala, consistent with acute insufficiency type fracture (series 8, image 19). Additionally, there is abnormal marrow edema with subtle linear lucency traversing the right iliac wing just lateral to the right SI joint, also consistent with acute insufficiency type fracture (series 100, image 43) marrow edema within the S2 and S3 segments also consistent with insufficiency fractures (series 8, image 12). Signal intensity within the underlying bone marrow is somewhat heterogeneous but within normal limits. Chronic reactive endplate changes seen at the left aspect of the L1-2 interspace and right aspect of the L5-S1 interspaces due to scoliotic curvature. No other worrisome osseous lesions. Conus medullaris and cauda equina:  Conus extends to the L1-2 level. Conus and cauda equina appear normal. Paraspinal and other soft tissues: Soft tissue edema seen surrounding the above described insufficiency type fractures. Chronic atrophy noted within the lower paraspinous musculature. Asymmetric atrophy also noted within the left psoas muscle. Small T2 hyperintense simple cysts noted within left kidney. Visualized visceral structures otherwise unremarkable. Intra-abdominal aorta is tortuous. Disc levels: T11-12: Minimal disc bulge. Right far lateral reactive endplate changes. No stenosis. T12-L1: Chronic intervertebral disc space narrowing with diffuse disc bulge and disc desiccation. Chronic reactive endplate changes with marginal endplate osteophytic spurring. Posterior disc osteophyte indents and partially effaces the ventral thecal sac. Left-sided facet hypertrophy. Resultant mild canal with left subarticular stenosis. Foramina are grossly patent. L1-2: Chronic intervertebral disc space narrowing with diffuse disc bulge and disc desiccation. Left far lateral/ extraforaminal reactive endplate changes. Moderate to advanced left-sided facet arthrosis. No significant canal narrowing. Mild to moderate left lateral recess narrowing/effacement. Mild left foraminal stenosis without impingement. Right neural foramen remains patent. L2-3: Chronic intervertebral disc space narrowing with diffuse disc bulge and disc desiccation. Endplate marginal osteophytic spurring, greater on the left. Moderate left-sided facet hypertrophy. Mild left lateral recess narrowing without significant canal stenosis. No significant foraminal encroachment. L3-4: Trace anterolisthesis. Chronic intervertebral disc space narrowing with disc desiccation. Severe left with moderate right facet arthrosis. Moderate left subarticular stenosis without significant canal narrowing. Mild to moderate left L3 foraminal stenosis. L4-5: Trace anterolisthesis. Diffuse disc bulge with disc  desiccation. Severe bilateral facet arthrosis, slightly worse on the right. Mild canal with moderate bilateral subarticular stenosis, worse on the right. No significant foraminal encroachment. L5-S1: Disc bulge with disc desiccation and intervertebral disc space narrowing. Right far lateral chronic reactive endplate changes. Severe right with moderate left facet arthrosis. Superimposed 6 mm synovial cyst at the anterior margin of the right L5-S1 facet (series 6, image 9). Reactive effusion present within the left L5-S1 facet. Mild to moderate right L5 foraminal stenosis related disc osteophyte and facet disease. No significant left foraminal encroachment. Central canal and lateral recesses are widely patent. IMPRESSION: 1. Acute insufficiency type fractures involving the left sacral ala, right iliac wing, as well as the S2 and S3 segments. 2. Severe  dextroscoliosis with apex at L2-3. Associated chronic multilevel degenerative spondylolysis and facet arthrosis as above. Resultant mild-to-moderate lateral recess narrowing at L1-2 thru L4-5 as above, overall worse on the left. No significant central canal stenosis or evidence for neural impingement. 3. Moderate right L5 foraminal stenosis related to disc osteophyte and facet disease. Electronically Signed   By: Jeannine Boga M.D.   On: 04/11/2017 13:56    Assessment/Plan  Severe depression Followed by psychiatry services. She is currently on lexapro 20 mg daily and bupropion 450 mg daily. Increase lexapro to 30 mg daily and monitor. Pt to see psych service 1st week of January. counselled on keeping her involved in group activities and try to be out of the room and be with other residents. Also recommend psychologist counselling  GAD Continue current dosing of clonazepam 0.25 mg bid and additional dosing of 0.25 mg daily as needed. Continue lexapro as above.   Family/ staff Communication: reviewed care plan with patient and charge nurse.     Labs/tests ordered:  none  Blanchie Serve, MD Internal Medicine [facility] [address] [place], [place] [address] Cell Phone (Monday-Friday 8 am - 5 pm): [phone] On Call: [phone] and follow prompts after 5 pm and on weekends Office Phone: [phone] Office Fax: [phone]

## 2017-05-04 ENCOUNTER — Ambulatory Visit (INDEPENDENT_AMBULATORY_CARE_PROVIDER_SITE_OTHER): Payer: Medicare Other | Admitting: Specialist

## 2017-05-04 DIAGNOSIS — F411 Generalized anxiety disorder: Secondary | ICD-10-CM | POA: Diagnosis not present

## 2017-05-04 DIAGNOSIS — R29898 Other symptoms and signs involving the musculoskeletal system: Secondary | ICD-10-CM | POA: Diagnosis not present

## 2017-05-04 DIAGNOSIS — R2681 Unsteadiness on feet: Secondary | ICD-10-CM | POA: Diagnosis not present

## 2017-05-04 DIAGNOSIS — M6281 Muscle weakness (generalized): Secondary | ICD-10-CM | POA: Diagnosis not present

## 2017-05-05 DIAGNOSIS — M6281 Muscle weakness (generalized): Secondary | ICD-10-CM | POA: Diagnosis not present

## 2017-05-05 DIAGNOSIS — R2681 Unsteadiness on feet: Secondary | ICD-10-CM | POA: Diagnosis not present

## 2017-05-05 DIAGNOSIS — R29898 Other symptoms and signs involving the musculoskeletal system: Secondary | ICD-10-CM | POA: Diagnosis not present

## 2017-05-07 DIAGNOSIS — R2681 Unsteadiness on feet: Secondary | ICD-10-CM | POA: Diagnosis not present

## 2017-05-07 DIAGNOSIS — R29898 Other symptoms and signs involving the musculoskeletal system: Secondary | ICD-10-CM | POA: Diagnosis not present

## 2017-05-07 DIAGNOSIS — M6281 Muscle weakness (generalized): Secondary | ICD-10-CM | POA: Diagnosis not present

## 2017-05-11 DIAGNOSIS — R2681 Unsteadiness on feet: Secondary | ICD-10-CM | POA: Diagnosis not present

## 2017-05-11 DIAGNOSIS — M6281 Muscle weakness (generalized): Secondary | ICD-10-CM | POA: Diagnosis not present

## 2017-05-11 DIAGNOSIS — R29898 Other symptoms and signs involving the musculoskeletal system: Secondary | ICD-10-CM | POA: Diagnosis not present

## 2017-05-12 NOTE — Progress Notes (Signed)
Office Visit Note   Patient: Tina Hampton           Date of Birth: 07/30/1925           MRN: [id number] Visit Date: 03/23/2017              Requested by: Blanchie Serve, MD [address] [place], [place] [address] PCP: Blanchie Serve, MD   Assessment & Plan: Visit Diagnoses:  1. Spinal stenosis of lumbar region with neurogenic claudication     Plan: With patient's ongoing symptoms and failed conservative treatment at this point we'll schedule lumbar MRI.  Follow-up after completion to discuss results and further treatment options.  Follow-Up Instructions: Return in about 3 weeks (around 04/13/2017) for nitka to review MRI.   Orders:  Orders Placed This Encounter  Procedures  . XR Lumbar Spine 2-3 Views  . MR Lumbar Spine w/o contrast   Meds ordered this encounter  Medications  . DISCONTD: acetaminophen-codeine (TYLENOL #3) 300-30 MG tablet    Sig: Take 1 tablet every 8 (eight) hours as needed by mouth for moderate pain.    Dispense:  30 tablet    Refill:  0      Procedures: No procedures performed   Clinical Data: No additional findings.   Subjective: Chief Complaint  Patient presents with  . Lower Back - Pain    HPI Patient returns with complaints of ongoing chronic low back pain. Has not had any relief with the lumbar ESI. She is complaining of ongoing pain more so on the right side of her back and occasional pain on the left side. Continues to describe having ongoing neurogenic claudication symptoms. Review of Systems No current cardiopulmonary GI GU issues  Objective: Vital Signs: BP (!) 143/80 (BP Location: Left Arm, Patient Position: Sitting)   Pulse 92   Ht 5\' 3"  (1.6 m)   Wt 154 lb (69.9 kg)   BMI 27.28 kg/m   Physical Exam  Constitutional: No distress.  HENT:  Head: Normocephalic and atraumatic.  Eyes: EOM are normal. Pupils are equal, round, and reactive to light.  Pulmonary/Chest: No respiratory distress.  Musculoskeletal:  Lumbar  paraspinal tenderness. No focal motor deficits.  Skin: Skin is warm and dry.  Psychiatric: She has a normal mood and affect.    Ortho Exam  Specialty Comments:  No specialty comments available.  Imaging: No results found.   PMFS History: Patient Active Problem List   Diagnosis Date Noted  . Severe episode of recurrent major depressive disorder, without psychotic features ([place]) 05/03/2017  . GAD (generalized anxiety disorder) 05/03/2017  . Spinal stenosis of lumbar region with neurogenic claudication 03/01/2017  . Major depression, recurrent, chronic ([place]) 03/01/2017  . Primary osteoarthritis involving multiple joints 03/01/2017  . OAB (overactive bladder) 03/01/2017  . Rash 10/25/2016  . Hearing loss 10/25/2016  . Chronic bilateral low back pain without sciatica 08/17/2016  . Pain in joint, shoulder region 05/31/2016  . Sprain of anterior talofibular ligament 04/27/2016  . Pain of left scapula 10/20/2015  . Tremor 06/02/2015  . Vaginal stenosis 11/18/2014  . PAOD (peripheral arterial occlusive disease) ([place]) 11/18/2014  . Age-related osteoporosis without current pathological fracture 11/18/2014  . Hyperglycemia 11/18/2014  . Left inguinal hernia 04/24/2014  . Diverticulosis of colon with hemorrhage 04/24/2014  . Aortic ejection murmur 04/24/2014  . Dysphagia 01/24/2014  . Esophageal stricture 01/24/2014  . Hip fracture ([place]) 11/07/2011  . GERD (gastroesophageal reflux disease)   . Hypertension   . Hyperlipidemia LDL  goal <100   . Hiatal hernia   . Scoliosis   . DDD (degenerative disc disease)   . Depression   . Senile osteoporosis 12/14/2004  . Postmenopausal atrophic vaginitis 12/14/2004  . Edema 11/26/2004  . Urinary incontinence 11/26/2004   Past Medical History:  Diagnosis Date  . Abnormal mammogram, unspecified 04/19/2011  . Allergy   . Anxiety   . Backache, unspecified 01/21/2005  . Cervicalgia 08/20/2013  . Chronic kidney disease, stage II (mild)  04/03/2009  . Chronic rhinitis 09/28/2010  . DDD (degenerative disc disease)   . Depression   . Dermatophytosis of nail 01/21/2005  . Diverticulosis of colon (without mention of hemorrhage) 06/17/2005  . Edema 11/26/2004  . GERD (gastroesophageal reflux disease)   . Hiatal hernia   . High cholesterol   . Hyperglycemia 11/18/2014  . Hypertension   . Insomnia, unspecified 10/18/2011  . Left inguinal hernia 04/24/2014  . Nocturia 09/19/2008  . Osteoporosis, senile 11/18/2014  . Other abnormal blood chemistry 04/03/2007  . Pain in joint, pelvic region and thigh 11/12/2011  . Pain in joint, shoulder region 08/15/2006  . Pain in limb 08/26/2005  . Palpitations 09/29/2009  . PAOD (peripheral arterial occlusive disease) ([place]) 11/18/2014   Right leg; diminished popliteal, dorsalis pedis, and posterior tibial artery pulsations   . Postmenopausal atrophic vaginitis 12/14/2004  . Scoliosis   . Senile osteoporosis 12/14/2004  . Tension headache 10/04/2005  . Unspecified cataract 11/26/2004  . Unspecified constipation 01/21/2005  . Unspecified urinary incontinence 11/26/2004  . Vaginal stenosis 11/18/2014   Tight band about 1/2 inches into the vagina which will not allow passage of my index finger.     Family History  Problem Relation Age of Onset  . Heart disease Mother   . Stroke Father   . Heart disease Brother   . Esophageal cancer Neg Hx   . Stomach cancer Neg Hx   . Colon cancer Neg Hx     Past Surgical History:  Procedure Laterality Date  . ABDOMINAL HYSTERECTOMY  1969   Dr.Braun   . bilateral eyelid surgery  2005   Dr.Scott  . BILATERAL OOPHORECTOMY  2002  . BREAST SURGERY     benign tumor removal, left breast 1969  . CATARACT EXTRACTION     Right  . CHOLECYSTECTOMY  1990   Dr.Moore   . COLONOSCOPY  3/18/20008   inflammation, diverticular associated colitis -Dr. Ardis Hughs  . COSMETIC SURGERY    . ESOPHAGOGASTRODUODENOSCOPY N/A 01/24/2014   Procedure: ESOPHAGOGASTRODUODENOSCOPY (EGD);  Surgeon: Irene Shipper, MD;  Location[facility];  Service: Endoscopy;  Laterality: N/A;  . ESOPHAGOGASTRODUODENOSCOPY ENDOSCOPY  04/04/14   Dr. Ardis Hughs  . EYE SURGERY    . FOREIGN BODY REMOVAL N/A 01/24/2014   Procedure: FOREIGN BODY REMOVAL;  Surgeon: Irene Shipper, MD;  Location: [facility];  Service: Endoscopy;  Laterality: N/A;  . HIP PINNING,CANNULATED  11/07/2011   Procedure: CANNULATED HIP PINNING;  Surgeon: Jessy Oto, MD;  Location: [facility];  Service: Orthopedics;  Laterality: Left;  . ORIF FEMORAL NECK FRACTURE W/ [facility]  11/07/2011   Dr.Nitka   . TONSILLECTOMY     Dr.Joe Tamala Julian  . TUBAL LIGATION    . VAGINAL PROLAPSE REPAIR  2002   Dr.Horback    Social History   Occupational History  . 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 Smoking status: Never Smoker  . Smokeless tobacco: Never Used  Substance and Sexual Activity  .  Alcohol use: Yes    Alcohol/week: 4.2 oz    Types: 7 Glasses of wine per week    Comment: one glass of wine in evening  . Drug use: No  . Sexual activity: No

## 2017-05-15 DIAGNOSIS — R29898 Other symptoms and signs involving the musculoskeletal system: Secondary | ICD-10-CM | POA: Diagnosis not present

## 2017-05-15 DIAGNOSIS — R2681 Unsteadiness on feet: Secondary | ICD-10-CM | POA: Diagnosis not present

## 2017-05-15 DIAGNOSIS — M6281 Muscle weakness (generalized): Secondary | ICD-10-CM | POA: Diagnosis not present

## 2017-05-17 DIAGNOSIS — M542 Cervicalgia: Secondary | ICD-10-CM | POA: Diagnosis not present

## 2017-05-17 DIAGNOSIS — R2681 Unsteadiness on feet: Secondary | ICD-10-CM | POA: Diagnosis not present

## 2017-05-17 DIAGNOSIS — F411 Generalized anxiety disorder: Secondary | ICD-10-CM | POA: Diagnosis not present

## 2017-05-17 DIAGNOSIS — M170 Bilateral primary osteoarthritis of knee: Secondary | ICD-10-CM | POA: Diagnosis not present

## 2017-05-17 DIAGNOSIS — M6281 Muscle weakness (generalized): Secondary | ICD-10-CM | POA: Diagnosis not present

## 2017-05-17 DIAGNOSIS — E108 Type 1 diabetes mellitus with unspecified complications: Secondary | ICD-10-CM | POA: Diagnosis not present

## 2017-05-18 DIAGNOSIS — F333 Major depressive disorder, recurrent, severe with psychotic symptoms: Secondary | ICD-10-CM | POA: Diagnosis not present

## 2017-05-19 DIAGNOSIS — M542 Cervicalgia: Secondary | ICD-10-CM | POA: Diagnosis not present

## 2017-05-19 DIAGNOSIS — E108 Type 1 diabetes mellitus with unspecified complications: Secondary | ICD-10-CM | POA: Diagnosis not present

## 2017-05-19 DIAGNOSIS — M170 Bilateral primary osteoarthritis of knee: Secondary | ICD-10-CM | POA: Diagnosis not present

## 2017-05-19 DIAGNOSIS — R2681 Unsteadiness on feet: Secondary | ICD-10-CM | POA: Diagnosis not present

## 2017-05-19 DIAGNOSIS — M6281 Muscle weakness (generalized): Secondary | ICD-10-CM | POA: Diagnosis not present

## 2017-05-22 DIAGNOSIS — M170 Bilateral primary osteoarthritis of knee: Secondary | ICD-10-CM | POA: Diagnosis not present

## 2017-05-22 DIAGNOSIS — M542 Cervicalgia: Secondary | ICD-10-CM | POA: Diagnosis not present

## 2017-05-22 DIAGNOSIS — M6281 Muscle weakness (generalized): Secondary | ICD-10-CM | POA: Diagnosis not present

## 2017-05-22 DIAGNOSIS — R2681 Unsteadiness on feet: Secondary | ICD-10-CM | POA: Diagnosis not present

## 2017-05-22 DIAGNOSIS — E108 Type 1 diabetes mellitus with unspecified complications: Secondary | ICD-10-CM | POA: Diagnosis not present

## 2017-05-24 DIAGNOSIS — M542 Cervicalgia: Secondary | ICD-10-CM | POA: Diagnosis not present

## 2017-05-24 DIAGNOSIS — M170 Bilateral primary osteoarthritis of knee: Secondary | ICD-10-CM | POA: Diagnosis not present

## 2017-05-24 DIAGNOSIS — R2681 Unsteadiness on feet: Secondary | ICD-10-CM | POA: Diagnosis not present

## 2017-05-24 DIAGNOSIS — M6281 Muscle weakness (generalized): Secondary | ICD-10-CM | POA: Diagnosis not present

## 2017-05-24 DIAGNOSIS — E108 Type 1 diabetes mellitus with unspecified complications: Secondary | ICD-10-CM | POA: Diagnosis not present

## 2017-05-26 ENCOUNTER — Ambulatory Visit (INDEPENDENT_AMBULATORY_CARE_PROVIDER_SITE_OTHER): Payer: Medicare Other | Admitting: Specialist

## 2017-05-26 ENCOUNTER — Telehealth (INDEPENDENT_AMBULATORY_CARE_PROVIDER_SITE_OTHER): Payer: Self-pay | Admitting: Specialist

## 2017-05-26 DIAGNOSIS — M542 Cervicalgia: Secondary | ICD-10-CM | POA: Diagnosis not present

## 2017-05-26 DIAGNOSIS — R2681 Unsteadiness on feet: Secondary | ICD-10-CM | POA: Diagnosis not present

## 2017-05-26 DIAGNOSIS — M170 Bilateral primary osteoarthritis of knee: Secondary | ICD-10-CM | POA: Diagnosis not present

## 2017-05-26 DIAGNOSIS — E108 Type 1 diabetes mellitus with unspecified complications: Secondary | ICD-10-CM | POA: Diagnosis not present

## 2017-05-26 DIAGNOSIS — M6281 Muscle weakness (generalized): Secondary | ICD-10-CM | POA: Diagnosis not present

## 2017-05-26 NOTE — Telephone Encounter (Signed)
Added to cancellation list 

## 2017-05-26 NOTE — Telephone Encounter (Signed)
Patient called this morning and couldn't come to her appt due to her ride being sick. Please call her if there are any cancellations between now and her appt 02/08. [phone]

## 2017-05-29 DIAGNOSIS — M170 Bilateral primary osteoarthritis of knee: Secondary | ICD-10-CM | POA: Diagnosis not present

## 2017-05-29 DIAGNOSIS — R2681 Unsteadiness on feet: Secondary | ICD-10-CM | POA: Diagnosis not present

## 2017-05-29 DIAGNOSIS — E108 Type 1 diabetes mellitus with unspecified complications: Secondary | ICD-10-CM | POA: Diagnosis not present

## 2017-05-29 DIAGNOSIS — M542 Cervicalgia: Secondary | ICD-10-CM | POA: Diagnosis not present

## 2017-05-29 DIAGNOSIS — M6281 Muscle weakness (generalized): Secondary | ICD-10-CM | POA: Diagnosis not present

## 2017-05-31 DIAGNOSIS — M6281 Muscle weakness (generalized): Secondary | ICD-10-CM | POA: Diagnosis not present

## 2017-05-31 DIAGNOSIS — R2681 Unsteadiness on feet: Secondary | ICD-10-CM | POA: Diagnosis not present

## 2017-05-31 DIAGNOSIS — M542 Cervicalgia: Secondary | ICD-10-CM | POA: Diagnosis not present

## 2017-05-31 DIAGNOSIS — F411 Generalized anxiety disorder: Secondary | ICD-10-CM | POA: Diagnosis not present

## 2017-05-31 DIAGNOSIS — E108 Type 1 diabetes mellitus with unspecified complications: Secondary | ICD-10-CM | POA: Diagnosis not present

## 2017-05-31 DIAGNOSIS — M170 Bilateral primary osteoarthritis of knee: Secondary | ICD-10-CM | POA: Diagnosis not present

## 2017-06-02 DIAGNOSIS — E108 Type 1 diabetes mellitus with unspecified complications: Secondary | ICD-10-CM | POA: Diagnosis not present

## 2017-06-02 DIAGNOSIS — M542 Cervicalgia: Secondary | ICD-10-CM | POA: Diagnosis not present

## 2017-06-02 DIAGNOSIS — M6281 Muscle weakness (generalized): Secondary | ICD-10-CM | POA: Diagnosis not present

## 2017-06-02 DIAGNOSIS — M170 Bilateral primary osteoarthritis of knee: Secondary | ICD-10-CM | POA: Diagnosis not present

## 2017-06-02 DIAGNOSIS — R2681 Unsteadiness on feet: Secondary | ICD-10-CM | POA: Diagnosis not present

## 2017-06-05 DIAGNOSIS — M6281 Muscle weakness (generalized): Secondary | ICD-10-CM | POA: Diagnosis not present

## 2017-06-05 DIAGNOSIS — M542 Cervicalgia: Secondary | ICD-10-CM | POA: Diagnosis not present

## 2017-06-05 DIAGNOSIS — E108 Type 1 diabetes mellitus with unspecified complications: Secondary | ICD-10-CM | POA: Diagnosis not present

## 2017-06-05 DIAGNOSIS — M170 Bilateral primary osteoarthritis of knee: Secondary | ICD-10-CM | POA: Diagnosis not present

## 2017-06-05 DIAGNOSIS — R2681 Unsteadiness on feet: Secondary | ICD-10-CM | POA: Diagnosis not present

## 2017-06-06 ENCOUNTER — Ambulatory Visit (INDEPENDENT_AMBULATORY_CARE_PROVIDER_SITE_OTHER): Payer: Medicare Other

## 2017-06-06 ENCOUNTER — Ambulatory Visit (INDEPENDENT_AMBULATORY_CARE_PROVIDER_SITE_OTHER): Payer: Medicare Other | Admitting: Specialist

## 2017-06-06 ENCOUNTER — Encounter (INDEPENDENT_AMBULATORY_CARE_PROVIDER_SITE_OTHER): Payer: Self-pay | Admitting: Specialist

## 2017-06-06 DIAGNOSIS — M8000XA Age-related osteoporosis with current pathological fracture, unspecified site, initial encounter for fracture: Secondary | ICD-10-CM | POA: Diagnosis not present

## 2017-06-06 DIAGNOSIS — M4125 Other idiopathic scoliosis, thoracolumbar region: Secondary | ICD-10-CM | POA: Diagnosis not present

## 2017-06-06 MED ORDER — IBANDRONATE SODIUM 150 MG PO TABS: 150.0000 mg | ORAL_TABLET | ORAL | 0 refills | Status: DC

## 2017-06-06 NOTE — Progress Notes (Signed)
Office Visit Note   Patient: Tina Hampton           Date of Birth: [birth year]           MRN: [id number] Visit Date: 06/06/2017              Requested by: Blanchie Serve, MD [address] [place], [place] [address] PCP: Blanchie Serve, MD   Assessment & Plan: Visit Diagnoses:  1. Other idiopathic scoliosis, thoracolumbar region   2. Osteoporosis with current pathological fracture, unspecified osteoporosis type, initial encounter     Plan: Continue with Bovina 150 mg po each month with a glass of water. Avoid frequent bending and stooping  No lifting greater than 10 lbs. May use ice or moist heat for pain. Weight loss is of benefit. Handicap license is approved May resume pool exercises. PT may continue until she is independent.  Will need evaluation of osteoporosis for long term care and reevaluation of osteoporosis In the future.  Caltrate one tablet one tablet twice a day.   Follow-Up Instructions: No Follow-up on file.   Orders:  Orders Placed This Encounter  Procedures  . XR Thoracic Spine 2 View   No orders of the defined types were placed in this encounter.     Procedures: No procedures performed   Clinical Data: No additional findings.   Subjective: No chief complaint on file.   84 year old female with history of dorsal compression fx T12 and sacral insufficiency fracture. She has been in PT now over the last 2-3 weeks and she is seeing improvement in her back and  Sacral pain. Reports she is using an electrical stimulation for the muscles and for the back pain. She is using the walker for standing and walking. Brings with her exercises that she indicates is helping.     Review of Systems  Constitutional: Negative.   HENT: Negative.   Eyes: Negative.   Respiratory: Negative.   Cardiovascular: Negative.   Gastrointestinal: Negative.   Endocrine: Negative.   Genitourinary: Negative.   Musculoskeletal: Negative.   Skin: Negative.     Allergic/Immunologic: Negative.   Neurological: Negative.   Hematological: Negative.   Psychiatric/Behavioral: Negative.      Objective: Vital Signs: There were no vitals taken for this visit.  Physical Exam  Constitutional: She is oriented to person, place, and time. She appears well-developed and well-nourished.  HENT:  Head: Normocephalic and atraumatic.  Eyes: EOM are normal. Pupils are equal, round, and reactive to light.  Neck: Normal range of motion. Neck supple.  Pulmonary/Chest: Effort normal and breath sounds normal.  Abdominal: Soft. Bowel sounds are normal.  Neurological: She is alert and oriented to person, place, and time.  Skin: Skin is warm and dry.  Psychiatric: She has a normal mood and affect. Her behavior is normal. Judgment and thought content normal.    Back Exam   Tenderness  The patient is experiencing tenderness in the lumbar.  Range of Motion  Extension: abnormal  Flexion: abnormal  Lateral bend right: normal  Lateral bend left: normal  Rotation right: normal  Rotation left: normal   Muscle Strength  The patient has normal back strength. Right Quadriceps:  5/5  Left Quadriceps:  5/5  Right Hamstrings:  5/5  Left Hamstrings:  5/5   Reflexes  Babinski's sign: normal   Other  Toe walk: abnormal Heel walk: abnormal Sensation: normal Gait: normal       Specialty Comments:  No specialty comments available.  Imaging: Xr Thoracic Spine 2 View  Result Date: 06/06/2017 AP and lateral of the Thoracolumbar and sacrum show a significant scoliosis curve with right offset of L3 on L4 and L4 on L5.The areas of insufficiency fractures of the sacrum and right iliac wing is negative for displacene    PMFS History: Patient Active Problem List   Diagnosis Date Noted  . Severe episode of recurrent major depressive disorder, without psychotic features ([place]) 05/03/2017  . GAD (generalized anxiety disorder) 05/03/2017  . Spinal stenosis of  lumbar region with neurogenic claudication 03/01/2017  . Major depression, recurrent, chronic ([place]) 03/01/2017  . Primary osteoarthritis involving multiple joints 03/01/2017  . OAB (overactive bladder) 03/01/2017  . Rash 10/25/2016  . Hearing loss 10/25/2016  . Chronic bilateral low back pain without sciatica 08/17/2016  . Pain in joint, shoulder region 05/31/2016  . Sprain of anterior talofibular ligament 04/27/2016  . Pain of left scapula 10/20/2015  . Tremor 06/02/2015  . Vaginal stenosis 11/18/2014  . PAOD (peripheral arterial occlusive disease) ([place]) 11/18/2014  . Age-related osteoporosis without current pathological fracture 11/18/2014  . Hyperglycemia 11/18/2014  . Left inguinal hernia 04/24/2014  . Diverticulosis of colon with hemorrhage 04/24/2014  . Aortic ejection murmur 04/24/2014  . Dysphagia 01/24/2014  . Esophageal stricture 01/24/2014  . Hip fracture ([place]) 11/07/2011  . GERD (gastroesophageal reflux disease)   . Hypertension   . Hyperlipidemia LDL goal <100   . Hiatal hernia   . Scoliosis   . DDD (degenerative disc disease)   . Depression   . Senile osteoporosis 12/14/2004  . Postmenopausal atrophic vaginitis 12/14/2004  . Edema 11/26/2004  . Urinary incontinence 11/26/2004   Past Medical History:  Diagnosis Date  . Abnormal mammogram, unspecified 04/19/2011  . Allergy   . Anxiety   . Backache, unspecified 01/21/2005  . Cervicalgia 08/20/2013  . Chronic kidney disease, stage II (mild) 04/03/2009  . Chronic rhinitis 09/28/2010  . DDD (degenerative disc disease)   . Depression   . Dermatophytosis of nail 01/21/2005  . Diverticulosis of colon (without mention of hemorrhage) 06/17/2005  . Edema 11/26/2004  . GERD (gastroesophageal reflux disease)   . Hiatal hernia   . High cholesterol   . Hyperglycemia 11/18/2014  . Hypertension   . Insomnia, unspecified 10/18/2011  . Left inguinal hernia 04/24/2014  . Nocturia 09/19/2008  . Osteoporosis, senile 11/18/2014  . Other  abnormal blood chemistry 04/03/2007  . Pain in joint, pelvic region and thigh 11/12/2011  . Pain in joint, shoulder region 08/15/2006  . Pain in limb 08/26/2005  . Palpitations 09/29/2009  . PAOD (peripheral arterial occlusive disease) ([place]) 11/18/2014   Right leg; diminished popliteal, dorsalis pedis, and posterior tibial artery pulsations   . Postmenopausal atrophic vaginitis 12/14/2004  . Scoliosis   . Senile osteoporosis 12/14/2004  . Tension headache 10/04/2005  . Unspecified cataract 11/26/2004  . Unspecified constipation 01/21/2005  . Unspecified urinary incontinence 11/26/2004  . Vaginal stenosis 11/18/2014   Tight band about 1/2 inches into the vagina which will not allow passage of my index finger.     Family History  Problem Relation Age of Onset  . Heart disease Mother   . Stroke Father   . Heart disease Brother   . Esophageal cancer Neg Hx   . Stomach cancer Neg Hx   . Colon cancer Neg Hx     Past Surgical History:  Procedure Laterality Date  . ABDOMINAL HYSTERECTOMY  1969   Dr.Braun   . bilateral eyelid surgery  2005   Dr.Scott  . BILATERAL OOPHORECTOMY  2002  . BREAST SURGERY     benign tumor removal, left breast 1969  . CATARACT EXTRACTION     Right  . CHOLECYSTECTOMY  1990   Dr.Moore   . COLONOSCOPY  3/18/20008   inflammation, diverticular associated colitis -Dr. Ardis Hughs  . COSMETIC SURGERY    . ESOPHAGOGASTRODUODENOSCOPY N/A 01/24/2014   Procedure: ESOPHAGOGASTRODUODENOSCOPY (EGD);  Surgeon: Irene Shipper, MD;  Location: [facility] ENDOSCOPY;  Service: Endoscopy;  Laterality: N/A;  . ESOPHAGOGASTRODUODENOSCOPY ENDOSCOPY  04/04/14   Dr. Ardis Hughs  . EYE SURGERY    . FOREIGN BODY REMOVAL N/A 01/24/2014   Procedure: FOREIGN BODY REMOVAL;  Surgeon: Irene Shipper, MD;  Location: [facility];  Service: Endoscopy;  Laterality: N/A;  . HIP PINNING,CANNULATED  11/07/2011   Procedure: CANNULATED HIP PINNING;  Surgeon: Jessy Oto, MD;  Location: [facility];  Service: Orthopedics;  Laterality:  Left;  . ORIF FEMORAL NECK FRACTURE W/ [facility]  11/07/2011   Dr.Ingri Diemer   . TONSILLECTOMY     Dr.Joe Tamala Julian  . TUBAL LIGATION    . VAGINAL PROLAPSE REPAIR  2002   Dr.Horback    Social History   Occupational History  . 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 Smoking status: Never Smoker  . Smokeless tobacco: Never Used  Substance and Sexual Activity  . Alcohol use: Yes    Alcohol/week: 4.2 oz    Types: 7 Glasses of wine per week    Comment: one glass of wine in evening  . Drug use: No  . Sexual activity: No

## 2017-06-06 NOTE — Patient Instructions (Addendum)
Continue with Bovina 150 mg po each month with a glass of water. Avoid frequent bending and stooping  No lifting greater than 10 lbs. May use ice or moist heat for pain. Weight loss is of benefit. Handicap license is approved. May resume pool exercises. PT may continue until she is independent.  Will need evaluation of osteoporosis for long term care and reevaluation of osteoporosis In the future.  Caltrate one tablet one tablet twice a day.

## 2017-06-07 DIAGNOSIS — M6281 Muscle weakness (generalized): Secondary | ICD-10-CM | POA: Diagnosis not present

## 2017-06-07 DIAGNOSIS — E108 Type 1 diabetes mellitus with unspecified complications: Secondary | ICD-10-CM | POA: Diagnosis not present

## 2017-06-07 DIAGNOSIS — R2681 Unsteadiness on feet: Secondary | ICD-10-CM | POA: Diagnosis not present

## 2017-06-07 DIAGNOSIS — M542 Cervicalgia: Secondary | ICD-10-CM | POA: Diagnosis not present

## 2017-06-07 DIAGNOSIS — M170 Bilateral primary osteoarthritis of knee: Secondary | ICD-10-CM | POA: Diagnosis not present

## 2017-06-08 DIAGNOSIS — F411 Generalized anxiety disorder: Secondary | ICD-10-CM | POA: Diagnosis not present

## 2017-06-09 DIAGNOSIS — M542 Cervicalgia: Secondary | ICD-10-CM | POA: Diagnosis not present

## 2017-06-09 DIAGNOSIS — M6281 Muscle weakness (generalized): Secondary | ICD-10-CM | POA: Diagnosis not present

## 2017-06-09 DIAGNOSIS — E108 Type 1 diabetes mellitus with unspecified complications: Secondary | ICD-10-CM | POA: Diagnosis not present

## 2017-06-09 DIAGNOSIS — M170 Bilateral primary osteoarthritis of knee: Secondary | ICD-10-CM | POA: Diagnosis not present

## 2017-06-09 DIAGNOSIS — R2681 Unsteadiness on feet: Secondary | ICD-10-CM | POA: Diagnosis not present

## 2017-06-12 DIAGNOSIS — M6281 Muscle weakness (generalized): Secondary | ICD-10-CM | POA: Diagnosis not present

## 2017-06-12 DIAGNOSIS — R2681 Unsteadiness on feet: Secondary | ICD-10-CM | POA: Diagnosis not present

## 2017-06-12 DIAGNOSIS — M542 Cervicalgia: Secondary | ICD-10-CM | POA: Diagnosis not present

## 2017-06-12 DIAGNOSIS — E108 Type 1 diabetes mellitus with unspecified complications: Secondary | ICD-10-CM | POA: Diagnosis not present

## 2017-06-12 DIAGNOSIS — M170 Bilateral primary osteoarthritis of knee: Secondary | ICD-10-CM | POA: Diagnosis not present

## 2017-06-14 DIAGNOSIS — M170 Bilateral primary osteoarthritis of knee: Secondary | ICD-10-CM | POA: Diagnosis not present

## 2017-06-14 DIAGNOSIS — R2681 Unsteadiness on feet: Secondary | ICD-10-CM | POA: Diagnosis not present

## 2017-06-14 DIAGNOSIS — E108 Type 1 diabetes mellitus with unspecified complications: Secondary | ICD-10-CM | POA: Diagnosis not present

## 2017-06-14 DIAGNOSIS — M542 Cervicalgia: Secondary | ICD-10-CM | POA: Diagnosis not present

## 2017-06-14 DIAGNOSIS — M6281 Muscle weakness (generalized): Secondary | ICD-10-CM | POA: Diagnosis not present

## 2017-06-16 DIAGNOSIS — R2681 Unsteadiness on feet: Secondary | ICD-10-CM | POA: Diagnosis not present

## 2017-06-16 DIAGNOSIS — M6281 Muscle weakness (generalized): Secondary | ICD-10-CM | POA: Diagnosis not present

## 2017-06-19 DIAGNOSIS — R2681 Unsteadiness on feet: Secondary | ICD-10-CM | POA: Diagnosis not present

## 2017-06-19 DIAGNOSIS — M6281 Muscle weakness (generalized): Secondary | ICD-10-CM | POA: Diagnosis not present

## 2017-06-20 DIAGNOSIS — M6281 Muscle weakness (generalized): Secondary | ICD-10-CM | POA: Diagnosis not present

## 2017-06-20 DIAGNOSIS — R2681 Unsteadiness on feet: Secondary | ICD-10-CM | POA: Diagnosis not present

## 2017-06-21 DIAGNOSIS — F411 Generalized anxiety disorder: Secondary | ICD-10-CM | POA: Diagnosis not present

## 2017-06-22 DIAGNOSIS — H01022 Squamous blepharitis right lower eyelid: Secondary | ICD-10-CM | POA: Diagnosis not present

## 2017-06-22 DIAGNOSIS — H16223 Keratoconjunctivitis sicca, not specified as Sjogren's, bilateral: Secondary | ICD-10-CM | POA: Diagnosis not present

## 2017-06-22 DIAGNOSIS — H01025 Squamous blepharitis left lower eyelid: Secondary | ICD-10-CM | POA: Diagnosis not present

## 2017-06-22 DIAGNOSIS — H10413 Chronic giant papillary conjunctivitis, bilateral: Secondary | ICD-10-CM | POA: Diagnosis not present

## 2017-06-22 DIAGNOSIS — H01021 Squamous blepharitis right upper eyelid: Secondary | ICD-10-CM | POA: Diagnosis not present

## 2017-06-22 DIAGNOSIS — H01024 Squamous blepharitis left upper eyelid: Secondary | ICD-10-CM | POA: Diagnosis not present

## 2017-06-23 ENCOUNTER — Ambulatory Visit (INDEPENDENT_AMBULATORY_CARE_PROVIDER_SITE_OTHER): Payer: Medicare Other | Admitting: Specialist

## 2017-06-23 DIAGNOSIS — R2681 Unsteadiness on feet: Secondary | ICD-10-CM | POA: Diagnosis not present

## 2017-06-23 DIAGNOSIS — M6281 Muscle weakness (generalized): Secondary | ICD-10-CM | POA: Diagnosis not present

## 2017-06-26 DIAGNOSIS — M6281 Muscle weakness (generalized): Secondary | ICD-10-CM | POA: Diagnosis not present

## 2017-06-26 DIAGNOSIS — R2681 Unsteadiness on feet: Secondary | ICD-10-CM | POA: Diagnosis not present

## 2017-06-28 DIAGNOSIS — L821 Other seborrheic keratosis: Secondary | ICD-10-CM | POA: Diagnosis not present

## 2017-06-28 DIAGNOSIS — R2681 Unsteadiness on feet: Secondary | ICD-10-CM | POA: Diagnosis not present

## 2017-06-28 DIAGNOSIS — D1801 Hemangioma of skin and subcutaneous tissue: Secondary | ICD-10-CM | POA: Diagnosis not present

## 2017-06-28 DIAGNOSIS — M6281 Muscle weakness (generalized): Secondary | ICD-10-CM | POA: Diagnosis not present

## 2017-06-28 DIAGNOSIS — L570 Actinic keratosis: Secondary | ICD-10-CM | POA: Diagnosis not present

## 2017-06-28 NOTE — Progress Notes (Deleted)
Office Visit Note  Patient: Tina Hampton             Date of Birth: [birth year]           MRN: [id number]             PCP: Blanchie Serve, MD Referring: Jessy Oto, MD Visit Date: 07/03/2017 Occupation: @GUAROCC @    Subjective:  No chief complaint on file.   History of Present Illness: Tina Hampton is a 84 y.o. female ***   Activities of Daily Living:  Patient reports morning stiffness for *** {minute/hour:19697}.   Patient {ACTIONS;DENIES/REPORTS:21021675::"Denies"} nocturnal pain.  Difficulty dressing/grooming: {ACTIONS;DENIES/REPORTS:21021675::"Denies"} Difficulty climbing stairs: {ACTIONS;DENIES/REPORTS:21021675::"Denies"} Difficulty getting out of chair: {ACTIONS;DENIES/REPORTS:21021675::"Denies"} Difficulty using hands for taps, buttons, cutlery, and/or writing: {ACTIONS;DENIES/REPORTS:21021675::"Denies"}   No Rheumatology ROS completed.   PMFS History:  Patient Active Problem List   Diagnosis Date Noted  . Severe episode of recurrent major depressive disorder, without psychotic features ([place]) 05/03/2017  . GAD (generalized anxiety disorder) 05/03/2017  . Spinal stenosis of lumbar region with neurogenic claudication 03/01/2017  . Major depression, recurrent, chronic ([place]) 03/01/2017  . Primary osteoarthritis involving multiple joints 03/01/2017  . OAB (overactive bladder) 03/01/2017  . Rash 10/25/2016  . Hearing loss 10/25/2016  . Chronic bilateral low back pain without sciatica 08/17/2016  . Pain in joint, shoulder region 05/31/2016  . Sprain of anterior talofibular ligament 04/27/2016  . Pain of left scapula 10/20/2015  . Tremor 06/02/2015  . Vaginal stenosis 11/18/2014  . PAOD (peripheral arterial occlusive disease) ([place]) 11/18/2014  . Age-related osteoporosis without current pathological fracture 11/18/2014  . Hyperglycemia 11/18/2014  . Left inguinal hernia 04/24/2014  . Diverticulosis of colon with hemorrhage 04/24/2014  . Aortic ejection  murmur 04/24/2014  . Dysphagia 01/24/2014  . Esophageal stricture 01/24/2014  . Hip fracture ([place]) 11/07/2011  . GERD (gastroesophageal reflux disease)   . Hypertension   . Hyperlipidemia LDL goal <100   . Hiatal hernia   . Scoliosis   . DDD (degenerative disc disease)   . Depression   . Senile osteoporosis 12/14/2004  . Postmenopausal atrophic vaginitis 12/14/2004  . Edema 11/26/2004  . Urinary incontinence 11/26/2004    Past Medical History:  Diagnosis Date  . Abnormal mammogram, unspecified 04/19/2011  . Allergy   . Anxiety   . Backache, unspecified 01/21/2005  . Cervicalgia 08/20/2013  . Chronic kidney disease, stage II (mild) 04/03/2009  . Chronic rhinitis 09/28/2010  . DDD (degenerative disc disease)   . Depression   . Dermatophytosis of nail 01/21/2005  . Diverticulosis of colon (without mention of hemorrhage) 06/17/2005  . Edema 11/26/2004  . GERD (gastroesophageal reflux disease)   . Hiatal hernia   . High cholesterol   . Hyperglycemia 11/18/2014  . Hypertension   . Insomnia, unspecified 10/18/2011  . Left inguinal hernia 04/24/2014  . Nocturia 09/19/2008  . Osteoporosis, senile 11/18/2014  . Other abnormal blood chemistry 04/03/2007  . Pain in joint, pelvic region and thigh 11/12/2011  . Pain in joint, shoulder region 08/15/2006  . Pain in limb 08/26/2005  . Palpitations 09/29/2009  . PAOD (peripheral arterial occlusive disease) ([place]) 11/18/2014   Right leg; diminished popliteal, dorsalis pedis, and posterior tibial artery pulsations   . Postmenopausal atrophic vaginitis 12/14/2004  . Scoliosis   . Senile osteoporosis 12/14/2004  . Tension headache 10/04/2005  . Unspecified cataract 11/26/2004  . Unspecified constipation 01/21/2005  . Unspecified urinary incontinence 11/26/2004  . Vaginal stenosis 11/18/2014   Tight band about 1/2 inches into  the vagina which will not allow passage of my index finger.     Family History  Problem Relation Age of Onset  . Heart disease Mother   . Stroke  Father   . Heart disease Brother   . Esophageal cancer Neg Hx   . Stomach cancer Neg Hx   . Colon cancer Neg Hx    Past Surgical History:  Procedure Laterality Date  . ABDOMINAL HYSTERECTOMY  1969   Dr.Braun   . bilateral eyelid surgery  2005   Dr.Scott  . BILATERAL OOPHORECTOMY  2002  . BREAST SURGERY     benign tumor removal, left breast 1969  . CATARACT EXTRACTION     Right  . CHOLECYSTECTOMY  1990   Dr.Moore   . COLONOSCOPY  3/18/20008   inflammation, diverticular associated colitis -Dr. Ardis Hughs  . COSMETIC SURGERY    . ESOPHAGOGASTRODUODENOSCOPY N/A 01/24/2014   Procedure: ESOPHAGOGASTRODUODENOSCOPY (EGD);  Surgeon: Irene Shipper, MD;  Location[facility] ENDOSCOPY;  Service: Endoscopy;  Laterality: N/A;  . ESOPHAGOGASTRODUODENOSCOPY ENDOSCOPY  04/04/14   Dr. Ardis Hughs  . EYE SURGERY    . FOREIGN BODY REMOVAL N/A 01/24/2014   Procedure: FOREIGN BODY REMOVAL;  Surgeon: Irene Shipper, MD;  Location: [facility];  Service: Endoscopy;  Laterality: N/A;  . HIP PINNING,CANNULATED  11/07/2011   Procedure: CANNULATED HIP PINNING;  Surgeon: Jessy Oto, MD;  Location: [facility];  Service: Orthopedics;  Laterality: Left;  . ORIF FEMORAL NECK FRACTURE W/ [facility]  11/07/2011   Dr.Nitka   . TONSILLECTOMY     Dr.Joe Tamala Julian  . TUBAL LIGATION    . VAGINAL PROLAPSE REPAIR  2002   Dr.Horback    Social History   Social History Narrative   Lives at [facility] since 04/14/2005   Widowed (husband expired 2014)   Has Living Will, POA, MOST   Exercise: water aerobic  5 times a week   Walks with walker   Never smoked   Drinks moderate amount of caffeinate beverages daily   Alcohol none        Objective: Vital Signs: There were no vitals taken for this visit.   Physical Exam   Musculoskeletal Exam: ***  CDAI Exam: No CDAI exam completed.    Investigation: No additional findings. CBC Latest Ref Rng & Units 02/20/2017 10/17/2016 10/14/2016  WBC 3.8 - 10.8 Thousand/uL 6.4 8.8 19.0(H)    Hemoglobin 11.7 - 15.5 g/dL 13.3 11.8 11.0(L)  Hematocrit 35.0 - 45.0 % 40.1 36.1 33.7(L)  Platelets 140 - 400 Thousand/uL 275 426(H) 314   CMP Latest Ref Rng & Units 02/20/2017 10/17/2016 10/14/2016  Glucose 65 - 99 mg/dL 106(H) 172(H) 106(H)  BUN 7 - 25 mg/dL 15 13 21   Creatinine 0.60 - 0.88 mg/dL 0.94(H) 0.78 0.98(H)  Sodium 135 - 146 mmol/L 140 137 130(L)  Potassium 3.5 - 5.3 mmol/L 4.3 4.5 4.0  Chloride 98 - 110 mmol/L 103 100 95(L)  CO2 20 - 32 mmol/L 27 25 26   Calcium 8.6 - 10.4 mg/dL 10.0 9.6 8.7  Total Protein 6.1 - 8.1 g/dL 6.9 6.2 5.6(L)  Total Bilirubin 0.2 - 1.2 mg/dL 0.4 0.2 0.5  Alkaline Phos 33 - 130 U/L - 61 72  AST 10 - 35 U/L 19 25 38(H)  ALT 6 - 29 U/L 13 37(H) 48(H)    Imaging: Xr Thoracic Spine 2 View  Result Date: 06/06/2017 AP and lateral of the Thoracolumbar and sacrum show a significant scoliosis curve with right offset of L3  on L4 and L4 on L5.The areas of insufficiency fractures of the sacrum and right iliac wing is negative for displacene   Speciality Comments: No specialty comments available.    Procedures:  No procedures performed Allergies: Penicillins and Abilify [aripiprazole]   Assessment / Plan:     Visit Diagnoses: No diagnosis found.    Orders: No orders of the defined types were placed in this encounter.  No orders of the defined types were placed in this encounter.   Face-to-face time spent with patient was *** minutes. 50% of time was spent in counseling and coordination of care.  Follow-Up Instructions: No Follow-up on file.   Earnestine Mealing, CMA  Note - This record has been created using Editor, commissioning.  Chart creation errors have been sought, but may not always  have been located. Such creation errors do not reflect on  the standard of medical care.

## 2017-06-29 DIAGNOSIS — R2681 Unsteadiness on feet: Secondary | ICD-10-CM | POA: Diagnosis not present

## 2017-06-29 DIAGNOSIS — M6281 Muscle weakness (generalized): Secondary | ICD-10-CM | POA: Diagnosis not present

## 2017-07-03 ENCOUNTER — Ambulatory Visit: Payer: Medicare Other | Admitting: Rheumatology

## 2017-07-03 DIAGNOSIS — M6281 Muscle weakness (generalized): Secondary | ICD-10-CM | POA: Diagnosis not present

## 2017-07-03 DIAGNOSIS — R2681 Unsteadiness on feet: Secondary | ICD-10-CM | POA: Diagnosis not present

## 2017-07-04 ENCOUNTER — Encounter: Payer: Self-pay | Admitting: Family

## 2017-07-04 DIAGNOSIS — M6281 Muscle weakness (generalized): Secondary | ICD-10-CM | POA: Diagnosis not present

## 2017-07-04 DIAGNOSIS — R2681 Unsteadiness on feet: Secondary | ICD-10-CM | POA: Diagnosis not present

## 2017-07-04 NOTE — Progress Notes (Signed)
Opened in error

## 2017-07-06 ENCOUNTER — Encounter: Payer: Self-pay | Admitting: Family

## 2017-07-06 DIAGNOSIS — R2681 Unsteadiness on feet: Secondary | ICD-10-CM | POA: Diagnosis not present

## 2017-07-06 DIAGNOSIS — M6281 Muscle weakness (generalized): Secondary | ICD-10-CM | POA: Diagnosis not present

## 2017-07-06 DIAGNOSIS — F333 Major depressive disorder, recurrent, severe with psychotic symptoms: Secondary | ICD-10-CM | POA: Diagnosis not present

## 2017-07-06 NOTE — Progress Notes (Signed)
Opened in error

## 2017-07-07 ENCOUNTER — Encounter: Payer: Self-pay | Admitting: Family

## 2017-07-07 ENCOUNTER — Non-Acute Institutional Stay: Payer: Medicare Other | Admitting: Family

## 2017-07-07 DIAGNOSIS — K219 Gastro-esophageal reflux disease without esophagitis: Secondary | ICD-10-CM | POA: Diagnosis not present

## 2017-07-07 DIAGNOSIS — F339 Major depressive disorder, recurrent, unspecified: Secondary | ICD-10-CM | POA: Diagnosis not present

## 2017-07-07 DIAGNOSIS — I10 Essential (primary) hypertension: Secondary | ICD-10-CM | POA: Diagnosis not present

## 2017-07-07 DIAGNOSIS — G8929 Other chronic pain: Secondary | ICD-10-CM | POA: Diagnosis not present

## 2017-07-07 DIAGNOSIS — E785 Hyperlipidemia, unspecified: Secondary | ICD-10-CM | POA: Diagnosis not present

## 2017-07-07 DIAGNOSIS — M545 Low back pain, unspecified: Secondary | ICD-10-CM

## 2017-07-07 NOTE — Progress Notes (Signed)
Office Visit Note  Patient: Tina Hampton             Date of Birth: 25-Jul-1925           MRN: [id number]             PCP: Blanchie Serve, MD Referring: Jessy Oto, MD Visit Date: 07/10/2017 Occupation: @GUAROCC @    Subjective:  Osteoporosis (on Boniva )   History of Present Illness: Tina Hampton is a 84 y.o. female with history of osteoporosis seen in consultation per request of Dr. Louanne Skye.  According to patient she has had known history of osteoporosis for multiple years.  In November 2018 she started having lower back pain.  At that time x-ray performed showed that she had T12 insufficiency fracture and also left sacral ala fracture.  She was seen by Dr. Louanne Skye her pain improved.  Dr. Louanne Skye recommended more aggressive therapy for osteoporosis.  Patient recalls that she has taken Fosamax for several years in the past.  She was given 3 years ago which she has been taking only intermittently.  She states she resumed the [place] about 2 months ago.  She has been followed by her PCP for that.  She has been taking calcium and vitamin D.  Activities of Daily Living:  Patient reports morning stiffness for 0 minute.   Patient Denies nocturnal pain.  Difficulty dressing/grooming: Denies Difficulty climbing stairs: Reports Difficulty getting out of chair: Reports Difficulty using hands for taps, buttons, cutlery, and/or writing: Denies   Review of Systems  Constitutional: Negative for fatigue, night sweats, weight gain, weight loss and weakness.  HENT: Positive for mouth dryness. Negative for mouth sores, trouble swallowing, trouble swallowing and nose dryness.   Eyes: Positive for dryness. Negative for pain, redness and visual disturbance.  Respiratory: Negative for cough, shortness of breath and difficulty breathing.   Cardiovascular: Negative for chest pain, palpitations, hypertension, irregular heartbeat and swelling in legs/feet.  Gastrointestinal: Negative for blood in  stool, constipation and diarrhea.  Endocrine: Negative for increased urination.  Genitourinary: Negative for vaginal dryness.  Musculoskeletal: Negative for arthralgias, joint pain, joint swelling, myalgias, muscle weakness, morning stiffness, muscle tenderness and myalgias.  Skin: Negative for color change, rash, hair loss, skin tightness, ulcers and sensitivity to sunlight.  Allergic/Immunologic: Negative for susceptible to infections.  Neurological: Negative for dizziness, memory loss and night sweats.  Hematological: Negative for swollen glands.  Psychiatric/Behavioral: Positive for depressed mood. Negative for sleep disturbance. The patient is nervous/anxious.     PMFS History:  Patient Active Problem List   Diagnosis Date Noted  . Severe episode of recurrent major depressive disorder, without psychotic features ([place]) 05/03/2017  . GAD (generalized anxiety disorder) 05/03/2017  . Spinal stenosis of lumbar region with neurogenic claudication 03/01/2017  . Major depression, recurrent, chronic ([place]) 03/01/2017  . Primary osteoarthritis involving multiple joints 03/01/2017  . OAB (overactive bladder) 03/01/2017  . Rash 10/25/2016  . Hearing loss 10/25/2016  . Chronic bilateral low back pain without sciatica 08/17/2016  . Pain in joint, shoulder region 05/31/2016  . Sprain of anterior talofibular ligament 04/27/2016  . Pain of left scapula 10/20/2015  . Tremor 06/02/2015  . Vaginal stenosis 11/18/2014  . PAOD (peripheral arterial occlusive disease) ([place]) 11/18/2014  . Age-related osteoporosis without current pathological fracture 11/18/2014  . Hyperglycemia 11/18/2014  . Left inguinal hernia 04/24/2014  . Diverticulosis of colon with hemorrhage 04/24/2014  . Aortic ejection murmur 04/24/2014  . Dysphagia 01/24/2014  . Esophageal stricture 01/24/2014  .  Hip fracture ([place]) 11/07/2011  . GERD (gastroesophageal reflux disease)   . Hypertension   . Hyperlipidemia LDL goal <100   .  Hiatal hernia   . Scoliosis   . DDD (degenerative disc disease)   . Depression   . Senile osteoporosis 12/14/2004  . Postmenopausal atrophic vaginitis 12/14/2004  . Edema 11/26/2004  . Urinary incontinence 11/26/2004    Past Medical History:  Diagnosis Date  . Abnormal mammogram, unspecified 04/19/2011  . Allergy   . Anxiety   . Backache, unspecified 01/21/2005  . Cervicalgia 08/20/2013  . Chronic kidney disease, stage II (mild) 04/03/2009  . Chronic rhinitis 09/28/2010  . DDD (degenerative disc disease)   . Depression   . Dermatophytosis of nail 01/21/2005  . Diverticulosis of colon (without mention of hemorrhage) 06/17/2005  . Edema 11/26/2004  . GERD (gastroesophageal reflux disease)   . Hiatal hernia   . High cholesterol   . Hyperglycemia 11/18/2014  . Hypertension   . Insomnia, unspecified 10/18/2011  . Left inguinal hernia 04/24/2014  . Nocturia 09/19/2008  . Osteoporosis, senile 11/18/2014  . Other abnormal blood chemistry 04/03/2007  . Pain in joint, pelvic region and thigh 11/12/2011  . Pain in joint, shoulder region 08/15/2006  . Pain in limb 08/26/2005  . Palpitations 09/29/2009  . PAOD (peripheral arterial occlusive disease) ([place]) 11/18/2014   Right leg; diminished popliteal, dorsalis pedis, and posterior tibial artery pulsations   . Postmenopausal atrophic vaginitis 12/14/2004  . Scoliosis   . Senile osteoporosis 12/14/2004  . Tension headache 10/04/2005  . Unspecified cataract 11/26/2004  . Unspecified constipation 01/21/2005  . Unspecified urinary incontinence 11/26/2004  . Vaginal stenosis 11/18/2014   Tight band about 1/2 inches into the vagina which will not allow passage of my index finger.     Family History  Problem Relation Age of Onset  . Heart disease Mother   . Stroke Mother   . Emphysema Father   . Arthritis Father   . Alcohol abuse Brother   . Heart disease Son   . Leukemia Son   . Esophageal cancer Neg Hx   . Stomach cancer Neg Hx   . Colon cancer Neg Hx    Past  Surgical History:  Procedure Laterality Date  . ABDOMINAL HYSTERECTOMY  1969   Dr.Braun   . bilateral eyelid surgery  2005   Dr.Scott  . BILATERAL OOPHORECTOMY  2002  . BREAST SURGERY     benign tumor removal, left breast 1969  . CATARACT EXTRACTION     Right  . CHOLECYSTECTOMY  1990   Dr.Moore   . COLONOSCOPY  3/18/20008   inflammation, diverticular associated colitis -Dr. Ardis Hughs  . COSMETIC SURGERY    . ESOPHAGOGASTRODUODENOSCOPY N/A 01/24/2014   Procedure: ESOPHAGOGASTRODUODENOSCOPY (EGD);  Surgeon: Irene Shipper, MD;  Location: [facility] ENDOSCOPY;  Service: Endoscopy;  Laterality: N/A;  . ESOPHAGOGASTRODUODENOSCOPY ENDOSCOPY  04/04/14   Dr. Ardis Hughs  . EYE SURGERY    . FOREIGN BODY REMOVAL N/A 01/24/2014   Procedure: FOREIGN BODY REMOVAL;  Surgeon: Irene Shipper, MD;  Location: [facility];  Service: Endoscopy;  Laterality: N/A;  . HIP PINNING,CANNULATED  11/07/2011   Procedure: CANNULATED HIP PINNING;  Surgeon: Jessy Oto, MD;  Location: [facility];  Service: Orthopedics;  Laterality: Left;  . ORIF FEMORAL NECK FRACTURE W/ [facility]  11/07/2011   Dr.Nitka   . TONSILLECTOMY     Dr.Joe Tamala Julian  . TUBAL LIGATION    . VAGINAL PROLAPSE REPAIR  2002   Dr.Horback  Social History   Social History Narrative   Lives at [facility] since 04/14/2005   Widowed (husband expired 2014)   Has Living Will, POA, MOST   Exercise: water aerobic  5 times a week   Walks with walker   Never smoked   Drinks moderate amount of caffeinate beverages daily   Alcohol none        Objective: Vital Signs: BP 120/78 (BP Location: Left Arm, Patient Position: Sitting, Cuff Size: Normal)   Pulse 86   Resp 16   Ht 5\' 2"  (1.575 m)   Wt 149 lb (67.6 kg)   BMI 27.25 kg/m    Physical Exam  Constitutional: She is oriented to person, place, and time. She appears well-developed and well-nourished.  HENT:  Head: Normocephalic and atraumatic.  Eyes: Conjunctivae and EOM are normal.  Neck: Normal range of  motion.  Cardiovascular: Normal rate, regular rhythm, normal heart sounds and intact distal pulses.  Pulmonary/Chest: Effort normal and breath sounds normal.  Abdominal: Soft. Bowel sounds are normal.  Musculoskeletal:  Uses walker  Lymphadenopathy:    She has no cervical adenopathy.  Neurological: She is alert and oriented to person, place, and time.  Skin: Skin is warm and dry. Capillary refill takes less than 2 seconds.  Psychiatric: She has a normal mood and affect. Her behavior is normal.  Nursing note and vitals reviewed.    Musculoskeletal Exam: Patient mobilizes with the help of a walker.  She has severe kyphosis.  She has limited range of motion of her left shoulder.  She has some DIP PIP thickening in her hands consistent with osteoarthritis.  She is some discomfort in the lumbar region.  Hip joints knee joints ankles with good range of motion.  CDAI Exam: No CDAI exam completed.    Investigation: No additional findings. CBC Latest Ref Rng & Units 02/20/2017 10/17/2016 10/14/2016  WBC 3.8 - 10.8 Thousand/uL 6.4 8.8 19.0(H)  Hemoglobin 11.7 - 15.5 g/dL 13.3 11.8 11.0(L)  Hematocrit 35.0 - 45.0 % 40.1 36.1 33.7(L)  Platelets 140 - 400 Thousand/uL 275 426(H) 314   CMP Latest Ref Rng & Units 02/20/2017 10/17/2016 10/14/2016  Glucose 65 - 99 mg/dL 106(H) 172(H) 106(H)  BUN 7 - 25 mg/dL 15 13 21   Creatinine 0.60 - 0.88 mg/dL 0.94(H) 0.78 0.98(H)  Sodium 135 - 146 mmol/L 140 137 130(L)  Potassium 3.5 - 5.3 mmol/L 4.3 4.5 4.0  Chloride 98 - 110 mmol/L 103 100 95(L)  CO2 20 - 32 mmol/L 27 25 26   Calcium 8.6 - 10.4 mg/dL 10.0 9.6 8.7  Total Protein 6.1 - 8.1 g/dL 6.9 6.2 5.6(L)  Total Bilirubin 0.2 - 1.2 mg/dL 0.4 0.2 0.5  Alkaline Phos 33 - 130 U/L - 61 72  AST 10 - 35 U/L 19 25 38(H)  ALT 6 - 29 U/L 13 37(H) 48(H)    Imaging: No results found.  Speciality Comments: No specialty comments available.    Procedures:  No procedures performed Allergies: Penicillins and Abilify  [aripiprazole]   Assessment / Plan:     Visit Diagnoses: Age-related osteoporosis without current pathological fracture - bonivaDEXA 12/20/2016 T-score: -2.6.  Patient has taken intermittent Fosamax and Boniva.  She just restarted Boniva about 2 months ago.  Her bone density in 2018 showed improvement of 6% in her right femur.  There was no comparison on lumbar spine or radius.  She had recent pathological fracture in November 2018 for which she was evaluated by Dr. Louanne Skye.  He recommended more aggressive therapy due to recent fracture.  I detailed discussion with the patient regarding different treatment options and their side effects.  After reviewing indication side effects contraindications we discussed the use of Forteo which will improve her bone mass.  She is taking calcium and vitamin D.  When she start Forteo if it is approved she will discontinue Boniva.  Patient states that the cost is too high for Forteo then she may want to continue on the current treatment.  We also discussed the option of IV Reclast or subcu Prolia.  As compliance with Boniva also has been an issue. I will obtain TSH, intact PTH, SPEP and vitamin D levels.  Labs will be drawn at the facility she states.   Chronic bilateral low back pain without sciatica: Followed by Dr. Louanne Skye  Spinal stenosis of lumbar region with neurogenic claudication: Chronic pain  Primary osteoarthritis involving multiple joints: She has some discomfort. History of hypertension  History of anxiety  History of depression  Bilateral hearing loss, unspecified hearing loss type  History of gastroesophageal reflux (GERD)  History of hyperlipidemia  History of diverticulosis    Orders: No orders of the defined types were placed in this encounter.  No orders of the defined types were placed in this encounter.   Face-to-face time spent with patient was 50 minutes.  Greater than 50% of time was spent in counseling and coordination of  care.  Follow-Up Instructions: Return for Osteoporosis.   Bo Merino, MD  Note - This record has been created using Editor, commissioning.  Chart creation errors have been sought, but may not always  have been located. Such creation errors do not reflect on  the standard of medical care.

## 2017-07-07 NOTE — Progress Notes (Signed)
Location:  [place] [facility] Room Number: 8 Place of Service:  ALF [phone]) Provider: Dinah Ngetich FNP-C   Blanchie Serve, MD  Patient Care Team: Blanchie Serve, MD as PCP - General (Internal Medicine) Melina Modena, [facility] Clent Jacks, MD as Consulting Physician (Ophthalmology) Milus Banister, MD as Consulting Physician (Gastroenterology) Norma Fredrickson, MD as Consulting Physician (Psychiatry) Jessy Oto, MD as Consulting Physician (Orthopedic Surgery) Bjorn Loser, MD as Consulting Physician (Urology)  Extended Emergency Contact Information Primary Emergency Contact: [address] [place], [place] [address] Johnnette Litter of [place] Phone: [phone] Mobile Phone: [phone] Relation: Son  Code Status: DNR  Goals of care: Advanced Directive information Advanced Directives 07/07/2017  Does Patient Have a Medical Advance Directive? Yes  Type of Paramedic of [place];Out of facility DNR (pink MOST or yellow form);Living will  Does patient want to make changes to medical advance directive? -  Copy of [place] in Chart? Yes  Pre-existing out of facility DNR order (yellow form or pink MOST form) Pink MOST form placed in chart (order not valid for inpatient use)     Chief Complaint  Patient presents with  . Medical Management of Chronic Issues    Routine visit    HPI:  Pt is a 84 y.o. female seen today [place] for medical management of chronic diseases.she has a medical history of HTN,Hyperlipidemia,depression,DDD,OA,Osteoporosis,scoliosis,GERD among other conditions.she is seen in her room today with facility Nurse at bedside.she states has a raspy voice for the past several days.she states seems to be getting better.she denies any fever,chills,cough or sore throat.she has had no recent fall episode.she was seen by [place] 06/05/2017 for osteoporosis and scoliosis Caltrate one  tablet twice daily initiated.Patient to continue on Boniva.Patient also to resume pool exercise for back pain with Physical Therapy until patient is independent .     Past Medical History:  Diagnosis Date  . Abnormal mammogram, unspecified 04/19/2011  . Allergy   . Anxiety   . Backache, unspecified 01/21/2005  . Cervicalgia 08/20/2013  . Chronic kidney disease, stage II (mild) 04/03/2009  . Chronic rhinitis 09/28/2010  . DDD (degenerative disc disease)   . Depression   . Dermatophytosis of nail 01/21/2005  . Diverticulosis of colon (without mention of hemorrhage) 06/17/2005  . Edema 11/26/2004  . GERD (gastroesophageal reflux disease)   . Hiatal hernia   . High cholesterol   . Hyperglycemia 11/18/2014  . Hypertension   . Insomnia, unspecified 10/18/2011  . Left inguinal hernia 04/24/2014  . Nocturia 09/19/2008  . Osteoporosis, senile 11/18/2014  . Other abnormal blood chemistry 04/03/2007  . Pain in joint, pelvic region and thigh 11/12/2011  . Pain in joint, shoulder region 08/15/2006  . Pain in limb 08/26/2005  . Palpitations 09/29/2009  . PAOD (peripheral arterial occlusive disease) ([place]) 11/18/2014   Right leg; diminished popliteal, dorsalis pedis, and posterior tibial artery pulsations   . Postmenopausal atrophic vaginitis 12/14/2004  . Scoliosis   . Senile osteoporosis 12/14/2004  . Tension headache 10/04/2005  . Unspecified cataract 11/26/2004  . Unspecified constipation 01/21/2005  . Unspecified urinary incontinence 11/26/2004  . Vaginal stenosis 11/18/2014   Tight band about 1/2 inches into the vagina which will not allow passage of my index finger.    Past Surgical History:  Procedure Laterality Date  . ABDOMINAL HYSTERECTOMY  1969   Dr.Braun   . bilateral eyelid surgery  2005  Dr.Scott  . BILATERAL OOPHORECTOMY  2002  . BREAST SURGERY     benign tumor removal, left breast 1969  . CATARACT EXTRACTION     Right  . CHOLECYSTECTOMY  1990   Dr.Moore   . COLONOSCOPY  3/18/20008    inflammation, diverticular associated colitis -Dr. Ardis Hughs  . COSMETIC SURGERY    . ESOPHAGOGASTRODUODENOSCOPY N/A 01/24/2014   Procedure: ESOPHAGOGASTRODUODENOSCOPY (EGD);  Surgeon: Irene Shipper, MD;  Location: [facility] ENDOSCOPY;  Service: Endoscopy;  Laterality: N/A;  . ESOPHAGOGASTRODUODENOSCOPY ENDOSCOPY  04/04/14   Dr. Ardis Hughs  . EYE SURGERY    . FOREIGN BODY REMOVAL N/A 01/24/2014   Procedure: FOREIGN BODY REMOVAL;  Surgeon: Irene Shipper, MD;  Location: [place];  Service: Endoscopy;  Laterality: N/A;  . HIP PINNING,CANNULATED  11/07/2011   Procedure: CANNULATED HIP PINNING;  Surgeon: Jessy Oto, MD;  Location: [facility] ORS;  Service: Orthopedics;  Laterality: Left;  . ORIF FEMORAL NECK FRACTURE W/ [facility]  11/07/2011   Dr.Nitka   . TONSILLECTOMY     Dr.Joe Tamala Julian  . TUBAL LIGATION    . VAGINAL PROLAPSE REPAIR  2002   Dr.Horback     Allergies  Allergen Reactions  . Penicillins Other (See Comments)    CHILDHOOD REACTION  . Abilify [Aripiprazole] Rash    Allergies as of 07/07/2017      Reactions   Penicillins Other (See Comments)   CHILDHOOD REACTION   Abilify [aripiprazole] Rash      Medication List        Accurate as of 07/07/17  3:42 PM. Always use your most recent med list.          acetaminophen-codeine 300-30 MG tablet Commonly known as:  TYLENOL #3 Take 1 tablet by mouth every 6 (six) hours as needed for moderate pain.   antiseptic oral rinse Liqd 15 mLs by Mouth Rinse route every 4 (four) hours as needed for dry mouth.   aspirin 81 MG tablet Take 81 mg by mouth daily.   BIOFREEZE EX Apply 1 application topically daily as needed.   buPROPion 150 MG 24 hr tablet Commonly known as:  WELLBUTRIN XL Take 450 mg by mouth daily.   CALTRATE 600+D 600-800 MG-UNIT Tabs Generic drug:  Calcium Carb-Cholecalciferol Take 1 tablet by mouth 2 (two) times daily.   cholecalciferol 1000 units tablet Commonly known as:  VITAMIN D Take 1,000 Units by mouth daily.   clonazePAM  0.5 MG tablet Commonly known as:  KLONOPIN Take 0.25 mg by mouth 2 (two) times daily. 8 am and 2 pm.   clonazePAM 0.5 MG tablet Commonly known as:  KLONOPIN Take 0.25 mg by mouth daily as needed for anxiety (in addition to the scheduled dose).   escitalopram 20 MG tablet Commonly known as:  LEXAPRO Take 30 mg by mouth daily.   Glucosamine HCl 1000 MG Tabs Take 2,000 mg by mouth 2 (two) times daily.   ibandronate 150 MG tablet Commonly known as:  BONIVA Take 1 tablet (150 mg total) by mouth every 30 (thirty) days. Take in the morning with a full glass of water, on an empty stomach, and do not take anything else by mouth or lie down for the next 30 min.   meloxicam 7.5 MG tablet Commonly known as:  MOBIC TAKE 1 TABLET BY MOUTH EVERY DAY AS NEEDED FOR ARTHRITIS   mirtazapine 30 MG tablet Commonly known as:  REMERON Take 30 mg by mouth at bedtime.   multivitamin with minerals Tabs tablet Take  1 tablet by mouth daily.   NIFEdipine 60 MG 24 hr tablet Commonly known as:  PROCARDIA XL/ADALAT-CC TAKE 1 TABLET BY MOUTH EVERY DAY TO CONTROL BLOOD PRESSURE   pantoprazole 40 MG tablet Commonly known as:  PROTONIX One daily to reduce stomach acid   polyethylene glycol packet Commonly known as:  MIRALAX / GLYCOLAX Take 17 g by mouth daily.   pravastatin 10 MG tablet Commonly known as:  PRAVACHOL Take 10 mg by mouth daily.   SYSTANE 0.4-0.3 % Soln Generic drug:  Polyethyl Glycol-Propyl Glycol Place 1 drop into both eyes 2 (two) times daily.   TUSSIONEX PENNKINETIC ER 10-8 MG/5ML Suer Generic drug:  chlorpheniramine-HYDROcodone Take 5 mLs by mouth every 12 (twelve) hours as needed for cough.       Review of Systems  Constitutional: Negative for chills, fatigue and fever.  HENT: Negative for congestion, rhinorrhea, sinus pressure, sinus pain, sneezing and sore throat.        Has had Raspy voice for several days but states has improved  Eyes: Positive for visual disturbance.  Negative for discharge and redness.       Dry eyes follows up with Ophthalmology last seen 06/22/2017  Respiratory: Negative for cough, chest tightness, shortness of breath and wheezing.   Cardiovascular: Positive for leg swelling. Negative for chest pain and palpitations.  Gastrointestinal: Negative for abdominal distention, abdominal pain, constipation, diarrhea, nausea and vomiting.  Endocrine: Negative for cold intolerance, heat intolerance, polydipsia, polyphagia and polyuria.  Genitourinary: Negative for dysuria, frequency and urgency.  Musculoskeletal: Positive for arthralgias, back pain and gait problem.  Skin: Negative for color change, pallor, rash and wound.  Neurological: Negative for dizziness, syncope, light-headedness and headaches.  Hematological: Does not bruise/bleed easily.  Psychiatric/Behavioral: Negative for agitation, confusion and sleep disturbance. The patient is not nervous/anxious.     Immunization History  Administered Date(s) Administered  . Influenza Whole 02/14/2012, 02/14/2013  . Influenza-Unspecified 02/27/2014, 02/12/2015, 02/25/2016, 03/15/2017  . Pneumococcal Conjugate-13 07/13/2015  . Pneumococcal Polysaccharide-23 05/16/1998  . Td 05/16/2004  . Zoster 05/16/2005   Pertinent  Health Maintenance Due  Topic Date Due  . MAMMOGRAM  12/20/2017  . INFLUENZA VACCINE  Completed  . DEXA SCAN  Completed  . PNA vac Low Risk Adult  Completed   Fall Risk  04/21/2017 10/17/2016 10/14/2016 10/13/2016 05/31/2016  Falls in the past year? No No No No No    Vitals:   07/07/17 1130  BP: (!) 108/56  Pulse: 83  Resp: 16  Temp: 98.4 F (36.9 C)  SpO2: 92%  Weight: 141 lb (64 kg)  Height: 5\' 2"  (1.575 m)   Body mass index is 25.79 kg/m. Physical Exam  Constitutional: She is oriented to person, place, and time. She appears well-developed and well-nourished. No distress.  Elderly in no acute distress   HENT:  Head: Normocephalic and atraumatic.  Right Ear:  External ear normal.  Left Ear: External ear normal.  Mouth/Throat: Oropharynx is clear and moist.  Eyes: Conjunctivae and EOM are normal. Pupils are equal, round, and reactive to light. Right eye exhibits no discharge. Left eye exhibits no discharge. No scleral icterus.  Wears eye glasses   Neck: Normal range of motion. No JVD present. No thyromegaly present.  Cardiovascular: Normal rate, regular rhythm, normal heart sounds and intact distal pulses. Exam reveals no gallop and no friction rub.  No murmur heard. Pulmonary/Chest: Effort normal and breath sounds normal. No respiratory distress. She has no wheezes. She has no rales.  Abdominal: Soft. Bowel sounds are normal. She exhibits no distension. There is no tenderness. There is no rebound and no guarding.  Genitourinary:  Genitourinary Comments: Continent   Musculoskeletal: She exhibits no tenderness.  Moves x 4 extremities uses FWW and wheelchair for long distances.Bilateral trace edema to lower extremities.Knee high ted hose off during visit. Scoliosis   Lymphadenopathy:    She has no cervical adenopathy.  Neurological: She is alert and oriented to person, place, and time. Coordination normal.  Skin: Skin is warm and dry. No rash noted. She is not diaphoretic. No erythema. No pallor.  Psychiatric: She has a normal mood and affect. Her behavior is normal.   Labs reviewed: Recent Labs    10/14/16 1337 10/17/16 1512 02/20/17 0750  NA 130* 137 140  K 4.0 4.5 4.3  CL 95* 100 103  CO2 26 25 27   GLUCOSE 106* 172* 106*  BUN 21 13 15   CREATININE 0.98* 0.78 0.94*  CALCIUM 8.7 9.6 10.0   Recent Labs    10/14/16 1337 10/17/16 1512 02/20/17 0750  AST 38* 25 19  ALT 48* 37* 13  ALKPHOS 72 61  --   BILITOT 0.5 0.2 0.4  PROT 5.6* 6.2 6.9  ALBUMIN 3.3* 3.6  --    Recent Labs    10/14/16 1337 10/17/16 1512 02/20/17 0750  WBC 19.0* 8.8 6.4  NEUTROABS  --  7,480 4,205  HGB 11.0* 11.8 13.3  HCT 33.7* 36.1 40.1  MCV 91.8 92.3  88.9  PLT 314 426* 275   Lab Results  Component Value Date   TSH 2.90 05/21/2015    Lab Results  Component Value Date   CHOL 174 02/20/2017   HDL 63 02/20/2017   LDLCALC 90 05/23/2016   TRIG 123 02/20/2017   CHOLHDL 2.8 02/20/2017    Significant Diagnostic Results in last 30 days:  No results found.  Assessment/Plan 1. Hyperlipidemia LDL goal <100 Last LDL at goal.Continue on pravastatin 10 mg tablet daily.Obtain fasting lipid panel 07/10/2017.    2. Chronic bilateral low back pain without sciatica Seen by [facility] 06/05/2017 for osteoporosis and scoliosis Caltrate one tablet twice daily initiated.Patient to continue on Boniva.Patient also to resume pool exercise for back pain with Physical Therapy until patient is independent .Patient scheduled to start pool exercise today.continue on Meloxicam 7.5 mg tablet daily as needed.      3. Gastroesophageal reflux disease without esophagitis Stable.continue on Protonix 40 mg tablet daily.continue to monitor.    4. Major depression, recurrent, chronic  Mood stable.continue on Lexapro 30 mg tablet daily,Wellbutrin XL450 mg tablet daily,Remeron 30 mg tablet at bedtime and Clonazepam 0.5 mg tablet.continue to monitor for mood changes.continue to follow up with Psychiatry service. Obtain TSH level 07/10/2017   5. Hypertension  B/p stable.Continue on Nefedinpine XL  60 mg tablet  Daily.On ASA for prophylaxis. Continue to monitor. Check CBC/diff and CMP 07/10/2017.   Family/ staff Communication: Reviewed plan of care with patient and facility Nurse.  Labs/tests ordered: CBC/diff,CMP, TSH level and fasting lipid panel  07/10/2017.  Sandrea Hughs, NP

## 2017-07-10 ENCOUNTER — Encounter: Payer: Self-pay | Admitting: Rheumatology

## 2017-07-10 ENCOUNTER — Telehealth: Payer: Self-pay

## 2017-07-10 ENCOUNTER — Ambulatory Visit (INDEPENDENT_AMBULATORY_CARE_PROVIDER_SITE_OTHER): Payer: Medicare Other | Admitting: Rheumatology

## 2017-07-10 ENCOUNTER — Encounter: Payer: Self-pay | Admitting: *Deleted

## 2017-07-10 VITALS — BP 120/78 | HR 86 | Resp 16 | Ht 62.0 in | Wt 149.0 lb

## 2017-07-10 DIAGNOSIS — Z8659 Personal history of other mental and behavioral disorders: Secondary | ICD-10-CM | POA: Diagnosis not present

## 2017-07-10 DIAGNOSIS — M150 Primary generalized (osteo)arthritis: Secondary | ICD-10-CM

## 2017-07-10 DIAGNOSIS — M48062 Spinal stenosis, lumbar region with neurogenic claudication: Secondary | ICD-10-CM | POA: Diagnosis not present

## 2017-07-10 DIAGNOSIS — Z8639 Personal history of other endocrine, nutritional and metabolic disease: Secondary | ICD-10-CM

## 2017-07-10 DIAGNOSIS — H9193 Unspecified hearing loss, bilateral: Secondary | ICD-10-CM

## 2017-07-10 DIAGNOSIS — G8929 Other chronic pain: Secondary | ICD-10-CM | POA: Diagnosis not present

## 2017-07-10 DIAGNOSIS — M810 Age-related osteoporosis without current pathological fracture: Secondary | ICD-10-CM | POA: Diagnosis not present

## 2017-07-10 DIAGNOSIS — E559 Vitamin D deficiency, unspecified: Secondary | ICD-10-CM | POA: Diagnosis not present

## 2017-07-10 DIAGNOSIS — Z8719 Personal history of other diseases of the digestive system: Secondary | ICD-10-CM | POA: Diagnosis not present

## 2017-07-10 DIAGNOSIS — Z8679 Personal history of other diseases of the circulatory system: Secondary | ICD-10-CM

## 2017-07-10 DIAGNOSIS — M545 Low back pain: Secondary | ICD-10-CM

## 2017-07-10 DIAGNOSIS — Z79899 Other long term (current) drug therapy: Secondary | ICD-10-CM | POA: Diagnosis not present

## 2017-07-10 DIAGNOSIS — M159 Polyosteoarthritis, unspecified: Secondary | ICD-10-CM

## 2017-07-10 LAB — COMPLETE METABOLIC PANEL WITHOUT GFR
ALT: 13
AST: 18
Albumin: 3.5
Alkaline Phosphatase: 60
BUN: 21 (ref 4–21)
Calcium: 9.3
Creat: 0.89
Glucose: 104
Potassium: 4.2
Sodium: 143
Total Bilirubin: 0.2
Total Protein: 5.8 g/dL

## 2017-07-10 LAB — CBC AND DIFFERENTIAL
HCT: 34 — AB (ref 36–46)
Hemoglobin: 11.6 — AB (ref 12.0–16.0)
Platelets: 180 (ref 150–399)
WBC: 5.1

## 2017-07-10 LAB — TSH
TSH: 1.67
TSH: 1.67 (ref 0.41–5.90)

## 2017-07-10 LAB — CBC
HCT: 34
HGB: 11.6
WBC: 5.1
platelet count: 180

## 2017-07-10 LAB — BASIC METABOLIC PANEL
Creatinine: 0.9 (ref 0.5–1.1)
Glucose: 104
POTASSIUM: 4.2 (ref 3.4–5.3)
SODIUM: 143 (ref 137–147)

## 2017-07-10 LAB — HEPATIC FUNCTION PANEL
ALT: 13 (ref 7–35)
AST: 18 (ref 13–35)
Alkaline Phosphatase: 60 (ref 25–125)
Bilirubin, Total: 0.2

## 2017-07-10 NOTE — Telephone Encounter (Signed)
Patient advised she does not need to keep appointment if she is going to continue the [place]. Patient verbalized  understanding.

## 2017-07-10 NOTE — Patient Instructions (Signed)
Teriparatide injection What is this medicine? TERIPARATIDE (terr ih PAR a tyd) increases bone mass and strength. It helps make healthy bone and to slow bone loss. This medicine is used to prevent bone fractures. This medicine may be used for other purposes; ask your health care provider or pharmacist if you have questions. COMMON BRAND NAME(S): Forteo What should I tell my health care provider before I take this medicine? They need to know if you have any of these conditions: -bone disease other than osteoporosis -high levels of calcium in the blood -history of cancer in the bone -kidney stone -Paget's disease -parathyroid disease -receiving radiation therapy -an unusual or allergic reaction to teriparatide, other medicines, foods, dyes, or preservatives -pregnant or trying to get pregnant -breast-feeding How should I use this medicine? This medicine is for injection under the skin. You will be taught how to prepare and give this medicine. Use exactly as directed. Take your medicine at regular intervals. Do not take your medicine more often than directed. It is important that you put your used needles and pens in a special sharps container. Do not put them in a trash can. If you do not have a sharps container, call your pharmacist or health care provider to get one. A special MedGuide will be given to you by the pharmacist with each prescription and refill. Be sure to read this information carefully each time. Talk to your pediatrician regarding the use of this medicine in children. Special care may be needed. Overdosage: If you think you have taken too much of this medicine contact a poison control center or emergency room at once. NOTE: This medicine is only for you. Do not share this medicine with others. What if I miss a dose? If you miss a dose, take it as soon as you can. If it is almost time for your next dose, take only that dose. Do not take double or extra doses. What may interact  with this medicine? -digoxin This list may not describe all possible interactions. Give your health care provider a list of all the medicines, herbs, non-prescription drugs, or dietary supplements you use. Also tell them if you smoke, drink alcohol, or use illegal drugs. Some items may interact with your medicine. What should I watch for while using this medicine? Visit your doctor or health care professional for regular checks on your progress. Your doctor may order blood tests and other tests to see how you are doing. You should make sure you get enough calcium and vitamin D while you are taking this medicine, unless your doctor tells you not to. Discuss the foods you eat and the vitamins you take with your health care professional. Dennis Bast may get drowsy or dizzy. Do not drive, use machinery, or do anything that needs mental alertness until you know how this medicine affects you. Do not stand or sit up quickly, especially if you are an older patient. This reduces the risk of dizzy or fainting spells. Talk to your doctor about your risk of cancer. You may be more at risk for certain types of cancers if you take this medicine. What side effects may I notice from receiving this medicine? Side effects that you should report to your doctor or health care professional as soon as possible: -allergic reactions like skin rash, itching or hives, swelling of the face, lips, or tongue -blood in the urine; pain in the lower back or side; pain when urinating -signs and symptoms of low blood pressure like dizziness;  feeling faint or lightheaded, falls; unusually weak or tired -signs and symptoms of increased calcium like nausea; vomiting; constipation; low energy; or muscle weakness Side effects that usually do not require medical attention (report these to your doctor or health care professional if they continue or are bothersome): -headache -joint pain -nausea -pain, redness, irritation or swelling at the  injection site -stomach upset This list may not describe all possible side effects. Call your doctor for medical advice about side effects. You may report side effects to FDA at 1-800-FDA-1088. Where should I keep my medicine? Keep out of the reach of children. Store the pens in a refrigerator between 2 and 8 degrees C (36 and 46 degrees F). Do not freeze. Use the pen quickly after taking out of the refrigerator and recap and return to refrigerator right after using. Protect from light. Throw away any unused medicine 28 days after the first injection from the pen. Throw away any unused medicine after the expiration date on the label. NOTE: This sheet is a summary. It may not cover all possible information. If you have questions about this medicine, talk to your doctor, pharmacist, or health care provider.  2018 Elsevier/Gold Standard (2015-09-21 10:23:57)

## 2017-07-10 NOTE — Telephone Encounter (Signed)
If she wants to continue Boniva and does not want to change her treatment then she does not need to keep her follow-up appointment.

## 2017-07-10 NOTE — Telephone Encounter (Signed)
-----   Message from Carole Binning, LPN sent at 09/14/5866 10:19 AM EST ----- Regarding: Please apply for Forteo Please apply for Forteo. Thanks!

## 2017-07-10 NOTE — Telephone Encounter (Signed)
Called pts insurance [employer] [phone]) to submit prior authorization. (Cover my meds would not process). Spoke with [place] who submitted PA over the phone. Simon Rhein has been approved. An approval letter with the dates will be faxed to the clinic within 1-2 business days.  Reference number: [id number]  Called pt to update. Patient states that she is not sure if she wants to start Forteo at this time. She would like to hold off for now. She questions if she needs to keep her appointment that is scheduled in one month (08/07/17) Please advise.Thanks!  Teniola Tseng, [place], CPhT 4:00 PM

## 2017-07-13 DIAGNOSIS — M1990 Unspecified osteoarthritis, unspecified site: Secondary | ICD-10-CM | POA: Diagnosis not present

## 2017-07-13 DIAGNOSIS — Z79899 Other long term (current) drug therapy: Secondary | ICD-10-CM | POA: Diagnosis not present

## 2017-07-13 DIAGNOSIS — E039 Hypothyroidism, unspecified: Secondary | ICD-10-CM | POA: Diagnosis not present

## 2017-07-13 DIAGNOSIS — E559 Vitamin D deficiency, unspecified: Secondary | ICD-10-CM | POA: Diagnosis not present

## 2017-07-13 DIAGNOSIS — R600 Localized edema: Secondary | ICD-10-CM | POA: Diagnosis not present

## 2017-07-13 LAB — VITAMIN D 25 HYDROXY (VIT D DEFICIENCY, FRACTURES): VIT D 25 HYDROXY: 61

## 2017-07-26 DIAGNOSIS — L814 Other melanin hyperpigmentation: Secondary | ICD-10-CM | POA: Diagnosis not present

## 2017-07-26 DIAGNOSIS — L570 Actinic keratosis: Secondary | ICD-10-CM | POA: Diagnosis not present

## 2017-07-28 ENCOUNTER — Non-Acute Institutional Stay: Payer: Medicare Other | Admitting: Family

## 2017-07-28 DIAGNOSIS — I10 Essential (primary) hypertension: Secondary | ICD-10-CM | POA: Diagnosis not present

## 2017-07-28 DIAGNOSIS — Z7189 Other specified counseling: Secondary | ICD-10-CM

## 2017-07-28 NOTE — Progress Notes (Signed)
Location:  [place] Room Number: 8 Place of Service:  ALF [phone]) Provider: Dinah Ngetich FNP-C  Blanchie Serve, MD  Patient Care Team: Blanchie Serve, MD as PCP - General (Internal Medicine) Melina Modena, [facility] Clent Jacks, MD as Consulting Physician (Ophthalmology) Milus Banister, MD as Consulting Physician (Gastroenterology) Norma Fredrickson, MD as Consulting Physician (Psychiatry) Jessy Oto, MD as Consulting Physician (Orthopedic Surgery) Bjorn Loser, MD as Consulting Physician (Urology)  Extended Emergency Contact Information Primary Emergency Contact: [address] [place], [place] [address] Johnnette Litter of [place] Phone: [phone] Mobile Phone: [phone] Relation: Son  Code Status:  DNR Goals of care: Advanced Directive information Advanced Directives 07/07/2017  Does Patient Have a Medical Advance Directive? Yes  Type of Paramedic of [place];Out of facility DNR (pink MOST or yellow form);Living will  Does patient want to make changes to medical advance directive? -  Copy of [place] in Chart? Yes  Pre-existing out of facility DNR order (yellow form or pink MOST form) Pink MOST form placed in chart (order not valid for inpatient use)     Chief Complaint  Patient presents with  . Acute Visit    MOST Form update,High blood pressure     HPI:  Pt is a 84 y.o. female seen today at [facility] for an acute visit for evaluation of high blood pressure and update MOST Form.she is seen in her room today with facility House coordinator present at bedside.she denies any headache,dizziness,shortness of breath or chest pain.   Patient's MOST form from 11/14/2011 reviewed with patient in the presence of [place].Patient's current MOST form indicates attempt CPR in case of Cardiopulmonary event, full scope of treatment including transfer to the  hospital.Also indicates antibiotics if life can be prolonged.Administer IVF and feeding Tube for long term if indicated.After MOST form was reviewed with patient and facility House coordinator,patient wishes to continue with current MOST form except change Tube feeding from long term if indicated to a define trial period.     Past Medical History:  Diagnosis Date  . Abnormal mammogram, unspecified 04/19/2011  . Allergy   . Anxiety   . Backache, unspecified 01/21/2005  . Cervicalgia 08/20/2013  . Chronic kidney disease, stage II (mild) 04/03/2009  . Chronic rhinitis 09/28/2010  . DDD (degenerative disc disease)   . Depression   . Dermatophytosis of nail 01/21/2005  . Diverticulosis of colon (without mention of hemorrhage) 06/17/2005  . Edema 11/26/2004  . GERD (gastroesophageal reflux disease)   . Hiatal hernia   . High cholesterol   . Hyperglycemia 11/18/2014  . Hypertension   . Insomnia, unspecified 10/18/2011  . Left inguinal hernia 04/24/2014  . Nocturia 09/19/2008  . Osteoporosis, senile 11/18/2014  . Other abnormal blood chemistry 04/03/2007  . Pain in joint, pelvic region and thigh 11/12/2011  . Pain in joint, shoulder region 08/15/2006  . Pain in limb 08/26/2005  . Palpitations 09/29/2009  . PAOD (peripheral arterial occlusive disease) ([place]) 11/18/2014   Right leg; diminished popliteal, dorsalis pedis, and posterior tibial artery pulsations   . Postmenopausal atrophic vaginitis 12/14/2004  . Scoliosis   . Senile osteoporosis 12/14/2004  . Tension headache 10/04/2005  . Unspecified cataract 11/26/2004  . Unspecified constipation 01/21/2005  . Unspecified urinary incontinence 11/26/2004  . Vaginal stenosis 11/18/2014   Tight band about 1/2 inches into the vagina which will not allow passage of  my index finger.    Past Surgical History:  Procedure Laterality Date  . ABDOMINAL HYSTERECTOMY  1969   Dr.Braun   . bilateral eyelid surgery  2005   Dr.Scott  . BILATERAL OOPHORECTOMY  2002  . BREAST SURGERY      benign tumor removal, left breast 1969  . CATARACT EXTRACTION     Right  . CHOLECYSTECTOMY  1990   Dr.Moore   . COLONOSCOPY  3/18/20008   inflammation, diverticular associated colitis -Dr. Ardis Hughs  . COSMETIC SURGERY    . ESOPHAGOGASTRODUODENOSCOPY N/A 01/24/2014   Procedure: ESOPHAGOGASTRODUODENOSCOPY (EGD);  Surgeon: Irene Shipper, MD;  Location: [facility] ENDOSCOPY;  Service: Endoscopy;  Laterality: N/A;  . ESOPHAGOGASTRODUODENOSCOPY ENDOSCOPY  04/04/14   Dr. Ardis Hughs  . EYE SURGERY    . FOREIGN BODY REMOVAL N/A 01/24/2014   Procedure: FOREIGN BODY REMOVAL;  Surgeon: Irene Shipper, MD;  Location: [place];  Service: Endoscopy;  Laterality: N/A;  . HIP PINNING,CANNULATED  11/07/2011   Procedure: CANNULATED HIP PINNING;  Surgeon: Jessy Oto, MD;  Location: [facility];  Service: Orthopedics;  Laterality: Left;  . ORIF FEMORAL NECK FRACTURE W/ [facility]  11/07/2011   Dr.Nitka   . TONSILLECTOMY     Dr.Joe Tamala Julian  . TUBAL LIGATION    . VAGINAL PROLAPSE REPAIR  2002   Dr.Horback     Allergies  Allergen Reactions  . Penicillins Other (See Comments)    CHILDHOOD REACTION  . Abilify [Aripiprazole] Rash    Outpatient Encounter Medications as of 07/28/2017  Medication Sig  . acetaminophen-codeine (TYLENOL #3) 300-30 MG tablet Take 1 tablet by mouth every 6 (six) hours as needed for moderate pain.  Marland Kitchen antiseptic oral rinse (BIOTENE) LIQD 15 mLs by Mouth Rinse route every 4 (four) hours as needed for dry mouth.   Marland Kitchen aspirin 81 MG tablet Take 81 mg by mouth daily.  Marland Kitchen buPROPion (WELLBUTRIN XL) 150 MG 24 hr tablet Take 150 mg by mouth daily.   . Calcium Carb-Cholecalciferol (CALTRATE 600+D) 600-800 MG-UNIT TABS Take 1 tablet by mouth 2 (two) times daily.  . chlorpheniramine-HYDROcodone (TUSSIONEX PENNKINETIC ER) 10-8 MG/5ML SUER Take 5 mLs by mouth every 12 (twelve) hours as needed for cough.   . cholecalciferol (VITAMIN D) 1000 UNITS tablet Take 1,000 Units by mouth daily.  . clonazePAM (KLONOPIN) 0.5 MG  tablet Take 0.25 mg by mouth 2 (two) times daily. 8 am and 2 pm.  . clonazePAM (KLONOPIN) 0.5 MG tablet Take 0.25 mg by mouth daily as needed for anxiety (in addition to the scheduled dose).  Marland Kitchen escitalopram (LEXAPRO) 20 MG tablet Take 20 mg by mouth daily.   . Glucosamine HCl 1000 MG TABS Take 2,000 mg by mouth 2 (two) times daily.  Marland Kitchen ibandronate (BONIVA) 150 MG tablet Take 1 tablet (150 mg total) by mouth every 30 (thirty) days. Take in the morning with a full glass of water, on an empty stomach, and do not take anything else by mouth or lie down for the next 30 min.  . meloxicam (MOBIC) 7.5 MG tablet TAKE 1 TABLET BY MOUTH EVERY DAY AS NEEDED FOR ARTHRITIS  . Menthol, Topical Analgesic, (BIOFREEZE EX) Apply 1 application topically daily as needed.   . mirtazapine (REMERON) 30 MG tablet Take 30 mg by mouth at bedtime.  . Multiple Vitamin (MULTIVITAMIN WITH MINERALS) TABS Take 1 tablet by mouth daily.  Marland Kitchen NIFEdipine (PROCARDIA XL/ADALAT-CC) 60 MG 24 hr tablet TAKE 1 TABLET BY MOUTH EVERY DAY TO CONTROL BLOOD PRESSURE  .  pantoprazole (PROTONIX) 40 MG tablet One daily to reduce stomach acid  . Polyethyl Glycol-Propyl Glycol (SYSTANE) 0.4-0.3 % SOLN Place 1 drop into both eyes 2 (two) times daily.   . polyethylene glycol (MIRALAX / GLYCOLAX) packet Take 17 g by mouth as needed.   . pravastatin (PRAVACHOL) 10 MG tablet Take 10 mg by mouth daily.   No facility-administered encounter medications on file as of 07/28/2017.     Review of Systems  Constitutional: Negative for appetite change, chills, fatigue and fever.  HENT: Negative for congestion, rhinorrhea, sinus pressure, sinus pain, sneezing and sore throat.   Eyes: Positive for visual disturbance. Negative for discharge, redness and itching.       Wears eye glasses  Respiratory: Negative for cough, chest tightness, shortness of breath and wheezing.   Cardiovascular: Negative for chest pain, palpitations and leg swelling.  Gastrointestinal:  Negative for abdominal distention, abdominal pain, constipation, diarrhea, nausea and vomiting.  Genitourinary: Negative for dysuria, flank pain, frequency and urgency.  Musculoskeletal: Positive for gait problem.  Skin: Negative for color change, pallor and rash.  Neurological: Negative for dizziness, light-headedness and headaches.  Psychiatric/Behavioral: Negative for agitation, confusion and sleep disturbance. The patient is not nervous/anxious.     Immunization History  Administered Date(s) Administered  . Influenza Whole 02/14/2012, 02/14/2013  . Influenza-Unspecified 02/27/2014, 02/12/2015, 02/25/2016, 03/15/2017  . Pneumococcal Conjugate-13 07/13/2015  . Pneumococcal Polysaccharide-23 05/16/1998  . Td 05/16/2004  . Zoster 05/16/2005   Pertinent  Health Maintenance Due  Topic Date Due  . MAMMOGRAM  12/20/2017  . INFLUENZA VACCINE  Completed  . DEXA SCAN  Completed  . PNA vac Low Risk Adult  Completed   Fall Risk  04/21/2017 10/17/2016 10/14/2016 10/13/2016 05/31/2016  Falls in the past year? No No No No No    Vitals:   07/28/17 1301  BP: (!) 160/93  Pulse: 91  Resp: 20  Temp: 97.6 F (36.4 C)  SpO2: 93%  Weight: 146 lb 12.8 oz (66.6 kg)  Height: 5\' 2"  (1.575 m)   Body mass index is 26.85 kg/m. Physical Exam  Constitutional: She is oriented to person, place, and time. She appears well-developed.  Elderly in no acute distress   HENT:  Head: Normocephalic.  Right Ear: External ear normal.  Left Ear: External ear normal.  Mouth/Throat: Oropharynx is clear and moist. No oropharyngeal exudate.  Eyes: Pupils are equal, round, and reactive to light. Conjunctivae and EOM are normal. Right eye exhibits no discharge. Left eye exhibits no discharge. No scleral icterus.  Eye glasses in place   Neck: Normal range of motion. No JVD present. No thyromegaly present.  Cardiovascular: Normal rate, normal heart sounds and intact distal pulses. Exam reveals no gallop and no friction  rub.  No murmur heard. Pulmonary/Chest: Effort normal and breath sounds normal. No respiratory distress. She has no wheezes. She has no rales.  Abdominal: Soft. Bowel sounds are normal. She exhibits no distension. There is no tenderness. There is no rebound and no guarding.  Musculoskeletal: She exhibits no tenderness.  Moves x 4 extremities.unsteady gait uses walker.severe scoliosis noted.bilateral knee highTed hose in place.  Lymphadenopathy:    She has no cervical adenopathy.  Neurological: She is oriented to person, place, and time. Coordination normal.  Skin: Skin is warm and dry. No rash noted. No erythema. No pallor.  Psychiatric: She has a normal mood and affect.    Labs reviewed: Recent Labs    10/14/16 1337 10/17/16 1512 02/20/17 0750 07/10/17  NA  130* 137 140 143  K 4.0 4.5 4.3 4.2  CL 95* 100 103  --   CO2 26 25 27   --   GLUCOSE 106* 172* 106*  --   BUN 21 13 15 21   CREATININE 0.98* 0.78 0.94* 0.89  CALCIUM 8.7 9.6 10.0 9.3   Recent Labs    10/14/16 1337 10/17/16 1512 02/20/17 0750 07/10/17  AST 38* 25 19 18   ALT 48* 37* 13 13  ALKPHOS 72 61  --  60  BILITOT 0.5 0.2 0.4 0.2  PROT 5.6* 6.2 6.9 5.8  ALBUMIN 3.3* 3.6  --  3.5   Recent Labs    10/14/16 1337 10/17/16 1512 02/20/17 0750 07/10/17  WBC 19.0* 8.8 6.4 5.1  NEUTROABS  --  7,480 4,205  --   HGB 11.0* 11.8 13.3 11.6  HCT 33.7* 36.1 40.1 34  MCV 91.8 92.3 88.9  --   PLT 314 426* 275  --    Lab Results  Component Value Date   TSH 1.67 07/10/2017    Lab Results  Component Value Date   CHOL 174 02/20/2017   HDL 63 02/20/2017   LDLCALC 89 02/20/2017   TRIG 123 02/20/2017   CHOLHDL 2.8 02/20/2017    Significant Diagnostic Results in last 30 days:  No results found.  Assessment/Plan 1. Essential hypertension B/p reading was 160/93  Blood pressure rechecked was 154/78.Asymptomatic.negative exam findings.will continue to monitor for now.  2. Advanced care planning/counseling  discussion Patient's MOST form from 11/14/2011 reviewed with patient in the presence of [place] from 10:40 am -1100.Patient's current MOST form indicates attempt CPR in case of Cardiopulmonary event, full scope of treatment including transfer to the hospital.Also indicates antibiotics if life can be prolonged.Administer IVF and feeding Tube for long term if indicated.After MOST form was reviewed with patient and facility House coordinator,patient wishes to continue with current MOST form except change Tube feeding from long term if indicated to a define trial period.  Family/ staff Communication: Reviewed plan of care with patient ,facility Nurse and [place].  Labs/tests ordered: None   Dinah C Ngetich, NP

## 2017-08-02 ENCOUNTER — Non-Acute Institutional Stay: Payer: Medicare Other | Admitting: Internal Medicine

## 2017-08-02 ENCOUNTER — Encounter: Payer: Self-pay | Admitting: Internal Medicine

## 2017-08-02 DIAGNOSIS — R0981 Nasal congestion: Secondary | ICD-10-CM

## 2017-08-02 LAB — CMP 10231
ALBUMIN: 3.5
CHLORIDE: 109
Calcium: 9.3
Carbon Dioxide, Total: 26
GFR CALC NON AF AMER: 57
Globulin: 2.3
TOTAL PROTEIN: 5.8

## 2017-08-02 NOTE — Progress Notes (Signed)
[facility]   Provider: Blanchie Serve MD   Location:  [place] Room Number: 8 Place of Service:  ALF (13)  PCP: Blanchie Serve, MD Patient Care Team: Blanchie Serve, MD as PCP - General (Internal Medicine) Tina Hampton, [facility] Clent Jacks, MD as Consulting Physician (Ophthalmology) Milus Banister, MD as Consulting Physician (Gastroenterology) Norma Fredrickson, MD as Consulting Physician (Psychiatry) Jessy Oto, MD as Consulting Physician (Orthopedic Surgery) Bjorn Loser, MD as Consulting Physician (Urology)  Extended Emergency Contact Information Primary Emergency Contact: Joretta, [address] [place], [place] [address] Johnnette Litter of [place] Phone: [phone] Mobile Phone: [phone] Relation: Son  Code Status: DNR  Goals of Care: Advanced Directive information Advanced Directives 08/02/2017  Does Patient Have a Medical Advance Directive? Yes  Type of Paramedic of [place];Living will;Out of facility DNR (pink MOST or yellow form)  Does patient want to make changes to medical advance directive? No - Patient declined  Copy of [place] in Chart? Yes  Pre-existing out of facility DNR order (yellow form or pink MOST form) Pink MOST form placed in chart (order not valid for inpatient use)      Chief Complaint  Patient presents with  . Acute Visit    Not feeling well     HPI: Patient is a 84 y.o. female seen today for acute visit for not feeling well. She has not been feeling well for one week. She mentions that her eyes have been blurry at times. She has seen her eye doctor ad they think her dry eyes could be contributing some. She feels her nose to be stuffed at times and has post nasal drip in am. Denies cough, sore throat or trouble swallowing. She was having some stuffiness to her ears few days back and this has resolved. She denies any earache or discharge. Appetite is  poor.   Past Medical History:  Diagnosis Date  . Abnormal mammogram, unspecified 04/19/2011  . Allergy   . Anxiety   . Backache, unspecified 01/21/2005  . Cervicalgia 08/20/2013  . Chronic kidney disease, stage II (mild) 04/03/2009  . Chronic rhinitis 09/28/2010  . DDD (degenerative disc disease)   . Depression   . Dermatophytosis of nail 01/21/2005  . Diverticulosis of colon (without mention of hemorrhage) 06/17/2005  . Edema 11/26/2004  . GERD (gastroesophageal reflux disease)   . Hiatal hernia   . High cholesterol   . Hyperglycemia 11/18/2014  . Hypertension   . Insomnia, unspecified 10/18/2011  . Left inguinal hernia 04/24/2014  . Nocturia 09/19/2008  . Osteoporosis, senile 11/18/2014  . Other abnormal blood chemistry 04/03/2007  . Pain in joint, pelvic region and thigh 11/12/2011  . Pain in joint, shoulder region 08/15/2006  . Pain in limb 08/26/2005  . Palpitations 09/29/2009  . PAOD (peripheral arterial occlusive disease) ([place]) 11/18/2014   Right leg; diminished popliteal, dorsalis pedis, and posterior tibial artery pulsations   . Postmenopausal atrophic vaginitis 12/14/2004  . Scoliosis   . Senile osteoporosis 12/14/2004  . Tension headache 10/04/2005  . Unspecified cataract 11/26/2004  . Unspecified constipation 01/21/2005  . Unspecified urinary incontinence 11/26/2004  . Vaginal stenosis 11/18/2014   Tight band about 1/2 inches into the vagina which will not allow passage of my index finger.    Past Surgical History:  Procedure Laterality Date  . ABDOMINAL HYSTERECTOMY  1969   Dr.Braun   . bilateral eyelid  surgery  2005   Dr.Scott  . BILATERAL OOPHORECTOMY  2002  . BREAST SURGERY     benign tumor removal, left breast 1969  . CATARACT EXTRACTION     Right  . CHOLECYSTECTOMY  1990   Dr.Moore   . COLONOSCOPY  3/18/20008   inflammation, diverticular associated colitis -Dr. Ardis Hughs  . COSMETIC SURGERY    . ESOPHAGOGASTRODUODENOSCOPY N/A 01/24/2014   Procedure: ESOPHAGOGASTRODUODENOSCOPY  (EGD);  Surgeon: Irene Shipper, MD;  Location: [facility];  Service: Endoscopy;  Laterality: N/A;  . ESOPHAGOGASTRODUODENOSCOPY ENDOSCOPY  04/04/14   Dr. Ardis Hughs  . EYE SURGERY    . FOREIGN BODY REMOVAL N/A 01/24/2014   Procedure: FOREIGN BODY REMOVAL;  Surgeon: Irene Shipper, MD;  Location: [facility];  Service: Endoscopy;  Laterality: N/A;  . HIP PINNING,CANNULATED  11/07/2011   Procedure: CANNULATED HIP PINNING;  Surgeon: Jessy Oto, MD;  Location: [facility];  Service: Orthopedics;  Laterality: Left;  . ORIF FEMORAL NECK FRACTURE W/ [facility]  11/07/2011   Dr.Nitka   . TONSILLECTOMY     Dr.Joe Tamala Julian  . TUBAL LIGATION    . VAGINAL PROLAPSE REPAIR  2002   Dr.Horback     reports that  has never smoked. she has never used smokeless tobacco. She reports that she drinks alcohol. She reports that she does not use drugs. Social History   Socioeconomic History  . Marital status: Widowed    Spouse name: Not on file  . Number of children: Not on file  . Years of education: Not on file  . Highest education level: Not on file  Social Needs  . Financial resource strain: Not hard at all  . Food insecurity - worry: Never true  . Food insecurity - inability: Never true  . Transportation needs - medical: No  . Transportation needs - non-medical: No  Occupational History  . 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 Smoking status: Never Smoker  . Smokeless tobacco: Never Used  Substance and Sexual Activity  . Alcohol use: Yes    Alcohol/week: 0.0 oz    Comment: one glass of wine in evening  . Drug use: No  . Sexual activity: No  Other Topics Concern  . Not on file  Social History Narrative   Lives at [facility] since 04/14/2005   Widowed (husband expired 2014)   Has Living Will, POA, MOST   Exercise: water aerobic  5 times a week   Walks with walker   Never smoked   Drinks moderate amount of caffeinate beverages daily   Alcohol none        Family  History  Problem Relation Age of Onset  . Heart disease Mother   . Stroke Mother   . Emphysema Father   . Arthritis Father   . Alcohol abuse Brother   . Heart disease Son   . Leukemia Son   . Esophageal cancer Neg Hx   . Stomach cancer Neg Hx   . Colon cancer Neg Hx     Health Maintenance  Topic Date Due  . TETANUS/TDAP  05/16/2018 (Originally 05/16/2014)  . MAMMOGRAM  12/20/2017  . INFLUENZA VACCINE  Completed  . DEXA SCAN  Completed  . PNA vac Low Risk Adult  Completed    Allergies  Allergen Reactions  . Penicillins Other (See Comments)    CHILDHOOD REACTION  . Abilify [Aripiprazole] Rash    Outpatient Encounter Medications as of 08/02/2017  Medication  Sig  . acetaminophen-codeine (TYLENOL #3) 300-30 MG tablet Take 1 tablet by mouth every 6 (six) hours as needed for moderate pain.  Marland Kitchen antiseptic oral rinse (BIOTENE) LIQD 15 mLs by Mouth Rinse route every 4 (four) hours as needed for dry mouth.   Marland Kitchen aspirin 81 MG tablet Take 81 mg by mouth daily.  Marland Kitchen buPROPion (WELLBUTRIN XL) 150 MG 24 hr tablet Take 450 mg by mouth daily.   . Calcium Carb-Cholecalciferol (CALTRATE 600+D) 600-800 MG-UNIT TABS Take 1 tablet by mouth 2 (two) times daily.  . chlorpheniramine-HYDROcodone (TUSSIONEX PENNKINETIC ER) 10-8 MG/5ML SUER Take 5 mLs by mouth every 12 (twelve) hours as needed for cough.   . cholecalciferol (VITAMIN D) 1000 UNITS tablet Take 1,000 Units by mouth daily.  . clonazePAM (KLONOPIN) 0.5 MG tablet Take 0.25 mg by mouth 2 (two) times daily. 8 am and 2 pm.  . clonazePAM (KLONOPIN) 0.5 MG tablet Take 0.25 mg by mouth daily as needed for anxiety (in addition to the scheduled dose).  Marland Kitchen escitalopram (LEXAPRO) 20 MG tablet Take 30 mg by mouth daily.   . Eyelid Cleansers (OCUSOFT EYELID CLEANSING) PADS Apply 1 application topically daily.  . Glucosamine HCl 1000 MG TABS Take 2,000 mg by mouth 2 (two) times daily.  Marland Kitchen ibandronate (BONIVA) 150 MG tablet Take 1 tablet (150 mg total) by mouth  every 30 (thirty) days. Take in the morning with a full glass of water, on an empty stomach, and do not take anything else by mouth or lie down for the next 30 min.  . meloxicam (MOBIC) 7.5 MG tablet TAKE 1 TABLET BY MOUTH EVERY DAY AS NEEDED FOR ARTHRITIS  . Menthol, Topical Analgesic, (BIOFREEZE EX) Apply 1 application topically daily as needed.   . mirtazapine (REMERON) 30 MG tablet Take 30 mg by mouth at bedtime.  . Multiple Vitamin (MULTIVITAMIN WITH MINERALS) TABS Take 1 tablet by mouth daily.  Marland Kitchen NIFEdipine (PROCARDIA XL/ADALAT-CC) 60 MG 24 hr tablet TAKE 1 TABLET BY MOUTH EVERY DAY TO CONTROL BLOOD PRESSURE  . pantoprazole (PROTONIX) 40 MG tablet One daily to reduce stomach acid  . Polyethyl Glycol-Propyl Glycol (SYSTANE) 0.4-0.3 % SOLN Place 1 drop into both eyes 2 (two) times daily.   . polyethylene glycol (MIRALAX / GLYCOLAX) packet Take 17 g by mouth daily.   . pravastatin (PRAVACHOL) 10 MG tablet Take 10 mg by mouth daily.   No facility-administered encounter medications on file as of 08/02/2017.     Review of Systems  Constitutional: Positive for appetite change and fatigue. Negative for chills and fever.  HENT: Positive for postnasal drip. Negative for ear discharge, ear pain, mouth sores, nosebleeds, rhinorrhea, sinus pressure, sinus pain, sore throat and trouble swallowing.   Eyes: Positive for visual disturbance. Negative for photophobia.  Respiratory: Negative for cough, shortness of breath and wheezing.   Cardiovascular: Negative for chest pain and palpitations.  Gastrointestinal: Negative for abdominal pain, nausea and vomiting.  Genitourinary: Negative for dysuria.  Musculoskeletal: Positive for gait problem.  Skin: Negative for rash.  Neurological: Negative for dizziness and headaches.  Psychiatric/Behavioral: Positive for dysphoric mood.    Vitals:   08/02/17 1514  BP: 128/74  Pulse: 91  Resp: 20  Temp: 97.6 F (36.4 C)  TempSrc: Oral  SpO2: 93%  Weight: 146  lb 12.8 oz (66.6 kg)  Height: 5\' 2"  (1.575 m)   Body mass index is 26.85 kg/m.   Wt Readings from Last 3 Encounters:  08/02/17 146 lb 12.8 oz (66.6  kg)  07/28/17 146 lb 12.8 oz (66.6 kg)  07/10/17 149 lb (67.6 kg)   Physical Exam  Constitutional: She is oriented to person, place, and time. She appears well-developed and well-nourished. No distress.  HENT:  Head: Normocephalic and atraumatic.  Right Ear: External ear normal.  Left Ear: External ear normal.  Mouth/Throat: Oropharynx is clear and moist. No oropharyngeal exudate.  Pale nasal mucosa with some swelling to turbinates, no sinus tenderness  Eyes: Conjunctivae and EOM are normal. Pupils are equal, round, and reactive to light. Right eye exhibits no discharge. Left eye exhibits no discharge.  Neck: Neck supple.  Cardiovascular: Normal rate, regular rhythm and intact distal pulses.  Pulmonary/Chest: Effort normal and breath sounds normal. No respiratory distress. She has no wheezes. She has no rales.  Abdominal: Soft. Bowel sounds are normal. There is no tenderness.  Musculoskeletal:  Unsteady gait, uses walker, able to move all 4 extremities  Neurological: She is alert and oriented to person, place, and time.  Skin: Skin is warm and dry. No rash noted. She is not diaphoretic.    Labs reviewed: Basic Metabolic Panel: Recent Labs    10/14/16 1337 10/17/16 1512 02/20/17 0750 07/10/17  NA 130* 137 140 143  143  K 4.0 4.5 4.3 4.2  4.2  CL 95* 100 103 109  CO2 26 25 27 26   GLUCOSE 106* 172* 106*  --   BUN 21 13 15 21   CREATININE 0.98* 0.78 0.94* 0.9  0.89  CALCIUM 8.7 9.6 10.0 9.3  9.3   Liver Function Tests: Recent Labs    10/14/16 1337 10/17/16 1512 02/20/17 0750 07/10/17  AST 38* 25 19 18  18   ALT 48* 37* 13 13  13   ALKPHOS 72 61  --  60  60  BILITOT 0.5 0.2 0.4 0.2  PROT 5.6* 6.2 6.9 5.8  5.8  ALBUMIN 3.3* 3.6  --  3.5  3.5   No results for input(s): LIPASE, AMYLASE in the last 8760 hours. No  results for input(s): AMMONIA in the last 8760 hours. CBC: Recent Labs    10/14/16 1337 10/17/16 1512 02/20/17 0750 07/10/17  WBC 19.0* 8.8 6.4 5.1  5.1  NEUTROABS  --  7,480 4,205  --   HGB 11.0* 11.8 13.3 11.6*  11.6  HCT 33.7* 36.1 40.1 34*  34  MCV 91.8 92.3 88.9  --   PLT 314 426* 275 180   Cardiac Enzymes: No results for input(s): CKTOTAL, CKMB, CKMBINDEX, TROPONINI in the last 8760 hours. BNP: Invalid input(s): POCBNP No results found for: HGBA1C Lab Results  Component Value Date   TSH 1.67 07/10/2017   TSH 1.67 07/10/2017   No results found for: VITAMINB12 No results found for: FOLATE No results found for: IRON, TIBC, FERRITIN  Lipid Panel: Recent Labs    02/20/17 0750  CHOL 174  HDL 63  LDLCALC 89  TRIG 123  CHOLHDL 2.8   No results found for: HGBA1C  Procedures since last visit: No results found.  Assessment/Plan  Nasal congestion- clear lungs. Clear nostrils with pale and swollen turbinates, clear oropharynx. No sinus tenderness. Will have her on claritin 10 mg daily x 5 days with concern for allergic congestion. Supportive care. monitor  Labs/tests ordered:  none  Communication: reviewed care plan with patient and charge nurse.     Blanchie Serve, MD Internal Medicine [facility] [address] [place], [place] [address] Cell Phone (Monday-Friday 8 am - 5 pm):  [phone] On Call: [phone] and follow prompts after 5 pm and on weekends Office Phone: [phone] Office Fax: [phone]

## 2017-08-04 DIAGNOSIS — M150 Primary generalized (osteo)arthritis: Secondary | ICD-10-CM | POA: Diagnosis not present

## 2017-08-04 DIAGNOSIS — E785 Hyperlipidemia, unspecified: Secondary | ICD-10-CM | POA: Diagnosis not present

## 2017-08-04 DIAGNOSIS — M810 Age-related osteoporosis without current pathological fracture: Secondary | ICD-10-CM | POA: Diagnosis not present

## 2017-08-04 DIAGNOSIS — F339 Major depressive disorder, recurrent, unspecified: Secondary | ICD-10-CM | POA: Diagnosis not present

## 2017-08-07 ENCOUNTER — Ambulatory Visit: Payer: Medicare Other | Admitting: Rheumatology

## 2017-08-07 DIAGNOSIS — E785 Hyperlipidemia, unspecified: Secondary | ICD-10-CM

## 2017-08-07 DIAGNOSIS — M150 Primary generalized (osteo)arthritis: Principal | ICD-10-CM

## 2017-08-07 DIAGNOSIS — F339 Major depressive disorder, recurrent, unspecified: Secondary | ICD-10-CM

## 2017-08-07 DIAGNOSIS — M159 Polyosteoarthritis, unspecified: Secondary | ICD-10-CM

## 2017-08-07 DIAGNOSIS — M810 Age-related osteoporosis without current pathological fracture: Secondary | ICD-10-CM

## 2017-08-07 LAB — TSH: TSH: 1.84 mIU/L (ref 0.40–4.50)

## 2017-08-07 LAB — LIPID PANEL
CHOL/HDL RATIO: 3.5 (calc) (ref <5.0)
Cholesterol: 169 mg/dL (ref <200)
HDL: 48 mg/dL — AB (ref >50)
LDL Cholesterol (Calc): 99 mg/dL (calc)
Non-HDL Cholesterol (Calc): 121 mg/dL (calc) (ref <130)
Triglycerides: 121 mg/dL (ref <150)

## 2017-08-07 LAB — CBC
HEMATOCRIT: 37.5 % (ref 35.0–45.0)
HEMOGLOBIN: 12.8 g/dL (ref 11.7–15.5)
MCH: 30.2 pg (ref 27.0–33.0)
MCHC: 34.1 g/dL (ref 32.0–36.0)
MCV: 88.4 fL (ref 80.0–100.0)
MPV: 9.7 fL (ref 7.5–12.5)
Platelets: 248 10*3/uL (ref 140–400)
RBC: 4.24 10*6/uL (ref 3.80–5.10)
RDW: 12.2 % (ref 11.0–15.0)
WBC: 5.1 10*3/uL (ref 3.8–10.8)

## 2017-08-07 LAB — COMPLETE METABOLIC PANEL WITH GFR
AG RATIO: 1.6 (calc) (ref 1.0–2.5)
ALBUMIN MSPROF: 4 g/dL (ref 3.6–5.1)
ALT: 13 U/L (ref 6–29)
AST: 19 U/L (ref 10–35)
Alkaline phosphatase (APISO): 67 U/L (ref 33–130)
BUN / CREAT RATIO: 21 (calc) (ref 6–22)
BUN: 19 mg/dL (ref 7–25)
CO2: 28 mmol/L (ref 20–32)
CREATININE: 0.91 mg/dL — AB (ref 0.60–0.88)
Calcium: 9.7 mg/dL (ref 8.6–10.4)
Chloride: 107 mmol/L (ref 98–110)
GFR, Est African American: 64 mL/min/{1.73_m2} (ref >=60)
GFR, Est Non African American: 56 mL/min/{1.73_m2} — ABNORMAL LOW (ref >=60)
GLOBULIN: 2.5 g/dL (ref 1.9–3.7)
Glucose, Bld: 106 mg/dL — ABNORMAL HIGH (ref 65–99)
POTASSIUM: 4.3 mmol/L (ref 3.5–5.3)
SODIUM: 141 mmol/L (ref 135–146)
Total Bilirubin: 0.4 mg/dL (ref 0.2–1.2)
Total Protein: 6.5 g/dL (ref 6.1–8.1)

## 2017-08-14 DIAGNOSIS — F411 Generalized anxiety disorder: Secondary | ICD-10-CM | POA: Diagnosis not present

## 2017-08-16 ENCOUNTER — Encounter: Payer: Self-pay | Admitting: Internal Medicine

## 2017-08-17 ENCOUNTER — Other Ambulatory Visit: Payer: Self-pay | Admitting: *Deleted

## 2017-08-18 ENCOUNTER — Non-Acute Institutional Stay: Payer: Medicare Other | Admitting: Family

## 2017-08-18 ENCOUNTER — Encounter: Payer: Self-pay | Admitting: Family

## 2017-08-18 DIAGNOSIS — F411 Generalized anxiety disorder: Secondary | ICD-10-CM | POA: Diagnosis not present

## 2017-08-18 DIAGNOSIS — F339 Major depressive disorder, recurrent, unspecified: Secondary | ICD-10-CM | POA: Diagnosis not present

## 2017-08-18 DIAGNOSIS — B029 Zoster without complications: Secondary | ICD-10-CM

## 2017-08-18 MED ORDER — VALACYCLOVIR HCL 1 G PO TABS: 1000.0000 mg | ORAL_TABLET | Freq: Two times a day (BID) | ORAL | 0 refills | Status: AC

## 2017-08-18 NOTE — Progress Notes (Signed)
Location:  [place] Room Number: 8 Place of Service:  ALF [phone]) Provider: Dinah Ngetich FNP-C  Blanchie Serve, MD  Patient Care Team: Blanchie Serve, MD as PCP - General (Internal Medicine) Melina Modena, [facility] Clent Jacks, MD as Consulting Physician (Ophthalmology) Milus Banister, MD as Consulting Physician (Gastroenterology) Norma Fredrickson, MD as Consulting Physician (Psychiatry) Jessy Oto, MD as Consulting Physician (Orthopedic Surgery) Bjorn Loser, MD as Consulting Physician (Urology)  Extended Emergency Contact Information Primary Emergency Contact: [address] [place], [place] [address] Johnnette Litter of [place] Phone: [phone] Mobile Phone: [phone] Relation: Son  Code Status:  DNR Goals of care: Advanced Directive information Advanced Directives 08/18/2017  Does Patient Have a Medical Advance Directive? Yes  Type of Paramedic of [place];Out of facility DNR (pink MOST or yellow form);Living will  Does patient want to make changes to medical advance directive? -  Copy of [place] in Chart? Yes  Pre-existing out of facility DNR order (yellow form or pink MOST form) Pink MOST form placed in chart (order not valid for inpatient use)     Chief Complaint  Patient presents with  . Acute Visit    rash on back    HPI:  Pt is a 84 y.o. female seen today at [facility] for an acute visit for evaluation of rash on the back.she is seen in her room today with facility charge nurse present.Nurse reports patient had slight redness on her upper back yesterday now has increased skin redness with fluid filled multiple rash.Also states rash is spreading.Patient denies any fever,chills,itching or pain.No recent change of lotion or soap.she reports swimming in the pool twice a week.   Past Medical History:  Diagnosis Date  . Abnormal mammogram, unspecified 04/19/2011  . Allergy     . Anxiety   . Backache, unspecified 01/21/2005  . Cervicalgia 08/20/2013  . Chronic kidney disease, stage II (mild) 04/03/2009  . Chronic rhinitis 09/28/2010  . DDD (degenerative disc disease)   . Depression   . Dermatophytosis of nail 01/21/2005  . Diverticulosis of colon (without mention of hemorrhage) 06/17/2005  . Edema 11/26/2004  . GERD (gastroesophageal reflux disease)   . Hiatal hernia   . High cholesterol   . Hyperglycemia 11/18/2014  . Hypertension   . Insomnia, unspecified 10/18/2011  . Left inguinal hernia 04/24/2014  . Nocturia 09/19/2008  . Osteoporosis, senile 11/18/2014  . Other abnormal blood chemistry 04/03/2007  . Pain in joint, pelvic region and thigh 11/12/2011  . Pain in joint, shoulder region 08/15/2006  . Pain in limb 08/26/2005  . Palpitations 09/29/2009  . PAOD (peripheral arterial occlusive disease) ([place]) 11/18/2014   Right leg; diminished popliteal, dorsalis pedis, and posterior tibial artery pulsations   . Postmenopausal atrophic vaginitis 12/14/2004  . Scoliosis   . Senile osteoporosis 12/14/2004  . Tension headache 10/04/2005  . Unspecified cataract 11/26/2004  . Unspecified constipation 01/21/2005  . Unspecified urinary incontinence 11/26/2004  . Vaginal stenosis 11/18/2014   Tight band about 1/2 inches into the vagina which will not allow passage of my index finger.    Past Surgical History:  Procedure Laterality Date  . ABDOMINAL HYSTERECTOMY  1969   Dr.Braun   . bilateral eyelid surgery  2005   Dr.Scott  . BILATERAL OOPHORECTOMY  2002  . BREAST SURGERY     benign tumor removal, left breast 1969  . CATARACT EXTRACTION  Right  . CHOLECYSTECTOMY  1990   Dr.Moore   . COLONOSCOPY  3/18/20008   inflammation, diverticular associated colitis -Dr. Ardis Hughs  . COSMETIC SURGERY    . ESOPHAGOGASTRODUODENOSCOPY N/A 01/24/2014   Procedure: ESOPHAGOGASTRODUODENOSCOPY (EGD);  Surgeon: Irene Shipper, MD;  Location: [facility] ENDOSCOPY;  Service: Endoscopy;  Laterality: N/A;  .  ESOPHAGOGASTRODUODENOSCOPY ENDOSCOPY  04/04/14   Dr. Ardis Hughs  . EYE SURGERY    . FOREIGN BODY REMOVAL N/A 01/24/2014   Procedure: FOREIGN BODY REMOVAL;  Surgeon: Irene Shipper, MD;  Location: [place];  Service: Endoscopy;  Laterality: N/A;  . HIP PINNING,CANNULATED  11/07/2011   Procedure: CANNULATED HIP PINNING;  Surgeon: Jessy Oto, MD;  Location: [facility] ORS;  Service: Orthopedics;  Laterality: Left;  . ORIF FEMORAL NECK FRACTURE W/ [facility]  11/07/2011   Dr.Nitka   . TONSILLECTOMY     Dr.Joe Tamala Julian  . TUBAL LIGATION    . VAGINAL PROLAPSE REPAIR  2002   Dr.Horback     Allergies  Allergen Reactions  . Penicillins Other (See Comments)    CHILDHOOD REACTION  . Abilify [Aripiprazole] Rash    Outpatient Encounter Medications as of 08/18/2017  Medication Sig  . acetaminophen-codeine (TYLENOL #3) 300-30 MG tablet Take 1 tablet by mouth every 6 (six) hours as needed for moderate pain.  Marland Kitchen antiseptic oral rinse (BIOTENE) LIQD 15 mLs by Mouth Rinse route every 4 (four) hours as needed for dry mouth.   Marland Kitchen aspirin 81 MG tablet Take 81 mg by mouth daily.  Marland Kitchen buPROPion (WELLBUTRIN XL) 150 MG 24 hr tablet Take 450 mg by mouth daily.   . Calcium Carb-Cholecalciferol (CALTRATE 600+D) 600-800 MG-UNIT TABS Take 1 tablet by mouth 2 (two) times daily.  . chlorpheniramine-HYDROcodone (TUSSIONEX PENNKINETIC ER) 10-8 MG/5ML SUER Take 5 mLs by mouth every 12 (twelve) hours as needed for cough.   . cholecalciferol (VITAMIN D) 1000 UNITS tablet Take 1,000 Units by mouth daily.  . clonazePAM (KLONOPIN) 0.5 MG tablet Take 0.25 mg by mouth 2 (two) times daily. 8 am and 2 pm.  . clonazePAM (KLONOPIN) 0.5 MG tablet Take 0.25 mg by mouth daily as needed for anxiety (in addition to the scheduled dose).  Marland Kitchen escitalopram (LEXAPRO) 10 MG tablet Take 10 mg by mouth daily.  . Eyelid Cleansers (OCUSOFT EYELID CLEANSING) PADS Apply 1 application topically daily.  . Glucosamine HCl 1000 MG TABS Take 2,000 mg by mouth 2 (two) times  daily.  Marland Kitchen ibandronate (BONIVA) 150 MG tablet Take 1 tablet (150 mg total) by mouth every 30 (thirty) days. Take in the morning with a full glass of water, on an empty stomach, and do not take anything else by mouth or lie down for the next 30 min.  . meloxicam (MOBIC) 7.5 MG tablet TAKE 1 TABLET BY MOUTH EVERY DAY AS NEEDED FOR ARTHRITIS  . Menthol, Topical Analgesic, (BIOFREEZE EX) Apply 1 application topically daily as needed.   . mirtazapine (REMERON) 30 MG tablet Take 30 mg by mouth at bedtime.  . Multiple Vitamin (MULTIVITAMIN WITH MINERALS) TABS Take 1 tablet by mouth daily.  Marland Kitchen NIFEdipine (PROCARDIA XL/ADALAT-CC) 60 MG 24 hr tablet TAKE 1 TABLET BY MOUTH EVERY DAY TO CONTROL BLOOD PRESSURE  . pantoprazole (PROTONIX) 40 MG tablet One daily to reduce stomach acid  . Polyethyl Glycol-Propyl Glycol (SYSTANE) 0.4-0.3 % SOLN Place 1 drop into both eyes 2 (two) times daily.   . polyethylene glycol (MIRALAX / GLYCOLAX) packet Take 17 g by mouth daily.   Marland Kitchen  pravastatin (PRAVACHOL) 10 MG tablet Take 10 mg by mouth daily.  . valACYclovir (VALTREX) 1000 MG tablet Take 1 tablet (1,000 mg total) by mouth 2 (two) times daily for 7 days.   No facility-administered encounter medications on file as of 08/18/2017.     Review of Systems  Constitutional: Negative for appetite change, chills, fatigue and fever.  HENT: Negative for congestion, rhinorrhea, sinus pressure, sinus pain, sneezing and sore throat.   Eyes: Positive for visual disturbance. Negative for discharge, redness and itching.       Wears eye glasses   Respiratory: Negative for cough, chest tightness, shortness of breath and wheezing.   Cardiovascular: Negative for chest pain, palpitations and leg swelling.  Gastrointestinal: Negative for abdominal distention, abdominal pain, constipation, diarrhea, nausea and vomiting.  Skin: Positive for rash. Negative for pallor and wound.       Right upper back skin redness with fluid filled rash which seems  to be spreading.   Neurological: Negative for dizziness, weakness, light-headedness, numbness and headaches.  Hematological: Does not bruise/bleed easily.  Psychiatric/Behavioral: Negative for agitation, confusion and sleep disturbance. The patient is nervous/anxious.     Immunization History  Administered Date(s) Administered  . Influenza Whole 02/14/2012, 02/14/2013  . Influenza-Unspecified 02/27/2014, 02/12/2015, 02/25/2016, 03/15/2017  . Pneumococcal Conjugate-13 07/13/2015  . Pneumococcal Polysaccharide-23 05/16/1998  . Td 05/16/2004  . Zoster 05/16/2005   Pertinent  Health Maintenance Due  Topic Date Due  . INFLUENZA VACCINE  12/14/2017  . MAMMOGRAM  12/20/2017  . DEXA SCAN  Completed  . PNA vac Low Risk Adult  Completed   Fall Risk  04/21/2017 10/17/2016 10/14/2016 10/13/2016 05/31/2016  Falls in the past year? No No No No No    Vitals:   08/18/17 1034  BP: 113/71  Pulse: 82  Resp: 18  Temp: 98.1 F (36.7 C)  SpO2: 93%  Weight: 148 lb 12.8 oz (67.5 kg)  Height: 5\' 2"  (1.575 m)   Body mass index is 27.22 kg/m. Physical Exam  Constitutional: She is oriented to person, place, and time. She appears well-developed.  Elderly anxious during visit   HENT:  Head: Normocephalic.  Mouth/Throat: Oropharynx is clear and moist. No oropharyngeal exudate.  Eyes: Pupils are equal, round, and reactive to light. Conjunctivae and EOM are normal. Right eye exhibits no discharge. Left eye exhibits no discharge. No scleral icterus.  Eye glasses in place   Neck: Normal range of motion. No JVD present. No thyromegaly present.  Cardiovascular: Normal rate, regular rhythm, normal heart sounds and intact distal pulses. Exam reveals no gallop and no friction rub.  No murmur heard. Pulmonary/Chest: Effort normal and breath sounds normal. No respiratory distress. She has no wheezes. She has no rales.  Abdominal: Soft. Bowel sounds are normal. She exhibits no distension. There is no tenderness.  There is no rebound and no guarding.  Lymphadenopathy:    She has no cervical adenopathy.  Neurological: She is oriented to person, place, and time. Gait abnormal.  Skin: Skin is warm and dry. Rash noted. No pallor.  Right upper back skin redness with multiple vesicles along dermatone.  Psychiatric: Her speech is normal and behavior is normal. Judgment and thought content normal. Her mood appears anxious.    Labs reviewed: Recent Labs    10/17/16 1512 02/20/17 0750 07/10/17 08/04/17 0000  NA 137 140 143  143 141  K 4.5 4.3 4.2  4.2 4.3  CL 100 103 109 107  CO2 25 27 26 28   GLUCOSE  172* 106*  --  106*  BUN 13 15 21 19   CREATININE 0.78 0.94* 0.9  0.89 0.91*  CALCIUM 9.6 10.0 9.3  9.3 9.7   Recent Labs    10/14/16 1337 10/17/16 1512 02/20/17 0750 07/10/17 08/04/17 0000  AST 38* 25 19 18  18 19   ALT 48* 37* 13 13  13 13   ALKPHOS 72 61  --  60  60  --   BILITOT 0.5 0.2 0.4 0.2 0.4  PROT 5.6* 6.2 6.9 5.8  5.8 6.5  ALBUMIN 3.3* 3.6  --  3.5  3.5  --    Recent Labs    10/17/16 1512 02/20/17 0750 07/10/17 08/04/17 0000  WBC 8.8 6.4 5.1  5.1 5.1  NEUTROABS 7,480 4,205  --   --   HGB 11.8 13.3 11.6*  11.6 12.8  HCT 36.1 40.1 34*  34 37.5  MCV 92.3 88.9  --  88.4  PLT 426* 275 180 248   Lab Results  Component Value Date   TSH 1.84 08/04/2017   No results found for: HGBA1C Lab Results  Component Value Date   CHOL 169 08/04/2017   HDL 48 (L) 08/04/2017   LDLCALC 99 08/04/2017   TRIG 121 08/04/2017   CHOLHDL 3.5 08/04/2017    Significant Diagnostic Results in last 30 days:  No results found.  Assessment/Plan  1. Herpes zoster without complication Afebrile.Right upper back multiple vesicles and skin redness noted along dermatone towards rib cage.no drainage noted.Start on valacyclovir 1 gram twice daily x 7 days.Facility Nurse to cover affected areas with non adhensive gauze.change dressing daily and as needed if soiled.Place on contact precaution  isolation until rash resolve.  2. Depression/anxiety     Anxious during visit with new diagnosis of shingles and need to be on contact precaution isolation.Continue on wellbutrin XL450 mg tablet daily,Remeron 30 mg tablet daily,Clonazepam 0.25 mg tablet twice daily and 0.25 mg tablet daily PRN and Lexapro 10 mg tablet daily.continue to follow up with Psychiatry service.Monitor for mood changes.Restorative Therapy to increase 1 on 1 visit to prevent worsening of depression while on contact precaution isolation.    Family/ staff Communication: Reviewed plan of care with patient and facility Nurse.   Labs/tests ordered: None   Dinah C Ngetich, NP

## 2017-08-21 DIAGNOSIS — F411 Generalized anxiety disorder: Secondary | ICD-10-CM | POA: Diagnosis not present

## 2017-08-26 ENCOUNTER — Other Ambulatory Visit: Payer: Self-pay | Admitting: Internal Medicine

## 2017-08-26 DIAGNOSIS — M159 Polyosteoarthritis, unspecified: Secondary | ICD-10-CM

## 2017-08-26 DIAGNOSIS — M150 Primary generalized (osteo)arthritis: Principal | ICD-10-CM

## 2017-08-28 ENCOUNTER — Encounter: Payer: Self-pay | Admitting: Internal Medicine

## 2017-08-28 ENCOUNTER — Non-Acute Institutional Stay: Payer: Medicare Other | Admitting: Internal Medicine

## 2017-08-28 DIAGNOSIS — B029 Zoster without complications: Secondary | ICD-10-CM | POA: Diagnosis not present

## 2017-08-28 NOTE — Progress Notes (Signed)
Location:  [place] Room Number: 8 Place of Service:  ALF [phone]) Provider:  Blanchie Serve, MD  Blanchie Serve, MD  Patient Care Team: Blanchie Serve, MD as PCP - General (Internal Medicine) Melina Modena, [facility] Clent Jacks, MD as Consulting Physician (Ophthalmology) Milus Banister, MD as Consulting Physician (Gastroenterology) Norma Fredrickson, MD as Consulting Physician (Psychiatry) Jessy Oto, MD as Consulting Physician (Orthopedic Surgery) Bjorn Loser, MD as Consulting Physician (Urology)  Extended Emergency Contact Information Primary Emergency Contact: Mariza, [address], [place] [address] Johnnette Litter of [place] Phone: [phone] Mobile Phone: [phone] Relation: Son  Code Status:  DNR  Goals of care: Advanced Directive information Advanced Directives 08/28/2017  Does Patient Have a Medical Advance Directive? Yes  Type of Paramedic of [place];Living will;Out of facility DNR (pink MOST or yellow form)  Does patient want to make changes to medical advance directive? No - Patient declined  Copy of [place] in Chart? Yes  Pre-existing out of facility DNR order (yellow form or pink MOST form) Pink MOST form placed in chart (order not valid for inpatient use)     Chief Complaint  Patient presents with  . Acute Visit    follow up on rash    HPI:  Pt is a 84 y.o. female seen today for an acute visit for follow up on rash. She was treated for shingles with valcyclovir for 1 week on 08/18/17 and was placed on contact precautions. While on treatment, a new rash was noted to her right breast area. She has completed valcyclovir and has a dressing in place. Nursing is concerned about drainage from the lesion with concern for worsening. Patient is seen in her room today. She complaints of discomfort to right breast at time of removal of dressing. Denies any shooting pain or tingling.  Denies itching. Charge nurse present during the visit. She mentions that the back rash has improved and the lesion in breast area has opened and has drainage.    Past Medical History:  Diagnosis Date  . Abnormal mammogram, unspecified 04/19/2011  . Allergy   . Anxiety   . Backache, unspecified 01/21/2005  . Cervicalgia 08/20/2013  . Chronic kidney disease, stage II (mild) 04/03/2009  . Chronic rhinitis 09/28/2010  . DDD (degenerative disc disease)   . Depression   . Dermatophytosis of nail 01/21/2005  . Diverticulosis of colon (without mention of hemorrhage) 06/17/2005  . Edema 11/26/2004  . GERD (gastroesophageal reflux disease)   . Hiatal hernia   . High cholesterol   . Hyperglycemia 11/18/2014  . Hypertension   . Insomnia, unspecified 10/18/2011  . Left inguinal hernia 04/24/2014  . Nocturia 09/19/2008  . Osteoporosis, senile 11/18/2014  . Other abnormal blood chemistry 04/03/2007  . Pain in joint, pelvic region and thigh 11/12/2011  . Pain in joint, shoulder region 08/15/2006  . Pain in limb 08/26/2005  . Palpitations 09/29/2009  . PAOD (peripheral arterial occlusive disease) ([place]) 11/18/2014   Right leg; diminished popliteal, dorsalis pedis, and posterior tibial artery pulsations   . Postmenopausal atrophic vaginitis 12/14/2004  . Scoliosis   . Senile osteoporosis 12/14/2004  . Tension headache 10/04/2005  . Unspecified cataract 11/26/2004  . Unspecified constipation 01/21/2005  . Unspecified urinary incontinence 11/26/2004  . Vaginal stenosis 11/18/2014   Tight band about 1/2 inches into the vagina which will not allow passage of my index finger.    Past Surgical  History:  Procedure Laterality Date  . ABDOMINAL HYSTERECTOMY  1969   Dr.Braun   . bilateral eyelid surgery  2005   Dr.Scott  . BILATERAL OOPHORECTOMY  2002  . BREAST SURGERY     benign tumor removal, left breast 1969  . CATARACT EXTRACTION     Right  . CHOLECYSTECTOMY  1990   Dr.Moore   . COLONOSCOPY  3/18/20008   inflammation,  diverticular associated colitis -Dr. Ardis Hughs  . COSMETIC SURGERY    . ESOPHAGOGASTRODUODENOSCOPY N/A 01/24/2014   Procedure: ESOPHAGOGASTRODUODENOSCOPY (EGD);  Surgeon: Irene Shipper, MD;  Location: [facility] ENDOSCOPY;  Service: Endoscopy;  Laterality: N/A;  . ESOPHAGOGASTRODUODENOSCOPY ENDOSCOPY  04/04/14   Dr. Ardis Hughs  . EYE SURGERY    . FOREIGN BODY REMOVAL N/A 01/24/2014   Procedure: FOREIGN BODY REMOVAL;  Surgeon: Irene Shipper, MD;  Location: [place];  Service: Endoscopy;  Laterality: N/A;  . HIP PINNING,CANNULATED  11/07/2011   Procedure: CANNULATED HIP PINNING;  Surgeon: Jessy Oto, MD;  Location: [facility];  Service: Orthopedics;  Laterality: Left;  . ORIF FEMORAL NECK FRACTURE W/ [facility]  11/07/2011   Dr.Nitka   . TONSILLECTOMY     Dr.Joe Tamala Julian  . TUBAL LIGATION    . VAGINAL PROLAPSE REPAIR  2002   Dr.Horback     Allergies  Allergen Reactions  . Penicillins Other (See Comments)    CHILDHOOD REACTION  . Abilify [Aripiprazole] Rash    Outpatient Encounter Medications as of 08/28/2017  Medication Sig  . acetaminophen-codeine (TYLENOL #3) 300-30 MG tablet Take 1 tablet by mouth every 6 (six) hours as needed for moderate pain.  Marland Kitchen antiseptic oral rinse (BIOTENE) LIQD 15 mLs by Mouth Rinse route every 4 (four) hours as needed for dry mouth.   Marland Kitchen aspirin 81 MG tablet Take 81 mg by mouth daily.  Marland Kitchen buPROPion (WELLBUTRIN XL) 150 MG 24 hr tablet Take 450 mg by mouth daily.   . Calcium Carb-Cholecalciferol (CALTRATE 600+D) 600-800 MG-UNIT TABS Take 1 tablet by mouth 2 (two) times daily.  . Capsaicin 0.1 % CREA Apply 1 application topically 3 (three) times daily as needed.  . chlorpheniramine-HYDROcodone (TUSSIONEX PENNKINETIC ER) 10-8 MG/5ML SUER Take 5 mLs by mouth every 12 (twelve) hours as needed for cough.   . cholecalciferol (VITAMIN D) 1000 UNITS tablet Take 1,000 Units by mouth daily.  . clonazePAM (KLONOPIN) 0.5 MG tablet Take 0.25 mg by mouth 2 (two) times daily. 8 am and 2 pm.  .  clonazePAM (KLONOPIN) 0.5 MG tablet Take 0.25 mg by mouth daily as needed for anxiety (in addition to the scheduled dose).  Marland Kitchen escitalopram (LEXAPRO) 10 MG tablet Take 10 mg by mouth daily.  . Eyelid Cleansers (OCUSOFT EYELID CLEANSING) PADS Apply 1 application topically daily.  . Glucosamine HCl 1000 MG TABS Take 2,000 mg by mouth 2 (two) times daily.  Marland Kitchen ibandronate (BONIVA) 150 MG tablet Take 1 tablet (150 mg total) by mouth every 30 (thirty) days. Take in the morning with a full glass of water, on an empty stomach, and do not take anything else by mouth or lie down for the next 30 min.  . meloxicam (MOBIC) 7.5 MG tablet TAKE 1 TABLET BY MOUTH EVERY DAY AS NEEDED FOR ARTHRITIS  . Menthol, Topical Analgesic, (BIOFREEZE EX) Apply 1 application topically daily as needed.   . mirtazapine (REMERON) 30 MG tablet Take 30 mg by mouth at bedtime.  . Multiple Vitamin (MULTIVITAMIN WITH MINERALS) TABS Take 1 tablet by mouth daily.  Marland Kitchen  NIFEdipine (PROCARDIA XL/ADALAT-CC) 60 MG 24 hr tablet TAKE 1 TABLET BY MOUTH EVERY DAY TO CONTROL BLOOD PRESSURE  . pantoprazole (PROTONIX) 40 MG tablet One daily to reduce stomach acid  . Polyethyl Glycol-Propyl Glycol (SYSTANE) 0.4-0.3 % SOLN Place 1 drop into both eyes 2 (two) times daily. Can also use as needed  . polyethylene glycol (MIRALAX / GLYCOLAX) packet Take 17 g by mouth daily.   . pravastatin (PRAVACHOL) 10 MG tablet Take 10 mg by mouth daily.  . [DISCONTINUED] meloxicam (MOBIC) 7.5 MG tablet TAKE 1 TABLET BY MOUTH EVERY DAY AS NEEDED FOR ARTHRITIS   No facility-administered encounter medications on file as of 08/28/2017.     Review of Systems  Constitutional: Negative for chills and fever.  Respiratory: Negative for shortness of breath.   Cardiovascular: Negative for chest pain.  Skin: Positive for rash.       No new rash or skin lesion reported.  Neurological: Negative for headaches.  Psychiatric/Behavioral: Negative for behavioral problems.     Immunization History  Administered Date(s) Administered  . Influenza Whole 02/14/2012, 02/14/2013  . Influenza-Unspecified 02/27/2014, 02/12/2015, 02/25/2016, 03/15/2017  . Pneumococcal Conjugate-13 07/13/2015  . Pneumococcal Polysaccharide-23 05/16/1998  . Td 05/16/2004  . Zoster 05/16/2005   Pertinent  Health Maintenance Due  Topic Date Due  . INFLUENZA VACCINE  12/14/2017  . MAMMOGRAM  12/20/2017  . DEXA SCAN  Completed  . PNA vac Low Risk Adult  Completed   Fall Risk  04/21/2017 10/17/2016 10/14/2016 10/13/2016 05/31/2016  Falls in the past year? No No No No No   Functional Status Survey:    Vitals:   08/28/17 0958  BP: 138/72  Pulse: 72  Resp: 20  Temp: 98.1 F (36.7 C)  TempSrc: Oral  SpO2: 93%  Weight: 148 lb 12.8 oz (67.5 kg)  Height: 5\' 2"  (1.575 m)   Body mass index is 27.22 kg/m. Physical Exam  Constitutional: She is oriented to person, place, and time.  Elderly female patient in no acute distress  HENT:  Head: Normocephalic and atraumatic.  Mouth/Throat: Oropharynx is clear and moist.  Eyes: Pupils are equal, round, and reactive to light. Conjunctivae are normal. Right eye exhibits no discharge. Left eye exhibits no discharge.  Pulmonary/Chest: Effort normal and breath sounds normal. No respiratory distress.  Neurological: She is alert and oriented to person, place, and time.  Skin: Skin is warm. Rash noted. She is not diaphoretic.  Right mid back area has erythematous area with dried lesions, no drainage, no vesicles noted. Right breast area has mild erythema and 3 open area with clear drainage. No periwound erythema. No vesicles noted. no signs of infection. Non tender.   Psychiatric: She has a normal mood and affect.    Labs reviewed: Recent Labs    10/17/16 1512 02/20/17 0750 07/10/17 08/04/17 0000  NA 137 140 143  143 141  K 4.5 4.3 4.2  4.2 4.3  CL 100 103 109 107  CO2 25 27 26 28   GLUCOSE 172* 106*  --  106*  BUN 13 15 21 19    CREATININE 0.78 0.94* 0.9  0.89 0.91*  CALCIUM 9.6 10.0 9.3  9.3 9.7   Recent Labs    10/14/16 1337 10/17/16 1512 02/20/17 0750 07/10/17 08/04/17 0000  AST 38* 25 19 18  18 19   ALT 48* 37* 13 13  13 13   ALKPHOS 72 61  --  60  60  --   BILITOT 0.5 0.2 0.4 0.2 0.4  PROT  5.6* 6.2 6.9 5.8  5.8 6.5  ALBUMIN 3.3* 3.6  --  3.5  3.5  --    Recent Labs    10/17/16 1512 02/20/17 0750 07/10/17 08/04/17 0000  WBC 8.8 6.4 5.1  5.1 5.1  NEUTROABS 7,480 4,205  --   --   HGB 11.8 13.3 11.6*  11.6 12.8  HCT 36.1 40.1 34*  34 37.5  MCV 92.3 88.9  --  88.4  PLT 426* 275 180 248   Lab Results  Component Value Date   TSH 1.84 08/04/2017   No results found for: HGBA1C Lab Results  Component Value Date   CHOL 169 08/04/2017   HDL 48 (L) 08/04/2017   LDLCALC 99 08/04/2017   TRIG 121 08/04/2017   CHOLHDL 3.5 08/04/2017    Significant Diagnostic Results in last 30 days:  No results found.  Assessment/Plan  1. Herpes zoster without complication Completed course of valcyclovir. No signs of infection to open lesion on right breast. Mid back area has dried out and resolving lesion. No symptom of neuropathic involvement. Continue to clean area to mid back with normal saline and apply dressing. For the opening to the right breast, clean with normal saline and apply non adherent dressing followed by bra to help hold the dressing in place for now. She has pain to right breast area only when dressing is removed, thus choosing non adherent dressing. Monitor for signs of infection like erythema, purulent drainage, pain, fever. Reassess if new lesions appear or signs of complication of current zoster lesion are noted.     Family/ staff Communication: reviewed care plan with patient and charge nurse.    Labs/tests ordered:  none  Blanchie Serve, MD Internal Medicine [facility] [address] [place], [place] [address] Cell Phone (Monday-Friday 8 am  - 5 pm): [phone] On Call: [phone] and follow prompts after 5 pm and on weekends Office Phone: [phone] Office Fax: [phone]

## 2017-08-28 NOTE — Addendum Note (Signed)
Addended byBlanchie Serve on: 08/28/2017 12:12 PM   Modules accepted: Level of Service

## 2017-08-29 DIAGNOSIS — F411 Generalized anxiety disorder: Secondary | ICD-10-CM | POA: Diagnosis not present

## 2017-08-30 DIAGNOSIS — F411 Generalized anxiety disorder: Secondary | ICD-10-CM | POA: Diagnosis not present

## 2017-08-30 DIAGNOSIS — F331 Major depressive disorder, recurrent, moderate: Secondary | ICD-10-CM | POA: Diagnosis not present

## 2017-09-05 DIAGNOSIS — F411 Generalized anxiety disorder: Secondary | ICD-10-CM | POA: Diagnosis not present

## 2017-09-08 DIAGNOSIS — F411 Generalized anxiety disorder: Secondary | ICD-10-CM | POA: Diagnosis not present

## 2017-09-12 DIAGNOSIS — F411 Generalized anxiety disorder: Secondary | ICD-10-CM | POA: Diagnosis not present

## 2017-09-19 DIAGNOSIS — F411 Generalized anxiety disorder: Secondary | ICD-10-CM | POA: Diagnosis not present

## 2017-09-20 ENCOUNTER — Non-Acute Institutional Stay: Payer: Medicare Other | Admitting: Internal Medicine

## 2017-09-20 ENCOUNTER — Encounter: Payer: Self-pay | Admitting: Internal Medicine

## 2017-09-20 VITALS — BP 134/68 | HR 87 | Temp 97.5°F | Resp 16 | Ht 62.0 in | Wt 144.3 lb

## 2017-09-20 DIAGNOSIS — M159 Polyosteoarthritis, unspecified: Secondary | ICD-10-CM

## 2017-09-20 DIAGNOSIS — K449 Diaphragmatic hernia without obstruction or gangrene: Secondary | ICD-10-CM | POA: Diagnosis not present

## 2017-09-20 DIAGNOSIS — F339 Major depressive disorder, recurrent, unspecified: Secondary | ICD-10-CM | POA: Diagnosis not present

## 2017-09-20 DIAGNOSIS — M150 Primary generalized (osteo)arthritis: Secondary | ICD-10-CM | POA: Diagnosis not present

## 2017-09-20 DIAGNOSIS — Z0000 Encounter for general adult medical examination without abnormal findings: Secondary | ICD-10-CM | POA: Diagnosis not present

## 2017-09-20 DIAGNOSIS — E785 Hyperlipidemia, unspecified: Secondary | ICD-10-CM | POA: Diagnosis not present

## 2017-09-20 DIAGNOSIS — R739 Hyperglycemia, unspecified: Secondary | ICD-10-CM | POA: Diagnosis not present

## 2017-09-20 DIAGNOSIS — I10 Essential (primary) hypertension: Secondary | ICD-10-CM | POA: Diagnosis not present

## 2017-09-20 DIAGNOSIS — M810 Age-related osteoporosis without current pathological fracture: Secondary | ICD-10-CM

## 2017-09-20 DIAGNOSIS — H9193 Unspecified hearing loss, bilateral: Secondary | ICD-10-CM | POA: Diagnosis not present

## 2017-09-20 NOTE — Progress Notes (Signed)
[facility]  Provider: Blanchie Serve MD   Location:  [place] of Service:  Clinic (12)  PCP: Blanchie Serve, MD Patient Care Team: Blanchie Serve, MD as PCP - General (Internal Medicine) [place] Clent Jacks, MD as Consulting Physician (Ophthalmology) Milus Banister, MD as Consulting Physician (Gastroenterology) Norma Fredrickson, MD as Consulting Physician (Psychiatry) Jessy Oto, MD as Consulting Physician (Orthopedic Surgery) Bjorn Loser, MD as Consulting Physician (Urology)  Extended Emergency Contact Information Primary Emergency Contact: [address] [place], [place] [address] Johnnette Litter of [place] Phone: [phone] Mobile Phone: [phone] Relation: Son  Code Status: DNR  Goals of Care: Advanced Directive information Advanced Directives 09/20/2017  Does Patient Have a Medical Advance Directive? Yes  Type of Paramedic of [place];Living will;Out of facility DNR (pink MOST or yellow form)  Does patient want to make changes to medical advance directive? No - Patient declined  Copy of [place] in Chart? Yes  Pre-existing out of facility DNR order (yellow form or pink MOST form) Pink MOST form placed in chart (order not valid for inpatient use)      Chief Complaint  Patient presents with  . Annual Exam    Patient still has some concerns with her concerns. She mentioned that some days are better than others.   Marland Kitchen MMSE    HPI: Patient is a 84 y.o. female seen today for annual visit.   Major depression- ongoing, taking remeron 30 mg qhs, wellbutrin 150 mg daily, trintellix 10 mg daily. Was taking 450 mg daily before that has now been decreased to 150 mg daily per patient by her psychiatrist. Sleeping good. Appetite is poor. Takes lorazepam   Hyperlipidemia- takes pravastatin 10 mg daily and baby aspirin  Constipation- takes miralax daily, moved  her bowel this am, miralax helps.   OA- her back and shoulders bothers her the most. taking glucosamine 2000 mg bid, vitamin d supplement. Completed therapy sessions. Takes meloxicam 7.5 mg daily as needed and tylenol extra strength as needed. Also on biofreeze daily as needed  Hypertension- takes nifedipine 60 mg daily, denies chest pain or headache or dyspnea  Osteoporosis- takes calcium and vitamin d supplement with monthly boniva. No recent fall or fracture reported. Takes a walker for ambulation.   GERD- currently on pantoprazole 40 mg daily. Denies any heartburn or indigestion   Past Medical History:  Diagnosis Date  . Abnormal mammogram, unspecified 04/19/2011  . Allergy   . Anxiety   . Backache, unspecified 01/21/2005  . Cervicalgia 08/20/2013  . Chronic kidney disease, stage II (mild) 04/03/2009  . Chronic rhinitis 09/28/2010  . DDD (degenerative disc disease)   . Depression   . Dermatophytosis of nail 01/21/2005  . Diverticulosis of colon (without mention of hemorrhage) 06/17/2005  . Edema 11/26/2004  . GERD (gastroesophageal reflux disease)   . Hiatal hernia   . High cholesterol   . Hyperglycemia 11/18/2014  . Hypertension   . Insomnia, unspecified 10/18/2011  . Left inguinal hernia 04/24/2014  . Nocturia 09/19/2008  . Osteoporosis, senile 11/18/2014  . Other abnormal blood chemistry 04/03/2007  . Pain in joint, pelvic region and thigh 11/12/2011  . Pain in joint, shoulder region 08/15/2006  . Pain in limb 08/26/2005  . Palpitations 09/29/2009  . PAOD (peripheral arterial occlusive disease) ([place]) 11/18/2014   Right leg; diminished popliteal, dorsalis pedis, and posterior tibial  artery pulsations   . Postmenopausal atrophic vaginitis 12/14/2004  . Scoliosis   . Senile osteoporosis 12/14/2004  . Tension headache 10/04/2005  . Unspecified cataract 11/26/2004  . Unspecified constipation 01/21/2005  . Unspecified urinary incontinence 11/26/2004  . Vaginal stenosis 11/18/2014   Tight band about 1/2  inches into the vagina which will not allow passage of my index finger.    Past Surgical History:  Procedure Laterality Date  . ABDOMINAL HYSTERECTOMY  1969   Dr.Braun   . bilateral eyelid surgery  2005   Dr.Scott  . BILATERAL OOPHORECTOMY  2002  . BREAST SURGERY     benign tumor removal, left breast 1969  . CATARACT EXTRACTION     Right  . CHOLECYSTECTOMY  1990   Dr.Moore   . COLONOSCOPY  3/18/20008   inflammation, diverticular associated colitis -Dr. Ardis Hughs  . COSMETIC SURGERY    . ESOPHAGOGASTRODUODENOSCOPY N/A 01/24/2014   Procedure: ESOPHAGOGASTRODUODENOSCOPY (EGD);  Surgeon: Irene Shipper, MD;  Location: [facility] ENDOSCOPY;  Service: Endoscopy;  Laterality: N/A;  . ESOPHAGOGASTRODUODENOSCOPY ENDOSCOPY  04/04/14   Dr. Ardis Hughs  . EYE SURGERY    . FOREIGN BODY REMOVAL N/A 01/24/2014   Procedure: FOREIGN BODY REMOVAL;  Surgeon: Irene Shipper, MD;  Location: [facility];  Service: Endoscopy;  Laterality: N/A;  . HIP PINNING,CANNULATED  11/07/2011   Procedure: CANNULATED HIP PINNING;  Surgeon: Jessy Oto, MD;  Location: [facility] OR[facility];  Service: Orthopedics;  Laterality: Left;  . ORIF FEMORAL NECK FRACTURE W/ [facility]  11/07/2011   Dr.Nitka   . TONSILLECTOMY     Dr.Joe Tamala Julian  . TUBAL LIGATION    . VAGINAL PROLAPSE REPAIR  2002   Dr.Horback     reports that she has never smoked. She has never used smokeless tobacco. She reports that she drinks alcohol. She reports that she does not use drugs. Social History   Socioeconomic History  . Marital status: Widowed    Spouse name: Not on file  . Number of children: Not on file  . Years of education: Not on file  . Highest education level: Not on file  Occupational History  . 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 Financial resource strain: Not hard at all  . Food insecurity:    Worry: Never true    Inability: Never true  . Transportation needs:    Medical: No    Non-medical: No  Tobacco Use  . Smoking status:  Never Smoker  . Smokeless tobacco: Never Used  Substance and Sexual Activity  . Alcohol use: Yes    Alcohol/week: 0.0 oz    Comment: one glass of wine in evening  . Drug use: No  . Sexual activity: Never  Lifestyle  . Physical activity:    Days per week: 0 days    Minutes per session: 0 min  . Stress: Very much  Relationships  . Social connections:    Talks on phone: More than three times a week    Gets together: More than three times a week    Attends religious service: 1 to 4 times per year    Active member of club or organization: Yes    Attends meetings of clubs or organizations: Never    Relationship status: Widowed  . Intimate partner violence:    Fear of current or ex partner: No    Emotionally abused: No    Physically abused: No    Forced sexual activity: No  Other  Topics Concern  . Not on file  Social History Narrative   Lives at [facility] since 04/14/2005   Widowed (husband expired 2014)   Has Living Will, POA, MOST   Exercise: water aerobic  5 times a week   Walks with walker   Never smoked   Drinks moderate amount of caffeinate beverages daily   Alcohol none       Functional Status Survey:    Family History  Problem Relation Age of Onset  . Heart disease Mother   . Stroke Mother   . Emphysema Father   . Arthritis Father   . Alcohol abuse Brother   . Heart disease Son   . Leukemia Son   . Esophageal cancer Neg Hx   . Stomach cancer Neg Hx   . Colon cancer Neg Hx     Health Maintenance  Topic Date Due  . TETANUS/TDAP  05/16/2018 (Originally 05/16/2014)  . INFLUENZA VACCINE  12/14/2017  . MAMMOGRAM  12/20/2017  . DEXA SCAN  Completed  . PNA vac Low Risk Adult  Completed    Allergies  Allergen Reactions  . Penicillins Other (See Comments)    CHILDHOOD REACTION  . Abilify [Aripiprazole] Rash    Outpatient Encounter Medications as of 09/20/2017  Medication Sig  . antiseptic oral rinse (BIOTENE) LIQD 15 mLs by Mouth Rinse route every  4 (four) hours as needed for dry mouth.   Marland Kitchen aspirin 81 MG tablet Take 81 mg by mouth daily.  . Calcium Carb-Cholecalciferol (CALTRATE 600+D) 600-800 MG-UNIT TABS Take 1 tablet by mouth 2 (two) times daily.  . Glucosamine HCl 1000 MG TABS Take 2,000 mg by mouth 2 (two) times daily.  Marland Kitchen ibandronate (BONIVA) 150 MG tablet Take 1 tablet (150 mg total) by mouth every 30 (thirty) days. Take in the morning with a full glass of water, on an empty stomach, and do not take anything else by mouth or lie down for the next 30 min.  Marland Kitchen LORazepam (ATIVAN) 0.5 MG tablet Take 0.5 mg by mouth 3 (three) times daily.  . Menthol, Topical Analgesic, (BIOFREEZE EX) Apply 1 application topically daily as needed.   Marland Kitchen acetaminophen-codeine (TYLENOL #3) 300-30 MG tablet Take 1 tablet by mouth every 6 (six) hours as needed for moderate pain.  Marland Kitchen buPROPion (WELLBUTRIN XL) 150 MG 24 hr tablet Take 450 mg by mouth daily.   . Capsaicin 0.1 % CREA Apply 1 application topically 3 (three) times daily as needed.  . chlorpheniramine-HYDROcodone (TUSSIONEX PENNKINETIC ER) 10-8 MG/5ML SUER Take 5 mLs by mouth every 12 (twelve) hours as needed for cough.   . cholecalciferol (VITAMIN D) 1000 UNITS tablet Take 1,000 Units by mouth daily.  . clonazePAM (KLONOPIN) 0.5 MG tablet Take 0.25 mg by mouth 2 (two) times daily. 8 am and 2 pm.  . clonazePAM (KLONOPIN) 0.5 MG tablet Take 0.25 mg by mouth daily as needed for anxiety (in addition to the scheduled dose).  Marland Kitchen escitalopram (LEXAPRO) 10 MG tablet Take 10 mg by mouth daily.  . Eyelid Cleansers (OCUSOFT EYELID CLEANSING) PADS Apply 1 application topically daily.  . meloxicam (MOBIC) 7.5 MG tablet TAKE 1 TABLET BY MOUTH EVERY DAY AS NEEDED FOR ARTHRITIS  . mirtazapine (REMERON) 30 MG tablet Take 30 mg by mouth at bedtime.  . Multiple Vitamin (MULTIVITAMIN WITH MINERALS) TABS Take 1 tablet by mouth daily.  Marland Kitchen NIFEdipine (PROCARDIA XL/ADALAT-CC) 60 MG 24 hr tablet TAKE 1 TABLET BY MOUTH EVERY DAY TO  CONTROL BLOOD  PRESSURE  . pantoprazole (PROTONIX) 40 MG tablet One daily to reduce stomach acid  . Polyethyl Glycol-Propyl Glycol (SYSTANE) 0.4-0.3 % SOLN Place 1 drop into both eyes 2 (two) times daily. Can also use as needed  . polyethylene glycol (MIRALAX / GLYCOLAX) packet Take 17 g by mouth daily.   . pravastatin (PRAVACHOL) 10 MG tablet Take 10 mg by mouth daily.   No facility-administered encounter medications on file as of 09/20/2017.     Review of Systems  Constitutional: Positive for appetite change. Negative for chills, diaphoresis, fever and unexpected weight change.  HENT: Positive for hearing loss. Negative for mouth sores, postnasal drip, sore throat and trouble swallowing.        Has hearing aids   Eyes: Negative for pain, discharge and visual disturbance.       Has corrective glasses. Sees her eye doctor once a year  Respiratory: Negative for cough, shortness of breath and wheezing.   Cardiovascular: Positive for leg swelling. Negative for chest pain and palpitations.       Compression stocking helps with her edema  Gastrointestinal: Positive for constipation. Negative for abdominal pain, blood in stool, diarrhea, nausea and vomiting.  Genitourinary: Negative for dysuria, frequency, hematuria and urgency.       Has nocturia and uses night time pad and pull up at night to avoid getting up at night  Musculoskeletal: Positive for arthralgias, back pain and gait problem.       Uses walker  Skin: Negative for rash.  Neurological: Negative for dizziness, seizures, syncope, weakness and numbness.       Occasional headache  Hematological: Does not bruise/bleed easily.  Psychiatric/Behavioral: Positive for decreased concentration and dysphoric mood. Negative for behavioral problems, self-injury and sleep disturbance. The patient is nervous/anxious.     Vitals:   09/20/17 1001  BP: 134/68  Pulse: 87  Resp: 16  Temp: (!) 97.5 F (36.4 C)  TempSrc: Oral  SpO2: 97%  Weight:  144 lb 4.8 oz (65.5 kg)  Height: '5\' 2"'$  (1.575 m)   Body mass index is 26.39 kg/m.   Wt Readings from Last 3 Encounters:  09/20/17 144 lb 4.8 oz (65.5 kg)  08/28/17 148 lb 12.8 oz (67.5 kg)  08/18/17 148 lb 12.8 oz (67.5 kg)   Physical Exam  Constitutional: She is oriented to person, place, and time. She appears well-developed and well-nourished. No distress.  HENT:  Head: Normocephalic and atraumatic.  Right Ear: External ear normal.  Left Ear: External ear normal.  Nose: Nose normal.  Mouth/Throat: Oropharynx is clear and moist. No oropharyngeal exudate.  Eyes: Pupils are equal, round, and reactive to light. Conjunctivae and EOM are normal. Right eye exhibits no discharge. Left eye exhibits no discharge.  Neck: Normal range of motion. Neck supple.  Cardiovascular: Normal rate, regular rhythm and intact distal pulses.  Murmur heard. Pulmonary/Chest: Effort normal and breath sounds normal. No respiratory distress. She has no wheezes. She has no rales.  Abdominal: Soft. Bowel sounds are normal. There is no tenderness. There is no rebound.  Musculoskeletal: She exhibits no edema or tenderness.  Limited ROM with both shoulder, no spine tenderness, unsteady gait, uses walker  Lymphadenopathy:    She has no cervical adenopathy.  Neurological: She is alert and oriented to person, place, and time. No cranial nerve deficit. She exhibits normal muscle tone.  09/20/17 MMSE 30/30, passed clock draw  Skin: Skin is warm and dry. She is not diaphoretic.  Psychiatric: She has a normal mood  and affect. Judgment and thought content normal.  Somewhat anxious    Labs reviewed: Basic Metabolic Panel: Recent Labs    10/17/16 1512 02/20/17 0750 07/10/17 08/04/17 0000  NA 137 140 143  143 141  K 4.5 4.3 4.2  4.2 4.3  CL 100 103 109 107  CO2 _0 GLUCOSE 172* 106*  --  106*  BUN _1 CREATININE 0.78 0.94* 0.9  0.89 0.91*  CALCIUM 9.6 10.0 9.3  9.3 9.7   Liver Function  Tests: Recent Labs    10/14/16 1337 10/17/16 1512 02/20/17 0750 07/10/17 08/04/17 0000  AST 38* _2 ALT 48* 37* _3 ALKPHOS 72 61  --  60  60  --   BILITOT 0.5 0.2 0.4 0.2 0.4  PROT 5.6* 6.2 6.9 5.8  5.8 6.5  ALBUMIN 3.3* 3.6  --  3.5  3.5  --    No results for input(s): LIPASE, AMYLASE in the last 8760 hours. No results for input(s): AMMONIA in the last 8760 hours. CBC: Recent Labs    10/17/16 1512 02/20/17 0750 07/10/17 08/04/17 0000  WBC 8.8 6.4 5.1  5.1 5.1  NEUTROABS 7,480 4,205  --   --   HGB 11.8 13.3 11.6*  11.6 12.8  HCT 36.1 40.1 34*  34 37.5  MCV 92.3 88.9  --  88.4  PLT 426* 275 180 248   Cardiac Enzymes: No results for input(s): CKTOTAL, CKMB, CKMBINDEX, TROPONINI in the last 8760 hours. BNP: Invalid input(s): POCBNP No results found for: HGBA1C Lab Results  Component Value Date   TSH 1.84 08/04/2017   No results found for: VITAMINB12 No results found for: FOLATE No results found for: IRON, TIBC, FERRITIN  Lipid Panel: Recent Labs    02/20/17 0750 08/04/17 0000  CHOL 174 169  HDL 63 48*  LDLCALC 89 99  TRIG 123 121  CHOLHDL 2.8 3.5   No results found for: HGBA1C  Procedures since last visit: No results found.   09/20/17 EKG- sinus rhythm, QTc 402 ms  Assessment/Plan  1. Essential hypertension Controlled BP, continue nifedipine and monitor. BMP reviewed.  - CMP with eGFR([facility]); Future  2. Hiatal hernia Continue pantoprazole and monitor  3. Bilateral hearing loss, unspecified hearing loss type Supportive care, monitor  4. Senile osteoporosis Continue boniva and calcium-vit d supplement  5. Primary osteoarthritis involving multiple joints Continue meloxicam and tylenol  6. Hyperlipidemia LDL goal <100 Reviewed lipid panel. Continue pravastatin  7. Major depression, recurrent, chronic ([place]) Follow with psychiatry, no changes made. EKG reviewed, normal QTc.   8. Annual physical exam the patient  was counseled regarding the appropriate prevention of dental and periodontal disease, diet, regular sustained exercise for at least 30 minutes 5 times per week, the proper use of sunscreen and protective clothing, tand recommended schedule for Gcholesterol, thyroid and diabetes screening. Yearly eye exam with ophthalmology. uptodate with immunization  9. Hyperglycemia Cut down on sweets and fried food.  - CMP with eGFR([facility]); Future - Hemoglobin A1c; Future   Labs/tests ordered:   Lab Orders     CMP with eGFR([facility])     Hemoglobin A1c   Next appointment: 3-4 months  Communication: reviewed care plan with patient     Blanchie Serve, MD Internal Medicine [facility] [place], [place] [address] Cell Phone (Monday-Friday 8 am - 5 pm): [phone] On Call: [phone]  and follow prompts after 5 pm and on weekends Office Phone: [phone] Office Fax: [phone]

## 2017-09-25 DIAGNOSIS — F411 Generalized anxiety disorder: Secondary | ICD-10-CM | POA: Diagnosis not present

## 2017-09-25 DIAGNOSIS — F332 Major depressive disorder, recurrent severe without psychotic features: Secondary | ICD-10-CM | POA: Diagnosis not present

## 2017-09-28 ENCOUNTER — Non-Acute Institutional Stay: Payer: Medicare Other | Admitting: Family

## 2017-09-28 ENCOUNTER — Encounter: Payer: Self-pay | Admitting: Family

## 2017-09-28 VITALS — BP 110/60 | HR 87 | Temp 97.6°F | Resp 18 | Ht 62.0 in | Wt 144.3 lb

## 2017-09-28 DIAGNOSIS — K5901 Slow transit constipation: Secondary | ICD-10-CM

## 2017-09-28 DIAGNOSIS — E785 Hyperlipidemia, unspecified: Secondary | ICD-10-CM | POA: Diagnosis not present

## 2017-09-28 DIAGNOSIS — K219 Gastro-esophageal reflux disease without esophagitis: Secondary | ICD-10-CM | POA: Diagnosis not present

## 2017-09-28 DIAGNOSIS — F339 Major depressive disorder, recurrent, unspecified: Secondary | ICD-10-CM

## 2017-09-28 DIAGNOSIS — F411 Generalized anxiety disorder: Secondary | ICD-10-CM

## 2017-09-28 MED ORDER — DOCUSATE SODIUM 100 MG PO CAPS: 100.0000 mg | ORAL_CAPSULE | Freq: Every day | ORAL | 0 refills | Status: DC

## 2017-09-28 NOTE — Progress Notes (Signed)
Location:  [place] of Service:  Clinic (12) Provider: Dinah Ngetich FNP-C   Blanchie Serve, MD  Patient Care Team: Blanchie Serve, MD as PCP - General (Internal Medicine) [place], [facility] Clent Jacks, MD as Consulting Physician (Ophthalmology) Milus Banister, MD as Consulting Physician (Gastroenterology) Norma Fredrickson, MD as Consulting Physician (Psychiatry) Jessy Oto, MD as Consulting Physician (Orthopedic Surgery) Bjorn Loser, MD as Consulting Physician (Urology)  Extended Emergency Contact Information Primary Emergency Contact: [place], [place] [address] [address] of [place] Phone: [phone] Mobile Phone: [phone] Relation: Son  Code Status:  DNR Goals of care: Advanced Directive information Advanced Directives 09/28/2017  Does Patient Have a Medical Advance Directive? Yes  Type of Paramedic of [place];Out of facility DNR (pink MOST or yellow form);Living will  Does patient want to make changes to medical advance directive? -  Copy of [place] in Chart? Yes  Pre-existing out of facility DNR order (yellow form or pink MOST form) Pink MOST form placed in chart (order not valid for inpatient use)     Chief Complaint  Patient presents with  . Medical Management of Chronic Issues    routine; also requests rectal exam after hard stool yesterday    HPI:  Pt is a 84 y.o. female seen today [place] [facility] for medical management of chronic diseases.she has a medical history of HTN,Hyperlipidemia,PAD,GERD,Depression,GAD,DDD,OA,Osteoporosis,OAB among other conditions.she states hard hard stool yesterday had to disimpacted herself.While removing stool she felt something soft inside the rectum and would like it provider to do a rectal exam.She denies any pain in the rectum or blood in the stool.she states her appetite has been good.her weight has been  stable.No fall episode reported.she continues to exercise in the facility gymn.she follow up with Psychiatry service for depression and anxiety.     Past Medical History:  Diagnosis Date  . Abnormal mammogram, unspecified 04/19/2011  . Allergy   . Anxiety   . Backache, unspecified 01/21/2005  . Cervicalgia 08/20/2013  . Chronic kidney disease, stage II (mild) 04/03/2009  . Chronic rhinitis 09/28/2010  . DDD (degenerative disc disease)   . Depression   . Dermatophytosis of nail 01/21/2005  . Diverticulosis of colon (without mention of hemorrhage) 06/17/2005  . Edema 11/26/2004  . GERD (gastroesophageal reflux disease)   . Hiatal hernia   . High cholesterol   . Hyperglycemia 11/18/2014  . Hypertension   . Insomnia, unspecified 10/18/2011  . Left inguinal hernia 04/24/2014  . Nocturia 09/19/2008  . Osteoporosis, senile 11/18/2014  . Other abnormal blood chemistry 04/03/2007  . Pain in joint, pelvic region and thigh 11/12/2011  . Pain in joint, shoulder region 08/15/2006  . Pain in limb 08/26/2005  . Palpitations 09/29/2009  . PAOD (peripheral arterial occlusive disease) ([place]) 11/18/2014   Right leg; diminished popliteal, dorsalis pedis, and posterior tibial artery pulsations   . Postmenopausal atrophic vaginitis 12/14/2004  . Scoliosis   . Senile osteoporosis 12/14/2004  . Tension headache 10/04/2005  . Unspecified cataract 11/26/2004  . Unspecified constipation 01/21/2005  . Unspecified urinary incontinence 11/26/2004  . Vaginal stenosis 11/18/2014   Tight band about 1/2 inches into the vagina which will not allow passage of my index finger.    Past Surgical History:  Procedure Laterality Date  . ABDOMINAL HYSTERECTOMY  1969   Dr.Braun   . bilateral eyelid surgery  2005   Dr.Scott  .  BILATERAL OOPHORECTOMY  2002  . BREAST SURGERY     benign tumor removal, left breast 1969  . CATARACT EXTRACTION     Right  . CHOLECYSTECTOMY  1990   Dr.Moore   . COLONOSCOPY  3/18/20008   inflammation, diverticular  associated colitis -Dr. Ardis Hughs  . COSMETIC SURGERY    . ESOPHAGOGASTRODUODENOSCOPY N/A 01/24/2014   Procedure: ESOPHAGOGASTRODUODENOSCOPY (EGD);  Surgeon: Irene Shipper, MD;  [facility] ENDOSCOPY;  Service: Endoscopy;  Laterality: N/A;  . ESOPHAGOGASTRODUODENOSCOPY ENDOSCOPY  04/04/14   Dr. Ardis Hughs  . EYE SURGERY    . FOREIGN BODY REMOVAL N/A 01/24/2014   Procedure: FOREIGN BODY REMOVAL;  Surgeon: Irene Shipper, MD;  Location: [place];  Service: Endoscopy;  Laterality: N/A;  . HIP PINNING,CANNULATED  11/07/2011   Procedure: CANNULATED HIP PINNING;  Surgeon: Jessy Oto, MD;  Location: [facility];  Service: Orthopedics;  Laterality: Left;  . ORIF FEMORAL NECK FRACTURE W/ [facility]  11/07/2011   Dr.Nitka   . TONSILLECTOMY     Dr.Joe Tamala Julian  . TUBAL LIGATION    . VAGINAL PROLAPSE REPAIR  2002   Dr.Horback     Allergies  Allergen Reactions  . Penicillins Other (See Comments)    CHILDHOOD REACTION  . Abilify [Aripiprazole] Rash    Allergies as of 09/28/2017      Reactions   Penicillins Other (See Comments)   CHILDHOOD REACTION   Abilify [aripiprazole] Rash      Medication List        Accurate as of 09/28/17 11:09 AM. Always use your most recent med list.          antiseptic oral rinse Liqd 15 mLs by Mouth Rinse route every 4 (four) hours as needed for dry mouth.   aspirin 81 MG tablet Take 81 mg by mouth daily.   BIOFREEZE EX Apply 1 application topically daily as needed.   buPROPion 150 MG 24 hr tablet Commonly known as:  WELLBUTRIN XL Take 150 mg by mouth daily.   CALTRATE 600+D 600-800 MG-UNIT Tabs Generic drug:  Calcium Carb-Cholecalciferol Take 1 tablet by mouth 2 (two) times daily.   Capsaicin 0.1 % Crea Apply 1 application topically 3 (three) times daily as needed.   cholecalciferol 1000 units tablet Commonly known as:  VITAMIN D Take 1,000 Units by mouth daily.   docusate sodium 100 MG capsule Commonly known as:  COLACE Take 1 capsule (100 mg total) by  mouth daily.   Glucosamine HCl 1000 MG Tabs Take 2,000 mg by mouth 2 (two) times daily.   ibandronate 150 MG tablet Commonly known as:  BONIVA Take 1 tablet (150 mg total) by mouth every 30 (thirty) days. Take in the morning with a full glass of water, on an empty stomach, and do not take anything else by mouth or lie down for the next 30 min.   LORazepam 0.5 MG tablet Commonly known as:  ATIVAN Take 0.5 mg by mouth 3 (three) times daily. Script to be provided by [employer]   meloxicam 7.5 MG tablet Commonly known as:  MOBIC TAKE 1 TABLET BY MOUTH EVERY DAY AS NEEDED FOR ARTHRITIS   mirtazapine 30 MG tablet Commonly known as:  REMERON Take 30 mg by mouth at bedtime.   multivitamin with minerals Tabs tablet Take 1 tablet by mouth daily.   NIFEdipine 60 MG 24 hr tablet Commonly known as:  PROCARDIA XL/ADALAT-CC TAKE 1 TABLET BY MOUTH EVERY DAY TO CONTROL BLOOD PRESSURE   OCUSOFT EYELID CLEANSING  Pads Apply 1 application topically daily.   pantoprazole 40 MG tablet Commonly known as:  PROTONIX One daily to reduce stomach acid   polyethylene glycol packet Commonly known as:  MIRALAX / GLYCOLAX Take 17 g by mouth daily.   pravastatin 10 MG tablet Commonly known as:  PRAVACHOL Take 10 mg by mouth daily.   SYSTANE 0.4-0.3 % Soln Generic drug:  Polyethyl Glycol-Propyl Glycol Place 1 drop into both eyes 2 (two) times daily. Can also use as needed   vortioxetine HBr 10 MG Tabs tablet Commonly known as:  TRINTELLIX Take 10 mg by mouth at bedtime.       Review of Systems  Constitutional: Negative for activity change, appetite change, chills, fatigue and fever.  HENT: Negative for congestion, rhinorrhea, sinus pressure, sinus pain, sneezing and sore throat.   Eyes: Positive for visual disturbance. Negative for pain, discharge, redness and itching.       Wears eye glasses  Respiratory: Negative for cough, chest tightness, shortness of breath and wheezing.     Cardiovascular: Negative for chest pain, palpitations and leg swelling.  Gastrointestinal: Positive for constipation. Negative for abdominal distention, abdominal pain, blood in stool, diarrhea, nausea, rectal pain and vomiting.  Endocrine: Negative for cold intolerance, heat intolerance, polydipsia, polyphagia and polyuria.  Genitourinary: Negative for dysuria, flank pain, frequency and urgency.  Musculoskeletal: Positive for arthralgias, back pain and gait problem.  Skin: Negative for color change, pallor, rash and wound.  Neurological: Negative for dizziness, light-headedness and headaches.  Hematological: Does not bruise/bleed easily.  Psychiatric/Behavioral: Negative for agitation, confusion, hallucinations and sleep disturbance. The patient is nervous/anxious.     Immunization History  Administered Date(s) Administered  . Influenza Whole 02/14/2012, 02/14/2013  . Influenza-Unspecified 02/27/2014, 02/12/2015, 02/25/2016, 03/15/2017  . Pneumococcal Conjugate-13 07/13/2015  . Pneumococcal Polysaccharide-23 05/16/1998  . Td 05/16/2004  . Zoster 05/16/2005   Pertinent  Health Maintenance Due  Topic Date Due  . INFLUENZA VACCINE  12/14/2017  . MAMMOGRAM  12/20/2017  . DEXA SCAN  Completed  . PNA vac Low Risk Adult  Completed   Fall Risk  04/21/2017 10/17/2016 10/14/2016 10/13/2016 05/31/2016  Falls in the past year? No No No No No    Vitals:   09/28/17 1031  BP: 110/60  Pulse: 87  Resp: 18  Temp: 97.6 F (36.4 C)  TempSrc: Oral  SpO2: 97%  Weight: 144 lb 4.8 oz (65.5 kg)  Height: 5\' 2"  (1.575 m)   Body mass index is 26.39 kg/m. Physical Exam  Constitutional: She is oriented to person, place, and time. She appears well-developed and well-nourished.  Elderly in no acute distress   HENT:  Head: Normocephalic.  Right Ear: External ear normal.  Left Ear: External ear normal.  Mouth/Throat: Oropharynx is clear and moist. No oropharyngeal exudate.  Eyes: Pupils are equal,  round, and reactive to light. Conjunctivae and EOM are normal. Right eye exhibits no discharge. Left eye exhibits no discharge. No scleral icterus.  Eye glasses in place   Neck: Normal range of motion. No JVD present. No thyromegaly present.  Cardiovascular: Normal rate, regular rhythm, normal heart sounds and intact distal pulses. Exam reveals no gallop and no friction rub.  Pulmonary/Chest: Effort normal and breath sounds normal. No respiratory distress. She has no wheezes. She has no rales. She exhibits no tenderness.  Abdominal: Soft. Bowel sounds are normal. She exhibits no distension and no mass. There is no tenderness. There is no rebound and no guarding.  Genitourinary:  Genitourinary Comments:  Bilateral Breast exam negative for tenderness or mass. Rectal exam: no hemorrhoid or blood noted.No palpable mass. Sphincter muscle intact.No tenderness.   Musculoskeletal: She exhibits no tenderness.  Unsteady gait self propels on wheelchair for long distance and uses walker within the room. Left left trace edema.  Lymphadenopathy:    She has no cervical adenopathy.  Neurological: She is oriented to person, place, and time. Gait abnormal.  Skin: Skin is warm and dry. No rash noted. No erythema. No pallor.  Psychiatric: Her speech is normal and behavior is normal. Her mood appears anxious.  Nursing note and vitals reviewed.   Labs reviewed: Recent Labs    10/17/16 1512 02/20/17 0750 07/10/17 08/04/17 0000  NA 137 140 143  143 141  K 4.5 4.3 4.2  4.2 4.3  CL 100 103 109 107  CO2 25 27 26 28   GLUCOSE 172* 106*  --  106*  BUN 13 15 21 19   CREATININE 0.78 0.94* 0.9  0.89 0.91*  CALCIUM 9.6 10.0 9.3  9.3 9.7   Recent Labs    10/14/16 1337 10/17/16 1512 02/20/17 0750 07/10/17 08/04/17 0000  AST 38* 25 19 18  18 19   ALT 48* 37* 13 13  13 13   ALKPHOS 72 61  --  60  60  --   BILITOT 0.5 0.2 0.4 0.2 0.4  PROT 5.6* 6.2 6.9 5.8  5.8 6.5  ALBUMIN 3.3* 3.6  --  3.5  3.5  --     Recent Labs    10/17/16 1512 02/20/17 0750 07/10/17 08/04/17 0000  WBC 8.8 6.4 5.1  5.1 5.1  NEUTROABS 7,480 4,205  --   --   HGB 11.8 13.3 11.6*  11.6 12.8  HCT 36.1 40.1 34*  34 37.5  MCV 92.3 88.9  --  88.4  PLT 426* 275 180 248   Lab Results  Component Value Date   TSH 1.84 08/04/2017   No results found for: HGBA1C Lab Results  Component Value Date   CHOL 169 08/04/2017   HDL 48 (L) 08/04/2017   LDLCALC 99 08/04/2017   TRIG 121 08/04/2017   CHOLHDL 3.5 08/04/2017    Significant Diagnostic Results in last 30 days:  No results found.  Assessment/Plan 1. Slow transit constipation Had hard stool requiring disimpaction a day ago.Reported feeling something in the rectum.Rectal exam done today was negative.continue on Miralax 17 gm daily and add Docusate sodium 100 mg capsule daily.Encouraged to drink plenty of fluid and fiber in diet. Continue to monitor.   2. Gastroesophageal reflux disease without esophagitis Asymptomatic.continue on Protonix 40 mg tablet daily.   3. Hyperlipidemia LDL goal <100 Lab Results  Component Value Date   CHOL 169 08/04/2017   HDL 48 (L) 08/04/2017   LDLCALC 99 08/04/2017   TRIG 121 08/04/2017   CHOLHDL 3.5 08/04/2017  LDL at goal pravastatin dose in reduced in march,2019 will continue to monitor and check Lipid panel periodically.   4. Major depression, recurrent, chronic Mood stable.Wellbutrin,Remeron,Ativan and Trintellix managed by psychiatrist. Continue to monitor. Participates in facility activities and exercises.continue to monitor for mood changes.    5. GAD (generalized anxiety disorder) Stable.continue on Ativan.continue to follow up with psychiatry service.   Family/ staff Communication: Reviewed plan of care with patient and facility Nurse.   Labs/tests ordered: None   Dinah C Ngetich, NP

## 2017-09-28 NOTE — Patient Instructions (Signed)
Drink plenty of fluid.Notify provide if constipation persist.  Take colace 100 mg capsule daily  Constipation, Adult Constipation is when a person:  Poops (has a bowel movement) fewer times in a week than normal.  Has a hard time pooping.  Has poop that is dry, hard, or bigger than normal.  Follow these instructions at home: Eating and drinking   Eat foods that have a lot of fiber, such as: ? Fresh fruits and vegetables. ? Whole grains. ? Beans.  Eat less of foods that are high in fat, low in fiber, or overly processed, such as: ? [place] fries. ? Hamburgers. ? Cookies. ? Candy. ? Soda.  Drink enough fluid to keep your pee (urine) clear or pale yellow. General instructions  Exercise regularly or as told by your doctor.  Go to the restroom when you feel like you need to poop. Do not hold it in.  Take over-the-counter and prescription medicines only as told by your doctor. These include any fiber supplements.  Do pelvic floor retraining exercises, such as: ? Doing deep breathing while relaxing your lower belly (abdomen). ? Relaxing your pelvic floor while pooping.  Watch your condition for any changes.  Keep all follow-up visits as told by your doctor. This is important. Contact a doctor if:  You have pain that gets worse.  You have a fever.  You have not pooped for 4 days.  You throw up (vomit).  You are not hungry.  You lose weight.  You are bleeding from the anus.  You have thin, pencil-like poop (stool). Get help right away if:  You have a fever, and your symptoms suddenly get worse.  You leak poop or have blood in your poop.  Your belly feels hard or bigger than normal (is bloated).  You have very bad belly pain.  You feel dizzy or you faint. This information is not intended to replace advice given to you by your health care provider. Make sure you discuss any questions you have with your health care provider. Document Released: 10/19/2007  Document Revised: 11/20/2015 Document Reviewed: 10/21/2015 Elsevier Interactive Patient Education  2018 [employer].

## 2017-10-05 ENCOUNTER — Telehealth (INDEPENDENT_AMBULATORY_CARE_PROVIDER_SITE_OTHER): Payer: Self-pay | Admitting: Specialist

## 2017-10-05 NOTE — Telephone Encounter (Signed)
Tina Hampton,  This patient called and requested a RX for PT @ [place] is the company she wants to use. She stated she just finished a couple weeks ago and that therapy helped her tremendously.  Please call patient to advise. [phone]

## 2017-10-05 NOTE — Telephone Encounter (Signed)
This patient called and requested a RX for PT @ [facility] is the company she wants to use. She stated she just finished a couple weeks ago and that therapy helped her tremendously.---please advise

## 2017-10-06 DIAGNOSIS — F411 Generalized anxiety disorder: Secondary | ICD-10-CM | POA: Diagnosis not present

## 2017-10-12 DIAGNOSIS — F411 Generalized anxiety disorder: Secondary | ICD-10-CM | POA: Diagnosis not present

## 2017-10-26 DIAGNOSIS — F411 Generalized anxiety disorder: Secondary | ICD-10-CM | POA: Diagnosis not present

## 2017-11-03 DIAGNOSIS — F411 Generalized anxiety disorder: Secondary | ICD-10-CM | POA: Diagnosis not present

## 2017-11-15 DIAGNOSIS — F332 Major depressive disorder, recurrent severe without psychotic features: Secondary | ICD-10-CM | POA: Diagnosis not present

## 2017-11-15 DIAGNOSIS — F411 Generalized anxiety disorder: Secondary | ICD-10-CM | POA: Diagnosis not present

## 2017-11-24 DIAGNOSIS — F411 Generalized anxiety disorder: Secondary | ICD-10-CM | POA: Diagnosis not present

## 2017-11-27 ENCOUNTER — Encounter: Payer: Self-pay | Admitting: Family

## 2017-11-27 ENCOUNTER — Non-Acute Institutional Stay: Payer: Medicare Other | Admitting: Family

## 2017-11-27 DIAGNOSIS — M48062 Spinal stenosis, lumbar region with neurogenic claudication: Secondary | ICD-10-CM | POA: Diagnosis not present

## 2017-11-27 NOTE — Progress Notes (Signed)
Location:  [place] Room Number: 8 Place of Service:  ALF [phone]) Provider: Lillith Mcneff FNP-C  Blanchie Serve, MD  Patient Care Team: Blanchie Serve, MD as PCP - General (Internal Medicine) Melina Modena, [facility] Clent Jacks, MD as Consulting Physician (Ophthalmology) Milus Banister, MD as Consulting Physician (Gastroenterology) Norma Fredrickson, MD as Consulting Physician (Psychiatry) Jessy Oto, MD as Consulting Physician (Orthopedic Surgery) Bjorn Loser, MD as Consulting Physician (Urology)  Extended Emergency Contact Information Primary Emergency Contact: [address] [place],  [address] Johnnette Litter of [place] Phone: [phone] Mobile Phone: [phone] Relation: Son  Code Status:  DNR Goals of care: Advanced Directive information Advanced Directives 11/27/2017  Does Patient Have a Medical Advance Directive? Yes  Type of Paramedic of [place];Out of facility DNR (pink MOST or yellow form);Living will  Does patient want to make changes to medical advance directive? -  Copy of [place] in Chart? Yes  Pre-existing out of facility DNR order (yellow form or pink MOST form) Pink MOST form placed in chart (order not valid for inpatient use)     Chief Complaint  Patient presents with  . Acute Visit    increased back pain    HPI:  Pt is a 84 y.o. female seen today at [facility] for an acute visit for evaluation of increased lower back pain.she seen in her room today with facility Nurse present at bedside.she complains of worsening intermittent lower back pain.Pain describe as worst with walking,bending and attempts to stand up straight.she states has had lower back pain due to her spinal scoliosis.she follows up with orthopedic specialist Dr.Nitka Jeneen Rinks.she is currently on mobic 7.5 mg tablet daily.she states worked with Physical Therapy in the past which seemed to have help  with the pain.she doesn't want to be referred to Orthopedic for now.    Past Medical History:  Diagnosis Date  . Abnormal mammogram, unspecified 04/19/2011  . Allergy   . Anxiety   . Backache, unspecified 01/21/2005  . Cervicalgia 08/20/2013  . Chronic kidney disease, stage II (mild) 04/03/2009  . Chronic rhinitis 09/28/2010  . DDD (degenerative disc disease)   . Depression   . Dermatophytosis of nail 01/21/2005  . Diverticulosis of colon (without mention of hemorrhage) 06/17/2005  . Edema 11/26/2004  . GERD (gastroesophageal reflux disease)   . Hiatal hernia   . High cholesterol   . Hyperglycemia 11/18/2014  . Hypertension   . Insomnia, unspecified 10/18/2011  . Left inguinal hernia 04/24/2014  . Nocturia 09/19/2008  . Osteoporosis, senile 11/18/2014  . Other abnormal blood chemistry 04/03/2007  . Pain in joint, pelvic region and thigh 11/12/2011  . Pain in joint, shoulder region 08/15/2006  . Pain in limb 08/26/2005  . Palpitations 09/29/2009  . PAOD (peripheral arterial occlusive disease) ([place]) 11/18/2014   Right leg; diminished popliteal, dorsalis pedis, and posterior tibial artery pulsations   . Postmenopausal atrophic vaginitis 12/14/2004  . Scoliosis   . Senile osteoporosis 12/14/2004  . Tension headache 10/04/2005  . Unspecified cataract 11/26/2004  . Unspecified constipation 01/21/2005  . Unspecified urinary incontinence 11/26/2004  . Vaginal stenosis 11/18/2014   Tight band about 1/2 inches into the vagina which will not allow passage of my index finger.    Past Surgical History:  Procedure Laterality Date  . ABDOMINAL HYSTERECTOMY  1969   Dr.Braun   . bilateral eyelid surgery  2005   Dr.Scott  .  BILATERAL OOPHORECTOMY  2002  . BREAST SURGERY     benign tumor removal, left breast 1969  . CATARACT EXTRACTION     Right  . CHOLECYSTECTOMY  1990   Dr.Moore   . COLONOSCOPY  3/18/20008   inflammation, diverticular associated colitis -Dr. Ardis Hughs  . COSMETIC SURGERY    .  ESOPHAGOGASTRODUODENOSCOPY N/A 01/24/2014   Procedure: ESOPHAGOGASTRODUODENOSCOPY (EGD);  Surgeon: Irene Shipper, MD;  Location: [facility] ENDOSCOPY;  Service: Endoscopy;  Laterality: N/A;  . ESOPHAGOGASTRODUODENOSCOPY ENDOSCOPY  04/04/14   Dr. Ardis Hughs  . EYE SURGERY    . FOREIGN BODY REMOVAL N/A 01/24/2014   Procedure: FOREIGN BODY REMOVAL;  Surgeon: Irene Shipper, MD;  Location: [facility];  Service: Endoscopy;  Laterality: N/A;  . HIP PINNING,CANNULATED  11/07/2011   Procedure: CANNULATED HIP PINNING;  Surgeon: Jessy Oto, MD;  Location: [facility] ORS;  Service: Orthopedics;  Laterality: Left;  . ORIF FEMORAL NECK FRACTURE W/ [facility]  11/07/2011   Dr.Nitka   . TONSILLECTOMY     Dr.Joe Tamala Julian  . TUBAL LIGATION    . VAGINAL PROLAPSE REPAIR  2002   Dr.Horback     Allergies  Allergen Reactions  . Penicillins Other (See Comments)    CHILDHOOD REACTION  . Abilify [Aripiprazole] Rash    Outpatient Encounter Medications as of 11/27/2017  Medication Sig  . acetaminophen-codeine (TYLENOL #3) 300-30 MG tablet Take 1 tablet by mouth every 6 (six) hours as needed for moderate pain.  Marland Kitchen antiseptic oral rinse (BIOTENE) LIQD 15 mLs by Mouth Rinse route every 4 (four) hours as needed for dry mouth.   Marland Kitchen aspirin 81 MG tablet Take 81 mg by mouth daily.  Marland Kitchen buPROPion (WELLBUTRIN XL) 150 MG 24 hr tablet Take 300 mg by mouth daily.   . Calcium Carb-Cholecalciferol (CALTRATE 600+D) 600-800 MG-UNIT TABS Take 1 tablet by mouth 2 (two) times daily.  . cholecalciferol (VITAMIN D) 1000 UNITS tablet Take 1,000 Units by mouth daily.  . clonazePAM (KLONOPIN) 0.5 MG tablet Take 0.25 mg by mouth 4 (four) times daily.  Marland Kitchen docusate sodium (COLACE) 100 MG capsule Take 1 capsule (100 mg total) by mouth daily.  . Eyelid Cleansers (OCUSOFT EYELID CLEANSING) PADS Apply 1 application topically daily.  . Glucosamine HCl 1000 MG TABS Take 2,000 mg by mouth 2 (two) times daily.  Marland Kitchen ibandronate (BONIVA) 150 MG tablet Take 1 tablet (150 mg total)  by mouth every 30 (thirty) days. Take in the morning with a full glass of water, on an empty stomach, and do not take anything else by mouth or lie down for the next 30 min.  Marland Kitchen LORazepam (ATIVAN) 0.5 MG tablet Take 0.5 mg by mouth 3 (three) times daily. Script to be provided by [employer]  . meloxicam (MOBIC) 7.5 MG tablet TAKE 1 TABLET BY MOUTH EVERY DAY AS NEEDED FOR ARTHRITIS  . Menthol, Topical Analgesic, (BIOFREEZE EX) Apply 1 application topically daily as needed.   . mirtazapine (REMERON) 30 MG tablet Take 30 mg by mouth at bedtime.  . Multiple Vitamin (MULTIVITAMIN WITH MINERALS) TABS Take 1 tablet by mouth daily.  Marland Kitchen NIFEdipine (PROCARDIA XL/ADALAT-CC) 60 MG 24 hr tablet TAKE 1 TABLET BY MOUTH EVERY DAY TO CONTROL BLOOD PRESSURE  . pantoprazole (PROTONIX) 40 MG tablet One daily to reduce stomach acid  . Polyethyl Glycol-Propyl Glycol (SYSTANE) 0.4-0.3 % SOLN Place 1 drop into both eyes 2 (two) times daily. Can also use as needed  . polyethylene glycol (MIRALAX / GLYCOLAX) packet Take 17  g by mouth daily.   . pravastatin (PRAVACHOL) 10 MG tablet Take 10 mg by mouth daily.  Marland Kitchen vortioxetine HBr (TRINTELLIX) 10 MG TABS tablet Take 10 mg by mouth at bedtime.  . [DISCONTINUED] Capsaicin 0.1 % CREA Apply 1 application topically 3 (three) times daily as needed.   No facility-administered encounter medications on file as of 11/27/2017.     Review of Systems  Constitutional: Negative for appetite change, chills, fatigue and fever.  Respiratory: Negative for cough, chest tightness, shortness of breath and wheezing.   Cardiovascular: Negative for chest pain, palpitations and leg swelling.  Gastrointestinal: Negative for abdominal distention, abdominal pain, constipation, diarrhea, nausea and vomiting.  Genitourinary: Negative for difficulty urinating, dysuria, flank pain, frequency and urgency.  Musculoskeletal: Positive for arthralgias, back pain and gait problem.  Skin: Negative for color  change, pallor, rash and wound.  Psychiatric/Behavioral: Negative for agitation, confusion and sleep disturbance.    Immunization History  Administered Date(s) Administered  . Influenza Whole 02/14/2012, 02/14/2013  . Influenza-Unspecified 02/27/2014, 02/12/2015, 02/25/2016, 03/15/2017  . Pneumococcal Conjugate-13 07/13/2015  . Pneumococcal Polysaccharide-23 05/16/1998  . Td 05/16/2004  . Zoster 05/16/2005   Pertinent  Health Maintenance Due  Topic Date Due  . INFLUENZA VACCINE  12/14/2017  . MAMMOGRAM  12/20/2017  . DEXA SCAN  Completed  . PNA vac Low Risk Adult  Completed   Fall Risk  04/21/2017 10/17/2016 10/14/2016 10/13/2016 05/31/2016  Falls in the past year? No No No No No   Functional Status Survey:    Vitals:   11/27/17 1350  BP: 134/80  Pulse: 68  Resp: 20  Temp: 98 F (36.7 C)  SpO2: 96%  Weight: 142 lb 3.2 oz (64.5 kg)  Height: 5\' 2"  (1.575 m)   Body mass index is 26.01 kg/m. Physical Exam  Constitutional: She is oriented to person, place, and time. She appears well-developed and well-nourished.  Elderly in no acute distress   HENT:  Head: Normocephalic.  Mouth/Throat: Oropharynx is clear and moist. No oropharyngeal exudate.  Eyes: Pupils are equal, round, and reactive to light. Conjunctivae and EOM are normal. Right eye exhibits no discharge. Left eye exhibits no discharge. No scleral icterus.  Neck: Normal range of motion. No JVD present. No tracheal deviation present. No thyromegaly present.  Cardiovascular: Intact distal pulses. Exam reveals no gallop and no friction rub.  Murmur heard. Pulmonary/Chest: Effort normal and breath sounds normal. No respiratory distress. She has no wheezes. She has no rales.  Abdominal: Soft. Bowel sounds are normal. She exhibits no distension. There is no tenderness. There is no rebound and no guarding.  Musculoskeletal: She exhibits deformity. She exhibits no edema.  Moves x 4 extremities.Unsteady gait ambulates with  walker.spinal scoliosis paraspine none tender to palpation.  Lymphadenopathy:    She has no cervical adenopathy.  Neurological: She is oriented to person, place, and time. Gait abnormal.  Skin: Skin is warm and dry. No rash noted. No erythema. No pallor.  Psychiatric: She has a normal mood and affect. Her speech is normal and behavior is normal.    Labs reviewed: Recent Labs    02/20/17 0750 07/10/17 08/04/17 0000  NA 140 143  143 141  K 4.3 4.2  4.2 4.3  CL 103 109 107  CO2 27 26 28   GLUCOSE 106*  --  106*  BUN 15 21 19   CREATININE 0.94* 0.9  0.89 0.91*  CALCIUM 10.0 9.3  9.3 9.7   Recent Labs    02/20/17 0750 07/10/17  08/04/17 0000  AST 19 18  18 19   ALT 13 13  13 13   ALKPHOS  --  60  60  --   BILITOT 0.4 0.2 0.4  PROT 6.9 5.8  5.8 6.5  ALBUMIN  --  3.5  3.5  --    Recent Labs    02/20/17 0750 07/10/17 08/04/17 0000  WBC 6.4 5.1  5.1 5.1  NEUTROABS 4,205  --   --   HGB 13.3 11.6*  11.6 12.8  HCT 40.1 34*  34 37.5  MCV 88.9  --  88.4  PLT 275 180 248   Lab Results  Component Value Date   TSH 1.84 08/04/2017   No results found for: HGBA1C Lab Results  Component Value Date   CHOL 169 08/04/2017   HDL 48 (L) 08/04/2017   LDLCALC 99 08/04/2017   TRIG 121 08/04/2017   CHOLHDL 3.5 08/04/2017    Significant Diagnostic Results in last 30 days:  No results found.  Assessment/Plan  Spinal stenosis of lumbar region with neurogenic claudication Negative spinal tenderness to palpation.worsening pain request to restart physical therapy which has been effective in the past.declines lidocaine patch.continue on Mobic 7.5 mg tablet daily.PT/OT to evaluate and treat as indicated for lower back pain,gait stability,exercise and muscle strengthening.continue Fall and safety precautions.  Family/ staff Communication: Reviewed plan of care with patient and facility Nurse.   Labs/tests ordered: None   Oracio Galen C Kaleth Koy, NP

## 2017-11-29 DIAGNOSIS — R2681 Unsteadiness on feet: Secondary | ICD-10-CM | POA: Diagnosis not present

## 2017-11-29 DIAGNOSIS — M545 Low back pain: Secondary | ICD-10-CM | POA: Diagnosis not present

## 2017-11-29 DIAGNOSIS — R29898 Other symptoms and signs involving the musculoskeletal system: Secondary | ICD-10-CM | POA: Diagnosis not present

## 2017-11-30 DIAGNOSIS — R2681 Unsteadiness on feet: Secondary | ICD-10-CM | POA: Diagnosis not present

## 2017-11-30 DIAGNOSIS — R29898 Other symptoms and signs involving the musculoskeletal system: Secondary | ICD-10-CM | POA: Diagnosis not present

## 2017-11-30 DIAGNOSIS — M545 Low back pain: Secondary | ICD-10-CM | POA: Diagnosis not present

## 2017-12-01 DIAGNOSIS — R2681 Unsteadiness on feet: Secondary | ICD-10-CM | POA: Diagnosis not present

## 2017-12-01 DIAGNOSIS — R29898 Other symptoms and signs involving the musculoskeletal system: Secondary | ICD-10-CM | POA: Diagnosis not present

## 2017-12-01 DIAGNOSIS — M545 Low back pain: Secondary | ICD-10-CM | POA: Diagnosis not present

## 2017-12-04 ENCOUNTER — Encounter: Payer: Self-pay | Admitting: Internal Medicine

## 2017-12-04 DIAGNOSIS — R2681 Unsteadiness on feet: Secondary | ICD-10-CM | POA: Diagnosis not present

## 2017-12-04 DIAGNOSIS — R29898 Other symptoms and signs involving the musculoskeletal system: Secondary | ICD-10-CM | POA: Diagnosis not present

## 2017-12-04 DIAGNOSIS — M545 Low back pain: Secondary | ICD-10-CM | POA: Diagnosis not present

## 2017-12-06 DIAGNOSIS — H40053 Ocular hypertension, bilateral: Secondary | ICD-10-CM | POA: Diagnosis not present

## 2017-12-06 DIAGNOSIS — F411 Generalized anxiety disorder: Secondary | ICD-10-CM | POA: Diagnosis not present

## 2017-12-06 DIAGNOSIS — R2681 Unsteadiness on feet: Secondary | ICD-10-CM | POA: Diagnosis not present

## 2017-12-06 DIAGNOSIS — H353131 Nonexudative age-related macular degeneration, bilateral, early dry stage: Secondary | ICD-10-CM | POA: Diagnosis not present

## 2017-12-06 DIAGNOSIS — M545 Low back pain: Secondary | ICD-10-CM | POA: Diagnosis not present

## 2017-12-06 DIAGNOSIS — R29898 Other symptoms and signs involving the musculoskeletal system: Secondary | ICD-10-CM | POA: Diagnosis not present

## 2017-12-06 DIAGNOSIS — H04123 Dry eye syndrome of bilateral lacrimal glands: Secondary | ICD-10-CM | POA: Diagnosis not present

## 2017-12-06 DIAGNOSIS — Z961 Presence of intraocular lens: Secondary | ICD-10-CM | POA: Diagnosis not present

## 2017-12-07 DIAGNOSIS — R2681 Unsteadiness on feet: Secondary | ICD-10-CM | POA: Diagnosis not present

## 2017-12-07 DIAGNOSIS — M545 Low back pain: Secondary | ICD-10-CM | POA: Diagnosis not present

## 2017-12-07 DIAGNOSIS — R29898 Other symptoms and signs involving the musculoskeletal system: Secondary | ICD-10-CM | POA: Diagnosis not present

## 2017-12-08 DIAGNOSIS — M545 Low back pain: Secondary | ICD-10-CM | POA: Diagnosis not present

## 2017-12-08 DIAGNOSIS — R2681 Unsteadiness on feet: Secondary | ICD-10-CM | POA: Diagnosis not present

## 2017-12-08 DIAGNOSIS — R29898 Other symptoms and signs involving the musculoskeletal system: Secondary | ICD-10-CM | POA: Diagnosis not present

## 2017-12-11 DIAGNOSIS — R29898 Other symptoms and signs involving the musculoskeletal system: Secondary | ICD-10-CM | POA: Diagnosis not present

## 2017-12-11 DIAGNOSIS — M545 Low back pain: Secondary | ICD-10-CM | POA: Diagnosis not present

## 2017-12-11 DIAGNOSIS — R2681 Unsteadiness on feet: Secondary | ICD-10-CM | POA: Diagnosis not present

## 2017-12-13 DIAGNOSIS — R29898 Other symptoms and signs involving the musculoskeletal system: Secondary | ICD-10-CM | POA: Diagnosis not present

## 2017-12-13 DIAGNOSIS — M545 Low back pain: Secondary | ICD-10-CM | POA: Diagnosis not present

## 2017-12-13 DIAGNOSIS — R2681 Unsteadiness on feet: Secondary | ICD-10-CM | POA: Diagnosis not present

## 2017-12-14 DIAGNOSIS — R2681 Unsteadiness on feet: Secondary | ICD-10-CM | POA: Diagnosis not present

## 2017-12-14 DIAGNOSIS — M545 Low back pain: Secondary | ICD-10-CM | POA: Diagnosis not present

## 2017-12-14 DIAGNOSIS — M6281 Muscle weakness (generalized): Secondary | ICD-10-CM | POA: Diagnosis not present

## 2017-12-14 DIAGNOSIS — R29898 Other symptoms and signs involving the musculoskeletal system: Secondary | ICD-10-CM | POA: Diagnosis not present

## 2017-12-18 DIAGNOSIS — R29898 Other symptoms and signs involving the musculoskeletal system: Secondary | ICD-10-CM | POA: Diagnosis not present

## 2017-12-18 DIAGNOSIS — M6281 Muscle weakness (generalized): Secondary | ICD-10-CM | POA: Diagnosis not present

## 2017-12-18 DIAGNOSIS — R2681 Unsteadiness on feet: Secondary | ICD-10-CM | POA: Diagnosis not present

## 2017-12-18 DIAGNOSIS — M545 Low back pain: Secondary | ICD-10-CM | POA: Diagnosis not present

## 2017-12-19 DIAGNOSIS — M6281 Muscle weakness (generalized): Secondary | ICD-10-CM | POA: Diagnosis not present

## 2017-12-19 DIAGNOSIS — R29898 Other symptoms and signs involving the musculoskeletal system: Secondary | ICD-10-CM | POA: Diagnosis not present

## 2017-12-19 DIAGNOSIS — R2681 Unsteadiness on feet: Secondary | ICD-10-CM | POA: Diagnosis not present

## 2017-12-19 DIAGNOSIS — M545 Low back pain: Secondary | ICD-10-CM | POA: Diagnosis not present

## 2017-12-20 DIAGNOSIS — R2681 Unsteadiness on feet: Secondary | ICD-10-CM | POA: Diagnosis not present

## 2017-12-20 DIAGNOSIS — R29898 Other symptoms and signs involving the musculoskeletal system: Secondary | ICD-10-CM | POA: Diagnosis not present

## 2017-12-20 DIAGNOSIS — M545 Low back pain: Secondary | ICD-10-CM | POA: Diagnosis not present

## 2017-12-20 DIAGNOSIS — M6281 Muscle weakness (generalized): Secondary | ICD-10-CM | POA: Diagnosis not present

## 2017-12-21 DIAGNOSIS — R2681 Unsteadiness on feet: Secondary | ICD-10-CM | POA: Diagnosis not present

## 2017-12-21 DIAGNOSIS — M545 Low back pain: Secondary | ICD-10-CM | POA: Diagnosis not present

## 2017-12-21 DIAGNOSIS — M6281 Muscle weakness (generalized): Secondary | ICD-10-CM | POA: Diagnosis not present

## 2017-12-21 DIAGNOSIS — R29898 Other symptoms and signs involving the musculoskeletal system: Secondary | ICD-10-CM | POA: Diagnosis not present

## 2017-12-22 DIAGNOSIS — F411 Generalized anxiety disorder: Secondary | ICD-10-CM | POA: Diagnosis not present

## 2017-12-23 DIAGNOSIS — M545 Low back pain: Secondary | ICD-10-CM | POA: Diagnosis not present

## 2017-12-23 DIAGNOSIS — M6281 Muscle weakness (generalized): Secondary | ICD-10-CM | POA: Diagnosis not present

## 2017-12-23 DIAGNOSIS — R2681 Unsteadiness on feet: Secondary | ICD-10-CM | POA: Diagnosis not present

## 2017-12-23 DIAGNOSIS — R29898 Other symptoms and signs involving the musculoskeletal system: Secondary | ICD-10-CM | POA: Diagnosis not present

## 2017-12-27 ENCOUNTER — Encounter: Payer: Self-pay | Admitting: Family

## 2017-12-27 ENCOUNTER — Non-Acute Institutional Stay: Payer: Medicare Other | Admitting: Family

## 2017-12-27 ENCOUNTER — Encounter: Payer: Self-pay | Admitting: Internal Medicine

## 2017-12-27 DIAGNOSIS — I10 Essential (primary) hypertension: Secondary | ICD-10-CM | POA: Diagnosis not present

## 2017-12-27 DIAGNOSIS — K5901 Slow transit constipation: Secondary | ICD-10-CM | POA: Diagnosis not present

## 2017-12-27 DIAGNOSIS — E785 Hyperlipidemia, unspecified: Secondary | ICD-10-CM

## 2017-12-27 DIAGNOSIS — F411 Generalized anxiety disorder: Secondary | ICD-10-CM | POA: Diagnosis not present

## 2017-12-27 DIAGNOSIS — F339 Major depressive disorder, recurrent, unspecified: Secondary | ICD-10-CM | POA: Diagnosis not present

## 2017-12-27 NOTE — Progress Notes (Signed)
Location:  [facility] Room Number: 8 Place of Service:  ALF [phone]) Provider: Dinah Ngetich FNP-C   Blanchie Serve, MD  Patient Care Team: Blanchie Serve, MD as PCP - General (Internal Medicine) Melina Modena, [facility] Clent Jacks, MD as Consulting Physician (Ophthalmology) Milus Banister, MD as Consulting Physician (Gastroenterology) Norma Fredrickson, MD as Consulting Physician (Psychiatry) Jessy Oto, MD as Consulting Physician (Orthopedic Surgery) Bjorn Loser, MD as Consulting Physician (Urology)  Extended Emergency Contact Information Primary Emergency Contact: [address] [place], [place] [address] Johnnette Litter of [place] Phone: [phone] Mobile Phone: [phone] Relation: Son  Code Status:  Full code  Goals of care: Advanced Directive information Advanced Directives 12/27/2017  Does Patient Have a Medical Advance Directive? Yes  Type of Paramedic of [place];Out of facility DNR (pink MOST or yellow form);Living will  Does patient want to make changes to medical advance directive? -  Copy of [place] in Chart? Yes  Pre-existing out of facility DNR order (yellow form or pink MOST form) Pink MOST form placed in chart (order not valid for inpatient use)     Chief Complaint  Patient presents with  . Medical Management of Chronic Issues    HPI:  Pt is a 84 y.o. female seen today [facility] for medical management of chronic diseases.she has a medical history of HTN,Hyperlipidemia,Depression,GAD,GERD,DDD,Osteoporosis,OAB among other condition.she is seen in her room today with facility Nurse present at bedside.she states had bad anxiety yesterday requiring extra dose of antianxiety.she feels much better today has gone five hours without taking her antianxiety medication.she continues to work with therapy and swims twice a week which helps with her depression and anxiety.she follows  up with Psychiatry service.No recent fall episode.Her weight log reviewed 3.2 lbs weight loss over one month.her appetite has been stable.Her exercise,swimming and working with therapy could be contributing to her weight loss.       Past Medical History:  Diagnosis Date  . Abnormal mammogram, unspecified 04/19/2011  . Allergy   . Anxiety   . Backache, unspecified 01/21/2005  . Cervicalgia 08/20/2013  . Chronic kidney disease, stage II (mild) 04/03/2009  . Chronic rhinitis 09/28/2010  . DDD (degenerative disc disease)   . Depression   . Dermatophytosis of nail 01/21/2005  . Diverticulosis of colon (without mention of hemorrhage) 06/17/2005  . Edema 11/26/2004  . GERD (gastroesophageal reflux disease)   . Hiatal hernia   . High cholesterol   . Hyperglycemia 11/18/2014  . Hypertension   . Insomnia, unspecified 10/18/2011  . Left inguinal hernia 04/24/2014  . Nocturia 09/19/2008  . Osteoporosis, senile 11/18/2014  . Other abnormal blood chemistry 04/03/2007  . Pain in joint, pelvic region and thigh 11/12/2011  . Pain in joint, shoulder region 08/15/2006  . Pain in limb 08/26/2005  . Palpitations 09/29/2009  . PAOD (peripheral arterial occlusive disease) ([place]) 11/18/2014   Right leg; diminished popliteal, dorsalis pedis, and posterior tibial artery pulsations   . Postmenopausal atrophic vaginitis 12/14/2004  . Scoliosis   . Senile osteoporosis 12/14/2004  . Tension headache 10/04/2005  . Unspecified cataract 11/26/2004  . Unspecified constipation 01/21/2005  . Unspecified urinary incontinence 11/26/2004  . Vaginal stenosis 11/18/2014   Tight band about 1/2 inches into the vagina which will not allow passage of my index finger.    Past Surgical History:  Procedure Laterality Date  . ABDOMINAL HYSTERECTOMY  1969   Dr.Braun   .  bilateral eyelid surgery  2005   Dr.Scott  . BILATERAL OOPHORECTOMY  2002  . BREAST SURGERY     benign tumor removal, left breast 1969  . CATARACT EXTRACTION     Right  .  CHOLECYSTECTOMY  1990   Dr.Moore   . COLONOSCOPY  3/18/20008   inflammation, diverticular associated colitis -Dr. Ardis Hughs  . COSMETIC SURGERY    . ESOPHAGOGASTRODUODENOSCOPY N/A 01/24/2014   Procedure: ESOPHAGOGASTRODUODENOSCOPY (EGD);  Surgeon: Irene Shipper, MD;  Location: [facility] END[facility]COPY;  Service: Endoscopy;  Laterality: N/A;  . ESOPHAGOGASTRODUODENOSCOPY ENDOSCOPY  04/04/14   Dr. Ardis Hughs  . EYE SURGERY    . FOREIGN BODY REMOVAL N/A 01/24/2014   Procedure: FOREIGN BODY REMOVAL;  Surgeon: Irene Shipper, MD;  Location: [facility];  Service: Endoscopy;  Laterality: N/A;  . HIP PINNING,CANNULATED  11/07/2011   Procedure: CANNULATED HIP PINNING;  Surgeon: Jessy Oto, MD;  Location: [facility];  Service: Orthopedics;  Laterality: Left;  . ORIF FEMORAL NECK FRACTURE W/ [facility]  11/07/2011   Dr.Nitka   . TONSILLECTOMY     Dr.Joe Tamala Julian  . TUBAL LIGATION    . VAGINAL PROLAPSE REPAIR  2002   Dr.Horback     Allergies  Allergen Reactions  . Penicillins Other (See Comments)    CHILDHOOD REACTION  . Abilify [Aripiprazole] Rash    Allergies as of 12/27/2017      Reactions   Penicillins Other (See Comments)   CHILDHOOD REACTION   Abilify [aripiprazole] Rash      Medication List        Accurate as of 12/27/17  3:46 PM. Always use your most recent med list.          acetaminophen-codeine 300-30 MG tablet Commonly known as:  TYLENOL #3 Take 1 tablet by mouth every 6 (six) hours as needed for moderate pain.   antiseptic oral rinse Liqd 15 mLs by Mouth Rinse route every 4 (four) hours as needed for dry mouth.   aspirin 81 MG tablet Take 81 mg by mouth daily.   BIOFREEZE EX Apply 1 application topically daily as needed.   buPROPion 150 MG 24 hr tablet Commonly known as:  WELLBUTRIN XL Take 300 mg by mouth daily.   CALTRATE 600+D 600-800 MG-UNIT Tabs Generic drug:  Calcium Carb-Cholecalciferol Take 1 tablet by mouth 2 (two) times daily.   cholecalciferol 1000 units tablet Commonly  known as:  VITAMIN D Take 1,000 Units by mouth daily.   clonazePAM 0.5 MG tablet Commonly known as:  KLONOPIN Take 0.5 mg by mouth 3 (three) times daily.   docusate sodium 100 MG capsule Commonly known as:  COLACE Take 1 capsule (100 mg total) by mouth daily.   Glucosamine HCl 1000 MG Tabs Take 2,000 mg by mouth 2 (two) times daily.   ibandronate 150 MG tablet Commonly known as:  BONIVA Take 1 tablet (150 mg total) by mouth every 30 (thirty) days. Take in the morning with a full glass of water, on an empty stomach, and do not take anything else by mouth or lie down for the next 30 min.   meloxicam 7.5 MG tablet Commonly known as:  MOBIC TAKE 1 TABLET BY MOUTH EVERY DAY AS NEEDED FOR ARTHRITIS   mirtazapine 30 MG tablet Commonly known as:  REMERON Take 30 mg by mouth at bedtime.   multivitamin with minerals Tabs tablet Take 1 tablet by mouth daily.   NIFEdipine 60 MG 24 hr tablet Commonly known as:  PROCARDIA XL/ADALAT-CC TAKE 1  TABLET BY MOUTH EVERY DAY TO CONTROL BLOOD PRESSURE   OCUSOFT EYELID CLEANSING Pads Apply 1 application topically daily.   pantoprazole 40 MG tablet Commonly known as:  PROTONIX One daily to reduce stomach acid   polyethylene glycol packet Commonly known as:  MIRALAX / GLYCOLAX Take 17 g by mouth daily.   pravastatin 10 MG tablet Commonly known as:  PRAVACHOL Take 10 mg by mouth daily.   PRESERVISION AREDS 2+MULTI VIT PO Take 1 tablet by mouth 2 (two) times daily.   sertraline 50 MG tablet Commonly known as:  ZOLOFT Take 100 mg by mouth daily.   SYSTANE 0.4-0.3 % Soln Generic drug:  Polyethyl Glycol-Propyl Glycol Place 1 drop into both eyes 2 (two) times daily. Can also use as needed       Review of Systems  Constitutional: Negative for appetite change, chills, fatigue and fever.       3.2 lbs weight loss over one month   HENT: Negative for congestion, rhinorrhea, sinus pressure, sinus pain, sneezing, sore throat and trouble  swallowing.   Eyes: Positive for visual disturbance. Negative for discharge, redness and itching.       Wears eye glasses   Respiratory: Negative for cough, chest tightness, shortness of breath and wheezing.   Cardiovascular: Negative for chest pain, palpitations and leg swelling.  Gastrointestinal: Negative for abdominal distention, abdominal pain, constipation, diarrhea, nausea and vomiting.  Endocrine: Negative for cold intolerance, heat intolerance, polydipsia and polyphagia.  Genitourinary: Negative for dysuria, flank pain and urgency.  Musculoskeletal: Positive for arthralgias, back pain and gait problem.  Skin: Negative for color change, pallor, rash and wound.  Neurological: Negative for dizziness, light-headedness and headaches.  Hematological: Does not bruise/bleed easily.  Psychiatric/Behavioral: Negative for agitation, confusion and sleep disturbance. The patient is nervous/anxious.     Immunization History  Administered Date(s) Administered  . Influenza Whole 02/14/2012, 02/14/2013  . Influenza-Unspecified 02/27/2014, 02/12/2015, 02/25/2016, 03/15/2017  . Pneumococcal Conjugate-13 07/13/2015  . Pneumococcal Polysaccharide-23 05/16/1998  . Td 05/16/2004  . Zoster 05/16/2005   Pertinent  Health Maintenance Due  Topic Date Due  . MAMMOGRAM  12/20/2017  . INFLUENZA VACCINE  12/14/2017  . DEXA SCAN  Completed  . PNA vac Low Risk Adult  Completed   Fall Risk  04/21/2017 10/17/2016 10/14/2016 10/13/2016 05/31/2016  Falls in the past year? No No No No No   Functional Status Survey:    Vitals:   12/27/17 1156  BP: (!) 146/84  Pulse: 87  Resp: (!) 22  Temp: 99.1 F (37.3 C)  SpO2: 97%  Weight: 139 lb (63 kg)  Height: 5\' 2"  (1.575 m)   Body mass index is 25.42 kg/m. Physical Exam  Constitutional: She is oriented to person, place, and time. She appears well-developed and well-nourished. No distress.  Elderly   HENT:  Head: Normocephalic.  Right Ear: External ear  normal.  Left Ear: External ear normal.  Mouth/Throat: Oropharynx is clear and moist. No oropharyngeal exudate.  Eyes: Pupils are equal, round, and reactive to light. Conjunctivae and EOM are normal. Right eye exhibits no discharge. Left eye exhibits no discharge. No scleral icterus.  Neck: Normal range of motion. No JVD present. No thyromegaly present.  Cardiovascular: Intact distal pulses. Exam reveals no gallop and no friction rub.  Murmur heard. Pulmonary/Chest: Effort normal and breath sounds normal. No respiratory distress. She has no wheezes. She has no rales.  Abdominal: Soft. Bowel sounds are normal. She exhibits no distension and no mass. There is  no tenderness. There is no rebound and no guarding.  Musculoskeletal: She exhibits no edema or tenderness.  Moves x 4 extremities.unsteady gait ambulates with walker.  Lymphadenopathy:    She has no cervical adenopathy.  Neurological: She is oriented to person, place, and time. Gait abnormal.  Skin: Skin is warm and dry. No rash noted. No erythema. No pallor.  Psychiatric: Her speech is normal and behavior is normal. Judgment normal. Her mood appears anxious.  Nursing note and vitals reviewed.   Labs reviewed: Recent Labs    02/20/17 0750 07/10/17 08/04/17 0000  NA 140 143  143 141  K 4.3 4.2  4.2 4.3  CL 103 109 107  CO2 27 26 28   GLUCOSE 106*  --  106*  BUN 15 21 19   CREATININE 0.94* 0.9  0.89 0.91*  CALCIUM 10.0 9.3  9.3 9.7   Recent Labs    02/20/17 0750 07/10/17 08/04/17 0000  AST 19 18  18 19   ALT 13 13  13 13   ALKPHOS  --  60  60  --   BILITOT 0.4 0.2 0.4  PROT 6.9 5.8  5.8 6.5  ALBUMIN  --  3.5  3.5  --    Recent Labs    02/20/17 0750 07/10/17 08/04/17 0000  WBC 6.4 5.1  5.1 5.1  NEUTROABS 4,205  --   --   HGB 13.3 11.6*  11.6 12.8  HCT 40.1 34*  34 37.5  MCV 88.9  --  88.4  PLT 275 180 248   Lab Results  Component Value Date   TSH 1.84 08/04/2017   No results found for: HGBA1C Lab  Results  Component Value Date   CHOL 169 08/04/2017   HDL 48 (L) 08/04/2017   LDLCALC 99 08/04/2017   TRIG 121 08/04/2017   CHOLHDL 3.5 08/04/2017    Significant Diagnostic Results in last 30 days:  No results found.  Assessment/Plan 1. Essential hypertension B/p reviewed overall stable.continue on nifedipine  60 mg tablet daily.Has upcoming CMP ordered by MD.  2. Hyperlipidemia LDL goal <100 Continue on Pravastatin 10 mg tablet daily.Lipid panel ordered by MD   3. GAD (generalized anxiety disorder) Feeling better today.continue on Zoloft 100 mg tablet daily and clonazepam 0.5 mg tablet three times daily.continue to follow up with Psychiatry service    4. Slow transit constipation Current regimen effective.continue to encourage oral intake and hydration.  5. Major depression, recurrent, chronic Mood stable.continue on bupropion 300 mg tablet daily,Remeron 7.5 mg tablet daily,Zoloft 100 mg tablet daily and clonazepam 0.5 mg tablet three times daily.continue to follow up with Psychiatry service.monitor for mood changes.Encouraged to continue with swimming twice per week and exercises at the facility [employer]  Family/ staff Communication: Reviewed plan of care with patient and facility charge Nurse   Labs/tests ordered: None   Sandrea Hughs, NP

## 2017-12-28 DIAGNOSIS — M545 Low back pain: Secondary | ICD-10-CM | POA: Diagnosis not present

## 2017-12-28 DIAGNOSIS — F411 Generalized anxiety disorder: Secondary | ICD-10-CM | POA: Diagnosis not present

## 2017-12-28 DIAGNOSIS — R2681 Unsteadiness on feet: Secondary | ICD-10-CM | POA: Diagnosis not present

## 2017-12-28 DIAGNOSIS — R29898 Other symptoms and signs involving the musculoskeletal system: Secondary | ICD-10-CM | POA: Diagnosis not present

## 2017-12-28 DIAGNOSIS — M6281 Muscle weakness (generalized): Secondary | ICD-10-CM | POA: Diagnosis not present

## 2018-01-02 DIAGNOSIS — M545 Low back pain: Secondary | ICD-10-CM | POA: Diagnosis not present

## 2018-01-02 DIAGNOSIS — M6281 Muscle weakness (generalized): Secondary | ICD-10-CM | POA: Diagnosis not present

## 2018-01-02 DIAGNOSIS — R29898 Other symptoms and signs involving the musculoskeletal system: Secondary | ICD-10-CM | POA: Diagnosis not present

## 2018-01-02 DIAGNOSIS — R2681 Unsteadiness on feet: Secondary | ICD-10-CM | POA: Diagnosis not present

## 2018-01-03 DIAGNOSIS — F411 Generalized anxiety disorder: Secondary | ICD-10-CM | POA: Diagnosis not present

## 2018-01-04 DIAGNOSIS — R2681 Unsteadiness on feet: Secondary | ICD-10-CM | POA: Diagnosis not present

## 2018-01-04 DIAGNOSIS — M545 Low back pain: Secondary | ICD-10-CM | POA: Diagnosis not present

## 2018-01-04 DIAGNOSIS — R29898 Other symptoms and signs involving the musculoskeletal system: Secondary | ICD-10-CM | POA: Diagnosis not present

## 2018-01-04 DIAGNOSIS — M6281 Muscle weakness (generalized): Secondary | ICD-10-CM | POA: Diagnosis not present

## 2018-01-08 DIAGNOSIS — M545 Low back pain: Secondary | ICD-10-CM | POA: Diagnosis not present

## 2018-01-08 DIAGNOSIS — M6281 Muscle weakness (generalized): Secondary | ICD-10-CM | POA: Diagnosis not present

## 2018-01-08 DIAGNOSIS — R29898 Other symptoms and signs involving the musculoskeletal system: Secondary | ICD-10-CM | POA: Diagnosis not present

## 2018-01-08 DIAGNOSIS — R2681 Unsteadiness on feet: Secondary | ICD-10-CM | POA: Diagnosis not present

## 2018-01-09 DIAGNOSIS — M545 Low back pain: Secondary | ICD-10-CM | POA: Diagnosis not present

## 2018-01-09 DIAGNOSIS — M6281 Muscle weakness (generalized): Secondary | ICD-10-CM | POA: Diagnosis not present

## 2018-01-09 DIAGNOSIS — R2681 Unsteadiness on feet: Secondary | ICD-10-CM | POA: Diagnosis not present

## 2018-01-09 DIAGNOSIS — R29898 Other symptoms and signs involving the musculoskeletal system: Secondary | ICD-10-CM | POA: Diagnosis not present

## 2018-01-09 DIAGNOSIS — F411 Generalized anxiety disorder: Secondary | ICD-10-CM | POA: Diagnosis not present

## 2018-01-09 DIAGNOSIS — F331 Major depressive disorder, recurrent, moderate: Secondary | ICD-10-CM | POA: Diagnosis not present

## 2018-01-10 DIAGNOSIS — M6281 Muscle weakness (generalized): Secondary | ICD-10-CM | POA: Diagnosis not present

## 2018-01-10 DIAGNOSIS — M545 Low back pain: Secondary | ICD-10-CM | POA: Diagnosis not present

## 2018-01-10 DIAGNOSIS — R29898 Other symptoms and signs involving the musculoskeletal system: Secondary | ICD-10-CM | POA: Diagnosis not present

## 2018-01-10 DIAGNOSIS — F411 Generalized anxiety disorder: Secondary | ICD-10-CM | POA: Diagnosis not present

## 2018-01-10 DIAGNOSIS — R2681 Unsteadiness on feet: Secondary | ICD-10-CM | POA: Diagnosis not present

## 2018-01-11 ENCOUNTER — Other Ambulatory Visit: Payer: Self-pay | Admitting: Ophthalmology

## 2018-01-11 DIAGNOSIS — D23121 Other benign neoplasm of skin of left upper eyelid, including canthus: Secondary | ICD-10-CM | POA: Diagnosis not present

## 2018-01-15 DIAGNOSIS — R2681 Unsteadiness on feet: Secondary | ICD-10-CM | POA: Diagnosis not present

## 2018-01-15 DIAGNOSIS — M6281 Muscle weakness (generalized): Secondary | ICD-10-CM | POA: Diagnosis not present

## 2018-01-16 DIAGNOSIS — M6281 Muscle weakness (generalized): Secondary | ICD-10-CM | POA: Diagnosis not present

## 2018-01-16 DIAGNOSIS — R2681 Unsteadiness on feet: Secondary | ICD-10-CM | POA: Diagnosis not present

## 2018-01-18 DIAGNOSIS — R739 Hyperglycemia, unspecified: Secondary | ICD-10-CM

## 2018-01-18 DIAGNOSIS — I10 Essential (primary) hypertension: Secondary | ICD-10-CM | POA: Diagnosis not present

## 2018-01-18 DIAGNOSIS — R2681 Unsteadiness on feet: Secondary | ICD-10-CM | POA: Diagnosis not present

## 2018-01-18 DIAGNOSIS — M6281 Muscle weakness (generalized): Secondary | ICD-10-CM | POA: Diagnosis not present

## 2018-01-19 LAB — COMPLETE METABOLIC PANEL WITH GFR
AG Ratio: 1.6 (calc) (ref 1.0–2.5)
ALT: 13 U/L (ref 6–29)
AST: 19 U/L (ref 10–35)
Albumin: 4 g/dL (ref 3.6–5.1)
Alkaline phosphatase (APISO): 60 U/L (ref 33–130)
BUN / CREAT RATIO: 23 (calc) — AB (ref 6–22)
BUN: 21 mg/dL (ref 7–25)
CO2: 28 mmol/L (ref 20–32)
CREATININE: 0.93 mg/dL — AB (ref 0.60–0.88)
Calcium: 9.7 mg/dL (ref 8.6–10.4)
Chloride: 103 mmol/L (ref 98–110)
GFR, Est African American: 63 mL/min/{1.73_m2} (ref >=60)
GFR, Est Non African American: 54 mL/min/{1.73_m2} — ABNORMAL LOW (ref >=60)
GLOBULIN: 2.5 g/dL (ref 1.9–3.7)
Glucose, Bld: 86 mg/dL (ref 65–99)
Potassium: 4.2 mmol/L (ref 3.5–5.3)
SODIUM: 139 mmol/L (ref 135–146)
Total Bilirubin: 0.3 mg/dL (ref 0.2–1.2)
Total Protein: 6.5 g/dL (ref 6.1–8.1)

## 2018-01-19 LAB — HEMOGLOBIN A1C
HEMOGLOBIN A1C: 5.7 %{Hb} — AB (ref <5.7)
Mean Plasma Glucose: 117 (calc)
eAG (mmol/L): 6.5 (calc)

## 2018-01-22 DIAGNOSIS — R2681 Unsteadiness on feet: Secondary | ICD-10-CM | POA: Diagnosis not present

## 2018-01-22 DIAGNOSIS — M6281 Muscle weakness (generalized): Secondary | ICD-10-CM | POA: Diagnosis not present

## 2018-01-23 DIAGNOSIS — R2681 Unsteadiness on feet: Secondary | ICD-10-CM | POA: Diagnosis not present

## 2018-01-23 DIAGNOSIS — H04332 Acute lacrimal canaliculitis of left lacrimal passage: Secondary | ICD-10-CM | POA: Diagnosis not present

## 2018-01-23 DIAGNOSIS — H10022 Other mucopurulent conjunctivitis, left eye: Secondary | ICD-10-CM | POA: Diagnosis not present

## 2018-01-23 DIAGNOSIS — M6281 Muscle weakness (generalized): Secondary | ICD-10-CM | POA: Diagnosis not present

## 2018-01-24 ENCOUNTER — Encounter: Payer: Medicare Other | Admitting: Internal Medicine

## 2018-01-24 ENCOUNTER — Encounter: Payer: Self-pay | Admitting: Internal Medicine

## 2018-01-24 NOTE — Progress Notes (Signed)
Opened in error

## 2018-01-25 DIAGNOSIS — R2681 Unsteadiness on feet: Secondary | ICD-10-CM | POA: Diagnosis not present

## 2018-01-25 DIAGNOSIS — M6281 Muscle weakness (generalized): Secondary | ICD-10-CM | POA: Diagnosis not present

## 2018-01-26 DIAGNOSIS — F411 Generalized anxiety disorder: Secondary | ICD-10-CM | POA: Diagnosis not present

## 2018-01-30 DIAGNOSIS — M6281 Muscle weakness (generalized): Secondary | ICD-10-CM | POA: Diagnosis not present

## 2018-01-30 DIAGNOSIS — R2681 Unsteadiness on feet: Secondary | ICD-10-CM | POA: Diagnosis not present

## 2018-02-01 DIAGNOSIS — M6281 Muscle weakness (generalized): Secondary | ICD-10-CM | POA: Diagnosis not present

## 2018-02-01 DIAGNOSIS — R2681 Unsteadiness on feet: Secondary | ICD-10-CM | POA: Diagnosis not present

## 2018-02-05 DIAGNOSIS — M6281 Muscle weakness (generalized): Secondary | ICD-10-CM | POA: Diagnosis not present

## 2018-02-05 DIAGNOSIS — R2681 Unsteadiness on feet: Secondary | ICD-10-CM | POA: Diagnosis not present

## 2018-02-07 DIAGNOSIS — M6281 Muscle weakness (generalized): Secondary | ICD-10-CM | POA: Diagnosis not present

## 2018-02-07 DIAGNOSIS — R2681 Unsteadiness on feet: Secondary | ICD-10-CM | POA: Diagnosis not present

## 2018-02-12 DIAGNOSIS — M6281 Muscle weakness (generalized): Secondary | ICD-10-CM | POA: Diagnosis not present

## 2018-02-12 DIAGNOSIS — R2681 Unsteadiness on feet: Secondary | ICD-10-CM | POA: Diagnosis not present

## 2018-03-06 DIAGNOSIS — F411 Generalized anxiety disorder: Secondary | ICD-10-CM | POA: Diagnosis not present

## 2018-03-06 DIAGNOSIS — F331 Major depressive disorder, recurrent, moderate: Secondary | ICD-10-CM | POA: Diagnosis not present

## 2018-03-15 DIAGNOSIS — F411 Generalized anxiety disorder: Secondary | ICD-10-CM | POA: Diagnosis not present

## 2018-03-23 DIAGNOSIS — F411 Generalized anxiety disorder: Secondary | ICD-10-CM | POA: Diagnosis not present

## 2018-03-28 ENCOUNTER — Encounter: Payer: Self-pay | Admitting: Family Medicine

## 2018-03-28 DIAGNOSIS — F411 Generalized anxiety disorder: Secondary | ICD-10-CM | POA: Diagnosis not present

## 2018-03-28 NOTE — Progress Notes (Signed)
Opened in error; Disregard.

## 2018-03-29 ENCOUNTER — Non-Acute Institutional Stay: Payer: Medicare Other | Admitting: Family Medicine

## 2018-03-29 ENCOUNTER — Encounter: Payer: Self-pay | Admitting: Family Medicine

## 2018-03-29 DIAGNOSIS — I10 Essential (primary) hypertension: Secondary | ICD-10-CM | POA: Diagnosis not present

## 2018-03-29 DIAGNOSIS — R609 Edema, unspecified: Secondary | ICD-10-CM

## 2018-03-29 DIAGNOSIS — F339 Major depressive disorder, recurrent, unspecified: Secondary | ICD-10-CM | POA: Diagnosis not present

## 2018-03-29 DIAGNOSIS — E785 Hyperlipidemia, unspecified: Secondary | ICD-10-CM | POA: Diagnosis not present

## 2018-03-29 DIAGNOSIS — N3281 Overactive bladder: Secondary | ICD-10-CM | POA: Diagnosis not present

## 2018-03-29 NOTE — Progress Notes (Signed)
Provider:  Alain Honey, MD Location:  [facility] Room Number: Karolee Stamps Place of Service:  ALF ([id number])  PCP: Wardell Honour, MD Patient Care Team: Wardell Honour, MD as PCP - General (Family Medicine) [place] Clent Jacks, MD as Consulting Physician (Ophthalmology) Milus Banister, MD as Consulting Physician (Gastroenterology) Norma Fredrickson, MD as Consulting Physician (Psychiatry) Jessy Oto, MD as Consulting Physician (Orthopedic Surgery) Bjorn Loser, MD as Consulting Physician (Urology)  Extended Emergency Contact Information Primary Emergency Contact: [address] [place], [place] [address] Johnnette Litter of [place] Phone: [phone] Mobile Phone: [phone] Relation: Son  Code Status: FULL Goals of Care: Advanced Directive information Advanced Directives 03/29/2018  Does Patient Have a Medical Advance Directive? Yes  Type of Paramedic of [place];Living will;Out of facility DNR (pink MOST or yellow form)  Does patient want to make changes to medical advance directive? No - Patient declined  Copy of [place] in Chart? Yes - validated most recent copy scanned in chart (See row information)  Pre-existing out of facility DNR order (yellow form or pink MOST form) Pink MOST form placed in chart (order not valid for inpatient use)      Chief Complaint  Patient presents with  . Medical Management of Chronic Issues    Routine Visit    HPI: Patient is a 84 y.o. female seen today for medical management of chronic problems including: Major depressive disorder, overactive bladder, and low back pain I have known Ms. Tina Hampton for some years as she has been Visual merchandiser patients.  Her husband has since died and she is living in assisted living and been in independent living at friend's home [place].  We spent most of our time today talking about her depression.  She sees psychiatry and  counselor and was gone for much of the day yesterday when I attempted to see her.  Her medicines have been juggled somewhat.  Currently she is on Zoloft 150 mg, down from 200 mg and Wellbutrin 300 mg daily.  She helped so takes Klonopin 0.25 mg 4 times daily as needed.  In addition she takes Remeron 30 mg at bedtime and reportedly sleeps well. She does not have specific other complaints.  We talked on her overactive bladder problem.  She has been tried on most medicines for OAB and is now wearing depends at night and daytime. Her back pain is managed with meloxicam.  Past Medical History:  Diagnosis Date  . Abnormal mammogram, unspecified 04/19/2011  . Allergy   . Anxiety   . Backache, unspecified 01/21/2005  . Cervicalgia 08/20/2013  . Chronic kidney disease, stage II (mild) 04/03/2009  . Chronic rhinitis 09/28/2010  . DDD (degenerative disc disease)   . Depression   . Dermatophytosis of nail 01/21/2005  . Diverticulosis of colon (without mention of hemorrhage) 06/17/2005  . Edema 11/26/2004  . GERD (gastroesophageal reflux disease)   . Hiatal hernia   . High cholesterol   . Hyperglycemia 11/18/2014  . Hypertension   . Insomnia, unspecified 10/18/2011  . Left inguinal hernia 04/24/2014  . Nocturia 09/19/2008  . Osteoporosis, senile 11/18/2014  . Other abnormal blood chemistry 04/03/2007  . Pain in joint, pelvic region and thigh 11/12/2011  . Pain in joint, shoulder region 08/15/2006  . Pain in limb 08/26/2005  . Palpitations 09/29/2009  . PAOD (peripheral arterial occlusive disease) ([place]) 11/18/2014   Right leg; diminished popliteal, dorsalis  pedis, and posterior tibial artery pulsations   . Postmenopausal atrophic vaginitis 12/14/2004  . Scoliosis   . Senile osteoporosis 12/14/2004  . Tension headache 10/04/2005  . Unspecified cataract 11/26/2004  . Unspecified constipation 01/21/2005  . Unspecified urinary incontinence 11/26/2004  . Vaginal stenosis 11/18/2014   Tight band about 1/2 inches into the vagina  which will not allow passage of my index finger.    Past Surgical History:  Procedure Laterality Date  . ABDOMINAL HYSTERECTOMY  1969   Dr.Braun   . bilateral eyelid surgery  2005   Dr.Scott  . BILATERAL OOPHORECTOMY  2002  . BREAST SURGERY     benign tumor removal, left breast 1969  . CATARACT EXTRACTION     Right  . CHOLECYSTECTOMY  1990   Dr.Moore   . COLONOSCOPY  3/18/20008   inflammation, diverticular associated colitis -Dr. Ardis Hughs  . COSMETIC SURGERY    . ESOPHAGOGASTRODUODENOSCOPY N/A 01/24/2014   Procedure: ESOPHAGOGASTRODUODENOSCOPY (EGD);  Surgeon: Irene Shipper, MD;  Location: [facility] ENDOSCOPY;  Service: Endoscopy;  Laterality: N/A;  . ESOPHAGOGASTRODUODENOSCOPY ENDOSCOPY  04/04/14   Dr. Ardis Hughs  . EYE SURGERY    . FOREIGN BODY REMOVAL N/A 01/24/2014   Procedure: FOREIGN BODY REMOVAL;  Surgeon: Irene Shipper, MD;  Location: [facility];  Service: Endoscopy;  Laterality: N/A;  . HIP PINNING,CANNULATED  11/07/2011   Procedure: CANNULATED HIP PINNING;  Surgeon: Jessy Oto, MD;  Location: [facility] ORS;  Service: Orthopedics;  Laterality: Left;  . ORIF FEMORAL NECK FRACTURE W/ [facility]  11/07/2011   Dr.Nitka   . TONSILLECTOMY     Dr.Joe Tamala Julian  . TUBAL LIGATION    . VAGINAL PROLAPSE REPAIR  2002   Dr.Horback     reports that she has never smoked. She has never used smokeless tobacco. She reports that she drinks alcohol. She reports that she does not use drugs. Social History   Socioeconomic History  . Marital status: Widowed    Spouse name: Not on file  . Number of children: Not on file  . Years of education: Not on file  . Highest education level: Not on file  Occupational History  . 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 Financial resource strain: Not hard at all  . Food insecurity:    Worry: Never true    Inability: Never true  . Transportation needs:    Medical: No    Non-medical: No  Tobacco Use  . Smoking status: Never Smoker  .  Smokeless tobacco: Never Used  Substance and Sexual Activity  . Alcohol use: Yes    Alcohol/week: 0.0 standard drinks    Comment: one glass of wine in evening  . Drug use: No  . Sexual activity: Never  Lifestyle  . Physical activity:    Days per week: 0 days    Minutes per session: 0 min  . Stress: Very much  Relationships  . Social connections:    Talks on phone: More than three times a week    Gets together: More than three times a week    Attends religious service: 1 to 4 times per year    Active member of club or organization: Yes    Attends meetings of clubs or organizations: Never    Relationship status: Widowed  . Intimate partner violence:    Fear of current or ex partner: No    Emotionally abused: No    Physically abused: No    Forced  sexual activity: No  Other Topics Concern  . Not on file  Social History Narrative   Lives at [facility] since 04/14/2005   Widowed (husband expired 2014)   Has Living Will, POA, MOST   Exercise: water aerobic  5 times a week   Walks with walker   Never smoked   Drinks moderate amount of caffeinate beverages daily   Alcohol none       Functional Status Survey:    Family History  Problem Relation Age of Onset  . Heart disease Mother   . Stroke Mother   . Emphysema Father   . Arthritis Father   . Alcohol abuse Brother   . Heart disease Son   . Leukemia Son   . Esophageal cancer Neg Hx   . Stomach cancer Neg Hx   . Colon cancer Neg Hx     Health Maintenance  Topic Date Due  . MAMMOGRAM  12/20/2017  . TETANUS/TDAP  05/16/2018 (Originally 05/16/2014)  . INFLUENZA VACCINE  Completed  . DEXA SCAN  Completed  . PNA vac Low Risk Adult  Completed    Allergies  Allergen Reactions  . Penicillins Other (See Comments)    CHILDHOOD REACTION  . Abilify [Aripiprazole] Rash    Outpatient Encounter Medications as of 03/29/2018  Medication Sig  . acetaminophen-codeine (TYLENOL #3) 300-30 MG tablet Take 1 tablet by  mouth every 6 (six) hours as needed for moderate pain.  Marland Kitchen antiseptic oral rinse (BIOTENE) LIQD 15 mLs by Mouth Rinse route every 4 (four) hours as needed for dry mouth.   Marland Kitchen aspirin 81 MG chewable tablet Chew 81 mg by mouth daily.  Marland Kitchen buPROPion (WELLBUTRIN XL) 150 MG 24 hr tablet Take 300 mg by mouth daily.   . Calcium Carb-Cholecalciferol (CALTRATE 600+D) 600-800 MG-UNIT TABS Take 1 tablet by mouth 2 (two) times daily.  . chlorpheniramine-HYDROcodone (TUSSIONEX PENNKINETIC ER) 10-8 MG/5ML SUER Take 5 mLs by mouth every 12 (twelve) hours as needed for cough.  . cholecalciferol (VITAMIN D) 1000 UNITS tablet Take 1,000 Units by mouth daily.  . clonazePAM (KLONOPIN) 0.5 MG tablet Take 0.25 mg by mouth 4 (four) times daily.   Marland Kitchen docusate sodium (COLACE) 100 MG capsule Take 1 capsule (100 mg total) by mouth daily.  . Eyelid Cleansers (OCUSOFT EYELID CLEANSING) PADS Apply 1 application topically daily.  . Glucosamine HCl 1000 MG TABS Take 2,000 mg by mouth 2 (two) times daily. 2 tabs  . ibandronate (BONIVA) 150 MG tablet Take 1 tablet (150 mg total) by mouth every 30 (thirty) days. Take in the morning with a full glass of water, on an empty stomach, and do not take anything else by mouth or lie down for the next 30 min.  . meloxicam (MOBIC) 7.5 MG tablet TAKE 1 TABLET BY MOUTH EVERY DAY AS NEEDED FOR ARTHRITIS  . Menthol, Topical Analgesic, (BIOFREEZE EX) Apply 1 application topically daily as needed.   . mirtazapine (REMERON) 30 MG tablet Take 30 mg by mouth at bedtime.  . Multiple Vitamin (MULTIVITAMIN WITH MINERALS) TABS Take 1 tablet by mouth daily.  . Multiple Vitamins-Minerals (PRESERVISION AREDS 2+MULTI VIT PO) Take 1 tablet by mouth 2 (two) times daily.  Marland Kitchen NIFEdipine (PROCARDIA XL/ADALAT-CC) 60 MG 24 hr tablet TAKE 1 TABLET BY MOUTH EVERY DAY TO CONTROL BLOOD PRESSURE  . pantoprazole (PROTONIX) 40 MG tablet One daily to reduce stomach acid  . Polyethyl Glycol-Propyl Glycol (SYSTANE) 0.4-0.3 % SOLN  Place 1 drop into both eyes 2 (  two) times daily. Can also apply  one drop to both eyes BID-PRN  . polyethylene glycol (MIRALAX / GLYCOLAX) packet Take 17 g by mouth daily.   . pravastatin (PRAVACHOL) 10 MG tablet Take 10 mg by mouth daily.  . sertraline (ZOLOFT) 100 MG tablet Take 150 mg by mouth daily. 1 and 1/2 tabs   No facility-administered encounter medications on file as of 03/29/2018.     Review of Systems  Constitutional: Negative.   HENT: Negative.   Respiratory: Negative.   Cardiovascular: Negative.   Gastrointestinal: Negative.   Genitourinary: Positive for frequency.  Musculoskeletal: Positive for gait problem.  Psychiatric/Behavioral: The patient is nervous/anxious.     Vitals:   03/29/18 0926  BP: (!) 144/82  Pulse: 88  Resp: 20  Temp: 98.3 F (36.8 C)  TempSrc: Oral  SpO2: 96%  Weight: 144 lb 6.4 oz (65.5 kg)  Height: 5\' 2"  (1.575 m)   Body mass index is 26.41 kg/m. Physical Exam  Constitutional: She is oriented to person, place, and time. She appears well-developed and well-nourished.  Pleasant but seems sad about her problems with depression  HENT:  Head: Normocephalic.  Mouth/Throat: Oropharynx is clear and moist.  Eyes: Pupils are equal, round, and reactive to light.  Cardiovascular: Normal rate and regular rhythm.  Murmur heard. Pulmonary/Chest: Effort normal and breath sounds normal.  Abdominal: Soft.  Musculoskeletal: Normal range of motion. She exhibits no edema.  Ambulates with a rolling walker.  Tries to go to stretching and exercise classes when she feels like it  Neurological: She is alert and oriented to person, place, and time.  Psychiatric: She has a normal mood and affect. Her behavior is normal. Judgment and thought content normal.  Nursing note and vitals reviewed.   Labs reviewed: Basic Metabolic Panel: Recent Labs    07/10/17 08/04/17 0000 01/18/18 0831  NA 143  143 141 139  K 4.2  4.2 4.3 4.2  CL 109 107 103  CO2 26 28  28   GLUCOSE  --  106* 86  BUN 21 19 21   CREATININE 0.9  0.89 0.91* 0.93*  CALCIUM 9.3  9.3 9.7 9.7   Liver Function Tests: Recent Labs    07/10/17 08/04/17 0000 01/18/18 0831  AST 18  18 19 19   ALT 13  13 13 13   ALKPHOS 60  60  --   --   BILITOT 0.2 0.4 0.3  PROT 5.8  5.8 6.5 6.5  ALBUMIN 3.5  3.5  --   --    No results for input(s): LIPASE, AMYLASE in the last 8760 hours. No results for input(s): AMMONIA in the last 8760 hours. CBC: Recent Labs    07/10/17 08/04/17 0000  WBC 5.1  5.1 5.1  HGB 11.6*  11.6 12.8  HCT 34*  34 37.5  MCV  --  88.4  PLT 180 248   Cardiac Enzymes: No results for input(s): CKTOTAL, CKMB, CKMBINDEX, TROPONINI in the last 8760 hours. BNP: Invalid input(s): POCBNP Lab Results  Component Value Date   HGBA1C 5.7 (H) 01/18/2018   Lab Results  Component Value Date   TSH 1.84 08/04/2017   No results found for: VITAMINB12 No results found for: FOLATE No results found for: IRON, TIBC, FERRITIN  Imaging and Procedures obtained prior to SNF admission: Mr Lumbar Spine W/o Contrast  Result Date: 04/11/2017 CLINICAL DATA:  Initial evaluation for chronic low back pain extending into the buttocks and right upper length. EXAM: MRI LUMBAR SPINE WITHOUT CONTRAST TECHNIQUE:  Multiplanar, multisequence MR imaging of the lumbar spine was performed. No intravenous contrast was administered. COMPARISON:  Prior radiograph from 03/23/2017. FINDINGS: Segmentation: Normal segmentation. Lowest well-formed disc labeled the L5-S1 level. Alignment: Severe dextroscoliosis with apex at L2-3, stable from previous. Trace anterolisthesis of L3 on L4 and L4 on L5. Vertebrae: Vertebral body heights are maintained without evidence for acute or chronic fracture. There is abnormal marrow edema extending through the left sacral ala, consistent with acute insufficiency type fracture (series 8, image 19). Additionally, there is abnormal marrow edema with subtle linear lucency  traversing the right iliac wing just lateral to the right SI joint, also consistent with acute insufficiency type fracture (series 100, image 43) marrow edema within the S2 and S3 segments also consistent with insufficiency fractures (series 8, image 12). Signal intensity within the underlying bone marrow is somewhat heterogeneous but within normal limits. Chronic reactive endplate changes seen at the left aspect of the L1-2 interspace and right aspect of the L5-S1 interspaces due to scoliotic curvature. No other worrisome osseous lesions. Conus medullaris and cauda equina: Conus extends to the L1-2 level. Conus and cauda equina appear normal. Paraspinal and other soft tissues: Soft tissue edema seen surrounding the above described insufficiency type fractures. Chronic atrophy noted within the lower paraspinous musculature. Asymmetric atrophy also noted within the left psoas muscle. Small T2 hyperintense simple cysts noted within left kidney. Visualized visceral structures otherwise unremarkable. Intra-abdominal aorta is tortuous. Disc levels: T11-12: Minimal disc bulge. Right far lateral reactive endplate changes. No stenosis. T12-L1: Chronic intervertebral disc space narrowing with diffuse disc bulge and disc desiccation. Chronic reactive endplate changes with marginal endplate osteophytic spurring. Posterior disc osteophyte indents and partially effaces the ventral thecal sac. Left-sided facet hypertrophy. Resultant mild canal with left subarticular stenosis. Foramina are grossly patent. L1-2: Chronic intervertebral disc space narrowing with diffuse disc bulge and disc desiccation. Left far lateral/ extraforaminal reactive endplate changes. Moderate to advanced left-sided facet arthrosis. No significant canal narrowing. Mild to moderate left lateral recess narrowing/effacement. Mild left foraminal stenosis without impingement. Right neural foramen remains patent. L2-3: Chronic intervertebral disc space narrowing  with diffuse disc bulge and disc desiccation. Endplate marginal osteophytic spurring, greater on the left. Moderate left-sided facet hypertrophy. Mild left lateral recess narrowing without significant canal stenosis. No significant foraminal encroachment. L3-4: Trace anterolisthesis. Chronic intervertebral disc space narrowing with disc desiccation. Severe left with moderate right facet arthrosis. Moderate left subarticular stenosis without significant canal narrowing. Mild to moderate left L3 foraminal stenosis. L4-5: Trace anterolisthesis. Diffuse disc bulge with disc desiccation. Severe bilateral facet arthrosis, slightly worse on the right. Mild canal with moderate bilateral subarticular stenosis, worse on the right. No significant foraminal encroachment. L5-S1: Disc bulge with disc desiccation and intervertebral disc space narrowing. Right far lateral chronic reactive endplate changes. Severe right with moderate left facet arthrosis. Superimposed 6 mm synovial cyst at the anterior margin of the right L5-S1 facet (series 6, image 9). Reactive effusion present within the left L5-S1 facet. Mild to moderate right L5 foraminal stenosis related disc osteophyte and facet disease. No significant left foraminal encroachment. Central canal and lateral recesses are widely patent. IMPRESSION: 1. Acute insufficiency type fractures involving the left sacral ala, right iliac wing, as well as the S2 and S3 segments. 2. Severe dextroscoliosis with apex at L2-3. Associated chronic multilevel degenerative spondylolysis and facet arthrosis as above. Resultant mild-to-moderate lateral recess narrowing at L1-2 thru L4-5 as above, overall worse on the left. No significant central canal stenosis or  evidence for neural impingement. 3. Moderate right L5 foraminal stenosis related to disc osteophyte and facet disease. Electronically Signed   By: Jeannine Boga M.D.   On: 04/11/2017 13:56    Assessment/Plan 1. Essential  hypertension BP today is 144/82.  When last checked 18/66 patient takes nifedipine.  Continue same  2. OAB (overactive bladder) On no medications.  Problem is remedied only by local measures, adult diapers  3. Hyperlipidemia LDL goal <100 LDL cholesterol was 99 8 months ago on low-dose simvastatin.  Given her other medical problems I have asked her to hold simvastatin for 1 month and reassess for any improvement of other symptoms  4. Edema, unspecified type No edema noted.  Question problem resolved  5. Major depression, recurrent, chronic ([place]) Managed by mental health professionals.  Combination of sertraline bupropion and mirtazapine as well as clonazepam for anxiety   Family/ staff Communication: Findings communicated to patient  Labs/tests ordered: none  Lillette Boxer. Sabra Heck, [place] [address] [place], [place] Office [phone]

## 2018-04-02 DIAGNOSIS — F411 Generalized anxiety disorder: Secondary | ICD-10-CM | POA: Diagnosis not present

## 2018-04-02 DIAGNOSIS — F331 Major depressive disorder, recurrent, moderate: Secondary | ICD-10-CM | POA: Diagnosis not present

## 2018-04-05 ENCOUNTER — Other Ambulatory Visit: Payer: Self-pay

## 2018-04-05 DIAGNOSIS — F411 Generalized anxiety disorder: Secondary | ICD-10-CM | POA: Diagnosis not present

## 2018-04-19 DIAGNOSIS — F411 Generalized anxiety disorder: Secondary | ICD-10-CM | POA: Diagnosis not present

## 2018-04-25 ENCOUNTER — Non-Acute Institutional Stay: Payer: Medicare Other

## 2018-04-25 DIAGNOSIS — Z0000 Encounter for general adult medical examination without abnormal findings: Secondary | ICD-10-CM

## 2018-04-25 NOTE — Progress Notes (Addendum)
Subjective:   Tina Hampton is a 84 y.o. female who presents for Medicare Annual (Subsequent) preventive examination at [facility]-04/18/2017    Objective:     Vitals: BP 136/80 (BP Location: Right Arm, Patient Position: Sitting)   Pulse 85   Temp 98 F (36.7 C) (Oral)   Ht 5\' 2"  (1.575 m)   Wt 144 lb (65.3 kg)   BMI 26.34 kg/m   Body mass index is 26.34 kg/m.  Advanced Directives 04/25/2018 03/29/2018 03/28/2018 12/27/2017 11/27/2017 09/28/2017 09/20/2017  Does Patient Have a Medical Advance Directive? Yes Yes Yes Yes Yes Yes Yes  Type of Paramedic of [place];Living will;Out of facility DNR (pink MOST or yellow form) [place];Living will;Out of facility DNR (pink MOST or yellow form) [place];Living will;Out of facility DNR (pink MOST or yellow form) [place];Out of facility DNR (pink MOST or yellow form);Living will [place];Out of facility DNR (pink MOST or yellow form);Living will [place];Out of facility DNR (pink MOST or yellow form);Living will [place];Living will;Out of facility DNR (pink MOST or yellow form)  Does patient want to make changes to medical advance directive? No - Patient declined No - Patient declined No - Patient declined - - - No - Patient declined  Copy of [place] in Chart? Yes - validated most recent copy scanned in chart (See row information) Yes - validated most recent copy scanned in chart (See row information) Yes - validated most recent copy scanned in chart (See row information) Yes Yes Yes Yes  Pre-existing out of facility DNR order (yellow form or pink MOST form) Pink MOST form placed in chart (order not valid for inpatient use) Pink MOST form placed in chart (order not valid for inpatient use) Pink MOST form placed in chart (order not valid for inpatient use)  Pink MOST form placed in chart (order not valid for inpatient use) Pink MOST form placed in chart (order not valid for inpatient use) Pink MOST form placed in chart (order not valid for inpatient use) Pink MOST form placed in chart (order not valid for inpatient use)    Tobacco Social History   Tobacco Use  Smoking Status Never Smoker  Smokeless Tobacco Never Used     Counseling given: Not Answered   Clinical Intake:  Pre-visit preparation completed: No  Pain : No/denies pain     Diabetes: No  How often do you need to have someone help you when you read instructions, pamphlets, or other written materials from your doctor or pharmacy?: 2 - Rarely  Interpreter Needed?: No  Information entered by :: Tyson Dense, RN  Past Medical History:  Diagnosis Date  . Abnormal mammogram, unspecified 04/19/2011  . Allergy   . Anxiety   . Backache, unspecified 01/21/2005  . Cervicalgia 08/20/2013  . Chronic kidney disease, stage II (mild) 04/03/2009  . Chronic rhinitis 09/28/2010  . DDD (degenerative disc disease)   . Depression   . Dermatophytosis of nail 01/21/2005  . Diverticulosis of colon (without mention of hemorrhage) 06/17/2005  . Edema 11/26/2004  . GERD (gastroesophageal reflux disease)   . Hiatal hernia   . High cholesterol   . Hyperglycemia 11/18/2014  . Hypertension   . Insomnia, unspecified 10/18/2011  . Left inguinal hernia 04/24/2014  . Nocturia 09/19/2008  . Osteoporosis, senile 11/18/2014  . Other abnormal blood chemistry 04/03/2007  .  Pain in joint, pelvic region and thigh 11/12/2011  . Pain in joint, shoulder region 08/15/2006  . Pain in limb 08/26/2005  . Palpitations 09/29/2009  . PAOD (peripheral arterial occlusive disease) ([place]) 11/18/2014   Right leg; diminished popliteal, dorsalis pedis, and posterior tibial artery pulsations   . Postmenopausal atrophic vaginitis 12/14/2004  . Scoliosis   . Senile osteoporosis 12/14/2004  . Tension headache 10/04/2005  . Unspecified  cataract 11/26/2004  . Unspecified constipation 01/21/2005  . Unspecified urinary incontinence 11/26/2004  . Vaginal stenosis 11/18/2014   Tight band about 1/2 inches into the vagina which will not allow passage of my index finger.    Past Surgical History:  Procedure Laterality Date  . ABDOMINAL HYSTERECTOMY  1969   Dr.Braun   . bilateral eyelid surgery  2005   Dr.Scott  . BILATERAL OOPHORECTOMY  2002  . BREAST SURGERY     benign tumor removal, left breast 1969  . CATARACT EXTRACTION     Right  . CHOLECYSTECTOMY  1990   Dr.Moore   . COLONOSCOPY  3/18/20008   inflammation, diverticular associated colitis -Dr. Ardis Hughs  . COSMETIC SURGERY    . ESOPHAGOGASTRODUODENOSCOPY N/A 01/24/2014   Procedure: ESOPHAGOGASTRODUODENOSCOPY (EGD);  Surgeon: Irene Shipper, MD;  Location[facility];  Service: Endoscopy;  Laterality: N/A;  . ESOPHAGOGASTRODUODENOSCOPY ENDOSCOPY  04/04/14   Dr. Ardis Hughs  . EYE SURGERY    . FOREIGN BODY REMOVAL N/A 01/24/2014   Procedure: FOREIGN BODY REMOVAL;  Surgeon: Irene Shipper, MD;  Location: [facility];  Service: Endoscopy;  Laterality: N/A;  . HIP PINNING,CANNULATED  11/07/2011   Procedure: CANNULATED HIP PINNING;  Surgeon: Jessy Oto, MD;  Location: [facility];  Service: Orthopedics;  Laterality: Left;  . ORIF FEMORAL NECK FRACTURE W/ [facility]  11/07/2011   Dr.Nitka   . TONSILLECTOMY     Dr.Joe Tamala Julian  . TUBAL LIGATION    . VAGINAL PROLAPSE REPAIR  2002   Dr.Horback    Family History  Problem Relation Age of Onset  . Heart disease Mother   . Stroke Mother   . Emphysema Father   . Arthritis Father   . Alcohol abuse Brother   . Heart disease Son   . Leukemia Son   . Esophageal cancer Neg Hx   . Stomach cancer Neg Hx   . Colon cancer Neg Hx    Social History   Socioeconomic History  . Marital status: Widowed    Spouse name: Not on file  . Number of children: Not on file  . Years of education: Not on file  . Highest education level: Not on file    Occupational History  . 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 Financial resource strain: Not hard at all  . Food insecurity:    Worry: Never true    Inability: Never true  . Transportation needs:    Medical: No    Non-medical: No  Tobacco Use  . Smoking status: Never Smoker  . Smokeless tobacco: Never Used  Substance and Sexual Activity  . Alcohol use: Yes    Alcohol/week: 0.0 standard drinks    Comment: one glass of wine in evening  . Drug use: No  . Sexual activity: Never  Lifestyle  . Physical activity:    Days per week: 7 days    Minutes per session: 30 min  . Stress: To some extent  Relationships  . Social connections:    Talks on phone:  More than three times a week    Gets together: More than three times a week    Attends religious service: 1 to 4 times per year    Active member of club or organization: Yes    Attends meetings of clubs or organizations: Never    Relationship status: Widowed  Other Topics Concern  . Not on file  Social History Narrative   Lives at [facility] since 04/14/2005   Widowed (husband expired 2014)   Has Living Will, POA, MOST   Exercise: water aerobic  5 times a week   Walks with walker   Never smoked   Drinks moderate amount of caffeinate beverages daily   Alcohol none       Outpatient Encounter Medications as of 04/25/2018  Medication Sig  . acetaminophen-codeine (TYLENOL #3) 300-30 MG tablet Take 1 tablet by mouth every 6 (six) hours as needed for moderate pain.  Marland Kitchen antiseptic oral rinse (BIOTENE) LIQD 15 mLs by Mouth Rinse route every 4 (four) hours as needed for dry mouth.   Marland Kitchen aspirin 81 MG chewable tablet Chew 81 mg by mouth daily.  Marland Kitchen buPROPion (WELLBUTRIN XL) 150 MG 24 hr tablet Take 300 mg by mouth daily.   . Calcium Carb-Cholecalciferol (CALTRATE 600+D) 600-800 MG-UNIT TABS Take 1 tablet by mouth 2 (two) times daily.  . chlorpheniramine-HYDROcodone (TUSSIONEX PENNKINETIC ER) 10-8  MG/5ML SUER Take 5 mLs by mouth every 12 (twelve) hours as needed for cough.  . cholecalciferol (VITAMIN D) 1000 UNITS tablet Take 1,000 Units by mouth daily.  . clonazePAM (KLONOPIN) 0.5 MG tablet Take 0.25 mg by mouth 4 (four) times daily.   Marland Kitchen docusate sodium (COLACE) 100 MG capsule Take 1 capsule (100 mg total) by mouth daily.  . Eyelid Cleansers (OCUSOFT EYELID CLEANSING) PADS Apply 1 application topically daily.  . Glucosamine HCl 1000 MG TABS Take 2,000 mg by mouth 2 (two) times daily. 2 tabs  . ibandronate (BONIVA) 150 MG tablet Take 1 tablet (150 mg total) by mouth every 30 (thirty) days. Take in the morning with a full glass of water, on an empty stomach, and do not take anything else by mouth or lie down for the next 30 min.  . meloxicam (MOBIC) 7.5 MG tablet TAKE 1 TABLET BY MOUTH EVERY DAY AS NEEDED FOR ARTHRITIS  . Menthol, Topical Analgesic, (BIOFREEZE EX) Apply 1 application topically daily as needed.   . mirtazapine (REMERON) 30 MG tablet Take 30 mg by mouth at bedtime.  . Multiple Vitamin (MULTIVITAMIN WITH MINERALS) TABS Take 1 tablet by mouth daily.  . Multiple Vitamins-Minerals (PRESERVISION AREDS 2+MULTI VIT PO) Take 1 tablet by mouth 2 (two) times daily.  Marland Kitchen NIFEdipine (PROCARDIA XL/ADALAT-CC) 60 MG 24 hr tablet TAKE 1 TABLET BY MOUTH EVERY DAY TO CONTROL BLOOD PRESSURE  . pantoprazole (PROTONIX) 40 MG tablet One daily to reduce stomach acid  . Polyethyl Glycol-Propyl Glycol (SYSTANE) 0.4-0.3 % SOLN Place 1 drop into both eyes 2 (two) times daily. Can also apply  one drop to both eyes BID-PRN  . polyethylene glycol (MIRALAX / GLYCOLAX) packet Take 17 g by mouth daily.   . pravastatin (PRAVACHOL) 10 MG tablet Take 10 mg by mouth daily.  . sertraline (ZOLOFT) 100 MG tablet Take 150 mg by mouth daily. 1 and 1/2 tabs   No facility-administered encounter medications on file as of 04/25/2018.     Activities of Daily Living In your present state of health, do you have any  difficulty performing  the following activities: 04/25/2018  Hearing? N  Vision? N  Difficulty concentrating or making decisions? Y  Walking or climbing stairs? Y  Dressing or bathing? Y  Doing errands, shopping? Y  Preparing Food and eating ? Y  Using the Toilet? Y  In the past six months, have you accidently leaked urine? Y  Comment wears pads  Do you have problems with loss of bowel control? N  Managing your Medications? Y  Managing your Finances? Y  Housekeeping or managing your Housekeeping? Y  Some recent data might be hidden    Patient Care Team: Wardell Honour, MD as PCP - General (Family Medicine) [place], [facility] Clent Jacks, MD as Consulting Physician (Ophthalmology) Milus Banister, MD as Consulting Physician (Gastroenterology) Norma Fredrickson, MD as Consulting Physician (Psychiatry) Jessy Oto, MD as Consulting Physician (Orthopedic Surgery) Bjorn Loser, MD as Consulting Physician (Urology)    Assessment:   This is a routine wellness examination for [place].  Exercise Activities and Dietary recommendations Current Exercise Habits: Structured exercise class, Type of exercise: stretching, Time (Minutes): 30, Frequency (Times/Week): 7, Weekly Exercise (Minutes/Week): 210, Intensity: Mild, Exercise limited by: orthopedic condition(s)  Goals   None     Fall Risk Fall Risk  04/25/2018 04/21/2017 10/17/2016 10/14/2016 10/13/2016  Falls in the past year? 0 No No No No  Number falls in past yr: 0 - - - -  Injury with Fall? 0 - - - -   Is the patient's home free of loose throw rugs in walkways, pet beds, electrical cords, etc?   yes      Grab bars in the bathroom? yes      Handrails on the stairs?   yes      Adequate lighting?   yes  Depression Screen PHQ 2/9 Scores 04/25/2018 04/21/2017 05/31/2016 10/20/2015  PHQ - 2 Score 6 6 0 1  PHQ- 9 Score 10 15 - -     Cognitive Function MMSE - Mini Mental State Exam 09/20/2017 05/31/2016 11/18/2014  Orientation  to time 5 5 5   Orientation to Place 5 5 5   Registration 3 3 3   Attention/ Calculation 5 5 5   Recall 3 3 3   Language- name 2 objects 2 2 2   Language- repeat 1 1 1   Language- follow 3 step command 3 3 3   Language- read & follow direction 1 1 1   Write a sentence 1 1 1   Copy design - 1 1  Total score - 30 30        Immunization History  Administered Date(s) Administered  . Influenza Whole 02/14/2012, 02/14/2013  . Influenza, High Dose Seasonal PF 03/14/2017  . Influenza-Unspecified 02/27/2014, 02/12/2015, 02/25/2016, 03/15/2017, 02/19/2018  . Pneumococcal Conjugate-13 07/13/2015  . Pneumococcal Polysaccharide-23 05/16/1998  . Td 05/16/2004  . Zoster 05/16/2005  . Zoster Recombinat (Shingrix) 02/20/2018    Qualifies for Shingles Vaccine? Waiting for second shot  Screening Tests Health Maintenance  Topic Date Due  . MAMMOGRAM  12/20/2017  . TETANUS/TDAP  05/16/2018 (Originally 05/16/2014)  . INFLUENZA VACCINE  Completed  . DEXA SCAN  Completed  . PNA vac Low Risk Adult  Completed    Cancer Screenings: Lung: Low Dose CT Chest recommended if Age 67-80 years, 30 pack-year currently smoking OR have quit w/in 15years. Patient does not qualify. Breast:  Up to date on Mammogram? Yes   Up to date of Bone Density/Dexa? Yes Colorectal: up to date  Additional Screenings:  Hepatitis C Screening:  TDAP due: declined  Plan:    I have personally reviewed and addressed the Medicare Annual Wellness questionnaire and have noted the following in the patient's chart:  A. Medical and social history B. Use of alcohol, tobacco or illicit drugs  C. Current medications and supplements D. Functional ability and status E.  Nutritional status F.  Physical activity G. Advance directives H. List of other physicians I.  Hospitalizations, surgeries, and ER visits in previous 12 months J.  [place] to include hearing, vision, cognitive, depression L. Referrals and appointments -  none  In addition, I have reviewed and discussed with patient certain preventive protocols, quality metrics, and best practice recommendations. A written personalized care plan for preventive services as well as general preventive health recommendations were provided to patient.  See attached scanned questionnaire for additional information.   Signed,   Tyson Dense, RN Nurse Health Advisor  Patient concerns: requested lipid panel

## 2018-04-25 NOTE — Patient Instructions (Signed)
Tina Hampton , I have completed the annual wellness visit as per Medicare guidelines. The attached information is provided for the patient's family, care providers, and facility of residence.  Screening recommendations/referrals: Colonoscopy excluded, over age 82 Mammogram excluded, over age 30 Bone Density up to date Recommended yearly ophthalmology/optometry visit for glaucoma screening and checkup Recommended yearly dental visit for hygiene and checkup  Vaccinations: Influenza vaccine up to date Pneumococcal vaccine up to date, completed Tdap vaccine due, declined Shingles vaccine up to date, completed    Advanced directives: in chart  Conditions/risks identified: Fall Risk  Next appointment: Dr. Sabra Heck makes rounds   Preventive Care 43 Years and Older, Female Preventive care refers to lifestyle choices and visits with your health care provider that can promote health and wellness. What does preventive care include?  A yearly physical exam. This is also called an annual well check.  Dental exams once or twice a year.  Routine eye exams. Ask your health care provider how often you should have your eyes checked.  Personal lifestyle choices, including:  Daily care of your teeth and gums.  Regular physical activity.  Eating a healthy diet.  Avoiding tobacco and drug use.  Limiting alcohol use.  Taking vitamin and mineral supplements as recommended by your health care provider. What happens during an annual well check? The services and screenings done by your health care provider during your annual well check will depend on your age, overall health, lifestyle risk factors, and family history of disease. Counseling  Your health care provider may ask you questions about your:  Alcohol use.  Tobacco use.  Drug use.  Emotional well-being.  Home and relationship well-being.  Eating habits.  History of falls.  Memory and ability to understand  (cognition).  Work and work Statistician.  Reproductive health. Screening  You may have the following tests or measurements:  Height, weight, and BMI.  Blood pressure.  Lipid and cholesterol levels. These may be checked every 5 years, or more frequently if you are over 75 years old.  Skin check.  Lung cancer screening. You may have this screening every year starting at age 97 if you have a 30-pack-year history of smoking and currently smoke or have quit within the past 15 years.  Fecal occult blood test (FOBT) of the stool. You may have this test every year starting at age 79.  Flexible sigmoidoscopy or colonoscopy. You may have a sigmoidoscopy every 5 years or a colonoscopy every 10 years starting at age 82.  Hepatitis C blood test.  Hepatitis B blood test.  Diabetes screening. This is done by checking your blood sugar (glucose) after you have not eaten for a while (fasting). You may have this done every 1-3 years.  Bone density scan. This is done to screen for osteoporosis. You may have this done starting at age 19.  Mammogram. This may be done every 1-2 years. Talk to your health care provider about how often you should have regular mammograms. Talk with your health care provider about your test results, treatment options, and if necessary, the need for more tests. Vaccines  Your health care provider may recommend certain vaccines, such as:  Influenza vaccine. This is recommended every year.  Tetanus, diphtheria, and acellular pertussis (Tdap, Td) vaccine. You may need a Td booster every 10 years.  Zoster vaccine. You may need this after age 41.  Pneumococcal 13-valent conjugate (PCV13) vaccine. One dose is recommended after age 34.  Pneumococcal polysaccharide (PPSV23) vaccine. One  dose is recommended after age 15. Talk to your health care provider about which screenings and vaccines you need and how often you need them. This information is not intended to replace  advice given to you by your health care provider. Make sure you discuss any questions you have with your health care provider. Document Released: 05/29/2015 Document Revised: 01/20/2016 Document Reviewed: 03/03/2015 Elsevier Interactive Patient Education  2017 [place] can cause injuries. They can happen to people of all ages. There are many things you can do to make your home safe and to help prevent falls.  What can I do in the bathroom?  Use night lights.  Install grab bars by the toilet and in the tub and shower. Do not use towel bars as grab bars.  Use non-skid mats or decals in the tub or shower.  If you need to sit down in the shower, use a plastic, non-slip stool.  Keep the floor dry. Clean up any water that spills on the floor as soon as it happens.  Remove soap buildup in the tub or shower regularly.  Attach bath mats securely with double-sided non-slip rug tape.  Do not have throw rugs and other things on the floor that can make you trip. What can I do in the bedroom?  Use night lights.  Make sure that you have a light by your bed that is easy to reach.  Do not use any sheets or blankets that are too big for your bed. They should not hang down onto the floor.  Have a firm chair that has side arms. You can use this for support while you get dressed.  Do not have throw rugs and other things on the floor that can make you trip. What else can I do to help prevent falls?  Wear shoes that:  Do not have high heels.  Have rubber bottoms.  Are comfortable and fit you well.  Are closed at the toe. Do not wear sandals.  If you use a stepladder:  Make sure that it is fully opened. Do not climb a closed stepladder.  Make sure that both sides of the stepladder are locked into place.  Ask someone to hold it for you, if possible.  Clearly mark and make sure that you can see:  Any grab bars or handrails.  First and last  steps.  Where the edge of each step is.  Use tools that help you move around (mobility aids) if they are needed. These include:  Canes.  Walkers.  Scooters.  Crutches.  Turn on the lights when you go into a dark area. Replace any light bulbs as soon as they burn out.  Set up your furniture so you have a clear path. Avoid moving your furniture around.  If any of your floors are uneven, fix them.  Review your medicines with your doctor. Some medicines can make you feel dizzy. This can increase your chance of falling. Ask your doctor what other things that you can do to help prevent falls. This information is not intended to replace advice given to you by your health care provider. Make sure you discuss any questions you have with your health care provider. Document Released: 02/26/2009 Document Revised: 10/08/2015 Document Reviewed: 06/06/2014 Elsevier Interactive Patient Education  2017 [employer].

## 2018-04-26 DIAGNOSIS — F411 Generalized anxiety disorder: Secondary | ICD-10-CM | POA: Diagnosis not present

## 2018-05-02 DIAGNOSIS — F411 Generalized anxiety disorder: Secondary | ICD-10-CM | POA: Diagnosis not present

## 2018-05-15 ENCOUNTER — Other Ambulatory Visit: Payer: Self-pay | Admitting: Internal Medicine

## 2018-05-15 ENCOUNTER — Other Ambulatory Visit: Payer: Self-pay | Admitting: Family Medicine

## 2018-05-15 DIAGNOSIS — E785 Hyperlipidemia, unspecified: Secondary | ICD-10-CM

## 2018-05-17 ENCOUNTER — Other Ambulatory Visit: Payer: Medicare Other

## 2018-05-17 DIAGNOSIS — E785 Hyperlipidemia, unspecified: Secondary | ICD-10-CM | POA: Diagnosis not present

## 2018-05-17 LAB — LIPID PANEL
CHOLESTEROL: 186 (ref 0–200)
HDL: 50 (ref 35–70)
LDL Cholesterol: 106
Triglycerides: 179 — AB (ref 40–160)

## 2018-05-18 DIAGNOSIS — F411 Generalized anxiety disorder: Secondary | ICD-10-CM | POA: Diagnosis not present

## 2018-05-22 DIAGNOSIS — F411 Generalized anxiety disorder: Secondary | ICD-10-CM | POA: Diagnosis not present

## 2018-05-22 DIAGNOSIS — F331 Major depressive disorder, recurrent, moderate: Secondary | ICD-10-CM | POA: Diagnosis not present

## 2018-06-12 ENCOUNTER — Encounter: Payer: Self-pay | Admitting: Internal Medicine

## 2018-06-28 ENCOUNTER — Ambulatory Visit (INDEPENDENT_AMBULATORY_CARE_PROVIDER_SITE_OTHER): Payer: Medicare Other | Admitting: Specialist

## 2018-06-28 ENCOUNTER — Encounter (INDEPENDENT_AMBULATORY_CARE_PROVIDER_SITE_OTHER): Payer: Self-pay | Admitting: Specialist

## 2018-06-28 ENCOUNTER — Ambulatory Visit (INDEPENDENT_AMBULATORY_CARE_PROVIDER_SITE_OTHER): Payer: Self-pay

## 2018-06-28 VITALS — BP 162/88 | HR 80 | Ht 63.0 in | Wt 136.0 lb

## 2018-06-28 DIAGNOSIS — M25512 Pain in left shoulder: Secondary | ICD-10-CM | POA: Diagnosis not present

## 2018-06-28 DIAGNOSIS — M12811 Other specific arthropathies, not elsewhere classified, right shoulder: Secondary | ICD-10-CM | POA: Diagnosis not present

## 2018-06-28 DIAGNOSIS — M25511 Pain in right shoulder: Secondary | ICD-10-CM

## 2018-06-28 DIAGNOSIS — M19012 Primary osteoarthritis, left shoulder: Secondary | ICD-10-CM | POA: Diagnosis not present

## 2018-06-28 DIAGNOSIS — R2689 Other abnormalities of gait and mobility: Secondary | ICD-10-CM | POA: Diagnosis not present

## 2018-06-28 DIAGNOSIS — M419 Scoliosis, unspecified: Secondary | ICD-10-CM | POA: Diagnosis not present

## 2018-06-28 DIAGNOSIS — M810 Age-related osteoporosis without current pathological fracture: Secondary | ICD-10-CM | POA: Diagnosis not present

## 2018-06-28 NOTE — Progress Notes (Signed)
Office Visit Note   Patient: Tina Hampton           Date of Birth: April 22, 1926           MRN: [id number] Visit Date: 06/28/2018              Requested by: Wardell Honour, MD [address] [place], [place] [address] PCP: Virgie Dad, MD   Assessment & Plan: Visit Diagnoses:  1. Left shoulder pain, unspecified chronicity   2. Right shoulder pain, unspecified chronicity   3. Scoliosis of thoracolumbar spine, unspecified scoliosis type   4. Age-related osteoporosis without current pathological fracture   5. Balance problem   6. Rotator cuff arthropathy of right shoulder   7. Primary osteoarthritis, left shoulder     Plan: Avoid overhead lifting and overhead use of the arms. Pillows to keep from sleeping directly on the shoulders Limited lifting to less than 10 lbs. Ice or heat for relief. NSAIDs are helpful, such as alleve or motrin, be careful not to use in excess as they place burdens on the kidney. PT for Stretching exercise help and strengthening is helpful to build endurance. PT also to work on T-L core strengthening and balance and coordination exercises.  Avoid frequent bending and stooping  No lifting greater than 10 lbs. May use ice or moist heat for pain. Weight loss is of benefit.. Exercise is important to improve your indurance and does allow people to function better inspite of back pain.  Follow-Up Instructions: Return in about 6 weeks (around 08/09/2018).   Orders:  Orders Placed This Encounter  Procedures  . XR Shoulder Left  . XR Shoulder Right  . Ambulatory referral to Physical Therapy   No orders of the defined types were placed in this encounter.     Procedures: No procedures performed   Clinical Data: No additional findings.   Subjective: Chief Complaint  Patient presents with  . Right Shoulder - Pain  . Left Shoulder - Pain    85 year old female with left anterior shoulder pain. Right now they don't hurt, some morning the  exercises are able to be done other times she has difficulty performing repetitive ROM of the shoulders right greater than left. She uses biofreeze in the AM. She is not taking motrin or aspirin She takes an occasional tylenol. She would like to try more PT for her back. She has difficulty standing upright and holding the weight up with her arms only goes so far. She has osteoporosis and a collapsing degenerative thoracolumbar scoliosis.    Review of Systems  Constitutional: Negative.   HENT: Negative.   Eyes: Negative.   Respiratory: Negative.   Cardiovascular: Negative.   Gastrointestinal: Negative.   Endocrine: Negative.   Genitourinary: Negative.   Musculoskeletal: Negative.   Skin: Negative.   Allergic/Immunologic: Negative.   Neurological: Negative.   Hematological: Negative.   Psychiatric/Behavioral: Negative.      Objective: Vital Signs: BP (!) 162/88 (BP Location: Left Arm, Patient Position: Sitting)   Pulse 80   Ht 5\' 3"  (1.6 m)   Wt 136 lb (61.7 kg)   BMI 24.09 kg/m   Physical Exam Constitutional:      Appearance: She is well-developed.  HENT:     Head: Normocephalic and atraumatic.  Eyes:     Pupils: Pupils are equal, round, and reactive to light.  Neck:     Musculoskeletal: Normal range of motion and neck supple.  Pulmonary:  Effort: Pulmonary effort is normal.     Breath sounds: Normal breath sounds.  Abdominal:     General: Bowel sounds are normal.     Palpations: Abdomen is soft.  Skin:    General: Skin is warm and dry.  Neurological:     Mental Status: She is alert and oriented to person, place, and time.  Psychiatric:        Behavior: Behavior normal.        Thought Content: Thought content normal.        Judgment: Judgment normal.     Back Exam   Tenderness  The patient is experiencing tenderness in the lumbar.  Range of Motion  Extension: abnormal  Flexion: normal   Muscle Strength  The patient has normal back strength. Right  Quadriceps:  5/5  Left Quadriceps:  5/5  Right Hamstrings:  5/5  Left Hamstrings:  5/5   Tests  Straight leg raise right: negative Straight leg raise left: negative  Reflexes  Patellar: 2/4 Achilles: 2/4 Babinski's sign: normal   Other  Toe walk: normal Heel walk: normal Sensation: normal Gait: normal  Erythema: no back redness Scars: absent      Specialty Comments:  No specialty comments available.  Imaging: Xr Shoulder Left  Result Date: 06/28/2018 Left shoulder 3 way view with glenohumeral narrowing to bone on bone appearance on the axillary lateral view and on the AP view. The SAS is narrowed at 8.6 mm . There is flattening of the humeral head and cystic changes of the right humeral head.   Xr Shoulder Right  Result Date: 06/28/2018 Right shoulder 3 way view with minimal glenohumeral narrowing no bone on bone appearance on the axillary lateral view or the AP view.  The SAS is severely narrowed at 3 mm . There is a large anterior osteophyte anterior acromion.    PMFS History: Patient Active Problem List   Diagnosis Date Noted  . Severe episode of recurrent major depressive disorder, without psychotic features ([place]) 05/03/2017  . GAD (generalized anxiety disorder) 05/03/2017  . Spinal stenosis of lumbar region with neurogenic claudication 03/01/2017  . Major depression, recurrent, chronic ([place]) 03/01/2017  . Primary osteoarthritis involving multiple joints 03/01/2017  . OAB (overactive bladder) 03/01/2017  . Rash 10/25/2016  . Hearing loss 10/25/2016  . Chronic bilateral low back pain without sciatica 08/17/2016  . Pain in joint, shoulder region 05/31/2016  . Sprain of anterior talofibular ligament 04/27/2016  . Pain of left scapula 10/20/2015  . Tremor 06/02/2015  . Vaginal stenosis 11/18/2014  . PAOD (peripheral arterial occlusive disease) ([place]) 11/18/2014  . Age-related osteoporosis without current pathological fracture 11/18/2014  . Hyperglycemia  11/18/2014  . Left inguinal hernia 04/24/2014  . Diverticulosis of colon with hemorrhage 04/24/2014  . Aortic ejection murmur 04/24/2014  . Dysphagia 01/24/2014  . Esophageal stricture 01/24/2014  . Hip fracture ([place]) 11/07/2011  . GERD (gastroesophageal reflux disease)   . Hypertension   . Hyperlipidemia LDL goal <100   . Hiatal hernia   . Scoliosis   . DDD (degenerative disc disease)   . Depression   . Senile osteoporosis 12/14/2004  . Postmenopausal atrophic vaginitis 12/14/2004  . Edema 11/26/2004  . Urinary incontinence 11/26/2004   Past Medical History:  Diagnosis Date  . Abnormal mammogram, unspecified 04/19/2011  . Allergy   . Anxiety   . Backache, unspecified 01/21/2005  . Cervicalgia 08/20/2013  . Chronic kidney disease, stage II (mild) 04/03/2009  . Chronic rhinitis 09/28/2010  . DDD (  degenerative disc disease)   . Depression   . Dermatophytosis of nail 01/21/2005  . Diverticulosis of colon (without mention of hemorrhage) 06/17/2005  . Edema 11/26/2004  . GERD (gastroesophageal reflux disease)   . Hiatal hernia   . High cholesterol   . Hyperglycemia 11/18/2014  . Hypertension   . Insomnia, unspecified 10/18/2011  . Left inguinal hernia 04/24/2014  . Nocturia 09/19/2008  . Osteoporosis, senile 11/18/2014  . Other abnormal blood chemistry 04/03/2007  . Pain in joint, pelvic region and thigh 11/12/2011  . Pain in joint, shoulder region 08/15/2006  . Pain in limb 08/26/2005  . Palpitations 09/29/2009  . PAOD (peripheral arterial occlusive disease) ([place]) 11/18/2014   Right leg; diminished popliteal, dorsalis pedis, and posterior tibial artery pulsations   . Postmenopausal atrophic vaginitis 12/14/2004  . Scoliosis   . Senile osteoporosis 12/14/2004  . Tension headache 10/04/2005  . Unspecified cataract 11/26/2004  . Unspecified constipation 01/21/2005  . Unspecified urinary incontinence 11/26/2004  . Vaginal stenosis 11/18/2014   Tight band about 1/2 inches into the vagina which will not  allow passage of my index finger.     Family History  Problem Relation Age of Onset  . Heart disease Mother   . Stroke Mother   . Emphysema Father   . Arthritis Father   . Alcohol abuse Brother   . Heart disease Son   . Leukemia Son   . Esophageal cancer Neg Hx   . Stomach cancer Neg Hx   . Colon cancer Neg Hx     Past Surgical History:  Procedure Laterality Date  . ABDOMINAL HYSTERECTOMY  1969   Dr.Braun   . bilateral eyelid surgery  2005   Dr.Scott  . BILATERAL OOPHORECTOMY  2002  . BREAST SURGERY     benign tumor removal, left breast 1969  . CATARACT EXTRACTION     Right  . CHOLECYSTECTOMY  1990   Dr.Moore   . COLONOSCOPY  3/18/20008   inflammation, diverticular associated colitis -Dr. Ardis Hughs  . COSMETIC SURGERY    . ESOPHAGOGASTRODUODENOSCOPY N/A 01/24/2014   Procedure: ESOPHAGOGASTRODUODENOSCOPY (EGD);  Surgeon: Irene Shipper, MD;  Location: [facility] ENDOSCOPY;  Service: Endoscopy;  Laterality: N/A;  . ESOPHAGOGASTRODUODENOSCOPY ENDOSCOPY  04/04/14   Dr. Ardis Hughs  . EYE SURGERY    . FOREIGN BODY REMOVAL N/A 01/24/2014   Procedure: FOREIGN BODY REMOVAL;  Surgeon: Irene Shipper, MD;  Location: [facility];  Service: Endoscopy;  Laterality: N/A;  . HIP PINNING,CANNULATED  11/07/2011   Procedure: CANNULATED HIP PINNING;  Surgeon: Jessy Oto, MD;  Location: [facility];  Service: Orthopedics;  Laterality: Left;  . ORIF FEMORAL NECK FRACTURE W/ [facility]  11/07/2011   Dr.   . TONSILLECTOMY     Dr.Joe Tamala Julian  . TUBAL LIGATION    . VAGINAL PROLAPSE REPAIR  2002   Dr.Horback    Social History   Occupational History  . 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 Smoking status: Never Smoker  . Smokeless tobacco: Never Used  Substance and Sexual Activity  . Alcohol use: Yes    Alcohol/week: 0.0 standard drinks    Comment: one glass of wine in evening  . Drug use: No  . Sexual activity: Never

## 2018-06-28 NOTE — Patient Instructions (Signed)
Avoid overhead lifting and overhead use of the arms. Pillows to keep from sleeping directly on the shoulders Limited lifting to less than 10 lbs. Ice or heat for relief. NSAIDs are helpful, such as alleve or motrin, be careful not to use in excess as they place burdens on the kidney. PT for Stretching exercise help and strengthening is helpful to build endurance. PT also to work on T-L core strengthening and balance and coordination exercises.  Avoid frequent bending and stooping  No lifting greater than 10 lbs. May use ice or moist heat for pain. Weight loss is of benefit.. Exercise is important to improve your indurance and does allow people to function better inspite of back pain.

## 2018-07-02 DIAGNOSIS — M6281 Muscle weakness (generalized): Secondary | ICD-10-CM | POA: Diagnosis not present

## 2018-07-02 DIAGNOSIS — R2681 Unsteadiness on feet: Secondary | ICD-10-CM | POA: Diagnosis not present

## 2018-07-02 DIAGNOSIS — M1990 Unspecified osteoarthritis, unspecified site: Secondary | ICD-10-CM | POA: Diagnosis not present

## 2018-07-04 DIAGNOSIS — M6281 Muscle weakness (generalized): Secondary | ICD-10-CM | POA: Diagnosis not present

## 2018-07-04 DIAGNOSIS — R2681 Unsteadiness on feet: Secondary | ICD-10-CM | POA: Diagnosis not present

## 2018-07-04 DIAGNOSIS — M1990 Unspecified osteoarthritis, unspecified site: Secondary | ICD-10-CM | POA: Diagnosis not present

## 2018-07-05 ENCOUNTER — Non-Acute Institutional Stay: Payer: Medicare Other | Admitting: Family

## 2018-07-05 ENCOUNTER — Encounter: Payer: Self-pay | Admitting: Family

## 2018-07-05 DIAGNOSIS — K5901 Slow transit constipation: Secondary | ICD-10-CM

## 2018-07-05 DIAGNOSIS — M150 Primary generalized (osteo)arthritis: Secondary | ICD-10-CM

## 2018-07-05 DIAGNOSIS — E785 Hyperlipidemia, unspecified: Secondary | ICD-10-CM | POA: Diagnosis not present

## 2018-07-05 DIAGNOSIS — I10 Essential (primary) hypertension: Secondary | ICD-10-CM

## 2018-07-05 DIAGNOSIS — M159 Polyosteoarthritis, unspecified: Secondary | ICD-10-CM

## 2018-07-05 DIAGNOSIS — K219 Gastro-esophageal reflux disease without esophagitis: Secondary | ICD-10-CM | POA: Diagnosis not present

## 2018-07-05 DIAGNOSIS — F339 Major depressive disorder, recurrent, unspecified: Secondary | ICD-10-CM | POA: Diagnosis not present

## 2018-07-05 NOTE — Progress Notes (Signed)
Location:  [facility] [place] Room Number: 8 Place of Service:  ALF [phone]) Provider:   FNP-C   Virgie Dad, MD  Patient Care Team: Virgie Dad, MD as PCP - General (Internal Medicine) Melina Modena, [facility] Clent Jacks, MD as Consulting Physician (Ophthalmology) Milus Banister, MD as Consulting Physician (Gastroenterology) Norma Fredrickson, MD as Consulting Physician (Psychiatry) Jessy Oto, MD as Consulting Physician (Orthopedic Surgery) Bjorn Loser, MD as Consulting Physician (Urology)  Extended Emergency Contact Information Primary Emergency Contact: [address] [place], [place] [address] Johnnette Litter of [place] Phone: [phone] Mobile Phone: [phone] Relation: Son  Code Status:  Full Code /MOST form  Goals of care: Advanced Directive information Advanced Directives 07/05/2018  Does Patient Have a Medical Advance Directive? Yes  Type of Advance Directive Out of facility DNR (pink MOST or yellow form);[place];Living will  Does patient want to make changes to medical advance directive? No - Patient declined  Copy of [place] in Chart? Yes - validated most recent copy scanned in chart (See row information)  Pre-existing out of facility DNR order (yellow form or pink MOST form) Yellow form placed in chart (order not valid for inpatient use)     Chief Complaint  Patient presents with  . Medical Management of Chronic Issues    Routine Visit    HPI:  Pt is a 85 y.o. female seen today [place] for medical management of chronic diseases.she is seen in her room today.she denies any acute issues during the visit.she states has been doing well and her depression/anxiety  is much better " feeling Good today".she is on sertraline 150 mg tablet daily,mirtazapine 30 mg tablet at bedtime,Wellbutrin XL 300 mg tablet daily and clonazepam 0.25 mg tablet four times daily.    She  states working with Therapy for her right shoulder Rotator cuff and left shoulder arthritis.also states continues to have lower back pain.On mobic7.5 mg tablet every day as needed for pain.she does participate in swimming exercise twice a week which helps with her arthritic pain. She has had no recent fall episodes,acute illness or hospitalization.  Hypertension- she is concerned that her some of her blood pressure readings have been in the 150's/80's.she denies any headache,dizziness ,shortness of breath or chest pain.B/p log reviewed readings in the 140's/80's-150's/80's.Her pain could be contributing to high readings.currently on Nifedipine 60 mg tablet daily.on ASA and Statin for chest prophylaxis.      Past Medical History:  Diagnosis Date  . Abnormal mammogram, unspecified 04/19/2011  . Allergy   . Anxiety   . Backache, unspecified 01/21/2005  . Cervicalgia 08/20/2013  . Chronic kidney disease, stage II (mild) 04/03/2009  . Chronic rhinitis 09/28/2010  . DDD (degenerative disc disease)   . Depression   . Dermatophytosis of nail 01/21/2005  . Diverticulosis of colon (without mention of hemorrhage) 06/17/2005  . Edema 11/26/2004  . GERD (gastroesophageal reflux disease)   . Hiatal hernia   . High cholesterol   . Hyperglycemia 11/18/2014  . Hypertension   . Insomnia, unspecified 10/18/2011  . Left inguinal hernia 04/24/2014  . Nocturia 09/19/2008  . Osteoporosis, senile 11/18/2014  . Other abnormal blood chemistry 04/03/2007  . Pain in joint, pelvic region and thigh 11/12/2011  . Pain in joint, shoulder region 08/15/2006  . Pain in limb 08/26/2005  . Palpitations 09/29/2009  . PAOD (peripheral arterial occlusive disease) ([place]) 11/18/2014   Right  leg; diminished popliteal, dorsalis pedis, and posterior tibial artery pulsations   . Postmenopausal atrophic vaginitis 12/14/2004  . Scoliosis   . Senile osteoporosis 12/14/2004  . Tension headache 10/04/2005  . Unspecified cataract 11/26/2004  . Unspecified  constipation 01/21/2005  . Unspecified urinary incontinence 11/26/2004  . Vaginal stenosis 11/18/2014   Tight band about 1/2 inches into the vagina which will not allow passage of my index finger.    Past Surgical History:  Procedure Laterality Date  . ABDOMINAL HYSTERECTOMY  1969   Dr.Braun   . bilateral eyelid surgery  2005   Dr.Scott  . BILATERAL OOPHORECTOMY  2002  . BREAST SURGERY     benign tumor removal, left breast 1969  . CATARACT EXTRACTION     Right  . CHOLECYSTECTOMY  1990   Dr.Moore   . COLONOSCOPY  3/18/20008   inflammation, diverticular associated colitis -Dr. Ardis Hughs  . COSMETIC SURGERY    . ESOPHAGOGASTRODUODENOSCOPY N/A 01/24/2014   Procedure: ESOPHAGOGASTRODUODENOSCOPY (EGD);  Surgeon: Irene Shipper, MD;  Location[facility] ENDOSCOPY;  Service: Endoscopy;  Laterality: N/A;  . ESOPHAGOGASTRODUODENOSCOPY ENDOSCOPY  04/04/14   Dr. Ardis Hughs  . EYE SURGERY    . FOREIGN BODY REMOVAL N/A 01/24/2014   Procedure: FOREIGN BODY REMOVAL;  Surgeon: Irene Shipper, MD;  Location: [facility];  Service: Endoscopy;  Laterality: N/A;  . HIP PINNING,CANNULATED  11/07/2011   Procedure: CANNULATED HIP PINNING;  Surgeon: Jessy Oto, MD;  Location: [facility];  Service: Orthopedics;  Laterality: Left;  . ORIF FEMORAL NECK FRACTURE W/ [facility]  11/07/2011   Dr.Nitka   . TONSILLECTOMY     Dr.Joe Tamala Julian  . TUBAL LIGATION    . VAGINAL PROLAPSE REPAIR  2002   Dr.Horback     Allergies  Allergen Reactions  . Penicillins Other (See Comments)    CHILDHOOD REACTION  . Abilify [Aripiprazole] Rash    Allergies as of 07/05/2018      Reactions   Penicillins Other (See Comments)   CHILDHOOD REACTION   Abilify [aripiprazole] Rash      Medication List       Accurate as of July 05, 2018  3:35 PM. Always use your most recent med list.        acetaminophen-codeine 300-30 MG tablet Commonly known as:  TYLENOL #3 Take 1 tablet by mouth every 6 (six) hours as needed for moderate pain.   antiseptic  oral rinse Liqd 15 mLs by Mouth Rinse route every 4 (four) hours as needed for dry mouth.   aspirin 81 MG chewable tablet Chew 81 mg by mouth daily.   BIOFREEZE EX Apply 1 application topically daily as needed.   buPROPion 150 MG 24 hr tablet Commonly known as:  WELLBUTRIN XL Take 300 mg by mouth daily.   CALTRATE 600+D 600-800 MG-UNIT Tabs Generic drug:  Calcium Carb-Cholecalciferol Take 1 tablet by mouth 2 (two) times daily.   cholecalciferol 1000 units tablet Commonly known as:  VITAMIN D Take 1,000 Units by mouth daily.   clonazePAM 0.5 MG tablet Commonly known as:  KLONOPIN Take 0.25 mg by mouth 4 (four) times daily.   docusate sodium 100 MG capsule Commonly known as:  COLACE Take 1 capsule (100 mg total) by mouth daily.   Glucosamine HCl 1000 MG Tabs Take 2,000 mg by mouth 2 (two) times daily. 2 tabs   ibandronate 150 MG tablet Commonly known as:  BONIVA Take 1 tablet (150 mg total) by mouth every 30 (thirty) days. Take in the morning  with a full glass of water, on an empty stomach, and do not take anything else by mouth or lie down for the next 30 min.   meloxicam 7.5 MG tablet Commonly known as:  MOBIC TAKE 1 TABLET BY MOUTH EVERY DAY AS NEEDED FOR ARTHRITIS   mirtazapine 30 MG tablet Commonly known as:  REMERON Take 30 mg by mouth at bedtime.   multivitamin with minerals Tabs tablet Take 1 tablet by mouth daily.   NIFEdipine 60 MG 24 hr tablet Commonly known as:  PROCARDIA XL/NIFEDICAL XL TAKE 1 TABLET BY MOUTH EVERY DAY TO CONTROL BLOOD PRESSURE   OCUSOFT EYELID CLEANSING Pads Apply 1 application topically daily.   pantoprazole 40 MG tablet Commonly known as:  PROTONIX One daily to reduce stomach acid   PEPTO-BISMOL PO Take 30 mg by mouth as needed.   polyethylene glycol packet Commonly known as:  MIRALAX / GLYCOLAX Take 17 g by mouth daily.   pravastatin 10 MG tablet Commonly known as:  PRAVACHOL Take 10 mg by mouth daily.   PRESERVISION  AREDS 2+MULTI VIT PO Take 1 tablet by mouth 2 (two) times daily.   sertraline 100 MG tablet Commonly known as:  ZOLOFT Take 150 mg by mouth daily. 1 and 1/2 tabs   SYSTANE 0.4-0.3 % Soln Generic drug:  Polyethyl Glycol-Propyl Glycol Place 1 drop into both eyes 2 (two) times daily. Can also apply  one drop to both eyes BID-PRN   TUSSIONEX PENNKINETIC ER 10-8 MG/5ML Suer Generic drug:  chlorpheniramine-HYDROcodone Take 5 mLs by mouth every 12 (twelve) hours as needed for cough.       Review of Systems  Constitutional: Negative for appetite change, chills, fatigue and fever.  HENT: Negative for congestion, postnasal drip, rhinorrhea, sinus pressure, sinus pain, sneezing, sore throat, tinnitus and trouble swallowing.   Eyes: Positive for visual disturbance. Negative for pain, discharge, redness and itching.       Wears eye glasses.dry eyes but eye drops effective.follows up with ophthalmology.   Respiratory: Negative for cough, chest tightness, shortness of breath and wheezing.   Cardiovascular: Negative for chest pain, palpitations and leg swelling.  Gastrointestinal: Negative for abdominal distention, abdominal pain, constipation, diarrhea, nausea and vomiting.  Endocrine: Negative for cold intolerance, heat intolerance, polydipsia, polyphagia and polyuria.  Genitourinary: Negative for dysuria, flank pain, frequency and urgency.  Musculoskeletal: Positive for arthralgias, back pain and gait problem. Negative for myalgias.       Working with PT states PT recommends new walker but awaiting evaluation.   Skin: Negative for color change, pallor, rash and wound.  Neurological: Negative for dizziness, weakness, light-headedness, numbness and headaches.  Psychiatric/Behavioral: Negative for agitation, confusion, sleep disturbance and suicidal ideas. The patient is not nervous/anxious.     Immunization History  Administered Date(s) Administered  . Influenza Whole 02/14/2012, 02/14/2013  .  Influenza, High Dose Seasonal PF 03/14/2017  . Influenza-Unspecified 02/27/2014, 02/12/2015, 02/25/2016, 03/15/2017, 02/19/2018  . Pneumococcal Conjugate-13 07/13/2015  . Pneumococcal Polysaccharide-23 05/16/1998  . Td 05/16/2004  . Zoster 05/16/2005  . Zoster Recombinat (Shingrix) 02/20/2018, 05/11/2018   Pertinent  Health Maintenance Due  Topic Date Due  . MAMMOGRAM  12/20/2017  . INFLUENZA VACCINE  Completed  . DEXA SCAN  Completed  . PNA vac Low Risk Adult  Completed   Fall Risk  04/25/2018 04/05/2018 04/21/2017 10/17/2016 10/14/2016  Falls in the past year? 0 0 No No No  Comment - Emmi Telephone Survey: data to providers prior to load - - -  Number falls in past yr: 0 - - - -  Injury with Fall? 0 - - - -    Vitals:   07/05/18 0927  BP: (!) 154/87  Pulse: 84  Resp: (!) 22  Temp: 98.1 F (36.7 C)  TempSrc: Oral  SpO2: 94%  Weight: 147 lb 6.4 oz (66.9 kg)  Height: 5\' 2"  (1.575 m)   Body mass index is 26.96 kg/m. Physical Exam Vitals signs and nursing note reviewed.  Constitutional:      General: She is not in acute distress.    Appearance: She is overweight. She is not ill-appearing.  HENT:     Head: Normocephalic.     Nose: Nose normal. No congestion or rhinorrhea.     Mouth/Throat:     Mouth: Mucous membranes are moist.     Pharynx: Oropharynx is clear. No oropharyngeal exudate or posterior oropharyngeal erythema.  Eyes:     General: No scleral icterus.       Right eye: No discharge.        Left eye: No discharge.     Conjunctiva/sclera: Conjunctivae normal.     Pupils: Pupils are equal, round, and reactive to light.     Comments: Corrective lens in place   Neck:     Musculoskeletal: Normal range of motion. No neck rigidity or muscular tenderness.     Vascular: No carotid bruit.  Cardiovascular:     Rate and Rhythm: Normal rate and regular rhythm.     Pulses: Normal pulses.     Heart sounds: Murmur present. No friction rub. No gallop.   Pulmonary:      Effort: Pulmonary effort is normal. No respiratory distress.     Breath sounds: Normal breath sounds. No wheezing, rhonchi or rales.  Chest:     Chest wall: No tenderness.  Abdominal:     General: Bowel sounds are normal. There is no distension.     Palpations: Abdomen is soft. There is no mass.     Tenderness: There is no abdominal tenderness. There is no right CVA tenderness, left CVA tenderness, guarding or rebound.  Musculoskeletal:        General: No swelling or tenderness.     Right lower leg: No edema.     Left lower leg: No edema.     Comments: Unsteady gait ambulates with Rolator.spinal scoliosis present.   Lymphadenopathy:     Cervical: No cervical adenopathy.  Skin:    General: Skin is warm and dry.     Coloration: Skin is not pale.     Findings: No erythema or rash.  Neurological:     Mental Status: She is alert and oriented to person, place, and time.     Cranial Nerves: No cranial nerve deficit.     Sensory: No sensory deficit.     Motor: No weakness.     Coordination: Coordination normal.     Gait: Gait abnormal.  Psychiatric:        Mood and Affect: Mood normal.        Speech: Speech normal.        Behavior: Behavior normal.        Thought Content: Thought content normal.        Judgment: Judgment normal.     Labs reviewed: Recent Labs    07/10/17 08/04/17 0000 01/18/18 0831  NA 143  143 141 139  K 4.2  4.2 4.3 4.2  CL 109 107 103  CO2 26 28 28  GLUCOSE  --  106* 86  BUN 21 19 21   CREATININE 0.9  0.89 0.91* 0.93*  CALCIUM 9.3  9.3 9.7 9.7   Recent Labs    07/10/17 08/04/17 0000 01/18/18 0831  AST 18  18 19 19   ALT 13  13 13 13   ALKPHOS 60  60  --   --   BILITOT 0.2 0.4 0.3  PROT 5.8  5.8 6.5 6.5  ALBUMIN 3.5  3.5  --   --    Recent Labs    07/10/17 08/04/17 0000  WBC 5.1  5.1 5.1  HGB 11.6*  11.6 12.8  HCT 34*  34 37.5  MCV  --  88.4  PLT 180 248   Lab Results  Component Value Date   TSH 1.84 08/04/2017   Lab Results   Component Value Date   HGBA1C 5.7 (H) 01/18/2018   Lab Results  Component Value Date   CHOL 169 08/04/2017   HDL 48 (L) 08/04/2017   LDLCALC 99 08/04/2017   TRIG 121 08/04/2017   CHOLHDL 3.5 08/04/2017    Significant Diagnostic Results in last 30 days:  Xr Shoulder Left  Result Date: 06/28/2018 Left shoulder 3 way view with glenohumeral narrowing to bone on bone appearance on the axillary lateral view and on the AP view. The SAS is narrowed at 8.6 mm . There is flattening of the humeral head and cystic changes of the right humeral head.   Xr Shoulder Right  Result Date: 06/28/2018 Right shoulder 3 way view with minimal glenohumeral narrowing no bone on bone appearance on the axillary lateral view or the AP view.  The SAS is severely narrowed at 3 mm . There is a large anterior osteophyte anterior acromion.   Assessment/Plan 1. Essential hypertension B/p reviewed.continue on Nifedipine 60 mg tablet daily.will add second antihypertensive if SBP 3 readings consistently > 160.continue on ASA and Statin for chest prophylaxis. - check CBC/diff,CMP 07/09/2018    2. Hyperlipidemia LDL goal <100 Latest LDL not at goal 106 (05/17/2018).continue on Pravastatin 10 mg tablet daily.  3. Gastroesophageal reflux disease without esophagitis Asymptomatic.continue on Protonix 40 mg tablet daily.   4. Major depression, recurrent, chronic (HCC) Mood stable.continue sertraline 150 mg tablet daily,mirtazapine 30 mg tablet at bedtime,Wellbutrin XL 300 mg tablet daily and clonazepam 0.25 mg tablet four times daily.   - check TSH level 07/09/2018.  5. Slow transit constipation Miralax 17 gm daily effective.  6. Primary osteoarthritis involving multiple joints Continue on Mobic 7.5 mg tablet daily as needed for pain.continue with exercise and swimming.   Family/ staff Communication: Reviewed plan of care with patient and facility Nurse.   Labs/tests ordered: CBC/diff,CMP and TSH level 07/09/2018     Sandrea Hughs, NP

## 2018-07-09 DIAGNOSIS — M6281 Muscle weakness (generalized): Secondary | ICD-10-CM | POA: Diagnosis not present

## 2018-07-09 DIAGNOSIS — D649 Anemia, unspecified: Secondary | ICD-10-CM | POA: Diagnosis not present

## 2018-07-09 DIAGNOSIS — R2681 Unsteadiness on feet: Secondary | ICD-10-CM | POA: Diagnosis not present

## 2018-07-09 DIAGNOSIS — E039 Hypothyroidism, unspecified: Secondary | ICD-10-CM | POA: Diagnosis not present

## 2018-07-09 DIAGNOSIS — M1990 Unspecified osteoarthritis, unspecified site: Secondary | ICD-10-CM | POA: Diagnosis not present

## 2018-07-09 LAB — TSH: TSH: 2.33 (ref 0.41–5.90)

## 2018-07-09 LAB — HEPATIC FUNCTION PANEL
ALT: 12 (ref 7–35)
AST: 18 (ref 13–35)
Alkaline Phosphatase: 60 (ref 25–125)
Bilirubin, Total: 0.4

## 2018-07-09 LAB — BASIC METABOLIC PANEL
BUN: 21 (ref 4–21)
Creatinine: 0.8 (ref 0.5–1.1)
Glucose: 99
Potassium: 4.2 (ref 3.4–5.3)
Sodium: 141 (ref 137–147)

## 2018-07-09 LAB — CBC AND DIFFERENTIAL
HCT: 40 (ref 36–46)
Hemoglobin: 13.3 (ref 12.0–16.0)
Platelets: 241 (ref 150–399)
WBC: 6.6

## 2018-07-10 DIAGNOSIS — F411 Generalized anxiety disorder: Secondary | ICD-10-CM | POA: Diagnosis not present

## 2018-07-10 DIAGNOSIS — F332 Major depressive disorder, recurrent severe without psychotic features: Secondary | ICD-10-CM | POA: Diagnosis not present

## 2018-07-11 ENCOUNTER — Telehealth (INDEPENDENT_AMBULATORY_CARE_PROVIDER_SITE_OTHER): Payer: Self-pay | Admitting: Specialist

## 2018-07-11 DIAGNOSIS — F411 Generalized anxiety disorder: Secondary | ICD-10-CM | POA: Diagnosis not present

## 2018-07-11 DIAGNOSIS — M1990 Unspecified osteoarthritis, unspecified site: Secondary | ICD-10-CM | POA: Diagnosis not present

## 2018-07-11 DIAGNOSIS — R2681 Unsteadiness on feet: Secondary | ICD-10-CM | POA: Diagnosis not present

## 2018-07-11 DIAGNOSIS — M6281 Muscle weakness (generalized): Secondary | ICD-10-CM | POA: Diagnosis not present

## 2018-07-11 NOTE — Telephone Encounter (Signed)
Belenda Cruise called who is pt physical therapist and she is requesting orders for a rollator walker  Fax # [phone]

## 2018-07-11 NOTE — Telephone Encounter (Signed)
I have faxed order for rollating walker to physical therapy

## 2018-07-16 DIAGNOSIS — R2681 Unsteadiness on feet: Secondary | ICD-10-CM | POA: Diagnosis not present

## 2018-07-16 DIAGNOSIS — M1990 Unspecified osteoarthritis, unspecified site: Secondary | ICD-10-CM | POA: Diagnosis not present

## 2018-07-16 DIAGNOSIS — M6281 Muscle weakness (generalized): Secondary | ICD-10-CM | POA: Diagnosis not present

## 2018-07-16 DIAGNOSIS — H04123 Dry eye syndrome of bilateral lacrimal glands: Secondary | ICD-10-CM | POA: Diagnosis not present

## 2018-07-16 DIAGNOSIS — H353131 Nonexudative age-related macular degeneration, bilateral, early dry stage: Secondary | ICD-10-CM | POA: Diagnosis not present

## 2018-07-16 DIAGNOSIS — H40053 Ocular hypertension, bilateral: Secondary | ICD-10-CM | POA: Diagnosis not present

## 2018-07-16 DIAGNOSIS — Z961 Presence of intraocular lens: Secondary | ICD-10-CM | POA: Diagnosis not present

## 2018-07-18 DIAGNOSIS — F411 Generalized anxiety disorder: Secondary | ICD-10-CM | POA: Diagnosis not present

## 2018-07-18 DIAGNOSIS — M1990 Unspecified osteoarthritis, unspecified site: Secondary | ICD-10-CM | POA: Diagnosis not present

## 2018-07-18 DIAGNOSIS — R2681 Unsteadiness on feet: Secondary | ICD-10-CM | POA: Diagnosis not present

## 2018-07-18 DIAGNOSIS — M6281 Muscle weakness (generalized): Secondary | ICD-10-CM | POA: Diagnosis not present

## 2018-07-23 DIAGNOSIS — R2681 Unsteadiness on feet: Secondary | ICD-10-CM | POA: Diagnosis not present

## 2018-07-23 DIAGNOSIS — M6281 Muscle weakness (generalized): Secondary | ICD-10-CM | POA: Diagnosis not present

## 2018-07-23 DIAGNOSIS — M1990 Unspecified osteoarthritis, unspecified site: Secondary | ICD-10-CM | POA: Diagnosis not present

## 2018-07-25 DIAGNOSIS — M1990 Unspecified osteoarthritis, unspecified site: Secondary | ICD-10-CM | POA: Diagnosis not present

## 2018-07-25 DIAGNOSIS — R2681 Unsteadiness on feet: Secondary | ICD-10-CM | POA: Diagnosis not present

## 2018-07-25 DIAGNOSIS — M6281 Muscle weakness (generalized): Secondary | ICD-10-CM | POA: Diagnosis not present

## 2018-07-30 DIAGNOSIS — R2681 Unsteadiness on feet: Secondary | ICD-10-CM | POA: Diagnosis not present

## 2018-07-30 DIAGNOSIS — M1990 Unspecified osteoarthritis, unspecified site: Secondary | ICD-10-CM | POA: Diagnosis not present

## 2018-07-30 DIAGNOSIS — M6281 Muscle weakness (generalized): Secondary | ICD-10-CM | POA: Diagnosis not present

## 2018-07-31 DIAGNOSIS — F411 Generalized anxiety disorder: Secondary | ICD-10-CM | POA: Diagnosis not present

## 2018-08-01 DIAGNOSIS — M6281 Muscle weakness (generalized): Secondary | ICD-10-CM | POA: Diagnosis not present

## 2018-08-01 DIAGNOSIS — R2681 Unsteadiness on feet: Secondary | ICD-10-CM | POA: Diagnosis not present

## 2018-08-01 DIAGNOSIS — M1990 Unspecified osteoarthritis, unspecified site: Secondary | ICD-10-CM | POA: Diagnosis not present

## 2018-08-06 DIAGNOSIS — M1990 Unspecified osteoarthritis, unspecified site: Secondary | ICD-10-CM | POA: Diagnosis not present

## 2018-08-06 DIAGNOSIS — M6281 Muscle weakness (generalized): Secondary | ICD-10-CM | POA: Diagnosis not present

## 2018-08-06 DIAGNOSIS — R2681 Unsteadiness on feet: Secondary | ICD-10-CM | POA: Diagnosis not present

## 2018-08-07 DIAGNOSIS — F411 Generalized anxiety disorder: Secondary | ICD-10-CM | POA: Diagnosis not present

## 2018-08-08 DIAGNOSIS — R2681 Unsteadiness on feet: Secondary | ICD-10-CM | POA: Diagnosis not present

## 2018-08-08 DIAGNOSIS — M6281 Muscle weakness (generalized): Secondary | ICD-10-CM | POA: Diagnosis not present

## 2018-08-08 DIAGNOSIS — M1990 Unspecified osteoarthritis, unspecified site: Secondary | ICD-10-CM | POA: Diagnosis not present

## 2018-08-09 DIAGNOSIS — E785 Hyperlipidemia, unspecified: Secondary | ICD-10-CM | POA: Diagnosis not present

## 2018-08-09 LAB — LIPID PANEL
Cholesterol: 191 (ref 0–200)
HDL: 45 (ref 35–70)
LDL Cholesterol: 123
Triglycerides: 120 (ref 40–160)

## 2018-08-13 DIAGNOSIS — M6281 Muscle weakness (generalized): Secondary | ICD-10-CM | POA: Diagnosis not present

## 2018-08-13 DIAGNOSIS — M1990 Unspecified osteoarthritis, unspecified site: Secondary | ICD-10-CM | POA: Diagnosis not present

## 2018-08-13 DIAGNOSIS — R2681 Unsteadiness on feet: Secondary | ICD-10-CM | POA: Diagnosis not present

## 2018-08-15 DIAGNOSIS — M6281 Muscle weakness (generalized): Secondary | ICD-10-CM | POA: Diagnosis not present

## 2018-08-15 DIAGNOSIS — M1990 Unspecified osteoarthritis, unspecified site: Secondary | ICD-10-CM | POA: Diagnosis not present

## 2018-08-15 DIAGNOSIS — F411 Generalized anxiety disorder: Secondary | ICD-10-CM | POA: Diagnosis not present

## 2018-08-16 ENCOUNTER — Other Ambulatory Visit: Payer: Self-pay

## 2018-08-16 MED ORDER — CLONAZEPAM 0.5 MG PO TABS: 0.2500 mg | ORAL_TABLET | Freq: Three times a day (TID) | ORAL | 0 refills | Status: DC | PRN

## 2018-08-17 ENCOUNTER — Other Ambulatory Visit: Payer: Self-pay

## 2018-08-17 MED ORDER — CLONAZEPAM 0.5 MG PO TABS: 0.2500 mg | ORAL_TABLET | Freq: Three times a day (TID) | ORAL | 0 refills | Status: DC | PRN

## 2018-08-17 NOTE — Telephone Encounter (Signed)
[place] when this was filled the pharmacy was not changed.Can you please fill this it should go to [facility]. I have gone in and added the pharmacy.

## 2018-08-20 DIAGNOSIS — M1990 Unspecified osteoarthritis, unspecified site: Secondary | ICD-10-CM | POA: Diagnosis not present

## 2018-08-20 DIAGNOSIS — M6281 Muscle weakness (generalized): Secondary | ICD-10-CM | POA: Diagnosis not present

## 2018-08-21 DIAGNOSIS — F411 Generalized anxiety disorder: Secondary | ICD-10-CM | POA: Diagnosis not present

## 2018-08-22 DIAGNOSIS — M1990 Unspecified osteoarthritis, unspecified site: Secondary | ICD-10-CM | POA: Diagnosis not present

## 2018-08-22 DIAGNOSIS — M6281 Muscle weakness (generalized): Secondary | ICD-10-CM | POA: Diagnosis not present

## 2018-08-27 DIAGNOSIS — M1990 Unspecified osteoarthritis, unspecified site: Secondary | ICD-10-CM | POA: Diagnosis not present

## 2018-08-27 DIAGNOSIS — M6281 Muscle weakness (generalized): Secondary | ICD-10-CM | POA: Diagnosis not present

## 2018-08-28 DIAGNOSIS — F411 Generalized anxiety disorder: Secondary | ICD-10-CM | POA: Diagnosis not present

## 2018-08-29 ENCOUNTER — Other Ambulatory Visit: Payer: Self-pay

## 2018-08-29 DIAGNOSIS — M1990 Unspecified osteoarthritis, unspecified site: Secondary | ICD-10-CM | POA: Diagnosis not present

## 2018-08-29 DIAGNOSIS — M6281 Muscle weakness (generalized): Secondary | ICD-10-CM | POA: Diagnosis not present

## 2018-08-29 MED ORDER — CLONAZEPAM 0.25 MG PO TBDP: 0.2500 mg | ORAL_TABLET | Freq: Four times a day (QID) | ORAL | 0 refills | Status: DC | PRN

## 2018-09-03 DIAGNOSIS — M1990 Unspecified osteoarthritis, unspecified site: Secondary | ICD-10-CM | POA: Diagnosis not present

## 2018-09-03 DIAGNOSIS — M6281 Muscle weakness (generalized): Secondary | ICD-10-CM | POA: Diagnosis not present

## 2018-09-05 DIAGNOSIS — M6281 Muscle weakness (generalized): Secondary | ICD-10-CM | POA: Diagnosis not present

## 2018-09-05 DIAGNOSIS — M1990 Unspecified osteoarthritis, unspecified site: Secondary | ICD-10-CM | POA: Diagnosis not present

## 2018-09-05 DIAGNOSIS — F411 Generalized anxiety disorder: Secondary | ICD-10-CM | POA: Diagnosis not present

## 2018-09-10 DIAGNOSIS — M6281 Muscle weakness (generalized): Secondary | ICD-10-CM | POA: Diagnosis not present

## 2018-09-10 DIAGNOSIS — M1990 Unspecified osteoarthritis, unspecified site: Secondary | ICD-10-CM | POA: Diagnosis not present

## 2018-09-10 DIAGNOSIS — F411 Generalized anxiety disorder: Secondary | ICD-10-CM | POA: Diagnosis not present

## 2018-09-12 DIAGNOSIS — M1990 Unspecified osteoarthritis, unspecified site: Secondary | ICD-10-CM | POA: Diagnosis not present

## 2018-09-12 DIAGNOSIS — M6281 Muscle weakness (generalized): Secondary | ICD-10-CM | POA: Diagnosis not present

## 2018-09-13 ENCOUNTER — Encounter: Payer: Self-pay | Admitting: Family

## 2018-09-13 NOTE — Progress Notes (Signed)
Opened in error. Please disregard.

## 2018-09-17 ENCOUNTER — Other Ambulatory Visit: Payer: Self-pay | Admitting: *Deleted

## 2018-09-17 DIAGNOSIS — M6281 Muscle weakness (generalized): Secondary | ICD-10-CM | POA: Diagnosis not present

## 2018-09-17 DIAGNOSIS — M1990 Unspecified osteoarthritis, unspecified site: Secondary | ICD-10-CM | POA: Diagnosis not present

## 2018-09-17 DIAGNOSIS — R2681 Unsteadiness on feet: Secondary | ICD-10-CM | POA: Diagnosis not present

## 2018-09-17 MED ORDER — CLONAZEPAM 0.25 MG PO TBDP: 0.2500 mg | ORAL_TABLET | Freq: Four times a day (QID) | ORAL | 0 refills | Status: DC | PRN

## 2018-09-17 NOTE — Telephone Encounter (Signed)
Received fax order from [facility].  Pended Rx and sent to [facility] for approval.

## 2018-09-19 DIAGNOSIS — F411 Generalized anxiety disorder: Secondary | ICD-10-CM | POA: Diagnosis not present

## 2018-09-19 DIAGNOSIS — M1990 Unspecified osteoarthritis, unspecified site: Secondary | ICD-10-CM | POA: Diagnosis not present

## 2018-09-19 DIAGNOSIS — R2681 Unsteadiness on feet: Secondary | ICD-10-CM | POA: Diagnosis not present

## 2018-09-19 DIAGNOSIS — M6281 Muscle weakness (generalized): Secondary | ICD-10-CM | POA: Diagnosis not present

## 2018-09-20 ENCOUNTER — Non-Acute Institutional Stay: Payer: Medicare Other | Admitting: Internal Medicine

## 2018-09-20 ENCOUNTER — Encounter: Payer: Self-pay | Admitting: Internal Medicine

## 2018-09-20 DIAGNOSIS — M545 Low back pain: Secondary | ICD-10-CM | POA: Diagnosis not present

## 2018-09-20 DIAGNOSIS — I10 Essential (primary) hypertension: Secondary | ICD-10-CM | POA: Diagnosis not present

## 2018-09-20 DIAGNOSIS — M810 Age-related osteoporosis without current pathological fracture: Secondary | ICD-10-CM | POA: Diagnosis not present

## 2018-09-20 DIAGNOSIS — G8929 Other chronic pain: Secondary | ICD-10-CM | POA: Diagnosis not present

## 2018-09-20 DIAGNOSIS — E785 Hyperlipidemia, unspecified: Secondary | ICD-10-CM | POA: Diagnosis not present

## 2018-09-20 DIAGNOSIS — F411 Generalized anxiety disorder: Secondary | ICD-10-CM | POA: Diagnosis not present

## 2018-09-20 DIAGNOSIS — R7989 Other specified abnormal findings of blood chemistry: Secondary | ICD-10-CM | POA: Diagnosis not present

## 2018-09-20 NOTE — Progress Notes (Signed)
Location:  [facility] Room Number: 8 Place of Service:  ALF [phone]) Provider: Veleta Miners L.MD   Virgie Dad, MD  Patient Care Team: Virgie Dad, MD as PCP - General (Internal Medicine) Melina Modena, [facility] Clent Jacks, MD as Consulting Physician (Ophthalmology) Milus Banister, MD as Consulting Physician (Gastroenterology) Norma Fredrickson, MD as Consulting Physician (Psychiatry) Jessy Oto, MD as Consulting Physician (Orthopedic Surgery) Bjorn Loser, MD as Consulting Physician (Urology)  Extended Emergency Contact Information Primary Emergency Contact: [place], [place] [address] [address] of [place] Phone: [phone] Mobile Phone: [phone] Relation: Son  Code Status:Full Code  Goals of care: Advanced Directive information Advanced Directives 09/20/2018  Does Patient Have a Medical Advance Directive? Yes  Type of Paramedic of [place];Out of facility DNR (pink MOST or yellow form);Living will  Does patient want to make changes to medical advance directive? No - Patient declined  Copy of [place] in Chart? Yes - validated most recent copy scanned in chart (See row information)  Pre-existing out of facility DNR order (yellow form or pink MOST form) Pink MOST form placed in chart (order not valid for inpatient use)     Chief Complaint  Patient presents with  . Medical Management of Chronic Issues    routine visit   . Health Maintenance    due for tetanus and mammogram     HPI:  Pt is a 85 y.o. female seen today for medical management of chronic diseases.  Patient has h/o Major Depression with Anxiety, Hyperlipidemia, Chronic Bilateral Low Back Pain, Spinal Stenosis , Osteoarthritis.  Patient was very anxious today. Her Psychiatrist has made some changes in her Meds and she was also due for BMP to check her sodium.And she was very worried about that. She did  not have any acute complains. No New Nursing issues except her Anxiety She walks with walker. No recent Falls. Appetite is good. Patient has been more nervous due to Covid. Restrictions.    Past Medical History:  Diagnosis Date  . Abnormal mammogram, unspecified 04/19/2011  . Allergy   . Anxiety   . Backache, unspecified 01/21/2005  . Cervicalgia 08/20/2013  . Chronic kidney disease, stage II (mild) 04/03/2009  . Chronic rhinitis 09/28/2010  . DDD (degenerative disc disease)   . Depression   . Dermatophytosis of nail 01/21/2005  . Diverticulosis of colon (without mention of hemorrhage) 06/17/2005  . Edema 11/26/2004  . GERD (gastroesophageal reflux disease)   . Hiatal hernia   . High cholesterol   . Hyperglycemia 11/18/2014  . Hypertension   . Insomnia, unspecified 10/18/2011  . Left inguinal hernia 04/24/2014  . Nocturia 09/19/2008  . Osteoporosis, senile 11/18/2014  . Other abnormal blood chemistry 04/03/2007  . Pain in joint, pelvic region and thigh 11/12/2011  . Pain in joint, shoulder region 08/15/2006  . Pain in limb 08/26/2005  . Palpitations 09/29/2009  . PAOD (peripheral arterial occlusive disease) (Home) 11/18/2014   Right leg; diminished popliteal, dorsalis pedis, and posterior tibial artery pulsations   . Postmenopausal atrophic vaginitis 12/14/2004  . Scoliosis   . Senile osteoporosis 12/14/2004  . Tension headache 10/04/2005  . Unspecified cataract 11/26/2004  . Unspecified constipation 01/21/2005  . Unspecified urinary incontinence 11/26/2004  . Vaginal stenosis 11/18/2014   Tight band about 1/2 inches into the vagina which will not allow passage of my index finger.    Past  Surgical History:  Procedure Laterality Date  . ABDOMINAL HYSTERECTOMY  1969   Dr.Braun   . bilateral eyelid surgery  2005   Dr.Scott  . BILATERAL OOPHORECTOMY  2002  . BREAST SURGERY     benign tumor removal, left breast 1969  . CATARACT EXTRACTION     Right  . CHOLECYSTECTOMY  1990   Dr.Moore   . COLONOSCOPY   3/18/20008   inflammation, diverticular associated colitis -Dr. Ardis Hughs  . COSMETIC SURGERY    . ESOPHAGOGASTRODUODENOSCOPY N/A 01/24/2014   Procedure: ESOPHAGOGASTRODUODENOSCOPY (EGD);  Surgeon: Irene Shipper, MD;  Location: [facility] ENDOSCOPY;  Service: Endoscopy;  Laterality: N/A;  . ESOPHAGOGASTRODUODENOSCOPY ENDOSCOPY  04/04/14   Dr. Ardis Hughs  . EYE SURGERY    . FOREIGN BODY REMOVAL N/A 01/24/2014   Procedure: FOREIGN BODY REMOVAL;  Surgeon: Irene Shipper, MD;  Location: [facility];  Service: Endoscopy;  Laterality: N/A;  . HIP PINNING,CANNULATED  11/07/2011   Procedure: CANNULATED HIP PINNING;  Surgeon: Jessy Oto, MD;  Location: [facility];  Service: Orthopedics;  Laterality: Left;  . ORIF FEMORAL NECK FRACTURE W/ [name]  11/07/2011   Dr.Nitka   . TONSILLECTOMY     Dr.Joe Tamala Julian  . TUBAL LIGATION    . VAGINAL PROLAPSE REPAIR  2002   Dr.Horback     Allergies  Allergen Reactions  . Penicillins Other (See Comments)    CHILDHOOD REACTION  . Abilify [Aripiprazole] Rash    Outpatient Encounter Medications as of 09/20/2018  Medication Sig  . acetaminophen-codeine (TYLENOL #3) 300-30 MG tablet Take 1 tablet by mouth every 6 (six) hours as needed for moderate pain.  . Acetylcysteine 600 MG CAPS Take 1 capsule by mouth daily.  Marland Kitchen antiseptic oral rinse (BIOTENE) LIQD 15 mLs by Mouth Rinse route every 4 (four) hours as needed for dry mouth.   Marland Kitchen aspirin 81 MG chewable tablet Chew 81 mg by mouth daily.  . Bismuth Subsalicylate (PEPTO-BISMOL PO) Take 30 mg by mouth as needed.  Marland Kitchen buPROPion (WELLBUTRIN XL) 150 MG 24 hr tablet Take 300 mg by mouth daily.   . Calcium Carb-Cholecalciferol (CALTRATE 600+D) 600-800 MG-UNIT TABS Take 1 tablet by mouth 2 (two) times daily.  . chlorpheniramine-HYDROcodone (TUSSIONEX PENNKINETIC ER) 10-8 MG/5ML SUER Take 5 mLs by mouth every 12 (twelve) hours as needed for cough.  . cholecalciferol (VITAMIN D) 1000 UNITS tablet Take 1,000 Units by mouth daily.  . clonazePAM  (KLONOPIN) 0.25 MG disintegrating tablet Take 1 tablet (0.25 mg total) by mouth 4 (four) times daily as needed for seizure.  . docusate sodium (COLACE) 100 MG capsule Take 1 capsule (100 mg total) by mouth daily.  . Eyelid Cleansers (OCUSOFT EYELID CLEANSING) PADS Apply 1 application topically daily.  . Glucosamine HCl 1000 MG TABS Take 2,000 mg by mouth 2 (two) times daily. 2 tabs  . ibandronate (BONIVA) 150 MG tablet Take 1 tablet (150 mg total) by mouth every 30 (thirty) days. Take in the morning with a full glass of water, on an empty stomach, and do not take anything else by mouth or lie down for the next 30 min.  . meloxicam (MOBIC) 7.5 MG tablet TAKE 1 TABLET BY MOUTH EVERY DAY AS NEEDED FOR ARTHRITIS  . Menthol, Topical Analgesic, (BIOFREEZE EX) Apply 1 application topically daily as needed.   . mirtazapine (REMERON) 45 MG tablet Take 45 mg by mouth at bedtime.  . Multiple Vitamin (MULTIVITAMIN WITH MINERALS) TABS Take 1 tablet by mouth daily.  . Multiple Vitamins-Minerals (PRESERVISION  AREDS 2+MULTI VIT PO) Take 1 tablet by mouth 2 (two) times daily.  Marland Kitchen NIFEdipine (PROCARDIA XL/ADALAT-CC) 60 MG 24 hr tablet TAKE 1 TABLET BY MOUTH EVERY DAY TO CONTROL BLOOD PRESSURE  . pantoprazole (PROTONIX) 40 MG tablet One daily to reduce stomach acid  . Polyethyl Glycol-Propyl Glycol (SYSTANE) 0.4-0.3 % SOLN Place 1 drop into both eyes 2 (two) times daily. Can also apply  one drop to both eyes BID-PRN,Apply one drop to both eyes QID  . polyethylene glycol (MIRALAX / GLYCOLAX) packet Take 17 g by mouth daily.   . pravastatin (PRAVACHOL) 10 MG tablet Take 10 mg by mouth daily.  Marland Kitchen REXULTI 1 MG TABS Take 1 mg by mouth daily.  . sertraline (ZOLOFT) 100 MG tablet Take 200 mg by mouth daily.   . [DISCONTINUED] mirtazapine (REMERON) 45 MG tablet Take 45 mg by mouth at bedtime.  . [DISCONTINUED] mirtazapine (REMERON) 30 MG tablet Take 30 mg by mouth at bedtime.   No facility-administered encounter medications  on file as of 09/20/2018.     Review of Systems  Constitutional: Negative.   HENT: Negative.   Respiratory: Negative.   Gastrointestinal: Negative.   Genitourinary: Negative.   Musculoskeletal: Positive for myalgias.  Skin: Negative.   Neurological: Negative.   Psychiatric/Behavioral: Positive for decreased concentration, dysphoric mood and sleep disturbance. The patient is nervous/anxious.     Immunization History  Administered Date(s) Administered  . Influenza Whole 02/14/2012, 02/14/2013  . Influenza, High Dose Seasonal PF 03/14/2017  . Influenza-Unspecified 02/27/2014, 02/12/2015, 02/25/2016, 03/15/2017, 02/19/2018  . Pneumococcal Conjugate-13 07/13/2015  . Pneumococcal Polysaccharide-23 05/16/1998  . Td 05/16/2004  . Zoster 05/16/2005  . Zoster Recombinat (Shingrix) 02/20/2018, 05/11/2018   Pertinent  Health Maintenance Due  Topic Date Due  . MAMMOGRAM  12/20/2017  . INFLUENZA VACCINE  12/15/2018  . DEXA SCAN  Completed  . PNA vac Low Risk Adult  Completed   Fall Risk  04/25/2018 04/05/2018 04/21/2017 10/17/2016 10/14/2016  Falls in the past year? 0 0 No No No  Comment - Emmi Telephone Survey: data to providers prior to load - - -  Number falls in past yr: 0 - - - -  Injury with Fall? 0 - - - -   Functional Status Survey:    Vitals:   09/20/18 0808  BP: (!) 150/87  Pulse: 83  Resp: 20  Temp: 98.5 F (36.9 C)  SpO2: 94%  Weight: 147 lb 6.4 oz (66.9 kg)  Height: 5\' 2"  (1.575 m)   Body mass index is 26.96 kg/m. Physical Exam Vitals signs reviewed.  HENT:     Head: Normocephalic.     Nose: Nose normal.     Mouth/Throat:     Mouth: Mucous membranes are moist.     Pharynx: Oropharynx is clear.  Eyes:     Pupils: Pupils are equal, round, and reactive to light.  Neck:     Musculoskeletal: Neck supple.  Cardiovascular:     Rate and Rhythm: Normal rate and regular rhythm.     Pulses: Normal pulses.  Pulmonary:     Effort: Pulmonary effort is normal. No  respiratory distress.     Breath sounds: Normal breath sounds. No wheezing or rales.  Abdominal:     General: Abdomen is flat. Bowel sounds are normal.     Palpations: Abdomen is soft.  Musculoskeletal:        General: Swelling present.  Skin:    General: Skin is warm and dry.  Neurological:  General: No focal deficit present.     Mental Status: She is alert and oriented to person, place, and time.  Psychiatric:     Comments: Anxious and keep repeating herself     Labs reviewed: Recent Labs    01/18/18 0831  NA 139  K 4.2  CL 103  CO2 28  GLUCOSE 86  BUN 21  CREATININE 0.93*  CALCIUM 9.7   Recent Labs    01/18/18 0831  AST 19  ALT 13  BILITOT 0.3  PROT 6.5   No results for input(s): WBC, NEUTROABS, HGB, HCT, MCV, PLT in the last 8760 hours. Lab Results  Component Value Date   TSH 1.84 08/04/2017   Lab Results  Component Value Date   HGBA1C 5.7 (H) 01/18/2018   Lab Results  Component Value Date   CHOL 186 05/17/2018   HDL 45 08/09/2018   LDLCALC 123 08/09/2018   TRIG 120 08/09/2018   CHOLHDL 3.5 08/04/2017    Significant Diagnostic Results in last 30 days:  No results found.  Assessment/Plan Essential hypertension - Plan:  On Procardia for Hypertension  GAD (generalized anxiety disorder) - Plan:  Follows with Psych They are planning to make some changes in her Meds and patient is very worried about it On Many antidepressants  Chronic bilateral low back pain without sciatica - Plan:  Seems to be stable with Mobic PRN  Hyperlipidemia LDL goal <100 - Plan:  On Prevachol  Age-related osteoporosis without current pathological fracture - Plan:  Continue [place]     Family/ staff Communication:   Labs/tests ordered:    Total time spent in this patient care encounter was  40_  minutes; greater than 50% of the visit spent counseling patient and staff, reviewing records , Labs and coordinating care for problems addressed at this encounter.

## 2018-09-21 LAB — BASIC METABOLIC PANEL
BUN: 21 (ref 4–21)
Creatinine: 0.9 (ref 0.5–1.1)
Glucose: 129
Potassium: 4.3 (ref 3.4–5.3)
Sodium: 138 (ref 137–147)

## 2018-09-24 DIAGNOSIS — M1990 Unspecified osteoarthritis, unspecified site: Secondary | ICD-10-CM | POA: Diagnosis not present

## 2018-09-24 DIAGNOSIS — M6281 Muscle weakness (generalized): Secondary | ICD-10-CM | POA: Diagnosis not present

## 2018-09-24 DIAGNOSIS — R2681 Unsteadiness on feet: Secondary | ICD-10-CM | POA: Diagnosis not present

## 2018-09-26 DIAGNOSIS — R2681 Unsteadiness on feet: Secondary | ICD-10-CM | POA: Diagnosis not present

## 2018-09-26 DIAGNOSIS — M6281 Muscle weakness (generalized): Secondary | ICD-10-CM | POA: Diagnosis not present

## 2018-09-26 DIAGNOSIS — M1990 Unspecified osteoarthritis, unspecified site: Secondary | ICD-10-CM | POA: Diagnosis not present

## 2018-09-27 ENCOUNTER — Non-Acute Institutional Stay: Payer: Medicare Other | Admitting: Family

## 2018-09-27 ENCOUNTER — Encounter: Payer: Self-pay | Admitting: Family

## 2018-09-27 DIAGNOSIS — R739 Hyperglycemia, unspecified: Secondary | ICD-10-CM | POA: Diagnosis not present

## 2018-09-27 DIAGNOSIS — R5383 Other fatigue: Secondary | ICD-10-CM

## 2018-09-27 DIAGNOSIS — E039 Hypothyroidism, unspecified: Secondary | ICD-10-CM | POA: Diagnosis not present

## 2018-09-27 DIAGNOSIS — F339 Major depressive disorder, recurrent, unspecified: Secondary | ICD-10-CM

## 2018-09-27 DIAGNOSIS — R4189 Other symptoms and signs involving cognitive functions and awareness: Secondary | ICD-10-CM

## 2018-09-27 DIAGNOSIS — D500 Iron deficiency anemia secondary to blood loss (chronic): Secondary | ICD-10-CM | POA: Diagnosis not present

## 2018-09-27 NOTE — Progress Notes (Signed)
Location:  [facility] Room Number: Bee of Service:  ALF [phone]) Provider: Damyra Luscher FNP-C  Virgie Dad, MD  Patient Care Team: Virgie Dad, MD as PCP - General (Internal Medicine) Melina Modena, [facility] Clent Jacks, MD as Consulting Physician (Ophthalmology) Milus Banister, MD as Consulting Physician (Gastroenterology) Norma Fredrickson, MD as Consulting Physician (Psychiatry) Jessy Oto, MD as Consulting Physician (Orthopedic Surgery) Bjorn Loser, MD as Consulting Physician (Urology)  Extended Emergency Contact Information Primary Emergency Contact: [address] [place], [place] [address] Johnnette Litter of [place] Phone: [phone] Mobile Phone: [phone] Relation: Son  Code Status: Full Code  Goals of care: Advanced Directive information Advanced Directives 09/27/2018  Does Patient Have a Medical Advance Directive? Yes  Type of Paramedic of [place];Living will;Out of facility DNR (pink MOST or yellow form)  Does patient want to make changes to medical advance directive? No - Patient declined  Copy of [place] in Chart? Yes - validated most recent copy scanned in chart (See row information)  Pre-existing out of facility DNR order (yellow form or pink MOST form) Pink MOST form placed in chart (order not valid for inpatient use)     Chief Complaint  Patient presents with   Acute Visit    Feeling lethargic     HPI:  Pt is a 85 y.o. female seen today at [facility] for an acute visit for evaluation of feeling of tiredness.she is seen in her room today per facility Nurse report via SBAR.Nurse supervisor present at bedside during the visit.Patient states " cannot keep attention to things but a shot period of time" seems to be more forgetful.for instance she had a music video activity prior to the visit and could not remember the presenter's name yet has been singing on  several occasion at the facility activities.patient frustrated that her memory is declining.However.her main concern today if feeling weak,tired all the time.she states does walk on the facility hallway and exercises on [facility] step.she is worried that her Psychiatry service clinical specialist is on vacations for the next week.she states has CNS cell phone number.she has been communicating over the phone due to COVID-19 restrictions.she request her iron to be checked thinks that she might be anemic. Labs reviewed Hgb 13.3 ( 07/09/2018).Her BMP electrolytes and liver function, CR at baseline.Glucose 129 (09/21/2018).   Past Medical History:  Diagnosis Date   Abnormal mammogram, unspecified 04/19/2011   Allergy    Anxiety    Backache, unspecified 01/21/2005   Cervicalgia 08/20/2013   Chronic kidney disease, stage II (mild) 04/03/2009   Chronic rhinitis 09/28/2010   DDD (degenerative disc disease)    Depression    Dermatophytosis of nail 01/21/2005   Diverticulosis of colon (without mention of hemorrhage) 06/17/2005   Edema 11/26/2004   GERD (gastroesophageal reflux disease)    Hiatal hernia    High cholesterol    Hyperglycemia 11/18/2014   Hypertension    Insomnia, unspecified 10/18/2011   Left inguinal hernia 04/24/2014   Nocturia 09/19/2008   Osteoporosis, senile 11/18/2014   Other abnormal blood chemistry 04/03/2007   Pain in joint, pelvic region and thigh 11/12/2011   Pain in joint, shoulder region 08/15/2006   Pain in limb 08/26/2005   Palpitations 09/29/2009   PAOD (peripheral arterial occlusive disease) ([place]) 11/18/2014   Right leg; diminished popliteal, dorsalis pedis, and posterior tibial artery pulsations    Postmenopausal atrophic  vaginitis 12/14/2004   Scoliosis    Senile osteoporosis 12/14/2004   Tension headache 10/04/2005   Unspecified cataract 11/26/2004   Unspecified constipation 01/21/2005   Unspecified urinary incontinence 11/26/2004   Vaginal stenosis 11/18/2014    Tight band about 1/2 inches into the vagina which will not allow passage of my index finger.    Past Surgical History:  Procedure Laterality Date   ABDOMINAL HYSTERECTOMY  1969   Dr.Braun    bilateral eyelid surgery  2005   Dr.Scott   BILATERAL OOPHORECTOMY  2002   BREAST SURGERY     benign tumor removal, left breast 1969   CATARACT EXTRACTION     Right   CHOLECYSTECTOMY  1990   Dr.Moore    COLONOSCOPY  3/18/20008   inflammation, diverticular associated colitis -Dr. Ardis Hughs   COSMETIC SURGERY     ESOPHAGOGASTRODUODENOSCOPY N/A 01/24/2014   Procedure: ESOPHAGOGASTRODUODENOSCOPY (EGD);  Surgeon: Irene Shipper, MD;  Location[facility] ENDOSCOPY;  Service: Endoscopy;  Laterality: N/A;   ESOPHAGOGASTRODUODENOSCOPY ENDOSCOPY  04/04/14   Dr. Ardis Hughs   EYE SURGERY     FOREIGN BODY REMOVAL N/A 01/24/2014   Procedure: FOREIGN BODY REMOVAL;  Surgeon: Irene Shipper, MD;  Location: [address];  Service: Endoscopy;  Laterality: N/A;   HIP PINNING,CANNULATED  11/07/2011   Procedure: CANNULATED HIP PINNING;  Surgeon: Jessy Oto, MD;  Location: [facility];  Service: Orthopedics;  Laterality: Left;   ORIF FEMORAL NECK FRACTURE W/ DHS  11/07/2011   Dr.Nitka    TONSILLECTOMY     Dr.Joe Tamala Julian   TUBAL LIGATION     VAGINAL PROLAPSE REPAIR  2002   Dr.Horback     Allergies  Allergen Reactions   Penicillins Other (See Comments)    CHILDHOOD REACTION   Abilify [Aripiprazole] Rash    Outpatient Encounter Medications as of 09/27/2018  Medication Sig   acetaminophen-codeine (TYLENOL #3) 300-30 MG tablet Take 1 tablet by mouth every 6 (six) hours as needed for moderate pain.   Acetylcysteine 600 MG CAPS Take 1 capsule by mouth daily.   antiseptic oral rinse (BIOTENE) LIQD 15 mLs by Mouth Rinse route every 4 (four) hours as needed for dry mouth. Resident may self administer and keep in room   aspirin 81 MG chewable tablet Chew 81 mg by mouth daily.   Bismuth Subsalicylate (PEPTO-BISMOL  PO) Take 30 mg by mouth as needed. For diarrhea   buPROPion (WELLBUTRIN XL) 150 MG 24 hr tablet Take 300 mg by mouth daily.    Calcium Carb-Cholecalciferol (CALTRATE 600+D) 600-800 MG-UNIT TABS Take 1 tablet by mouth 2 (two) times daily.   chlorpheniramine-HYDROcodone (TUSSIONEX PENNKINETIC ER) 10-8 MG/5ML SUER Take 5 mLs by mouth every 12 (twelve) hours as needed for cough.   cholecalciferol (VITAMIN D) 1000 UNITS tablet Take 1,000 Units by mouth daily.   clonazePAM (KLONOPIN) 0.25 MG disintegrating tablet Take 1 tablet (0.25 mg total) by mouth 4 (four) times daily as needed for seizure.   docusate sodium (COLACE) 100 MG capsule Take 1 capsule (100 mg total) by mouth daily.   Eyelid Cleansers (OCUSOFT EYELID CLEANSING) PADS Apply 1 application topically daily.   Glucosamine HCl 1000 MG TABS Take 2,000 mg by mouth 2 (two) times daily. 2 tabs   ibandronate (BONIVA) 150 MG tablet Take 1 tablet (150 mg total) by mouth every 30 (thirty) days. Take in the morning with a full glass of water, on an empty stomach, and do not take anything else by mouth or lie down for the  next 30 min.   meloxicam (MOBIC) 7.5 MG tablet TAKE 1 TABLET BY MOUTH EVERY DAY AS NEEDED FOR ARTHRITIS   Menthol, Topical Analgesic, (BIOFREEZE) 4 % GEL Apply 1 application topically as needed. Resident may self administer and keep in room   mirtazapine (REMERON) 45 MG tablet Take 45 mg by mouth at bedtime.   Multiple Vitamin (MULTIVITAMIN WITH MINERALS) TABS Take 1 tablet by mouth daily.   Multiple Vitamins-Minerals (PRESERVISION AREDS 2+MULTI VIT PO) Take 1 tablet by mouth 2 (two) times daily.   NIFEdipine (PROCARDIA XL/ADALAT-CC) 60 MG 24 hr tablet TAKE 1 TABLET BY MOUTH EVERY DAY TO CONTROL BLOOD PRESSURE   pantoprazole (PROTONIX) 40 MG tablet One daily to reduce stomach acid   Polyethyl Glycol-Propyl Glycol (SYSTANE) 0.4-0.3 % SOLN Place 1 drop into both eyes 2 (two) times daily. Can also apply  one drop to both eyes  BID-PRN,Apply one drop to both eyes QID   polyethylene glycol (MIRALAX / GLYCOLAX) packet Take 17 g by mouth daily.    pravastatin (PRAVACHOL) 10 MG tablet Take 10 mg by mouth daily.   REXULTI 1 MG TABS Take 1 mg by mouth daily.   sertraline (ZOLOFT) 100 MG tablet Take 200 mg by mouth daily.    [DISCONTINUED] Menthol, Topical Analgesic, (BIOFREEZE EX) Apply 1 application topically daily as needed. Resident may self administer and keep in room   No facility-administered encounter medications on file as of 09/27/2018.     Review of Systems  Constitutional: Positive for fatigue. Negative for activity change, chills and fever.       States has poor appetite though adds doesn't like the food orders small portions.   HENT: Negative for congestion, rhinorrhea, sinus pressure, sinus pain, sneezing and sore throat.   Eyes: Positive for visual disturbance. Negative for pain, discharge, redness and itching.       Dry eye has eye drops.wears eye glasses  Respiratory: Negative for cough, chest tightness, shortness of breath and wheezing.   Cardiovascular: Negative for chest pain, palpitations and leg swelling.  Gastrointestinal: Negative for abdominal distention, abdominal pain, constipation, diarrhea, nausea and vomiting.  Endocrine: Negative for cold intolerance, heat intolerance, polydipsia, polyphagia and polyuria.  Genitourinary: Negative for difficulty urinating, dysuria, flank pain, frequency and urgency.  Musculoskeletal: Positive for gait problem.  Skin: Negative for color change, pallor and rash.  Neurological: Negative for dizziness and light-headedness.  Psychiatric/Behavioral: Positive for sleep disturbance. Negative for agitation, behavioral problems and suicidal ideas. The patient is nervous/anxious.     Immunization History  Administered Date(s) Administered   Influenza Whole 02/14/2012, 02/14/2013   Influenza, High Dose Seasonal PF 03/14/2017   Influenza-Unspecified  02/27/2014, 02/12/2015, 02/25/2016, 03/15/2017, 02/19/2018   Pneumococcal Conjugate-13 07/13/2015   Pneumococcal Polysaccharide-23 05/16/1998   Td 05/16/2004   Zoster 05/16/2005   Zoster Recombinat (Shingrix) 02/20/2018, 05/11/2018   Pertinent  Health Maintenance Due  Topic Date Due   MAMMOGRAM  12/20/2017   INFLUENZA VACCINE  12/15/2018   DEXA SCAN  Completed   PNA vac Low Risk Adult  Completed   Fall Risk  04/25/2018 04/05/2018 04/21/2017 10/17/2016 10/14/2016  Falls in the past year? 0 0 No No No  Comment - Emmi Telephone Survey: data to providers prior to load - - -  Number falls in past yr: 0 - - - -  Injury with Fall? 0 - - - -   Functional Status Survey:    Vitals:   09/27/18 1100  BP: 128/70  Pulse: 84  Resp:  16  Temp: 97.9 F (36.6 C)  TempSrc: Oral  SpO2: 96%  Weight: 147 lb 6.4 oz (66.9 kg)  Height: 5\' 2"  (1.575 m)   Body mass index is 26.96 kg/m. Physical Exam Vitals signs and nursing note reviewed.  Constitutional:      General: She is not in acute distress.    Appearance: She is overweight. She is not ill-appearing.  HENT:     Head: Normocephalic.     Right Ear: Tympanic membrane, ear canal and external ear normal. There is no impacted cerumen.     Left Ear: Tympanic membrane, ear canal and external ear normal. There is no impacted cerumen.     Nose: Nose normal. No congestion or rhinorrhea.     Mouth/Throat:     Mouth: Mucous membranes are moist.     Pharynx: Oropharynx is clear. No oropharyngeal exudate or posterior oropharyngeal erythema.  Eyes:     General: No scleral icterus.       Right eye: No discharge.        Left eye: No discharge.     Extraocular Movements: Extraocular movements intact.     Conjunctiva/sclera: Conjunctivae normal.     Pupils: Pupils are equal, round, and reactive to light.     Comments: Corrective lens in place   Neck:     Musculoskeletal: Normal range of motion. No neck rigidity or muscular tenderness.    Cardiovascular:     Rate and Rhythm: Normal rate and regular rhythm.     Pulses: Normal pulses.     Heart sounds: Normal heart sounds. No murmur. No friction rub. No gallop.   Pulmonary:     Effort: Pulmonary effort is normal. No respiratory distress.     Breath sounds: Normal breath sounds. No wheezing, rhonchi or rales.  Chest:     Chest wall: No tenderness.  Abdominal:     General: Bowel sounds are normal. There is no distension.     Palpations: Abdomen is soft. There is no mass.     Tenderness: There is no abdominal tenderness. There is no right CVA tenderness, left CVA tenderness, guarding or rebound.  Musculoskeletal:        General: No tenderness.     Right lower leg: No edema.     Left lower leg: No edema.     Comments: Unsteady gait walks with walker.scoliosis present   Lymphadenopathy:     Cervical: No cervical adenopathy.  Skin:    General: Skin is warm and dry.     Capillary Refill: Capillary refill takes 2 to 3 seconds.     Coloration: Skin is not pale.     Findings: No bruising, erythema or rash.  Neurological:     Mental Status: She is alert and oriented to person, place, and time.     Cranial Nerves: No cranial nerve deficit.     Sensory: No sensory deficit.     Motor: No weakness.     Coordination: Coordination normal.     Gait: Gait abnormal.  Psychiatric:        Mood and Affect: Mood is anxious.        Speech: Speech normal.        Behavior: Behavior is cooperative.        Thought Content: Thought content normal.        Cognition and Memory: Cognition is impaired. Memory is impaired.        Judgment: Judgment normal.    Labs reviewed: Recent  Labs    01/18/18 0831 07/09/18 09/21/18  NA 139 141 138  K 4.2 4.2 4.3  CL 103  --   --   CO2 28  --   --   GLUCOSE 86  --   --   BUN 21 21 21   CREATININE 0.93* 0.8 0.9  CALCIUM 9.7  --   --    Recent Labs    01/18/18 0831 07/09/18  AST 19 18  ALT 13 12  ALKPHOS  --  60  BILITOT 0.3  --   PROT 6.5   --    Recent Labs    07/09/18  WBC 6.6  HGB 13.3  HCT 40  PLT 241   Lab Results  Component Value Date   TSH 2.33 07/09/2018   Lab Results  Component Value Date   HGBA1C 5.7 (H) 01/18/2018   Lab Results  Component Value Date   CHOL 191 08/09/2018   HDL 45 08/09/2018   LDLCALC 123 08/09/2018   TRIG 120 08/09/2018   CHOLHDL 3.5 08/04/2017    Significant Diagnostic Results in last 30 days:  No results found.  Assessment/Plan 1. Fatigue, unspecified type Afebrile.reports feelings of tiredness though this could be due to her depression and also related to her multiple medication.recent hgb within normal range but request iron panel to be done.recent glucose level slightly high. - Hgb A1C  - serum ferritin,iron,TIBC   2. Major depression, recurrent, chronic (HCC) Worried that her therapist is on vacation but does have therapist cell phone to call.patient reassured.will continue on Wellbutrin 300 mg daily,Remeron 45 mg tablet daily,Zoloft 150 mg tablet daily,rexulti 1 mg tablet daily and clonazepam 0.25 mg tablet four times daily as needed.Continue to monitor mood.    3. Cognitive change Stressed that she has become more forgetful though expected due to her advance age.will obtain MMSE to screen for cognitive impairment.  4. Hyperglycemia Recent Glucose slightly elevated.will rule out Type 2 DM.  - check Hgb A1C   Family/ staff Communication: Reviewed plan of care with patient and facility Nurse supervisor  Labs/tests ordered: Hgb A1C and serum ferritin,iron,TIBC today.    Sandrea Hughs, NP

## 2018-09-28 LAB — TSH: TSH: 1.42 (ref 0.41–5.90)

## 2018-09-28 LAB — IRON,TIBC AND FERRITIN PANEL
Ferritin: 107
Iron: 80

## 2018-09-28 LAB — HEMOGLOBIN A1C: Hemoglobin A1C: 5.5

## 2018-10-01 ENCOUNTER — Other Ambulatory Visit: Payer: Self-pay | Admitting: *Deleted

## 2018-10-01 DIAGNOSIS — M6281 Muscle weakness (generalized): Secondary | ICD-10-CM | POA: Diagnosis not present

## 2018-10-01 DIAGNOSIS — F411 Generalized anxiety disorder: Secondary | ICD-10-CM | POA: Diagnosis not present

## 2018-10-01 DIAGNOSIS — R2681 Unsteadiness on feet: Secondary | ICD-10-CM | POA: Diagnosis not present

## 2018-10-01 DIAGNOSIS — M1990 Unspecified osteoarthritis, unspecified site: Secondary | ICD-10-CM | POA: Diagnosis not present

## 2018-10-01 MED ORDER — CLONAZEPAM 0.25 MG PO TBDP: 0.2500 mg | ORAL_TABLET | Freq: Four times a day (QID) | ORAL | 0 refills | Status: DC | PRN

## 2018-10-01 NOTE — Telephone Encounter (Signed)
[facility] fax order Pended Rx and sent to [facility] for approval.

## 2018-10-02 DIAGNOSIS — F331 Major depressive disorder, recurrent, moderate: Secondary | ICD-10-CM | POA: Diagnosis not present

## 2018-10-02 DIAGNOSIS — F411 Generalized anxiety disorder: Secondary | ICD-10-CM | POA: Diagnosis not present

## 2018-10-05 ENCOUNTER — Encounter: Payer: Self-pay | Admitting: Nurse Practitioner

## 2018-10-05 ENCOUNTER — Non-Acute Institutional Stay: Payer: Medicare Other | Admitting: Nurse Practitioner

## 2018-10-05 DIAGNOSIS — Z79899 Other long term (current) drug therapy: Secondary | ICD-10-CM | POA: Diagnosis not present

## 2018-10-05 DIAGNOSIS — H538 Other visual disturbances: Secondary | ICD-10-CM | POA: Insufficient documentation

## 2018-10-05 DIAGNOSIS — R739 Hyperglycemia, unspecified: Secondary | ICD-10-CM | POA: Diagnosis not present

## 2018-10-05 DIAGNOSIS — F339 Major depressive disorder, recurrent, unspecified: Secondary | ICD-10-CM

## 2018-10-05 DIAGNOSIS — R42 Dizziness and giddiness: Secondary | ICD-10-CM

## 2018-10-05 NOTE — Assessment & Plan Note (Signed)
Blood pressure is controlled, continue Nifedipine 60mg  qd.

## 2018-10-05 NOTE — Assessment & Plan Note (Addendum)
The plan of care discussed with [facility]: Will hold Cymbalta 30mg  qd, decrease Sertraline 100mg  qd-goal is to taper it off since it has little efficacy, continue Wellbutrin 150mg  qd, Mirtazapine 30mg  qd, Clonazepam 0.25mg  qid prn. Observe.

## 2018-10-05 NOTE — Progress Notes (Addendum)
Location:   [facility] Room Number: 8/A Place of Service:  ALF (13) Provider: Lennie Odor Ming Kunka NP  Virgie Dad, MD  Patient Care Team: Virgie Dad, MD as PCP - General (Internal Medicine) Melina Modena, [facility] Clent Jacks, MD as Consulting Physician (Ophthalmology) Milus Banister, MD as Consulting Physician (Gastroenterology) Norma Fredrickson, MD as Consulting Physician (Psychiatry) Jessy Oto, MD as Consulting Physician (Orthopedic Surgery) Bjorn Loser, MD as Consulting Physician (Urology)  Extended Emergency Contact Information Primary Emergency Contact: Le, [address] [place], [place] [address] Johnnette Litter of [place] Phone: [phone] Mobile Phone: [phone] Relation: Son  Code Status:  DNR Goals of care: Advanced Directive information Advanced Directives 10/05/2018  Does Patient Have a Medical Advance Directive? Yes  Type of Paramedic of [place];Living will  Does patient want to make changes to medical advance directive? No - Patient declined  Copy of [place] in Chart? Yes - validated most recent copy scanned in chart (See row information)  Pre-existing out of facility DNR order (yellow form or pink MOST form) Pink MOST form placed in chart (order not valid for inpatient use)     Chief Complaint  Patient presents with  . Acute Visit    Dizziness, blured vision    HPI:  Pt is a 85 y.o. female seen today for an acute visit for worsened chronic dizziness, blurred vision x 1-2 days. she denied headache, chest pain/pressure, palpitation, nausea, vomiting, constipation, or diarrhea, she denied dysuria or urinary urgency. She is afebrile. No focal neurological symptoms noted. Hx of depression/anxiety, on Xexulti 1mg  qd. Cymbalta 30mg  qd(new), Sertraline 150mg  qd(decreased from 200mg  qd), Wellbutrin 150mg  qd, Mirtazapine 30mg  qd(decreased from 45mg  qd), Clonazepam 0.25mg  qid prn. Hx of HTN,  blood pressure is controlled on Nifedipine/Adalat 60mg .    Past Medical History:  Diagnosis Date  . Abnormal mammogram, unspecified 04/19/2011  . Allergy   . Anxiety   . Backache, unspecified 01/21/2005  . Cervicalgia 08/20/2013  . Chronic kidney disease, stage II (mild) 04/03/2009  . Chronic rhinitis 09/28/2010  . DDD (degenerative disc disease)   . Depression   . Dermatophytosis of nail 01/21/2005  . Diverticulosis of colon (without mention of hemorrhage) 06/17/2005  . Edema 11/26/2004  . GERD (gastroesophageal reflux disease)   . Hiatal hernia   . High cholesterol   . Hyperglycemia 11/18/2014  . Hypertension   . Insomnia, unspecified 10/18/2011  . Left inguinal hernia 04/24/2014  . Nocturia 09/19/2008  . Osteoporosis, senile 11/18/2014  . Other abnormal blood chemistry 04/03/2007  . Pain in joint, pelvic region and thigh 11/12/2011  . Pain in joint, shoulder region 08/15/2006  . Pain in limb 08/26/2005  . Palpitations 09/29/2009  . PAOD (peripheral arterial occlusive disease) ([place]) 11/18/2014   Right leg; diminished popliteal, dorsalis pedis, and posterior tibial artery pulsations   . Postmenopausal atrophic vaginitis 12/14/2004  . Scoliosis   . Senile osteoporosis 12/14/2004  . Tension headache 10/04/2005  . Unspecified cataract 11/26/2004  . Unspecified constipation 01/21/2005  . Unspecified urinary incontinence 11/26/2004  . Vaginal stenosis 11/18/2014   Tight band about 1/2 inches into the vagina which will not allow passage of my index finger.    Past Surgical History:  Procedure Laterality Date  . ABDOMINAL HYSTERECTOMY  1969   Dr.Braun   . bilateral eyelid surgery  2005   Dr.Scott  . BILATERAL OOPHORECTOMY  2002  . BREAST SURGERY  benign tumor removal, left breast 1969  . CATARACT EXTRACTION     Right  . CHOLECYSTECTOMY  1990   Dr.Moore   . COLONOSCOPY  3/18/20008   inflammation, diverticular associated colitis -Dr. Ardis Hughs  . COSMETIC SURGERY    . ESOPHAGOGASTRODUODENOSCOPY N/A  01/24/2014   Procedure: ESOPHAGOGASTRODUODENOSCOPY (EGD);  Surgeon: Irene Shipper, MD;  Location: [facility] ENDOSCOPY;  Service: Endoscopy;  Laterality: N/A;  . ESOPHAGOGASTRODUODENOSCOPY ENDOSCOPY  04/04/14   Dr. Ardis Hughs  . EYE SURGERY    . FOREIGN BODY REMOVAL N/A 01/24/2014   Procedure: FOREIGN BODY REMOVAL;  Surgeon: Irene Shipper, MD;  Location: [facility];  Service: Endoscopy;  Laterality: N/A;  . HIP PINNING,CANNULATED  11/07/2011   Procedure: CANNULATED HIP PINNING;  Surgeon: Jessy Oto, MD;  Location: [facility];  Service: Orthopedics;  Laterality: Left;  . ORIF FEMORAL NECK FRACTURE W/ [facility]  11/07/2011   Dr.Nitka   . TONSILLECTOMY     Dr.Joe Tamala Julian  . TUBAL LIGATION    . VAGINAL PROLAPSE REPAIR  2002   Dr.Horback     Allergies  Allergen Reactions  . Penicillins Other (See Comments)    CHILDHOOD REACTION  . Abilify [Aripiprazole] Rash    Allergies as of 10/05/2018      Reactions   Penicillins Other (See Comments)   CHILDHOOD REACTION   Abilify [aripiprazole] Rash      Medication List       Accurate as of Oct 05, 2018  1:20 PM. If you have any questions, ask your nurse or doctor.        acetaminophen-codeine 300-30 MG tablet Commonly known as:  TYLENOL #3 Take 1 tablet by mouth every 6 (six) hours as needed for moderate pain.   Acetylcysteine 600 MG Caps Take 1 capsule by mouth daily.   antiseptic oral rinse Liqd 15 mLs by Mouth Rinse route every 4 (four) hours as needed for dry mouth. Resident may self administer and keep in room   aspirin 81 MG chewable tablet Chew 81 mg by mouth daily.   Biofreeze 4 % Gel Generic drug:  Menthol (Topical Analgesic) Apply 1 application topically as needed. Resident may self administer and keep in room   buPROPion 150 MG 24 hr tablet Commonly known as:  WELLBUTRIN XL Take 300 mg by mouth daily.   Caltrate 600+D 600-800 MG-UNIT Tabs Generic drug:  Calcium Carb-Cholecalciferol Take 1 tablet by mouth 2 (two) times daily.    cholecalciferol 1000 units tablet Commonly known as:  VITAMIN D Take 1,000 Units by mouth daily.   clonazePAM 0.25 MG disintegrating tablet Commonly known as:  KLONOPIN Take 1 tablet (0.25 mg total) by mouth 4 (four) times daily as needed for seizure. What changed:    when to take this  additional instructions   docusate sodium 100 MG capsule Commonly known as:  Colace Take 1 capsule (100 mg total) by mouth daily.   DULoxetine 30 MG capsule Commonly known as:  CYMBALTA Take 30 mg by mouth every morning.   Glucosamine HCl 1000 MG Tabs Take 2,000 mg by mouth 2 (two) times daily. 2 tabs   ibandronate 150 MG tablet Commonly known as:  BONIVA Take 1 tablet (150 mg total) by mouth every 30 (thirty) days. Take in the morning with a full glass of water, on an empty stomach, and do not take anything else by mouth or lie down for the next 30 min.   meloxicam 7.5 MG tablet Commonly known as:  MOBIC TAKE 1  TABLET BY MOUTH EVERY DAY AS NEEDED FOR ARTHRITIS   mirtazapine 30 MG tablet Commonly known as:  REMERON Take 30 mg by mouth daily. What changed:  Another medication with the same name was removed. Continue taking this medication, and follow the directions you see here. Changed by:  Aliena Ghrist X Marisue Canion, NP   multivitamin with minerals Tabs tablet Take 1 tablet by mouth daily.   NIFEdipine 60 MG 24 hr tablet Commonly known as:  PROCARDIA XL/NIFEDICAL XL TAKE 1 TABLET BY MOUTH EVERY DAY TO CONTROL BLOOD PRESSURE   OcuSoft Eyelid Cleansing Pads Apply 1 application topically daily.   pantoprazole 40 MG tablet Commonly known as:  PROTONIX One daily to reduce stomach acid   PEPTO-BISMOL PO Take 30 mg by mouth as needed. For diarrhea   polyethylene glycol 17 g packet Commonly known as:  MIRALAX / GLYCOLAX Take 17 g by mouth daily.   pravastatin 10 MG tablet Commonly known as:  PRAVACHOL Take 10 mg by mouth daily.   PRESERVISION AREDS 2+MULTI VIT PO Take 1 tablet by mouth 2  (two) times daily.   Rexulti 1 MG Tabs Generic drug:  Brexpiprazole Take 1 mg by mouth daily.   sertraline 100 MG tablet Commonly known as:  ZOLOFT Take 150 mg by mouth every morning.   Systane 0.4-0.3 % Soln Generic drug:  Polyethyl Glycol-Propyl Glycol Place 1 drop into both eyes 2 (two) times daily. Can also apply  one drop to both eyes BID-PRN,Apply one drop to both eyes QID   Tussionex Pennkinetic ER 10-8 MG/5ML Suer Generic drug:  chlorpheniramine-HYDROcodone Take 5 mLs by mouth every 12 (twelve) hours as needed for cough.       Review of Systems  Constitutional: Positive for fatigue. Negative for activity change, appetite change, chills, diaphoresis and fever.  HENT: Positive for hearing loss. Negative for congestion and voice change.   Eyes: Positive for visual disturbance.       Blurry vision  Respiratory: Negative for cough, shortness of breath and wheezing.   Cardiovascular: Negative for chest pain, palpitations and leg swelling.  Gastrointestinal: Negative for abdominal distention, abdominal pain, constipation, diarrhea, nausea and vomiting.  Genitourinary: Negative for difficulty urinating, dysuria and urgency.  Musculoskeletal: Positive for gait problem.  Skin: Negative for color change and pallor.  Neurological: Positive for dizziness, tremors and light-headedness. Negative for seizures, syncope, speech difficulty, weakness and headaches.       Memory lapses. Fine tremors in fingers.   Psychiatric/Behavioral: Negative for agitation, behavioral problems, confusion, hallucinations and sleep disturbance. The patient is nervous/anxious.     Immunization History  Administered Date(s) Administered  . Influenza Whole 02/14/2012, 02/14/2013  . Influenza, High Dose Seasonal PF 03/14/2017  . Influenza-Unspecified 02/27/2014, 02/12/2015, 02/25/2016, 03/15/2017, 02/19/2018  . Pneumococcal Conjugate-13 07/13/2015  . Pneumococcal Polysaccharide-23 05/16/1998  . Td  05/16/2004  . Zoster 05/16/2005  . Zoster Recombinat (Shingrix) 02/20/2018, 05/11/2018   Pertinent  Health Maintenance Due  Topic Date Due  . MAMMOGRAM  12/20/2017  . INFLUENZA VACCINE  12/15/2018  . DEXA SCAN  Completed  . PNA vac Low Risk Adult  Completed   Fall Risk  04/25/2018 04/05/2018 04/21/2017 10/17/2016 10/14/2016  Falls in the past year? 0 0 No No No  Comment - Emmi Telephone Survey: data to providers prior to load - - -  Number falls in past yr: 0 - - - -  Injury with Fall? 0 - - - -   Functional Status Survey:    Vitals:  10/05/18 1234  BP: 116/70  Pulse: 80  Resp: 20  Temp: 98.2 F (36.8 C)  SpO2: 92%  Weight: 147 lb 6.4 oz (66.9 kg)  Height: 5\' 2"  (1.575 m)   Body mass index is 26.96 kg/m. Physical Exam Vitals signs and nursing note reviewed.  Constitutional:      General: She is not in acute distress.    Appearance: Normal appearance. She is not toxic-appearing.  HENT:     Head: Normocephalic and atraumatic.     Nose: Nose normal. No congestion or rhinorrhea.     Mouth/Throat:     Mouth: Mucous membranes are moist.     Pharynx: Oropharynx is clear.  Eyes:     General: Vision grossly intact. Gaze aligned appropriately. No visual field deficit or scleral icterus.       Right eye: No discharge.        Left eye: No discharge.     Extraocular Movements: Extraocular movements intact.     Right eye: Normal extraocular motion and no nystagmus.     Left eye: Normal extraocular motion and no nystagmus.     Conjunctiva/sclera: Conjunctivae normal.     Pupils: Pupils are equal, round, and reactive to light.  Neck:     Musculoskeletal: Normal range of motion and neck supple.  Cardiovascular:     Rate and Rhythm: Normal rate and regular rhythm.     Heart sounds: Murmur present.  Pulmonary:     Effort: Pulmonary effort is normal.     Breath sounds: No wheezing, rhonchi or rales.  Abdominal:     General: There is no distension.     Palpations: Abdomen is  soft.     Tenderness: There is no abdominal tenderness. There is no right CVA tenderness, left CVA tenderness, guarding or rebound.  Musculoskeletal:     Right lower leg: No edema.     Left lower leg: No edema.     Comments: Scoliosis, ambulates with walker.   Skin:    General: Skin is warm and dry.  Neurological:     General: No focal deficit present.     Mental Status: She is alert and oriented to person, place, and time. Mental status is at baseline.     Cranial Nerves: No cranial nerve deficit, dysarthria or facial asymmetry.     Motor: Tremor present. No weakness, abnormal muscle tone or seizure activity.     Coordination: Coordination normal.     Gait: Gait abnormal.     Deep Tendon Reflexes: Reflexes normal.  Psychiatric:        Behavior: Behavior normal.        Thought Content: Thought content normal.        Judgment: Judgment normal.     Comments: Appears anxious.      Labs reviewed: Recent Labs    01/18/18 0831 07/09/18 09/21/18  NA 139 141 138  K 4.2 4.2 4.3  CL 103  --   --   CO2 28  --   --   GLUCOSE 86  --   --   BUN 21 21 21   CREATININE 0.93* 0.8 0.9  CALCIUM 9.7  --   --    Recent Labs    01/18/18 0831 07/09/18  AST 19 18  ALT 13 12  ALKPHOS  --  60  BILITOT 0.3  --   PROT 6.5  --    Recent Labs    07/09/18  WBC 6.6  HGB 13.3  HCT 40  PLT 241   Lab Results  Component Value Date   TSH 1.42 09/28/2018   Lab Results  Component Value Date   HGBA1C 5.5 09/28/2018   Lab Results  Component Value Date   CHOL 191 08/09/2018   HDL 45 08/09/2018   LDLCALC 123 08/09/2018   TRIG 120 08/09/2018   CHOLHDL 3.5 08/04/2017    Significant Diagnostic Results in last 30 days:  No results found.  Assessment/Plan Dizziness 10/05/18 on Cymbalta 30mg  qd, Sertraline 150mg  qd, Wellbutrin 150mg  qd, Mirtazapine 30mg  qd, Clonazepam 0.25mg  qid prn. C/o worsened dizziness and blurred vision, she denied headache, chest pain/pressure, palpitation, nausea,  vomiting, constipation, or diarrhea, she denied dysuria or urinary urgency. She is afebrile. No focal neurological symptoms noted.  Plan of treatment was discussed with [employer], the patient, and charge nurse: CBC/diff, CMP stat, VS and neuro check qshift x72hours, hold Cymbalta, discourage Clonazepam use, decrease Sertraline to 100mg  qd. Observe.    Major depression, recurrent, chronic (HCC) The plan of care discussed with [facility]: Will hold Cymbalta 30mg  qd, decrease Sertraline 100mg  qd-goal is to taper it off since it has little efficacy, continue Wellbutrin 150mg  qd, Mirtazapine 30mg  qd, Clonazepam 0.25mg  qid prn. Observe.   Hypertension Blood pressure is controlled, continue Nifedipine 60mg  qd.   Blurred vision Has recent ophthalmology evaluation, she denied eye pain, change of vision acuity. She believes her burry vision is worse since her Cymbalta started, will hold Cymbalta, decrease Sertraline, observe.     Family/ staff Communication: plan of care reviewed with the patient, [employer], and charge nurse.   Labs/tests ordered: CBC/diff, CMP stat  Time spend 40 minutes.

## 2018-10-05 NOTE — Assessment & Plan Note (Signed)
Has recent ophthalmology evaluation, she denied eye pain, change of vision acuity. She believes her burry vision is worse since her Cymbalta started, will hold Cymbalta, decrease Sertraline, observe.

## 2018-10-05 NOTE — Assessment & Plan Note (Addendum)
10/05/18 on Cymbalta 30mg  qd, Sertraline 150mg  qd, Wellbutrin 150mg  qd, Mirtazapine 30mg  qd, Clonazepam 0.25mg  qid prn. C/o worsened dizziness and blurred vision, she denied headache, chest pain/pressure, palpitation, nausea, vomiting, constipation, or diarrhea, she denied dysuria or urinary urgency. She is afebrile. No focal neurological symptoms noted.  Plan of treatment was discussed with [employer], the patient, and charge nurse: CBC/diff, CMP stat, VS and neuro check qshift x72hours, hold Cymbalta, discourage Clonazepam use, decrease Sertraline to 100mg  qd. Observe.

## 2018-10-08 DIAGNOSIS — F419 Anxiety disorder, unspecified: Secondary | ICD-10-CM | POA: Diagnosis not present

## 2018-10-08 DIAGNOSIS — Z79899 Other long term (current) drug therapy: Secondary | ICD-10-CM | POA: Diagnosis not present

## 2018-10-08 DIAGNOSIS — M419 Scoliosis, unspecified: Secondary | ICD-10-CM | POA: Diagnosis not present

## 2018-10-08 DIAGNOSIS — R6889 Other general symptoms and signs: Secondary | ICD-10-CM | POA: Diagnosis not present

## 2018-10-08 LAB — CBC AND DIFFERENTIAL
HCT: 40 (ref 36–46)
Hemoglobin: 13.2 (ref 12.0–16.0)
Platelets: 245 (ref 150–399)

## 2018-10-15 DIAGNOSIS — F411 Generalized anxiety disorder: Secondary | ICD-10-CM | POA: Diagnosis not present

## 2018-10-23 ENCOUNTER — Other Ambulatory Visit: Payer: Self-pay | Admitting: *Deleted

## 2018-10-23 DIAGNOSIS — F331 Major depressive disorder, recurrent, moderate: Secondary | ICD-10-CM | POA: Diagnosis not present

## 2018-10-23 DIAGNOSIS — F411 Generalized anxiety disorder: Secondary | ICD-10-CM | POA: Diagnosis not present

## 2018-10-23 MED ORDER — CLONAZEPAM 0.25 MG PO TBDP: 0.2500 mg | ORAL_TABLET | Freq: Four times a day (QID) | ORAL | 0 refills | Status: DC | PRN

## 2018-10-23 NOTE — Telephone Encounter (Signed)
Received fax from [facility] requesting refill to be sent to [facility] and sent to [facility] for approval.

## 2018-11-01 DIAGNOSIS — F411 Generalized anxiety disorder: Secondary | ICD-10-CM | POA: Diagnosis not present

## 2018-11-05 ENCOUNTER — Encounter: Payer: Self-pay | Admitting: Internal Medicine

## 2018-11-05 NOTE — Progress Notes (Signed)
A user error has taken place.

## 2018-11-08 DIAGNOSIS — F411 Generalized anxiety disorder: Secondary | ICD-10-CM | POA: Diagnosis not present

## 2018-11-08 DIAGNOSIS — E785 Hyperlipidemia, unspecified: Secondary | ICD-10-CM | POA: Diagnosis not present

## 2018-11-08 LAB — LIPID PANEL
Cholesterol: 150 (ref 0–200)
HDL: 47 (ref 35–70)
LDL Cholesterol: 84
Triglycerides: 99 (ref 40–160)

## 2018-11-10 DIAGNOSIS — Z03818 Encounter for observation for suspected exposure to other biological agents ruled out: Secondary | ICD-10-CM | POA: Diagnosis not present

## 2018-11-12 LAB — NOVEL CORONAVIRUS, NAA: SARS-CoV-2, NAA: NOT DETECTED

## 2018-11-16 DIAGNOSIS — F411 Generalized anxiety disorder: Secondary | ICD-10-CM | POA: Diagnosis not present

## 2018-11-19 ENCOUNTER — Encounter: Payer: Self-pay | Admitting: Internal Medicine

## 2018-11-19 ENCOUNTER — Non-Acute Institutional Stay: Payer: Medicare Other | Admitting: Internal Medicine

## 2018-11-19 DIAGNOSIS — T7840XD Allergy, unspecified, subsequent encounter: Secondary | ICD-10-CM

## 2018-11-19 DIAGNOSIS — R220 Localized swelling, mass and lump, head: Secondary | ICD-10-CM | POA: Diagnosis not present

## 2018-11-19 NOTE — Progress Notes (Signed)
Location:  [facility] Room Number: 8/A Place of Service:  ALF [phone]) Provider:Cathlyn Tersigni L,MD   Virgie Dad, MD  Patient Care Team: Virgie Dad, MD as PCP - General (Internal Medicine) Melina Modena, [facility] Clent Jacks, MD as Consulting Physician (Ophthalmology) Milus Banister, MD as Consulting Physician (Gastroenterology) Norma Fredrickson, MD as Consulting Physician (Psychiatry) Jessy Oto, MD as Consulting Physician (Orthopedic Surgery) Bjorn Loser, MD as Consulting Physician (Urology)  Extended Emergency Contact Information Primary Emergency Contact: [address] [place], [place] [address] Johnnette Litter of [place] Phone: [phone] Mobile Phone: [phone] Relation: Son  Code Status: Full Code  Goals of care: Advanced Directive information Advanced Directives 11/19/2018  Does Patient Have a Medical Advance Directive? Yes  Type of Paramedic of [place];Living will;Out of facility DNR (pink MOST or yellow form)  Does patient want to make changes to medical advance directive? No - Patient declined  Copy of [place] in Chart? Yes - validated most recent copy scanned in chart (See row information)  Pre-existing out of facility DNR order (yellow form or pink MOST form) Yellow form placed in chart (order not valid for inpatient use);Pink MOST form placed in chart (order not valid for inpatient use)     Chief Complaint  Patient presents with   Acute Visit    Swelling lip     HPI:  Pt is a 85 y.o. female seen today for an acute visit for Swelling of her Lips  She has h/o Bipolar Disorder and Sees outside Psychiatrist They had started her on Lamictal few weeks ago. Patient was doing well mood wise but few days ago noticed swelling in her Lips. Her Lamictal was discontinued and she was started on Claritin. She got better over the weekend but then this morning she thought she had  swelling again. When I went to see her swelling was almost resolved. Patient is worried that it is something else which is causing her Reaction No SOB or wheezing noticed   Past Medical History:  Diagnosis Date   Abnormal mammogram, unspecified 04/19/2011   Allergy    Anxiety    Backache, unspecified 01/21/2005   Cervicalgia 08/20/2013   Chronic kidney disease, stage II (mild) 04/03/2009   Chronic rhinitis 09/28/2010   DDD (degenerative disc disease)    Depression    Dermatophytosis of nail 01/21/2005   Diverticulosis of colon (without mention of hemorrhage) 06/17/2005   Edema 11/26/2004   GERD (gastroesophageal reflux disease)    Hiatal hernia    High cholesterol    Hyperglycemia 11/18/2014   Hypertension    Insomnia, unspecified 10/18/2011   Left inguinal hernia 04/24/2014   Nocturia 09/19/2008   Osteoporosis, senile 11/18/2014   Other abnormal blood chemistry 04/03/2007   Pain in joint, pelvic region and thigh 11/12/2011   Pain in joint, shoulder region 08/15/2006   Pain in limb 08/26/2005   Palpitations 09/29/2009   PAOD (peripheral arterial occlusive disease) ([place]) 11/18/2014   Right leg; diminished popliteal, dorsalis pedis, and posterior tibial artery pulsations    Postmenopausal atrophic vaginitis 12/14/2004   Scoliosis    Senile osteoporosis 12/14/2004   Tension headache 10/04/2005   Unspecified cataract 11/26/2004   Unspecified constipation 01/21/2005   Unspecified urinary incontinence 11/26/2004   Vaginal stenosis 11/18/2014   Tight band about 1/2 inches into the vagina which will not allow passage of my index finger.  Past Surgical History:  Procedure Laterality Date   ABDOMINAL HYSTERECTOMY  1969   Dr.Braun    bilateral eyelid surgery  2005   Dr.Scott   BILATERAL OOPHORECTOMY  2002   BREAST SURGERY     benign tumor removal, left breast 1969   CATARACT EXTRACTION     Right   CHOLECYSTECTOMY  1990   Dr.Moore    COLONOSCOPY  3/18/20008    inflammation, diverticular associated colitis -Dr. Ardis Hughs   COSMETIC SURGERY     ESOPHAGOGASTRODUODENOSCOPY N/A 01/24/2014   Procedure: ESOPHAGOGASTRODUODENOSCOPY (EGD);  Surgeon: Irene Shipper, MD;  Location: [facility] ENDOSCOPY;  Service: Endoscopy;  Laterality: N/A;   ESOPHAGOGASTRODUODENOSCOPY ENDOSCOPY  04/04/14   Dr. Ardis Hughs   EYE SURGERY     FOREIGN BODY REMOVAL N/A 01/24/2014   Procedure: FOREIGN BODY REMOVAL;  Surgeon: Irene Shipper, MD;  Location: [facility];  Service: Endoscopy;  Laterality: N/A;   HIP PINNING,CANNULATED  11/07/2011   Procedure: CANNULATED HIP PINNING;  Surgeon: Jessy Oto, MD;  Location: [facility];  Service: Orthopedics;  Laterality: Left;   ORIF FEMORAL NECK FRACTURE W/ [facility]  11/07/2011   Dr.Nitka    TONSILLECTOMY     Dr.Joe Tamala Julian   TUBAL LIGATION     VAGINAL PROLAPSE REPAIR  2002   Dr.Horback     Allergies  Allergen Reactions   Penicillins Other (See Comments)    CHILDHOOD REACTION   Abilify [Aripiprazole] Rash    Outpatient Encounter Medications as of 11/19/2018  Medication Sig   acetaminophen-codeine (TYLENOL #3) 300-30 MG tablet Take 1 tablet by mouth every 6 (six) hours as needed for moderate pain.   Acetylcysteine 600 MG CAPS Take 1 capsule by mouth daily.   antiseptic oral rinse (BIOTENE) LIQD 15 mLs by Mouth Rinse route every 4 (four) hours as needed for dry mouth. Resident may self administer and keep in room   aspirin 81 MG chewable tablet Chew 81 mg by mouth daily.   Bismuth Subsalicylate (PEPTO-BISMOL PO) Take 30 mg by mouth as needed. For diarrhea   buPROPion (WELLBUTRIN XL) 150 MG 24 hr tablet Take 300 mg by mouth daily.    Calcium Carb-Cholecalciferol (CALTRATE 600+D) 600-800 MG-UNIT TABS Take 1 tablet by mouth 2 (two) times daily.   chlorpheniramine-HYDROcodone (TUSSIONEX PENNKINETIC ER) 10-8 MG/5ML SUER Take 5 mLs by mouth every 12 (twelve) hours as needed for cough.   cholecalciferol (VITAMIN D) 1000 UNITS tablet Take 1,000  Units by mouth daily.   clonazePAM (KLONOPIN) 0.25 MG disintegrating tablet Take 1 tablet (0.25 mg total) by mouth 4 (four) times daily as needed for seizure.   docusate sodium (COLACE) 100 MG capsule Take 1 capsule (100 mg total) by mouth daily.   DULoxetine (CYMBALTA) 30 MG capsule Take 30 mg by mouth every morning.    Eyelid Cleansers (OCUSOFT EYELID CLEANSING) PADS Apply 1 application topically daily.   Glucosamine HCl 1000 MG TABS Take 2,000 mg by mouth 2 (two) times daily. 2 tabs   ibandronate (BONIVA) 150 MG tablet Take 1 tablet (150 mg total) by mouth every 30 (thirty) days. Take in the morning with a full glass of water, on an empty stomach, and do not take anything else by mouth or lie down for the next 30 min.   meloxicam (MOBIC) 7.5 MG tablet TAKE 1 TABLET BY MOUTH EVERY DAY AS NEEDED FOR ARTHRITIS   Menthol, Topical Analgesic, (BIOFREEZE) 4 % GEL Apply 1 application topically as needed. Resident may self administer and keep in room  mirtazapine (REMERON) 30 MG tablet Take 30 mg by mouth daily.   Multiple Vitamin (MULTIVITAMIN WITH MINERALS) TABS Take 1 tablet by mouth daily.   Multiple Vitamins-Minerals (PRESERVISION AREDS 2+MULTI VIT PO) Take 1 tablet by mouth 2 (two) times daily.   NIFEdipine (PROCARDIA XL/ADALAT-CC) 60 MG 24 hr tablet TAKE 1 TABLET BY MOUTH EVERY DAY TO CONTROL BLOOD PRESSURE   pantoprazole (PROTONIX) 40 MG tablet One daily to reduce stomach acid   Polyethyl Glycol-Propyl Glycol (SYSTANE) 0.4-0.3 % SOLN Place 1 drop into both eyes. Can also apply  one drop to both eyes BID-PRN,Apply one drop to both eyes QID   polyethylene glycol (MIRALAX / GLYCOLAX) packet Take 17 g by mouth daily.    pravastatin (PRAVACHOL) 10 MG tablet Take 10 mg by mouth daily.   [DISCONTINUED] lamoTRIgine (LAMICTAL) 25 MG tablet Take 25 mg by mouth daily. Take 50mg  at bedtime daily   No facility-administered encounter medications on file as of 11/19/2018.     Review of  Systems  Constitutional: Negative.   HENT: Negative for congestion and drooling.        Lip swelling  Respiratory: Negative for cough, choking, chest tightness and shortness of breath.   All other systems reviewed and are negative.   Immunization History  Administered Date(s) Administered   Influenza Whole 02/14/2012, 02/14/2013   Influenza, High Dose Seasonal PF 03/14/2017   Influenza-Unspecified 02/27/2014, 02/12/2015, 02/25/2016, 03/15/2017, 02/19/2018   Pneumococcal Conjugate-13 07/13/2015   Pneumococcal Polysaccharide-23 05/16/1998   Td 05/16/2004   Zoster 05/16/2005   Zoster Recombinat (Shingrix) 02/20/2018, 05/11/2018   Pertinent  Health Maintenance Due  Topic Date Due   URINE MICROALBUMIN  07/27/1935   MAMMOGRAM  12/20/2017   INFLUENZA VACCINE  12/15/2018   DEXA SCAN  Completed   PNA vac Low Risk Adult  Completed   Fall Risk  04/25/2018 04/05/2018 04/21/2017 10/17/2016 10/14/2016  Falls in the past year? 0 0 No No No  Comment - Emmi Telephone Survey: data to providers prior to load - - -  Number falls in past yr: 0 - - - -  Injury with Fall? 0 - - - -   Functional Status Survey:    Vitals:   11/19/18 1417  BP: 112/64  Pulse: 62  Resp: 17  Temp: 98.4 F (36.9 C)  SpO2: 96%  Weight: 142 lb 6.4 oz (64.6 kg)  Height: 5\' 2"  (1.575 m)   Body mass index is 26.05 kg/m. Physical Exam Vitals signs reviewed.  Constitutional:      Appearance: She is normal weight.  HENT:     Head: Normocephalic.     Nose: Nose normal.     Mouth/Throat:     Mouth: Mucous membranes are moist.     Pharynx: Oropharynx is clear.     Comments: No swelling Noticed Eyes:     Pupils: Pupils are equal, round, and reactive to light.  Neck:     Musculoskeletal: Neck supple.  Cardiovascular:     Rate and Rhythm: Normal rate and regular rhythm.     Pulses: Normal pulses.     Heart sounds: Normal heart sounds.  Pulmonary:     Effort: Pulmonary effort is normal. No respiratory  distress.     Breath sounds: Normal breath sounds. No wheezing or rales.  Abdominal:     General: Abdomen is flat. Bowel sounds are normal.     Palpations: Abdomen is soft.  Skin:    General: Skin is warm and dry.  Neurological:  General: No focal deficit present.     Mental Status: She is alert and oriented to person, place, and time.  Psychiatric:        Mood and Affect: Mood normal.        Thought Content: Thought content normal.     Labs reviewed: Recent Labs    01/18/18 0831 07/09/18 09/21/18  NA 139 141 138  K 4.2 4.2 4.3  CL 103  --   --   CO2 28  --   --   GLUCOSE 86  --   --   BUN 21 21 21   CREATININE 0.93* 0.8 0.9  CALCIUM 9.7  --   --    Recent Labs    01/18/18 0831 07/09/18  AST 19 18  ALT 13 12  ALKPHOS  --  60  BILITOT 0.3  --   PROT 6.5  --    Recent Labs    07/09/18 10/08/18  WBC 6.6  --   HGB 13.3 13.2  HCT 40 40  PLT 241 245   Lab Results  Component Value Date   TSH 1.42 09/28/2018   Lab Results  Component Value Date   HGBA1C 5.5 09/28/2018   Lab Results  Component Value Date   CHOL 150 11/08/2018   HDL 47 11/08/2018   LDLCALC 84 11/08/2018   TRIG 99 11/08/2018   CHOLHDL 3.5 08/04/2017    Significant Diagnostic Results in last 30 days:  No results found.  Assessment/Plan Allergic reaction with Swelling Lips Most likely due to Lamictal I think her reaction was due to Lamictal. She has not had exposure to anything new in her diet. Will continue on Clairitin for few days Reval if swelling comes back Reassured Patient    Family/ staff Communication:   Labs/tests ordered:   Total time spent in this patient care encounter was  25_  minutes; greater than 50% of the visit spent counseling patient and staff, reviewing records , Labs and coordinating care for problems addressed at this encounter.

## 2018-11-20 DIAGNOSIS — F411 Generalized anxiety disorder: Secondary | ICD-10-CM | POA: Diagnosis not present

## 2018-11-28 DIAGNOSIS — F411 Generalized anxiety disorder: Secondary | ICD-10-CM | POA: Diagnosis not present

## 2018-12-03 ENCOUNTER — Non-Acute Institutional Stay: Payer: Medicare Other | Admitting: Internal Medicine

## 2018-12-03 ENCOUNTER — Other Ambulatory Visit: Payer: Self-pay | Admitting: *Deleted

## 2018-12-03 ENCOUNTER — Encounter: Payer: Self-pay | Admitting: Internal Medicine

## 2018-12-03 DIAGNOSIS — I10 Essential (primary) hypertension: Secondary | ICD-10-CM | POA: Diagnosis not present

## 2018-12-03 DIAGNOSIS — F339 Major depressive disorder, recurrent, unspecified: Secondary | ICD-10-CM

## 2018-12-03 MED ORDER — CLONAZEPAM 0.25 MG PO TBDP: 0.2500 mg | ORAL_TABLET | Freq: Four times a day (QID) | ORAL | 0 refills | Status: DC | PRN

## 2018-12-03 NOTE — Telephone Encounter (Signed)
Received fax from [facility] Requesting refill Pended and sent to Dr. Lyndel Safe for approval.

## 2018-12-03 NOTE — Progress Notes (Signed)
Location:  [facility] Room Number: 8 Place of Service:  ALF ([phone]) Provider:Gupta Rene Kocher, MD   Virgie Dad, MD  Patient Care Team: Virgie Dad, MD as PCP - General (Internal Medicine) Melina Modena, [facility] Clent Jacks, MD as Consulting Physician (Ophthalmology) Milus Banister, MD as Consulting Physician (Gastroenterology) Norma Fredrickson, MD as Consulting Physician (Psychiatry) Jessy Oto, MD as Consulting Physician (Orthopedic Surgery) Bjorn Loser, MD as Consulting Physician (Urology)  Extended Emergency Contact Information Primary Emergency Contact: [address] [place], [place] [address] Johnnette Litter of [place] Phone: [phone] Mobile Phone: [phone] Relation: Son  Code Status: DNR Goals of care: Advanced Directive information Advanced Directives 12/03/2018  Does Patient Have a Medical Advance Directive? Yes  Type of Paramedic of [place];Living will;Out of facility DNR (pink MOST or yellow form)  Does patient want to make changes to medical advance directive? No - Patient declined  Copy of [place] in Chart? Yes - validated most recent copy scanned in chart (See row information)  Pre-existing out of facility DNR order (yellow form or pink MOST form) Yellow form placed in chart (order not valid for inpatient use);Pink MOST form placed in chart (order not valid for inpatient use)     Chief Complaint  Patient presents with  . Acute Visit    Hypertension    HPI:  Pt is a 85 y.o. female seen today for an acute visit for Hypertension  Patient has h/o Major Depression with Anxiety, Hyperlipidemia, Chronic Bilateral Low Back Pain, Spinal Stenosis , Osteoarthritis  For last few weeks patient had blood pressure checked which were more than 150/90. Patient gets very anxious and wanted to know if she needed the dose changed for her medicines. Patient is on Procardia 60 mg  for last 20 years.  She was worried that the pain medicine is not effective anymore.  Her blood pressure today was 168/96.  But repeated by the nurse later was 140/90.  Patient denies any chest pain, shortness of breath or headache  Past Medical History:  Diagnosis Date  . Abnormal mammogram, unspecified 04/19/2011  . Allergy   . Anxiety   . Backache, unspecified 01/21/2005  . Cervicalgia 08/20/2013  . Chronic kidney disease, stage II (mild) 04/03/2009  . Chronic rhinitis 09/28/2010  . DDD (degenerative disc disease)   . Depression   . Dermatophytosis of nail 01/21/2005  . Diverticulosis of colon (without mention of hemorrhage) 06/17/2005  . Edema 11/26/2004  . GERD (gastroesophageal reflux disease)   . Hiatal hernia   . High cholesterol   . Hyperglycemia 11/18/2014  . Hypertension   . Insomnia, unspecified 10/18/2011  . Left inguinal hernia 04/24/2014  . Nocturia 09/19/2008  . Osteoporosis, senile 11/18/2014  . Other abnormal blood chemistry 04/03/2007  . Pain in joint, pelvic region and thigh 11/12/2011  . Pain in joint, shoulder region 08/15/2006  . Pain in limb 08/26/2005  . Palpitations 09/29/2009  . PAOD (peripheral arterial occlusive disease) ([place]) 11/18/2014   Right leg; diminished popliteal, dorsalis pedis, and posterior tibial artery pulsations   . Postmenopausal atrophic vaginitis 12/14/2004  . Scoliosis   . Senile osteoporosis 12/14/2004  . Tension headache 10/04/2005  . Unspecified cataract 11/26/2004  . Unspecified constipation 01/21/2005  . Unspecified urinary incontinence 11/26/2004  . Vaginal stenosis 11/18/2014   Tight band about 1/2 inches into the vagina which will not allow passage of my index  finger.    Past Surgical History:  Procedure Laterality Date  . ABDOMINAL HYSTERECTOMY  1969   Dr.Braun   . bilateral eyelid surgery  2005   Dr.Scott  . BILATERAL OOPHORECTOMY  2002  . BREAST SURGERY     benign tumor removal, left breast 1969  . CATARACT EXTRACTION     Right  .  CHOLECYSTECTOMY  1990   Dr.Moore   . COLONOSCOPY  3/18/20008   inflammation, diverticular associated colitis -Dr. Ardis Hughs  . COSMETIC SURGERY    . ESOPHAGOGASTRODUODENOSCOPY N/A 01/24/2014   Procedure: ESOPHAGOGASTRODUODENOSCOPY (EGD);  Surgeon: Irene Shipper, MD;  Location: [facility] ENDOSCOPY;  Service: Endoscopy;  Laterality: N/A;  . ESOPHAGOGASTRODUODENOSCOPY ENDOSCOPY  04/04/14   Dr. Ardis Hughs  . EYE SURGERY    . FOREIGN BODY REMOVAL N/A 01/24/2014   Procedure: FOREIGN BODY REMOVAL;  Surgeon: Irene Shipper, MD;  Location: [facility];  Service: Endoscopy;  Laterality: N/A;  . HIP PINNING,CANNULATED  11/07/2011   Procedure: CANNULATED HIP PINNING;  Surgeon: Jessy Oto, MD;  Location: [facility];  Service: Orthopedics;  Laterality: Left;  . ORIF FEMORAL NECK FRACTURE W/ [facility]  11/07/2011   Dr.Nitka   . TONSILLECTOMY     Dr.Joe Tamala Julian  . TUBAL LIGATION    . VAGINAL PROLAPSE REPAIR  2002   Dr.Horback     Allergies  Allergen Reactions  . Lamictal [Lamotrigine] Swelling    Lip swelling  . Penicillins Other (See Comments)    CHILDHOOD REACTION  . Abilify [Aripiprazole] Rash    Outpatient Encounter Medications as of 12/03/2018  Medication Sig  . acetaminophen-codeine (TYLENOL #3) 300-30 MG tablet Take 1 tablet by mouth every 6 (six) hours as needed for moderate pain.  . Acetylcysteine 600 MG CAPS Take 1 capsule by mouth daily.  Marland Kitchen antiseptic oral rinse (BIOTENE) LIQD 15 mLs by Mouth Rinse route every 4 (four) hours as needed for dry mouth. Resident may self administer and keep in room  . aspirin 81 MG chewable tablet Chew 81 mg by mouth daily.  . Bismuth Subsalicylate (PEPTO-BISMOL PO) Take 30 mg by mouth as needed. For diarrhea  . buPROPion (WELLBUTRIN XL) 150 MG 24 hr tablet Take 300 mg by mouth daily.   . Calcium Carb-Cholecalciferol (CALTRATE 600+D) 600-800 MG-UNIT TABS Take 1 tablet by mouth 2 (two) times daily.  . chlorpheniramine-HYDROcodone (TUSSIONEX PENNKINETIC ER) 10-8 MG/5ML SUER Take 5  mLs by mouth every 12 (twelve) hours as needed for cough.  . cholecalciferol (VITAMIN D) 1000 UNITS tablet Take 1,000 Units by mouth daily.  . citalopram (CELEXA) 20 MG tablet Take 20 mg by mouth at bedtime.  . clonazePAM (KLONOPIN) 0.25 MG disintegrating tablet Take 1 tablet (0.25 mg total) by mouth 4 (four) times daily as needed for seizure.  . docusate sodium (COLACE) 100 MG capsule Take 1 capsule (100 mg total) by mouth daily.  . DULoxetine (CYMBALTA) 30 MG capsule Take 30 mg by mouth every morning.   . Eyelid Cleansers (OCUSOFT EYELID CLEANSING) PADS Apply 1 application topically daily.  . Glucosamine HCl 1000 MG TABS Take 2,000 mg by mouth 2 (two) times daily. 2 tabs  . ibandronate (BONIVA) 150 MG tablet Take 1 tablet (150 mg total) by mouth every 30 (thirty) days. Take in the morning with a full glass of water, on an empty stomach, and do not take anything else by mouth or lie down for the next 30 min.  . meloxicam (MOBIC) 7.5 MG tablet TAKE 1 TABLET BY MOUTH EVERY  DAY AS NEEDED FOR ARTHRITIS  . Menthol, Topical Analgesic, (BIOFREEZE) 4 % GEL Apply 1 application topically as needed. Resident may self administer and keep in room  . mirtazapine (REMERON) 30 MG tablet Take 30 mg by mouth daily.  . Multiple Vitamin (MULTIVITAMIN WITH MINERALS) TABS Take 1 tablet by mouth daily.  . Multiple Vitamins-Minerals (PRESERVISION AREDS 2+MULTI VIT PO) Take 1 tablet by mouth 2 (two) times daily.  Marland Kitchen NIFEdipine (PROCARDIA XL/ADALAT-CC) 60 MG 24 hr tablet TAKE 1 TABLET BY MOUTH EVERY DAY TO CONTROL BLOOD PRESSURE  . pantoprazole (PROTONIX) 40 MG tablet One daily to reduce stomach acid  . Polyethyl Glycol-Propyl Glycol (SYSTANE) 0.4-0.3 % SOLN Place 1 drop into both eyes. Can also apply  one drop to both eyes BID-PRN,Apply one drop to both eyes QID  . polyethylene glycol (MIRALAX / GLYCOLAX) packet Take 17 g by mouth daily.   . pravastatin (PRAVACHOL) 10 MG tablet Take 10 mg by mouth daily.   No  facility-administered encounter medications on file as of 12/03/2018.     Review of Systems  Constitutional: Negative.   HENT: Negative.   Respiratory: Negative.   Cardiovascular: Negative.   Neurological: Positive for weakness.  Psychiatric/Behavioral: The patient is nervous/anxious.     Immunization History  Administered Date(s) Administered  . Influenza Whole 02/14/2012, 02/14/2013  . Influenza, High Dose Seasonal PF 03/14/2017  . Influenza-Unspecified 02/27/2014, 02/12/2015, 02/25/2016, 03/15/2017, 02/19/2018  . Pneumococcal Conjugate-13 07/13/2015  . Pneumococcal Polysaccharide-23 05/16/1998  . Td 05/16/2004  . Zoster 05/16/2005  . Zoster Recombinat (Shingrix) 02/20/2018, 05/11/2018   Pertinent  Health Maintenance Due  Topic Date Due  . URINE MICROALBUMIN  07/27/1935  . MAMMOGRAM  12/20/2017  . INFLUENZA VACCINE  12/15/2018  . DEXA SCAN  Completed  . PNA vac Low Risk Adult  Completed   Fall Risk  04/25/2018 04/05/2018 04/21/2017 10/17/2016 10/14/2016  Falls in the past year? 0 0 No No No  Comment - Emmi Telephone Survey: data to providers prior to load - - -  Number falls in past yr: 0 - - - -  Injury with Fall? 0 - - - -   Functional Status Survey:    Vitals:   12/03/18 1517  BP: (!) 168/96  Pulse: 78  Resp: 19  Temp: 98.6 F (37 C)  SpO2: 98%  Weight: 142 lb 6.4 oz (64.6 kg)  Height: 5\' 2"  (1.575 m)   Body mass index is 26.05 kg/m. Physical Exam  Constitutional: Oriented to person, place, and time. Well-developed and well-nourished.  HENT:  Head: Normocephalic.  Mouth/Throat: Oropharynx is clear and moist.  Eyes: Pupils are equal, round, and reactive to light.  Neck: Neck supple.  Cardiovascular: Normal rate and normal heart sounds.  Positive for murmur heard. Pulmonary/Chest: Effort normal and breath sounds normal. No respiratory distress. No wheezes. She has no rales.  Abdominal: Soft. Bowel sounds are normal. No distension. There is no tenderness.  There is no rebound.  Musculoskeletal: No edema.  Lymphadenopathy: none Neurological: Alert and oriented to person, place, and time. Walks with Walker Skin: Skin is warm and dry.  Psychiatric: Anxious about her BP   Labs reviewed: Recent Labs    01/18/18 0831 07/09/18 09/21/18  NA 139 141 138  K 4.2 4.2 4.3  CL 103  --   --   CO2 28  --   --   GLUCOSE 86  --   --   BUN 21 21 21   CREATININE 0.93* 0.8 0.9  CALCIUM 9.7  --   --    Recent Labs    01/18/18 0831 07/09/18  AST 19 18  ALT 13 12  ALKPHOS  --  60  BILITOT 0.3  --   PROT 6.5  --    Recent Labs    07/09/18 10/08/18  WBC 6.6  --   HGB 13.3 13.2  HCT 40 40  PLT 241 245   Lab Results  Component Value Date   TSH 1.42 09/28/2018   Lab Results  Component Value Date   HGBA1C 5.5 09/28/2018   Lab Results  Component Value Date   CHOL 150 11/08/2018   HDL 47 11/08/2018   LDLCALC 84 11/08/2018   TRIG 99 11/08/2018   CHOLHDL 3.5 08/04/2017    Significant Diagnostic Results in last 30 days:  No results found.  Assessment/Plan Essential Hypertension D/w Patient and Nurse incharge Will Continue Procardia XL for now Check BP QD for 2 weeks and Reval   Family/ staff Communication:   Labs/tests ordered:   Total time spent in this patient care encounter was  25_  minutes; greater than 50% of the visit spent counseling patient and staff, reviewing records , Labs and coordinating care for problems addressed at this encounter.

## 2018-12-06 DIAGNOSIS — F411 Generalized anxiety disorder: Secondary | ICD-10-CM | POA: Diagnosis not present

## 2018-12-07 ENCOUNTER — Encounter: Payer: Self-pay | Admitting: Family

## 2018-12-07 ENCOUNTER — Non-Acute Institutional Stay: Payer: Medicare Other | Admitting: Family

## 2018-12-07 DIAGNOSIS — M545 Low back pain, unspecified: Secondary | ICD-10-CM

## 2018-12-07 DIAGNOSIS — I10 Essential (primary) hypertension: Secondary | ICD-10-CM | POA: Diagnosis not present

## 2018-12-07 DIAGNOSIS — F339 Major depressive disorder, recurrent, unspecified: Secondary | ICD-10-CM

## 2018-12-07 DIAGNOSIS — E785 Hyperlipidemia, unspecified: Secondary | ICD-10-CM

## 2018-12-07 DIAGNOSIS — G8929 Other chronic pain: Secondary | ICD-10-CM

## 2018-12-07 DIAGNOSIS — F411 Generalized anxiety disorder: Secondary | ICD-10-CM

## 2018-12-07 DIAGNOSIS — K219 Gastro-esophageal reflux disease without esophagitis: Secondary | ICD-10-CM | POA: Diagnosis not present

## 2018-12-07 NOTE — Progress Notes (Signed)
Location:  [facility] [place] [place] Room Number: 8 Place of Service:  ALF [phone]) Provider: Tyrail Grandfield FNP-C   Virgie Dad, MD  Patient Care Team: Virgie Dad, MD as PCP - General (Internal Medicine) Melina Modena, [facility] Clent Jacks, MD as Consulting Physician (Ophthalmology) Milus Banister, MD as Consulting Physician (Gastroenterology) Norma Fredrickson, MD as Consulting Physician (Psychiatry) Jessy Oto, MD as Consulting Physician (Orthopedic Surgery) Bjorn Loser, MD as Consulting Physician (Urology)  Extended Emergency Contact Information Primary Emergency Contact: [place], [place] [address] [address] of [place] Phone: [phone] Mobile Phone: [phone] Relation: Son  Code Status:Full Code  Goals of care: Advanced Directive information Advanced Directives 12/07/2018  Does Patient Have a Medical Advance Directive? Yes  Type of Paramedic of [place];Living will  Does patient want to make changes to medical advance directive? No - Patient declined  Copy of [place] in Chart? Yes - validated most recent copy scanned in chart (See row information)  Pre-existing out of facility DNR order (yellow form or pink MOST form) -     Chief Complaint  Patient presents with   Medical Management of Chronic Issues    Routine Visit    HPI:  Pt is a 85 y.o. female seen today [place] for medical management of chronic diseases. She is seen in her room sitting on her recliner.she states doing much better with depression.she continues to work with physical Therapist,does own exercises daily in the room and also uses Nu-step.Also walks three times on facility hallways.she was given a Journal by therapy which she says has really helped her whenever she writes down her Blessings.Facility Nurse states her mood has been good.No crying.she has had no recent fall episode or weight  changes..she continues to follow up with Psychiatry service.Her blood pressure log reviewed readings ranging in the 110's/60's-150's/80's with x 3 occasional readings >160.she denies any headache,dizziness,chest pain or shortness of breath.   Past Medical History:  Diagnosis Date   Abnormal mammogram, unspecified 04/19/2011   Allergy    Anxiety    Backache, unspecified 01/21/2005   Cervicalgia 08/20/2013   Chronic kidney disease, stage II (mild) 04/03/2009   Chronic rhinitis 09/28/2010   DDD (degenerative disc disease)    Depression    Dermatophytosis of nail 01/21/2005   Diverticulosis of colon (without mention of hemorrhage) 06/17/2005   Edema 11/26/2004   GERD (gastroesophageal reflux disease)    Hiatal hernia    High cholesterol    Hyperglycemia 11/18/2014   Hypertension    Insomnia, unspecified 10/18/2011   Left inguinal hernia 04/24/2014   Nocturia 09/19/2008   Osteoporosis, senile 11/18/2014   Other abnormal blood chemistry 04/03/2007   Pain in joint, pelvic region and thigh 11/12/2011   Pain in joint, shoulder region 08/15/2006   Pain in limb 08/26/2005   Palpitations 09/29/2009   PAOD (peripheral arterial occlusive disease) ([place]) 11/18/2014   Right leg; diminished popliteal, dorsalis pedis, and posterior tibial artery pulsations    Postmenopausal atrophic vaginitis 12/14/2004   Scoliosis    Senile osteoporosis 12/14/2004   Tension headache 10/04/2005   Unspecified cataract 11/26/2004   Unspecified constipation 01/21/2005   Unspecified urinary incontinence 11/26/2004   Vaginal stenosis 11/18/2014   Tight band about 1/2 inches into the vagina which will not allow passage of my index finger.    Past Surgical History:  Procedure Laterality Date   ABDOMINAL HYSTERECTOMY  1969   Dr.Braun    bilateral eyelid surgery  2005   Dr.Scott   BILATERAL OOPHORECTOMY  2002   BREAST SURGERY     benign tumor removal, left breast 1969   CATARACT EXTRACTION     Right    CHOLECYSTECTOMY  1990   Dr.Moore    COLONOSCOPY  3/18/20008   inflammation, diverticular associated colitis -Dr. Ardis Hughs   COSMETIC SURGERY     ESOPHAGOGASTRODUODENOSCOPY N/A 01/24/2014   Procedure: ESOPHAGOGASTRODUODENOSCOPY (EGD);  Surgeon: Irene Shipper, MD;  Location: [facility] ENDOSCOPY;  Service: Endoscopy;  Laterality: N/A;   ESOPHAGOGASTRODUODENOSCOPY ENDOSCOPY  04/04/14   Dr. Ardis Hughs   EYE SURGERY     FOREIGN BODY REMOVAL N/A 01/24/2014   Procedure: FOREIGN BODY REMOVAL;  Surgeon: Irene Shipper, MD;  Location: [facility];  Service: Endoscopy;  Laterality: N/A;   HIP PINNING,CANNULATED  11/07/2011   Procedure: CANNULATED HIP PINNING;  Surgeon: Jessy Oto, MD;  Location: [facility];  Service: Orthopedics;  Laterality: Left;   ORIF FEMORAL NECK FRACTURE W/ DHS  11/07/2011   Dr.Nitka    TONSILLECTOMY     Dr.Joe Tamala Julian   TUBAL LIGATION     VAGINAL PROLAPSE REPAIR  2002   Dr.Horback     Allergies  Allergen Reactions   Lamictal [Lamotrigine] Swelling    Lip swelling   Penicillins Other (See Comments)    CHILDHOOD REACTION   Abilify [Aripiprazole] Rash    Allergies as of 12/07/2018      Reactions   Lamictal [lamotrigine] Swelling   Lip swelling   Penicillins Other (See Comments)   CHILDHOOD REACTION   Abilify [aripiprazole] Rash      Medication List       Accurate as of December 07, 2018  1:52 PM. If you have any questions, ask your nurse or doctor.        STOP taking these medications   DULoxetine 30 MG capsule Commonly known as: CYMBALTA Stopped by: Sandrea Hughs, NP     TAKE these medications   acetaminophen-codeine 300-30 MG tablet Commonly known as: TYLENOL #3 Take 1 tablet by mouth every 6 (six) hours as needed for moderate pain.   Acetylcysteine 600 MG Caps Take 1 capsule by mouth daily.   antiseptic oral rinse Liqd 15 mLs by Mouth Rinse route every 4 (four) hours as needed for dry mouth. Resident may self administer and keep in room   aspirin 81  MG chewable tablet Chew 81 mg by mouth daily.   Biofreeze 4 % Gel Generic drug: Menthol (Topical Analgesic) Apply 1 application topically as needed. Resident may self administer and keep in room   buPROPion 150 MG 24 hr tablet Commonly known as: WELLBUTRIN XL Take 300 mg by mouth daily.   Caltrate 600+D 600-800 MG-UNIT Tabs Generic drug: Calcium Carb-Cholecalciferol Take 1 tablet by mouth 2 (two) times daily.   cholecalciferol 1000 units tablet Commonly known as: VITAMIN D Take 1,000 Units by mouth daily.   citalopram 20 MG tablet Commonly known as: CELEXA Take 20 mg by mouth at bedtime.   clonazePAM 0.25 MG disintegrating tablet Commonly known as: KLONOPIN Take 1 tablet (0.25 mg total) by mouth 4 (four) times daily as needed for seizure.   docusate sodium 100 MG capsule Commonly known as: Colace Take 1 capsule (100 mg total) by mouth daily.   Glucosamine HCl 1000 MG Tabs Take 2,000 mg by mouth 2 (two) times daily. 2 tabs   ibandronate 150 MG tablet Commonly known as:  BONIVA Take 1 tablet (150 mg total) by mouth every 30 (thirty) days. Take in the morning with a full glass of water, on an empty stomach, and do not take anything else by mouth or lie down for the next 30 min.   meloxicam 7.5 MG tablet Commonly known as: MOBIC TAKE 1 TABLET BY MOUTH EVERY DAY AS NEEDED FOR ARTHRITIS   mirtazapine 30 MG tablet Commonly known as: REMERON Take 30 mg by mouth daily.   multivitamin with minerals Tabs tablet Take 1 tablet by mouth daily.   NIFEdipine 60 MG 24 hr tablet Commonly known as: PROCARDIA XL/NIFEDICAL XL TAKE 1 TABLET BY MOUTH EVERY DAY TO CONTROL BLOOD PRESSURE   OcuSoft Eyelid Cleansing Pads Apply 1 application topically daily.   pantoprazole 40 MG tablet Commonly known as: PROTONIX One daily to reduce stomach acid   PEPTO-BISMOL PO Take 30 mg by mouth as needed. For diarrhea   polyethylene glycol 17 g packet Commonly known as: MIRALAX /  GLYCOLAX Take 17 g by mouth daily.   pravastatin 10 MG tablet Commonly known as: PRAVACHOL Take 10 mg by mouth daily.   PRESERVISION AREDS 2+MULTI VIT PO Take 1 tablet by mouth 2 (two) times daily.   Systane 0.4-0.3 % Soln Generic drug: Polyethyl Glycol-Propyl Glycol Place 1 drop into both eyes. Can also apply  one drop to both eyes BID-PRN,Apply one drop to both eyes QID   Tussionex Pennkinetic ER 10-8 MG/5ML Suer Generic drug: chlorpheniramine-HYDROcodone Take 5 mLs by mouth every 12 (twelve) hours as needed for cough.       Review of Systems  Constitutional: Negative for appetite change, chills, fatigue and fever.  HENT: Positive for hearing loss. Negative for congestion, postnasal drip, rhinorrhea, sinus pressure, sinus pain, sneezing and sore throat.   Eyes: Positive for visual disturbance. Negative for discharge, redness and itching.       Macular degeneration has upcoming appointment with Ophthalmology next week.   Respiratory: Negative for apnea, cough, chest tightness, shortness of breath and wheezing.   Cardiovascular: Positive for leg swelling. Negative for chest pain and palpitations.  Gastrointestinal: Negative for abdominal distention, abdominal pain, constipation, diarrhea, nausea and vomiting.  Endocrine: Negative for cold intolerance, heat intolerance, polydipsia, polyphagia and polyuria.  Genitourinary: Negative for difficulty urinating, dysuria and flank pain.       Wears incontinent pads at night   Musculoskeletal: Positive for arthralgias and gait problem.  Skin: Negative for color change, pallor, rash and wound.  Neurological: Negative for dizziness, weakness, light-headedness, numbness and headaches.  Hematological: Does not bruise/bleed easily.  Psychiatric/Behavioral: Negative for agitation and sleep disturbance.       Reports depression and anxiety has improved.Feeling really good.     Immunization History  Administered Date(s) Administered    Influenza Whole 02/14/2012, 02/14/2013   Influenza, High Dose Seasonal PF 03/14/2017   Influenza-Unspecified 02/27/2014, 02/12/2015, 02/25/2016, 03/15/2017, 02/19/2018   Pneumococcal Conjugate-13 07/13/2015   Pneumococcal Polysaccharide-23 05/16/1998   Td 05/16/2004   Zoster 05/16/2005   Zoster Recombinat (Shingrix) 02/20/2018, 05/11/2018   Pertinent  Health Maintenance Due  Topic Date Due   URINE MICROALBUMIN  07/27/1935   MAMMOGRAM  12/20/2017   INFLUENZA VACCINE  12/15/2018   DEXA SCAN  Completed   PNA vac Low Risk Adult  Completed   Fall Risk  04/25/2018 04/05/2018 04/21/2017 10/17/2016 10/14/2016  Falls in the past year? 0 0 No No No  Comment - Emmi Telephone Survey: data to providers prior to load - - -  Number falls in past yr: 0 - - - -  Injury with Fall? 0 - - - -    Vitals:   12/07/18 0850  BP: 114/60  Pulse: 87  Resp: 20  Temp: 97.6 F (36.4 C)  TempSrc: Oral  SpO2: 95%  Weight: 142 lb 6.4 oz (64.6 kg)  Height: 5\' 2"  (1.575 m)   Body mass index is 26.05 kg/m. Physical Exam Vitals signs and nursing note reviewed.  Constitutional:      General: She is not in acute distress.    Appearance: She is overweight. She is not ill-appearing.  HENT:     Head: Normocephalic.     Right Ear: Tympanic membrane, ear canal and external ear normal. There is no impacted cerumen.     Left Ear: Tympanic membrane, ear canal and external ear normal. There is no impacted cerumen.     Nose: Nose normal. No congestion or rhinorrhea.     Mouth/Throat:     Mouth: Mucous membranes are moist.     Pharynx: Oropharynx is clear. No oropharyngeal exudate or posterior oropharyngeal erythema.  Eyes:     General: No scleral icterus.       Right eye: No discharge.        Left eye: No discharge.     Extraocular Movements: Extraocular movements intact.     Conjunctiva/sclera: Conjunctivae normal.     Pupils: Pupils are equal, round, and reactive to light.     Comments: Corrective  lens in place   Neck:     Musculoskeletal: Normal range of motion. No neck rigidity or muscular tenderness.     Vascular: No carotid bruit.  Cardiovascular:     Rate and Rhythm: Normal rate and regular rhythm.     Pulses: Normal pulses.     Heart sounds: Murmur present. No friction rub. No gallop.   Pulmonary:     Effort: Pulmonary effort is normal. No respiratory distress.     Breath sounds: Normal breath sounds. No wheezing, rhonchi or rales.  Chest:     Chest wall: No tenderness.  Abdominal:     General: Bowel sounds are normal. There is no distension.     Palpations: Abdomen is soft. There is no mass.     Tenderness: There is no abdominal tenderness. There is no right CVA tenderness, left CVA tenderness, guarding or rebound.  Musculoskeletal:        General: No tenderness.     Comments: Unsteady gait ambulates with a walker.bilateral lower extremities trace edema.   Lymphadenopathy:     Cervical: No cervical adenopathy.  Skin:    General: Skin is warm and dry.     Coloration: Skin is not pale.     Findings: No bruising, erythema, lesion or rash.  Neurological:     Mental Status: She is alert and oriented to person, place, and time.     Cranial Nerves: No cranial nerve deficit.     Sensory: No sensory deficit.     Motor: No weakness.     Coordination: Coordination normal.     Gait: Gait abnormal.  Psychiatric:        Mood and Affect: Mood normal.        Behavior: Behavior normal.        Thought Content: Thought content normal.        Judgment: Judgment normal.    Labs reviewed: Recent Labs    01/18/18 0831 07/09/18 09/21/18  NA 139 141 138  K  4.2 4.2 4.3  CL 103  --   --   CO2 28  --   --   GLUCOSE 86  --   --   BUN 21 21 21   CREATININE 0.93* 0.8 0.9  CALCIUM 9.7  --   --    Recent Labs    01/18/18 0831 07/09/18  AST 19 18  ALT 13 12  ALKPHOS  --  60  BILITOT 0.3  --   PROT 6.5  --    Recent Labs    07/09/18 10/08/18  WBC 6.6  --   HGB 13.3 13.2   HCT 40 40  PLT 241 245   Lab Results  Component Value Date   TSH 1.42 09/28/2018   Lab Results  Component Value Date   HGBA1C 5.5 09/28/2018   Lab Results  Component Value Date   CHOL 150 11/08/2018   HDL 47 11/08/2018   LDLCALC 84 11/08/2018   TRIG 99 11/08/2018   CHOLHDL 3.5 08/04/2017    Significant Diagnostic Results in last 30 days:  No results found.  Assessment/Plan  1. Essential hypertension B/p overall stable with x 3 occasional SBP > 160.Asymptomatic.will continue current medication and monitor for now.  2. Hyperlipidemia LDL goal <100 LDL at goal.continue on pravastatin 10 mg tablet.continue to encourage to exercise on NuStep and walking three times daily on facility hallway.    3. Gastroesophageal reflux disease without esophagitis Symptoms under control.continue on Protonix.Decline weaning off medication.    4. GAD (generalized anxiety disorder) Stable.continue on Klonopin 0.25 mg tablet four times daily as needed and Celexa 20 mg tablet daily.continue to follow up with Psychiatry service.    5. Chronic bilateral low back pain without sciatica Pain under control.continue current regimen and exercise.   6.Major Depression  Mood stable.very vibrant this visit.continue on bupropion 300 mg daily, Klonopin 0.25 mg tablet four times daily as needed and Celexa 20 mg tablet daily.she is off Cymbalta states was not taking it.continue to encourage reading and writing her Journal continue to monitor for mood changes.    Family/ staff Communication: Reviewed plan of care with patient and facility Nurse supervisor   Labs/tests ordered: None   Time spent with patient 25 minutes >50% time spent counseling; reviewing medical record; tests; labs; and developing future plan of care.   Sandrea Hughs, NP

## 2018-12-07 NOTE — Addendum Note (Signed)
Addended by: Georgina Snell on: 12/07/2018 09:27 AM   Modules accepted: Level of Service

## 2018-12-11 DIAGNOSIS — H40053 Ocular hypertension, bilateral: Secondary | ICD-10-CM | POA: Diagnosis not present

## 2018-12-11 DIAGNOSIS — Z961 Presence of intraocular lens: Secondary | ICD-10-CM | POA: Diagnosis not present

## 2018-12-11 DIAGNOSIS — H353131 Nonexudative age-related macular degeneration, bilateral, early dry stage: Secondary | ICD-10-CM | POA: Diagnosis not present

## 2018-12-11 DIAGNOSIS — H04123 Dry eye syndrome of bilateral lacrimal glands: Secondary | ICD-10-CM | POA: Diagnosis not present

## 2018-12-14 DIAGNOSIS — M79672 Pain in left foot: Secondary | ICD-10-CM | POA: Diagnosis not present

## 2018-12-14 DIAGNOSIS — Q6689 Other  specified congenital deformities of feet: Secondary | ICD-10-CM | POA: Diagnosis not present

## 2018-12-14 DIAGNOSIS — M79671 Pain in right foot: Secondary | ICD-10-CM | POA: Diagnosis not present

## 2018-12-14 DIAGNOSIS — B351 Tinea unguium: Secondary | ICD-10-CM | POA: Diagnosis not present

## 2019-01-02 DIAGNOSIS — F411 Generalized anxiety disorder: Secondary | ICD-10-CM | POA: Diagnosis not present

## 2019-01-08 ENCOUNTER — Other Ambulatory Visit: Payer: Self-pay

## 2019-01-08 ENCOUNTER — Ambulatory Visit (INDEPENDENT_AMBULATORY_CARE_PROVIDER_SITE_OTHER): Payer: Medicare Other | Admitting: Adult Health

## 2019-01-08 ENCOUNTER — Encounter: Payer: Self-pay | Admitting: Adult Health

## 2019-01-08 DIAGNOSIS — F331 Major depressive disorder, recurrent, moderate: Secondary | ICD-10-CM | POA: Diagnosis not present

## 2019-01-08 DIAGNOSIS — F411 Generalized anxiety disorder: Secondary | ICD-10-CM | POA: Diagnosis not present

## 2019-01-08 DIAGNOSIS — G4700 Insomnia, unspecified: Secondary | ICD-10-CM

## 2019-01-08 MED ORDER — BUPROPION HCL ER (XL) 150 MG PO TB24: ORAL_TABLET | ORAL | 5 refills | Status: DC

## 2019-01-08 MED ORDER — MIRTAZAPINE 30 MG PO TABS: 30.0000 mg | ORAL_TABLET | Freq: Every day | ORAL | 5 refills | Status: DC

## 2019-01-08 MED ORDER — CLONAZEPAM 0.25 MG PO TBDP: 0.2500 mg | ORAL_TABLET | Freq: Four times a day (QID) | ORAL | 5 refills | Status: DC | PRN

## 2019-01-08 MED ORDER — CITALOPRAM HYDROBROMIDE 20 MG PO TABS: 20.0000 mg | ORAL_TABLET | Freq: Every day | ORAL | 5 refills | Status: DC

## 2019-01-08 NOTE — Progress Notes (Signed)
Eaden Wohlgemuth [id number] 1926/05/16 85 y.o.  Virtual Visit via Telephone Note  I connected with pt on 01/08/19 at 11:30 AM EDT by telephone and verified that I am speaking with the correct person using two identifiers.   I discussed the limitations, risks, security and privacy concerns of performing an evaluation and management service by telephone and the availability of in person appointments. I also discussed with the patient that there may be a patient responsible charge related to this service. The patient expressed understanding and agreed to proceed.   I discussed the assessment and treatment plan with the patient. The patient was provided an opportunity to ask questions and all were answered. The patient agreed with the plan and demonstrated an understanding of the instructions.   The patient was advised to call back or seek an in-person evaluation if the symptoms worsen or if the condition fails to improve as anticipated.  I provided 30 minutes of non-face-to-face time during this encounter.  The patient was located at home.  The provider was located at [place].   Aloha Gell, NP   Subjective:   Patient ID:  Tina Hampton is a 85 y.o. (DOB 01/18/1926) female.  Chief Complaint:  Chief Complaint  Patient presents with  . Depression  . Anxiety  . Insomnia    HPI Tina Hampton presents for follow-up of anxiety, depression, and insomnia.  Describes mood today as "ok". Pleasant. Mood symptoms - denies depression, anxiety, and irritability. Stating "I'm doing very well right now". Feels like addition of Celexa has been very "helpful". Improved interest and motivation. Taking medications as prescribed.  Energy levels stable. Active, has a regular exercise routine. Working with P/T - working on "core". Enjoys some usual interests and activities. Lives [facility]. Talking with family and friends. Getting out of room when she can. Participates in  facility activities.  Appetite adequate. Weight stable. Sleeps well most nights. Averages 6 hours. Focus and concentration stable. Completing tasks. Managing aspects of household.  Denies SI or HI. Denies AH or VH.  Talks to therapist regularly - [facility].     Review of Systems:  Review of Systems  Musculoskeletal: Negative for gait problem.  Neurological: Negative for tremors.  Psychiatric/Behavioral:       Please refer to HPI    Medications: I have reviewed the patient's current medications.  Current Outpatient Medications  Medication Sig Dispense Refill  . acetaminophen-codeine (TYLENOL #3) 300-30 MG tablet Take 1 tablet by mouth every 6 (six) hours as needed for moderate pain.    . Acetylcysteine 600 MG CAPS Take 1 capsule by mouth daily.    Marland Kitchen antiseptic oral rinse (BIOTENE) LIQD 15 mLs by Mouth Rinse route every 4 (four) hours as needed for dry mouth. Resident may self administer and keep in room    . aspirin 81 MG chewable tablet Chew 81 mg by mouth daily.    . Bismuth Subsalicylate (PEPTO-BISMOL PO) Take 30 mg by mouth as needed. For diarrhea    . buPROPion (WELLBUTRIN XL) 150 MG 24 hr tablet Take two tablets daily. 60 tablet 5  . Calcium Carb-Cholecalciferol (CALTRATE 600+D) 600-800 MG-UNIT TABS Take 1 tablet by mouth 2 (two) times daily.    . chlorpheniramine-HYDROcodone (TUSSIONEX PENNKINETIC ER) 10-8 MG/5ML SUER Take 5 mLs by mouth every 12 (twelve) hours as needed for cough.    . cholecalciferol (VITAMIN D) 1000 UNITS tablet Take 1,000 Units by mouth daily.    . citalopram (CELEXA) 20 MG tablet  Take 1 tablet (20 mg total) by mouth at bedtime. 30 tablet 5  . clonazePAM (KLONOPIN) 0.25 MG disintegrating tablet Take 1 tablet (0.25 mg total) by mouth 4 (four) times daily as needed for seizure. 120 tablet 5  . docusate sodium (COLACE) 100 MG capsule Take 1 capsule (100 mg total) by mouth daily. 30 capsule 0  . Eyelid Cleansers (OCUSOFT EYELID CLEANSING) PADS Apply 1  application topically daily.    . Glucosamine HCl 1000 MG TABS Take 2,000 mg by mouth 2 (two) times daily. 2 tabs    . ibandronate (BONIVA) 150 MG tablet Take 1 tablet (150 mg total) by mouth every 30 (thirty) days. Take in the morning with a full glass of water, on an empty stomach, and do not take anything else by mouth or lie down for the next 30 min. 3 tablet 3  . meloxicam (MOBIC) 7.5 MG tablet TAKE 1 TABLET BY MOUTH EVERY DAY AS NEEDED FOR ARTHRITIS 90 tablet 1  . Menthol, Topical Analgesic, (BIOFREEZE) 4 % GEL Apply 1 application topically as needed. Resident may self administer and keep in room    . mirtazapine (REMERON) 30 MG tablet Take 1 tablet (30 mg total) by mouth daily. 30 tablet 5  . Multiple Vitamin (MULTIVITAMIN WITH MINERALS) TABS Take 1 tablet by mouth daily.    . Multiple Vitamins-Minerals (PRESERVISION AREDS 2+MULTI VIT PO) Take 1 tablet by mouth 2 (two) times daily.    Marland Kitchen NIFEdipine (PROCARDIA XL/ADALAT-CC) 60 MG 24 hr tablet TAKE 1 TABLET BY MOUTH EVERY DAY TO CONTROL BLOOD PRESSURE 90 tablet 3  . pantoprazole (PROTONIX) 40 MG tablet One daily to reduce stomach acid 90 tablet 3  . Polyethyl Glycol-Propyl Glycol (SYSTANE) 0.4-0.3 % SOLN Place 1 drop into both eyes. Can also apply  one drop to both eyes BID-PRN,Apply one drop to both eyes QID    . polyethylene glycol (MIRALAX / GLYCOLAX) packet Take 17 g by mouth daily.     . pravastatin (PRAVACHOL) 10 MG tablet Take 10 mg by mouth daily.     No current facility-administered medications for this visit.     Medication Side Effects: None  Allergies:  Allergies  Allergen Reactions  . Lamictal [Lamotrigine] Swelling    Lip swelling  . Penicillins Other (See Comments)    CHILDHOOD REACTION  . Abilify [Aripiprazole] Rash    Past Medical History:  Diagnosis Date  . Abnormal mammogram, unspecified 04/19/2011  . Allergy   . Anxiety   . Backache, unspecified 01/21/2005  . Cervicalgia 08/20/2013  . Chronic kidney disease,  stage II (mild) 04/03/2009  . Chronic rhinitis 09/28/2010  . DDD (degenerative disc disease)   . Depression   . Dermatophytosis of nail 01/21/2005  . Diverticulosis of colon (without mention of hemorrhage) 06/17/2005  . Edema 11/26/2004  . GERD (gastroesophageal reflux disease)   . Hiatal hernia   . High cholesterol   . Hyperglycemia 11/18/2014  . Hypertension   . Insomnia, unspecified 10/18/2011  . Left inguinal hernia 04/24/2014  . Nocturia 09/19/2008  . Osteoporosis, senile 11/18/2014  . Other abnormal blood chemistry 04/03/2007  . Pain in joint, pelvic region and thigh 11/12/2011  . Pain in joint, shoulder region 08/15/2006  . Pain in limb 08/26/2005  . Palpitations 09/29/2009  . PAOD (peripheral arterial occlusive disease) ([place]) 11/18/2014   Right leg; diminished popliteal, dorsalis pedis, and posterior tibial artery pulsations   . Postmenopausal atrophic vaginitis 12/14/2004  . Scoliosis   . Senile osteoporosis 12/14/2004  .  Tension headache 10/04/2005  . Unspecified cataract 11/26/2004  . Unspecified constipation 01/21/2005  . Unspecified urinary incontinence 11/26/2004  . Vaginal stenosis 11/18/2014   Tight band about 1/2 inches into the vagina which will not allow passage of my index finger.     Family History  Problem Relation Age of Onset  . Heart disease Mother   . Stroke Mother   . Emphysema Father   . Arthritis Father   . Alcohol abuse Brother   . Heart disease Son   . Leukemia Son   . Esophageal cancer Neg Hx   . Stomach cancer Neg Hx   . Colon cancer Neg Hx     Social History   Socioeconomic History  . Marital status: Widowed    Spouse name: Not on file  . Number of children: Not on file  . Years of education: Not on file  . Highest education level: Not on file  Occupational History  . 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 Financial resource strain: Not hard at all  . Food insecurity    Worry: Never true    Inability: Never true  .  Transportation needs    Medical: No    Non-medical: No  Tobacco Use  . Smoking status: Never Smoker  . Smokeless tobacco: Never Used  Substance and Sexual Activity  . Alcohol use: Not Currently    Alcohol/week: 0.0 standard drinks    Comment: one glass of wine in evening  . Drug use: No  . Sexual activity: Never  Lifestyle  . Physical activity    Days per week: 7 days    Minutes per session: 30 min  . Stress: To some extent  Relationships  . Social connections    Talks on phone: More than three times a week    Gets together: More than three times a week    Attends religious service: 1 to 4 times per year    Active member of club or organization: Yes    Attends meetings of clubs or organizations: Never    Relationship status: Widowed  . Intimate partner violence    Fear of current or ex partner: No    Emotionally abused: No    Physically abused: No    Forced sexual activity: No  Other Topics Concern  . Not on file  Social History Narrative   Lives at [facility] since 04/14/2005   Widowed (husband expired 2014)   Has Living Will, POA, MOST   Exercise: water aerobic  5 times a week   Walks with walker   Never smoked   Drinks moderate amount of caffeinate beverages daily   Alcohol none       Past Medical History, Surgical history, Social history, and Family history were reviewed and updated as appropriate.   Please see review of systems for further details on the patient's review from today.   Objective:   Physical Exam:  There were no vitals taken for this visit.  Physical Exam Constitutional:      General: She is not in acute distress.    Appearance: She is well-developed.  Musculoskeletal:        General: No deformity.  Neurological:     Mental Status: She is alert and oriented to person, place, and time.     Coordination: Coordination normal.  Psychiatric:        Attention and Perception: Attention and perception normal. She does not perceive  auditory or visual hallucinations.        Mood and Affect: Mood normal. Mood is not anxious or depressed. Affect is not labile, blunt, angry or inappropriate.        Speech: Speech normal.        Behavior: Behavior normal.        Thought Content: Thought content normal. Thought content is not paranoid or delusional. Thought content does not include homicidal or suicidal ideation. Thought content does not include homicidal or suicidal plan.        Cognition and Memory: Cognition and memory normal.        Judgment: Judgment normal.     Comments: Insight intact     Lab Review:     Component Value Date/Time   NA 138 09/21/2018   NA 143 07/10/2017   K 4.3 09/21/2018   K 4.2 07/10/2017   CL 103 01/18/2018 0831   CL 109 07/10/2017   CO2 28 01/18/2018 0831   CO2 26 07/10/2017   GLUCOSE 86 01/18/2018 0831   BUN 21 09/21/2018   CREATININE 0.9 09/21/2018   CREATININE 0.93 (H) 01/18/2018 0831   CALCIUM 9.7 01/18/2018 0831   CALCIUM 9.3 07/10/2017   CALCIUM 9.3 07/10/2017   PROT 6.5 01/18/2018 0831   PROT 5.8 07/10/2017   PROT 5.8 07/10/2017   ALBUMIN 3.5 07/10/2017   ALBUMIN 3.5 07/10/2017   AST 18 07/09/2018   AST 18 07/10/2017   ALT 12 07/09/2018   ALT 13 07/10/2017   ALKPHOS 60 07/09/2018   ALKPHOS 60 07/10/2017   BILITOT 0.3 01/18/2018 0831   BILITOT 0.2 07/10/2017   GFRNONAA 54 (L) 01/18/2018 0831   GFRAA 63 01/18/2018 0831       Component Value Date/Time   WBC 6.6 07/09/2018   WBC 5.1 08/04/2017 0000   RBC 4.24 08/04/2017 0000   HGB 13.2 10/08/2018   HGB 11.6 07/10/2017   HCT 40 10/08/2018   HCT 34 07/10/2017   PLT 245 10/08/2018   MCV 88.4 08/04/2017 0000   MCV 92.3 04/20/2014 1530   MCH 30.2 08/04/2017 0000   MCHC 34.1 08/04/2017 0000   RDW 12.2 08/04/2017 0000   RDW 13.0 08/28/2013 0945   LYMPHSABS 1,363 02/20/2017 0750   LYMPHSABS 1.3 08/28/2013 0945   MONOABS 528 10/17/2016 1512   EOSABS 141 02/20/2017 0750   EOSABS 0.3 08/28/2013 0945   BASOSABS 32  02/20/2017 0750   BASOSABS 0.0 08/28/2013 0945    No results found for: POCLITH, LITHIUM   No results found for: PHENYTOIN, PHENOBARB, VALPROATE, CBMZ   .res Assessment: Plan:    Plan:  Continue: 1. Wellbutrin XL 150mg  - 2 daily 2. Remeron 30mg  at hs 3. Celexa 20mg  daily 4. Clonazepam 0.25mg  up to 4 times daily - takes 2 a day  RTC 3/6 months  Patient advised to contact office with any questions, adverse effects, or acute worsening in signs and symptoms.  Tina Hampton was seen today for depression, anxiety and insomnia.  Diagnoses and all orders for this visit:  Generalized anxiety disorder -     buPROPion (WELLBUTRIN XL) 150 MG 24 hr tablet; Take two tablets daily. -     clonazePAM (KLONOPIN) 0.25 MG disintegrating tablet; Take 1 tablet (0.25 mg total) by mouth 4 (four) times daily as needed for seizure. -     mirtazapine (REMERON) 30 MG tablet; Take 1 tablet (30 mg total) by mouth daily. -     citalopram (CELEXA) 20 MG tablet; Take 1  tablet (20 mg total) by mouth at bedtime.  Major depressive disorder, recurrent episode, moderate (HCC) -     buPROPion (WELLBUTRIN XL) 150 MG 24 hr tablet; Take two tablets daily. -     mirtazapine (REMERON) 30 MG tablet; Take 1 tablet (30 mg total) by mouth daily. -     citalopram (CELEXA) 20 MG tablet; Take 1 tablet (20 mg total) by mouth at bedtime.  Insomnia, unspecified type -     mirtazapine (REMERON) 30 MG tablet; Take 1 tablet (30 mg total) by mouth daily.    Please see After Visit Summary for patient specific instructions.  No future appointments.  No orders of the defined types were placed in this encounter.     -------------------------------

## 2019-01-23 DIAGNOSIS — F411 Generalized anxiety disorder: Secondary | ICD-10-CM | POA: Diagnosis not present

## 2019-01-24 DIAGNOSIS — B354 Tinea corporis: Secondary | ICD-10-CM | POA: Diagnosis not present

## 2019-01-24 DIAGNOSIS — D1801 Hemangioma of skin and subcutaneous tissue: Secondary | ICD-10-CM | POA: Diagnosis not present

## 2019-01-24 DIAGNOSIS — L821 Other seborrheic keratosis: Secondary | ICD-10-CM | POA: Diagnosis not present

## 2019-01-24 DIAGNOSIS — L814 Other melanin hyperpigmentation: Secondary | ICD-10-CM | POA: Diagnosis not present

## 2019-02-04 ENCOUNTER — Telehealth: Payer: Self-pay | Admitting: Adult Health

## 2019-02-04 NOTE — Telephone Encounter (Signed)
Inez Catalina called and asked that you call her.  She thinks she needs a medication adjustment.  No appt scheduled yet.  Last appt was 8/25 telehealth

## 2019-02-06 NOTE — Telephone Encounter (Signed)
Pt left v-mail stating needs to talk to you about med change.[phone]

## 2019-02-06 NOTE — Telephone Encounter (Signed)
Pt called again this am stating it is very important that RM returns her call concerning her medication.

## 2019-02-06 NOTE — Telephone Encounter (Signed)
I spoke with her therapist this morning - she is going to talk to her at 4 today - and then update me. Will discuss medication changes after that.

## 2019-02-11 DIAGNOSIS — E785 Hyperlipidemia, unspecified: Secondary | ICD-10-CM | POA: Diagnosis not present

## 2019-02-12 DIAGNOSIS — F411 Generalized anxiety disorder: Secondary | ICD-10-CM | POA: Diagnosis not present

## 2019-02-19 DIAGNOSIS — F411 Generalized anxiety disorder: Secondary | ICD-10-CM | POA: Diagnosis not present

## 2019-02-25 ENCOUNTER — Non-Acute Institutional Stay: Payer: Medicare Other | Admitting: Internal Medicine

## 2019-02-25 ENCOUNTER — Encounter: Payer: Self-pay | Admitting: Internal Medicine

## 2019-02-25 DIAGNOSIS — M545 Low back pain: Secondary | ICD-10-CM | POA: Diagnosis not present

## 2019-02-25 DIAGNOSIS — F411 Generalized anxiety disorder: Secondary | ICD-10-CM

## 2019-02-25 DIAGNOSIS — F339 Major depressive disorder, recurrent, unspecified: Secondary | ICD-10-CM

## 2019-02-25 DIAGNOSIS — M810 Age-related osteoporosis without current pathological fracture: Secondary | ICD-10-CM | POA: Diagnosis not present

## 2019-02-25 DIAGNOSIS — E785 Hyperlipidemia, unspecified: Secondary | ICD-10-CM

## 2019-02-25 DIAGNOSIS — G8929 Other chronic pain: Secondary | ICD-10-CM | POA: Diagnosis not present

## 2019-02-25 DIAGNOSIS — I10 Essential (primary) hypertension: Secondary | ICD-10-CM

## 2019-02-25 NOTE — Progress Notes (Signed)
Location:    Nursing Home Room Number: 8 Place of Service:  ALF (13) Provider:  Virgie Dad, MD  Virgie Dad, MD  Patient Care Team: Virgie Dad, MD as PCP - General (Internal Medicine) Melina Modena, [facility] Clent Jacks, MD as Consulting Physician (Ophthalmology) Milus Banister, MD as Consulting Physician (Gastroenterology) Norma Fredrickson, MD as Consulting Physician (Psychiatry) Jessy Oto, MD as Consulting Physician (Orthopedic Surgery) Bjorn Loser, MD as Consulting Physician (Urology)  Extended Emergency Contact Information Primary Emergency Contact: [place], [place] [address] Johnnette Litter of [place] Phone: [phone] Mobile Phone: [phone] Relation: Son  Code Status:  Full Code Goals of care: Advanced Directive information Advanced Directives 12/07/2018  Does Patient Have a Medical Advance Directive? Yes  Type of Paramedic of [place];Living will  Does patient want to make changes to medical advance directive? No - Patient declined  Copy of [place] in Chart? Yes - validated most recent copy scanned in chart (See row information)  Pre-existing out of facility DNR order (yellow form or pink MOST form) -     Chief Complaint  Patient presents with  . Medical Management of Chronic Issues  . Health Maintenance    Urine microalbumin, TDAP, Mammogram, influenza vaccine    HPI:  Pt is a 85 y.o. female seen today for medical management of chronic diseases.    Patient has h/o Major Depression with Anxiety, Hyperlipidemia, Chronic Bilateral Low Back Pain, Spinal Stenosis , Osteoarthritis, history of osteoporosis, hypertension  Patient is a long[place]term resident in [place].  Has kyphosis and walks with a walker.  Is independent in her ADLs. Her Main problem is anxiety and depression.  She states that physically she is doing okay.  But mentally she is getting anxious again.  Has few  good days.  Her weight has been stable her appetite has been good.  She has been sleeping well at night.  She is followed closely by psychiatrist.  And was on a number of antidepressant and antianxiety medications She is working with therapy Past Medical History:  Diagnosis Date  . Abnormal mammogram, unspecified 04/19/2011  . Allergy   . Anxiety   . Backache, unspecified 01/21/2005  . Cervicalgia 08/20/2013  . Chronic kidney disease, stage II (mild) 04/03/2009  . Chronic rhinitis 09/28/2010  . DDD (degenerative disc disease)   . Depression   . Dermatophytosis of nail 01/21/2005  . Diverticulosis of colon (without mention of hemorrhage) 06/17/2005  . Edema 11/26/2004  . GERD (gastroesophageal reflux disease)   . Hiatal hernia   . High cholesterol   . Hyperglycemia 11/18/2014  . Hypertension   . Insomnia, unspecified 10/18/2011  . Left inguinal hernia 04/24/2014  . Nocturia 09/19/2008  . Osteoporosis, senile 11/18/2014  . Other abnormal blood chemistry 04/03/2007  . Pain in joint, pelvic region and thigh 11/12/2011  . Pain in joint, shoulder region 08/15/2006  . Pain in limb 08/26/2005  . Palpitations 09/29/2009  . PAOD (peripheral arterial occlusive disease) ([place]) 11/18/2014   Right leg; diminished popliteal, dorsalis pedis, and posterior tibial artery pulsations   . Postmenopausal atrophic vaginitis 12/14/2004  . Scoliosis   . Senile osteoporosis 12/14/2004  . Tension headache 10/04/2005  . Unspecified cataract 11/26/2004  . Unspecified constipation 01/21/2005  . Unspecified urinary incontinence 11/26/2004  . Vaginal stenosis 11/18/2014   Tight band about 1/2 inches into the vagina which will not allow passage of  my index finger.    Past Surgical History:  Procedure Laterality Date  . ABDOMINAL HYSTERECTOMY  1969   Dr.Braun   . bilateral eyelid surgery  2005   Dr.Scott  . BILATERAL OOPHORECTOMY  2002  . BREAST SURGERY     benign tumor removal, left breast 1969  . CATARACT EXTRACTION     Right  .  CHOLECYSTECTOMY  1990   Dr.Moore   . COLONOSCOPY  3/18/20008   inflammation, diverticular associated colitis -Dr. Ardis Hughs  . COSMETIC SURGERY    . ESOPHAGOGASTRODUODENOSCOPY N/A 01/24/2014   Procedure: ESOPHAGOGASTRODUODENOSCOPY (EGD);  Surgeon: Irene Shipper, MD;  Location: [facility] ENDOSCOPY;  Service: Endoscopy;  Laterality: N/A;  . ESOPHAGOGASTRODUODENOSCOPY ENDOSCOPY  04/04/14   Dr. Ardis Hughs  . EYE SURGERY    . FOREIGN BODY REMOVAL N/A 01/24/2014   Procedure: FOREIGN BODY REMOVAL;  Surgeon: Irene Shipper, MD;  Location: [place];  Service: Endoscopy;  Laterality: N/A;  . HIP PINNING,CANNULATED  11/07/2011   Procedure: CANNULATED HIP PINNING;  Surgeon: Jessy Oto, MD;  Location: [facility];  Service: Orthopedics;  Laterality: Left;  . ORIF FEMORAL NECK FRACTURE W/ [facility]  11/07/2011   Dr.Nitka   . TONSILLECTOMY     Dr.Joe Tamala Julian  . TUBAL LIGATION    . VAGINAL PROLAPSE REPAIR  2002   Dr.Horback     Allergies  Allergen Reactions  . Lamictal [Lamotrigine] Swelling    Lip swelling  . Penicillins Other (See Comments)    CHILDHOOD REACTION  . Abilify [Aripiprazole] Rash    Allergies as of 02/25/2019      Reactions   Lamictal [lamotrigine] Swelling   Lip swelling   Penicillins Other (See Comments)   CHILDHOOD REACTION   Abilify [aripiprazole] Rash      Medication List       Accurate as of February 25, 2019 10:14 AM. If you have any questions, ask your nurse or doctor.        acetaminophen-codeine 300-30 MG tablet Commonly known as: TYLENOL #3 Take 1 tablet by mouth every 6 (six) hours as needed for moderate pain.   Acetylcysteine 600 MG Caps Take 1 capsule by mouth daily.   antiseptic oral rinse Liqd 15 mLs by Mouth Rinse route every 4 (four) hours as needed for dry mouth. Resident may self administer and keep in room   aspirin 81 MG chewable tablet Chew 81 mg by mouth daily.   Biofreeze 4 % Gel Generic drug: Menthol (Topical Analgesic) Apply 1 application topically as  needed. Resident may self administer and keep in room   buPROPion 150 MG 24 hr tablet Commonly known as: WELLBUTRIN XL Take two tablets daily.   Caltrate 600+D 600-800 MG-UNIT Tabs Generic drug: Calcium Carb-Cholecalciferol Take 1 tablet by mouth 2 (two) times daily.   cholecalciferol 1000 units tablet Commonly known as: VITAMIN D Take 1,000 Units by mouth daily.   citalopram 20 MG tablet Commonly known as: CELEXA Take 1 tablet (20 mg total) by mouth at bedtime.   clonazePAM 0.25 MG disintegrating tablet Commonly known as: KLONOPIN Take 1 tablet (0.25 mg total) by mouth 4 (four) times daily as needed for seizure.   docusate sodium 100 MG capsule Commonly known as: Colace Take 1 capsule (100 mg total) by mouth daily.   econazole nitrate 1 % cream Apply topically. apply once daily until till resolved, may self administer, may keep at bedside   Glucosamine HCl 1000 MG Tabs Take 2,000 mg by mouth 2 (two) times daily.  2 tabs   ibandronate 150 MG tablet Commonly known as: BONIVA Take 1 tablet (150 mg total) by mouth every 30 (thirty) days. Take in the morning with a full glass of water, on an empty stomach, and do not take anything else by mouth or lie down for the next 30 min.   Latuda 20 MG Tabs tablet Generic drug: lurasidone Take by mouth. QD with evening meal, resident may self administer, may keep at bedside.   meloxicam 7.5 MG tablet Commonly known as: MOBIC TAKE 1 TABLET BY MOUTH EVERY DAY AS NEEDED FOR ARTHRITIS   miconazole 2 % powder Commonly known as: MICOTIN Apply topically as needed for itching. apply to legs and feet once a day   mirtazapine 30 MG tablet Commonly known as: REMERON Take 1 tablet (30 mg total) by mouth daily.   multivitamin with minerals Tabs tablet Take 1 tablet by mouth daily.   NIFEdipine 60 MG 24 hr tablet Commonly known as: PROCARDIA XL/NIFEDICAL XL TAKE 1 TABLET BY MOUTH EVERY DAY TO CONTROL BLOOD PRESSURE   OcuSoft Eyelid  Cleansing Pads Apply 1 application topically daily.   pantoprazole 40 MG tablet Commonly known as: PROTONIX One daily to reduce stomach acid   PEPTO-BISMOL PO Take 30 mg by mouth as needed. For diarrhea   polyethylene glycol 17 g packet Commonly known as: MIRALAX / GLYCOLAX Take 17 g by mouth daily.   pravastatin 10 MG tablet Commonly known as: PRAVACHOL Take 10 mg by mouth daily.   PRESERVISION AREDS 2+MULTI VIT PO Take 1 tablet by mouth 2 (two) times daily.   Systane 0.4-0.3 % Soln Generic drug: Polyethyl Glycol-Propyl Glycol Place 1 drop into both eyes. Can also apply  one drop to both eyes BID-PRN,Apply one drop to both eyes QID   Tussionex Pennkinetic ER 10-8 MG/5ML Suer Generic drug: chlorpheniramine-HYDROcodone Take 5 mLs by mouth every 12 (twelve) hours as needed for cough.       Review of Systems  Constitutional: Negative.   HENT: Negative.   Respiratory: Negative.   Cardiovascular: Negative.   Gastrointestinal: Negative.   Genitourinary: Negative.   Musculoskeletal: Negative.   Skin: Negative.   Neurological: Negative.   Psychiatric/Behavioral: Positive for dysphoric mood. The patient is nervous/anxious.   All other systems reviewed and are negative.   Immunization History  Administered Date(s) Administered  . Influenza Whole 02/14/2012, 02/14/2013  . Influenza, High Dose Seasonal PF 03/14/2017  . Influenza-Unspecified 02/27/2014, 02/12/2015, 02/25/2016, 03/15/2017, 02/19/2018  . Pneumococcal Conjugate-13 07/13/2015  . Pneumococcal Polysaccharide-23 05/16/1998  . Td 05/16/2004  . Zoster 05/16/2005  . Zoster Recombinat (Shingrix) 02/20/2018, 05/11/2018   Pertinent  Health Maintenance Due  Topic Date Due  . URINE MICROALBUMIN  07/27/1935  . MAMMOGRAM  12/20/2017  . INFLUENZA VACCINE  12/15/2018  . DEXA SCAN  Completed  . PNA vac Low Risk Adult  Completed   Fall Risk  04/25/2018 04/05/2018 04/21/2017 10/17/2016 10/14/2016  Falls in the past year? 0  0 No No No  Comment - Emmi Telephone Survey: data to providers prior to load - - -  Number falls in past yr: 0 - - - -  Injury with Fall? 0 - - - -   Functional Status Survey:    Vitals:   02/25/19 0957  BP: (!) 146/82  Pulse: 83  Resp: 18  Temp: (!) 97.1 F (36.2 C)  SpO2: 96%  Weight: 148 lb 9.6 oz (67.4 kg)  Height: 5\' 2"  (1.575 m)   Body mass  index is 27.18 kg/m. Physical Exam Vitals signs reviewed.  Constitutional:      Appearance: Normal appearance.  HENT:     Head: Normocephalic.     Nose: Nose normal.     Mouth/Throat:     Mouth: Mucous membranes are moist.     Pharynx: Oropharynx is clear.  Eyes:     Pupils: Pupils are equal, round, and reactive to light.  Neck:     Musculoskeletal: Neck supple.  Cardiovascular:     Rate and Rhythm: Normal rate.     Heart sounds: Murmur present.  Pulmonary:     Effort: Pulmonary effort is normal.     Breath sounds: Normal breath sounds.  Abdominal:     General: Abdomen is flat. Bowel sounds are normal.     Palpations: Abdomen is soft.  Musculoskeletal:        General: Swelling present.  Skin:    General: Skin is warm.  Neurological:     General: No focal deficit present.     Mental Status: She is alert and oriented to person, place, and time.  Psychiatric:        Mood and Affect: Mood is anxious and depressed.        Thought Content: Thought content normal.     Labs reviewed: Recent Labs    07/09/18 09/21/18  NA 141 138  K 4.2 4.3  BUN 21 21  CREATININE 0.8 0.9   Recent Labs    07/09/18  AST 18  ALT 12  ALKPHOS 60   Recent Labs    07/09/18 10/08/18  WBC 6.6  --   HGB 13.3 13.2  HCT 40 40  PLT 241 245   Lab Results  Component Value Date   TSH 1.42 09/28/2018   Lab Results  Component Value Date   HGBA1C 5.5 09/28/2018   Lab Results  Component Value Date   CHOL 150 11/08/2018   HDL 47 11/08/2018   LDLCALC 84 11/08/2018   TRIG 99 11/08/2018   CHOLHDL 3.5 08/04/2017    Significant  Diagnostic Results in last 30 days:  No results found.  Assessment/Plan Essential hypertension She does have some elevated blood pressure reading But mostly controlled on Procardia  Hyperlipidemia LDL goal <100 Recently done lipid panel showed LDL less than 100 Continue on Pravachol  GAD (generalized anxiety disorder) On Latuda ,Klonopin PRN and Remeron  Major depression, recurrent, chronic (HCC) On Wellbutrin and Celexa Follows with Psychiatrist  Chronic bilateral low back pain without sciatica Uses Mobic as needed Doing well with therapy  Age-related osteoporosis without current pathological fracture Patient has not taken any Boniva in the past few months Her last T score in 07/05/2016 was -2.6 was seen by Dr.Deveyeshwar   she refused Prolia and other medicines  refused again today  will discontinue her Bonivia GERD On Protonix     Family/ staff Communication:  Labs/tests ordered:  CMP,CBC  Total time spent in this patient care encounter was 25  minutes; greater than 50% of the visit spent counseling patient and staff, reviewing records , Labs and coordinating care for problems addressed at this encounter.

## 2019-02-27 DIAGNOSIS — F411 Generalized anxiety disorder: Secondary | ICD-10-CM | POA: Diagnosis not present

## 2019-02-28 DIAGNOSIS — D649 Anemia, unspecified: Secondary | ICD-10-CM | POA: Diagnosis not present

## 2019-02-28 DIAGNOSIS — F0390 Unspecified dementia without behavioral disturbance: Secondary | ICD-10-CM | POA: Diagnosis not present

## 2019-03-07 DIAGNOSIS — F411 Generalized anxiety disorder: Secondary | ICD-10-CM | POA: Diagnosis not present

## 2019-03-12 DIAGNOSIS — F411 Generalized anxiety disorder: Secondary | ICD-10-CM | POA: Diagnosis not present

## 2019-03-21 DIAGNOSIS — F411 Generalized anxiety disorder: Secondary | ICD-10-CM | POA: Diagnosis not present

## 2019-03-27 ENCOUNTER — Ambulatory Visit (INDEPENDENT_AMBULATORY_CARE_PROVIDER_SITE_OTHER): Payer: Medicare Other | Admitting: Adult Health

## 2019-03-27 ENCOUNTER — Encounter: Payer: Self-pay | Admitting: Adult Health

## 2019-03-27 DIAGNOSIS — G4700 Insomnia, unspecified: Secondary | ICD-10-CM

## 2019-03-27 DIAGNOSIS — F331 Major depressive disorder, recurrent, moderate: Secondary | ICD-10-CM

## 2019-03-27 DIAGNOSIS — F429 Obsessive-compulsive disorder, unspecified: Secondary | ICD-10-CM | POA: Diagnosis not present

## 2019-03-27 DIAGNOSIS — F411 Generalized anxiety disorder: Secondary | ICD-10-CM

## 2019-03-27 NOTE — Progress Notes (Signed)
Tina Hampton [id number] [birth year] 85 y.o.  Virtual Visit via Telephone Note  I connected with pt on 03/27/19 at  2:40 PM EST by telephone and verified that I am speaking with the correct person using two identifiers.   I discussed the limitations, risks, security and privacy concerns of performing an evaluation and management service by telephone and the availability of in person appointments. I also discussed with the patient that there may be a patient responsible charge related to this service. The patient expressed understanding and agreed to proceed.   I discussed the assessment and treatment plan with the patient. The patient was provided an opportunity to ask questions and all were answered. The patient agreed with the plan and demonstrated an understanding of the instructions.   The patient was advised to call back or seek an in-person evaluation if the symptoms worsen or if the condition fails to improve as anticipated.  I provided 30 minutes of non-face-to-face time during this encounter.  The patient was located at home.  The provider was located at [place].   Aloha Gell, NP   Subjective:   Patient ID:  Tina Hampton is a 85 y.o. (DOB 11/08/1925) female.  Chief Complaint:  No chief complaint on file.   HPI Tina Hampton presents for follow-up of anxiety, depression, and insomnia.  Describes mood today as "not good at all". Pleasant. Mood symptoms - reports depression, anxiety, and irritability. States "I'm feeling terrible". Also stating "the medication was working, but now it's not". Feels like she "worries" all the time - is "scared". Stating "I can't think of anything happy. Tries to talk herself out of thinking "negatively", but can't do it. Is exercising and staying active - "Im doing everything I can".  Talks to therapist regularly - [facility]. Interacting with staff and residents. Talking with family members. Decreased interest  and motivation. Taking medications as prescribed.  Energy levels "lower". Active, has a regular exercise routine. Working with P/T. Enjoys some usual interests and activities. Lives [facility]. Talking with family and friends. Getting out of room "some".  Appetite adequate. Weight stable. Sleeps well most nights. Averages 6 hours. Up and down "some".  Focus and concentration stable. Completing tasks. Managing aspects of household.  Denies SI or HI. Denies AH or VH.  Review of Systems:  Review of Systems  Musculoskeletal: Negative for gait problem.  Neurological: Negative for tremors.  Psychiatric/Behavioral:       Please refer to HPI    Medications: I have reviewed the patient's current medications.  Current Outpatient Medications  Medication Sig Dispense Refill  . acetaminophen-codeine (TYLENOL #3) 300-30 MG tablet Take 1 tablet by mouth every 6 (six) hours as needed for moderate pain.    . Acetylcysteine 600 MG CAPS Take 1 capsule by mouth daily.    Marland Kitchen antiseptic oral rinse (BIOTENE) LIQD 15 mLs by Mouth Rinse route every 4 (four) hours as needed for dry mouth. Resident may self administer and keep in room    . aspirin 81 MG chewable tablet Chew 81 mg by mouth daily.    . Bismuth Subsalicylate (PEPTO-BISMOL PO) Take 30 mg by mouth as needed. For diarrhea    . buPROPion (WELLBUTRIN XL) 150 MG 24 hr tablet Take two tablets daily. 60 tablet 5  . Calcium Carb-Cholecalciferol (CALTRATE 600+D) 600-800 MG-UNIT TABS Take 1 tablet by mouth 2 (two) times daily.    . chlorpheniramine-HYDROcodone (TUSSIONEX PENNKINETIC ER) 10-8 MG/5ML SUER Take 5 mLs by mouth every  12 (twelve) hours as needed for cough.    . cholecalciferol (VITAMIN D) 1000 UNITS tablet Take 1,000 Units by mouth daily.    . citalopram (CELEXA) 20 MG tablet Take 1 tablet (20 mg total) by mouth at bedtime. 30 tablet 5  . clonazePAM (KLONOPIN) 0.25 MG disintegrating tablet Take 1 tablet (0.25 mg total) by mouth 4 (four) times  daily as needed for seizure. 120 tablet 5  . docusate sodium (COLACE) 100 MG capsule Take 1 capsule (100 mg total) by mouth daily. 30 capsule 0  . econazole nitrate 1 % cream Apply topically. apply once daily until till resolved, may self administer, may keep at bedside    . Eyelid Cleansers (OCUSOFT EYELID CLEANSING) PADS Apply 1 application topically daily.    . Glucosamine HCl 1000 MG TABS Take 2,000 mg by mouth 2 (two) times daily. 2 tabs    . ibandronate (BONIVA) 150 MG tablet Take 1 tablet (150 mg total) by mouth every 30 (thirty) days. Take in the morning with a full glass of water, on an empty stomach, and do not take anything else by mouth or lie down for the next 30 min. 3 tablet 3  . lurasidone (LATUDA) 20 MG TABS tablet Take by mouth. QD with evening meal, resident may self administer, may keep at bedside.    . meloxicam (MOBIC) 7.5 MG tablet TAKE 1 TABLET BY MOUTH EVERY DAY AS NEEDED FOR ARTHRITIS 90 tablet 1  . Menthol, Topical Analgesic, (BIOFREEZE) 4 % GEL Apply 1 application topically as needed. Resident may self administer and keep in room    . miconazole (MICOTIN) 2 % powder Apply topically as needed for itching. apply to legs and feet once a day    . mirtazapine (REMERON) 30 MG tablet Take 1 tablet (30 mg total) by mouth daily. 30 tablet 5  . Multiple Vitamin (MULTIVITAMIN WITH MINERALS) TABS Take 1 tablet by mouth daily.    . Multiple Vitamins-Minerals (PRESERVISION AREDS 2+MULTI VIT PO) Take 1 tablet by mouth 2 (two) times daily.    Marland Kitchen NIFEdipine (PROCARDIA XL/ADALAT-CC) 60 MG 24 hr tablet TAKE 1 TABLET BY MOUTH EVERY DAY TO CONTROL BLOOD PRESSURE 90 tablet 3  . pantoprazole (PROTONIX) 40 MG tablet One daily to reduce stomach acid 90 tablet 3  . Polyethyl Glycol-Propyl Glycol (SYSTANE) 0.4-0.3 % SOLN Place 1 drop into both eyes. Can also apply  one drop to both eyes BID-PRN,Apply one drop to both eyes QID    . polyethylene glycol (MIRALAX / GLYCOLAX) packet Take 17 g by mouth  daily.     . pravastatin (PRAVACHOL) 10 MG tablet Take 10 mg by mouth daily.     No current facility-administered medications for this visit.     Medication Side Effects: None  Allergies:  Allergies  Allergen Reactions  . Lamictal [Lamotrigine] Swelling    Lip swelling  . Penicillins Other (See Comments)    CHILDHOOD REACTION  . Abilify [Aripiprazole] Rash    Past Medical History:  Diagnosis Date  . Abnormal mammogram, unspecified 04/19/2011  . Allergy   . Anxiety   . Backache, unspecified 01/21/2005  . Cervicalgia 08/20/2013  . Chronic kidney disease, stage II (mild) 04/03/2009  . Chronic rhinitis 09/28/2010  . DDD (degenerative disc disease)   . Depression   . Dermatophytosis of nail 01/21/2005  . Diverticulosis of colon (without mention of hemorrhage) 06/17/2005  . Edema 11/26/2004  . GERD (gastroesophageal reflux disease)   . Hiatal hernia   .  High cholesterol   . Hyperglycemia 11/18/2014  . Hypertension   . Insomnia, unspecified 10/18/2011  . Left inguinal hernia 04/24/2014  . Nocturia 09/19/2008  . Osteoporosis, senile 11/18/2014  . Other abnormal blood chemistry 04/03/2007  . Pain in joint, pelvic region and thigh 11/12/2011  . Pain in joint, shoulder region 08/15/2006  . Pain in limb 08/26/2005  . Palpitations 09/29/2009  . PAOD (peripheral arterial occlusive disease) ([place]) 11/18/2014   Right leg; diminished popliteal, dorsalis pedis, and posterior tibial artery pulsations   . Postmenopausal atrophic vaginitis 12/14/2004  . Scoliosis   . Senile osteoporosis 12/14/2004  . Tension headache 10/04/2005  . Unspecified cataract 11/26/2004  . Unspecified constipation 01/21/2005  . Unspecified urinary incontinence 11/26/2004  . Vaginal stenosis 11/18/2014   Tight band about 1/2 inches into the vagina which will not allow passage of my index finger.     Family History  Problem Relation Age of Onset  . Heart disease Mother   . Stroke Mother   . Emphysema Father   . Arthritis Father   .  Alcohol abuse Brother   . Heart disease Son   . Leukemia Son   . Esophageal cancer Neg Hx   . Stomach cancer Neg Hx   . Colon cancer Neg Hx     Social History   Socioeconomic History  . Marital status: Widowed    Spouse name: Not on file  . Number of children: Not on file  . Years of education: Not on file  . Highest education level: Not on file  Occupational History  . 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 Financial resource strain: Not hard at all  . Food insecurity    Worry: Never true    Inability: Never true  . Transportation needs    Medical: No    Non-medical: No  Tobacco Use  . Smoking status: Never Smoker  . Smokeless tobacco: Never Used  Substance and Sexual Activity  . Alcohol use: Not Currently    Alcohol/week: 0.0 standard drinks    Comment: one glass of wine in evening  . Drug use: No  . Sexual activity: Never  Lifestyle  . Physical activity    Days per week: 7 days    Minutes per session: 30 min  . Stress: To some extent  Relationships  . Social connections    Talks on phone: More than three times a week    Gets together: More than three times a week    Attends religious service: 1 to 4 times per year    Active member of club or organization: Yes    Attends meetings of clubs or organizations: Never    Relationship status: Widowed  . Intimate partner violence    Fear of current or ex partner: No    Emotionally abused: No    Physically abused: No    Forced sexual activity: No  Other Topics Concern  . Not on file  Social History Narrative   Lives [facility] since 04/14/2005   Widowed (husband expired 2014)   Has Living Will, POA, MOST   Exercise: water aerobic  5 times a week   Walks with walker   Never smoked   Drinks moderate amount of caffeinate beverages daily   Alcohol none       Past Medical History, Surgical history, Social history, and Family history were reviewed and updated as  appropriate.   Please see review  of systems for further details on the patient's review from today.   Objective:   Physical Exam:  There were no vitals taken for this visit.  Physical Exam Constitutional:      General: She is not in acute distress.    Appearance: She is well-developed.  Musculoskeletal:        General: No deformity.  Neurological:     Mental Status: She is alert and oriented to person, place, and time.     Coordination: Coordination normal.  Psychiatric:        Attention and Perception: Attention and perception normal. She does not perceive auditory or visual hallucinations.        Mood and Affect: Mood is anxious and depressed. Affect is not labile, blunt, angry or inappropriate.        Speech: Speech normal.        Behavior: Behavior is agitated.        Thought Content: Thought content normal. Thought content is not paranoid or delusional. Thought content does not include homicidal or suicidal ideation. Thought content does not include homicidal or suicidal plan.        Cognition and Memory: Cognition and memory normal.        Judgment: Judgment normal.     Comments: Insight intact     Lab Review:     Component Value Date/Time   NA 138 09/21/2018   NA 143 07/10/2017   K 4.3 09/21/2018   K 4.2 07/10/2017   CL 103 01/18/2018 0831   CL 109 07/10/2017   CO2 28 01/18/2018 0831   CO2 26 07/10/2017   GLUCOSE 86 01/18/2018 0831   BUN 21 09/21/2018   CREATININE 0.9 09/21/2018   CREATININE 0.93 (H) 01/18/2018 0831   CALCIUM 9.7 01/18/2018 0831   CALCIUM 9.3 07/10/2017   CALCIUM 9.3 07/10/2017   PROT 6.5 01/18/2018 0831   PROT 5.8 07/10/2017   PROT 5.8 07/10/2017   ALBUMIN 3.5 07/10/2017   ALBUMIN 3.5 07/10/2017   AST 18 07/09/2018   AST 18 07/10/2017   ALT 12 07/09/2018   ALT 13 07/10/2017   ALKPHOS 60 07/09/2018   ALKPHOS 60 07/10/2017   BILITOT 0.3 01/18/2018 0831   BILITOT 0.2 07/10/2017   GFRNONAA 54 (L) 01/18/2018 0831   GFRAA 63 01/18/2018  0831       Component Value Date/Time   WBC 6.6 07/09/2018   WBC 5.1 08/04/2017 0000   RBC 4.24 08/04/2017 0000   HGB 13.2 10/08/2018   HGB 11.6 07/10/2017   HCT 40 10/08/2018   HCT 34 07/10/2017   PLT 245 10/08/2018   MCV 88.4 08/04/2017 0000   MCV 92.3 04/20/2014 1530   MCH 30.2 08/04/2017 0000   MCHC 34.1 08/04/2017 0000   RDW 12.2 08/04/2017 0000   RDW 13.0 08/28/2013 0945   LYMPHSABS 1,363 02/20/2017 0750   LYMPHSABS 1.3 08/28/2013 0945   MONOABS 528 10/17/2016 1512   EOSABS 141 02/20/2017 0750   EOSABS 0.3 08/28/2013 0945   BASOSABS 32 02/20/2017 0750   BASOSABS 0.0 08/28/2013 0945    No results found for: POCLITH, LITHIUM   No results found for: PHENYTOIN, PHENOBARB, VALPROATE, CBMZ   .res Assessment: Plan:    Plan:  Continue: 1. Decrease Wellbutrin XL 150mg  - 2 daily to 1 daily - increasing anxiety 2. Remeron 45 mg at hs - discontinue - potential noradrenergic reaction increasing anxiety. 3. Celexa 20mg  daily - will hold at this dose  4. Clonazepam 0.25 mg  up to 4 times daily - was taking 2 a day, now 4.   5. Add Zyprexa 2.5mg  at hs for mood instability/OCD/anxiety.   Multiple medication trials and failures: Latuda, Abilify, Rexulti, Effexor, Prozac, Buspar, Lamictal, Lexapro, Trintellix, Lorazepam, and others.   Called and spoke with pharmacisit about switching back to the previous Clonazepam she was taking - tongue burning with new generic - should be switched out by Friday 03/29/2019.  Continue with therapy  RTC 2 weeks  Patient advised to contact office with any questions, adverse effects, or acute worsening in signs and symptoms.  There are no diagnoses linked to this encounter.  Please see After Visit Summary for patient specific instructions.  Future Appointments  Date Time Provider [place]  03/27/2019  2:40 PM Teana Lindahl, Berdie Ogren, NP [facility] None    No orders of the defined types were placed in this encounter.      -------------------------------

## 2019-04-04 ENCOUNTER — Other Ambulatory Visit: Payer: Self-pay | Admitting: Adult Health

## 2019-04-04 ENCOUNTER — Telehealth: Payer: Self-pay | Admitting: Adult Health

## 2019-04-04 DIAGNOSIS — F411 Generalized anxiety disorder: Secondary | ICD-10-CM

## 2019-04-04 DIAGNOSIS — G4700 Insomnia, unspecified: Secondary | ICD-10-CM

## 2019-04-04 DIAGNOSIS — F331 Major depressive disorder, recurrent, moderate: Secondary | ICD-10-CM

## 2019-04-04 MED ORDER — OLANZAPINE 5 MG PO TABS: 5.0000 mg | ORAL_TABLET | Freq: Every day | ORAL | 2 refills | Status: DC

## 2019-04-04 NOTE — Telephone Encounter (Signed)
Tina Hampton at [facility] called you back about Tina Hampton. Please return call at [phone].

## 2019-04-08 ENCOUNTER — Telehealth: Payer: Self-pay | Admitting: Adult Health

## 2019-04-08 NOTE — Telephone Encounter (Signed)
Pt has increased the Zyprexa from 2.5mg  to 5.0mg   and she felt zonked out. She felt all out of sorts, although  did sleep better on 5mg .  Very very anxious. Didn't help except for sleep. Please advise.

## 2019-04-09 ENCOUNTER — Other Ambulatory Visit: Payer: Self-pay

## 2019-04-18 ENCOUNTER — Telehealth: Payer: Self-pay | Admitting: Adult Health

## 2019-04-18 NOTE — Telephone Encounter (Signed)
Tina Hampton called wanting to talk with you asap.  She feels her meds are not working.  Just feels like she is losing it.  Hasn't ever been like this.  Knows you're not here tomorrow and really to talk with you.  Please call.

## 2019-04-25 ENCOUNTER — Other Ambulatory Visit: Payer: Self-pay | Admitting: *Deleted

## 2019-04-25 DIAGNOSIS — F411 Generalized anxiety disorder: Secondary | ICD-10-CM

## 2019-04-25 MED ORDER — CLONAZEPAM 0.25 MG PO TBDP: 0.2500 mg | ORAL_TABLET | Freq: Four times a day (QID) | ORAL | 1 refills | Status: DC | PRN

## 2019-04-25 NOTE — Telephone Encounter (Signed)
Received fax request from [facility] and sent to Dr. Lyndel Safe for approval.

## 2019-04-30 ENCOUNTER — Telehealth: Payer: Self-pay | Admitting: Adult Health

## 2019-04-30 DIAGNOSIS — Z20828 Contact with and (suspected) exposure to other viral communicable diseases: Secondary | ICD-10-CM | POA: Diagnosis not present

## 2019-04-30 NOTE — Telephone Encounter (Signed)
Patient called and said that she needs help. She is confused about how to take her klonopin. Please call her and tell her how to take the medicine. Her number is [phone]

## 2019-05-02 NOTE — Telephone Encounter (Signed)
Rollene Fare contacted patient

## 2019-05-06 ENCOUNTER — Telehealth: Payer: Self-pay | Admitting: Adult Health

## 2019-05-06 DIAGNOSIS — Z20828 Contact with and (suspected) exposure to other viral communicable diseases: Secondary | ICD-10-CM | POA: Diagnosis not present

## 2019-05-06 NOTE — Telephone Encounter (Signed)
Called and spoke with [place].

## 2019-05-06 NOTE — Telephone Encounter (Signed)
Pt ask nurse to talk with you about possible side effects from meds. Elmyra Ricks nurse # [phone]

## 2019-05-08 ENCOUNTER — Telehealth: Payer: Self-pay | Admitting: Adult Health

## 2019-05-08 NOTE — Telephone Encounter (Signed)
Tina Hampton called about 5:00pm to report that the Klonopin is wrecking her.  She is just going further and further down.  She needs some help.  Knows she can't stop the klonopin without having something else.  Please call to discuss options.

## 2019-05-14 ENCOUNTER — Non-Acute Institutional Stay: Payer: Medicare Other | Admitting: Nurse Practitioner

## 2019-05-14 ENCOUNTER — Encounter: Payer: Self-pay | Admitting: Nurse Practitioner

## 2019-05-14 DIAGNOSIS — F332 Major depressive disorder, recurrent severe without psychotic features: Secondary | ICD-10-CM | POA: Diagnosis not present

## 2019-05-14 DIAGNOSIS — K449 Diaphragmatic hernia without obstruction or gangrene: Secondary | ICD-10-CM

## 2019-05-14 DIAGNOSIS — K219 Gastro-esophageal reflux disease without esophagitis: Secondary | ICD-10-CM

## 2019-05-14 DIAGNOSIS — K5900 Constipation, unspecified: Secondary | ICD-10-CM

## 2019-05-14 DIAGNOSIS — I10 Essential (primary) hypertension: Secondary | ICD-10-CM

## 2019-05-14 DIAGNOSIS — Z20828 Contact with and (suspected) exposure to other viral communicable diseases: Secondary | ICD-10-CM | POA: Diagnosis not present

## 2019-05-14 NOTE — Progress Notes (Signed)
Location:   [facility] Room Number: 8 Place of Service:  ALF [id number]) Provider:  Roshan Salamon NP  Virgie Dad, MD  Patient Care Team: Virgie Dad, MD as PCP - General (Internal Medicine) Melina Modena, [facility] Clent Jacks, MD as Consulting Physician (Ophthalmology) Milus Banister, MD as Consulting Physician (Gastroenterology) Norma Fredrickson, MD as Consulting Physician (Psychiatry) Jessy Oto, MD as Consulting Physician (Orthopedic Surgery) Bjorn Loser, MD as Consulting Physician (Urology)  Extended Emergency Contact Information Primary Emergency Contact: [address] [place], [place] [address] Johnnette Litter of [place] Phone: [phone] Mobile Phone: [phone] Relation: Son  Code Status:  Full Code Goals of care: Advanced Directive information Advanced Directives 02/25/2019  Does Patient Have a Medical Advance Directive? Yes  Type of Advance Directive -  Does patient want to make changes to medical advance directive? -  Copy of [place] in Chart? -  Pre-existing out of facility DNR order (yellow form or pink MOST form) -     Chief Complaint  Patient presents with  . Medical Management of Chronic Issues  . Health Maintenance    Urine microalbumin, TDAP, and mammogram    HPI:  Pt is a 85 y.o. female seen today for medical management of chronic diseases.    The patient resides in [place] for safety, care assistance. Her mood is not well controlled, c/o anxiety, fearful, depression, not loved, escalated worries, f/u psych, on Wellbutrin 168m qd, Luvox 586mqd,  Clonopin 0,25ng qid prn, Zyprexa 32m36md. GERD, stable, on Pantoprazole 32m38m. Constipation, stable, on Colace qd, MiraLax qd. HTN, blood pressure is controlled, on Nifedipine 60mg6m   Past Medical History:  Diagnosis Date  . Abnormal mammogram, unspecified 04/19/2011  . Allergy   . Anxiety   . Backache, unspecified 01/21/2005  . Cervicalgia  08/20/2013  . Chronic kidney disease, stage II (mild) 04/03/2009  . Chronic rhinitis 09/28/2010  . DDD (degenerative disc disease)   . Depression   . Dermatophytosis of nail 01/21/2005  . Diverticulosis of colon (without mention of hemorrhage) 06/17/2005  . Edema 11/26/2004  . GERD (gastroesophageal reflux disease)   . Hiatal hernia   . High cholesterol   . Hyperglycemia 11/18/2014  . Hypertension   . Insomnia, unspecified 10/18/2011  . Left inguinal hernia 04/24/2014  . Nocturia 09/19/2008  . Osteoporosis, senile 11/18/2014  . Other abnormal blood chemistry 04/03/2007  . Pain in joint, pelvic region and thigh 11/12/2011  . Pain in joint, shoulder region 08/15/2006  . Pain in limb 08/26/2005  . Palpitations 09/29/2009  . PAOD (peripheral arterial occlusive disease) (HCC) [place]/2016   Right leg; diminished popliteal, dorsalis pedis, and posterior tibial artery pulsations   . Postmenopausal atrophic vaginitis 12/14/2004  . Scoliosis   . Senile osteoporosis 12/14/2004  . Tension headache 10/04/2005  . Unspecified cataract 11/26/2004  . Unspecified constipation 01/21/2005  . Unspecified urinary incontinence 11/26/2004  . Vaginal stenosis 11/18/2014   Tight band about 1/2 inches into the vagina which will not allow passage of my index finger.    Past Surgical History:  Procedure Laterality Date  . ABDOMINAL HYSTERECTOMY  1969   Dr.Braun   . bilateral eyelid surgery  2005   Dr.Scott  . BILATERAL OOPHORECTOMY  2002  . BREAST SURGERY     benign tumor removal, left breast 1969  . CATARACT EXTRACTION     Right  . CHOLECYSTECTOMY  1990  Dr.Moore   . COLONOSCOPY  3/18/20008   inflammation, diverticular associated colitis -Dr. Ardis Hughs  . COSMETIC SURGERY    . ESOPHAGOGASTRODUODENOSCOPY N/A 01/24/2014   Procedure: ESOPHAGOGASTRODUODENOSCOPY (EGD);  Surgeon: Irene Shipper, MD;  Location: [facility] END[facility]COPY;  Service: Endoscopy;  Laterality: N/A;  . ESOPHAGOGASTRODUODENOSCOPY ENDOSCOPY  04/04/14   Dr. Ardis Hughs  . EYE  SURGERY    . FOREIGN BODY REMOVAL N/A 01/24/2014   Procedure: FOREIGN BODY REMOVAL;  Surgeon: Irene Shipper, MD;  Location: [facility];  Service: Endoscopy;  Laterality: N/A;  . HIP PINNING,CANNULATED  11/07/2011   Procedure: CANNULATED HIP PINNING;  Surgeon: Jessy Oto, MD;  Location: [facility];  Service: Orthopedics;  Laterality: Left;  . ORIF FEMORAL NECK FRACTURE W/ [facility]  11/07/2011   Dr.Nitka   . TONSILLECTOMY     Dr.Joe Tamala Julian  . TUBAL LIGATION    . VAGINAL PROLAPSE REPAIR  2002   Dr.Horback     Allergies  Allergen Reactions  . Lamictal [Lamotrigine] Swelling    Lip swelling  . Penicillins Other (See Comments)    CHILDHOOD REACTION  . Abilify [Aripiprazole] Rash    Allergies as of 05/14/2019      Reactions   Lamictal [lamotrigine] Swelling   Lip swelling   Penicillins Other (See Comments)   CHILDHOOD REACTION   Abilify [aripiprazole] Rash      Medication List       Accurate as of May 14, 2019 11:59 PM. If you have any questions, ask your nurse or doctor.        STOP taking these medications   citalopram 20 MG tablet Commonly known as: CELEXA Stopped by: Katerine Morua X Schuyler Behan, NP   ibandronate 150 MG tablet Commonly known as: BONIVA Stopped by: Stone Spirito X Daniela Hernan, NP   mirtazapine 30 MG tablet Commonly known as: REMERON Stopped by: Jedd Schulenburg X Alejo Beamer, NP     TAKE these medications   acetaminophen-codeine 300-30 MG tablet Commonly known as: TYLENOL #3 Take 1 tablet by mouth every 6 (six) hours as needed for moderate pain.   Acetylcysteine 600 MG Caps Take 1 capsule by mouth daily.   antiseptic oral rinse Liqd 15 mLs by Mouth Rinse route every 4 (four) hours as needed for dry mouth. Resident may self administer and keep in room   aspirin 81 MG chewable tablet Chew 81 mg by mouth daily.   Biofreeze 4 % Gel Generic drug: Menthol (Topical Analgesic) Apply 1 application topically as needed. Resident may self administer and keep in room   buPROPion 150 MG 24 hr  tablet Commonly known as: WELLBUTRIN XL Take 150 mg by mouth daily. What changed: Another medication with the same name was removed. Continue taking this medication, and follow the directions you see here. Changed by: Sharryn Belding X Alya Smaltz, NP   Caltrate 600+D 600-800 MG-UNIT Tabs Generic drug: Calcium Carb-Cholecalciferol Take 1 tablet by mouth 2 (two) times daily.   cholecalciferol 1000 units tablet Commonly known as: VITAMIN D Take 1,000 Units by mouth daily.   clonazePAM 0.25 MG disintegrating tablet Commonly known as: KLONOPIN Take 1 tablet (0.25 mg total) by mouth 4 (four) times daily as needed for seizure.   docusate sodium 100 MG capsule Commonly known as: Colace Take 1 capsule (100 mg total) by mouth daily.   econazole nitrate 1 % cream Apply topically. apply once daily until till resolved, may self administer, may keep at bedside   fluvoxaMINE 25 MG tablet Commonly known as: LUVOX Take 50 mg by mouth  at bedtime.   Glucosamine HCl 1000 MG Tabs Take 2,000 mg by mouth 2 (two) times daily. 2 tabs   meloxicam 7.5 MG tablet Commonly known as: MOBIC TAKE 1 TABLET BY MOUTH EVERY DAY AS NEEDED FOR ARTHRITIS   miconazole 2 % powder Commonly known as: MICOTIN Apply topically as needed for itching. apply to legs and feet once a day   multivitamin with minerals Tabs tablet Take 1 tablet by mouth daily.   NIFEdipine 60 MG 24 hr tablet Commonly known as: PROCARDIA XL/NIFEDICAL XL TAKE 1 TABLET BY MOUTH EVERY DAY TO CONTROL BLOOD PRESSURE   OcuSoft Eyelid Cleansing Pads Apply 1 application topically daily.   OLANZapine 5 MG tablet Commonly known as: ZyPREXA Take 1 tablet (5 mg total) by mouth at bedtime.   pantoprazole 40 MG tablet Commonly known as: PROTONIX One daily to reduce stomach acid   PEPTO-BISMOL PO Take 30 mg by mouth as needed. For diarrhea   polyethylene glycol 17 g packet Commonly known as: MIRALAX / GLYCOLAX Take 17 g by mouth daily.   pravastatin 10 MG  tablet Commonly known as: PRAVACHOL Take 10 mg by mouth daily.   PRESERVISION AREDS 2+MULTI VIT PO Take 1 tablet by mouth 2 (two) times daily.   Systane 0.4-0.3 % Soln Generic drug: Polyethyl Glycol-Propyl Glycol Place 1 drop into both eyes. Can also apply  one drop to both eyes BID-PRN,Apply one drop to both eyes QID   Tussionex Pennkinetic ER 10-8 MG/5ML Suer Generic drug: chlorpheniramine-HYDROcodone Take 5 mLs by mouth every 12 (twelve) hours as needed for cough.       Review of Systems  Constitutional: Negative for activity change, appetite change, chills, diaphoresis, fatigue, fever and unexpected weight change.  HENT: Positive for hearing loss. Negative for congestion and voice change.   Eyes: Negative for visual disturbance.  Respiratory: Negative for cough, shortness of breath and wheezing.   Cardiovascular: Positive for leg swelling. Negative for chest pain and palpitations.  Gastrointestinal: Negative for abdominal distention, abdominal pain, constipation, diarrhea, nausea and vomiting.  Genitourinary: Negative for difficulty urinating, dysuria and urgency.  Musculoskeletal: Positive for arthralgias, back pain and gait problem.  Skin: Negative for color change and pallor.  Neurological: Negative for dizziness, speech difficulty, weakness and headaches.  Psychiatric/Behavioral: Positive for dysphoric mood. Negative for agitation, behavioral problems, hallucinations and sleep disturbance. The patient is nervous/anxious.     Immunization History  Administered Date(s) Administered  . Influenza Whole 02/14/2012, 02/14/2013  . Influenza, High Dose Seasonal PF 03/14/2017  . Influenza-Unspecified 02/27/2014, 02/12/2015, 02/25/2016, 03/15/2017, 02/19/2018  . Pneumococcal Conjugate-13 07/13/2015  . Pneumococcal Polysaccharide-23 05/16/1998  . Td 05/16/2004  . Zoster 05/16/2005  . Zoster Recombinat (Shingrix) 02/20/2018, 05/11/2018   Pertinent  Health Maintenance Due   Topic Date Due  . URINE MICROALBUMIN  07/27/1935  . MAMMOGRAM  12/20/2017  . INFLUENZA VACCINE  Completed  . DEXA SCAN  Completed  . PNA vac Low Risk Adult  Completed   Fall Risk  04/09/2019 04/25/2018 04/05/2018 04/21/2017 10/17/2016  Falls in the past year? 0 0 0 No No  Comment Emmi Telephone Survey: data to providers prior to load - Emmi Telephone Survey: data to providers prior to load - -  Number falls in past yr: - 0 - - -  Injury with Fall? - 0 - - -   Functional Status Survey:    Vitals:   05/14/19 1455  BP: 122/68  Pulse: 74  Resp: 17  Temp: 97.7 F (36.5  C)  SpO2: 97%  Weight: 144 lb 12.8 oz (65.7 kg)  Height: 5' 2"  (1.575 m)   Body mass index is 26.48 kg/m. Physical Exam Vitals and nursing note reviewed.  Constitutional:      General: She is not in acute distress.    Appearance: Normal appearance. She is not ill-appearing or diaphoretic.     Comments: Over weight  HENT:     Head: Normocephalic and atraumatic.     Nose: Nose normal.     Mouth/Throat:     Mouth: Mucous membranes are moist.  Eyes:     Extraocular Movements: Extraocular movements intact.     Conjunctiva/sclera: Conjunctivae normal.     Pupils: Pupils are equal, round, and reactive to light.  Cardiovascular:     Rate and Rhythm: Normal rate and regular rhythm.     Heart sounds: Murmur present.  Pulmonary:     Breath sounds: No wheezing, rhonchi or rales.  Abdominal:     General: Bowel sounds are normal. There is no distension.     Palpations: Abdomen is soft.     Tenderness: There is no abdominal tenderness. There is no right CVA tenderness, left CVA tenderness, guarding or rebound.  Musculoskeletal:     Cervical back: Normal range of motion and neck supple.     Right lower leg: Edema present.     Left lower leg: Edema present.     Comments: Trace edema BLE  Skin:    General: Skin is warm and dry.  Neurological:     General: No focal deficit present.     Mental Status: She is alert  and oriented to person, place, and time. Mental status is at baseline.     Motor: No weakness.     Coordination: Coordination normal.     Gait: Gait abnormal.  Psychiatric:     Comments: Anxiety, depression     Labs reviewed: Recent Labs    07/09/18 0000 09/21/18 0000  NA 141 138  K 4.2 4.3  BUN 21 21  CREATININE 0.8 0.9   Recent Labs    07/09/18 0000  AST 18  ALT 12  ALKPHOS 60   Recent Labs    07/09/18 0000 10/08/18 0000  WBC 6.6  --   HGB 13.3 13.2  HCT 40 40  PLT 241 245   Lab Results  Component Value Date   TSH 1.42 09/28/2018   Lab Results  Component Value Date   HGBA1C 5.5 09/28/2018   Lab Results  Component Value Date   CHOL 150 11/08/2018   HDL 47 11/08/2018   LDLCALC 84 11/08/2018   TRIG 99 11/08/2018   CHOLHDL 3.5 08/04/2017    Significant Diagnostic Results in last 30 days:  No results found.  Assessment/Plan Severe episode of recurrent major depressive disorder, without psychotic features ([place]) Her mood is not well controlled, c/o anxiety, fearful, depression, not loved, escalated worries, f/u psych, on Wellbutrin 111m qd, Luvox 564mqd,  Clonopin 0,25ng qid prn, Zyprexa 33m26md. Update CBC/diff, CMP/eGFR  Hiatal hernia Stable, continue Protonix 90m42m.   GERD (gastroesophageal reflux disease) Stable, continue Protonix   Constipation Stable, continue Colace qd, MiraLax qd.   Hypertension Blood pressure is controlled, continue Nifedipine 60mg16m      Family/ staff Communication: plan of care reviewed with the patient and charge nurse.   Labs/tests ordered: CBC/diff, CMP/eGFR  Time spend 40 minutes.

## 2019-05-16 ENCOUNTER — Encounter: Payer: Self-pay | Admitting: Nurse Practitioner

## 2019-05-16 DIAGNOSIS — K5901 Slow transit constipation: Secondary | ICD-10-CM | POA: Insufficient documentation

## 2019-05-16 DIAGNOSIS — K5900 Constipation, unspecified: Secondary | ICD-10-CM | POA: Insufficient documentation

## 2019-05-16 NOTE — Assessment & Plan Note (Signed)
Blood pressure is controlled, continue Nifedipine 60mg  qd.

## 2019-05-16 NOTE — Assessment & Plan Note (Signed)
Her mood is not well controlled, c/o anxiety, fearful, depression, not loved, escalated worries, f/u psych, on Wellbutrin 173m qd, Luvox 522mqd,  Clonopin 0,25ng qid prn, Zyprexa 60m11md. Update CBC/diff, CMP/eGFR

## 2019-05-16 NOTE — Assessment & Plan Note (Signed)
Stable, continue Protonix 

## 2019-05-16 NOTE — Assessment & Plan Note (Signed)
Stable, continue Protonix 40mg qd.  

## 2019-05-16 NOTE — Assessment & Plan Note (Signed)
Stable, continue Colace qd, MiraLax qd.

## 2019-05-20 DIAGNOSIS — D649 Anemia, unspecified: Secondary | ICD-10-CM | POA: Diagnosis not present

## 2019-05-20 DIAGNOSIS — Z23 Encounter for immunization: Secondary | ICD-10-CM | POA: Diagnosis not present

## 2019-05-20 DIAGNOSIS — F0390 Unspecified dementia without behavioral disturbance: Secondary | ICD-10-CM | POA: Diagnosis not present

## 2019-05-20 LAB — CBC AND DIFFERENTIAL
HCT: 38 (ref 36–46)
Hemoglobin: 12.5 (ref 12.0–16.0)
Neutrophils Absolute: 3855
Platelets: 212 (ref 150–399)
WBC: 6.5

## 2019-05-20 LAB — HEPATIC FUNCTION PANEL
ALT: 15 (ref 7–35)
AST: 18 (ref 13–35)
Alkaline Phosphatase: 60 (ref 25–125)
Bilirubin, Total: 0.3

## 2019-05-20 LAB — BASIC METABOLIC PANEL
BUN: 20 (ref 4–21)
CO2: 28 — AB (ref 13–22)
Chloride: 104 (ref 99–108)
Creatinine: 0.8 (ref 0.5–1.1)
Glucose: 97
Potassium: 4.4 (ref 3.4–5.3)
Sodium: 140 (ref 137–147)

## 2019-05-20 LAB — COMPREHENSIVE METABOLIC PANEL
Albumin: 3.9 (ref 3.5–5.0)
Calcium: 10.2 (ref 8.7–10.7)
Globulin: 2.3

## 2019-05-20 LAB — CBC: RBC: 4.22 (ref 3.87–5.11)

## 2019-05-30 DIAGNOSIS — F411 Generalized anxiety disorder: Secondary | ICD-10-CM | POA: Diagnosis not present

## 2019-06-11 DIAGNOSIS — H04123 Dry eye syndrome of bilateral lacrimal glands: Secondary | ICD-10-CM | POA: Diagnosis not present

## 2019-06-11 DIAGNOSIS — Z961 Presence of intraocular lens: Secondary | ICD-10-CM | POA: Diagnosis not present

## 2019-06-11 DIAGNOSIS — H40053 Ocular hypertension, bilateral: Secondary | ICD-10-CM | POA: Diagnosis not present

## 2019-06-11 DIAGNOSIS — H353131 Nonexudative age-related macular degeneration, bilateral, early dry stage: Secondary | ICD-10-CM | POA: Diagnosis not present

## 2019-06-13 DIAGNOSIS — F411 Generalized anxiety disorder: Secondary | ICD-10-CM | POA: Diagnosis not present

## 2019-06-17 DIAGNOSIS — Z23 Encounter for immunization: Secondary | ICD-10-CM | POA: Diagnosis not present

## 2019-06-21 DIAGNOSIS — F411 Generalized anxiety disorder: Secondary | ICD-10-CM | POA: Diagnosis not present

## 2019-07-10 DIAGNOSIS — F411 Generalized anxiety disorder: Secondary | ICD-10-CM | POA: Diagnosis not present

## 2019-07-15 ENCOUNTER — Other Ambulatory Visit: Payer: Self-pay | Admitting: *Deleted

## 2019-07-15 DIAGNOSIS — F411 Generalized anxiety disorder: Secondary | ICD-10-CM

## 2019-07-15 MED ORDER — CLONAZEPAM 0.25 MG PO TBDP: 0.2500 mg | ORAL_TABLET | Freq: Four times a day (QID) | ORAL | 1 refills | Status: DC | PRN

## 2019-07-15 NOTE — Telephone Encounter (Signed)
Received Refill Request from [facility] Rx and sent to [facility] for approval (Dr. Lyndel Safe out of office)

## 2019-07-17 DIAGNOSIS — R41841 Cognitive communication deficit: Secondary | ICD-10-CM | POA: Diagnosis not present

## 2019-07-17 DIAGNOSIS — M1990 Unspecified osteoarthritis, unspecified site: Secondary | ICD-10-CM | POA: Diagnosis not present

## 2019-07-18 DIAGNOSIS — M1990 Unspecified osteoarthritis, unspecified site: Secondary | ICD-10-CM | POA: Diagnosis not present

## 2019-07-18 DIAGNOSIS — R41841 Cognitive communication deficit: Secondary | ICD-10-CM | POA: Diagnosis not present

## 2019-07-19 DIAGNOSIS — M1990 Unspecified osteoarthritis, unspecified site: Secondary | ICD-10-CM | POA: Diagnosis not present

## 2019-07-19 DIAGNOSIS — R41841 Cognitive communication deficit: Secondary | ICD-10-CM | POA: Diagnosis not present

## 2019-07-22 DIAGNOSIS — M1990 Unspecified osteoarthritis, unspecified site: Secondary | ICD-10-CM | POA: Diagnosis not present

## 2019-07-22 DIAGNOSIS — R41841 Cognitive communication deficit: Secondary | ICD-10-CM | POA: Diagnosis not present

## 2019-07-23 DIAGNOSIS — R41841 Cognitive communication deficit: Secondary | ICD-10-CM | POA: Diagnosis not present

## 2019-07-23 DIAGNOSIS — M1990 Unspecified osteoarthritis, unspecified site: Secondary | ICD-10-CM | POA: Diagnosis not present

## 2019-07-24 DIAGNOSIS — R41841 Cognitive communication deficit: Secondary | ICD-10-CM | POA: Diagnosis not present

## 2019-07-24 DIAGNOSIS — M1990 Unspecified osteoarthritis, unspecified site: Secondary | ICD-10-CM | POA: Diagnosis not present

## 2019-07-25 DIAGNOSIS — R41841 Cognitive communication deficit: Secondary | ICD-10-CM | POA: Diagnosis not present

## 2019-07-25 DIAGNOSIS — F411 Generalized anxiety disorder: Secondary | ICD-10-CM | POA: Diagnosis not present

## 2019-07-25 DIAGNOSIS — M1990 Unspecified osteoarthritis, unspecified site: Secondary | ICD-10-CM | POA: Diagnosis not present

## 2019-07-26 DIAGNOSIS — R41841 Cognitive communication deficit: Secondary | ICD-10-CM | POA: Diagnosis not present

## 2019-07-26 DIAGNOSIS — M1990 Unspecified osteoarthritis, unspecified site: Secondary | ICD-10-CM | POA: Diagnosis not present

## 2019-07-29 DIAGNOSIS — M1990 Unspecified osteoarthritis, unspecified site: Secondary | ICD-10-CM | POA: Diagnosis not present

## 2019-07-29 DIAGNOSIS — R41841 Cognitive communication deficit: Secondary | ICD-10-CM | POA: Diagnosis not present

## 2019-07-30 DIAGNOSIS — M1990 Unspecified osteoarthritis, unspecified site: Secondary | ICD-10-CM | POA: Diagnosis not present

## 2019-07-30 DIAGNOSIS — R41841 Cognitive communication deficit: Secondary | ICD-10-CM | POA: Diagnosis not present

## 2019-08-02 DIAGNOSIS — M1990 Unspecified osteoarthritis, unspecified site: Secondary | ICD-10-CM | POA: Diagnosis not present

## 2019-08-02 DIAGNOSIS — R41841 Cognitive communication deficit: Secondary | ICD-10-CM | POA: Diagnosis not present

## 2019-08-08 ENCOUNTER — Encounter: Payer: Self-pay | Admitting: Internal Medicine

## 2019-08-08 ENCOUNTER — Non-Acute Institutional Stay: Payer: Medicare Other | Admitting: Internal Medicine

## 2019-08-08 DIAGNOSIS — M810 Age-related osteoporosis without current pathological fracture: Secondary | ICD-10-CM

## 2019-08-08 DIAGNOSIS — E785 Hyperlipidemia, unspecified: Secondary | ICD-10-CM | POA: Diagnosis not present

## 2019-08-08 DIAGNOSIS — M545 Low back pain: Secondary | ICD-10-CM

## 2019-08-08 DIAGNOSIS — R600 Localized edema: Secondary | ICD-10-CM | POA: Diagnosis not present

## 2019-08-08 DIAGNOSIS — G8929 Other chronic pain: Secondary | ICD-10-CM | POA: Diagnosis not present

## 2019-08-08 DIAGNOSIS — I10 Essential (primary) hypertension: Secondary | ICD-10-CM | POA: Diagnosis not present

## 2019-08-08 DIAGNOSIS — F339 Major depressive disorder, recurrent, unspecified: Secondary | ICD-10-CM

## 2019-08-08 NOTE — Progress Notes (Signed)
Location:   [place] Room Number: 8 Place of Service:  ALF [phone]) Provider:  Virgie Dad, MD  Virgie Dad, MD  Patient Care Team: Virgie Dad, MD as PCP - General (Internal Medicine) Melina Modena, [facility] Clent Jacks, MD as Consulting Physician (Ophthalmology) Milus Banister, MD as Consulting Physician (Gastroenterology) Norma Fredrickson, MD as Consulting Physician (Psychiatry) Jessy Oto, MD as Consulting Physician (Orthopedic Surgery) Bjorn Loser, MD as Consulting Physician (Urology)  Extended Emergency Contact Information Primary Emergency Contact: Miesha, [address] [place], [place] [address] Johnnette Litter of [place] Phone: [phone] Mobile Phone: [phone] Relation: Son  Code Status:  Full Code Goals of care: Advanced Directive information Advanced Directives 08/08/2019  Does Patient Have a Medical Advance Directive? Yes  Type of Advance Directive Living will;Healthcare Power of Attorney  Does patient want to make changes to medical advance directive? No - Patient declined  Copy of [place] in Chart? Yes - validated most recent copy scanned in chart (See row information)  Pre-existing out of facility DNR order (yellow form or pink MOST form) -     Chief Complaint  Patient presents with  . Medical Management of Chronic Issues  . Health Maintenance    Urine microalbumin, TDAP, Mammogram    HPI:  Pt is a 86 y.o. female seen today for medical management of chronic diseases.    Patient has h/o Major Depression with Anxiety, Hyperlipidemia, Chronic Bilateral Low Back Pain, Spinal Stenosis , Osteoarthritis, history of osteoporosis, hypertension Patient is a long[place]term resident [place]. Has kyphosis but walks with a walker.  Has not had any falls.  Is independent in her ADLs. Her main problem is anxiety and depression.  She gets close follow-up with her psychiatrist.  Recently she has been doing much  better and thinks that these medication combination is really working for her. Her only complaint today was mild lower extremity edema.  She states she notices it mostly by end of the day.  Denies any shortness of breath or dyspnea on exertion She has lost some weight but overall she is doing well and has no new nursing issues Past Medical History:  Diagnosis Date  . Abnormal mammogram, unspecified 04/19/2011  . Allergy   . Anxiety   . Backache, unspecified 01/21/2005  . Cervicalgia 08/20/2013  . Chronic kidney disease, stage II (mild) 04/03/2009  . Chronic rhinitis 09/28/2010  . DDD (degenerative disc disease)   . Depression   . Dermatophytosis of nail 01/21/2005  . Diverticulosis of colon (without mention of hemorrhage) 06/17/2005  . Edema 11/26/2004  . GERD (gastroesophageal reflux disease)   . Hiatal hernia   . High cholesterol   . Hyperglycemia 11/18/2014  . Hypertension   . Insomnia, unspecified 10/18/2011  . Left inguinal hernia 04/24/2014  . Nocturia 09/19/2008  . Osteoporosis, senile 11/18/2014  . Other abnormal blood chemistry 04/03/2007  . Pain in joint, pelvic region and thigh 11/12/2011  . Pain in joint, shoulder region 08/15/2006  . Pain in limb 08/26/2005  . Palpitations 09/29/2009  . PAOD (peripheral arterial occlusive disease) ([place]) 11/18/2014   Right leg; diminished popliteal, dorsalis pedis, and posterior tibial artery pulsations   . Postmenopausal atrophic vaginitis 12/14/2004  . Scoliosis   . Senile osteoporosis 12/14/2004  . Tension headache 10/04/2005  . Unspecified cataract 11/26/2004  . Unspecified constipation 01/21/2005  . Unspecified urinary incontinence 11/26/2004  . Vaginal stenosis 11/18/2014  Tight band about 1/2 inches into the vagina which will not allow passage of my index finger.    Past Surgical History:  Procedure Laterality Date  . ABDOMINAL HYSTERECTOMY  1969   Dr.Braun   . bilateral eyelid surgery  2005   Dr.Scott  . BILATERAL OOPHORECTOMY  2002  . BREAST  SURGERY     benign tumor removal, left breast 1969  . CATARACT EXTRACTION     Right  . CHOLECYSTECTOMY  1990   Dr.Moore   . COLONOSCOPY  3/18/20008   inflammation, diverticular associated colitis -Dr. Ardis Hughs  . COSMETIC SURGERY    . ESOPHAGOGASTRODUODENOSCOPY N/A 01/24/2014   Procedure: ESOPHAGOGASTRODUODENOSCOPY (EGD);  Surgeon: Irene Shipper, MD;  [facility] END[facility]COPY;  Service: Endoscopy;  Laterality: N/A;  . ESOPHAGOGASTRODUODENOSCOPY ENDOSCOPY  04/04/14   Dr. Ardis Hughs  . EYE SURGERY    . FOREIGN BODY REMOVAL N/A 01/24/2014   Procedure: FOREIGN BODY REMOVAL;  Surgeon: Irene Shipper, MD;  Location: [place];  Service: Endoscopy;  Laterality: N/A;  . HIP PINNING,CANNULATED  11/07/2011   Procedure: CANNULATED HIP PINNING;  Surgeon: Jessy Oto, MD;  Location: [facility];  Service: Orthopedics;  Laterality: Left;  . ORIF FEMORAL NECK FRACTURE W/ [facility]  11/07/2011   Dr.Nitka   . TONSILLECTOMY     Dr.Joe Tamala Julian  . TUBAL LIGATION    . VAGINAL PROLAPSE REPAIR  2002   Dr.Horback     Allergies  Allergen Reactions  . Lamictal [Lamotrigine] Swelling    Lip swelling  . Penicillins Other (See Comments)    CHILDHOOD REACTION  . Abilify [Aripiprazole] Rash    Allergies as of 08/08/2019      Reactions   Lamictal [lamotrigine] Swelling   Lip swelling   Penicillins Other (See Comments)   CHILDHOOD REACTION   Abilify [aripiprazole] Rash      Medication List       Accurate as of August 08, 2019  9:57 AM. If you have any questions, ask your nurse or doctor.        acetaminophen-codeine 300-30 MG tablet Commonly known as: TYLENOL #3 Take 1 tablet by mouth every 6 (six) hours as needed for moderate pain.   Acetylcysteine 600 MG Caps Take 1 capsule by mouth daily.   antiseptic oral rinse Liqd 15 mLs by Mouth Rinse route every 4 (four) hours as needed for dry mouth. Resident may self administer and keep in room   aspirin 81 MG chewable tablet Chew 81 mg by mouth daily.   Biofreeze  4 % Gel Generic drug: Menthol (Topical Analgesic) Apply 1 application topically as needed. Resident may self administer and keep in room   buPROPion 150 MG 24 hr tablet Commonly known as: WELLBUTRIN XL Take 150 mg by mouth daily.   Caltrate 600+D 600-800 MG-UNIT Tabs Generic drug: Calcium Carb-Cholecalciferol Take 1 tablet by mouth 2 (two) times daily.   cholecalciferol 1000 units tablet Commonly known as: VITAMIN D Take 1,000 Units by mouth daily.   clonazePAM 0.25 MG disintegrating tablet Commonly known as: KLONOPIN Take 1 tablet (0.25 mg total) by mouth 4 (four) times daily as needed for seizure.   docusate sodium 100 MG capsule Commonly known as: Colace Take 1 capsule (100 mg total) by mouth daily.   econazole nitrate 1 % cream Apply topically. apply once daily until till resolved, may self administer, may keep at bedside   fluvoxaMINE 25 MG tablet Commonly known as: LUVOX Take 50 mg by mouth at bedtime.  Glucosamine HCl 1000 MG Tabs Take 2,000 mg by mouth 2 (two) times daily. 2 tabs   meloxicam 7.5 MG tablet Commonly known as: MOBIC TAKE 1 TABLET BY MOUTH EVERY DAY AS NEEDED FOR ARTHRITIS   miconazole 2 % powder Commonly known as: MICOTIN Apply topically as needed for itching. apply to legs and feet once a day   multivitamin with minerals Tabs tablet Take 1 tablet by mouth daily.   NIFEdipine 60 MG 24 hr tablet Commonly known as: PROCARDIA XL/NIFEDICAL XL TAKE 1 TABLET BY MOUTH EVERY DAY TO CONTROL BLOOD PRESSURE   OcuSoft Eyelid Cleansing Pads Apply 1 application topically daily.   OLANZapine 5 MG tablet Commonly known as: ZyPREXA Take 1 tablet (5 mg total) by mouth at bedtime.   pantoprazole 40 MG tablet Commonly known as: PROTONIX One daily to reduce stomach acid   PEPTO-BISMOL PO Take 30 mg by mouth as needed. For diarrhea   polyethylene glycol 17 g packet Commonly known as: MIRALAX / GLYCOLAX Take 17 g by mouth daily.   pravastatin 10 MG  tablet Commonly known as: PRAVACHOL Take 10 mg by mouth daily.   PRESERVISION AREDS 2+MULTI VIT PO Take 1 tablet by mouth 2 (two) times daily.   Systane 0.4-0.3 % Soln Generic drug: Polyethyl Glycol-Propyl Glycol Place 1 drop into both eyes. Can also apply  one drop to both eyes QID-PRN,Apply one drop to both eyes QID   Tussionex Pennkinetic ER 10-8 MG/5ML Suer Generic drug: chlorpheniramine-HYDROcodone Take 5 mLs by mouth every 12 (twelve) hours as needed for cough.       Review of Systems  Constitutional: Negative.   HENT: Negative.   Respiratory: Negative.   Cardiovascular: Positive for leg swelling.  Gastrointestinal: Positive for constipation.  Genitourinary: Negative.   Musculoskeletal: Negative.   Neurological: Negative.   Psychiatric/Behavioral: Positive for dysphoric mood. The patient is nervous/anxious.   All other systems reviewed and are negative.   Immunization History  Administered Date(s) Administered  . Influenza Whole 02/14/2012, 02/14/2013  . Influenza, High Dose Seasonal PF 03/14/2017, 02/26/2019  . Influenza-Unspecified 02/27/2014, 02/12/2015, 02/25/2016, 03/15/2017, 02/19/2018  . Moderna SARS-COVID-2 Vaccination 05/20/2019, 06/17/2019  . Pneumococcal Conjugate-13 07/13/2015  . Pneumococcal Polysaccharide-23 05/16/1998  . Td 05/16/2004  . Zoster 05/16/2005  . Zoster Recombinat (Shingrix) 02/20/2018, 05/11/2018, 07/16/2018   Pertinent  Health Maintenance Due  Topic Date Due  . URINE MICROALBUMIN  Never done  . MAMMOGRAM  12/20/2017  . INFLUENZA VACCINE  Completed  . DEXA SCAN  Completed  . PNA vac Low Risk Adult  Completed   Fall Risk  04/09/2019 04/25/2018 04/05/2018 04/21/2017 10/17/2016  Falls in the past year? 0 0 0 No No  Comment Emmi Telephone Survey: data to providers prior to load - Emmi Telephone Survey: data to providers prior to load - -  Number falls in past yr: - 0 - - -  Injury with Fall? - 0 - - -   Functional Status Survey:     Vitals:   08/08/19 0947  BP: 116/71  Pulse: 80  Resp: 20  Temp: 97.7 F (36.5 C)  SpO2: 92%  Weight: 142 lb 3.2 oz (64.5 kg)  Height: 5\' 2"  (1.575 m)   Body mass index is 26.01 kg/m. Physical Exam Vitals reviewed.  Constitutional:      Appearance: Normal appearance.  HENT:     Head: Normocephalic.     Nose: Nose normal.     Mouth/Throat:     Mouth: Mucous membranes are moist.  Pharynx: Oropharynx is clear.  Eyes:     Pupils: Pupils are equal, round, and reactive to light.  Cardiovascular:     Rate and Rhythm: Normal rate.     Pulses: Normal pulses.     Heart sounds: Murmur present.  Pulmonary:     Effort: Pulmonary effort is normal. No respiratory distress.     Breath sounds: No rales.  Abdominal:     General: Abdomen is flat. Bowel sounds are normal.     Palpations: Abdomen is soft.  Musculoskeletal:     Cervical back: Neck supple.     Comments: Mild Swelling Bilateral  Skin:    General: Skin is warm.  Neurological:     General: No focal deficit present.     Mental Status: She is alert and oriented to person, place, and time.  Psychiatric:        Mood and Affect: Mood normal.        Thought Content: Thought content normal.     Labs reviewed: Recent Labs    09/21/18 0000 05/20/19 0000  NA 138 140  K 4.3 4.4  CL  --  104  CO2  --  28*  BUN 21 20  CREATININE 0.9 0.8  CALCIUM  --  10.2   Recent Labs    05/20/19 0000  AST 18  ALT 15  ALKPHOS 60  ALBUMIN 3.9   Recent Labs    10/08/18 0000 05/20/19 0000  WBC  --  6.5  NEUTROABS  --  3,855  HGB 13.2 12.5  HCT 40 38  PLT 245 212   Lab Results  Component Value Date   TSH 1.42 09/28/2018   Lab Results  Component Value Date   HGBA1C 5.5 09/28/2018   Lab Results  Component Value Date   CHOL 150 11/08/2018   HDL 47 11/08/2018   LDLCALC 84 11/08/2018   TRIG 99 11/08/2018   CHOLHDL 3.5 08/04/2017    Significant Diagnostic Results in last 30 days:  No results  found.  Assessment/Plan  Essential hypertension BP stable on Procardia  Hyperlipidemia LDL goal <100 On Prevachol  Major depression, recurrent, chronic (HCC) Doing well on Luvox, Zyprexa and Wellbutrin Also On Klonopin PRN Per Psych Services  Chronic bilateral low back pain without sciatica Walking with therapy  Pain Controlled on Meloxicam Age-related osteoporosis without current pathological fracture Patient has not taken any Boniva in the past few months Her last T score in 07/05/2016 was -2.6 was seen by Dr.Deveyeshwar   she refused Prolia and other medicines  Leg edema Mild Just Do Ted Hoses For Now GERD On Protonix   Family/ staff Communication:   Labs/tests ordered:

## 2019-08-22 ENCOUNTER — Ambulatory Visit (INDEPENDENT_AMBULATORY_CARE_PROVIDER_SITE_OTHER): Payer: Medicare Other | Admitting: Adult Health

## 2019-08-22 ENCOUNTER — Encounter: Payer: Self-pay | Admitting: Adult Health

## 2019-08-22 DIAGNOSIS — F429 Obsessive-compulsive disorder, unspecified: Secondary | ICD-10-CM | POA: Diagnosis not present

## 2019-08-22 DIAGNOSIS — F331 Major depressive disorder, recurrent, moderate: Secondary | ICD-10-CM | POA: Diagnosis not present

## 2019-08-22 DIAGNOSIS — F411 Generalized anxiety disorder: Secondary | ICD-10-CM

## 2019-08-22 DIAGNOSIS — G4700 Insomnia, unspecified: Secondary | ICD-10-CM | POA: Diagnosis not present

## 2019-08-22 MED ORDER — FLUVOXAMINE MALEATE 100 MG PO TABS: 100.0000 mg | ORAL_TABLET | Freq: Every day | ORAL | 3 refills | Status: DC

## 2019-08-22 NOTE — Progress Notes (Signed)
Tina Hampton [id number] 10/22/1925 86 y.o.  Virtual Visit via Telephone Note  I connected with pt on 08/22/19 at  8:20 AM EDT by telephone and verified that I am speaking with the correct person using two identifiers.   I discussed the limitations, risks, security and privacy concerns of performing an evaluation and management service by telephone and the availability of in person appointments. I also discussed with the patient that there may be a patient responsible charge related to this service. The patient expressed understanding and agreed to proceed.   I discussed the assessment and treatment plan with the patient. The patient was provided an opportunity to ask questions and all were answered. The patient agreed with the plan and demonstrated an understanding of the instructions.   The patient was advised to call back or seek an in-person evaluation if the symptoms worsen or if the condition fails to improve as anticipated.  I provided 30 minutes of non-face-to-face time during this encounter.  The patient was located at home.  The provider was located at [place].   Aloha Gell, NP   Subjective:   Patient ID:  Tina Hampton is a 86 y.o. (DOB [birth year]) female.  Chief Complaint: No chief complaint on file.   HPI Tina Hampton presents for follow-up of OCD, anxiety, depression, and insomnia.  Describes mood today as "ok". Pleasant. Mood symptoms - reports some depression, anxiety, and irritability. States "I've been doing alright up until here lately". Also stating "I thought I would get in touch with you so we can head this off at the pass". Feels like Luvox has been "very helpful", but needs to be increased a "little more".  Talking with therapist regularly - [facility]. Interacting with staff and residents. Stable interest and motivation. Taking medications as prescribed.  Energy levels stable. Active, has a regular exercise routine. Working  with P/T. Enjoys some usual interests and activities. Widowed. Lives [facility]. Talking with family and friends. Getting out of room. Appetite adequate. Weight stable. Sleeps well most nights. Averages 6 to 8 hours.  Focus and concentration stable. Completing tasks. Managing aspects of household.  Denies SI or HI. Denies AH or VH.  Multiple medication trials and failures: Latuda, Abilify, Rexulti, Effexor, Prozac, Buspar, Lamictal, Lexapro, Trintellix, Lorazepam, and others.   Review of Systems:  Review of Systems  Musculoskeletal: Negative for gait problem.  Neurological: Negative for tremors.  Psychiatric/Behavioral:       Please refer to HPI    Medications: I have reviewed the patient's current medications.  Current Outpatient Medications  Medication Sig Dispense Refill  . acetaminophen-codeine (TYLENOL #3) 300-30 MG tablet Take 1 tablet by mouth every 6 (six) hours as needed for moderate pain.    . Acetylcysteine 600 MG CAPS Take 1 capsule by mouth daily.    Marland Kitchen antiseptic oral rinse (BIOTENE) LIQD 15 mLs by Mouth Rinse route every 4 (four) hours as needed for dry mouth. Resident may self administer and keep in room    . aspirin 81 MG chewable tablet Chew 81 mg by mouth daily.    . Bismuth Subsalicylate (PEPTO-BISMOL PO) Take 30 mg by mouth as needed. For diarrhea    . buPROPion (WELLBUTRIN XL) 150 MG 24 hr tablet Take 150 mg by mouth daily.    . Calcium Carb-Cholecalciferol (CALTRATE 600+D) 600-800 MG-UNIT TABS Take 1 tablet by mouth 2 (two) times daily.    . chlorpheniramine-HYDROcodone (TUSSIONEX PENNKINETIC ER) 10-8 MG/5ML SUER Take 5 mLs by mouth  every 12 (twelve) hours as needed for cough.    . cholecalciferol (VITAMIN D) 1000 UNITS tablet Take 1,000 Units by mouth daily.    . clonazePAM (KLONOPIN) 0.25 MG disintegrating tablet Take 1 tablet (0.25 mg total) by mouth 4 (four) times daily as needed for seizure. 120 tablet 1  . docusate sodium (COLACE) 100 MG capsule Take 1  capsule (100 mg total) by mouth daily. 30 capsule 0  . econazole nitrate 1 % cream Apply topically. apply once daily until till resolved, may self administer, may keep at bedside    . Eyelid Cleansers (OCUSOFT EYELID CLEANSING) PADS Apply 1 application topically daily.    . fluvoxaMINE (LUVOX) 100 MG tablet Take 1 tablet (100 mg total) by mouth at bedtime. 90 tablet 3  . Glucosamine HCl 1000 MG TABS Take 2,000 mg by mouth 2 (two) times daily. 2 tabs    . meloxicam (MOBIC) 7.5 MG tablet TAKE 1 TABLET BY MOUTH EVERY DAY AS NEEDED FOR ARTHRITIS 90 tablet 1  . Menthol, Topical Analgesic, (BIOFREEZE) 4 % GEL Apply 1 application topically as needed. Resident may self administer and keep in room    . miconazole (MICOTIN) 2 % powder Apply topically as needed for itching. apply to legs and feet once a day    . Multiple Vitamin (MULTIVITAMIN WITH MINERALS) TABS Take 1 tablet by mouth daily.    . Multiple Vitamins-Minerals (PRESERVISION AREDS 2+MULTI VIT PO) Take 1 tablet by mouth 2 (two) times daily.    Marland Kitchen NIFEdipine (PROCARDIA XL/ADALAT-CC) 60 MG 24 hr tablet TAKE 1 TABLET BY MOUTH EVERY DAY TO CONTROL BLOOD PRESSURE 90 tablet 3  . OLANZapine (ZYPREXA) 5 MG tablet Take 1 tablet (5 mg total) by mouth at bedtime. (Patient taking differently: Take 2.5 mg by mouth at bedtime. ) 30 tablet 2  . pantoprazole (PROTONIX) 40 MG tablet One daily to reduce stomach acid 90 tablet 3  . Polyethyl Glycol-Propyl Glycol (SYSTANE) 0.4-0.3 % SOLN Place 1 drop into both eyes. Can also apply  one drop to both eyes QID-PRN,Apply one drop to both eyes QID    . polyethylene glycol (MIRALAX / GLYCOLAX) packet Take 17 g by mouth daily.     . pravastatin (PRAVACHOL) 10 MG tablet Take 10 mg by mouth daily.     No current facility-administered medications for this visit.    Medication Side Effects: None  Allergies:  Allergies  Allergen Reactions  . Lamictal [Lamotrigine] Swelling    Lip swelling  . Penicillins Other (See  Comments)    CHILDHOOD REACTION  . Abilify [Aripiprazole] Rash    Past Medical History:  Diagnosis Date  . Abnormal mammogram, unspecified 04/19/2011  . Allergy   . Anxiety   . Backache, unspecified 01/21/2005  . Cervicalgia 08/20/2013  . Chronic kidney disease, stage II (mild) 04/03/2009  . Chronic rhinitis 09/28/2010  . DDD (degenerative disc disease)   . Depression   . Dermatophytosis of nail 01/21/2005  . Diverticulosis of colon (without mention of hemorrhage) 06/17/2005  . Edema 11/26/2004  . GERD (gastroesophageal reflux disease)   . Hiatal hernia   . High cholesterol   . Hyperglycemia 11/18/2014  . Hypertension   . Insomnia, unspecified 10/18/2011  . Left inguinal hernia 04/24/2014  . Nocturia 09/19/2008  . Osteoporosis, senile 11/18/2014  . Other abnormal blood chemistry 04/03/2007  . Pain in joint, pelvic region and thigh 11/12/2011  . Pain in joint, shoulder region 08/15/2006  . Pain in limb 08/26/2005  . Palpitations  09/29/2009  . PAOD (peripheral arterial occlusive disease) ([place]) 11/18/2014   Right leg; diminished popliteal, dorsalis pedis, and posterior tibial artery pulsations   . Postmenopausal atrophic vaginitis 12/14/2004  . Scoliosis   . Senile osteoporosis 12/14/2004  . Tension headache 10/04/2005  . Unspecified cataract 11/26/2004  . Unspecified constipation 01/21/2005  . Unspecified urinary incontinence 11/26/2004  . Vaginal stenosis 11/18/2014   Tight band about 1/2 inches into the vagina which will not allow passage of my index finger.     Family History  Problem Relation Age of Onset  . Heart disease Mother   . Stroke Mother   . Emphysema Father   . Arthritis Father   . Alcohol abuse Brother   . Heart disease Son   . Leukemia Son   . Esophageal cancer Neg Hx   . Stomach cancer Neg Hx   . Colon cancer Neg Hx     Social History   Socioeconomic History  . Marital status: Widowed    Spouse name: Not on file  . Number of children: Not on file  . Years of education: Not  on file  . Highest education level: Not on file  Occupational History  . 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 Smoking status: Never Smoker  . Smokeless tobacco: Never Used  Substance and Sexual Activity  . Alcohol use: Not Currently    Alcohol/week: 0.0 standard drinks    Comment: one glass of wine in evening  . Drug use: No  . Sexual activity: Never  Other Topics Concern  . Not on file  Social History Narrative   Lives at [facility] since 04/14/2005   Widowed (husband expired 2014)   Has Living Will, POA, MOST   Exercise: water aerobic  5 times a week   Walks with walker   Never smoked   Drinks moderate amount of caffeinate beverages daily   Alcohol none      Social Determinants of Health   Financial Resource Strain:   . Difficulty of Paying Living Expenses:   Food Insecurity:   . Worried About Charity fundraiser in the Last Year:   . Arboriculturist in the Last Year:   Transportation Needs:   . Film/video editor (Medical):   Marland Kitchen Lack of Transportation (Non-Medical):   Physical Activity:   . Days of Exercise per Week:   . Minutes of Exercise per Session:   Stress:   . Feeling of Stress :   Social Connections:   . Frequency of Communication with Friends and Family:   . Frequency of Social Gatherings with Friends and Family:   . Attends Religious Services:   . Active Member of Clubs or Organizations:   . Attends Archivist Meetings:   Marland Kitchen Marital Status:   Intimate Partner Violence:   . Fear of Current or Ex-Partner:   . Emotionally Abused:   Marland Kitchen Physically Abused:   . Sexually Abused:     Past Medical History, Surgical history, Social history, and Family history were reviewed and updated as appropriate.   Please see review of systems for further details on the patient's review from today.   Objective:   Physical Exam:  There were no vitals taken for this visit.  Physical Exam Neurological:     Mental  Status: She is alert and oriented to person, place, and time.     Cranial Nerves: No dysarthria.  Psychiatric:  Attention and Perception: Attention and perception normal.        Mood and Affect: Mood is anxious and depressed.        Speech: Speech normal.        Behavior: Behavior is cooperative.        Thought Content: Thought content normal. Thought content is not paranoid or delusional. Thought content does not include homicidal or suicidal ideation. Thought content does not include homicidal or suicidal plan.        Cognition and Memory: Cognition and memory normal.        Judgment: Judgment normal.     Comments: Insight intact     Lab Review:     Component Value Date/Time   NA 140 05/20/2019 0000   NA 143 07/10/2017 0000   K 4.4 05/20/2019 0000   K 4.2 07/10/2017 0000   CL 104 05/20/2019 0000   CL 109 07/10/2017 0000   CO2 28 (A) 05/20/2019 0000   CO2 26 07/10/2017 0000   GLUCOSE 86 01/18/2018 0831   BUN 20 05/20/2019 0000   CREATININE 0.8 05/20/2019 0000   CREATININE 0.93 (H) 01/18/2018 0831   CALCIUM 10.2 05/20/2019 0000   CALCIUM 9.3 07/10/2017 0000   CALCIUM 9.3 07/10/2017 0000   PROT 6.5 01/18/2018 0831   PROT 5.8 07/10/2017 0000   PROT 5.8 07/10/2017 0000   ALBUMIN 3.9 05/20/2019 0000   ALBUMIN 3.5 07/10/2017 0000   ALBUMIN 3.5 07/10/2017 0000   AST 18 05/20/2019 0000   AST 18 07/10/2017 0000   ALT 15 05/20/2019 0000   ALT 13 07/10/2017 0000   ALKPHOS 60 05/20/2019 0000   ALKPHOS 60 07/10/2017 0000   BILITOT 0.3 01/18/2018 0831   BILITOT 0.2 07/10/2017 0000   GFRNONAA 54 (L) 01/18/2018 0831   GFRAA 63 01/18/2018 0831       Component Value Date/Time   WBC 6.5 05/20/2019 0000   WBC 5.1 08/04/2017 0000   RBC 4.22 05/20/2019 0000   HGB 12.5 05/20/2019 0000   HGB 11.6 07/10/2017 0000   HCT 38 05/20/2019 0000   HCT 34 07/10/2017 0000   PLT 212 05/20/2019 0000   MCV 88.4 08/04/2017 0000   MCV 92.3 04/20/2014 1530   MCH 30.2 08/04/2017 0000    MCHC 34.1 08/04/2017 0000   RDW 12.2 08/04/2017 0000   RDW 13.0 08/28/2013 0945   LYMPHSABS 1,363 02/20/2017 0750   LYMPHSABS 1.3 08/28/2013 0945   MONOABS 528 10/17/2016 1512   EOSABS 141 02/20/2017 0750   EOSABS 0.3 08/28/2013 0945   BASOSABS 32 02/20/2017 0750   BASOSABS 0.0 08/28/2013 0945    No results found for: POCLITH, LITHIUM   No results found for: PHENYTOIN, PHENOBARB, VALPROATE, CBMZ   .res Assessment: Plan:    Plan:  Continue: 1. Wellbutrin XL 150mg  - 1 daily 2. IncreaseLuvox 75mg  to 100mg  daily 3. Clonazepam 0.25 mg up to 4 times daily - taking 2 a day 4. Zyprexa 2.5mg  at hs for mood instability/OCD/anxiety.   Continue with therapy  RTC 4 weeks  Patient advised to contact office with any questions, adverse effects, or acute worsening in signs and symptoms.  Discussed potential benefits, risk, and side effects of benzodiazepines to include potential risk of tolerance and dependence, as well as possible drowsiness.  Advised patient not to drive if experiencing drowsiness and to take lowest possible effective dose to minimize risk of dependence and tolerance.  Discussed potential metabolic side effects associated with atypical antipsychotics, as well as  potential risk for movement side effects. Advised pt to contact office if movement side effects occur.     Diagnoses and all orders for this visit:  Obsessive-compulsive disorder, unspecified type -     fluvoxaMINE (LUVOX) 100 MG tablet; Take 1 tablet (100 mg total) by mouth at bedtime.  Insomnia, unspecified type -     fluvoxaMINE (LUVOX) 100 MG tablet; Take 1 tablet (100 mg total) by mouth at bedtime.  Generalized anxiety disorder -     fluvoxaMINE (LUVOX) 100 MG tablet; Take 1 tablet (100 mg total) by mouth at bedtime.  Major depressive disorder, recurrent episode, moderate (HCC) -     fluvoxaMINE (LUVOX) 100 MG tablet; Take 1 tablet (100 mg total) by mouth at bedtime.    Please see After Visit  Summary for patient specific instructions.  No future appointments.  No orders of the defined types were placed in this encounter.     -------------------------------

## 2019-09-06 DIAGNOSIS — F411 Generalized anxiety disorder: Secondary | ICD-10-CM | POA: Diagnosis not present

## 2019-09-17 DIAGNOSIS — F411 Generalized anxiety disorder: Secondary | ICD-10-CM | POA: Diagnosis not present

## 2019-09-18 ENCOUNTER — Other Ambulatory Visit: Payer: Self-pay | Admitting: Adult Health

## 2019-09-18 DIAGNOSIS — F429 Obsessive-compulsive disorder, unspecified: Secondary | ICD-10-CM

## 2019-09-18 MED ORDER — FLUVOXAMINE MALEATE 25 MG PO TABS: 25.0000 mg | ORAL_TABLET | Freq: Every day | ORAL | 1 refills | Status: DC

## 2019-09-25 ENCOUNTER — Telehealth: Payer: Self-pay | Admitting: Adult Health

## 2019-09-25 NOTE — Telephone Encounter (Signed)
Pt left message that she thinks that she is having a nervous breakdown. Pt said that she has been feeling real nervous lately and would like to know what to do.

## 2019-09-25 NOTE — Telephone Encounter (Signed)
Called and spoke with patient. Increased anxiety earlier today. Feels better now after speaking with a staff member and taking a Clonazepam. Listening to music and thinking "happy" thoughts. Luvox increased from 100mg  to 125mg  daily x 1 week ago. Plans to talk with therapist next week.

## 2019-09-25 NOTE — Telephone Encounter (Signed)
I will cal her.

## 2019-09-25 NOTE — Telephone Encounter (Signed)
Reviewed thank you 

## 2019-10-01 NOTE — Telephone Encounter (Signed)
Called and spoke with patient. Patient does not feel like medication is working for her. Feels overwhelmed - tired, worried, "never going to get better". Reduce Luvox 125mg  to 100mg  daily. Will plan to decrease dose and taper off of Luvox.

## 2019-10-03 ENCOUNTER — Other Ambulatory Visit: Payer: Self-pay | Admitting: Adult Health

## 2019-10-03 DIAGNOSIS — F411 Generalized anxiety disorder: Secondary | ICD-10-CM | POA: Diagnosis not present

## 2019-10-03 MED ORDER — LORAZEPAM 0.5 MG PO TABS: ORAL_TABLET | ORAL | 2 refills | Status: DC

## 2019-10-07 ENCOUNTER — Telehealth: Payer: Self-pay | Admitting: Adult Health

## 2019-10-07 DIAGNOSIS — F331 Major depressive disorder, recurrent, moderate: Secondary | ICD-10-CM

## 2019-10-07 DIAGNOSIS — F411 Generalized anxiety disorder: Secondary | ICD-10-CM

## 2019-10-07 DIAGNOSIS — F429 Obsessive-compulsive disorder, unspecified: Secondary | ICD-10-CM

## 2019-10-07 MED ORDER — OLANZAPINE 5 MG PO TABS: 5.0000 mg | ORAL_TABLET | Freq: Every day | ORAL | 2 refills | Status: DC

## 2019-10-07 NOTE — Telephone Encounter (Signed)
Spoke with patient's son about increasing the Zyprexa from 2.5mg  to 5mg  at bedtime.

## 2019-10-07 NOTE — Telephone Encounter (Signed)
Pt left message that she would like a increase in her medication. Please call.

## 2019-10-07 NOTE — Telephone Encounter (Signed)
Called and spoke with patient.

## 2019-10-10 ENCOUNTER — Telehealth: Payer: Self-pay | Admitting: Adult Health

## 2019-10-10 DIAGNOSIS — F411 Generalized anxiety disorder: Secondary | ICD-10-CM

## 2019-10-10 NOTE — Telephone Encounter (Signed)
Called and spoke with patient.

## 2019-10-10 NOTE — Telephone Encounter (Signed)
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 More uptight right now than she has been her whole life.  Very shaky.  She wants to know if she can increase her medication or make some kind of adjustment to help her.  Please call today.  Needs help

## 2019-10-11 DIAGNOSIS — F411 Generalized anxiety disorder: Secondary | ICD-10-CM | POA: Diagnosis not present

## 2019-10-15 DIAGNOSIS — F411 Generalized anxiety disorder: Secondary | ICD-10-CM | POA: Diagnosis not present

## 2019-10-15 NOTE — Telephone Encounter (Signed)
Called and spoke with patient about medication changes. Also spoke with therapist and nursing staff was notified.   1 - Stop the Ativan 0.5mg  - 1/2 up to 4 times daily 2 - Taper off the Luvox - 50mg  daily x 7 days, then 25mg  daily x 7 days, then d/c 3 - Plan to add other SSRI - Zoloft 4 - Restart Clonazepam 0.25 up to 4 x daily - may take a 5th dose for severe panic attacks. 5 - Continue Wellbutrin XL 150mg  daily 6 - Continue Zyprexa 5mg  at bedtime

## 2019-10-23 ENCOUNTER — Encounter: Payer: Self-pay | Admitting: Adult Health

## 2019-10-23 ENCOUNTER — Telehealth (INDEPENDENT_AMBULATORY_CARE_PROVIDER_SITE_OTHER): Payer: Medicare Other | Admitting: Adult Health

## 2019-10-23 DIAGNOSIS — F411 Generalized anxiety disorder: Secondary | ICD-10-CM

## 2019-10-23 DIAGNOSIS — G4700 Insomnia, unspecified: Secondary | ICD-10-CM | POA: Diagnosis not present

## 2019-10-23 DIAGNOSIS — F331 Major depressive disorder, recurrent, moderate: Secondary | ICD-10-CM | POA: Diagnosis not present

## 2019-10-23 DIAGNOSIS — F429 Obsessive-compulsive disorder, unspecified: Secondary | ICD-10-CM | POA: Diagnosis not present

## 2019-10-23 MED ORDER — SERTRALINE HCL 50 MG PO TABS: ORAL_TABLET | ORAL | 5 refills | Status: DC

## 2019-10-23 NOTE — Progress Notes (Signed)
Kilynn Fitzsimmons [id number] 28-Mar-1926 86 y.o.  Virtual Visit via Telephone Note  I connected with pt on 10/23/19 at 10:20 AM EDT by telephone and verified that I am speaking with the correct person using two identifiers.   I discussed the limitations, risks, security and privacy concerns of performing an evaluation and management service by telephone and the availability of in person appointments. I also discussed with the patient that there may be a patient responsible charge related to this service. The patient expressed understanding and agreed to proceed.   I discussed the assessment and treatment plan with the patient. The patient was provided an opportunity to ask questions and all were answered. The patient agreed with the plan and demonstrated an understanding of the instructions.   The patient was advised to call back or seek an in-person evaluation if the symptoms worsen or if the condition fails to improve as anticipated.  I provided 30 minutes of non-face-to-face time during this encounter.  The patient was located at home.  The provider was located at [place].   Aloha Gell, NP   Subjective:   Patient ID:  Tina Hampton is a 86 y.o. (DOB 20-May-1925) female.  Chief Complaint: No chief complaint on file.   HPI Tina Hampton presents for follow-up of OCD, anxiety, depression, and insomnia.  Son also on call for interview.   Describes mood today as "not good". Pleasant. Mood symptoms - reports depression, anxiety, and irritability. States "I'm doing terrible". Stating "I feel dizzy headed". Having to sit down. Stating "when am I going to feel better". Also stating "I don't think anything is helping me". Worries that she may not get "better". Feels weak in "general". Working with physical therapist. Decreased interest and motivation to do anything. Does not feel like Clonazepam is helping as much. Talking with therapist regularly - [facility].  Interacting with staff and residents.Taking medications as prescribed. Family visiting. Energy levels decreased. Active, has a regular exercise routine. Working with P/T. Enjoys some usual interests and activities. Widowed. Lives at [facility]. Son visiting with her this morning. Talking with family and friends. Getting out of room. Appetite adequate. Weight stable. Sleeps well most nights. Averages 8 hours. Napping during the day. Focus and concentration difficulties today. Completing tasks. Managing aspects of household. Taking medications independently.  Denies SI or HI. Denies AH or VH.  Multiple medication trials and failures: Latuda, Abilify, Rexulti, Effexor, Prozac, Buspar, Lamictal, Lexapro, Trintellix, Lorazepam, Luvox, Lorazepam  Review of Systems:  Review of Systems  Musculoskeletal: Negative for gait problem.  Neurological: Negative for tremors.  Psychiatric/Behavioral:       Please refer to HPI    Medications: I have reviewed the patient's current medications.  Current Outpatient Medications  Medication Sig Dispense Refill   acetaminophen-codeine (TYLENOL #3) 300-30 MG tablet Take 1 tablet by mouth every 6 (six) hours as needed for moderate pain.     Acetylcysteine 600 MG CAPS Take 1 capsule by mouth daily.     antiseptic oral rinse (BIOTENE) LIQD 15 mLs by Mouth Rinse route every 4 (four) hours as needed for dry mouth. Resident may self administer and keep in room     aspirin 81 MG chewable tablet Chew 81 mg by mouth daily.     Bismuth Subsalicylate (PEPTO-BISMOL PO) Take 30 mg by mouth as needed. For diarrhea     buPROPion (WELLBUTRIN XL) 150 MG 24 hr tablet Take 150 mg by mouth daily.     Calcium Carb-Cholecalciferol (CALTRATE  600+D) 600-800 MG-UNIT TABS Take 1 tablet by mouth 2 (two) times daily.     chlorpheniramine-HYDROcodone (TUSSIONEX PENNKINETIC ER) 10-8 MG/5ML SUER Take 5 mLs by mouth every 12 (twelve) hours as needed for cough.     cholecalciferol  (VITAMIN D) 1000 UNITS tablet Take 1,000 Units by mouth daily.     clonazePAM (KLONOPIN) 0.25 MG disintegrating tablet Take 1 tablet (0.25 mg total) by mouth 4 (four) times daily as needed for seizure. 120 tablet 1   docusate sodium (COLACE) 100 MG capsule Take 1 capsule (100 mg total) by mouth daily. 30 capsule 0   econazole nitrate 1 % cream Apply topically. apply once daily until till resolved, may self administer, may keep at bedside     Eyelid Cleansers (OCUSOFT EYELID CLEANSING) PADS Apply 1 application topically daily.     fluvoxaMINE (LUVOX) 100 MG tablet Take 1 tablet (100 mg total) by mouth at bedtime. 90 tablet 3   fluvoxaMINE (LUVOX) 25 MG tablet Take 1 tablet (25 mg total) by mouth at bedtime. 90 tablet 1   Glucosamine HCl 1000 MG TABS Take 2,000 mg by mouth 2 (two) times daily. 2 tabs     LORazepam (ATIVAN) 0.5 MG tablet Take 1/2 tablet four times daily as needed for anxiety and panic attacks. 60 tablet 2   meloxicam (MOBIC) 7.5 MG tablet TAKE 1 TABLET BY MOUTH EVERY DAY AS NEEDED FOR ARTHRITIS 90 tablet 1   Menthol, Topical Analgesic, (BIOFREEZE) 4 % GEL Apply 1 application topically as needed. Resident may self administer and keep in room     miconazole (MICOTIN) 2 % powder Apply topically as needed for itching. apply to legs and feet once a day     Multiple Vitamin (MULTIVITAMIN WITH MINERALS) TABS Take 1 tablet by mouth daily.     Multiple Vitamins-Minerals (PRESERVISION AREDS 2+MULTI VIT PO) Take 1 tablet by mouth 2 (two) times daily.     NIFEdipine (PROCARDIA XL/ADALAT-CC) 60 MG 24 hr tablet TAKE 1 TABLET BY MOUTH EVERY DAY TO CONTROL BLOOD PRESSURE 90 tablet 3   OLANZapine (ZYPREXA) 5 MG tablet Take 1 tablet (5 mg total) by mouth at bedtime. (Patient taking differently: Take 2.5 mg by mouth at bedtime. ) 30 tablet 2   OLANZapine (ZYPREXA) 5 MG tablet Take 1 tablet (5 mg total) by mouth at bedtime. 30 tablet 2   pantoprazole (PROTONIX) 40 MG tablet One daily to  reduce stomach acid 90 tablet 3   Polyethyl Glycol-Propyl Glycol (SYSTANE) 0.4-0.3 % SOLN Place 1 drop into both eyes. Can also apply  one drop to both eyes QID-PRN,Apply one drop to both eyes QID     polyethylene glycol (MIRALAX / GLYCOLAX) packet Take 17 g by mouth daily.      pravastatin (PRAVACHOL) 10 MG tablet Take 10 mg by mouth daily.     No current facility-administered medications for this visit.    Medication Side Effects: None  Allergies:  Allergies  Allergen Reactions   Lamictal [Lamotrigine] Swelling    Lip swelling   Penicillins Other (See Comments)    CHILDHOOD REACTION   Abilify [Aripiprazole] Rash    Past Medical History:  Diagnosis Date   Abnormal mammogram, unspecified 04/19/2011   Allergy    Anxiety    Backache, unspecified 01/21/2005   Cervicalgia 08/20/2013   Chronic kidney disease, stage II (mild) 04/03/2009   Chronic rhinitis 09/28/2010   DDD (degenerative disc disease)    Depression    Dermatophytosis of nail 01/21/2005  Diverticulosis of colon (without mention of hemorrhage) 06/17/2005   Edema 11/26/2004   GERD (gastroesophageal reflux disease)    Hiatal hernia    High cholesterol    Hyperglycemia 11/18/2014   Hypertension    Insomnia, unspecified 10/18/2011   Left inguinal hernia 04/24/2014   Nocturia 09/19/2008   Osteoporosis, senile 11/18/2014   Other abnormal blood chemistry 04/03/2007   Pain in joint, pelvic region and thigh 11/12/2011   Pain in joint, shoulder region 08/15/2006   Pain in limb 08/26/2005   Palpitations 09/29/2009   PAOD (peripheral arterial occlusive disease) ([place]) 11/18/2014   Right leg; diminished popliteal, dorsalis pedis, and posterior tibial artery pulsations    Postmenopausal atrophic vaginitis 12/14/2004   Scoliosis    Senile osteoporosis 12/14/2004   Tension headache 10/04/2005   Unspecified cataract 11/26/2004   Unspecified constipation 01/21/2005   Unspecified urinary incontinence 11/26/2004    Vaginal stenosis 11/18/2014   Tight band about 1/2 inches into the vagina which will not allow passage of my index finger.     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  Tobacco Use   Smoking status: Never Smoker   Smokeless tobacco: Never Used  Substance and Sexual Activity   Alcohol use: Not Currently    Alcohol/week: 0.0 standard drinks    Comment: one glass of wine in evening   Drug use: No   Sexual activity: Never  Other Topics Concern   Not on file  Social History Narrative   Lives at [facility] since 04/14/2005   Widowed (husband expired 2014)   Has Living Will, POA, MOST   Exercise: water aerobic  5 times a week   Walks with walker   Never smoked   Drinks moderate amount of caffeinate beverages daily   Alcohol none      Social Determinants of Radio broadcast assistant Strain:    Difficulty of Paying Living Expenses:   Food Insecurity:    Worried About Charity fundraiser in the Last Year:    Arboriculturist in the Last Year:   Transportation Needs:    Film/video editor (Medical):    Lack of Transportation (Non-Medical):   Physical Activity:    Days of Exercise per Week:    Minutes of Exercise per Session:   Stress:    Feeling of Stress :   Social Connections:    Frequency of Communication with Friends and Family:    Frequency of Social Gatherings with Friends and Family:    Attends Religious Services:     Active Member of Clubs or Organizations:    Attends Archivist Meetings:    Marital Status:   Intimate Partner Violence:    Fear of Current or Ex-Partner:    Emotionally Abused:  Physically Abused:    Sexually Abused:     Past Medical History, Surgical history, Social history, and Family history were reviewed and updated as appropriate.   Please see review of systems for further details on the patient's review from today.   Objective:   Physical Exam:  There were no vitals taken for this visit.  Physical Exam Constitutional:      General: She is not in acute distress. Musculoskeletal:        General: No deformity.  Neurological:     Mental Status: She is alert and oriented to person, place, and time.     Coordination: Coordination normal.  Psychiatric:        Attention and Perception: Attention and perception normal. She does not perceive auditory or visual hallucinations.        Mood and Affect: Mood is anxious and depressed. Affect is not labile, blunt, angry or inappropriate.        Speech: Speech normal.        Behavior: Behavior normal.        Thought Content: Thought content normal. Thought content is not paranoid or delusional. Thought content does not include homicidal or suicidal ideation. Thought content does not include homicidal or suicidal plan.        Cognition and Memory: Cognition and memory normal.        Judgment: Judgment normal.     Comments: Insight intact     Lab Review:     Component Value Date/Time   NA 140 05/20/2019 0000   NA 143 07/10/2017 0000   K 4.4 05/20/2019 0000   K 4.2 07/10/2017 0000   CL 104 05/20/2019 0000   CL 109 07/10/2017 0000   CO2 28 (A) 05/20/2019 0000   CO2 26 07/10/2017 0000   GLUCOSE 86 01/18/2018 0831   BUN 20 05/20/2019 0000   CREATININE 0.8 05/20/2019 0000   CREATININE 0.93 (H) 01/18/2018 0831   CALCIUM 10.2 05/20/2019 0000   CALCIUM 9.3 07/10/2017 0000   CALCIUM 9.3 07/10/2017 0000   PROT  6.5 01/18/2018 0831   PROT 5.8 07/10/2017 0000   PROT 5.8 07/10/2017 0000   ALBUMIN 3.9 05/20/2019 0000   ALBUMIN 3.5 07/10/2017 0000   ALBUMIN 3.5 07/10/2017 0000   AST 18 05/20/2019 0000   AST 18 07/10/2017 0000   ALT 15 05/20/2019 0000   ALT 13 07/10/2017 0000   ALKPHOS 60 05/20/2019 0000   ALKPHOS 60 07/10/2017 0000   BILITOT 0.3 01/18/2018 0831   BILITOT 0.2 07/10/2017 0000   GFRNONAA 54 (L) 01/18/2018 0831   GFRAA 63 01/18/2018 0831       Component Value Date/Time   WBC 6.5 05/20/2019 0000   WBC 5.1 08/04/2017 0000   RBC 4.22 05/20/2019 0000   HGB 12.5 05/20/2019 0000   HGB 11.6 07/10/2017 0000   HCT 38 05/20/2019 0000   HCT 34 07/10/2017 0000   PLT 212 05/20/2019 0000   MCV 88.4 08/04/2017 0000   MCV 92.3 04/20/2014 1530   MCH 30.2 08/04/2017 0000   MCHC 34.1 08/04/2017 0000   RDW 12.2 08/04/2017 0000   RDW 13.0 08/28/2013 0945   LYMPHSABS 1,363 02/20/2017 0750   LYMPHSABS 1.3 08/28/2013 0945   MONOABS 528 10/17/2016 1512   EOSABS 141 02/20/2017 0750   EOSABS 0.3 08/28/2013 0945   BASOSABS 32 02/20/2017 0750   BASOSABS 0.0 08/28/2013 0945    No results found for: POCLITH, LITHIUM   No results found for: PHENYTOIN,  PHENOBARB, VALPROATE, CBMZ   .res Assessment: Plan:    Plan:  Continue: 1. Wellbutrin XL 150mg  - 1 daily 2. Take Luvox 25mg  daily x 7 days, then leave off 3. Clonazepam 0.25 mg up to 4 times daily  4. Zyprexa 5mg  at hs for mood instability/OCD/anxiety.  5. Add Zoloft 25mg  x 7 days, then increase to 50mg  daily   Continue with therapy - [employer]  Discussed and reviewed plan with son - [place]  RTC 4 weeks  Patient advised to contact office with any questions, adverse effects, or acute worsening in signs and symptoms.  Discussed potential benefits, risk, and side effects of benzodiazepines to include potential risk of tolerance and dependence, as well as possible drowsiness.  Advised patient not to drive if experiencing drowsiness  and to take lowest possible effective dose to minimize risk of dependence and tolerance.  Discussed potential metabolic side effects associated with atypical antipsychotics, as well as potential risk for movement side effects. Advised pt to contact office if movement side effects occur.   There are no diagnoses linked to this encounter.  Please see After Visit Summary for patient specific instructions.  Future Appointments  Date Time Provider [place]  10/23/2019 10:20 AM Rahcel Shutes, Berdie Ogren, NP CP-CP None    No orders of the defined types were placed in this encounter.     -------------------------------

## 2019-10-24 ENCOUNTER — Telehealth: Payer: Self-pay | Admitting: Adult Health

## 2019-10-24 NOTE — Telephone Encounter (Signed)
Noted. Call returned.

## 2019-10-24 NOTE — Telephone Encounter (Signed)
Elmyra Ricks, a nurse at [facility], is requesting to speak with Tina Hampton. About her pt, Tina Hampton.

## 2019-10-24 NOTE — Telephone Encounter (Signed)
Noted thank you

## 2019-10-28 ENCOUNTER — Telehealth: Payer: Self-pay | Admitting: Adult Health

## 2019-10-28 NOTE — Telephone Encounter (Signed)
I can't do a Friday appointment. It will have to be added to my schedule as a meeting.

## 2019-10-28 NOTE — Telephone Encounter (Signed)
Amber called and left a message that she is a Education officer, museum from [facility] and a family member of alisse tuite and the staff want to do a phone conference with you on Friday ,June 25th. Please call amber to set that up at [phone]

## 2019-11-05 ENCOUNTER — Telehealth: Payer: Medicare Other | Admitting: Adult Health

## 2019-11-12 ENCOUNTER — Telehealth: Payer: Self-pay | Admitting: Adult Health

## 2019-11-12 ENCOUNTER — Other Ambulatory Visit: Payer: Self-pay | Admitting: Adult Health

## 2019-11-12 ENCOUNTER — Non-Acute Institutional Stay: Payer: Medicare Other | Admitting: Nurse Practitioner

## 2019-11-12 ENCOUNTER — Encounter: Payer: Self-pay | Admitting: Nurse Practitioner

## 2019-11-12 DIAGNOSIS — F411 Generalized anxiety disorder: Secondary | ICD-10-CM | POA: Diagnosis not present

## 2019-11-12 DIAGNOSIS — K219 Gastro-esophageal reflux disease without esophagitis: Secondary | ICD-10-CM | POA: Diagnosis not present

## 2019-11-12 DIAGNOSIS — F429 Obsessive-compulsive disorder, unspecified: Secondary | ICD-10-CM

## 2019-11-12 DIAGNOSIS — F331 Major depressive disorder, recurrent, moderate: Secondary | ICD-10-CM

## 2019-11-12 DIAGNOSIS — I10 Essential (primary) hypertension: Secondary | ICD-10-CM

## 2019-11-12 DIAGNOSIS — K5900 Constipation, unspecified: Secondary | ICD-10-CM

## 2019-11-12 MED ORDER — SERTRALINE HCL 25 MG PO TABS: ORAL_TABLET | ORAL | 5 refills | Status: DC

## 2019-11-12 NOTE — Assessment & Plan Note (Addendum)
09/19/19 psych: Sertraline 54m qd, continue Zyprexa 2.536mqd(still 7m50md per patient), Klonopin 0.27m36md prn, Wellbutrin 150mg22m  10/31/19 Psych 11/12/19 c/o anxious, fearful, worries, feels strange/fine tremors in fingers for a long time, denied tingling or numbness or pain, declined X-ray cervical or neurology consultation, stated she sleeps well at night, uses pads for incontinent of urine so she doesn't have to get up for bathroom trips at night, c/o poor appetite, but weight is stable. Will update CBC/diff, CMP/eGFR, TSH, continue to f/u Psych. The patient stated the plan is to GDR of Zyprexa, staring Mirtazapine.

## 2019-11-12 NOTE — Progress Notes (Signed)
Location:    [facility] Room Number: 8 Place of Service:  ALF [phone]) Provider: Lennie Odor Deng Kemler NP  Virgie Dad, MD  Patient Care Team: Virgie Dad, MD as PCP - General (Internal Medicine) Melina Modena, [facility] Clent Jacks, MD as Consulting Physician (Ophthalmology) Milus Banister, MD as Consulting Physician (Gastroenterology) Norma Fredrickson, MD as Consulting Physician (Psychiatry) Jessy Oto, MD as Consulting Physician (Orthopedic Surgery) Bjorn Loser, MD as Consulting Physician (Urology)  Extended Emergency Contact Information Primary Emergency Contact: Jamila, [address] [place], [place] [address] Johnnette Litter of [place] Phone: [phone] Mobile Phone: [phone] Relation: Son  Code Status:  Full Code Goals of care: Advanced Directive information Advanced Directives 11/12/2019  Does Patient Have a Medical Advance Directive? Yes  Type of Paramedic of [place];Living will  Does patient want to make changes to medical advance directive? No - Patient declined  Copy of [place] in Chart? Yes - validated most recent copy scanned in chart (See row information)  Pre-existing out of facility DNR order (yellow form or pink MOST form) -     Chief Complaint  Patient presents with   Medical Management of Chronic Issues    HPI:  Pt is a 86 y.o. female seen today for medical management of chronic diseases.    Anxiety, depression, f/u psych, on Wellbutrin 166m qd, Clonopin 0,261mqid prn, Zyprexa 2.86m51md(still 86mg43m per patient), Sertraline 50mg3m GERD, stable, on Pantoprazole 40mg 39m  Constipation, stable, on Colace qd, MiraLax qd.   HTN, blood pressure is controlled, on Nifedipine 60mg q47mSA 81mg qd108mst Medical History:  Diagnosis Date   Abnormal mammogram, unspecified 04/19/2011   Allergy    Anxiety    Backache, unspecified 01/21/2005   Cervicalgia 08/20/2013   Chronic  kidney disease, stage II (mild) 04/03/2009   Chronic rhinitis 09/28/2010   DDD (degenerative disc disease)    Depression    Dermatophytosis of nail 01/21/2005   Diverticulosis of colon (without mention of hemorrhage) 06/17/2005   Edema 11/26/2004   GERD (gastroesophageal reflux disease)    Hiatal hernia    High cholesterol    Hyperglycemia 11/18/2014   Hypertension    Insomnia, unspecified 10/18/2011   Left inguinal hernia 04/24/2014   Nocturia 09/19/2008   Osteoporosis, senile 11/18/2014   Other abnormal blood chemistry 04/03/2007   Pain in joint, pelvic region and thigh 11/12/2011   Pain in joint, shoulder region 08/15/2006   Pain in limb 08/26/2005   Palpitations 09/29/2009   PAOD (peripheral arterial occlusive disease) (HCC) [id number]   Right leg; diminished popliteal, dorsalis pedis, and posterior tibial artery pulsations    Postmenopausal atrophic vaginitis 12/14/2004   Scoliosis    Senile osteoporosis 12/14/2004   Tension headache 10/04/2005   Unspecified cataract 11/26/2004   Unspecified constipation 01/21/2005   Unspecified urinary incontinence 11/26/2004   Vaginal stenosis 11/18/2014   Tight band about 1/2 inches into the vagina which will not allow passage of my index finger.    Past Surgical History:  Procedure Laterality Date   ABDOMINAL HYSTERECTOMY  1969   Dr.Braun    bilateral eyelid surgery  2005   Dr.Scott   BILATERAL OOPHORECTOMY  2002   BREAST SURGERY     benign tumor removal, left breast 1969   CATARACT EXTRACTION     Right   CHOLECYSTECTOMY  1990   Dr.Moore  COLONOSCOPY  3/18/20008   inflammation, diverticular associated colitis -Dr. Ardis Hughs   COSMETIC SURGERY     ESOPHAGOGASTRODUODENOSCOPY N/A 01/24/2014   Procedure: ESOPHAGOGASTRODUODENOSCOPY (EGD);  Surgeon: Irene Shipper, MD;  Location[facility] ENDOSCOPY;  Service: Endoscopy;  Laterality: N/A;   ESOPHAGOGASTRODUODENOSCOPY ENDOSCOPY  04/04/14   Dr. Ardis Hughs   EYE SURGERY     FOREIGN  BODY REMOVAL N/A 01/24/2014   Procedure: FOREIGN BODY REMOVAL;  Surgeon: Irene Shipper, MD;  Location: [facility];  Service: Endoscopy;  Laterality: N/A;   HIP PINNING,CANNULATED  11/07/2011   Procedure: CANNULATED HIP PINNING;  Surgeon: Jessy Oto, MD;  Location: [facility];  Service: Orthopedics;  Laterality: Left;   ORIF FEMORAL NECK FRACTURE W/ DHS  11/07/2011   Dr.Nitka    TONSILLECTOMY     Dr.Joe Tamala Julian   TUBAL LIGATION     VAGINAL PROLAPSE REPAIR  2002   Dr.Horback     Allergies  Allergen Reactions   Lamictal [Lamotrigine] Swelling    Lip swelling   Penicillins Other (See Comments)    CHILDHOOD REACTION   Abilify [Aripiprazole] Rash    Allergies as of 11/12/2019      Reactions   Lamictal [lamotrigine] Swelling   Lip swelling   Penicillins Other (See Comments)   CHILDHOOD REACTION   Abilify [aripiprazole] Rash      Medication List       Accurate as of November 12, 2019 11:59 PM. If you have any questions, ask your nurse or doctor.        STOP taking these medications   LORazepam 0.5 MG tablet Commonly known as: Ativan Stopped by: Linell Meldrum X Vanette Noguchi, NP     TAKE these medications   acetaminophen-codeine 300-30 MG tablet Commonly known as: TYLENOL #3 Take 1 tablet by mouth every 6 (six) hours as needed for moderate pain.   Acetylcysteine 600 MG Caps Take 1 capsule by mouth daily.   antiseptic oral rinse Liqd 15 mLs by Mouth Rinse route every 4 (four) hours as needed for dry mouth. Resident may self administer and keep in room   aspirin 81 MG chewable tablet Chew 81 mg by mouth daily.   Biofreeze 4 % Gel Generic drug: Menthol (Topical Analgesic) Apply 1 application topically as needed. Resident may self administer and keep in room   buPROPion 150 MG 24 hr tablet Commonly known as: WELLBUTRIN XL Take 150 mg by mouth daily.   Caltrate 600+D 600-800 MG-UNIT Tabs Generic drug: Calcium Carb-Cholecalciferol Take 1 tablet by mouth 2 (two) times daily.    cholecalciferol 1000 units tablet Commonly known as: VITAMIN D Take 1,000 Units by mouth daily.   clonazePAM 0.25 MG disintegrating tablet Commonly known as: KLONOPIN Take 1 tablet (0.25 mg total) by mouth 4 (four) times daily as needed for seizure.   docusate sodium 100 MG capsule Commonly known as: Colace Take 1 capsule (100 mg total) by mouth daily.   econazole nitrate 1 % cream Apply topically. apply once daily until till resolved, may self administer, may keep at bedside   Glucosamine HCl 1000 MG Tabs Take 2,000 mg by mouth 2 (two) times daily. 2 tabs   meloxicam 7.5 MG tablet Commonly known as: MOBIC TAKE 1 TABLET BY MOUTH EVERY DAY AS NEEDED FOR ARTHRITIS   miconazole 2 % powder Commonly known as: MICOTIN Apply topically as needed for itching. apply to legs and feet once a day   multivitamin with minerals Tabs tablet Take 1 tablet by mouth daily.  NIFEdipine 60 MG 24 hr tablet Commonly known as: PROCARDIA XL/NIFEDICAL XL TAKE 1 TABLET BY MOUTH EVERY DAY TO CONTROL BLOOD PRESSURE   OcuSoft Eyelid Cleansing Pads Apply 1 application topically daily.   OLANZapine 5 MG tablet Commonly known as: ZyPREXA Take 1 tablet (5 mg total) by mouth at bedtime. What changed: how much to take   OLANZapine 5 MG tablet Commonly known as: ZyPREXA Take 1 tablet (5 mg total) by mouth at bedtime. What changed: Another medication with the same name was changed. Make sure you understand how and when to take each.   pantoprazole 40 MG tablet Commonly known as: PROTONIX One daily to reduce stomach acid   PEPTO-BISMOL PO Take 30 mg by mouth as needed. For diarrhea   polyethylene glycol 17 g packet Commonly known as: MIRALAX / GLYCOLAX Take 17 g by mouth daily.   pravastatin 10 MG tablet Commonly known as: PRAVACHOL Take 10 mg by mouth daily.   PRESERVISION AREDS 2+MULTI VIT PO Take 1 tablet by mouth 2 (two) times daily.   sertraline 50 MG tablet Commonly known as:  ZOLOFT Take 50 mg by mouth daily. What changed: Another medication with the same name was removed. Continue taking this medication, and follow the directions you see here. Changed by: Aloha Gell, NP   Systane 0.4-0.3 % Soln Generic drug: Polyethyl Glycol-Propyl Glycol Place 1 drop into both eyes. Can also apply  one drop to both eyes QID-PRN,Apply one drop to both eyes QID   Tussionex Pennkinetic ER 10-8 MG/5ML Suer Generic drug: chlorpheniramine-HYDROcodone Take 5 mLs by mouth every 12 (twelve) hours as needed for cough.       Review of Systems  Constitutional: Negative for fatigue, fever and unexpected weight change.  HENT: Positive for hearing loss. Negative for congestion and voice change.   Eyes: Negative for visual disturbance.  Respiratory: Negative for cough, shortness of breath and wheezing.   Cardiovascular: Positive for leg swelling. Negative for chest pain and palpitations.  Gastrointestinal: Negative for abdominal pain, constipation, nausea and vomiting.  Genitourinary: Negative for difficulty urinating, dysuria and urgency.       Incontinent of urine  Musculoskeletal: Positive for arthralgias, back pain and gait problem.  Skin: Negative for color change and pallor.  Neurological: Positive for tremors and light-headedness. Negative for speech difficulty and weakness.       C/o feeling strange/fine tremors in fingers, not writing well as prior, feeling lightheaded sometimes, no focal weakness noted.   Psychiatric/Behavioral: Positive for dysphoric mood. Negative for behavioral problems, hallucinations and sleep disturbance. The patient is nervous/anxious.        Worries, fears    Immunization History  Administered Date(s) Administered   Influenza Whole 02/14/2012, 02/14/2013   Influenza, High Dose Seasonal PF 03/14/2017, 02/26/2019   Influenza-Unspecified 02/27/2014, 02/12/2015, 02/25/2016, 03/15/2017, 02/19/2018   Moderna SARS-COVID-2 Vaccination  05/20/2019, 06/17/2019   Pneumococcal Conjugate-13 07/13/2015   Pneumococcal Polysaccharide-23 05/16/1998   Td 05/16/2004   Zoster 05/16/2005   Zoster Recombinat (Shingrix) 02/20/2018, 05/11/2018, 07/16/2018   Pertinent  Health Maintenance Due  Topic Date Due   URINE MICROALBUMIN  Never done   MAMMOGRAM  12/20/2017   INFLUENZA VACCINE  12/15/2019   DEXA SCAN  Completed   PNA vac Low Risk Adult  Completed   Fall Risk  04/09/2019 04/25/2018 04/05/2018 04/21/2017 10/17/2016  Falls in the past year? 0 0 0 No No  Comment Emmi Telephone Survey: data to providers prior to load - Emmi Telephone Survey: data  to providers prior to load - -  Number falls in past yr: - 0 - - -  Injury with Fall? - 0 - - -   Functional Status Survey:    Vitals:   11/12/19 1329  BP: 126/68  Pulse: 85  Resp: (!) 22  Temp: (!) 97.5 F (36.4 C)  SpO2: 92%  Weight: 143 lb 9.6 oz (65.1 kg)  Height: _0  (1.575 m)   Body mass index is 26.26 kg/m. Physical Exam Vitals and nursing note reviewed.  Constitutional:      Appearance: Normal appearance.     Comments: Over weight  HENT:     Head: Normocephalic and atraumatic.     Mouth/Throat:     Mouth: Mucous membranes are moist.  Eyes:     Extraocular Movements: Extraocular movements intact.     Conjunctiva/sclera: Conjunctivae normal.     Pupils: Pupils are equal, round, and reactive to light.  Cardiovascular:     Rate and Rhythm: Normal rate and regular rhythm.     Heart sounds: Murmur heard.   Pulmonary:     Breath sounds: No rales.  Abdominal:     General: Bowel sounds are normal.     Palpations: Abdomen is soft.     Tenderness: There is no abdominal tenderness.  Musculoskeletal:     Cervical back: Normal range of motion and neck supple.     Right lower leg: Edema present.     Left lower leg: Edema present.     Comments: Trace edema BLE  Skin:    General: Skin is warm and dry.  Neurological:     General: No focal deficit present.      Mental Status: She is alert and oriented to person, place, and time. Mental status is at baseline.     Gait: Gait abnormal.  Psychiatric:     Comments: Anxiety, depression     Labs reviewed: Recent Labs    05/20/19 0000  NA 140  K 4.4  CL 104  CO2 28*  BUN 20  CREATININE 0.8  CALCIUM 10.2   Recent Labs    05/20/19 0000  AST 18  ALT 15  ALKPHOS 60  ALBUMIN 3.9   Recent Labs    05/20/19 0000  WBC 6.5  NEUTROABS 3,855  HGB 12.5  HCT 38  PLT 212   Lab Results  Component Value Date   TSH 1.42 09/28/2018   Lab Results  Component Value Date   HGBA1C 5.5 09/28/2018   Lab Results  Component Value Date   CHOL 150 11/08/2018   HDL 47 11/08/2018   LDLCALC 84 11/08/2018   TRIG 99 11/08/2018   CHOLHDL 3.5 08/04/2017    Significant Diagnostic Results in last 30 days:  No results found.  Assessment/Plan GAD (generalized anxiety disorder) 09/19/19 psych: Sertraline 74m qd, continue Zyprexa 2.551mqd(still 64m54md per patient), Klonopin 0.264m28md prn, Wellbutrin 150mg37m  10/31/19 Psych 11/12/19 c/o anxious, fearful, worries, feels strange/fine tremors in fingers for a long time, denied tingling or numbness or pain, declined X-ray cervical or neurology consultation, stated she sleeps well at night, uses pads for incontinent of urine so she doesn't have to get up for bathroom trips at night, c/o poor appetite, but weight is stable. Will update CBC/diff, CMP/eGFR, TSH, continue to f/u Psych. The patient stated the plan is to GDR of Zyprexa, staring Mirtazapine.    Constipation stable, continue Colace qd, MiraLax qd.    Hypertension blood pressure is  controlled, continue Nifedipine 13m qd, ASA 871mqd, update CBC/diff, CMP/eGFR, TSH  GERD (gastroesophageal reflux disease) Stable, continue Pantoprazole 4060md.    Family/ staff Communication: plan of care reviewed with the patient and charge nurse.   Labs/tests ordered: CBC/diff, CMP/eGFR, TSH  Time spend 40  minutes.

## 2019-11-12 NOTE — Assessment & Plan Note (Signed)
Stable, continue Pantoprazole 40mg qd.  

## 2019-11-12 NOTE — Assessment & Plan Note (Signed)
stable, continue Colace qd, MiraLax qd.

## 2019-11-12 NOTE — Assessment & Plan Note (Signed)
blood pressure is controlled, continue Nifedipine 60m qd, ASA 819mqd, update CBC/diff, CMP/eGFR, TSH

## 2019-11-12 NOTE — Telephone Encounter (Signed)
Spoke with Tina Hampton today. Feeling lightheaded and dizzy - hands trembling - restless. Will Decrease Zyprexa to 2.5mg  at hs x 7 and discontinue. Plan to start Remeron - 15mg  at hs - tolerated well. Will also increase Zoloft from 50mg  to 75mg  daily for anxiety and OCD. Plan discussed with Tina Hampton and nurse Elmyra Ricks. Elmyra Ricks will go over changes/instructions with Tina Hampton. Will also notify therapist of changes.

## 2019-11-13 ENCOUNTER — Encounter: Payer: Self-pay | Admitting: Nurse Practitioner

## 2019-11-14 DIAGNOSIS — D649 Anemia, unspecified: Secondary | ICD-10-CM | POA: Diagnosis not present

## 2019-11-14 DIAGNOSIS — F0390 Unspecified dementia without behavioral disturbance: Secondary | ICD-10-CM | POA: Diagnosis not present

## 2019-11-14 DIAGNOSIS — E039 Hypothyroidism, unspecified: Secondary | ICD-10-CM | POA: Diagnosis not present

## 2019-11-14 LAB — HEPATIC FUNCTION PANEL
ALT: 14 (ref 7–35)
AST: 16 (ref 13–35)
Alkaline Phosphatase: 57 (ref 25–125)
Bilirubin, Total: 0.3

## 2019-11-14 LAB — BASIC METABOLIC PANEL
BUN: 16 (ref 4–21)
CO2: 29 — AB (ref 13–22)
Chloride: 103 (ref 99–108)
Creatinine: 0.9 (ref 0.5–1.1)
Glucose: 93
Potassium: 4.1 (ref 3.4–5.3)
Sodium: 139 (ref 137–147)

## 2019-11-14 LAB — TSH: TSH: 2.07 (ref 0.41–5.90)

## 2019-11-14 LAB — CBC AND DIFFERENTIAL
HCT: 36 (ref 36–46)
Hemoglobin: 2.1 — AB (ref 12.0–16.0)
Neutrophils Absolute: 3625
Platelets: 211 (ref 150–399)
WBC: 5.3

## 2019-11-14 LAB — COMPREHENSIVE METABOLIC PANEL
Albumin: 3.9 (ref 3.5–5.0)
Calcium: 10 (ref 8.7–10.7)
GFR calc Af Amer: 69
GFR calc non Af Amer: 59
Globulin: 2.3

## 2019-11-14 LAB — CBC: RBC: 4.03 (ref 3.87–5.11)

## 2019-11-20 ENCOUNTER — Telehealth (INDEPENDENT_AMBULATORY_CARE_PROVIDER_SITE_OTHER): Payer: Medicare Other | Admitting: Adult Health

## 2019-11-20 ENCOUNTER — Encounter: Payer: Self-pay | Admitting: Adult Health

## 2019-11-20 DIAGNOSIS — F332 Major depressive disorder, recurrent severe without psychotic features: Secondary | ICD-10-CM

## 2019-11-20 NOTE — Progress Notes (Signed)
Tina Hampton [id number] Jul 16, 1925 86 y.o.  Subjective:   Patient ID:  Tina Hampton is a 86 y.o. (DOB 04/20/1926) female.  Chief Complaint: No chief complaint on file.   HPI Tina Hampton presents to the office today for follow-up of OCD, anxiety, depression, and insomnia.  Describes mood today as "about the same". Pleasant. Mood symptoms - reports depression, anxiety, and irritability. States "I'm doing terrible". Tapered off of Zyprexa 5mg  over past week - concerns it was making her restless and foggy headed. Has increased dose of Zoloft to 75mg  daily and started Remeron 15mg  last night. Also taking Wellbutrin XL 150mg  every morning and 0.25mg  of Clonazepam up to 4 times daily. Talking with therapist regularly - [facility]. Notes after talking with therapist yesterday, she has a "little bit of hope about getting better". Taking medications as prescribed.  Energy levels decreased. Active, tries to exercise daily.  Enjoys some usual interests and activities. Widowed. Lives [facility]. Talking with family and friends. Getting out of room. Appetite decreased. Weight stable. Sleeps well most nights. Averages 8 hours.Naps during the day some days. Focus and concentration mostly stable. Difficulties with word finding at times. Completing tasks. Keeping room clean. Taking medications independently.  Denies SI or HI. Denies AH or VH.  Multiple medication trials and failures: Latuda, Abilify, Rexulti, Effexor, Prozac, Buspar, Lamictal, Lexapro, Trintellix, Lorazepam, Luvox, Lorazepam   Mini-Mental     Nursing Home from 05/31/2016 in [place] from 11/18/2014 in [facility]  Total Score (max 30 points ) 30 30    PHQ2-9     Nursing Home from 04/25/2018 in [facility] from 04/21/2017 in [facility] from 05/31/2016 in [facility] from 10/20/2015 in [facility] from  09/01/2015 in [place]  PHQ-2 Total Score 6 6 0 1 0  PHQ-9 Total Score 10 15 -- -- --       Review of Systems:  Review of Systems  Eyes: Positive for visual disturbance.    Medications: I have reviewed the patient's current medications.  Current Outpatient Medications  Medication Sig Dispense Refill  . acetaminophen-codeine (TYLENOL #3) 300-30 MG tablet Take 1 tablet by mouth every 6 (six) hours as needed for moderate pain.    . Acetylcysteine 600 MG CAPS Take 1 capsule by mouth daily.    Marland Kitchen antiseptic oral rinse (BIOTENE) LIQD 15 mLs by Mouth Rinse route every 4 (four) hours as needed for dry mouth. Resident may self administer and keep in room    . aspirin 81 MG chewable tablet Chew 81 mg by mouth daily.    . Bismuth Subsalicylate (PEPTO-BISMOL PO) Take 30 mg by mouth as needed. For diarrhea    . buPROPion (WELLBUTRIN XL) 150 MG 24 hr tablet Take 150 mg by mouth daily.    . Calcium Carb-Cholecalciferol (CALTRATE 600+D) 600-800 MG-UNIT TABS Take 1 tablet by mouth 2 (two) times daily.    . cholecalciferol (VITAMIN D) 1000 UNITS tablet Take 1,000 Units by mouth daily.    . clonazePAM (KLONOPIN) 0.25 MG disintegrating tablet Take 1 tablet (0.25 mg total) by mouth 4 (four) times daily as needed for seizure. 120 tablet 1  . docusate sodium (COLACE) 100 MG capsule Take 1 capsule (100 mg total) by mouth daily. 30 capsule 0  . econazole nitrate 1 % cream Apply topically. apply once daily until till resolved, may self administer, may keep at bedside    .  Eyelid Cleansers (OCUSOFT EYELID CLEANSING) PADS Apply 1 application topically daily.    . Glucosamine HCl 1000 MG TABS Take 2,000 mg by mouth 2 (two) times daily. 2 tabs    . meloxicam (MOBIC) 7.5 MG tablet TAKE 1 TABLET BY MOUTH EVERY DAY AS NEEDED FOR ARTHRITIS 90 tablet 1  . Menthol, Topical Analgesic, (BIOFREEZE) 4 % GEL Apply 1 application topically as needed. Resident may self administer and keep in room    . miconazole  (MICOTIN) 2 % powder Apply topically as needed for itching. apply to legs and feet once a day    . mirtazapine (REMERON) 15 MG tablet Take 15 mg by mouth at bedtime.    . Multiple Vitamin (MULTIVITAMIN WITH MINERALS) TABS Take 1 tablet by mouth daily.    . Multiple Vitamins-Minerals (PRESERVISION AREDS 2+MULTI VIT PO) Take 1 tablet by mouth 2 (two) times daily.    Marland Kitchen NIFEdipine (PROCARDIA XL/ADALAT-CC) 60 MG 24 hr tablet TAKE 1 TABLET BY MOUTH EVERY DAY TO CONTROL BLOOD PRESSURE 90 tablet 3  . pantoprazole (PROTONIX) 40 MG tablet One daily to reduce stomach acid 90 tablet 3  . Polyethyl Glycol-Propyl Glycol (SYSTANE) 0.4-0.3 % SOLN Place 1 drop into both eyes. Can also apply  one drop to both eyes QID-PRN,Apply one drop to both eyes QID    . polyethylene glycol (MIRALAX / GLYCOLAX) packet Take 17 g by mouth daily.     . pravastatin (PRAVACHOL) 10 MG tablet Take 10 mg by mouth daily.    . sertraline (ZOLOFT) 25 MG tablet Take by mouth.    . sertraline (ZOLOFT) 50 MG tablet Take 50 mg by mouth daily.     No current facility-administered medications for this visit.    Medication Side Effects: None  Allergies:  Allergies  Allergen Reactions  . Lamictal [Lamotrigine] Swelling    Lip swelling  . Penicillins Other (See Comments)    CHILDHOOD REACTION  . Abilify [Aripiprazole] Rash    Past Medical History:  Diagnosis Date  . Abnormal mammogram, unspecified 04/19/2011  . Allergy   . Anxiety   . Backache, unspecified 01/21/2005  . Cervicalgia 08/20/2013  . Chronic kidney disease, stage II (mild) 04/03/2009  . Chronic rhinitis 09/28/2010  . DDD (degenerative disc disease)   . Depression   . Dermatophytosis of nail 01/21/2005  . Diverticulosis of colon (without mention of hemorrhage) 06/17/2005  . Edema 11/26/2004  . GERD (gastroesophageal reflux disease)   . Hiatal hernia   . High cholesterol   . Hyperglycemia 11/18/2014  . Hypertension   . Insomnia, unspecified 10/18/2011  . Left inguinal hernia  04/24/2014  . Nocturia 09/19/2008  . Osteoporosis, senile 11/18/2014  . Other abnormal blood chemistry 04/03/2007  . Pain in joint, pelvic region and thigh 11/12/2011  . Pain in joint, shoulder region 08/15/2006  . Pain in limb 08/26/2005  . Palpitations 09/29/2009  . PAOD (peripheral arterial occlusive disease) ([place]) 11/18/2014   Right leg; diminished popliteal, dorsalis pedis, and posterior tibial artery pulsations   . Postmenopausal atrophic vaginitis 12/14/2004  . Scoliosis   . Senile osteoporosis 12/14/2004  . Tension headache 10/04/2005  . Unspecified cataract 11/26/2004  . Unspecified constipation 01/21/2005  . Unspecified urinary incontinence 11/26/2004  . Vaginal stenosis 11/18/2014   Tight band about 1/2 inches into the vagina which will not allow passage of my index finger.     Family History  Problem Relation Age of Onset  . Heart disease Mother   . Stroke Mother   .  Emphysema Father   . Arthritis Father   . Alcohol abuse Brother   . Heart disease Son   . Leukemia Son   . Esophageal cancer Neg Hx   . Stomach cancer Neg Hx   . Colon cancer Neg Hx     Social History   Socioeconomic History  . Marital status: Widowed    Spouse name: Not on file  . Number of children: Not on file  . Years of education: Not on file  . Highest education level: Not on file  Occupational History  . 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 Smoking status: Never Smoker  . Smokeless tobacco: Never Used  Vaping Use  . Vaping Use: Never used  Substance and Sexual Activity  . Alcohol use: Not Currently    Alcohol/week: 0.0 standard drinks    Comment: one glass of wine in evening  . Drug use: No  . Sexual activity: Never  Other Topics Concern  . Not on file  Social History Narrative   L[facility] at [facility] since 04/14/2005   Widowed (husband expired 2014)   Has Living Will, POA, MOST   Exercise: water aerobic  5 times a week   Walks with walker   Never smoked    Drinks moderate amount of caffeinate beverages daily   Alcohol none      Social Determinants of Health   Financial Resource Strain:   . Difficulty of Paying Living Expenses:   Food Insecurity:   . Worried About Charity fundraiser in the Last Year:   . Arboriculturist in the Last Year:   Transportation Needs:   . Film/video editor (Medical):   Marland Kitchen Lack of Transportation (Non-Medical):   Physical Activity:   . Days of Exercise per Week:   . Minutes of Exercise per Session:   Stress:   . Feeling of Stress :   Social Connections:   . Frequency of Communication with Friends and Family:   . Frequency of Social Gatherings with Friends and Family:   . Attends Religious Services:   . Active Member of Clubs or Organizations:   . Attends Archivist Meetings:   Marland Kitchen Marital Status:   Intimate Partner Violence:   . Fear of Current or Ex-Partner:   . Emotionally Abused:   Marland Kitchen Physically Abused:   . Sexually Abused:     Past Medical History, Surgical history, Social history, and Family history were reviewed and updated as appropriate.   Please see review of systems for further details on the patient's review from today.   Objective:   Physical Exam:  There were no vitals taken for this visit.  Physical Exam Neurological:     Mental Status: She is alert and oriented to person, place, and time.     Cranial Nerves: No dysarthria.  Psychiatric:        Attention and Perception: Attention and perception normal.        Mood and Affect: Mood is anxious and depressed.        Speech: Speech normal.        Behavior: Behavior is cooperative.        Thought Content: Thought content normal. Thought content is not paranoid or delusional. Thought content does not include homicidal or suicidal ideation. Thought content does not include homicidal or suicidal plan.        Cognition and Memory: Cognition and memory normal.  Judgment: Judgment normal.     Comments: Insight intact      Lab Review:     Component Value Date/Time   NA 140 05/20/2019 0000   NA 143 07/10/2017 0000   K 4.4 05/20/2019 0000   K 4.2 07/10/2017 0000   CL 104 05/20/2019 0000   CL 109 07/10/2017 0000   CO2 28 (A) 05/20/2019 0000   CO2 26 07/10/2017 0000   GLUCOSE 86 01/18/2018 0831   BUN 20 05/20/2019 0000   CREATININE 0.8 05/20/2019 0000   CREATININE 0.93 (H) 01/18/2018 0831   CALCIUM 10.2 05/20/2019 0000   CALCIUM 9.3 07/10/2017 0000   CALCIUM 9.3 07/10/2017 0000   PROT 6.5 01/18/2018 0831   PROT 5.8 07/10/2017 0000   PROT 5.8 07/10/2017 0000   ALBUMIN 3.9 05/20/2019 0000   ALBUMIN 3.5 07/10/2017 0000   ALBUMIN 3.5 07/10/2017 0000   AST 18 05/20/2019 0000   AST 18 07/10/2017 0000   ALT 15 05/20/2019 0000   ALT 13 07/10/2017 0000   ALKPHOS 60 05/20/2019 0000   ALKPHOS 60 07/10/2017 0000   BILITOT 0.3 01/18/2018 0831   BILITOT 0.2 07/10/2017 0000   GFRNONAA 54 (L) 01/18/2018 0831   GFRAA 63 01/18/2018 0831       Component Value Date/Time   WBC 6.5 05/20/2019 0000   WBC 5.1 08/04/2017 0000   RBC 4.22 05/20/2019 0000   HGB 12.5 05/20/2019 0000   HGB 11.6 07/10/2017 0000   HCT 38 05/20/2019 0000   HCT 34 07/10/2017 0000   PLT 212 05/20/2019 0000   MCV 88.4 08/04/2017 0000   MCV 92.3 04/20/2014 1530   MCH 30.2 08/04/2017 0000   MCHC 34.1 08/04/2017 0000   RDW 12.2 08/04/2017 0000   RDW 13.0 08/28/2013 0945   LYMPHSABS 1,363 02/20/2017 0750   LYMPHSABS 1.3 08/28/2013 0945   MONOABS 528 10/17/2016 1512   EOSABS 141 02/20/2017 0750   EOSABS 0.3 08/28/2013 0945   BASOSABS 32 02/20/2017 0750   BASOSABS 0.0 08/28/2013 0945    No results found for: POCLITH, LITHIUM   No results found for: PHENYTOIN, PHENOBARB, VALPROATE, CBMZ   .res Assessment: Plan:    Plan:  Continue:  1. Wellbutrin XL150mg  daily 2. Zoloft 75mg  daily  3. Clonazepam 0.25 mg up to 4 times daily as needed for anxiety 4. Remeron 15mg  at bedtime  Continue therapy - [facility]  Greater than 50% of time with patient was spent on counseling and coordination of care. Conference call today with patient, her son Wray Kearns, [place], [place], and [facility], her therapist. Discussed current and past medication regimen and future goals. Discussed therapeutic opportunities to help with mood symptoms. Will titrate medications as needed. Has taken Remeron once, but it has worked well in the past for sleep, anxiety, and appetite. Patient stating "I felt a teeny tiny bit of hope yesterday". Will plan to have patient seen by optometry for blurred vision.   RTC 4 weeks  Patient advised to contact office with any questions, adverse effects, or acute worsening in signs and symptoms.  Discussed potential benefits, risk, and side effects of benzodiazepines to include potential risk of tolerance and dependence, as well as possible drowsiness.  Advised patient not to drive if experiencing drowsiness and to take lowest possible effective dose to minimize risk of dependence and tolerance    There are no diagnoses linked to this encounter.   Please see After Visit Summary for patient specific instructions.  No future  appointments.  No orders of the defined types were placed in this encounter.   -------------------------------

## 2019-11-28 ENCOUNTER — Telehealth: Payer: Self-pay | Admitting: Adult Health

## 2019-11-28 ENCOUNTER — Other Ambulatory Visit: Payer: Self-pay | Admitting: Adult Health

## 2019-11-28 DIAGNOSIS — F411 Generalized anxiety disorder: Secondary | ICD-10-CM | POA: Diagnosis not present

## 2019-11-28 MED ORDER — SERTRALINE HCL 100 MG PO TABS: 100.0000 mg | ORAL_TABLET | Freq: Every day | ORAL | 5 refills | Status: DC

## 2019-11-28 MED ORDER — BUPROPION HCL ER (XL) 300 MG PO TB24: 300.0000 mg | ORAL_TABLET | Freq: Every day | ORAL | 5 refills | Status: DC

## 2019-11-28 NOTE — Telephone Encounter (Signed)
Called and spoke with patient regarding medication changes.  Increase Zoloft 75mg  to 100mg  daily  Increase Wellbutrin XL 150mg  to 300mg  every morning.  Orders sent over to [facility] Showfety/Nurse.

## 2019-12-06 DIAGNOSIS — F411 Generalized anxiety disorder: Secondary | ICD-10-CM | POA: Diagnosis not present

## 2019-12-13 DIAGNOSIS — F411 Generalized anxiety disorder: Secondary | ICD-10-CM | POA: Diagnosis not present

## 2019-12-16 DIAGNOSIS — H40053 Ocular hypertension, bilateral: Secondary | ICD-10-CM | POA: Diagnosis not present

## 2019-12-16 DIAGNOSIS — H353132 Nonexudative age-related macular degeneration, bilateral, intermediate dry stage: Secondary | ICD-10-CM | POA: Diagnosis not present

## 2019-12-16 DIAGNOSIS — Z961 Presence of intraocular lens: Secondary | ICD-10-CM | POA: Diagnosis not present

## 2019-12-16 DIAGNOSIS — H0102B Squamous blepharitis left eye, upper and lower eyelids: Secondary | ICD-10-CM | POA: Diagnosis not present

## 2019-12-16 DIAGNOSIS — H04123 Dry eye syndrome of bilateral lacrimal glands: Secondary | ICD-10-CM | POA: Diagnosis not present

## 2019-12-16 DIAGNOSIS — H0102A Squamous blepharitis right eye, upper and lower eyelids: Secondary | ICD-10-CM | POA: Diagnosis not present

## 2019-12-23 DIAGNOSIS — F419 Anxiety disorder, unspecified: Secondary | ICD-10-CM | POA: Diagnosis not present

## 2019-12-23 DIAGNOSIS — E785 Hyperlipidemia, unspecified: Secondary | ICD-10-CM | POA: Diagnosis not present

## 2019-12-23 DIAGNOSIS — R739 Hyperglycemia, unspecified: Secondary | ICD-10-CM | POA: Diagnosis not present

## 2019-12-23 DIAGNOSIS — Z79899 Other long term (current) drug therapy: Secondary | ICD-10-CM | POA: Diagnosis not present

## 2019-12-23 DIAGNOSIS — I4891 Unspecified atrial fibrillation: Secondary | ICD-10-CM | POA: Diagnosis not present

## 2019-12-23 DIAGNOSIS — I10 Essential (primary) hypertension: Secondary | ICD-10-CM | POA: Diagnosis not present

## 2019-12-23 DIAGNOSIS — N1832 Chronic kidney disease, stage 3b: Secondary | ICD-10-CM | POA: Diagnosis not present

## 2019-12-23 LAB — LIPID PANEL
Cholesterol: 174 (ref 0–200)
HDL: 58 (ref 35–70)
LDL Cholesterol: 91

## 2019-12-26 DIAGNOSIS — F411 Generalized anxiety disorder: Secondary | ICD-10-CM | POA: Diagnosis not present

## 2020-01-02 DIAGNOSIS — F411 Generalized anxiety disorder: Secondary | ICD-10-CM | POA: Diagnosis not present

## 2020-01-03 ENCOUNTER — Non-Acute Institutional Stay: Payer: Medicare Other | Admitting: Nurse Practitioner

## 2020-01-03 ENCOUNTER — Other Ambulatory Visit: Payer: Self-pay | Admitting: *Deleted

## 2020-01-03 ENCOUNTER — Encounter: Payer: Self-pay | Admitting: Nurse Practitioner

## 2020-01-03 DIAGNOSIS — F411 Generalized anxiety disorder: Secondary | ICD-10-CM

## 2020-01-03 DIAGNOSIS — F339 Major depressive disorder, recurrent, unspecified: Secondary | ICD-10-CM | POA: Diagnosis not present

## 2020-01-03 DIAGNOSIS — I10 Essential (primary) hypertension: Secondary | ICD-10-CM

## 2020-01-03 DIAGNOSIS — K5900 Constipation, unspecified: Secondary | ICD-10-CM

## 2020-01-03 DIAGNOSIS — G5692 Unspecified mononeuropathy of left upper limb: Secondary | ICD-10-CM | POA: Diagnosis not present

## 2020-01-03 DIAGNOSIS — R251 Tremor, unspecified: Secondary | ICD-10-CM | POA: Diagnosis not present

## 2020-01-03 DIAGNOSIS — K449 Diaphragmatic hernia without obstruction or gangrene: Secondary | ICD-10-CM

## 2020-01-03 MED ORDER — CLONAZEPAM 0.25 MG PO TBDP: 0.2500 mg | ORAL_TABLET | Freq: Four times a day (QID) | ORAL | 1 refills | Status: DC | PRN

## 2020-01-03 NOTE — Progress Notes (Signed)
Location:   [facility] Room Number: 8 Place of Service:  ALF [phone]) Provider:  Marlana Latus, NP  Virgie Dad, MD  Patient Care Team: Virgie Dad, MD as PCP - General (Internal Medicine) Melina Modena, [facility] Clent Jacks, MD as Consulting Physician (Ophthalmology) Milus Banister, MD as Consulting Physician (Gastroenterology) Norma Fredrickson, MD as Consulting Physician (Psychiatry) Jessy Oto, MD as Consulting Physician (Orthopedic Surgery) Bjorn Loser, MD as Consulting Physician (Urology)  Extended Emergency Contact Information Primary Emergency Contact: Mehek, Grega [place], [place] [address] Johnnette Litter of [place] Phone: [phone] Mobile Phone: [phone] Relation: Son  Code Status:  Full Code Goals of care: Advanced Directive information Advanced Directives 01/03/2020  Does Patient Have a Medical Advance Directive? Yes  Type of Paramedic of [place];Living will;Out of facility DNR (pink MOST or yellow form)  Does patient want to make changes to medical advance directive? No - Patient declined  Copy of [place] in Chart? Yes - validated most recent copy scanned in chart (See row information)  Pre-existing out of facility DNR order (yellow form or pink MOST form) Pink MOST form placed in chart (order not valid for inpatient use)     Chief Complaint  Patient presents with   Medical Management of Chronic Issues    Routine follow up visit.   Quality Metric Gaps    Urine microalbumin, Mammogram   Best Practice Recommendations    Tetanus/Tdap. Flu vaccine    HPI:  Pt is a 86 y.o. female seen today for medical management of chronic diseases.     Anxiety, depression, f/u psych, on Wellbutrin, Clonopin, Sertraline, Mirtazapine             GERD, stable, on Pantoprazole 40mg  qd.              Constipation, stable, on Colace qd, MiraLax qd.              HTN, blood pressure is  controlled, on Nifedipine 60mg  qd, ASA 81mg  qd  Past Medical History:  Diagnosis Date   Abnormal mammogram, unspecified 04/19/2011   Allergy    Anxiety    Backache, unspecified 01/21/2005   Cervicalgia 08/20/2013   Chronic kidney disease, stage II (mild) 04/03/2009   Chronic rhinitis 09/28/2010   DDD (degenerative disc disease)    Depression    Dermatophytosis of nail 01/21/2005   Diverticulosis of colon (without mention of hemorrhage) 06/17/2005   Edema 11/26/2004   GERD (gastroesophageal reflux disease)    Hiatal hernia    High cholesterol    Hyperglycemia 11/18/2014   Hypertension    Insomnia, unspecified 10/18/2011   Left inguinal hernia 04/24/2014   Nocturia 09/19/2008   Osteoporosis, senile 11/18/2014   Other abnormal blood chemistry 04/03/2007   Pain in joint, pelvic region and thigh 11/12/2011   Pain in joint, shoulder region 08/15/2006   Pain in limb 08/26/2005   Palpitations 09/29/2009   PAOD (peripheral arterial occlusive disease) ([place]) 11/18/2014   Right leg; diminished popliteal, dorsalis pedis, and posterior tibial artery pulsations    Postmenopausal atrophic vaginitis 12/14/2004   Scoliosis    Senile osteoporosis 12/14/2004   Tension headache 10/04/2005   Unspecified cataract 11/26/2004   Unspecified constipation 01/21/2005   Unspecified urinary incontinence 11/26/2004   Vaginal stenosis 11/18/2014   Tight band about 1/2 inches into the vagina which will not allow passage of my index finger.  Past Surgical History:  Procedure Laterality Date   ABDOMINAL HYSTERECTOMY  1969   Dr.Braun    bilateral eyelid surgery  2005   Dr.Scott   BILATERAL OOPHORECTOMY  2002   BREAST SURGERY     benign tumor removal, left breast 1969   CATARACT EXTRACTION     Right   CHOLECYSTECTOMY  1990   Dr.Moore    COLONOSCOPY  3/18/20008   inflammation, diverticular associated colitis -Dr. Ardis Hughs   COSMETIC SURGERY     ESOPHAGOGASTRODUODENOSCOPY N/A 01/24/2014    Procedure: ESOPHAGOGASTRODUODENOSCOPY (EGD);  Surgeon: Irene Shipper, MD;  Location: [facility] ENDOSCOPY;  Service: Endoscopy;  Laterality: N/A;   ESOPHAGOGASTRODUODENOSCOPY ENDOSCOPY  04/04/14   Dr. Ardis Hughs   EYE SURGERY     FOREIGN BODY REMOVAL N/A 01/24/2014   Procedure: FOREIGN BODY REMOVAL;  Surgeon: Irene Shipper, MD;  Location: [place];  Service: Endoscopy;  Laterality: N/A;   HIP PINNING,CANNULATED  11/07/2011   Procedure: CANNULATED HIP PINNING;  Surgeon: Jessy Oto, MD;  Location: [facility];  Service: Orthopedics;  Laterality: Left;   ORIF FEMORAL NECK FRACTURE W/ DHS  11/07/2011   Dr.Nitka    TONSILLECTOMY     Dr.Joe Tamala Julian   TUBAL LIGATION     VAGINAL PROLAPSE REPAIR  2002   Dr.Horback     Allergies  Allergen Reactions   Lamictal [Lamotrigine] Swelling    Lip swelling   Penicillins Other (See Comments)    CHILDHOOD REACTION   Abilify [Aripiprazole] Rash    Allergies as of 01/03/2020      Reactions   Lamictal [lamotrigine] Swelling   Lip swelling   Penicillins Other (See Comments)   CHILDHOOD REACTION   Abilify [aripiprazole] Rash      Medication List       Accurate as of January 03, 2020 11:59 PM. If you have any questions, ask your nurse or doctor.        acetaminophen-codeine 300-30 MG tablet Commonly known as: TYLENOL #3 Take 1 tablet by mouth every 6 (six) hours as needed for moderate pain.   Acetylcysteine 600 MG Caps Take 1 capsule by mouth daily.   antiseptic oral rinse Liqd 15 mLs by Mouth Rinse route every 4 (four) hours as needed for dry mouth. Resident may self administer and keep in room   aspirin 81 MG chewable tablet Chew 81 mg by mouth daily.   Biofreeze 4 % Gel Generic drug: Menthol (Topical Analgesic) Apply 1 application topically as needed. Resident may self administer and keep in room   buPROPion 300 MG 24 hr tablet Commonly known as: WELLBUTRIN XL Take 1 tablet (300 mg total) by mouth daily.   Caltrate 600+D 600-800  MG-UNIT Tabs Generic drug: Calcium Carb-Cholecalciferol Take 1 tablet by mouth 2 (two) times daily.   cholecalciferol 1000 units tablet Commonly known as: VITAMIN D Take 1,000 Units by mouth daily.   clonazePAM 0.25 MG disintegrating tablet Commonly known as: KLONOPIN Take 1 tablet (0.25 mg total) by mouth 4 (four) times daily as needed for seizure.   docusate sodium 100 MG capsule Commonly known as: Colace Take 1 capsule (100 mg total) by mouth daily.   econazole nitrate 1 % cream Apply topically. apply once daily until till resolved, may self administer, may keep at bedside   Glucosamine HCl 1000 MG Tabs Take 2,000 mg by mouth 2 (two) times daily. 2 tabs   hypromellose 0.3 % Gel ophthalmic ointment Commonly known as: GENTEAL Place into both eyes 4 (four) times  daily.   meloxicam 7.5 MG tablet Commonly known as: MOBIC TAKE 1 TABLET BY MOUTH EVERY DAY AS NEEDED FOR ARTHRITIS   miconazole 2 % powder Commonly known as: MICOTIN Apply topically as needed for itching. apply to legs and feet once a day   mirtazapine 15 MG tablet Commonly known as: REMERON Take 15 mg by mouth at bedtime.   multivitamin with minerals Tabs tablet Take 1 tablet by mouth daily.   NIFEdipine 60 MG 24 hr tablet Commonly known as: PROCARDIA XL/NIFEDICAL XL TAKE 1 TABLET BY MOUTH EVERY DAY TO CONTROL BLOOD PRESSURE   OcuSoft Eyelid Cleansing Pads Apply 1 application topically daily.   pantoprazole 40 MG tablet Commonly known as: PROTONIX One daily to reduce stomach acid   PEPTO-BISMOL PO Take 30 mg by mouth as needed. For diarrhea   polyethylene glycol 17 g packet Commonly known as: MIRALAX / GLYCOLAX Take 17 g by mouth daily.   pravastatin 10 MG tablet Commonly known as: PRAVACHOL Take 10 mg by mouth daily.   PRESERVISION AREDS 2+MULTI VIT PO Take 1 tablet by mouth 2 (two) times daily.   Systane 0.4-0.3 % Soln Generic drug: Polyethyl Glycol-Propyl Glycol Place 1 drop into both  eyes. Can also apply  one drop to both eyes QID-PRN,Apply one drop to both eyes QID   Tussionex Pennkinetic ER 10-8 MG/5ML Suer Generic drug: chlorpheniramine-HYDROcodone Take 5 mLs by mouth. Give 5 ml q12H PRN cough, Resident may self administer and keep in room   ZOLOFT PO Take by mouth. 150 mg, oral, Zoloft 150 mg QD; may self admin; may keep at bedside What changed: Another medication with the same name was removed. Continue taking this medication, and follow the directions you see here. Changed by: Laquida Cotrell X Mariusz Jubb, NP       Review of Systems  Constitutional: Negative for fatigue, fever and unexpected weight change.  HENT: Positive for hearing loss. Negative for congestion and voice change.   Eyes: Negative for visual disturbance.  Respiratory: Negative for cough and shortness of breath.   Cardiovascular: Positive for leg swelling. Negative for chest pain and palpitations.  Gastrointestinal: Negative for abdominal pain, constipation, nausea and vomiting.  Genitourinary: Negative for difficulty urinating, dysuria and urgency.       Incontinent of urine  Musculoskeletal: Positive for arthralgias, back pain and gait problem.  Skin: Negative for color change and pallor.  Neurological: Positive for tremors and numbness. Negative for speech difficulty, weakness, light-headedness and headaches.       Right finger resting tremor mild, left fingers feels numb  Psychiatric/Behavioral: Positive for dysphoric mood. Negative for behavioral problems, hallucinations and sleep disturbance. The patient is nervous/anxious.        Worries, fears    Immunization History  Administered Date(s) Administered   Influenza Whole 02/14/2012, 02/14/2013   Influenza, High Dose Seasonal PF 03/14/2017, 02/26/2019   Influenza-Unspecified 02/27/2014, 02/12/2015, 02/25/2016, 03/15/2017, 02/19/2018   Moderna SARS-COVID-2 Vaccination 05/20/2019, 06/17/2019   Pneumococcal Conjugate-13 07/13/2015   Pneumococcal  Polysaccharide-23 05/16/1998   Td 05/16/2004   Zoster 05/16/2005   Zoster Recombinat (Shingrix) 02/20/2018, 05/11/2018, 07/16/2018   Pertinent  Health Maintenance Due  Topic Date Due   URINE MICROALBUMIN  Never done   MAMMOGRAM  12/20/2017   INFLUENZA VACCINE  12/15/2019   DEXA SCAN  Completed   PNA vac Low Risk Adult  Completed   Fall Risk  04/09/2019 04/25/2018 04/05/2018 04/21/2017 10/17/2016  Falls in the past year? 0 0 0 No No  Comment  Emmi Telephone Survey: data to providers prior to load - Emmi Telephone Survey: data to providers prior to load - -  Number falls in past yr: - 0 - - -  Injury with Fall? - 0 - - -   Functional Status Survey:    There were no vitals filed for this visit. There is no height or weight on file to calculate BMI. Physical Exam Vitals and nursing note reviewed.  Constitutional:      Appearance: Normal appearance.     Comments: Over weight  HENT:     Head: Normocephalic and atraumatic.     Mouth/Throat:     Mouth: Mucous membranes are moist.  Eyes:     Extraocular Movements: Extraocular movements intact.     Conjunctiva/sclera: Conjunctivae normal.     Pupils: Pupils are equal, round, and reactive to light.  Cardiovascular:     Rate and Rhythm: Normal rate and regular rhythm.     Heart sounds: Murmur heard.   Pulmonary:     Breath sounds: No rales.  Abdominal:     General: Bowel sounds are normal.     Palpations: Abdomen is soft.     Tenderness: There is no abdominal tenderness.  Musculoskeletal:     Cervical back: Normal range of motion and neck supple.     Right lower leg: Edema present.     Left lower leg: Edema present.     Comments: Trace edema BLE  Skin:    General: Skin is warm and dry.  Neurological:     General: No focal deficit present.     Mental Status: She is alert and oriented to person, place, and time. Mental status is at baseline.     Gait: Gait abnormal.  Psychiatric:     Comments: Anxiety, depression,  feel not loved.      Labs reviewed: Recent Labs    05/20/19 0000 11/14/19 0000  NA 140 139  K 4.4 4.1  CL 104 103  CO2 28* 29*  BUN 20 16  CREATININE 0.8 0.9  CALCIUM 10.2 10.0   Recent Labs    05/20/19 0000 11/14/19 0000  AST 18 16  ALT 15 14  ALKPHOS 60 57  ALBUMIN 3.9 3.9   Recent Labs    05/20/19 0000 11/14/19 0000  WBC 6.5 5.3  NEUTROABS 3,855 3,625  HGB 12.5 2.1*  HCT 38 36  PLT 212 211   Lab Results  Component Value Date   TSH 2.07 11/14/2019   Lab Results  Component Value Date   HGBA1C 5.5 09/28/2018   Lab Results  Component Value Date   CHOL 174 12/23/2019   HDL 58 12/23/2019   LDLCALC 91 12/23/2019   TRIG 99 11/08/2018   CHOLHDL 3.5 08/04/2017    Significant Diagnostic Results in last 30 days:  No results found.  Assessment/Plan There are no diagnoses linked to this encounter.   Family/ staff Communication:   Labs/tests ordered:

## 2020-01-03 NOTE — Assessment & Plan Note (Signed)
Mild resting tremor in the right fingers, now new.

## 2020-01-03 NOTE — Assessment & Plan Note (Signed)
Stable, continue Pantoprazole.  

## 2020-01-03 NOTE — Assessment & Plan Note (Signed)
Blood pressure is controlled, continue Nifedipine, ASA

## 2020-01-03 NOTE — Assessment & Plan Note (Addendum)
Anxiety, depression, feel not loved, miserable, but sleeps, eats, maintain weight, f/u psych, on Wellbutrin 300mg  qd,Clonopin 0,25mg  qid prn, Mirtazapine 15mg  qd Sertraline 150mg  qd. Last seen Psych 12/30/19

## 2020-01-03 NOTE — Telephone Encounter (Signed)
Received refill Request From [facility] and sent to [facility] for approval.

## 2020-01-03 NOTE — Assessment & Plan Note (Signed)
Stable, continue MiraLax, Colace.  

## 2020-01-03 NOTE — Assessment & Plan Note (Signed)
Gradual onset, long standing, started with the left 4th 5th fingers, some kind of numbness, not at night, progressed to all 5 fingers, denied pain, weakness, didn't think is severe enough to see a Neurology or be treated. Observe.

## 2020-01-06 ENCOUNTER — Encounter: Payer: Self-pay | Admitting: Nurse Practitioner

## 2020-01-13 ENCOUNTER — Other Ambulatory Visit: Payer: Self-pay | Admitting: Nurse Practitioner

## 2020-01-13 DIAGNOSIS — F411 Generalized anxiety disorder: Secondary | ICD-10-CM

## 2020-01-13 MED ORDER — ALPRAZOLAM 0.25 MG PO TBDP: 0.2500 mg | ORAL_TABLET | Freq: Four times a day (QID) | ORAL | 1 refills | Status: DC | PRN

## 2020-01-14 ENCOUNTER — Telehealth: Payer: Self-pay | Admitting: Adult Health

## 2020-01-14 NOTE — Telephone Encounter (Signed)
Called and spoke with patient. Patient started Xanax 0.25mg  this morning at 745 and then took a 2nd Xanax at 10:15. Felt "woozy headed" and is laying down. Advised to stop Xanax. Nurse available to discuss. Will call back with updated plan.

## 2020-01-14 NOTE — Telephone Encounter (Signed)
Noted thank you

## 2020-01-14 NOTE — Telephone Encounter (Signed)
Can you call and advise if she wants to go back to the Clonazepam.

## 2020-01-14 NOTE — Telephone Encounter (Signed)
Called and discussed

## 2020-01-14 NOTE — Telephone Encounter (Signed)
Pt called to report she had taken Xanax this morning and she is very dizzy. Can't even get dressed. She stated Klonopin did not make her feel this dizzy. Please contact @ [phone]. ASAP

## 2020-01-15 ENCOUNTER — Telehealth: Payer: Self-pay | Admitting: Adult Health

## 2020-01-15 DIAGNOSIS — F411 Generalized anxiety disorder: Secondary | ICD-10-CM | POA: Diagnosis not present

## 2020-01-15 NOTE — Telephone Encounter (Signed)
Called and spoke with patient. Has restarted Clonazepam and D/C Xanax.

## 2020-01-15 NOTE — Telephone Encounter (Signed)
Nurse @ [facility] asking for return call about resent med change.  Call nurse desk with order change @ [phone]. Pt cell # [phone]

## 2020-01-23 ENCOUNTER — Telehealth: Payer: Medicare Other | Admitting: Adult Health

## 2020-01-23 ENCOUNTER — Telehealth (INDEPENDENT_AMBULATORY_CARE_PROVIDER_SITE_OTHER): Payer: Medicare Other | Admitting: Adult Health

## 2020-01-23 ENCOUNTER — Encounter: Payer: Self-pay | Admitting: Adult Health

## 2020-01-23 DIAGNOSIS — F331 Major depressive disorder, recurrent, moderate: Secondary | ICD-10-CM | POA: Diagnosis not present

## 2020-01-23 DIAGNOSIS — G4700 Insomnia, unspecified: Secondary | ICD-10-CM

## 2020-01-23 DIAGNOSIS — F429 Obsessive-compulsive disorder, unspecified: Secondary | ICD-10-CM | POA: Diagnosis not present

## 2020-01-23 DIAGNOSIS — F411 Generalized anxiety disorder: Secondary | ICD-10-CM | POA: Diagnosis not present

## 2020-01-23 NOTE — Progress Notes (Signed)
Tina Hampton [id number] 1925/06/08 86 y.o.  Subjective:   Patient ID:  Tina Hampton is a 86 y.o. (DOB 1925-06-01) female.  Chief Complaint: No chief complaint on file.   HPI [name] presents to the office today for follow-up of OCD, anxiety, depression, and insomnia.  Describes mood today as "not good". Pleasant. Mood symptoms - reports depression, anxiety, and irritability. States "I'm not doing well". Feels like Clonazepam is making her feel "woozy headed" and would like to taper off it. Has left off bedtime dose for the past 7 days. Stating "I think it's making me worse". Taking other medications and feel they are helpful. Stating "I'm resting well". 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 Taking medications as prescribed.  Energy levels decreased - "not able to do the things I used to do". Active, exercise daily.  Enjoys some usual interests and activities. Widowed. Lives at [facility]. Talking with family and friends. Getting out of room daily. Appetite decreased. Weight stable. Sleeps well most nights. Averages 8 hours. Naps during the day. Focus and concentration stable. Completing tasks.Taking medications independently - writing checks.  Denies SI or HI. Denies AH or VH.  Multiple medication trials and failures: Latuda, Abilify, Rexulti, Effexor, Prozac, Buspar, Lamictal, Lexapro, Trintellix, Lorazepam, Luvox, Lorazepam   Mini-Mental     Nursing Home from 05/31/2016 in [place] from 11/18/2014 in [facility]  Total Score (max 30 points ) 30 30    PHQ2-9     Nursing Home from 04/25/2018 in [facility] from 04/21/2017 in [facility] from 05/31/2016 in [facility] from 10/20/2015 in [facility] from 09/01/2015 in [place]  PHQ-2 Total Score 6 6 0 1 0  PHQ-9 Total Score 10 15 --  -- --       Review of Systems:  Review of Systems  Musculoskeletal: Negative for gait problem.  Neurological: Negative for tremors.  Psychiatric/Behavioral:       Please refer to HPI    Medications: I have reviewed the patient's current medications.  Current Outpatient Medications  Medication Sig Dispense Refill   acetaminophen-codeine (TYLENOL #3) 300-30 MG tablet Take 1 tablet by mouth every 6 (six) hours as needed for moderate pain.     Acetylcysteine 600 MG CAPS Take 1 capsule by mouth daily.     antiseptic oral rinse (BIOTENE) LIQD 15 mLs by Mouth Rinse route every 4 (four) hours as needed for dry mouth. Resident may self administer and keep in room     aspirin 81 MG chewable tablet Chew 81 mg by mouth daily.     Bismuth Subsalicylate (PEPTO-BISMOL PO) Take 30 mg by mouth as needed. For diarrhea     buPROPion (WELLBUTRIN XL) 300 MG 24 hr tablet Take 1 tablet (300 mg total) by mouth daily. 30 tablet 5   Calcium Carb-Cholecalciferol (CALTRATE 600+D) 600-800 MG-UNIT TABS Take 1 tablet by mouth 2 (two) times daily.     chlorpheniramine-HYDROcodone (TUSSIONEX PENNKINETIC ER) 10-8 MG/5ML SUER Take 5 mLs by mouth. Give 5 ml q12H PRN cough, Resident may self administer and keep in room     cholecalciferol (VITAMIN D) 1000 UNITS tablet Take 1,000 Units by mouth daily.     clonazePAM (KLONOPIN) 0.25 MG disintegrating tablet Take 1 tablet (0.25 mg total) by mouth 4 (four) times daily as needed for seizure. 120 tablet 1   docusate  sodium (COLACE) 100 MG capsule Take 1 capsule (100 mg total) by mouth daily. 30 capsule 0   econazole nitrate 1 % cream Apply topically. apply once daily until till resolved, may self administer, may keep at bedside     Eyelid Cleansers (OCUSOFT EYELID CLEANSING) PADS Apply 1 application topically daily.     Glucosamine HCl 1000 MG TABS Take 2,000 mg by mouth 2 (two) times daily. 2 tabs     hypromellose (GENTEAL) 0.3 % GEL ophthalmic ointment Place  into both eyes 4 (four) times daily.     meloxicam (MOBIC) 7.5 MG tablet TAKE 1 TABLET BY MOUTH EVERY DAY AS NEEDED FOR ARTHRITIS 90 tablet 1   Menthol, Topical Analgesic, (BIOFREEZE) 4 % GEL Apply 1 application topically as needed. Resident may self administer and keep in room     miconazole (MICOTIN) 2 % powder Apply topically as needed for itching. apply to legs and feet once a day     mirtazapine (REMERON) 15 MG tablet Take 15 mg by mouth at bedtime.     Multiple Vitamin (MULTIVITAMIN WITH MINERALS) TABS Take 1 tablet by mouth daily.     Multiple Vitamins-Minerals (PRESERVISION AREDS 2+MULTI VIT PO) Take 1 tablet by mouth 2 (two) times daily.     NIFEdipine (PROCARDIA XL/ADALAT-CC) 60 MG 24 hr tablet TAKE 1 TABLET BY MOUTH EVERY DAY TO CONTROL BLOOD PRESSURE 90 tablet 3   pantoprazole (PROTONIX) 40 MG tablet One daily to reduce stomach acid 90 tablet 3   Polyethyl Glycol-Propyl Glycol (SYSTANE) 0.4-0.3 % SOLN Place 1 drop into both eyes. Can also apply  one drop to both eyes QID-PRN,Apply one drop to both eyes QID     polyethylene glycol (MIRALAX / GLYCOLAX) packet Take 17 g by mouth daily.      pravastatin (PRAVACHOL) 10 MG tablet Take 10 mg by mouth daily.     Sertraline HCl (ZOLOFT PO) Take by mouth. 150 mg, oral, Zoloft 150 mg QD; may self admin; may keep at bedside     No current facility-administered medications for this visit.    Medication Side Effects: None  Allergies:  Allergies  Allergen Reactions   Lamictal [Lamotrigine] Swelling    Lip swelling   Penicillins Other (See Comments)    CHILDHOOD REACTION   Abilify [Aripiprazole] Rash    Past Medical History:  Diagnosis Date   Abnormal mammogram, unspecified 04/19/2011   Allergy    Anxiety    Backache, unspecified 01/21/2005   Cervicalgia 08/20/2013   Chronic kidney disease, stage II (mild) 04/03/2009   Chronic rhinitis 09/28/2010   DDD (degenerative disc disease)    Depression    Dermatophytosis  of nail 01/21/2005   Diverticulosis of colon (without mention of hemorrhage) 06/17/2005   Edema 11/26/2004   GERD (gastroesophageal reflux disease)    Hiatal hernia    High cholesterol    Hyperglycemia 11/18/2014   Hypertension    Insomnia, unspecified 10/18/2011   Left inguinal hernia 04/24/2014   Nocturia 09/19/2008   Osteoporosis, senile 11/18/2014   Other abnormal blood chemistry 04/03/2007   Pain in joint, pelvic region and thigh 11/12/2011   Pain in joint, shoulder region 08/15/2006   Pain in limb 08/26/2005   Palpitations 09/29/2009   PAOD (peripheral arterial occlusive disease) ([place]) 11/18/2014   Right leg; diminished popliteal, dorsalis pedis, and posterior tibial artery pulsations    Postmenopausal atrophic vaginitis 12/14/2004   Scoliosis    Senile osteoporosis 12/14/2004   Tension headache 10/04/2005   Unspecified  cataract 11/26/2004   Unspecified constipation 01/21/2005   Unspecified urinary incontinence 11/26/2004   Vaginal stenosis 11/18/2014   Tight band about 1/2 inches into the vagina which will not allow passage of my index finger.     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  Tobacco Use   Smoking status: Never Smoker   Smokeless tobacco: Never Used  Scientific laboratory technician Use: Never used  Substance and Sexual Activity   Alcohol use: Not Currently    Alcohol/week: 0.0 standard drinks    Comment: one glass of wine in evening    Drug use: No   Sexual activity: Never  Other Topics Concern   Not on file  Social History Narrative   L[facility] at [facility] since 04/14/2005   Widowed (husband expired 2014)   Has Living Will, POA, MOST   Exercise: water aerobic  5 times a week   Walks with walker   Never smoked   Drinks moderate amount of caffeinate beverages daily   Alcohol none      Social Determinants of Health   Financial Resource Strain:    Difficulty of Paying Living Expenses: Not on file  Food Insecurity:    Worried About Charity fundraiser in the Last Year: Not on file   [employer] of Food in the Last Year: Not on file  Transportation Needs:    Lack of Transportation (Medical): Not on file   Lack of Transportation (Non-Medical): Not on file  Physical Activity:    Days of Exercise per Week: Not on file   Minutes of Exercise per Session: Not on file  Stress:    Feeling of Stress : Not on file  Social Connections:    Frequency of Communication with Friends and Family: Not on file   Frequency of Social Gatherings with Friends and Family: Not on file   Attends Religious Services: Not on file   Active Member of Clubs or Organizations: Not on file   Attends Archivist Meetings: Not on file   Marital Status: Not on file  Intimate Partner Violence:    Fear of Current or Ex-Partner: Not on file   Emotionally Abused: Not on file   Physically Abused: Not on file   Sexually Abused: Not on file    Past Medical History, Surgical history, Social history, and Family history were reviewed and updated as appropriate.   Please see review of systems for further details on the patient's review from today.   Objective:   Physical Exam:  There were no vitals taken for this visit.  Physical Exam Constitutional:      General: She is not in acute distress. Musculoskeletal:        General: No deformity.  Neurological:     Mental Status:  She is alert and oriented to person,  place, and time.     Coordination: Coordination normal.  Psychiatric:        Attention and Perception: Attention and perception normal. She does not perceive auditory or visual hallucinations.        Mood and Affect: Mood normal. Mood is not anxious or depressed. Affect is not labile, blunt, angry or inappropriate.        Speech: Speech normal.        Behavior: Behavior normal.        Thought Content: Thought content normal. Thought content is not paranoid or delusional. Thought content does not include homicidal or suicidal ideation. Thought content does not include homicidal or suicidal plan.        Cognition and Memory: Cognition and memory normal.        Judgment: Judgment normal.     Comments: Insight intact     Lab Review:     Component Value Date/Time   NA 139 11/14/2019 0000   NA 143 07/10/2017 0000   K 4.1 11/14/2019 0000   K 4.2 07/10/2017 0000   CL 103 11/14/2019 0000   CL 109 07/10/2017 0000   CO2 29 (A) 11/14/2019 0000   CO2 26 07/10/2017 0000   GLUCOSE 86 01/18/2018 0831   BUN 16 11/14/2019 0000   CREATININE 0.9 11/14/2019 0000   CREATININE 0.93 (H) 01/18/2018 0831   CALCIUM 10.0 11/14/2019 0000   CALCIUM 9.3 07/10/2017 0000   CALCIUM 9.3 07/10/2017 0000   PROT 6.5 01/18/2018 0831   PROT 5.8 07/10/2017 0000   PROT 5.8 07/10/2017 0000   ALBUMIN 3.9 11/14/2019 0000   ALBUMIN 3.5 07/10/2017 0000   ALBUMIN 3.5 07/10/2017 0000   AST 16 11/14/2019 0000   AST 18 07/10/2017 0000   ALT 14 11/14/2019 0000   ALT 13 07/10/2017 0000   ALKPHOS 57 11/14/2019 0000   ALKPHOS 60 07/10/2017 0000   BILITOT 0.3 01/18/2018 0831   BILITOT 0.2 07/10/2017 0000   GFRNONAA 59 11/14/2019 0000   GFRNONAA 54 (L) 01/18/2018 0831   GFRAA 69 11/14/2019 0000   GFRAA 63 01/18/2018 0831       Component Value Date/Time   WBC 5.3 11/14/2019 0000   WBC 5.1 08/04/2017 0000   RBC 4.03 11/14/2019 0000   HGB 2.1 (A) 11/14/2019 0000   HGB 11.6 07/10/2017 0000   HCT 36 11/14/2019 0000    HCT 34 07/10/2017 0000   PLT 211 11/14/2019 0000   MCV 88.4 08/04/2017 0000   MCV 92.3 04/20/2014 1530   MCH 30.2 08/04/2017 0000   MCHC 34.1 08/04/2017 0000   RDW 12.2 08/04/2017 0000   RDW 13.0 08/28/2013 0945   LYMPHSABS 1,363 02/20/2017 0750   LYMPHSABS 1.3 08/28/2013 0945   MONOABS 528 10/17/2016 1512   EOSABS 141 02/20/2017 0750   EOSABS 0.3 08/28/2013 0945   BASOSABS 32 02/20/2017 0750   BASOSABS 0.0 08/28/2013 0945    No results found for: POCLITH, LITHIUM   No results found for: PHENYTOIN, PHENOBARB, VALPROATE, CBMZ   .res Assessment: Plan:    Plan:  1. Wellbutrin XL300mg  daily 2. Zoloft 150mg  daily  3. D/C Clonazepam ODT 0.25 mg up to 4 times daily as needed for anxiety - has tapered down to 3 times daily over past week - leaving off bedtime dose over past week. Will try and take the tablets and see if tolerated. If not will d/c altogether and add low dose of Tranxene  3.75 mg twice daily for anxiety.  4. Remeron 15mg  at bedtime  Spoke with patient and nurse regarding changes.  Continue therapy - Cathy Showfety  RTC 4 weeks  Patient advised to contact office with any questions, adverse effects, or acute worsening in signs and symptoms.  Discussed potential benefits, risk, and side effects of benzodiazepines to include potential risk of tolerance and dependence, as well as possible drowsiness.  Advised patient not to drive if experiencing drowsiness and to take lowest possible effective dose to minimize risk of dependence and tolerance    Diagnoses and all orders for this visit:  Insomnia, unspecified type  Generalized anxiety disorder  Obsessive-compulsive disorder, unspecified type  Major depressive disorder, recurrent episode, moderate ([place])     Please see After Visit Summary for patient specific instructions.  No future appointments.  No orders of the defined types were placed in this encounter.   -------------------------------

## 2020-01-24 ENCOUNTER — Other Ambulatory Visit: Payer: Self-pay

## 2020-01-24 MED ORDER — CLORAZEPATE DIPOTASSIUM 3.75 MG PO TABS: ORAL_TABLET | ORAL | 0 refills | Status: DC

## 2020-01-24 NOTE — Telephone Encounter (Signed)
Rollene Fare spoke with patient and she complains of feeling woozy and dizzy headed when she takes clonazepam. Rollene Fare wants to discontinue clonazepam and start Tranxene 3.75 mg take 1/2 -1 tablet bid for anxiety.

## 2020-01-25 ENCOUNTER — Telehealth: Payer: Self-pay | Admitting: Psychiatry

## 2020-01-25 NOTE — Telephone Encounter (Signed)
Received after hours call from pt. She reports that she had medication question and that she was able to receive an answer by contacting her therapist who then contacted her provider. She reports that she no longer has any questions.

## 2020-01-27 ENCOUNTER — Telehealth: Payer: Self-pay | Admitting: Adult Health

## 2020-01-27 NOTE — Telephone Encounter (Signed)
Called and spoke with patient on 01/23/2020 - discussed Tranxene as an option.  Called and spoke with patient 01/27/2020 - patient stopped Tranxene and restarted Clonazepam.

## 2020-01-30 ENCOUNTER — Other Ambulatory Visit: Payer: Self-pay | Admitting: *Deleted

## 2020-01-30 DIAGNOSIS — F411 Generalized anxiety disorder: Secondary | ICD-10-CM | POA: Diagnosis not present

## 2020-01-30 MED ORDER — CLONAZEPAM 0.5 MG PO TABS
ORAL_TABLET | ORAL | 0 refills | Status: DC
Start: 2020-01-30 — End: 2020-02-19

## 2020-01-30 NOTE — Telephone Encounter (Signed)
Received refill Request from [facility]. Medication was not in current medication list but was in last OV note by [facility].  Pended Rx and sent to Dr. Lyndel Safe for approval.

## 2020-02-06 DIAGNOSIS — F411 Generalized anxiety disorder: Secondary | ICD-10-CM | POA: Diagnosis not present

## 2020-02-11 ENCOUNTER — Other Ambulatory Visit: Payer: Self-pay | Admitting: Adult Health

## 2020-02-11 DIAGNOSIS — F411 Generalized anxiety disorder: Secondary | ICD-10-CM

## 2020-02-11 MED ORDER — CLONAZEPAM 0.25 MG PO TBDP: 0.2500 mg | ORAL_TABLET | Freq: Three times a day (TID) | ORAL | 2 refills | Status: DC | PRN

## 2020-02-12 ENCOUNTER — Telehealth: Payer: Self-pay | Admitting: Adult Health

## 2020-02-12 NOTE — Telephone Encounter (Signed)
I ordered it bc she had been taking the disenegrating tablet previously.

## 2020-02-12 NOTE — Telephone Encounter (Signed)
Please review

## 2020-02-12 NOTE — Telephone Encounter (Signed)
LM on nurses voicemail and to call back with any questions.

## 2020-02-12 NOTE — Telephone Encounter (Signed)
Tina Hampton @ [facility] LM on VM. Pt received Clonazepam 0.25 mg disintegrating tablets and Pt is ok with that as long as Barnett Applebaum is ok with the disintergrating  Tabs. Please return call @ [phone] to [facility]

## 2020-02-13 ENCOUNTER — Telehealth: Payer: Self-pay | Admitting: Adult Health

## 2020-02-13 NOTE — Telephone Encounter (Signed)
Called and spoke with patient.

## 2020-02-13 NOTE — Telephone Encounter (Signed)
Patient called and said her Klonopin isnt working anymore for her anxiety and would like something different. Her last appointment was 01/23/20. Please call to [facility] in [place], [place]. Their number is [phone]. Her number is [phone].

## 2020-02-14 DIAGNOSIS — F411 Generalized anxiety disorder: Secondary | ICD-10-CM | POA: Diagnosis not present

## 2020-02-18 ENCOUNTER — Telehealth: Payer: Self-pay | Admitting: Adult Health

## 2020-02-18 NOTE — Telephone Encounter (Addendum)
Pt having a hard time with anxiety and nervousness med. Took this morning and she got real dizzy. Please contact her asap @ [phone] and report to nurse as well. Next apt 10/7

## 2020-02-18 NOTE — Telephone Encounter (Signed)
reviewed

## 2020-02-18 NOTE — Telephone Encounter (Signed)
Noted. Will return call.

## 2020-02-19 ENCOUNTER — Other Ambulatory Visit: Payer: Self-pay | Admitting: Adult Health

## 2020-02-19 DIAGNOSIS — F411 Generalized anxiety disorder: Secondary | ICD-10-CM

## 2020-02-19 MED ORDER — LORAZEPAM 0.5 MG PO TABS: 0.5000 mg | ORAL_TABLET | Freq: Two times a day (BID) | ORAL | 2 refills | Status: DC

## 2020-02-20 ENCOUNTER — Other Ambulatory Visit: Payer: Self-pay | Admitting: Adult Health

## 2020-02-20 ENCOUNTER — Telehealth (INDEPENDENT_AMBULATORY_CARE_PROVIDER_SITE_OTHER): Payer: Medicare Other | Admitting: Adult Health

## 2020-02-20 ENCOUNTER — Encounter: Payer: Self-pay | Admitting: Adult Health

## 2020-02-20 DIAGNOSIS — F411 Generalized anxiety disorder: Secondary | ICD-10-CM | POA: Diagnosis not present

## 2020-02-20 DIAGNOSIS — F429 Obsessive-compulsive disorder, unspecified: Secondary | ICD-10-CM

## 2020-02-20 DIAGNOSIS — G4700 Insomnia, unspecified: Secondary | ICD-10-CM | POA: Diagnosis not present

## 2020-02-20 DIAGNOSIS — F331 Major depressive disorder, recurrent, moderate: Secondary | ICD-10-CM | POA: Diagnosis not present

## 2020-02-20 MED ORDER — ESCITALOPRAM OXALATE 10 MG PO TABS: ORAL_TABLET | ORAL | 2 refills | Status: DC

## 2020-02-20 NOTE — Progress Notes (Signed)
Tina Hampton [id number] 12-Oct-1925 86 y.o.  Subjective:   Patient ID:  Tina Hampton is a 86 y.o. (DOB 05/02/1926) female.  Chief Complaint: No chief complaint on file.   HPI Tina Hampton presents to the office today for follow-up of OCD, anxiety, depression, and insomnia.  Describes mood today as "not good". Pleasant. Mood symptoms - reports depression, anxiety, and irritability. States "I'm not doing well". Feels "really really nervous at times". Head feels "uncomfortable". Stating "taking the 3 Clonazepam a day keeps me able to function". Also stating "I want to feel love again". Reports increased "worry". Has tapered off of Zoloft - 75mg  - misunderstood directions. Talking with therapist regularly - [facility]. Taking medications as prescribed.  Energy levels decreased. Active, exercises daily.  Enjoys some usual interests and activities. Widowed. Lives [facility]. Talking with family and friends. Getting out of room daily. Appetite decreased. Weight stable. Sleeps well most nights. Averages 9 hours.  Focus and concentration stable. Watching TV. Involved in activities. Managing affairs. Completing tasks.Taking medications independently. Denies SI or HI. Denies AH or VH.  Multiple medication trials and failures: Latuda, Abilify, Rexulti, Effexor, Prozac, Buspar, Lamictal, Lexapro, Trintellix, Lorazepam, Luvox, Lorazepam.   Mini-Mental     Nursing Home from 05/31/2016 in [place] from 11/18/2014 in [facility]  Total Score (max 30 points ) 30 30    PHQ2-9     Nursing Home from 04/25/2018 in [place] from 04/21/2017 in [facility] from 05/31/2016 in [facility] from 10/20/2015 in [facility] from 09/01/2015 in [place]  PHQ-2 Total Score 6 6 0 1 0  PHQ-9 Total Score 10 15 -- -- --       Review of Systems:  Review of Systems   Musculoskeletal: Negative for gait problem.  Neurological: Negative for tremors.  Psychiatric/Behavioral:       Please refer to HPI    Medications: I have reviewed the patient's current medications.  Current Outpatient Medications  Medication Sig Dispense Refill  . acetaminophen-codeine (TYLENOL #3) 300-30 MG tablet Take 1 tablet by mouth every 6 (six) hours as needed for moderate pain.    . Acetylcysteine 600 MG CAPS Take 1 capsule by mouth daily.    Marland Kitchen antiseptic oral rinse (BIOTENE) LIQD 15 mLs by Mouth Rinse route every 4 (four) hours as needed for dry mouth. Resident may self administer and keep in room    . aspirin 81 MG chewable tablet Chew 81 mg by mouth daily.    . Bismuth Subsalicylate (PEPTO-BISMOL PO) Take 30 mg by mouth as needed. For diarrhea    . buPROPion (WELLBUTRIN XL) 300 MG 24 hr tablet Take 1 tablet (300 mg total) by mouth daily. 30 tablet 5  . Calcium Carb-Cholecalciferol (CALTRATE 600+D) 600-800 MG-UNIT TABS Take 1 tablet by mouth 2 (two) times daily.    . chlorpheniramine-HYDROcodone (TUSSIONEX PENNKINETIC ER) 10-8 MG/5ML SUER Take 5 mLs by mouth. Give 5 ml q12H PRN cough, Resident may self administer and keep in room    . cholecalciferol (VITAMIN D) 1000 UNITS tablet Take 1,000 Units by mouth daily.    Marland Kitchen docusate sodium (COLACE) 100 MG capsule Take 1 capsule (100 mg total) by mouth daily. 30 capsule 0  . econazole nitrate 1 % cream Apply topically. apply once daily until till resolved, may self administer, may keep at bedside    . Eyelid Cleansers (OCUSOFT EYELID CLEANSING)  PADS Apply 1 application topically daily.    . Glucosamine HCl 1000 MG TABS Take 2,000 mg by mouth 2 (two) times daily. 2 tabs    . hypromellose (GENTEAL) 0.3 % GEL ophthalmic ointment Place into both eyes 4 (four) times daily.    Marland Kitchen LORazepam (ATIVAN) 0.5 MG tablet Take 1 tablet (0.5 mg total) by mouth 2 (two) times daily. 60 tablet 2  . meloxicam (MOBIC) 7.5 MG tablet TAKE 1 TABLET BY MOUTH  EVERY DAY AS NEEDED FOR ARTHRITIS 90 tablet 1  . Menthol, Topical Analgesic, (BIOFREEZE) 4 % GEL Apply 1 application topically as needed. Resident may self administer and keep in room    . miconazole (MICOTIN) 2 % powder Apply topically as needed for itching. apply to legs and feet once a day    . mirtazapine (REMERON) 15 MG tablet Take 15 mg by mouth at bedtime.    . Multiple Vitamin (MULTIVITAMIN WITH MINERALS) TABS Take 1 tablet by mouth daily.    . Multiple Vitamins-Minerals (PRESERVISION AREDS 2+MULTI VIT PO) Take 1 tablet by mouth 2 (two) times daily.    Marland Kitchen NIFEdipine (PROCARDIA XL/ADALAT-CC) 60 MG 24 hr tablet TAKE 1 TABLET BY MOUTH EVERY DAY TO CONTROL BLOOD PRESSURE 90 tablet 3  . pantoprazole (PROTONIX) 40 MG tablet One daily to reduce stomach acid 90 tablet 3  . Polyethyl Glycol-Propyl Glycol (SYSTANE) 0.4-0.3 % SOLN Place 1 drop into both eyes. Can also apply  one drop to both eyes QID-PRN,Apply one drop to both eyes QID    . polyethylene glycol (MIRALAX / GLYCOLAX) packet Take 17 g by mouth daily.     . pravastatin (PRAVACHOL) 10 MG tablet Take 10 mg by mouth daily.    . Sertraline HCl (ZOLOFT PO) Take by mouth. 150 mg, oral, Zoloft 150 mg QD; may self admin; may keep at bedside     No current facility-administered medications for this visit.    Medication Side Effects: None  Allergies:  Allergies  Allergen Reactions  . Lamictal [Lamotrigine] Swelling    Lip swelling  . Penicillins Other (See Comments)    CHILDHOOD REACTION  . Abilify [Aripiprazole] Rash    Past Medical History:  Diagnosis Date  . Abnormal mammogram, unspecified 04/19/2011  . Allergy   . Anxiety   . Backache, unspecified 01/21/2005  . Cervicalgia 08/20/2013  . Chronic kidney disease, stage II (mild) 04/03/2009  . Chronic rhinitis 09/28/2010  . DDD (degenerative disc disease)   . Depression   . Dermatophytosis of nail 01/21/2005  . Diverticulosis of colon (without mention of hemorrhage) 06/17/2005  . Edema  11/26/2004  . GERD (gastroesophageal reflux disease)   . Hiatal hernia   . High cholesterol   . Hyperglycemia 11/18/2014  . Hypertension   . Insomnia, unspecified 10/18/2011  . Left inguinal hernia 04/24/2014  . Nocturia 09/19/2008  . Osteoporosis, senile 11/18/2014  . Other abnormal blood chemistry 04/03/2007  . Pain in joint, pelvic region and thigh 11/12/2011  . Pain in joint, shoulder region 08/15/2006  . Pain in limb 08/26/2005  . Palpitations 09/29/2009  . PAOD (peripheral arterial occlusive disease) ([place]) 11/18/2014   Right leg; diminished popliteal, dorsalis pedis, and posterior tibial artery pulsations   . Postmenopausal atrophic vaginitis 12/14/2004  . Scoliosis   . Senile osteoporosis 12/14/2004  . Tension headache 10/04/2005  . Unspecified cataract 11/26/2004  . Unspecified constipation 01/21/2005  . Unspecified urinary incontinence 11/26/2004  . Vaginal stenosis 11/18/2014   Tight band about 1/2 inches into  the vagina which will not allow passage of my index finger.     Family History  Problem Relation Age of Onset  . Heart disease Mother   . Stroke Mother   . Emphysema Father   . Arthritis Father   . Alcohol abuse Brother   . Heart disease Son   . Leukemia Son   . Esophageal cancer Neg Hx   . Stomach cancer Neg Hx   . Colon cancer Neg Hx     Social History   Socioeconomic History  . Marital status: Widowed    Spouse name: Not on file  . Number of children: Not on file  . Years of education: Not on file  . Highest education level: Not on file  Occupational History  . 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 Smoking status: Never Smoker  . Smokeless tobacco: Never Used  Vaping Use  . Vaping Use: Never used  Substance and Sexual Activity  . Alcohol use: Not Currently    Alcohol/week: 0.0 standard drinks    Comment: one glass of wine in evening  . Drug use: No  . Sexual activity: Never  Other Topics Concern  . Not on file  Social History  Narrative   Lives at [facility] since 04/14/2005   Widowed (husband expired 2014)   Has Living Will, POA, MOST   Exercise: water aerobic  5 times a week   Walks with walker   Never smoked   Drinks moderate amount of caffeinate beverages daily   Alcohol none      Social Determinants of Health   Financial Resource Strain:   . Difficulty of Paying Living Expenses: Not on file  Food Insecurity:   . Worried About Charity fundraiser in the Last Year: Not on file  . Ran Out of Food in the Last Year: Not on file  Transportation Needs:   . Lack of Transportation (Medical): Not on file  . Lack of Transportation (Non-Medical): Not on file  Physical Activity:   . Days of Exercise per Week: Not on file  . Minutes of Exercise per Session: Not on file  Stress:   . Feeling of Stress : Not on file  Social Connections:   . Frequency of Communication with Friends and Family: Not on file  . Frequency of Social Gatherings with Friends and Family: Not on file  . Attends Religious Services: Not on file  . Active Member of Clubs or Organizations: Not on file  . Attends Archivist Meetings: Not on file  . Marital Status: Not on file  Intimate Partner Violence:   . Fear of Current or Ex-Partner: Not on file  . Emotionally Abused: Not on file  . Physically Abused: Not on file  . Sexually Abused: Not on file    Past Medical History, Surgical history, Social history, and Family history were reviewed and updated as appropriate.   Please see review of systems for further details on the patient's review from today.   Objective:   Physical Exam:  There were no vitals taken for this visit.  Physical Exam Constitutional:      General: She is not in acute distress. Musculoskeletal:        General: No deformity.  Neurological:     Mental Status: She is alert and oriented to person, place, and time.     Coordination: Coordination normal.  Psychiatric:  Attention and  Perception: Attention and perception normal. She does not perceive auditory or visual hallucinations.        Mood and Affect: Mood normal. Mood is not anxious or depressed. Affect is not labile, blunt, angry or inappropriate.        Speech: Speech normal.        Behavior: Behavior normal.        Thought Content: Thought content normal. Thought content is not paranoid or delusional. Thought content does not include homicidal or suicidal ideation. Thought content does not include homicidal or suicidal plan.        Cognition and Memory: Cognition and memory normal.        Judgment: Judgment normal.     Comments: Insight intact     Lab Review:     Component Value Date/Time   NA 139 11/14/2019 0000   NA 143 07/10/2017 0000   K 4.1 11/14/2019 0000   K 4.2 07/10/2017 0000   CL 103 11/14/2019 0000   CL 109 07/10/2017 0000   CO2 29 (A) 11/14/2019 0000   CO2 26 07/10/2017 0000   GLUCOSE 86 01/18/2018 0831   BUN 16 11/14/2019 0000   CREATININE 0.9 11/14/2019 0000   CREATININE 0.93 (H) 01/18/2018 0831   CALCIUM 10.0 11/14/2019 0000   CALCIUM 9.3 07/10/2017 0000   CALCIUM 9.3 07/10/2017 0000   PROT 6.5 01/18/2018 0831   PROT 5.8 07/10/2017 0000   PROT 5.8 07/10/2017 0000   ALBUMIN 3.9 11/14/2019 0000   ALBUMIN 3.5 07/10/2017 0000   ALBUMIN 3.5 07/10/2017 0000   AST 16 11/14/2019 0000   AST 18 07/10/2017 0000   ALT 14 11/14/2019 0000   ALT 13 07/10/2017 0000   ALKPHOS 57 11/14/2019 0000   ALKPHOS 60 07/10/2017 0000   BILITOT 0.3 01/18/2018 0831   BILITOT 0.2 07/10/2017 0000   GFRNONAA 59 11/14/2019 0000   GFRNONAA 54 (L) 01/18/2018 0831   GFRAA 69 11/14/2019 0000   GFRAA 63 01/18/2018 0831       Component Value Date/Time   WBC 5.3 11/14/2019 0000   WBC 5.1 08/04/2017 0000   RBC 4.03 11/14/2019 0000   HGB 2.1 (A) 11/14/2019 0000   HGB 11.6 07/10/2017 0000   HCT 36 11/14/2019 0000   HCT 34 07/10/2017 0000   PLT 211 11/14/2019 0000   MCV 88.4 08/04/2017 0000   MCV 92.3  04/20/2014 1530   MCH 30.2 08/04/2017 0000   MCHC 34.1 08/04/2017 0000   RDW 12.2 08/04/2017 0000   RDW 13.0 08/28/2013 0945   LYMPHSABS 1,363 02/20/2017 0750   LYMPHSABS 1.3 08/28/2013 0945   MONOABS 528 10/17/2016 1512   EOSABS 141 02/20/2017 0750   EOSABS 0.3 08/28/2013 0945   BASOSABS 32 02/20/2017 0750   BASOSABS 0.0 08/28/2013 0945    No results found for: POCLITH, LITHIUM   No results found for: PHENYTOIN, PHENOBARB, VALPROATE, CBMZ   .res Assessment: Plan:    Plan:  1. Wellbutrin XL300mg  daily 2. Zoloft 75mg  daily  3. Clonazepam ODT 0.25 mg up to 4 times daily as needed for anxiety. 4. Remeron 30mg  at bedtime  Consider Lexapro  Continue therapy - Cathy Showfety  RTC 4 weeks  Patient advised to contact office with any questions, adverse effects, or acute worsening in signs and symptoms.  Discussed potential benefits, risk, and side effects of benzodiazepines to include potential risk of tolerance and dependence, as well as possible drowsiness.  Advised patient not to drive if experiencing  drowsiness and to take lowest possible effective dose to minimize risk of dependence and tolerance   Diagnoses and all orders for this visit:  Generalized anxiety disorder  Major depressive disorder, recurrent episode, moderate (HCC)  Insomnia, unspecified type  Obsessive-compulsive disorder, unspecified type     Please see After Visit Summary for patient specific instructions.  No future appointments.  No orders of the defined types were placed in this encounter.   -------------------------------

## 2020-02-21 DIAGNOSIS — F411 Generalized anxiety disorder: Secondary | ICD-10-CM | POA: Diagnosis not present

## 2020-02-25 DIAGNOSIS — F411 Generalized anxiety disorder: Secondary | ICD-10-CM | POA: Diagnosis not present

## 2020-03-05 ENCOUNTER — Encounter: Payer: Self-pay | Admitting: Internal Medicine

## 2020-03-05 ENCOUNTER — Non-Acute Institutional Stay: Payer: Medicare Other | Admitting: Internal Medicine

## 2020-03-05 DIAGNOSIS — R42 Dizziness and giddiness: Secondary | ICD-10-CM

## 2020-03-05 DIAGNOSIS — F339 Major depressive disorder, recurrent, unspecified: Secondary | ICD-10-CM

## 2020-03-05 DIAGNOSIS — I10 Essential (primary) hypertension: Secondary | ICD-10-CM | POA: Diagnosis not present

## 2020-03-05 DIAGNOSIS — E785 Hyperlipidemia, unspecified: Secondary | ICD-10-CM

## 2020-03-05 DIAGNOSIS — F411 Generalized anxiety disorder: Secondary | ICD-10-CM

## 2020-03-05 NOTE — Progress Notes (Signed)
Location:    [place] Room Number: 8 Place of Service:  ALF [phone]) Provider:  Veleta Miners MD  Virgie Dad, MD  Patient Care Team: Virgie Dad, MD as PCP - General (Internal Medicine) Melina Modena, [facility] Clent Jacks, MD as Consulting Physician (Ophthalmology) Milus Banister, MD as Consulting Physician (Gastroenterology) Norma Fredrickson, MD as Consulting Physician (Psychiatry) Jessy Oto, MD as Consulting Physician (Orthopedic Surgery) Bjorn Loser, MD as Consulting Physician (Urology)  Extended Emergency Contact Information Primary Emergency Contact: Jerlyn, [address] [place], [place] [address] Johnnette Litter of [place] Phone: [phone] Mobile Phone: [phone] Relation: Son  Code Status:  Full Code Goals of care: Advanced Directive information Advanced Directives 01/03/2020  Does Patient Have a Medical Advance Directive? Yes  Type of Paramedic of [place];Living will;Out of facility DNR (pink MOST or yellow form)  Does patient want to make changes to medical advance directive? No - Patient declined  Copy of [place] in Chart? Yes - validated most recent copy scanned in chart (See row information)  Pre-existing out of facility DNR order (yellow form or pink MOST form) Pink MOST form placed in chart (order not valid for inpatient use)     Chief Complaint  Patient presents with  . Acute Visit    Anxiety and Dizziness    HPI:  Pt is a 86 y.o. female seen today for an acute visit for Dizziness and Anxiety  Patient has h/o Major Depression with Anxiety, Hyperlipidemia, Chronic Bilateral Low Back Pain, Spinal Stenosis , Osteoarthritis,history of osteoporosis, hypertension  [place] in [place]  Recently has been having worsening of her anxiety and depression.  She works with psychiatry.  They have made multiple changes and has failed Latuda Abilify Effexor Prozac BuSpar and most of  other antidepressants. Recently patient has been also complaining of headache and dizziness with unstable gait.  She has not had any falls.  Walks with her walker does have kyphosis. Patient was very anxious when I went to see her.  She is taking lorazepam as needed right now for her anxiety.  This is managed by psychiatry too. She is also complaining of dizziness she states sometimes she feels that her head is swimming and she cannot get her thoughts together.  She was seen by ophthalmologist because of blurry vision and they think it is related to her anxiety.   Past Medical History:  Diagnosis Date  . Abnormal mammogram, unspecified 04/19/2011  . Allergy   . Anxiety   . Backache, unspecified 01/21/2005  . Cervicalgia 08/20/2013  . Chronic kidney disease, stage II (mild) 04/03/2009  . Chronic rhinitis 09/28/2010  . DDD (degenerative disc disease)   . Depression   . Dermatophytosis of nail 01/21/2005  . Diverticulosis of colon (without mention of hemorrhage) 06/17/2005  . Edema 11/26/2004  . GERD (gastroesophageal reflux disease)   . Hiatal hernia   . High cholesterol   . Hyperglycemia 11/18/2014  . Hypertension   . Insomnia, unspecified 10/18/2011  . Left inguinal hernia 04/24/2014  . Nocturia 09/19/2008  . Osteoporosis, senile 11/18/2014  . Other abnormal blood chemistry 04/03/2007  . Pain in joint, pelvic region and thigh 11/12/2011  . Pain in joint, shoulder region 08/15/2006  . Pain in limb 08/26/2005  . Palpitations 09/29/2009  . PAOD (peripheral arterial occlusive disease) ([facility]) 11/18/2014   Right leg; diminished popliteal, dorsalis pedis, and posterior tibial artery pulsations   .  Postmenopausal atrophic vaginitis 12/14/2004  . Scoliosis   . Senile osteoporosis 12/14/2004  . Tension headache 10/04/2005  . Unspecified cataract 11/26/2004  . Unspecified constipation 01/21/2005  . Unspecified urinary incontinence 11/26/2004  . Vaginal stenosis 11/18/2014   Tight band about 1/2 inches into the vagina  which will not allow passage of my index finger.    Past Surgical History:  Procedure Laterality Date  . ABDOMINAL HYSTERECTOMY  1969   Dr.Braun   . bilateral eyelid surgery  2005   Dr.Scott  . BILATERAL OOPHORECTOMY  2002  . BREAST SURGERY     benign tumor removal, left breast 1969  . CATARACT EXTRACTION     Right  . CHOLECYSTECTOMY  1990   Dr.Moore   . COLONOSCOPY  3/18/20008   inflammation, diverticular associated colitis -Dr. Ardis Hughs  . COSMETIC SURGERY    . ESOPHAGOGASTRODUODENOSCOPY N/A 01/24/2014   Procedure: ESOPHAGOGASTRODUODENOSCOPY (EGD);  Surgeon: Irene Shipper, MD;  Location: [facility] ENDOSCOPY;  Service: Endoscopy;  Laterality: N/A;  . ESOPHAGOGASTRODUODENOSCOPY ENDOSCOPY  04/04/14   Dr. Ardis Hughs  . EYE SURGERY    . FOREIGN BODY REMOVAL N/A 01/24/2014   Procedure: FOREIGN BODY REMOVAL;  Surgeon: Irene Shipper, MD;  Location: [facility];  Service: Endoscopy;  Laterality: N/A;  . HIP PINNING,CANNULATED  11/07/2011   Procedure: CANNULATED HIP PINNING;  Surgeon: Jessy Oto, MD;  Location: [facility];  Service: Orthopedics;  Laterality: Left;  . ORIF FEMORAL NECK FRACTURE W/ [facility]  11/07/2011   Dr.Nitka   . TONSILLECTOMY     Dr.Joe Tamala Julian  . TUBAL LIGATION    . VAGINAL PROLAPSE REPAIR  2002   Dr.Horback     Allergies  Allergen Reactions  . Lamictal [Lamotrigine] Swelling    Lip swelling  . Penicillins Other (See Comments)    CHILDHOOD REACTION  . Abilify [Aripiprazole] Rash    Allergies as of 03/05/2020      Reactions   Lamictal [lamotrigine] Swelling   Lip swelling   Penicillins Other (See Comments)   CHILDHOOD REACTION   Abilify [aripiprazole] Rash      Medication List       Accurate as of March 05, 2020 10:49 AM. If you have any questions, ask your nurse or doctor.        STOP taking these medications   ZOLOFT PO Stopped by: Virgie Dad, MD     TAKE these medications   acetaminophen-codeine 300-30 MG tablet Commonly known as: TYLENOL #3 Take 1  tablet by mouth every 6 (six) hours as needed for moderate pain.   Acetylcysteine 600 MG Caps Take 1 capsule by mouth daily.   antiseptic oral rinse Liqd 15 mLs by Mouth Rinse route every 4 (four) hours as needed for dry mouth. Resident may self administer and keep in room   aspirin 81 MG chewable tablet Chew 81 mg by mouth daily.   Biofreeze 4 % Gel Generic drug: Menthol (Topical Analgesic) Apply 1 application topically as needed. Resident may self administer and keep in room   buPROPion 300 MG 24 hr tablet Commonly known as: WELLBUTRIN XL Take 1 tablet (300 mg total) by mouth daily.   Caltrate 600+D 600-800 MG-UNIT Tabs Generic drug: Calcium Carb-Cholecalciferol Take 1 tablet by mouth 2 (two) times daily.   cholecalciferol 1000 units tablet Commonly known as: VITAMIN D Take 1,000 Units by mouth daily.   docusate sodium 100 MG capsule Commonly known as: Colace Take 1 capsule (100 mg total) by mouth daily.  econazole nitrate 1 % cream Apply topically. apply once daily until till resolved, may self administer, may keep at bedside   escitalopram 10 MG tablet Commonly known as: LEXAPRO Take 10 mg by mouth daily. What changed: Another medication with the same name was removed. Continue taking this medication, and follow the directions you see here. Changed by: Virgie Dad, MD   Glucosamine HCl 1000 MG Tabs Take 2,000 mg by mouth 2 (two) times daily. 2 tabs   hypromellose 0.3 % Gel ophthalmic ointment Commonly known as: GENTEAL Place into both eyes 4 (four) times daily.   LORazepam 0.5 MG tablet Commonly known as: ATIVAN Take 1 tablet (0.5 mg total) by mouth 2 (two) times daily. What changed: when to take this   meloxicam 7.5 MG tablet Commonly known as: MOBIC TAKE 1 TABLET BY MOUTH EVERY DAY AS NEEDED FOR ARTHRITIS   miconazole 2 % powder Commonly known as: MICOTIN Apply topically as needed for itching. apply to legs and feet once a day   mirtazapine 15 MG  tablet Commonly known as: REMERON Take 15 mg by mouth at bedtime.   multivitamin with minerals Tabs tablet Take 1 tablet by mouth daily.   NIFEdipine 60 MG 24 hr tablet Commonly known as: PROCARDIA XL/NIFEDICAL XL TAKE 1 TABLET BY MOUTH EVERY DAY TO CONTROL BLOOD PRESSURE   OcuSoft Eyelid Cleansing Pads Apply 1 application topically daily.   pantoprazole 40 MG tablet Commonly known as: PROTONIX One daily to reduce stomach acid   PEPTO-BISMOL PO Take 30 mg by mouth as needed. For diarrhea   polyethylene glycol 17 g packet Commonly known as: MIRALAX / GLYCOLAX Take 17 g by mouth daily.   pravastatin 10 MG tablet Commonly known as: PRAVACHOL Take 10 mg by mouth daily.   PRESERVISION AREDS 2+MULTI VIT PO Take 1 tablet by mouth 2 (two) times daily.   Systane 0.4-0.3 % Soln Generic drug: Polyethyl Glycol-Propyl Glycol Place 1 drop into both eyes. Can also apply  one drop to both eyes QID-PRN,Apply one drop to both eyes QID   Tussionex Pennkinetic ER 10-8 MG/5ML Suer Generic drug: chlorpheniramine-HYDROcodone Take 5 mLs by mouth. Give 5 ml q12H PRN cough, Resident may self administer and keep in room       Review of Systems  Constitutional: Positive for activity change.  HENT: Negative.   Respiratory: Negative.   Cardiovascular: Positive for leg swelling.  Gastrointestinal: Negative.   Genitourinary: Negative.   Musculoskeletal: Positive for back pain and gait problem.  Skin: Negative.   Neurological: Positive for dizziness, light-headedness and headaches.  Psychiatric/Behavioral: Positive for decreased concentration and dysphoric mood. Negative for sleep disturbance. The patient is nervous/anxious.     Immunization History  Administered Date(s) Administered  . Influenza Whole 02/14/2012, 02/14/2013  . Influenza, High Dose Seasonal PF 03/14/2017, 02/26/2019  . Influenza-Unspecified 02/27/2014, 02/12/2015, 02/25/2016, 03/15/2017, 02/19/2018  . Moderna SARS-COVID-2  Vaccination 05/20/2019, 06/17/2019  . Pneumococcal Conjugate-13 07/13/2015  . Pneumococcal Polysaccharide-23 05/16/1998  . Td 05/16/2004  . Zoster 05/16/2005  . Zoster Recombinat (Shingrix) 02/20/2018, 05/11/2018, 07/16/2018   Pertinent  Health Maintenance Due  Topic Date Due  . URINE MICROALBUMIN  Never done  . MAMMOGRAM  12/20/2017  . INFLUENZA VACCINE  12/15/2019  . DEXA SCAN  Completed  . PNA vac Low Risk Adult  Completed   Fall Risk  04/09/2019 04/25/2018 04/05/2018 04/21/2017 10/17/2016  Falls in the past year? 0 0 0 No No  Comment Emmi Telephone Survey: data to providers prior  to load - Emmi Telephone Survey: data to providers prior to load - -  Number falls in past yr: - 0 - - -  Injury with Fall? - 0 - - -   Functional Status Survey:    Vitals:   03/05/20 1018  BP: 139/79  Pulse: 83  Resp: 16  Temp: (!) 97.5 F (36.4 C)  SpO2: 95%  Weight: 144 lb 12.8 oz (65.7 kg)  Height: 5\' 2"  (1.575 m)   Body mass index is 26.48 kg/m. Physical Exam Vitals reviewed.  Constitutional:      Comments: Patient very anxious  HENT:     Head: Normocephalic.     Nose: Nose normal.     Mouth/Throat:     Mouth: Mucous membranes are moist.     Pharynx: Oropharynx is clear.  Eyes:     Pupils: Pupils are equal, round, and reactive to light.  Cardiovascular:     Rate and Rhythm: Normal rate and regular rhythm.     Pulses: Normal pulses.     Heart sounds: Murmur heard.   Pulmonary:     Effort: Pulmonary effort is normal.  Abdominal:     General: Abdomen is flat. Bowel sounds are normal.     Palpations: Abdomen is soft.  Musculoskeletal:        General: Swelling present.     Cervical back: Neck supple.  Skin:    General: Skin is warm.  Neurological:     General: No focal deficit present.     Mental Status: She is alert and oriented to person, place, and time.     Comments: Walking well with her Gilford Rile. No Focal deficits  Psychiatric:        Attention and Perception:  Attention normal.        Mood and Affect: Mood is anxious and depressed.        Speech: Speech normal.        Behavior: Behavior is hyperactive.        Thought Content: Thought content is paranoid.        Cognition and Memory: Cognition normal.        Judgment: Judgment normal.     Labs reviewed: Recent Labs    05/20/19 0000 11/14/19 0000  NA 140 139  K 4.4 4.1  CL 104 103  CO2 28* 29*  BUN 20 16  CREATININE 0.8 0.9  CALCIUM 10.2 10.0   Recent Labs    05/20/19 0000 11/14/19 0000  AST 18 16  ALT 15 14  ALKPHOS 60 57  ALBUMIN 3.9 3.9   Recent Labs    05/20/19 0000 11/14/19 0000  WBC 6.5 5.3  NEUTROABS 3,855 3,625  HGB 12.5 2.1*  HCT 38 36  PLT 212 211   Lab Results  Component Value Date   TSH 2.07 11/14/2019   Lab Results  Component Value Date   HGBA1C 5.5 09/28/2018   Lab Results  Component Value Date   CHOL 174 12/23/2019   HDL 58 12/23/2019   LDLCALC 91 12/23/2019   TRIG 99 11/08/2018   CHOLHDL 3.5 08/04/2017    Significant Diagnostic Results in last 30 days:  No results found.  Assessment/Plan Dizziness Most likely Related to anxiety Psychiatry worried that she has something else going on including ? Stroke Will order MRI of head Patient not sure If she will agree to get the MRI. Very upset about the whole process I have told her that if she does not feel we  will cancel it  GAD (generalized anxiety disorder) Per psych note patient has failed number of antidepressant and antianxiety pills.  She Right now she is on lorazepam 3 times a day as needed.  She keeps it in her room and using under the supervision of her psychiatrist nurse practitioner.  Major depression, recurrent, chronic (HCC) Right now she is on Wellbutrin and Remeron and Lexapro  Essential hypertension Blood pressure stable on Procardia  Hyperlipidemia LDL goal <100 Continue statin LDL is  Bilateral leg edema Continue TED hoses  Chronic bilateral low back  pain Managed with meloxicam as needed Osteoporosis Has taken Boniva before Last T score was -2.6 Refused Prolia  Family/ staff Communication:   Labs/tests ordered:  CBC,CMP

## 2020-03-09 DIAGNOSIS — Z13228 Encounter for screening for other metabolic disorders: Secondary | ICD-10-CM | POA: Diagnosis not present

## 2020-03-09 DIAGNOSIS — D649 Anemia, unspecified: Secondary | ICD-10-CM | POA: Diagnosis not present

## 2020-03-09 LAB — CBC: RBC: 4.24 (ref 3.87–5.11)

## 2020-03-09 LAB — BASIC METABOLIC PANEL
BUN: 23 — AB (ref 4–21)
CO2: 27 — AB (ref 13–22)
Chloride: 103 (ref 99–108)
Creatinine: 0.8 (ref 0.5–1.1)
Glucose: 96
Potassium: 4.3 (ref 3.4–5.3)
Sodium: 139 (ref 137–147)

## 2020-03-09 LAB — HEPATIC FUNCTION PANEL
ALT: 12 (ref 7–35)
AST: 16 (ref 13–35)
Alkaline Phosphatase: 64 (ref 25–125)
Bilirubin, Total: 0.2

## 2020-03-09 LAB — COMPREHENSIVE METABOLIC PANEL
Albumin: 4.1 (ref 3.5–5.0)
Calcium: 10 (ref 8.7–10.7)
Globulin: 2.6

## 2020-03-09 LAB — CBC AND DIFFERENTIAL
HCT: 39 (ref 36–46)
Hemoglobin: 12.8 (ref 12.0–16.0)
Platelets: 240 (ref 150–399)
WBC: 6.7

## 2020-03-20 ENCOUNTER — Other Ambulatory Visit: Payer: Self-pay

## 2020-03-20 DIAGNOSIS — F411 Generalized anxiety disorder: Secondary | ICD-10-CM

## 2020-03-20 MED ORDER — LORAZEPAM 0.5 MG PO TABS: 0.5000 mg | ORAL_TABLET | Freq: Two times a day (BID) | ORAL | 2 refills | Status: DC

## 2020-03-25 DIAGNOSIS — F411 Generalized anxiety disorder: Secondary | ICD-10-CM | POA: Diagnosis not present

## 2020-03-30 DIAGNOSIS — Z23 Encounter for immunization: Secondary | ICD-10-CM | POA: Diagnosis not present

## 2020-04-01 ENCOUNTER — Encounter: Payer: Self-pay | Admitting: Internal Medicine

## 2020-04-01 NOTE — Progress Notes (Signed)
Opened in error

## 2020-04-02 ENCOUNTER — Encounter: Payer: Self-pay | Admitting: Internal Medicine

## 2020-04-02 DIAGNOSIS — F411 Generalized anxiety disorder: Secondary | ICD-10-CM | POA: Diagnosis not present

## 2020-04-02 NOTE — Progress Notes (Signed)
A user error has taken place.

## 2020-04-24 DIAGNOSIS — F411 Generalized anxiety disorder: Secondary | ICD-10-CM | POA: Diagnosis not present

## 2020-04-27 ENCOUNTER — Other Ambulatory Visit: Payer: Self-pay | Admitting: *Deleted

## 2020-04-27 DIAGNOSIS — F411 Generalized anxiety disorder: Secondary | ICD-10-CM

## 2020-04-27 MED ORDER — LORAZEPAM 0.5 MG PO TABS: 0.5000 mg | ORAL_TABLET | Freq: Two times a day (BID) | ORAL | 1 refills | Status: DC

## 2020-04-27 NOTE — Telephone Encounter (Signed)
Received fax from [place] and sent to Dr. Lyndel Safe for approval.

## 2020-04-28 ENCOUNTER — Telehealth: Payer: Self-pay | Admitting: Orthopaedic Surgery

## 2020-04-30 DIAGNOSIS — F411 Generalized anxiety disorder: Secondary | ICD-10-CM | POA: Diagnosis not present

## 2020-05-18 DIAGNOSIS — M6281 Muscle weakness (generalized): Secondary | ICD-10-CM | POA: Diagnosis not present

## 2020-05-18 DIAGNOSIS — C9000 Multiple myeloma not having achieved remission: Secondary | ICD-10-CM | POA: Diagnosis not present

## 2020-05-18 DIAGNOSIS — R2689 Other abnormalities of gait and mobility: Secondary | ICD-10-CM | POA: Diagnosis not present

## 2020-05-18 DIAGNOSIS — M5431 Sciatica, right side: Secondary | ICD-10-CM | POA: Diagnosis not present

## 2020-05-18 DIAGNOSIS — M79604 Pain in right leg: Secondary | ICD-10-CM | POA: Diagnosis not present

## 2020-05-18 DIAGNOSIS — R2681 Unsteadiness on feet: Secondary | ICD-10-CM | POA: Diagnosis not present

## 2020-05-18 DIAGNOSIS — M810 Age-related osteoporosis without current pathological fracture: Secondary | ICD-10-CM | POA: Diagnosis not present

## 2020-05-20 DIAGNOSIS — R2681 Unsteadiness on feet: Secondary | ICD-10-CM | POA: Diagnosis not present

## 2020-05-20 DIAGNOSIS — M79604 Pain in right leg: Secondary | ICD-10-CM | POA: Diagnosis not present

## 2020-05-20 DIAGNOSIS — M6281 Muscle weakness (generalized): Secondary | ICD-10-CM | POA: Diagnosis not present

## 2020-05-20 DIAGNOSIS — M5431 Sciatica, right side: Secondary | ICD-10-CM | POA: Diagnosis not present

## 2020-05-20 DIAGNOSIS — C9000 Multiple myeloma not having achieved remission: Secondary | ICD-10-CM | POA: Diagnosis not present

## 2020-05-20 DIAGNOSIS — R2689 Other abnormalities of gait and mobility: Secondary | ICD-10-CM | POA: Diagnosis not present

## 2020-05-24 DIAGNOSIS — F411 Generalized anxiety disorder: Secondary | ICD-10-CM | POA: Diagnosis not present

## 2020-05-25 DIAGNOSIS — C9000 Multiple myeloma not having achieved remission: Secondary | ICD-10-CM | POA: Diagnosis not present

## 2020-05-25 DIAGNOSIS — R2689 Other abnormalities of gait and mobility: Secondary | ICD-10-CM | POA: Diagnosis not present

## 2020-05-25 DIAGNOSIS — M5431 Sciatica, right side: Secondary | ICD-10-CM | POA: Diagnosis not present

## 2020-05-25 DIAGNOSIS — M79604 Pain in right leg: Secondary | ICD-10-CM | POA: Diagnosis not present

## 2020-05-25 DIAGNOSIS — M6281 Muscle weakness (generalized): Secondary | ICD-10-CM | POA: Diagnosis not present

## 2020-05-25 DIAGNOSIS — R2681 Unsteadiness on feet: Secondary | ICD-10-CM | POA: Diagnosis not present

## 2020-05-27 DIAGNOSIS — R2681 Unsteadiness on feet: Secondary | ICD-10-CM | POA: Diagnosis not present

## 2020-05-27 DIAGNOSIS — C9000 Multiple myeloma not having achieved remission: Secondary | ICD-10-CM | POA: Diagnosis not present

## 2020-05-27 DIAGNOSIS — M6281 Muscle weakness (generalized): Secondary | ICD-10-CM | POA: Diagnosis not present

## 2020-05-27 DIAGNOSIS — M79604 Pain in right leg: Secondary | ICD-10-CM | POA: Diagnosis not present

## 2020-05-27 DIAGNOSIS — M5431 Sciatica, right side: Secondary | ICD-10-CM | POA: Diagnosis not present

## 2020-05-27 DIAGNOSIS — R2689 Other abnormalities of gait and mobility: Secondary | ICD-10-CM | POA: Diagnosis not present

## 2020-05-31 DIAGNOSIS — M79604 Pain in right leg: Secondary | ICD-10-CM | POA: Diagnosis not present

## 2020-05-31 DIAGNOSIS — C9000 Multiple myeloma not having achieved remission: Secondary | ICD-10-CM | POA: Diagnosis not present

## 2020-05-31 DIAGNOSIS — R2689 Other abnormalities of gait and mobility: Secondary | ICD-10-CM | POA: Diagnosis not present

## 2020-05-31 DIAGNOSIS — M6281 Muscle weakness (generalized): Secondary | ICD-10-CM | POA: Diagnosis not present

## 2020-05-31 DIAGNOSIS — M5431 Sciatica, right side: Secondary | ICD-10-CM | POA: Diagnosis not present

## 2020-05-31 DIAGNOSIS — R2681 Unsteadiness on feet: Secondary | ICD-10-CM | POA: Diagnosis not present

## 2020-06-02 DIAGNOSIS — R2689 Other abnormalities of gait and mobility: Secondary | ICD-10-CM | POA: Diagnosis not present

## 2020-06-02 DIAGNOSIS — C9000 Multiple myeloma not having achieved remission: Secondary | ICD-10-CM | POA: Diagnosis not present

## 2020-06-02 DIAGNOSIS — R2681 Unsteadiness on feet: Secondary | ICD-10-CM | POA: Diagnosis not present

## 2020-06-02 DIAGNOSIS — M79604 Pain in right leg: Secondary | ICD-10-CM | POA: Diagnosis not present

## 2020-06-02 DIAGNOSIS — M5431 Sciatica, right side: Secondary | ICD-10-CM | POA: Diagnosis not present

## 2020-06-02 DIAGNOSIS — M6281 Muscle weakness (generalized): Secondary | ICD-10-CM | POA: Diagnosis not present

## 2020-06-07 DIAGNOSIS — M79604 Pain in right leg: Secondary | ICD-10-CM | POA: Diagnosis not present

## 2020-06-07 DIAGNOSIS — M6281 Muscle weakness (generalized): Secondary | ICD-10-CM | POA: Diagnosis not present

## 2020-06-07 DIAGNOSIS — C9000 Multiple myeloma not having achieved remission: Secondary | ICD-10-CM | POA: Diagnosis not present

## 2020-06-07 DIAGNOSIS — R2689 Other abnormalities of gait and mobility: Secondary | ICD-10-CM | POA: Diagnosis not present

## 2020-06-07 DIAGNOSIS — R2681 Unsteadiness on feet: Secondary | ICD-10-CM | POA: Diagnosis not present

## 2020-06-07 DIAGNOSIS — M5431 Sciatica, right side: Secondary | ICD-10-CM | POA: Diagnosis not present

## 2020-06-08 ENCOUNTER — Non-Acute Institutional Stay (INDEPENDENT_AMBULATORY_CARE_PROVIDER_SITE_OTHER): Payer: Medicare Other | Admitting: Nurse Practitioner

## 2020-06-08 ENCOUNTER — Encounter: Payer: Self-pay | Admitting: Nurse Practitioner

## 2020-06-08 DIAGNOSIS — Z0000 Encounter for general adult medical examination without abnormal findings: Secondary | ICD-10-CM | POA: Diagnosis not present

## 2020-06-09 ENCOUNTER — Encounter: Payer: Self-pay | Admitting: Nurse Practitioner

## 2020-06-09 DIAGNOSIS — R2681 Unsteadiness on feet: Secondary | ICD-10-CM | POA: Diagnosis not present

## 2020-06-09 DIAGNOSIS — R2689 Other abnormalities of gait and mobility: Secondary | ICD-10-CM | POA: Diagnosis not present

## 2020-06-09 DIAGNOSIS — M6281 Muscle weakness (generalized): Secondary | ICD-10-CM | POA: Diagnosis not present

## 2020-06-09 DIAGNOSIS — M5431 Sciatica, right side: Secondary | ICD-10-CM | POA: Diagnosis not present

## 2020-06-09 DIAGNOSIS — C9000 Multiple myeloma not having achieved remission: Secondary | ICD-10-CM | POA: Diagnosis not present

## 2020-06-09 DIAGNOSIS — M79604 Pain in right leg: Secondary | ICD-10-CM | POA: Diagnosis not present

## 2020-06-09 NOTE — Telephone Encounter (Signed)
ERROR

## 2020-06-09 NOTE — Progress Notes (Signed)
Subjective:   Tina Hampton is a 87 y.o. female who presents for Medicare Annual (Subsequent) preventive examination in Assisted Living at [facility].     Objective:    Today's Vitals   06/08/20 1412  BP: 128/60  Pulse: 77  Resp: 20  Temp: (!) 97 F (36.1 C)  SpO2: 96%  Weight: 148 lb (67.1 kg)  Height: 5\' 2"  (1.575 m)   Body mass index is 27.07 kg/m.  Advanced Directives 06/08/2020 04/01/2020 01/03/2020 11/12/2019 08/08/2019 02/25/2019 12/07/2018  Does Patient Have a Medical Advance Directive? No Yes Yes Yes Yes Yes Yes  Type of Advance Directive - Living will;Healthcare Power of [place];Living will;Out of facility DNR (pink MOST or yellow form) [place];Living will Living will;Healthcare Power of [place];Living will  Does patient want to make changes to medical advance directive? - No - Patient declined No - Patient declined No - Patient declined No - Patient declined - No - Patient declined  Copy of [place] in Chart? - Yes - validated most recent copy scanned in chart (See row information) Yes - validated most recent copy scanned in chart (See row information) Yes - validated most recent copy scanned in chart (See row information) Yes - validated most recent copy scanned in chart (See row information) - Yes - validated most recent copy scanned in chart (See row information)  Would patient like information on creating a medical advance directive? No - Patient declined - - - - - -  Pre-existing out of facility DNR order (yellow form or pink MOST form) - Pink MOST form placed in chart (order not valid for inpatient use) Pink MOST form placed in chart (order not valid for inpatient use) - - - -    Current Medications (verified) Outpatient Encounter Medications as of 06/08/2020  Medication Sig  . acetaminophen-codeine (TYLENOL #3) 300-30 MG tablet Take 1 tablet by mouth every  6 (six) hours as needed for moderate pain.  . Acetylcysteine 600 MG CAPS Take 1 capsule by mouth daily.  Marland Kitchen antiseptic oral rinse (BIOTENE) LIQD 15 mLs by Mouth Rinse route every 4 (four) hours as needed for dry mouth. Resident may self administer and keep in room  . aspirin 81 MG chewable tablet Chew 81 mg by mouth daily.  . Bismuth Subsalicylate (PEPTO-BISMOL PO) Take 30 mg by mouth as needed. For diarrhea  . buPROPion (WELLBUTRIN XL) 300 MG 24 hr tablet Take 1 tablet (300 mg total) by mouth daily.  . Calcium Carb-Cholecalciferol 600-800 MG-UNIT TABS Take 1 tablet by mouth 2 (two) times daily.  . chlorpheniramine-HYDROcodone (TUSSIONEX) 10-8 MG/5ML SUER Take 5 mLs by mouth. Give 5 ml q12H PRN cough, Resident may self administer and keep in room  . cholecalciferol (VITAMIN D) 1000 UNITS tablet Take 1,000 Units by mouth daily.  Marland Kitchen docusate sodium (COLACE) 100 MG capsule Take 1 capsule (100 mg total) by mouth daily.  Marland Kitchen econazole nitrate 1 % cream Apply topically. apply once daily until till resolved, may self administer, may keep at bedside  . escitalopram (LEXAPRO) 10 MG tablet Take 10 mg by mouth daily.  . Eyelid Cleansers (OCUSOFT EYELID CLEANSING) PADS Apply 1 application topically daily.  . Glucosamine HCl 1000 MG TABS Take 2,000 mg by mouth 2 (two) times daily. 2 tabs  . LORazepam (ATIVAN) 0.5 MG tablet Take 0.5 mg by mouth 3 (three) times daily as needed for anxiety.  . meloxicam (  MOBIC) 7.5 MG tablet TAKE 1 TABLET BY MOUTH EVERY DAY AS NEEDED FOR ARTHRITIS  . Menthol, Topical Analgesic, 4 % GEL Apply 1 application topically as needed. Resident may self administer and keep in room  . miconazole (MICOTIN) 2 % powder Apply topically as needed for itching. apply to legs and feet once a day  . mirtazapine (REMERON) 15 MG tablet Take 30 mg by mouth at bedtime.   . Multiple Vitamin (MULTIVITAMIN WITH MINERALS) TABS Take 1 tablet by mouth daily.  . Multiple Vitamins-Minerals (PRESERVISION AREDS  2+MULTI VIT PO) Take 1 tablet by mouth 2 (two) times daily.  Marland Kitchen NIFEdipine (PROCARDIA XL/ADALAT-CC) 60 MG 24 hr tablet TAKE 1 TABLET BY MOUTH EVERY DAY TO CONTROL BLOOD PRESSURE  . pantoprazole (PROTONIX) 40 MG tablet One daily to reduce stomach acid  . Polyethyl Glycol-Propyl Glycol (SYSTANE) 0.4-0.3 % GEL ophthalmic gel Place 1 application into both eyes daily as needed. Special Instructions: to both eyes any time resident request; continue scheduled order As Needed  . Polyethyl Glycol-Propyl Glycol (SYSTANE) 0.4-0.3 % GEL ophthalmic gel Place 1 application into both eyes in the morning, at noon, in the evening, and at bedtime.  . polyethylene glycol (MIRALAX / GLYCOLAX) packet Take 17 g by mouth daily.   . pravastatin (PRAVACHOL) 10 MG tablet Take 10 mg by mouth daily.  . [DISCONTINUED] hypromellose (GENTEAL) 0.3 % GEL ophthalmic ointment Place into both eyes 4 (four) times daily.  . [DISCONTINUED] LORazepam (ATIVAN) 0.5 MG tablet Take 1 tablet (0.5 mg total) by mouth 2 (two) times daily.  . [DISCONTINUED] Polyethyl Glycol-Propyl Glycol 0.4-0.3 % SOLN Place 1 drop into both eyes in the morning, at noon, in the evening, and at bedtime.   No facility-administered encounter medications on file as of 06/08/2020.    Allergies (verified) Lamictal [lamotrigine], Penicillins, and Abilify [aripiprazole]   History: Past Medical History:  Diagnosis Date  . Abnormal mammogram, unspecified 04/19/2011  . Allergy   . Anxiety   . Backache, unspecified 01/21/2005  . Cervicalgia 08/20/2013  . Chronic kidney disease, stage II (mild) 04/03/2009  . Chronic rhinitis 09/28/2010  . DDD (degenerative disc disease)   . Depression   . Dermatophytosis of nail 01/21/2005  . Diverticulosis of colon (without mention of hemorrhage) 06/17/2005  . Edema 11/26/2004  . GERD (gastroesophageal reflux disease)   . Hiatal hernia   . High cholesterol   . Hyperglycemia 11/18/2014  . Hypertension   . Insomnia, unspecified 10/18/2011   . Left inguinal hernia 04/24/2014  . Nocturia 09/19/2008  . Osteoporosis, senile 11/18/2014  . Other abnormal blood chemistry 04/03/2007  . Pain in joint, pelvic region and thigh 11/12/2011  . Pain in joint, shoulder region 08/15/2006  . Pain in limb 08/26/2005  . Palpitations 09/29/2009  . PAOD (peripheral arterial occlusive disease) ([place]) 11/18/2014   Right leg; diminished popliteal, dorsalis pedis, and posterior tibial artery pulsations   . Postmenopausal atrophic vaginitis 12/14/2004  . Scoliosis   . Senile osteoporosis 12/14/2004  . Tension headache 10/04/2005  . Unspecified cataract 11/26/2004  . Unspecified constipation 01/21/2005  . Unspecified urinary incontinence 11/26/2004  . Vaginal stenosis 11/18/2014   Tight band about 1/2 inches into the vagina which will not allow passage of my index finger.    Past Surgical History:  Procedure Laterality Date  . ABDOMINAL HYSTERECTOMY  1969   Dr.Braun   . bilateral eyelid surgery  2005   Dr.Scott  . BILATERAL OOPHORECTOMY  2002  . BREAST SURGERY  benign tumor removal, left breast 1969  . CATARACT EXTRACTION     Right  . CHOLECYSTECTOMY  1990   Dr.Moore   . COLONOSCOPY  3/18/20008   inflammation, diverticular associated colitis -Dr. Ardis Hughs  . COSMETIC SURGERY    . ESOPHAGOGASTRODUODENOSCOPY N/A 01/24/2014   Procedure: ESOPHAGOGASTRODUODENOSCOPY (EGD);  Surgeon: Irene Shipper, MD;  Location[facility] ENDOSCOPY;  Service: Endoscopy;  Laterality: N/A;  . ESOPHAGOGASTRODUODENOSCOPY ENDOSCOPY  04/04/14   Dr. Ardis Hughs  . EYE SURGERY    . FOREIGN BODY REMOVAL N/A 01/24/2014   Procedure: FOREIGN BODY REMOVAL;  Surgeon: Irene Shipper, MD;  Location: [place];  Service: Endoscopy;  Laterality: N/A;  . HIP PINNING,CANNULATED  11/07/2011   Procedure: CANNULATED HIP PINNING;  Surgeon: Jessy Oto, MD;  Location: [facility];  Service: Orthopedics;  Laterality: Left;  . ORIF FEMORAL NECK FRACTURE W/ [facility]  11/07/2011   Dr.Nitka   . TONSILLECTOMY     Dr.Joe Tamala Julian   . TUBAL LIGATION    . VAGINAL PROLAPSE REPAIR  2002   Dr.Horback    Family History  Problem Relation Age of Onset  . Heart disease Mother   . Stroke Mother   . Emphysema Father   . Arthritis Father   . Alcohol abuse Brother   . Heart disease Son   . Leukemia Son   . Esophageal cancer Neg Hx   . Stomach cancer Neg Hx   . Colon cancer Neg Hx    Social History   Socioeconomic History  . Marital status: Widowed    Spouse name: Not on file  . Number of children: Not on file  . Years of education: Not on file  . Highest education level: Not on file  Occupational History  . 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 Smoking status: Never Smoker  . Smokeless tobacco: Never Used  Vaping Use  . Vaping Use: Never used  Substance and Sexual Activity  . Alcohol use: Not Currently    Alcohol/week: 0.0 standard drinks    Comment: one glass of wine in evening  . Drug use: No  . Sexual activity: Never  Other Topics Concern  . Not on file  Social History Narrative   L[facility] at [facility] since 04/14/[facility]   Widowed (husband expired 2014)   Has Living Will, POA, MOST   Exercise: water aerobic  5 times a week   Walks with walker   Never smoked   Drinks moderate amount of caffeinate beverages daily   Alcohol none      Social Determinants of Radio broadcast assistant Strain: Not on file  Food Insecurity: Not on file  Transportation Needs: Not on file  Physical Activity: Not on file  Stress: Not on file  Social Connections: Not on file    Tobacco Counseling Counseling given: Not Answered   Clinical Intake:  Pre-visit preparation completed: Yes  Pain : No/denies pain     BMI - recorded: 27.07 Nutritional Status: BMI 25 -29 Overweight Nutritional Risks: None Diabetes: No  How often do you need to have someone help you when you read instructions, pamphlets, or other written materials from your doctor or pharmacy?: 1 -  Never  Diabetic?no  Interpreter Needed?: No      Activities of Daily Living In your present state of health, do you have any difficulty performing the following activities: 06/09/2020  Hearing? Y  Vision? N  Difficulty concentrating or making decisions?  Y  Walking or climbing stairs? Y  Dressing or bathing? Y  Doing errands, shopping? Y  Preparing Food and eating ? N  Using the Toilet? N  In the past six months, have you accidently leaked urine? Y  Do you have problems with loss of bowel control? N  Managing your Medications? N  Managing your Finances? Y  Housekeeping or managing your Housekeeping? Y  Some recent data might be hidden    Patient Care Team: Virgie Dad, MD as PCP - General (Internal Medicine) Melina Modena, [facility] Clent Jacks, MD as Consulting Physician (Ophthalmology) Milus Banister, MD as Consulting Physician (Gastroenterology) Norma Fredrickson, MD as Consulting Physician (Psychiatry) Jessy Oto, MD as Consulting Physician (Orthopedic Surgery) Bjorn Loser, MD as Consulting Physician (Urology)  Indicate any recent Medical Services you may have received from other than [facility] providers in the past year (date may be approximate).     Assessment:   This is a routine wellness examination for [place].  Hearing/Vision screen No exam data present  Dietary issues and exercise activities discussed: Current Exercise Habits: Home exercise routine;Structured exercise class, Type of exercise: strength training/weights;stretching;walking, Time (Minutes): 30, Frequency (Times/Week): 5, Weekly Exercise (Minutes/Week): 150, Intensity: Mild, Exercise limited by: orthopedic condition(s);psychological condition(s)  Goals    . Anxiety Symptoms Monitored and Managed     Evidence-based guidance:   Discuss adherence to medication therapy; assess and manage barriers, such as costs, side effects, feeling little or no improvement, stigma or low motivation.    Discuss past experiences with psychotropic medication, potential side effects and need to continue medication until treatment becomes effective (e.g., 4 to 8 weeks).   Explain the interplay of stress and physical symptoms, as well as the relationship between illness and emotional problems.   Explore complementary and alternative therapy options, such as applied relaxation, mindfulness, meditation, music therapy, aromatherapy, acupuncture or massage.   Prepare patient for antidepressant pharmacologic therapy which may include additional short-term medications until maintenance medications demonstrate therapeutic effect.    Assess response; monitor and manage side effects.   Prepare patient for use of pharmacologic therapy (e.g., anxiolytic, selective serotonin-reuptake inhibitor, serotonin norepinephrine-reuptake inhibitor, tricyclic antidepressant, antiepileptic, antipsychotic for comorbid depression,    phosphodiesterase-inhibitor for sexual dysfunction, sedative or hypnotic for sleep disturbance); monitor and manage side effects.   Prepare patient for long-term pharmacologic therapy to prevent relapse (e.g., 12 months after symptom improvement).   Promote cognitive behavioral therapy, individualized to severity of symptoms [e.g., nonfacilitated self-help, facilitated (computerized or manual-based) self-help, face-to-face individual treatment].   Encourage patient to develop a self-management plan that identifies and manages lifestyle triggers (e.g., caffeine, stimulants, nicotine, stress), improves sleep, exercise or physical activity based on tolerance.   Prepare patient for a referral to a psychiatrist when patient has a poor response to treatment, atypical presentation or comorbid psychiatric disorder.   Provide frequent follow-up (e.g., every 2 weeks for the first 6 to 8 weeks of treatment and monthly thereafter).   Explore means to support work reintegration, such as modified  working hours, peer support or coaching or a community jobs program.   Notes:       Depression Screen PHQ 2/9 Scores 04/25/2018 04/21/2017 05/31/2016 10/20/2015 09/01/2015 08/19/2015 08/15/2015  PHQ - 2 Score 6 6 0 1 0 0 0  PHQ- 9 Score 10 15 - - - - -    Fall Risk Fall Risk  04/09/2019 04/25/2018 04/05/2018 04/21/2017 10/17/2016  Falls in the past year? 0 0 0 No No  Comment Emmi Telephone Survey: data to providers prior to load - Emmi Telephone Survey: data to providers prior to load - -  Number falls in past yr: - 0 - - -  Injury with Fall? - 0 - - -    FALL RISK PREVENTION PERTAINING TO THE HOME:  Any stairs in or around the home? Yes  If so, are there any without handrails? No  Home free of loose throw rugs in walkways, pet beds, electrical cords, etc? Yes  Adequate lighting in your home to reduce risk of falls? Yes   ASSISTIVE DEVICES UTILIZED TO PREVENT FALLS:  Life alert? No  Use of a cane, walker or w/c? Yes  Grab bars in the bathroom? Yes  Shower chair or bench in shower? Yes  Elevated toilet seat or a handicapped toilet? Yes   TIMED UP AND GO:  Was the test performed? Yes .  Length of time to ambulate 10 feet: 8 sec.   Gait slow and steady with assistive device  Cognitive Function: MMSE - Mini Mental State Exam 09/20/2017 05/31/2016 11/18/2014  Orientation to time 5 5 5   Orientation to Place 5 5 5   Registration 3 3 3   Attention/ Calculation 5 5 5   Recall 3 3 3   Language- name 2 objects 2 2 2   Language- repeat 1 1 1   Language- follow 3 step command 3 3 3   Language- read & follow direction 1 1 1   Write a sentence 1 1 1   Copy design - 1 1  Total score - 30 30        Immunizations Immunization History  Administered Date(s) Administered  . Influenza Whole 02/14/2012, 02/14/2013  . Influenza, High Dose Seasonal PF 03/14/2017, 02/26/2019  . Influenza-Unspecified 02/27/2014, 02/12/2015, 02/25/2016, 03/15/2017, 02/19/2018, 02/18/2020  . Moderna Sars-Covid-2 Vaccination  05/20/2019, 06/17/2019  . Pneumococcal Conjugate-13 07/13/2015  . Pneumococcal Polysaccharide-23 05/16/1998  . Td 05/16/2004  . Zoster 05/16/2005  . Zoster Recombinat (Shingrix) 02/20/2018, 05/11/2018, 07/16/2018    TDAP status: Up to date  Flu Vaccine status: Up to date  Pneumococcal vaccine status: Up to date  Covid-19 vaccine status: Completed vaccines  Qualifies for Shingles Vaccine? Yes   Zostavax completed Yes   Shingrix Completed?: Yes  Screening Tests Health Maintenance  Topic Date Due  . URINE MICROALBUMIN  Never done  . TETANUS/TDAP  05/16/2014  . MAMMOGRAM  12/20/2017  . COVID-19 Vaccine (3 - Booster for Moderna series) 12/15/2019  . INFLUENZA VACCINE  Completed  . DEXA SCAN  Completed  . PNA vac Low Risk Adult  Completed    Health Maintenance  Health Maintenance Due  Topic Date Due  . URINE MICROALBUMIN  Never done  . TETANUS/TDAP  05/16/2014  . MAMMOGRAM  12/20/2017  . COVID-19 Vaccine (3 - Booster for Moderna series) 12/15/2019    Colorectal cancer screening: No longer required.   Mammogram status: No longer required due to age.  Bone Density status: Completed 2016. Results reflect: Bone density results: OSTEOPOROSIS. Repeat every prn years.  Lung Cancer Screening: (Low Dose CT Chest recommended if Age 38-80 years, 30 pack-year currently smoking OR have quit w/in 15years.) does not qualify.   Lung Cancer Screening Referral: no  Additional Screening:  Hepatitis C Screening: does not qualify  Vision Screening: Recommended annual ophthalmology exams for early detection of glaucoma and other disorders of the eye. Is the patient up to date with their annual eye exam?  Yes  Who is the provider or what is the name  of the office in which the patient attends annual eye exams? Dr. Carolynn Sayers If pt is not established with a provider, would they like to be referred to a provider to establish care? No .   Dental Screening: Recommended annual dental exams for  proper oral hygiene  Community Resource Referral / Chronic Care Management: CRR required this visit?  No   CCM required this visit?  No      Plan:     I have personally reviewed and noted the following in the patient's chart:   . Medical and social history . Use of alcohol, tobacco or illicit drugs  . Current medications and supplements . Functional ability and status . Nutritional status . Physical activity . Advanced directives . List of other physicians . Hospitalizations, surgeries, and ER visits in previous 12 months . Vitals . Screenings to include cognitive, depression, and falls . Referrals and appointments  In addition, I have reviewed and discussed with patient certain preventive protocols, quality metrics, and best practice recommendations. A written personalized care plan for preventive services as well as general preventive health recommendations were provided to patient.     Mamoru Takeshita X Kamarius Buckbee, NP   06/09/2020

## 2020-06-10 ENCOUNTER — Telehealth: Payer: Self-pay | Admitting: Adult Health

## 2020-06-10 NOTE — Telephone Encounter (Signed)
Can you verify the dose she is taking?

## 2020-06-10 NOTE — Telephone Encounter (Signed)
Pt called and said that she is not doing well. Her therapist thinks that she should increase her lexapro to see if that will help. Please give her a call at [phone].

## 2020-06-11 ENCOUNTER — Other Ambulatory Visit: Payer: Self-pay | Admitting: Adult Health

## 2020-06-11 ENCOUNTER — Other Ambulatory Visit: Payer: Self-pay | Admitting: *Deleted

## 2020-06-11 DIAGNOSIS — F411 Generalized anxiety disorder: Secondary | ICD-10-CM

## 2020-06-11 MED ORDER — ESCITALOPRAM OXALATE 5 MG PO TABS: 5.0000 mg | ORAL_TABLET | Freq: Every day | ORAL | 0 refills | Status: DC

## 2020-06-11 MED ORDER — ESCITALOPRAM OXALATE 10 MG PO TABS: 10.0000 mg | ORAL_TABLET | Freq: Every day | ORAL | 1 refills | Status: DC

## 2020-06-11 MED ORDER — LORAZEPAM 0.5 MG PO TABS: 0.5000 mg | ORAL_TABLET | Freq: Three times a day (TID) | ORAL | 0 refills | Status: DC | PRN

## 2020-06-11 NOTE — Telephone Encounter (Signed)
Received refill Request from [facility] and sent to Dr. Lyndel Safe for approval.

## 2020-06-14 DIAGNOSIS — C9000 Multiple myeloma not having achieved remission: Secondary | ICD-10-CM | POA: Diagnosis not present

## 2020-06-14 DIAGNOSIS — M5431 Sciatica, right side: Secondary | ICD-10-CM | POA: Diagnosis not present

## 2020-06-14 DIAGNOSIS — R2689 Other abnormalities of gait and mobility: Secondary | ICD-10-CM | POA: Diagnosis not present

## 2020-06-14 DIAGNOSIS — M6281 Muscle weakness (generalized): Secondary | ICD-10-CM | POA: Diagnosis not present

## 2020-06-14 DIAGNOSIS — R2681 Unsteadiness on feet: Secondary | ICD-10-CM | POA: Diagnosis not present

## 2020-06-14 DIAGNOSIS — M79604 Pain in right leg: Secondary | ICD-10-CM | POA: Diagnosis not present

## 2020-06-22 ENCOUNTER — Encounter: Payer: Self-pay | Admitting: Nurse Practitioner

## 2020-06-22 ENCOUNTER — Non-Acute Institutional Stay: Payer: Medicare Other | Admitting: Nurse Practitioner

## 2020-06-22 DIAGNOSIS — R42 Dizziness and giddiness: Secondary | ICD-10-CM | POA: Diagnosis not present

## 2020-06-22 DIAGNOSIS — M48062 Spinal stenosis, lumbar region with neurogenic claudication: Secondary | ICD-10-CM | POA: Diagnosis not present

## 2020-06-22 DIAGNOSIS — K219 Gastro-esophageal reflux disease without esophagitis: Secondary | ICD-10-CM

## 2020-06-22 DIAGNOSIS — F339 Major depressive disorder, recurrent, unspecified: Secondary | ICD-10-CM

## 2020-06-22 DIAGNOSIS — F411 Generalized anxiety disorder: Secondary | ICD-10-CM | POA: Diagnosis not present

## 2020-06-22 DIAGNOSIS — K5900 Constipation, unspecified: Secondary | ICD-10-CM | POA: Diagnosis not present

## 2020-06-22 DIAGNOSIS — I10 Essential (primary) hypertension: Secondary | ICD-10-CM | POA: Diagnosis not present

## 2020-06-22 NOTE — Assessment & Plan Note (Signed)
on and off, chronic, ? Anxiety, MRI brain-Patient declined it.

## 2020-06-22 NOTE — Assessment & Plan Note (Signed)
see Psych, Lorazepam, TSH 2.0u 11/14/19

## 2020-06-22 NOTE — Progress Notes (Signed)
Location:   [place] Room Number: 8 Place of Service:  ALF (13) Provider: Lennie Odor Raevyn Sokol NP  Virgie Dad, MD  Patient Care Team: Virgie Dad, MD as PCP - General (Internal Medicine) Melina Modena, [facility] Clent Jacks, MD as Consulting Physician (Ophthalmology) Milus Banister, MD as Consulting Physician (Gastroenterology) Norma Fredrickson, MD as Consulting Physician (Psychiatry) Jessy Oto, MD as Consulting Physician (Orthopedic Surgery) Bjorn Loser, MD as Consulting Physician (Urology)  Extended Emergency Contact Information Primary Emergency Contact: [place], [place] [address] Johnnette Litter of [place] Phone: [phone] Mobile Phone: [phone] Relation: Son  Code Status:  DNR Goals of care: Advanced Directive information Advanced Directives 06/08/2020  Does Patient Have a Medical Advance Directive? No  Type of Advance Directive -  Does patient want to make changes to medical advance directive? -  Copy of [place] in Chart? -  Would patient like information on creating a medical advance directive? No - Patient declined  Pre-existing out of facility DNR order (yellow form or pink MOST form) -     Chief Complaint  Patient presents with  . Medical Management of Chronic Issues  . Health Maintenance    TDAP    HPI:  Pt is a 87 y.o. female seen today for medical management of chronic diseases.    GERD, stable, on Pantoprazole 40mg  qd.  Constipation, stable, on Colace qd, MiraLax qd.   Lower back pian, uses prn Tramadol, Tylenol,  ambulates with walker.  HTN, blood pressure is controlled, on Nifedipine 60mg  qd, ASA 81mg  qd, Bun/creat 23/0.8 03/09/20  Anxiety see Psych, Lorazepam, TSH 2.0u 11/14/19  Depression: takes Wellbutrin, Mirtazapine, Lexapro.   Dizziness, on and off, chronic, ? Anxiety, MRI brain-Patient declined it.     Past Medical History:  Diagnosis Date  .  Abnormal mammogram, unspecified 04/19/2011  . Allergy   . Anxiety   . Backache, unspecified 01/21/2005  . Cervicalgia 08/20/2013  . Chronic kidney disease, stage II (mild) 04/03/2009  . Chronic rhinitis 09/28/2010  . DDD (degenerative disc disease)   . Depression   . Dermatophytosis of nail 01/21/2005  . Diverticulosis of colon (without mention of hemorrhage) 06/17/2005  . Edema 11/26/2004  . GERD (gastroesophageal reflux disease)   . Hiatal hernia   . High cholesterol   . Hyperglycemia 11/18/2014  . Hypertension   . Insomnia, unspecified 10/18/2011  . Left inguinal hernia 04/24/2014  . Nocturia 09/19/2008  . Osteoporosis, senile 11/18/2014  . Other abnormal blood chemistry 04/03/2007  . Pain in joint, pelvic region and thigh 11/12/2011  . Pain in joint, shoulder region 08/15/2006  . Pain in limb 08/26/2005  . Palpitations 09/29/2009  . PAOD (peripheral arterial occlusive disease) ([place]) 11/18/2014   Right leg; diminished popliteal, dorsalis pedis, and posterior tibial artery pulsations   . Postmenopausal atrophic vaginitis 12/14/2004  . Scoliosis   . Senile osteoporosis 12/14/2004  . Tension headache 10/04/2005  . Unspecified cataract 11/26/2004  . Unspecified constipation 01/21/2005  . Unspecified urinary incontinence 11/26/2004  . Vaginal stenosis 11/18/2014   Tight band about 1/2 inches into the vagina which will not allow passage of my index finger.    Past Surgical History:  Procedure Laterality Date  . ABDOMINAL HYSTERECTOMY  1969   Dr.Braun   . bilateral eyelid surgery  2005   Dr.Scott  . BILATERAL OOPHORECTOMY  2002  . BREAST SURGERY     benign tumor removal,  left breast 1969  . CATARACT EXTRACTION     Right  . CHOLECYSTECTOMY  1990   Dr.Moore   . COLONOSCOPY  3/18/20008   inflammation, diverticular associated colitis -Dr. Ardis Hughs  . COSMETIC SURGERY    . ESOPHAGOGASTRODUODENOSCOPY N/A 01/24/2014   Procedure: ESOPHAGOGASTRODUODENOSCOPY (EGD);  Surgeon: Irene Shipper, MD;  Location: [facility]  ENDOSCOPY;  Service: Endoscopy;  Laterality: N/A;  . ESOPHAGOGASTRODUODENOSCOPY ENDOSCOPY  04/04/14   Dr. Ardis Hughs  . EYE SURGERY    . FOREIGN BODY REMOVAL N/A 01/24/2014   Procedure: FOREIGN BODY REMOVAL;  Surgeon: Irene Shipper, MD;  Location: [place];  Service: Endoscopy;  Laterality: N/A;  . HIP PINNING,CANNULATED  11/07/2011   Procedure: CANNULATED HIP PINNING;  Surgeon: Jessy Oto, MD;  Location: [facility];  Service: Orthopedics;  Laterality: Left;  . ORIF FEMORAL NECK FRACTURE W/ [facility]  11/07/2011   Dr.Nitka   . TONSILLECTOMY     Dr.Joe Tamala Julian  . TUBAL LIGATION    . VAGINAL PROLAPSE REPAIR  2002   Dr.Horback     Allergies  Allergen Reactions  . Lamictal [Lamotrigine] Swelling    Lip swelling  . Penicillins Other (See Comments)    CHILDHOOD REACTION  . Abilify [Aripiprazole] Rash    Allergies as of 06/22/2020      Reactions   Lamictal [lamotrigine] Swelling   Lip swelling   Penicillins Other (See Comments)   CHILDHOOD REACTION   Abilify [aripiprazole] Rash      Medication List       Accurate as of June 22, 2020 11:59 PM. If you have any questions, ask your nurse or doctor.        acetaminophen-codeine 300-30 MG tablet Commonly known as: TYLENOL #3 Take 1 tablet by mouth every 6 (six) hours as needed for moderate pain.   Acetylcysteine 600 MG Caps Take 1 capsule by mouth daily.   antiseptic oral rinse Liqd 15 mLs by Mouth Rinse route every 4 (four) hours as needed for dry mouth. Resident may self administer and keep in room   aspirin 81 MG chewable tablet Chew 81 mg by mouth daily.   buPROPion 300 MG 24 hr tablet Commonly known as: WELLBUTRIN XL Take 1 tablet (300 mg total) by mouth daily.   Calcium Carb-Cholecalciferol 600-800 MG-UNIT Tabs Take 1 tablet by mouth 2 (two) times daily.   chlorpheniramine-HYDROcodone 10-8 MG/5ML Suer Commonly known as: TUSSIONEX Take 5 mLs by mouth. Give 5 ml q12H PRN cough, Resident may self administer and keep in  room   cholecalciferol 1000 units tablet Commonly known as: VITAMIN D Take 1,000 Units by mouth daily.   docusate sodium 100 MG capsule Commonly known as: Colace Take 1 capsule (100 mg total) by mouth daily.   econazole nitrate 1 % cream Apply topically. apply once daily until till resolved, may self administer, may keep at bedside   escitalopram 10 MG tablet Commonly known as: LEXAPRO Take 1 tablet (10 mg total) by mouth daily. What changed: Another medication with the same name was removed. Continue taking this medication, and follow the directions you see here. Changed by: Malasia Torain X [place] Rios, NP   Glucosamine HCl 1000 MG Tabs Take 2,000 mg by mouth 2 (two) times daily. 2 tabs   LORazepam 0.5 MG tablet Commonly known as: ATIVAN Take 1 tablet (0.5 mg total) by mouth 3 (three) times daily as needed for anxiety.   meloxicam 7.5 MG tablet Commonly known as: MOBIC TAKE 1 TABLET BY MOUTH EVERY DAY AS  NEEDED FOR ARTHRITIS   Menthol (Topical Analgesic) 4 % Gel Apply 1 application topically as needed. Resident may self administer and keep in room   miconazole 2 % powder Commonly known as: MICOTIN Apply topically as needed for itching. apply to legs and feet once a day   mirtazapine 15 MG tablet Commonly known as: REMERON Take 30 mg by mouth at bedtime.   multivitamin with minerals Tabs tablet Take 1 tablet by mouth daily.   NIFEdipine 60 MG 24 hr tablet Commonly known as: PROCARDIA XL/NIFEDICAL XL TAKE 1 TABLET BY MOUTH EVERY DAY TO CONTROL BLOOD PRESSURE   OcuSoft Eyelid Cleansing Pads Apply 1 application topically daily.   pantoprazole 40 MG tablet Commonly known as: PROTONIX One daily to reduce stomach acid   PEPTO-BISMOL PO Take 30 mg by mouth as needed. For diarrhea   polyethylene glycol 17 g packet Commonly known as: MIRALAX / GLYCOLAX Take 17 g by mouth daily.   pravastatin 10 MG tablet Commonly known as: PRAVACHOL Take 10 mg by mouth daily.   PRESERVISION  AREDS 2+MULTI VIT PO Take 1 tablet by mouth 2 (two) times daily.   Systane 0.4-0.3 % Gel ophthalmic gel Generic drug: Polyethyl Glycol-Propyl Glycol Place 1 application into both eyes daily as needed. Special Instructions: to both eyes any time resident request; continue scheduled order As Needed   Systane 0.4-0.3 % Gel ophthalmic gel Generic drug: Polyethyl Glycol-Propyl Glycol Place 1 application into both eyes in the morning, at noon, in the evening, and at bedtime.       Review of Systems  Constitutional: Negative for fatigue, fever and unexpected weight change.  HENT: Positive for hearing loss. Negative for congestion and voice change.   Eyes: Negative for visual disturbance.  Respiratory: Negative for cough and shortness of breath.   Cardiovascular: Positive for leg swelling. Negative for chest pain and palpitations.  Gastrointestinal: Negative for abdominal pain, constipation, nausea and vomiting.  Genitourinary: Negative for difficulty urinating, dysuria and urgency.       Incontinent of urine  Musculoskeletal: Positive for arthralgias, back pain and gait problem.  Skin: Negative for color change and pallor.  Neurological: Positive for tremors and numbness. Negative for speech difficulty, weakness, light-headedness and headaches.       Right finger resting tremor mild, left fingers feels numb  Psychiatric/Behavioral: Positive for dysphoric mood. Negative for behavioral problems, hallucinations and sleep disturbance. The patient is nervous/anxious.        Worries, fears    Immunization History  Administered Date(s) Administered  . Influenza Whole 02/14/2012, 02/14/2013  . Influenza, High Dose Seasonal PF 03/14/2017, 02/26/2019  . Influenza-Unspecified 02/27/2014, 02/12/2015, 02/25/2016, 03/15/2017, 02/19/2018, 02/18/2020  . Moderna Sars-Covid-2 Vaccination 05/20/2019, 06/17/2019, 03/30/2020  . Pneumococcal Conjugate-13 07/13/2015  . Pneumococcal Polysaccharide-23  05/16/1998  . Td 05/16/2004  . Zoster 05/16/2005  . Zoster Recombinat (Shingrix) 02/20/2018, 05/11/2018, 07/16/2018   Pertinent  Health Maintenance Due  Topic Date Due  . INFLUENZA VACCINE  Completed  . DEXA SCAN  Completed  . PNA vac Low Risk Adult  Completed  . MAMMOGRAM  Discontinued   Fall Risk  04/09/2019 04/25/2018 04/05/2018 04/21/2017 10/17/2016  Falls in the past year? 0 0 0 No No  Comment Emmi Telephone Survey: data to providers prior to load - Emmi Telephone Survey: data to providers prior to load - -  Number falls in past yr: - 0 - - -  Injury with Fall? - 0 - - -   Functional Status Survey:  Vitals:   06/22/20 1133  BP: 134/69  Pulse: 84  Temp: (!) 97 F (36.1 C)  Weight: 146 lb 12.8 oz (66.6 kg)  Height: 5\' 2"  (1.575 m)   Body mass index is 26.85 kg/m. Physical Exam Vitals and nursing note reviewed.  Constitutional:      Appearance: Normal appearance.     Comments: Over weight  HENT:     Head: Normocephalic and atraumatic.     Mouth/Throat:     Mouth: Mucous membranes are moist.  Eyes:     Extraocular Movements: Extraocular movements intact.     Conjunctiva/sclera: Conjunctivae normal.     Pupils: Pupils are equal, round, and reactive to light.  Cardiovascular:     Rate and Rhythm: Normal rate and regular rhythm.     Heart sounds: Murmur heard.    Pulmonary:     Breath sounds: No rales.  Abdominal:     General: Bowel sounds are normal.     Palpations: Abdomen is soft.     Tenderness: There is no abdominal tenderness.  Musculoskeletal:     Cervical back: Normal range of motion and neck supple.     Right lower leg: Edema present.     Left lower leg: Edema present.     Comments: Trace edema BLE  Skin:    General: Skin is warm and dry.  Neurological:     General: No focal deficit present.     Mental Status: She is alert and oriented to person, place, and time. Mental status is at baseline.     Gait: Gait abnormal.  Psychiatric:      Comments: Anxiety, depression, feel not loved.      Labs reviewed: Recent Labs    11/14/19 0000 03/09/20 0000  NA 139 139  K 4.1 4.3  CL 103 103  CO2 29* 27*  BUN 16 23*  CREATININE 0.9 0.8  CALCIUM 10.0 10.0   Recent Labs    11/14/19 0000 03/09/20 0000  AST 16 16  ALT 14 12  ALKPHOS 57 64  ALBUMIN 3.9 4.1   Recent Labs    11/14/19 0000 03/09/20 0000  WBC 5.3 6.7  NEUTROABS 3,625  --   HGB 2.1* 12.8  HCT 36 39  PLT 211 240   Lab Results  Component Value Date   TSH 2.07 11/14/2019   Lab Results  Component Value Date   HGBA1C 5.5 09/28/2018   Lab Results  Component Value Date   CHOL 174 12/23/2019   HDL 58 12/23/2019   LDLCALC 91 12/23/2019   TRIG 99 11/08/2018   CHOLHDL 3.5 08/04/2017    Significant Diagnostic Results in last 30 days:  No results found.  Assessment/Plan GERD (gastroesophageal reflux disease) stable, on Pantoprazole 40mg  qd.    Constipation stable, on Colace qd, MiraLax qd.    Spinal stenosis of lumbar region with neurogenic claudication  uses prn Tramadol, Tylenol,  ambulates with walker.    Hypertension blood pressure is elevated 150/88 mmHg when myself checked, the patient denied dizziness, HA, chest pain/pressure, or palpitation, the patient stated her blood pressure is affected by her anxiety, on Nifedipine 60mg  qd, ASA 81mg  qd, Bun/creat 23/0.8 03/09/20. Will monitor Bp q shift x 5 days.    GAD (generalized anxiety disorder) see Psych, Lorazepam, TSH 2.0u 11/14/19   Major depression, recurrent, chronic (HCC) takes Wellbutrin, Mirtazapine, Lexapro.   Dizziness on and off, chronic, ? Anxiety, MRI brain-Patient declined it.      Family/ staff Communication:  plan of care reviewed with the patient and charge nurse.   Labs/tests ordered:  none  Time spend 40 minutes.

## 2020-06-22 NOTE — Assessment & Plan Note (Signed)
stable, on Colace qd, MiraLax qd.

## 2020-06-22 NOTE — Assessment & Plan Note (Addendum)
blood pressure is elevated 150/88 mmHg when myself checked, the patient denied dizziness, HA, chest pain/pressure, or palpitation, the patient stated her blood pressure is affected by her anxiety, on Nifedipine 60mg  qd, ASA 81mg  qd, Bun/creat 23/0.8 03/09/20. Will monitor Bp q shift x 5 days.

## 2020-06-22 NOTE — Assessment & Plan Note (Signed)
takes Wellbutrin, Mirtazapine, Lexapro.

## 2020-06-22 NOTE — Assessment & Plan Note (Signed)
uses prn Tramadol, Tylenol,  ambulates with walker.

## 2020-06-22 NOTE — Assessment & Plan Note (Signed)
stable, on Pantoprazole 40mg  qd.

## 2020-06-23 ENCOUNTER — Encounter: Payer: Self-pay | Admitting: Nurse Practitioner

## 2020-06-24 DIAGNOSIS — F411 Generalized anxiety disorder: Secondary | ICD-10-CM | POA: Diagnosis not present

## 2020-07-03 ENCOUNTER — Encounter: Payer: Self-pay | Admitting: Adult Health

## 2020-07-03 ENCOUNTER — Telehealth (INDEPENDENT_AMBULATORY_CARE_PROVIDER_SITE_OTHER): Payer: Medicare Other | Admitting: Adult Health

## 2020-07-03 DIAGNOSIS — F411 Generalized anxiety disorder: Secondary | ICD-10-CM

## 2020-07-03 DIAGNOSIS — F429 Obsessive-compulsive disorder, unspecified: Secondary | ICD-10-CM | POA: Diagnosis not present

## 2020-07-03 DIAGNOSIS — G4700 Insomnia, unspecified: Secondary | ICD-10-CM

## 2020-07-03 DIAGNOSIS — F331 Major depressive disorder, recurrent, moderate: Secondary | ICD-10-CM

## 2020-07-03 MED ORDER — ESCITALOPRAM OXALATE 20 MG PO TABS
20.0000 mg | ORAL_TABLET | Freq: Every day | ORAL | 5 refills | Status: AC
Start: 2020-07-03 — End: ?

## 2020-07-03 NOTE — Progress Notes (Signed)
Tina Hampton [id number] Mar 28, 1926 87 y.o.  Virtual Visit via Telephone Note  I connected with pt on 07/03/20 at  1:20 PM EST by telephone and verified that I am speaking with the correct person using two identifiers.   I discussed the limitations, risks, security and privacy concerns of performing an evaluation and management service by telephone and the availability of in person appointments. I also discussed with the patient that there may be a patient responsible charge related to this service. The patient expressed understanding and agreed to proceed.   I discussed the assessment and treatment plan with the patient. The patient was provided an opportunity to ask questions and all were answered. The patient agreed with the plan and demonstrated an understanding of the instructions.   The patient was advised to call back or seek an in-person evaluation if the symptoms worsen or if the condition fails to improve as anticipated.  I provided 30 minutes of non-face-to-face time during this encounter.  The patient was located at home.  The provider was located at [place].   Aloha Gell, NP   Subjective:   Patient ID:  Tina Hampton is a 87 y.o. (DOB 02-03-1926) female.  Chief Complaint: No chief complaint on file.   HPI Jaylianna Tatlock presents for follow-up of OCD, anxiety, depression, and insomnia.  Describes mood today as "jittery". Pleasant. Mood symptoms - reports depression, anxiety, and irritability. More anxious overall. States "I'm feeling really nervous". Has increased Lexapro from 10mg  to 15mg  x 3 weeks and has felt a minimal "difference". Stating "I still nervous". Visited family over the holidays. Talking with therapist regularly - [facility]. Taking medications as prescribed.  Energy levels varies - "feels tired". Active, exercises daily.  Enjoys some usual interests and activities. Widowed. Lives at [facility]. Talking with family and  friends. Getting out of room daily. Appetite adequate. Weight stable. Sleeps well most nights. Averages 8 hours. Waking up during the night some nights.  Focus and concentration stable. Managing own medications.Completing tasks.Taking medications independently. Denies SI or HI. Denies AH or VH.  Multiple medication trials and failures: Latuda, Abilify, Rexulti, Effexor, Prozac, Buspar, Lamictal, Lexapro, Trintellix, Lorazepam, Luvox, Lorazepam.   Review of Systems:  Review of Systems  Musculoskeletal: Negative for gait problem.  Neurological: Negative for tremors.  Psychiatric/Behavioral:       Please refer to HPI    Medications: I have reviewed the patient's current medications.  Current Outpatient Medications  Medication Sig Dispense Refill  . acetaminophen-codeine (TYLENOL #3) 300-30 MG tablet Take 1 tablet by mouth every 6 (six) hours as needed for moderate pain.    . Acetylcysteine 600 MG CAPS Take 1 capsule by mouth daily.    Marland Kitchen antiseptic oral rinse (BIOTENE) LIQD 15 mLs by Mouth Rinse route every 4 (four) hours as needed for dry mouth. Resident may self administer and keep in room    . aspirin 81 MG chewable tablet Chew 81 mg by mouth daily.    . Bismuth Subsalicylate (PEPTO-BISMOL PO) Take 30 mg by mouth as needed. For diarrhea    . buPROPion (WELLBUTRIN XL) 300 MG 24 hr tablet Take 1 tablet (300 mg total) by mouth daily. 30 tablet 5  . Calcium Carb-Cholecalciferol 600-800 MG-UNIT TABS Take 1 tablet by mouth 2 (two) times daily.    . chlorpheniramine-HYDROcodone (TUSSIONEX) 10-8 MG/5ML SUER Take 5 mLs by mouth. Give 5 ml q12H PRN cough, Resident may self administer and keep in room    . cholecalciferol (  VITAMIN D) 1000 UNITS tablet Take 1,000 Units by mouth daily.    Marland Kitchen docusate sodium (COLACE) 100 MG capsule Take 1 capsule (100 mg total) by mouth daily. 30 capsule 0  . econazole nitrate 1 % cream Apply topically. apply once daily until till resolved, may self administer, may  keep at bedside    . escitalopram (LEXAPRO) 10 MG tablet Take 1 tablet (10 mg total) by mouth daily. 90 tablet 1  . Eyelid Cleansers (OCUSOFT EYELID CLEANSING) PADS Apply 1 application topically daily.    . Glucosamine HCl 1000 MG TABS Take 2,000 mg by mouth 2 (two) times daily. 2 tabs    . LORazepam (ATIVAN) 0.5 MG tablet Take 1 tablet (0.5 mg total) by mouth 3 (three) times daily as needed for anxiety. 90 tablet 0  . meloxicam (MOBIC) 7.5 MG tablet TAKE 1 TABLET BY MOUTH EVERY DAY AS NEEDED FOR ARTHRITIS 90 tablet 1  . Menthol, Topical Analgesic, 4 % GEL Apply 1 application topically as needed. Resident may self administer and keep in room    . miconazole (MICOTIN) 2 % powder Apply topically as needed for itching. apply to legs and feet once a day    . mirtazapine (REMERON) 15 MG tablet Take 30 mg by mouth at bedtime.     . Multiple Vitamin (MULTIVITAMIN WITH MINERALS) TABS Take 1 tablet by mouth daily.    . Multiple Vitamins-Minerals (PRESERVISION AREDS 2+MULTI VIT PO) Take 1 tablet by mouth 2 (two) times daily.    Marland Kitchen NIFEdipine (PROCARDIA XL/ADALAT-CC) 60 MG 24 hr tablet TAKE 1 TABLET BY MOUTH EVERY DAY TO CONTROL BLOOD PRESSURE 90 tablet 3  . pantoprazole (PROTONIX) 40 MG tablet One daily to reduce stomach acid 90 tablet 3  . Polyethyl Glycol-Propyl Glycol (SYSTANE) 0.4-0.3 % GEL ophthalmic gel Place 1 application into both eyes daily as needed. Special Instructions: to both eyes any time resident request; continue scheduled order As Needed    . Polyethyl Glycol-Propyl Glycol (SYSTANE) 0.4-0.3 % GEL ophthalmic gel Place 1 application into both eyes in the morning, at noon, in the evening, and at bedtime.    . polyethylene glycol (MIRALAX / GLYCOLAX) packet Take 17 g by mouth daily.     . pravastatin (PRAVACHOL) 10 MG tablet Take 10 mg by mouth daily.     No current facility-administered medications for this visit.    Medication Side Effects: None  Allergies:  Allergies  Allergen  Reactions  . Lamictal [Lamotrigine] Swelling    Lip swelling  . Penicillins Other (See Comments)    CHILDHOOD REACTION  . Abilify [Aripiprazole] Rash    Past Medical History:  Diagnosis Date  . Abnormal mammogram, unspecified 04/19/2011  . Allergy   . Anxiety   . Backache, unspecified 01/21/2005  . Cervicalgia 08/20/2013  . Chronic kidney disease, stage II (mild) 04/03/2009  . Chronic rhinitis 09/28/2010  . DDD (degenerative disc disease)   . Depression   . Dermatophytosis of nail 01/21/2005  . Diverticulosis of colon (without mention of hemorrhage) 06/17/2005  . Edema 11/26/2004  . GERD (gastroesophageal reflux disease)   . Hiatal hernia   . High cholesterol   . Hyperglycemia 11/18/2014  . Hypertension   . Insomnia, unspecified 10/18/2011  . Left inguinal hernia 04/24/2014  . Nocturia 09/19/2008  . Osteoporosis, senile 11/18/2014  . Other abnormal blood chemistry 04/03/2007  . Pain in joint, pelvic region and thigh 11/12/2011  . Pain in joint, shoulder region 08/15/2006  . Pain in limb 08/26/2005  .  Palpitations 09/29/2009  . PAOD (peripheral arterial occlusive disease) ([place]) 11/18/2014   Right leg; diminished popliteal, dorsalis pedis, and posterior tibial artery pulsations   . Postmenopausal atrophic vaginitis 12/14/2004  . Scoliosis   . Senile osteoporosis 12/14/2004  . Tension headache 10/04/2005  . Unspecified cataract 11/26/2004  . Unspecified constipation 01/21/2005  . Unspecified urinary incontinence 11/26/2004  . Vaginal stenosis 11/18/2014   Tight band about 1/2 inches into the vagina which will not allow passage of my index finger.     Family History  Problem Relation Age of Onset  . Heart disease Mother   . Stroke Mother   . Emphysema Father   . Arthritis Father   . Alcohol abuse Brother   . Heart disease Son   . Leukemia Son   . Esophageal cancer Neg Hx   . Stomach cancer Neg Hx   . Colon cancer Neg Hx     Social History   Socioeconomic History  . Marital status: Widowed     Spouse name: Not on file  . Number of children: Not on file  . Years of education: Not on file  . Highest education level: Not on file  Occupational History  . 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 Smoking status: Never Smoker  . Smokeless tobacco: Never Used  Vaping Use  . Vaping Use: Never used  Substance and Sexual Activity  . Alcohol use: Not Currently    Alcohol/week: 0.0 standard drinks    Comment: one glass of wine in evening  . Drug use: No  . Sexual activity: Never  Other Topics Concern  . Not on file  Social History Narrative   Lives at [facility] since 04/14/2005   Widowed (husband expired 2014)   Has Living Will, POA, MOST   Exercise: water aerobic  5 times a week   Walks with walker   Never smoked   Drinks moderate amount of caffeinate beverages daily   Alcohol none      Social Determinants of Radio broadcast assistant Strain: Not on file  Food Insecurity: Not on file  Transportation Needs: Not on file  Physical Activity: Not on file  Stress: Not on file  Social Connections: Not on file  Intimate Partner Violence: Not on file    Past Medical History, Surgical history, Social history, and Family history were reviewed and updated as appropriate.   Please see review of systems for further details on the patient's review from today.   Objective:   Physical Exam:  There were no vitals taken for this visit.  Physical Exam Constitutional:      General: She is not in acute distress. Musculoskeletal:        General: No deformity.  Neurological:     Mental Status: She is alert and oriented to person, place, and time.     Coordination: Coordination normal.  Psychiatric:        Attention and Perception: Attention and perception normal. She does not perceive auditory or visual hallucinations.        Mood and Affect: Mood normal. Mood is not anxious or depressed. Affect is not labile, blunt, angry or inappropriate.         Speech: Speech normal.        Behavior: Behavior normal.        Thought Content: Thought content normal. Thought content is not paranoid or delusional. Thought content does not include homicidal or suicidal  ideation. Thought content does not include homicidal or suicidal plan.        Cognition and Memory: Cognition and memory normal.        Judgment: Judgment normal.     Comments: Insight intact     Lab Review:     Component Value Date/Time   NA 139 03/09/2020 0000   NA 143 07/10/2017 0000   K 4.3 03/09/2020 0000   K 4.2 07/10/2017 0000   CL 103 03/09/2020 0000   CL 109 07/10/2017 0000   CO2 27 (A) 03/09/2020 0000   CO2 26 07/10/2017 0000   GLUCOSE 86 01/18/2018 0831   BUN 23 (A) 03/09/2020 0000   CREATININE 0.8 03/09/2020 0000   CREATININE 0.93 (H) 01/18/2018 0831   CALCIUM 10.0 03/09/2020 0000   CALCIUM 9.3 07/10/2017 0000   CALCIUM 9.3 07/10/2017 0000   PROT 6.5 01/18/2018 0831   PROT 5.8 07/10/2017 0000   PROT 5.8 07/10/2017 0000   ALBUMIN 4.1 03/09/2020 0000   ALBUMIN 3.5 07/10/2017 0000   ALBUMIN 3.5 07/10/2017 0000   AST 16 03/09/2020 0000   AST 18 07/10/2017 0000   ALT 12 03/09/2020 0000   ALT 13 07/10/2017 0000   ALKPHOS 64 03/09/2020 0000   ALKPHOS 60 07/10/2017 0000   BILITOT 0.3 01/18/2018 0831   BILITOT 0.2 07/10/2017 0000   GFRNONAA 59 11/14/2019 0000   GFRNONAA 54 (L) 01/18/2018 0831   GFRAA 69 11/14/2019 0000   GFRAA 63 01/18/2018 0831       Component Value Date/Time   WBC 6.7 03/09/2020 0000   WBC 5.1 08/04/2017 0000   RBC 4.24 03/09/2020 0000   HGB 12.8 03/09/2020 0000   HGB 11.6 07/10/2017 0000   HCT 39 03/09/2020 0000   HCT 34 07/10/2017 0000   PLT 240 03/09/2020 0000   MCV 88.4 08/04/2017 0000   MCV 92.3 04/20/2014 1530   MCH 30.2 08/04/2017 0000   MCHC 34.1 08/04/2017 0000   RDW 12.2 08/04/2017 0000   RDW 13.0 08/28/2013 0945   LYMPHSABS 1,363 02/20/2017 0750   LYMPHSABS 1.3 08/28/2013 0945   MONOABS 528 10/17/2016 1512    EOSABS 141 02/20/2017 0750   EOSABS 0.3 08/28/2013 0945   BASOSABS 32 02/20/2017 0750   BASOSABS 0.0 08/28/2013 0945    No results found for: POCLITH, LITHIUM   No results found for: PHENYTOIN, PHENOBARB, VALPROATE, CBMZ   .res Assessment: Plan:    Plan:  1. Wellbutrin XL 300mg  daily 2. Increase Lexapro 15mg  to 20mg   3. Lorazepam 0.5mg  TID as needed for anxiety. 4. Remeron 30mg  at bedtime  Consider Lexapro  Continue therapy - Cathy Showfety  RTC 4 weeks  Patient advised to contact office with any questions, adverse effects, or acute worsening in signs and symptoms.  Discussed potential benefits, risk, and side effects of benzodiazepines to include potential risk of tolerance and dependence, as well as possible drowsiness.  Advised patient not to drive if experiencing drowsiness and to take lowest possible effective dose to minimize risk of dependence and tolerance    There are no diagnoses linked to this encounter.  Please see After Visit Summary for patient specific instructions.  No future appointments.  No orders of the defined types were placed in this encounter.     -------------------------------

## 2020-07-08 DIAGNOSIS — F411 Generalized anxiety disorder: Secondary | ICD-10-CM | POA: Diagnosis not present

## 2020-07-10 ENCOUNTER — Other Ambulatory Visit: Payer: Self-pay | Admitting: *Deleted

## 2020-07-10 MED ORDER — LORAZEPAM 0.5 MG PO TABS: 0.5000 mg | ORAL_TABLET | Freq: Three times a day (TID) | ORAL | 0 refills | Status: DC | PRN

## 2020-07-10 NOTE — Telephone Encounter (Signed)
Received refill request from [facility] and sent to Dr. Lyndel Safe for approval.

## 2020-07-14 DIAGNOSIS — F411 Generalized anxiety disorder: Secondary | ICD-10-CM | POA: Diagnosis not present

## 2020-07-27 DIAGNOSIS — F411 Generalized anxiety disorder: Secondary | ICD-10-CM | POA: Diagnosis not present

## 2020-07-28 ENCOUNTER — Other Ambulatory Visit: Payer: Self-pay | Admitting: Adult Health

## 2020-07-28 MED ORDER — CLONAZEPAM 0.5 MG PO TABS: ORAL_TABLET | ORAL | 2 refills | Status: DC

## 2020-07-30 DIAGNOSIS — F411 Generalized anxiety disorder: Secondary | ICD-10-CM | POA: Diagnosis not present

## 2020-07-31 ENCOUNTER — Other Ambulatory Visit: Payer: Self-pay | Admitting: Adult Health

## 2020-07-31 DIAGNOSIS — F411 Generalized anxiety disorder: Secondary | ICD-10-CM

## 2020-07-31 DIAGNOSIS — F429 Obsessive-compulsive disorder, unspecified: Secondary | ICD-10-CM

## 2020-08-01 MED ORDER — LORAZEPAM 0.5 MG PO TABS: 0.2500 mg | ORAL_TABLET | Freq: Three times a day (TID) | ORAL | 0 refills | Status: DC | PRN

## 2020-08-01 NOTE — Progress Notes (Signed)
Received call from pt's provider, [name], DNP, who was contacted by pt's therapist that Klonopin was not effective. Pt requesting script for Ativan as soon as possible. Script sent.

## 2020-08-06 ENCOUNTER — Encounter: Payer: Self-pay | Admitting: Internal Medicine

## 2020-08-06 ENCOUNTER — Non-Acute Institutional Stay: Payer: Medicare Other | Admitting: Internal Medicine

## 2020-08-06 DIAGNOSIS — F339 Major depressive disorder, recurrent, unspecified: Secondary | ICD-10-CM | POA: Diagnosis not present

## 2020-08-06 DIAGNOSIS — F411 Generalized anxiety disorder: Secondary | ICD-10-CM | POA: Diagnosis not present

## 2020-08-06 DIAGNOSIS — E785 Hyperlipidemia, unspecified: Secondary | ICD-10-CM | POA: Diagnosis not present

## 2020-08-06 DIAGNOSIS — G8929 Other chronic pain: Secondary | ICD-10-CM

## 2020-08-06 DIAGNOSIS — I10 Essential (primary) hypertension: Secondary | ICD-10-CM | POA: Diagnosis not present

## 2020-08-06 DIAGNOSIS — K219 Gastro-esophageal reflux disease without esophagitis: Secondary | ICD-10-CM | POA: Diagnosis not present

## 2020-08-06 DIAGNOSIS — M545 Low back pain, unspecified: Secondary | ICD-10-CM

## 2020-08-06 DIAGNOSIS — R42 Dizziness and giddiness: Secondary | ICD-10-CM | POA: Diagnosis not present

## 2020-08-06 NOTE — Progress Notes (Signed)
Location:    [facility] Room Number: 8 Place of Service:  ALF [phone]) Provider:  Veleta Miners MD  Virgie Dad, MD  Patient Care Team: Virgie Dad, MD as PCP - General (Internal Medicine) Melina Modena, [facility] Clent Jacks, MD as Consulting Physician (Ophthalmology) Milus Banister, MD as Consulting Physician (Gastroenterology) Norma Fredrickson, MD as Consulting Physician (Psychiatry) Jessy Oto, MD as Consulting Physician (Orthopedic Surgery) Bjorn Loser, MD as Consulting Physician (Urology)  Extended Emergency Contact Information Primary Emergency Contact: Siri, [address] [place], [place] [address] Johnnette Litter of [place] Phone: [phone] Mobile Phone: [phone] Relation: Son  Code Status:  Full Code Goals of care: Advanced Directive information Advanced Directives 08/06/2020  Does Patient Have a Medical Advance Directive? Yes  Type of Paramedic of [place];Living will  Does patient want to make changes to medical advance directive? No - Patient declined  Copy of [place] in Chart? Yes - validated most recent copy scanned in chart (See row information)  Would patient like information on creating a medical advance directive? No - Patient declined  Pre-existing out of facility DNR order (yellow form or pink MOST form) -     Chief Complaint  Patient presents with  . Acute Visit    Follow up    HPI:  Pt is a 87 y.o. female seen today for an acute visit for Follow up per Patient request  Patient has h/o Major Depression with Anxiety, Hyperlipidemia, Chronic Bilateral Low Back Pain, Spinal Stenosis , Osteoarthritis,history of osteoporosis, hypertension  [place]ives in [place]  Patient continues to have anxiety and Depression She works with Psych and they have made multiple changes in her medications she has failed multiple medications in the past. She wanted to talk to me today as she  says sometimes she feels lightheaded/dizzy. Her son was also on the call for the visit.  His main concern is that we are not missing anything medically.  We have talked about doing imaging last time including MRI of her head.  But patient got very anxious and decided to not go through it She continues to walk with the walker.  Has not had any falls.  No focal deficit.  Does follow with ophthalmologist for her blurry vision She denies any fever chills dysuria.  No constipation abdominal pain nausea or vomiting. Most of her symptoms seems to be related to anxiety.  And she gets very depressed.  Sometimes starts worrying about small things. Her therapist is also retiring in couple months.  She wants to make sure the renew her medications.  Past Medical History:  Diagnosis Date  . Abnormal mammogram, unspecified 04/19/2011  . Allergy   . Anxiety   . Backache, unspecified 01/21/2005  . Cervicalgia 08/20/2013  . Chronic kidney disease, stage II (mild) 04/03/2009  . Chronic rhinitis 09/28/2010  . DDD (degenerative disc disease)   . Depression   . Dermatophytosis of nail 01/21/2005  . Diverticulosis of colon (without mention of hemorrhage) 06/17/2005  . Edema 11/26/2004  . GERD (gastroesophageal reflux disease)   . Hiatal hernia   . High cholesterol   . Hyperglycemia 11/18/2014  . Hypertension   . Insomnia, unspecified 10/18/2011  . Left inguinal hernia 04/24/2014  . Nocturia 09/19/2008  . Osteoporosis, senile 11/18/2014  . Other abnormal blood chemistry 04/03/2007  . Pain in joint, pelvic region and thigh 11/12/2011  . Pain in joint, shoulder  region 08/15/2006  . Pain in limb 08/26/2005  . Palpitations 09/29/2009  . PAOD (peripheral arterial occlusive disease) ([place]) 11/18/2014   Right leg; diminished popliteal, dorsalis pedis, and posterior tibial artery pulsations   . Postmenopausal atrophic vaginitis 12/14/2004  . Scoliosis   . Senile osteoporosis 12/14/2004  . Tension headache 10/04/2005  . Unspecified  cataract 11/26/2004  . Unspecified constipation 01/21/2005  . Unspecified urinary incontinence 11/26/2004  . Vaginal stenosis 11/18/2014   Tight band about 1/2 inches into the vagina which will not allow passage of my index finger.    Past Surgical History:  Procedure Laterality Date  . ABDOMINAL HYSTERECTOMY  1969   Dr.Braun   . bilateral eyelid surgery  2005   Dr.Scott  . BILATERAL OOPHORECTOMY  2002  . BREAST SURGERY     benign tumor removal, left breast 1969  . CATARACT EXTRACTION     Right  . CHOLECYSTECTOMY  1990   Dr.Moore   . COLONOSCOPY  3/18/20008   inflammation, diverticular associated colitis -Dr. Ardis Hughs  . COSMETIC SURGERY    . ESOPHAGOGASTRODUODENOSCOPY N/A 01/24/2014   Procedure: ESOPHAGOGASTRODUODENOSCOPY (EGD);  Surgeon: Irene Shipper, MD;  Location: [facility] ENDOSCOPY;  Service: Endoscopy;  Laterality: N/A;  . ESOPHAGOGASTRODUODENOSCOPY ENDOSCOPY  04/04/14   Dr. Ardis Hughs  . EYE SURGERY    . FOREIGN BODY REMOVAL N/A 01/24/2014   Procedure: FOREIGN BODY REMOVAL;  Surgeon: Irene Shipper, MD;  Location: [facility];  Service: Endoscopy;  Laterality: N/A;  . HIP PINNING,CANNULATED  11/07/2011   Procedure: CANNULATED HIP PINNING;  Surgeon: Jessy Oto, MD;  Location: [facility];  Service: Orthopedics;  Laterality: Left;  . ORIF FEMORAL NECK FRACTURE W/ [facility]  11/07/2011   Dr.Nitka   . TONSILLECTOMY     Dr.Joe Tamala Julian  . TUBAL LIGATION    . VAGINAL PROLAPSE REPAIR  2002   Dr.Horback     Allergies  Allergen Reactions  . Lamictal [Lamotrigine] Swelling    Lip swelling  . Penicillins Other (See Comments)    CHILDHOOD REACTION  . Abilify [Aripiprazole] Rash    Allergies as of 08/06/2020      Reactions   Lamictal [lamotrigine] Swelling   Lip swelling   Penicillins Other (See Comments)   CHILDHOOD REACTION   Abilify [aripiprazole] Rash      Medication List       Accurate as of August 06, 2020 11:59 PM. If you have any questions, ask your nurse or doctor.         acetaminophen-codeine 300-30 MG tablet Commonly known as: TYLENOL #3 Take 1 tablet by mouth every 6 (six) hours as needed for moderate pain.   Acetylcysteine 600 MG Caps Take 1 capsule by mouth daily.   antiseptic oral rinse Liqd 15 mLs by Mouth Rinse route every 4 (four) hours as needed for dry mouth. Resident may self administer and keep in room   aspirin 81 MG chewable tablet Chew 81 mg by mouth daily.   buPROPion 300 MG 24 hr tablet Commonly known as: WELLBUTRIN XL Take 1 tablet (300 mg total) by mouth daily.   Calcium Carb-Cholecalciferol 600-800 MG-UNIT Tabs Take 1 tablet by mouth 2 (two) times daily.   chlorpheniramine-HYDROcodone 10-8 MG/5ML Suer Commonly known as: TUSSIONEX Take 5 mLs by mouth. Give 5 ml q12H PRN cough, Resident may self administer and keep in room   cholecalciferol 1000 units tablet Commonly known as: VITAMIN D Take 1,000 Units by mouth daily.   clonazePAM 0.5 MG tablet Commonly known  as: KLONOPIN Take 0.25 mg by mouth 4 (four) times daily as needed for anxiety.   docusate sodium 100 MG capsule Commonly known as: Colace Take 1 capsule (100 mg total) by mouth daily.   econazole nitrate 1 % cream Apply topically. apply once daily until till resolved, may self administer, may keep at bedside   escitalopram 20 MG tablet Commonly known as: LEXAPRO Take 1 tablet (20 mg total) by mouth daily.   Glucosamine HCl 1000 MG Tabs Take 2,000 mg by mouth 2 (two) times daily. 2 tabs   LORazepam 0.5 MG tablet Commonly known as: ATIVAN Take 0.5 tablets (0.25 mg total) by mouth 3 (three) times daily as needed for anxiety.   meloxicam 7.5 MG tablet Commonly known as: MOBIC TAKE 1 TABLET BY MOUTH EVERY DAY AS NEEDED FOR ARTHRITIS   Menthol (Topical Analgesic) 4 % Gel Apply 1 application topically as needed. Resident may self administer and keep in room   miconazole 2 % powder Commonly known as: MICOTIN Apply topically as needed for itching. apply to  legs and feet once a day   mirtazapine 15 MG tablet Commonly known as: REMERON Take 30 mg by mouth at bedtime.   multivitamin with minerals Tabs tablet Take 1 tablet by mouth daily.   NIFEdipine 60 MG 24 hr tablet Commonly known as: PROCARDIA XL/NIFEDICAL XL TAKE 1 TABLET BY MOUTH EVERY DAY TO CONTROL BLOOD PRESSURE   OcuSoft Eyelid Cleansing Pads Apply 1 application topically daily.   pantoprazole 40 MG tablet Commonly known as: PROTONIX One daily to reduce stomach acid   PEPTO-BISMOL PO Take 30 mg by mouth as needed. For diarrhea   polyethylene glycol 17 g packet Commonly known as: MIRALAX / GLYCOLAX Take 17 g by mouth daily.   pravastatin 10 MG tablet Commonly known as: PRAVACHOL Take 10 mg by mouth daily.   PRESERVISION AREDS 2+MULTI VIT PO Take 1 tablet by mouth 2 (two) times daily.   Systane 0.4-0.3 % Gel ophthalmic gel Generic drug: Polyethyl Glycol-Propyl Glycol Place 1 application into both eyes daily as needed. Special Instructions: to both eyes any time resident request; continue scheduled order As Needed   Systane 0.4-0.3 % Gel ophthalmic gel Generic drug: Polyethyl Glycol-Propyl Glycol Place 1 application into both eyes in the morning, at noon, in the evening, and at bedtime.       Review of Systems  Constitutional: Negative.   HENT: Negative.   Respiratory: Negative.   Cardiovascular: Positive for leg swelling.  Gastrointestinal: Negative.   Genitourinary: Negative.   Musculoskeletal: Positive for gait problem.  Skin: Negative.   Neurological: Positive for dizziness and light-headedness.  Psychiatric/Behavioral: Positive for dysphoric mood and sleep disturbance. The patient is nervous/anxious.     Immunization History  Administered Date(s) Administered  . Influenza Whole 02/14/2012, 02/14/2013  . Influenza, High Dose Seasonal PF 03/14/2017, 02/26/2019  . Influenza-Unspecified 02/27/2014, 02/12/2015, 02/25/2016, 03/15/2017, 02/19/2018,  02/18/2020  . Moderna Sars-Covid-2 Vaccination 05/20/2019, 06/17/2019, 03/30/2020  . Pneumococcal Conjugate-13 07/13/2015  . Pneumococcal Polysaccharide-23 05/16/1998  . Td 05/16/2004  . Zoster 05/16/2005  . Zoster Recombinat (Shingrix) 02/20/2018, 05/11/2018, 07/16/2018   Pertinent  Health Maintenance Due  Topic Date Due  . INFLUENZA VACCINE  Completed  . DEXA SCAN  Completed  . PNA vac Low Risk Adult  Completed  . MAMMOGRAM  Discontinued   Fall Risk  04/09/2019 04/25/2018 04/05/2018 04/21/2017 10/17/2016  Falls in the past year? 0 0 0 No No  Comment Emmi Telephone Survey: data to providers  prior to load - Emmi Telephone Survey: data to providers prior to load - -  Number falls in past yr: - 0 - - -  Injury with Fall? - 0 - - -   Functional Status Survey:    Vitals:   08/06/20 0943  BP: (!) 146/83  Pulse: 91  Resp: 20  Temp: 98 F (36.7 C)  SpO2: 95%  Weight: 145 lb 12.8 oz (66.1 kg)  Height: 5\' 2"  (1.575 m)   Body mass index is 26.67 kg/m. Physical Exam  Constitutional: Oriented to person, place, and time. Well-developed and well-nourished.  HENT:  Head: Normocephalic.  Mouth/Throat: Oropharynx is clear and moist.  Eyes: Pupils are equal, round, and reactive to light.  Neck: Neck supple.  Cardiovascular: Normal rate and normal heart sounds.  Positive for murmur heard. Pulmonary/Chest: Effort normal and breath sounds normal. No respiratory distress. No wheezes. She has no rales.  Abdominal: Soft. Bowel sounds are normal. No distension. There is no tenderness. There is no rebound.  Musculoskeletal: Mild edema Bilateral  Lymphadenopathy: none Neurological: Alert and oriented to person, place, and time.  Skin: Skin is warm and dry.  Psychiatric: Normal mood and affect. Behavior is normal. Thought content normal.    Labs reviewed: Recent Labs    11/14/19 0000 03/09/20 0000  NA 139 139  K 4.1 4.3  CL 103 103  CO2 29* 27*  BUN 16 23*  CREATININE 0.9 0.8   CALCIUM 10.0 10.0   Recent Labs    11/14/19 0000 03/09/20 0000  AST 16 16  ALT 14 12  ALKPHOS 57 64  ALBUMIN 3.9 4.1   Recent Labs    11/14/19 0000 03/09/20 0000  WBC 5.3 6.7  NEUTROABS 3,625  --   HGB 2.1* 12.8  HCT 36 39  PLT 211 240   Lab Results  Component Value Date   TSH 2.07 11/14/2019   Lab Results  Component Value Date   HGBA1C 5.5 09/28/2018   Lab Results  Component Value Date   CHOL 174 12/23/2019   HDL 58 12/23/2019   LDLCALC 91 12/23/2019   TRIG 99 11/08/2018   CHOLHDL 3.5 08/04/2017    Significant Diagnostic Results in last 30 days:  No results found.  Assessment/Plan Light headedness Very nonspecific symptoms No focal deficit Discussed with her and son we will start with doing blood work If everything is normal can consider imaging again. Patient does have a murmur on exam and we can consider doing echo. They agree with the plan  Primary hypertension Has been stable on Procardia GAD (generalized anxiety disorder) Right now she is on Klonopin and Ativan pRN Major depression, recurrent, chronic (HCC) Continues on Wellbutrin Lexapro and Remeron Gastroesophageal reflux disease without esophagitis Continues on Protonix Hyperlipidemia LDL goal <100 Repeat lipid panel on statin Chronic bilateral low back pain without sciatica Doing well with her walking continue on Tylenol with codeine as needed And Meloxicam PRn   Family/ staff Communication:   Labs/tests ordered:  CBC,CMP,TSH,Lipid pAnel, CRP  Total time spent in this patient care encounter was  45_  minutes; greater than 50% of the visit spent counseling patient and staff, reviewing records , Labs and coordinating care for problems addressed at this encounter.

## 2020-08-10 DIAGNOSIS — E039 Hypothyroidism, unspecified: Secondary | ICD-10-CM | POA: Diagnosis not present

## 2020-08-10 DIAGNOSIS — E785 Hyperlipidemia, unspecified: Secondary | ICD-10-CM | POA: Diagnosis not present

## 2020-08-10 DIAGNOSIS — R7982 Elevated C-reactive protein (CRP): Secondary | ICD-10-CM | POA: Diagnosis not present

## 2020-08-10 DIAGNOSIS — D649 Anemia, unspecified: Secondary | ICD-10-CM | POA: Diagnosis not present

## 2020-08-11 LAB — BASIC METABOLIC PANEL
BUN: 22 — AB (ref 4–21)
CO2: 24 — AB (ref 13–22)
Chloride: 103 (ref 99–108)
Creatinine: 0.9 (ref 0.5–1.1)
Glucose: 101
Potassium: 4.5 (ref 3.4–5.3)
Sodium: 140 (ref 137–147)

## 2020-08-11 LAB — LIPID PANEL
Cholesterol: 163 (ref 0–200)
HDL: 54 (ref 35–70)
LDL Cholesterol: 86
LDl/HDL Ratio: 3
Triglycerides: 124 (ref 40–160)

## 2020-08-11 LAB — CBC AND DIFFERENTIAL
HCT: 40 (ref 36–46)
Hemoglobin: 12.9 (ref 12.0–16.0)
Platelets: 219 (ref 150–399)
WBC: 6.6

## 2020-08-11 LAB — HEPATIC FUNCTION PANEL
ALT: 12 (ref 7–35)
AST: 19 (ref 13–35)
Alkaline Phosphatase: 63 (ref 25–125)
Bilirubin, Total: 0.3

## 2020-08-11 LAB — COMPREHENSIVE METABOLIC PANEL
Albumin: 4.1 (ref 3.5–5.0)
Calcium: 9.9 (ref 8.7–10.7)
Globulin: 2.6

## 2020-08-11 LAB — CBC: RBC: 4.36 (ref 3.87–5.11)

## 2020-08-13 ENCOUNTER — Non-Acute Institutional Stay: Payer: Medicare Other | Admitting: Internal Medicine

## 2020-08-13 ENCOUNTER — Encounter: Payer: Self-pay | Admitting: Internal Medicine

## 2020-08-13 DIAGNOSIS — K219 Gastro-esophageal reflux disease without esophagitis: Secondary | ICD-10-CM

## 2020-08-13 DIAGNOSIS — R42 Dizziness and giddiness: Secondary | ICD-10-CM | POA: Diagnosis not present

## 2020-08-13 DIAGNOSIS — I10 Essential (primary) hypertension: Secondary | ICD-10-CM | POA: Diagnosis not present

## 2020-08-13 DIAGNOSIS — F411 Generalized anxiety disorder: Secondary | ICD-10-CM

## 2020-08-13 DIAGNOSIS — R011 Cardiac murmur, unspecified: Secondary | ICD-10-CM | POA: Diagnosis not present

## 2020-08-13 DIAGNOSIS — E785 Hyperlipidemia, unspecified: Secondary | ICD-10-CM

## 2020-08-13 DIAGNOSIS — M545 Low back pain, unspecified: Secondary | ICD-10-CM | POA: Diagnosis not present

## 2020-08-13 DIAGNOSIS — F339 Major depressive disorder, recurrent, unspecified: Secondary | ICD-10-CM

## 2020-08-13 DIAGNOSIS — G8929 Other chronic pain: Secondary | ICD-10-CM

## 2020-08-13 NOTE — Progress Notes (Signed)
Location:    [facility] Room Number: 8 Place of Service:  ALF [phone]) Provider:  Veleta Miners MD  Virgie Dad, MD  Patient Care Team: Virgie Dad, MD as PCP - General (Internal Medicine) Melina Modena, [facility] Clent Jacks, MD as Consulting Physician (Ophthalmology) Milus Banister, MD as Consulting Physician (Gastroenterology) Norma Fredrickson, MD as Consulting Physician (Psychiatry) Jessy Oto, MD as Consulting Physician (Orthopedic Surgery) Bjorn Loser, MD as Consulting Physician (Urology)  Extended Emergency Contact Information Primary Emergency Contact: Shakhia, [address] [place], [place] [address] Johnnette Litter of [place] Phone: [phone] Mobile Phone: [phone] Relation: Son  Code Status:  Full Code Goals of care: Advanced Directive information Advanced Directives 08/06/2020  Does Patient Have a Medical Advance Directive? Yes  Type of Paramedic of [place];Living will  Does patient want to make changes to medical advance directive? No - Patient declined  Copy of [place] in Chart? Yes - validated most recent copy scanned in chart (See row information)  Would patient like information on creating a medical advance directive? No - Patient declined  Pre-existing out of facility DNR order (yellow form or pink MOST form) -     Chief Complaint  Patient presents with  . Acute Visit    HPI:  Pt is a 87 y.o. female seen today for an acute visit for Follow up of her Labs   Patient continues to have anxiety and Depression She works with Psych and they have made multiple changes in her medications she has failed multiple medications in the past.  We have talked about doing imaging last time including MRI of her head.  But patient got very anxious and decided to not go through it On my last visit I have discussed with the son for her nonspecific symptoms of lightheadedness.  We have  ordered labs.  All her labs came back normal. This was a follow-up .  Patient continues to stay very anxious.  Today she was complaining that she has blurry vision she cannot see.  Has appointment to see ophthalmologist in couple of days .no redness or pain in her eyes.  Continues to walk with her walker no focal deficits. Discussed again about imaging like MRI of her head.  Both patient and son want to wait right now   Past Medical History:  Diagnosis Date  . Abnormal mammogram, unspecified 04/19/2011  . Allergy   . Anxiety   . Backache, unspecified 01/21/2005  . Cervicalgia 08/20/2013  . Chronic kidney disease, stage II (mild) 04/03/2009  . Chronic rhinitis 09/28/2010  . DDD (degenerative disc disease)   . Depression   . Dermatophytosis of nail 01/21/2005  . Diverticulosis of colon (without mention of hemorrhage) 06/17/2005  . Edema 11/26/2004  . GERD (gastroesophageal reflux disease)   . Hiatal hernia   . High cholesterol   . Hyperglycemia 11/18/2014  . Hypertension   . Insomnia, unspecified 10/18/2011  . Left inguinal hernia 04/24/2014  . Nocturia 09/19/2008  . Osteoporosis, senile 11/18/2014  . Other abnormal blood chemistry 04/03/2007  . Pain in joint, pelvic region and thigh 11/12/2011  . Pain in joint, shoulder region 08/15/2006  . Pain in limb 08/26/2005  . Palpitations 09/29/2009  . PAOD (peripheral arterial occlusive disease) ([place]) 11/18/2014   Right leg; diminished popliteal, dorsalis pedis, and posterior tibial artery pulsations   . Postmenopausal atrophic vaginitis 12/14/2004  . Scoliosis   .  Senile osteoporosis 12/14/2004  . Tension headache 10/04/2005  . Unspecified cataract 11/26/2004  . Unspecified constipation 01/21/2005  . Unspecified urinary incontinence 11/26/2004  . Vaginal stenosis 11/18/2014   Tight band about 1/2 inches into the vagina which will not allow passage of my index finger.    Past Surgical History:  Procedure Laterality Date  . ABDOMINAL HYSTERECTOMY  1969   Dr.Braun    . bilateral eyelid surgery  2005   Dr.Scott  . BILATERAL OOPHORECTOMY  2002  . BREAST SURGERY     benign tumor removal, left breast 1969  . CATARACT EXTRACTION     Right  . CHOLECYSTECTOMY  1990   Dr.Moore   . COLONOSCOPY  3/18/20008   inflammation, diverticular associated colitis -Dr. Ardis Hughs  . COSMETIC SURGERY    . ESOPHAGOGASTRODUODENOSCOPY N/A 01/24/2014   Procedure: ESOPHAGOGASTRODUODENOSCOPY (EGD);  Surgeon: Irene Shipper, MD;  Location: [facility] ENDOSCOPY;  Service: Endoscopy;  Laterality: N/A;  . ESOPHAGOGASTRODUODENOSCOPY ENDOSCOPY  04/04/14   Dr. Ardis Hughs  . EYE SURGERY    . FOREIGN BODY REMOVAL N/A 01/24/2014   Procedure: FOREIGN BODY REMOVAL;  Surgeon: Irene Shipper, MD;  Location: [facility];  Service: Endoscopy;  Laterality: N/A;  . HIP PINNING,CANNULATED  11/07/2011   Procedure: CANNULATED HIP PINNING;  Surgeon: Jessy Oto, MD;  Location: [facility];  Service: Orthopedics;  Laterality: Left;  . ORIF FEMORAL NECK FRACTURE W/ [facility]  11/07/2011   Dr.Nitka   . TONSILLECTOMY     Dr.Joe Tamala Julian  . TUBAL LIGATION    . VAGINAL PROLAPSE REPAIR  2002   Dr.Horback     Allergies  Allergen Reactions  . Lamictal [Lamotrigine] Swelling    Lip swelling  . Penicillins Other (See Comments)    CHILDHOOD REACTION  . Abilify [Aripiprazole] Rash    Allergies as of 08/13/2020      Reactions   Lamictal [lamotrigine] Swelling   Lip swelling   Penicillins Other (See Comments)   CHILDHOOD REACTION   Abilify [aripiprazole] Rash      Medication List       Accurate as of August 13, 2020  1:13 PM. If you have any questions, ask your nurse or doctor.        acetaminophen-codeine 300-30 MG tablet Commonly known as: TYLENOL #3 Take 1 tablet by mouth every 6 (six) hours as needed for moderate pain.   Acetylcysteine 600 MG Caps Take 1 capsule by mouth daily.   antiseptic oral rinse Liqd 15 mLs by Mouth Rinse route every 4 (four) hours as needed for dry mouth. Resident may self administer  and keep in room   aspirin 81 MG chewable tablet Chew 81 mg by mouth daily.   buPROPion 300 MG 24 hr tablet Commonly known as: WELLBUTRIN XL Take 1 tablet (300 mg total) by mouth daily.   Calcium Carb-Cholecalciferol 600-800 MG-UNIT Tabs Take 1 tablet by mouth 2 (two) times daily.   chlorpheniramine-HYDROcodone 10-8 MG/5ML Suer Commonly known as: TUSSIONEX Take 5 mLs by mouth. Give 5 ml q12H PRN cough, Resident may self administer and keep in room   cholecalciferol 1000 units tablet Commonly known as: VITAMIN D Take 1,000 Units by mouth daily.   clonazePAM 0.5 MG tablet Commonly known as: KLONOPIN Take 0.25 mg by mouth 4 (four) times daily as needed for anxiety.   docusate sodium 100 MG capsule Commonly known as: Colace Take 1 capsule (100 mg total) by mouth daily.   econazole nitrate 1 % cream Apply topically. apply once  daily until till resolved, may self administer, may keep at bedside   escitalopram 20 MG tablet Commonly known as: LEXAPRO Take 1 tablet (20 mg total) by mouth daily.   Glucosamine HCl 1000 MG Tabs Take 2,000 mg by mouth 2 (two) times daily. 2 tabs   LORazepam 0.5 MG tablet Commonly known as: ATIVAN Take 0.5 tablets (0.25 mg total) by mouth 3 (three) times daily as needed for anxiety.   meloxicam 7.5 MG tablet Commonly known as: MOBIC TAKE 1 TABLET BY MOUTH EVERY DAY AS NEEDED FOR ARTHRITIS   Menthol (Topical Analgesic) 4 % Gel Apply 1 application topically as needed. Resident may self administer and keep in room   miconazole 2 % powder Commonly known as: MICOTIN Apply topically as needed for itching. apply to legs and feet once a day   mirtazapine 15 MG tablet Commonly known as: REMERON Take 30 mg by mouth at bedtime.   multivitamin with minerals Tabs tablet Take 1 tablet by mouth daily.   NIFEdipine 60 MG 24 hr tablet Commonly known as: PROCARDIA XL/NIFEDICAL XL TAKE 1 TABLET BY MOUTH EVERY DAY TO CONTROL BLOOD PRESSURE   OcuSoft  Eyelid Cleansing Pads Apply 1 application topically daily.   pantoprazole 40 MG tablet Commonly known as: PROTONIX One daily to reduce stomach acid   PEPTO-BISMOL PO Take 30 mg by mouth as needed. For diarrhea   polyethylene glycol 17 g packet Commonly known as: MIRALAX / GLYCOLAX Take 17 g by mouth daily.   pravastatin 10 MG tablet Commonly known as: PRAVACHOL Take 10 mg by mouth daily.   PRESERVISION AREDS 2+MULTI VIT PO Take 1 tablet by mouth 2 (two) times daily.   Systane 0.4-0.3 % Gel ophthalmic gel Generic drug: Polyethyl Glycol-Propyl Glycol Place 1 application into both eyes daily as needed. Special Instructions: to both eyes any time resident request; continue scheduled order As Needed   Systane 0.4-0.3 % Gel ophthalmic gel Generic drug: Polyethyl Glycol-Propyl Glycol Place 1 application into both eyes in the morning, at noon, in the evening, and at bedtime.       Review of Systems  Constitutional: Negative.   HENT: Negative.   Respiratory: Negative.   Cardiovascular: Negative.   Gastrointestinal: Negative.   Genitourinary: Negative.   Musculoskeletal: Negative.   Skin: Negative.   Neurological: Positive for light-headedness.  Psychiatric/Behavioral: Positive for dysphoric mood. The patient is nervous/anxious.     Immunization History  Administered Date(s) Administered  . Influenza Whole 02/14/2012, 02/14/2013  . Influenza, High Dose Seasonal PF 03/14/2017, 02/26/2019  . Influenza-Unspecified 02/27/2014, 02/12/2015, 02/25/2016, 03/15/2017, 02/19/2018, 02/18/2020  . Moderna Sars-Covid-2 Vaccination 05/20/2019, 06/17/2019, 03/30/2020  . Pneumococcal Conjugate-13 07/13/2015  . Pneumococcal Polysaccharide-23 05/16/1998  . Td 05/16/2004  . Zoster 05/16/2005  . Zoster Recombinat (Shingrix) 02/20/2018, 05/11/2018, 07/16/2018   Pertinent  Health Maintenance Due  Topic Date Due  . INFLUENZA VACCINE  Completed  . DEXA SCAN  Completed  . PNA vac Low Risk  Adult  Completed  . MAMMOGRAM  Discontinued   Fall Risk  04/09/2019 04/25/2018 04/05/2018 04/21/2017 10/17/2016  Falls in the past year? 0 0 0 No No  Comment Emmi Telephone Survey: data to providers prior to load - Emmi Telephone Survey: data to providers prior to load - -  Number falls in past yr: - 0 - - -  Injury with Fall? - 0 - - -   Functional Status Survey:    Vitals:   08/13/20 0946  BP: (!) 142/88  Pulse: 85  Resp: 18  Temp: 97.7 F (36.5 C)  SpO2: 94%  Weight: 145 lb 12.8 oz (66.1 kg)  Height: 5\' 2"  (1.575 m)   Body mass index is 26.67 kg/m. Physical Exam Vitals reviewed.  Constitutional:      Appearance: Normal appearance.  HENT:     Head: Normocephalic.     Nose: Nose normal.     Mouth/Throat:     Mouth: Mucous membranes are moist.     Pharynx: Oropharynx is clear.  Eyes:     Pupils: Pupils are equal, round, and reactive to light.     Comments: No Redness   Cardiovascular:     Rate and Rhythm: Normal rate and regular rhythm.     Pulses: Normal pulses.     Heart sounds: Murmur heard.    Pulmonary:     Effort: Pulmonary effort is normal.     Breath sounds: Normal breath sounds.  Abdominal:     General: Abdomen is flat. Bowel sounds are normal.     Palpations: Abdomen is soft.  Musculoskeletal:        General: No swelling.     Cervical back: Neck supple.  Skin:    General: Skin is warm.  Neurological:     General: No focal deficit present.     Mental Status: She is alert and oriented to person, place, and time.  Psychiatric:     Comments: Stays very Anxious Tearful.      Labs reviewed: Recent Labs    11/14/19 0000 03/09/20 0000 08/11/20 0000  NA 139 139 140  K 4.1 4.3 4.5  CL 103 103 103  CO2 29* 27* 24*  BUN 16 23* 22*  CREATININE 0.9 0.8 0.9  CALCIUM 10.0 10.0 9.9   Recent Labs    11/14/19 0000 03/09/20 0000 08/11/20 0000  AST 16 16 19   ALT 14 12 12   ALKPHOS 57 64 63  ALBUMIN 3.9 4.1 4.1   Recent Labs    11/14/19 0000  03/09/20 0000 08/11/20 0000  WBC 5.3 6.7 6.6  NEUTROABS 3,625  --   --   HGB 2.1* 12.8 12.9  HCT 36 39 40  PLT 211 240 219   Lab Results  Component Value Date   TSH 2.07 11/14/2019   Lab Results  Component Value Date   HGBA1C 5.5 09/28/2018   Lab Results  Component Value Date   CHOL 163 08/11/2020   HDL 54 08/11/2020   LDLCALC 86 08/11/2020   TRIG 124 08/11/2020   CHOLHDL 3.5 08/04/2017    Significant Diagnostic Results in last 30 days:  No results found.  Assessment/Plan  Light headedness All labs done in the facilities were normal Discussed with the son.  At this time they want to wait for imaging at but causes a lot of anxiety to the patient.  If she has any worsening of symptoms would reconsider Cardiac murmur She did agree for cardiac echo which would be done here in the facility Vision complains No redness eye pain in her eyes She has had multiple work-up by her ophthalmologist She has appointment to see them in couple of days  Primary hypertension On Procardia  GAD (generalized anxiety disorder) Continues to be her major problem Right now on Klonopin and Ativan as needed Major depression, recurrent, chronic (HCC) On Wellbutrin Lexapro and Remeron Gastroesophageal reflux disease without esophagitis Continue on Protonix Hyperlipidemia LDL goal <100 LDL less than 100 Chronic bilateral low back pain without sciatica Tylenol with codeine as  needed  Family/ staff Communication:   Labs/tests ordered:

## 2020-08-14 ENCOUNTER — Telehealth: Payer: Self-pay | Admitting: Adult Health

## 2020-08-14 ENCOUNTER — Other Ambulatory Visit: Payer: Self-pay | Admitting: Adult Health

## 2020-08-14 DIAGNOSIS — F429 Obsessive-compulsive disorder, unspecified: Secondary | ICD-10-CM

## 2020-08-14 DIAGNOSIS — F411 Generalized anxiety disorder: Secondary | ICD-10-CM

## 2020-08-14 MED ORDER — LORAZEPAM 0.5 MG PO TABS
0.5000 mg | ORAL_TABLET | Freq: Three times a day (TID) | ORAL | 2 refills | Status: DC | PRN
Start: 2020-08-14 — End: 2020-08-28

## 2020-08-14 NOTE — Telephone Encounter (Signed)
I will send an order to her pharmacy.  Script sent.

## 2020-08-14 NOTE — Telephone Encounter (Signed)
She is to continue what she has been taking.

## 2020-08-14 NOTE — Telephone Encounter (Signed)
There is some confusion.What you sent on 3/19 is  LORazepam (ATIVAN) 0.5 MG tablet Take 0.5 tablets (0.25 mg total) by mouth 3 (three) times daily as needed for anxiety. That is also what the package says. The pharmacy won't fill it until you clarify the dose,I believe they are trying to give her a 0.25 mg tablet

## 2020-08-14 NOTE — Telephone Encounter (Signed)
Please review

## 2020-08-14 NOTE — Telephone Encounter (Signed)
Can you call and ask Ms [place] if she has a package with previous instructions. The 0.25mg  was the dose I was given by the staff there.

## 2020-08-14 NOTE — Telephone Encounter (Signed)
Received a phone call from Nash with [facility] regarding Tina Hampton's Lorazepam. Janett Billow had called & had written a new RX for Lorazepam .25 mg, 3 times a day. The nursing staff said that [facility] has said that her prescription is for 0.5 mg and the staff said this is the dosage that is in her medical chart. They need to have the correct dosage faxed to them at [phone]. Their phone number is [phone].

## 2020-08-14 NOTE — Telephone Encounter (Signed)
They want you to fax another order to them

## 2020-08-18 DIAGNOSIS — F411 Generalized anxiety disorder: Secondary | ICD-10-CM | POA: Diagnosis not present

## 2020-08-19 DIAGNOSIS — H04123 Dry eye syndrome of bilateral lacrimal glands: Secondary | ICD-10-CM | POA: Diagnosis not present

## 2020-08-24 ENCOUNTER — Telehealth: Payer: Self-pay | Admitting: Adult Health

## 2020-08-24 NOTE — Telephone Encounter (Signed)
Rtc to pt and she reports feeling more anxious about her eyes, she went to see her ophthalmologist and he's not finding anything wrong. Informed her she could take an extra one, she said she's only taken 2 today and will take one now. She won't take one later if she doesn't need it. Advised her to call if symptoms worsen.    She has apt 4/18.

## 2020-08-24 NOTE — Telephone Encounter (Signed)
Noted  

## 2020-08-24 NOTE — Telephone Encounter (Signed)
Pt reports increased anxiety/nervousness.She stated her head is dizzy and it feels bad.She wants to know if its ok to increase and if not,she wants to know why she can't increase.She said she is miserable and wants a call back today.

## 2020-08-24 NOTE — Telephone Encounter (Signed)
Patient Tina Hampton requesting to increase her Lorazepam dosage. She is currently taking it three times a day. She is requesting to take it 4 times a day due to increase anxiety. Please advise # [phone].

## 2020-08-24 NOTE — Telephone Encounter (Signed)
Can you follow up with her? Recently added Ativan 0.5mg  TID. Had been taking Clonazapam prior.

## 2020-08-28 ENCOUNTER — Other Ambulatory Visit: Payer: Self-pay | Admitting: Adult Health

## 2020-08-28 DIAGNOSIS — F429 Obsessive-compulsive disorder, unspecified: Secondary | ICD-10-CM

## 2020-08-28 DIAGNOSIS — F411 Generalized anxiety disorder: Secondary | ICD-10-CM

## 2020-08-28 MED ORDER — LORAZEPAM 0.5 MG PO TABS: 0.5000 mg | ORAL_TABLET | Freq: Three times a day (TID) | ORAL | 0 refills | Status: DC | PRN

## 2020-08-31 ENCOUNTER — Non-Acute Institutional Stay: Payer: Medicare Other | Admitting: Orthopedic Surgery

## 2020-08-31 ENCOUNTER — Encounter: Payer: Self-pay | Admitting: Orthopedic Surgery

## 2020-08-31 ENCOUNTER — Telehealth (INDEPENDENT_AMBULATORY_CARE_PROVIDER_SITE_OTHER): Payer: Medicare Other | Admitting: Adult Health

## 2020-08-31 DIAGNOSIS — F429 Obsessive-compulsive disorder, unspecified: Secondary | ICD-10-CM

## 2020-08-31 DIAGNOSIS — R011 Cardiac murmur, unspecified: Secondary | ICD-10-CM | POA: Diagnosis not present

## 2020-08-31 DIAGNOSIS — F331 Major depressive disorder, recurrent, moderate: Secondary | ICD-10-CM | POA: Diagnosis not present

## 2020-08-31 DIAGNOSIS — E785 Hyperlipidemia, unspecified: Secondary | ICD-10-CM

## 2020-08-31 DIAGNOSIS — G4700 Insomnia, unspecified: Secondary | ICD-10-CM

## 2020-08-31 DIAGNOSIS — K5900 Constipation, unspecified: Secondary | ICD-10-CM | POA: Diagnosis not present

## 2020-08-31 DIAGNOSIS — F339 Major depressive disorder, recurrent, unspecified: Secondary | ICD-10-CM

## 2020-08-31 DIAGNOSIS — I10 Essential (primary) hypertension: Secondary | ICD-10-CM

## 2020-08-31 DIAGNOSIS — N939 Abnormal uterine and vaginal bleeding, unspecified: Secondary | ICD-10-CM

## 2020-08-31 DIAGNOSIS — F411 Generalized anxiety disorder: Secondary | ICD-10-CM

## 2020-08-31 DIAGNOSIS — K219 Gastro-esophageal reflux disease without esophagitis: Secondary | ICD-10-CM | POA: Diagnosis not present

## 2020-08-31 NOTE — Progress Notes (Signed)
Location:  [facility] Room Number: 8 Place of Service:  ALF [phone]) Provider: Windell Moulding, AGNP-C  Virgie Dad, MD  Patient Care Team: Virgie Dad, MD as PCP - General (Internal Medicine) Melina Modena, [facility] Clent Jacks, MD as Consulting Physician (Ophthalmology) Milus Banister, MD as Consulting Physician (Gastroenterology) Norma Fredrickson, MD as Consulting Physician (Psychiatry) Jessy Oto, MD as Consulting Physician (Orthopedic Surgery) Bjorn Loser, MD as Consulting Physician (Urology)  Extended Emergency Contact Information Primary Emergency Contact: [place], [place] [address] [address]itter of [place] Phone: [phone] Mobile Phone: [phone] Relation: Son   Goals of care: Advanced Directive information Advanced Directives 08/06/2020  Does Patient Have a Medical Advance Directive? Yes  Type of Paramedic of [place];Living will  Does patient want to make changes to medical advance directive? No - Patient declined  Copy of [place] in Chart? Yes - validated most recent copy scanned in chart (See row information)  Would patient like information on creating a medical advance directive? No - Patient declined  Pre-existing out of facility DNR order (yellow form or pink MOST form) -     Chief Complaint  Patient presents with  . Medical Management of Chronic Issues    HPI:   Pt is a 87 y.o. female seen today for medical management of chronic diseases.    Today, she is sitting in her recliner during encounter. Alert and oriented x 3. Follows commands and can express needs. Her main complaint is always feeling nervous and anxious. When her anxiety is increased she will have rapid blinking of her eyes. Denies eye issues besides poor eyesight. She is followed by psych and counseling service. Telehealth visit with NP scheduled this morning. Remains on ativan and  wellbutrin. Continues to administer her own medications.   Denies increased depression, but states she has been in and out of depression most of her life.    Murmur heard last month, echo ordered to be done at [facility]. Echo has not been completed yet. Denies chest pain or sob.   Reports vaginal bleeding. Noticed once a few days ago. Does not know if it was due to hemorrhoid. Bleeding has not returned. Denies abdominal pain. History of diverticulitis in past. Remains on colace and miralax for constipation.   No recent falls or injuries.   Recent weights are as follows:  04/01- 145.4 lbs  03/01-  145.8 lbs  02/01- 146.8 lbs  Nurse does not report any concerns, vitals stable.        Past Medical History:  Diagnosis Date  . Abnormal mammogram, unspecified 04/19/2011  . Allergy   . Anxiety   . Backache, unspecified 01/21/2005  . Cervicalgia 08/20/2013  . Chronic kidney disease, stage II (mild) 04/03/2009  . Chronic rhinitis 09/28/2010  . DDD (degenerative disc disease)   . Depression   . Dermatophytosis of nail 01/21/2005  . Diverticulosis of colon (without mention of hemorrhage) 06/17/2005  . Edema 11/26/2004  . GERD (gastroesophageal reflux disease)   . Hiatal hernia   . High cholesterol   . Hyperglycemia 11/18/2014  . Hypertension   . Insomnia, unspecified 10/18/2011  . Left inguinal hernia 04/24/2014  . Nocturia 09/19/2008  . Osteoporosis, senile 11/18/2014  . Other abnormal blood chemistry 04/03/2007  . Pain in joint, pelvic region and thigh 11/12/2011  . Pain in joint, shoulder region 08/15/2006  . Pain in limb  08/26/2005  . Palpitations 09/29/2009  . PAOD (peripheral arterial occlusive disease) ([place]) 11/18/2014   Right leg; diminished popliteal, dorsalis pedis, and posterior tibial artery pulsations   . Postmenopausal atrophic vaginitis 12/14/2004  . Scoliosis   . Senile osteoporosis 12/14/2004  . Tension headache 10/04/2005  . Unspecified cataract 11/26/2004  . Unspecified  constipation 01/21/2005  . Unspecified urinary incontinence 11/26/2004  . Vaginal stenosis 11/18/2014   Tight band about 1/2 inches into the vagina which will not allow passage of my index finger.    Past Surgical History:  Procedure Laterality Date  . ABDOMINAL HYSTERECTOMY  1969   Dr.Braun   . bilateral eyelid surgery  2005   Dr.Scott  . BILATERAL OOPHORECTOMY  2002  . BREAST SURGERY     benign tumor removal, left breast 1969  . CATARACT EXTRACTION     Right  . CHOLECYSTECTOMY  1990   Dr.Moore   . COLONOSCOPY  3/18/20008   inflammation, diverticular associated colitis -Dr. Ardis Hughs  . COSMETIC SURGERY    . ESOPHAGOGASTRODUODENOSCOPY N/A 01/24/2014   Procedure: ESOPHAGOGASTRODUODENOSCOPY (EGD);  Surgeon: Irene Shipper, MD;  Location: [facility] ENDOSCOPY;  Service: Endoscopy;  Laterality: N/A;  . ESOPHAGOGASTRODUODENOSCOPY ENDOSCOPY  04/04/14   Dr. Ardis Hughs  . EYE SURGERY    . FOREIGN BODY REMOVAL N/A 01/24/2014   Procedure: FOREIGN BODY REMOVAL;  Surgeon: Irene Shipper, MD;  Location: [place];  Service: Endoscopy;  Laterality: N/A;  . HIP PINNING,CANNULATED  11/07/2011   Procedure: CANNULATED HIP PINNING;  Surgeon: Jessy Oto, MD;  Location: [facility];  Service: Orthopedics;  Laterality: Left;  . ORIF FEMORAL NECK FRACTURE W/ [facility]  11/07/2011   Dr.Nitka   . TONSILLECTOMY     Dr.Joe Tamala Julian  . TUBAL LIGATION    . VAGINAL PROLAPSE REPAIR  2002   Dr.Horback     Allergies  Allergen Reactions  . Lamictal [Lamotrigine] Swelling    Lip swelling  . Penicillins Other (See Comments)    CHILDHOOD REACTION  . Abilify [Aripiprazole] Rash    Outpatient Encounter Medications as of 08/31/2020  Medication Sig  . acetaminophen-codeine (TYLENOL #3) 300-30 MG tablet Take 1 tablet by mouth every 6 (six) hours as needed for moderate pain.  . Acetylcysteine 600 MG CAPS Take 1 capsule by mouth daily.  Marland Kitchen antiseptic oral rinse (BIOTENE) LIQD 15 mLs by Mouth Rinse route every 4 (four) hours as needed for dry  mouth. Resident may self administer and keep in room  . aspirin 81 MG chewable tablet Chew 81 mg by mouth daily.  . Bismuth Subsalicylate (PEPTO-BISMOL PO) Take 30 mg by mouth as needed. For diarrhea  . buPROPion (WELLBUTRIN XL) 300 MG 24 hr tablet Take 1 tablet (300 mg total) by mouth daily.  . Calcium Carb-Cholecalciferol 600-800 MG-UNIT TABS Take 1 tablet by mouth 2 (two) times daily.  . chlorpheniramine-HYDROcodone (TUSSIONEX) 10-8 MG/5ML SUER Take 5 mLs by mouth. Give 5 ml q12H PRN cough, Resident may self administer and keep in room  . cholecalciferol (VITAMIN D) 1000 UNITS tablet Take 1,000 Units by mouth daily.  Marland Kitchen docusate sodium (COLACE) 100 MG capsule Take 1 capsule (100 mg total) by mouth daily.  Marland Kitchen econazole nitrate 1 % cream Apply topically. apply once daily until till resolved, may self administer, may keep at bedside  . escitalopram (LEXAPRO) 20 MG tablet Take 1 tablet (20 mg total) by mouth daily.  . Eyelid Cleansers (OCUSOFT EYELID CLEANSING) PADS Apply 1 application topically daily.  . Glucosamine HCl  1000 MG TABS Take 2,000 mg by mouth 2 (two) times daily. 2 tabs  . LORazepam (ATIVAN) 0.5 MG tablet Take 1 tablet (0.5 mg total) by mouth 3 (three) times daily as needed for anxiety.  . meloxicam (MOBIC) 7.5 MG tablet TAKE 1 TABLET BY MOUTH EVERY DAY AS NEEDED FOR ARTHRITIS  . Menthol, Topical Analgesic, 4 % GEL Apply 1 application topically as needed. Resident may self administer and keep in room  . miconazole (MICOTIN) 2 % powder Apply topically as needed for itching. apply to legs and feet once a day  . mirtazapine (REMERON) 15 MG tablet Take 30 mg by mouth at bedtime.   . Multiple Vitamin (MULTIVITAMIN WITH MINERALS) TABS Take 1 tablet by mouth daily.  . Multiple Vitamins-Minerals (PRESERVISION AREDS 2+MULTI VIT PO) Take 1 tablet by mouth 2 (two) times daily.  Marland Kitchen NIFEdipine (PROCARDIA XL/ADALAT-CC) 60 MG 24 hr tablet TAKE 1 TABLET BY MOUTH EVERY DAY TO CONTROL BLOOD PRESSURE  .  pantoprazole (PROTONIX) 40 MG tablet One daily to reduce stomach acid  . Polyethyl Glycol-Propyl Glycol (SYSTANE) 0.4-0.3 % GEL ophthalmic gel Place 1 application into both eyes daily as needed. Special Instructions: to both eyes any time resident request; continue scheduled order As Needed  . Polyethyl Glycol-Propyl Glycol (SYSTANE) 0.4-0.3 % GEL ophthalmic gel Place 1 application into both eyes in the morning, at noon, in the evening, and at bedtime.  . polyethylene glycol (MIRALAX / GLYCOLAX) packet Take 17 g by mouth daily.   . pravastatin (PRAVACHOL) 10 MG tablet Take 10 mg by mouth daily.   No facility-administered encounter medications on file as of 08/31/2020.    Review of Systems  Constitutional: Negative for activity change, appetite change, fatigue and fever.  HENT: Negative for dental problem, hearing loss and trouble swallowing.   Eyes: Positive for visual disturbance.       Glasses  Respiratory: Negative for cough, shortness of breath and wheezing.   Cardiovascular: Positive for leg swelling. Negative for chest pain.  Gastrointestinal: Positive for constipation. Negative for abdominal distention, abdominal pain, diarrhea and nausea.  Genitourinary: Positive for vaginal bleeding. Negative for dysuria, hematuria and urgency.  Musculoskeletal: Positive for arthralgias and myalgias.  Skin:       Dry skin  Neurological: Positive for weakness. Negative for dizziness and headaches.  Psychiatric/Behavioral: Positive for dysphoric mood. Negative for confusion, sleep disturbance and suicidal ideas. The patient is nervous/anxious.     Immunization History  Administered Date(s) Administered  . Influenza Whole 02/14/2012, 02/14/2013  . Influenza, High Dose Seasonal PF 03/14/2017, 02/26/2019  . Influenza-Unspecified 02/27/2014, 02/12/2015, 02/25/2016, 03/15/2017, 02/19/2018, 02/18/2020  . Moderna Sars-Covid-2 Vaccination 05/20/2019, 06/17/2019, 03/30/2020  . Pneumococcal Conjugate-13  07/13/2015  . Pneumococcal Polysaccharide-23 05/16/1998  . Td 05/16/2004  . Zoster 05/16/2005  . Zoster Recombinat (Shingrix) 02/20/2018, 05/11/2018, 07/16/2018   Pertinent  Health Maintenance Due  Topic Date Due  . INFLUENZA VACCINE  12/14/2020  . DEXA SCAN  Completed  . PNA vac Low Risk Adult  Completed  . MAMMOGRAM  Discontinued   Fall Risk  04/09/2019 04/25/2018 04/05/2018 04/21/2017 10/17/2016  Falls in the past year? 0 0 0 No No  Comment Emmi Telephone Survey: data to providers prior to load - Emmi Telephone Survey: data to providers prior to load - -  Number falls in past yr: - 0 - - -  Injury with Fall? - 0 - - -   Functional Status Survey:    Vitals:   08/31/20 1626  BP:  138/66  Pulse: 81  Resp: (!) 22  Temp: (!) 97 F (36.1 C)  SpO2: 96%  Weight: 145 lb 6.4 oz (66 kg)  Height: 5\' 2"  (1.575 m)   Body mass index is 26.59 kg/m. Physical Exam Vitals reviewed.  Constitutional:      General: She is not in acute distress. HENT:     Head: Normocephalic.     Right Ear: There is no impacted cerumen.     Left Ear: There is no impacted cerumen.     Nose: Nose normal.     Mouth/Throat:     Mouth: Mucous membranes are moist.  Eyes:     General:        Right eye: No discharge.        Left eye: No discharge.  Neck:     Thyroid: No thyroid mass or thyromegaly.  Cardiovascular:     Rate and Rhythm: Normal rate and regular rhythm.     Pulses: Normal pulses.     Heart sounds: Murmur heard.    Pulmonary:     Effort: Pulmonary effort is normal. No respiratory distress.     Breath sounds: Normal breath sounds.  Abdominal:     General: Bowel sounds are normal. There is no distension.     Palpations: Abdomen is soft.     Tenderness: There is no abdominal tenderness.  Musculoskeletal:     Cervical back: Normal range of motion.     Right lower leg: Edema present.     Left lower leg: Edema present.     Comments: Non-pitting  Lymphadenopathy:     Cervical: No cervical  adenopathy.  Skin:    General: Skin is warm and dry.     Capillary Refill: Capillary refill takes less than 2 seconds.  Neurological:     General: No focal deficit present.     Mental Status: She is alert and oriented to person, place, and time.     Motor: Weakness present.     Gait: Gait abnormal.     Comments: walker  Psychiatric:        Attention and Perception: Attention normal.        Mood and Affect: Mood is anxious.     Labs reviewed: Recent Labs    11/14/19 0000 03/09/20 0000 08/11/20 0000  NA 139 139 140  K 4.1 4.3 4.5  CL 103 103 103  CO2 29* 27* 24*  BUN 16 23* 22*  CREATININE 0.9 0.8 0.9  CALCIUM 10.0 10.0 9.9   Recent Labs    11/14/19 0000 03/09/20 0000 08/11/20 0000  AST 16 16 19   ALT 14 12 12   ALKPHOS 57 64 63  ALBUMIN 3.9 4.1 4.1   Recent Labs    11/14/19 0000 03/09/20 0000 08/11/20 0000  WBC 5.3 6.7 6.6  NEUTROABS 3,625  --   --   HGB 2.1* 12.8 12.9  HCT 36 39 40  PLT 211 240 219   Lab Results  Component Value Date   TSH 2.07 11/14/2019   Lab Results  Component Value Date   HGBA1C 5.5 09/28/2018   Lab Results  Component Value Date   CHOL 163 08/11/2020   HDL 54 08/11/2020   LDLCALC 86 08/11/2020   TRIG 124 08/11/2020   CHOLHDL 3.5 08/04/2017    Significant Diagnostic Results in last 30 days:  No results found.  Assessment/Plan 1. Generalized anxiety disorder - followed by psych, denies panic attacks, sleeping well - cont Wellbutrin and  ativan prn  - discussed meditation and deep breathing   2. Major depression, recurrent, chronic (HCC) - long history of depression - cont wellbutrin  3. Cardiac murmur - asymptomatic, echo ordered, not completed yet  4. Primary hypertension - bp at goal < 150/90 - cont procardia  5. Gastroesophageal reflux disease without esophagitis - stable with Protonix - cont to avoid food triggers  6. Hyperlipidemia LDL goal <100 - stable with statin  7. Abnormal vaginal bleeding -  single event, unclear if from hemorrhoid or vagina - advised to tell nursing staff if bleeding reappears - may consider abdominal u/s  8. Constipation, unspecified constipation type - stable with colace and miralax    Family/ staff Communication: plan discussed with patient and nurse  Labs/tests ordered: none

## 2020-08-31 NOTE — Progress Notes (Signed)
Tina Hampton [id number] 09/01/1925 87 y.o.  Virtual Visit via Telephone Note  I connected with pt on 08/31/20 at 11:40 AM EDT by telephone and verified that I am speaking with the correct person using two identifiers.   I discussed the limitations, risks, security and privacy concerns of performing an evaluation and management service by telephone and the availability of in person appointments. I also discussed with the patient that there may be a patient responsible charge related to this service. The patient expressed understanding and agreed to proceed.   I discussed the assessment and treatment plan with the patient. The patient was provided an opportunity to ask questions and all were answered. The patient agreed with the plan and demonstrated an understanding of the instructions.   The patient was advised to call back or seek an in-person evaluation if the symptoms worsen or if the condition fails to improve as anticipated.  I provided 30 minutes of non-face-to-face time during this encounter.  The patient was located at home.  The provider was located at [place].   Aloha Gell, NP   Subjective:   Patient ID:  Tina Hampton is a 87 y.o. (DOB 1926-03-21) female.  Chief Complaint: No chief complaint on file.   HPI Tina Hampton presents for follow-up of OCD, anxiety, depression, and insomnia.  Describes mood today as "shaky". Pleasant. Mood symptoms - reports depression, anxiety, and irritability at times. Feels more anxious overall. Feels less anxious when lying down. Stating "I'm not doing that good" - "I feel nervous and shaky". Got nervous about her taxes - "I can't do them anymore". Does not have the interest she once had. Feels a little better towards the end of the day. Likes to watch TV. Talking with therapist regularly - [facility]. Stating "I'm trying to use the techniques she tells me to". Saw Optometrist recently - macular  degeneration. Taking medications as prescribed.  Energy levels varies. Active, exercises twice a week. Enjoys some usual interests and activities. Widowed. Lives at [facility]. Talking with family and friends. Getting out of room most days.  Appetite adequate. Weight stable - "trying to make herself eat - food not that great here". Sleeps well most nights. Averages 8 hours. Waking up during the night some nights.  Focus and concentration stable. Managing own medications.Completing tasks.Taking medications independently. Denies SI or HI. Denies AH or VH.  Multiple medication trials and failures: Latuda, Abilify, Rexulti, Effexor, Prozac, Buspar, Lamictal, Lexapro, Trintellix, Lorazepam, Luvox, Lorazepam.  Review of Systems:  Review of Systems  Musculoskeletal: Negative for gait problem.  Neurological: Negative for tremors.  Psychiatric/Behavioral:       Please refer to HPI    Medications: I have reviewed the patient's current medications.  Current Outpatient Medications  Medication Sig Dispense Refill  . acetaminophen-codeine (TYLENOL #3) 300-30 MG tablet Take 1 tablet by mouth every 6 (six) hours as needed for moderate pain.    . Acetylcysteine 600 MG CAPS Take 1 capsule by mouth daily.    Marland Kitchen antiseptic oral rinse (BIOTENE) LIQD 15 mLs by Mouth Rinse route every 4 (four) hours as needed for dry mouth. Resident may self administer and keep in room    . aspirin 81 MG chewable tablet Chew 81 mg by mouth daily.    . Bismuth Subsalicylate (PEPTO-BISMOL PO) Take 30 mg by mouth as needed. For diarrhea    . buPROPion (WELLBUTRIN XL) 300 MG 24 hr tablet Take 1 tablet (300 mg total) by mouth daily. Sargeant  tablet 5  . Calcium Carb-Cholecalciferol 600-800 MG-UNIT TABS Take 1 tablet by mouth 2 (two) times daily.    . chlorpheniramine-HYDROcodone (TUSSIONEX) 10-8 MG/5ML SUER Take 5 mLs by mouth. Give 5 ml q12H PRN cough, Resident may self administer and keep in room    . cholecalciferol (VITAMIN D)  1000 UNITS tablet Take 1,000 Units by mouth daily.    . clonazePAM (KLONOPIN) 0.5 MG tablet Take 0.25 mg by mouth 4 (four) times daily as needed for anxiety.    . docusate sodium (COLACE) 100 MG capsule Take 1 capsule (100 mg total) by mouth daily. 30 capsule 0  . econazole nitrate 1 % cream Apply topically. apply once daily until till resolved, may self administer, may keep at bedside    . escitalopram (LEXAPRO) 20 MG tablet Take 1 tablet (20 mg total) by mouth daily. 30 tablet 5  . Eyelid Cleansers (OCUSOFT EYELID CLEANSING) PADS Apply 1 application topically daily.    . Glucosamine HCl 1000 MG TABS Take 2,000 mg by mouth 2 (two) times daily. 2 tabs    . LORazepam (ATIVAN) 0.5 MG tablet Take 1 tablet (0.5 mg total) by mouth 3 (three) times daily as needed for anxiety. 30 tablet 0  . meloxicam (MOBIC) 7.5 MG tablet TAKE 1 TABLET BY MOUTH EVERY DAY AS NEEDED FOR ARTHRITIS 90 tablet 1  . Menthol, Topical Analgesic, 4 % GEL Apply 1 application topically as needed. Resident may self administer and keep in room    . miconazole (MICOTIN) 2 % powder Apply topically as needed for itching. apply to legs and feet once a day    . mirtazapine (REMERON) 15 MG tablet Take 30 mg by mouth at bedtime.     . Multiple Vitamin (MULTIVITAMIN WITH MINERALS) TABS Take 1 tablet by mouth daily.    . Multiple Vitamins-Minerals (PRESERVISION AREDS 2+MULTI VIT PO) Take 1 tablet by mouth 2 (two) times daily.    Marland Kitchen NIFEdipine (PROCARDIA XL/ADALAT-CC) 60 MG 24 hr tablet TAKE 1 TABLET BY MOUTH EVERY DAY TO CONTROL BLOOD PRESSURE 90 tablet 3  . pantoprazole (PROTONIX) 40 MG tablet One daily to reduce stomach acid 90 tablet 3  . Polyethyl Glycol-Propyl Glycol (SYSTANE) 0.4-0.3 % GEL ophthalmic gel Place 1 application into both eyes daily as needed. Special Instructions: to both eyes any time resident request; continue scheduled order As Needed    . Polyethyl Glycol-Propyl Glycol (SYSTANE) 0.4-0.3 % GEL ophthalmic gel Place 1  application into both eyes in the morning, at noon, in the evening, and at bedtime.    . polyethylene glycol (MIRALAX / GLYCOLAX) packet Take 17 g by mouth daily.     . pravastatin (PRAVACHOL) 10 MG tablet Take 10 mg by mouth daily.     No current facility-administered medications for this visit.    Medication Side Effects: None  Allergies:  Allergies  Allergen Reactions  . Lamictal [Lamotrigine] Swelling    Lip swelling  . Penicillins Other (See Comments)    CHILDHOOD REACTION  . Abilify [Aripiprazole] Rash    Past Medical History:  Diagnosis Date  . Abnormal mammogram, unspecified 04/19/2011  . Allergy   . Anxiety   . Backache, unspecified 01/21/2005  . Cervicalgia 08/20/2013  . Chronic kidney disease, stage II (mild) 04/03/2009  . Chronic rhinitis 09/28/2010  . DDD (degenerative disc disease)   . Depression   . Dermatophytosis of nail 01/21/2005  . Diverticulosis of colon (without mention of hemorrhage) 06/17/2005  . Edema 11/26/2004  . GERD (  gastroesophageal reflux disease)   . Hiatal hernia   . High cholesterol   . Hyperglycemia 11/18/2014  . Hypertension   . Insomnia, unspecified 10/18/2011  . Left inguinal hernia 04/24/2014  . Nocturia 09/19/2008  . Osteoporosis, senile 11/18/2014  . Other abnormal blood chemistry 04/03/2007  . Pain in joint, pelvic region and thigh 11/12/2011  . Pain in joint, shoulder region 08/15/2006  . Pain in limb 08/26/2005  . Palpitations 09/29/2009  . PAOD (peripheral arterial occlusive disease) ([place]) 11/18/2014   Right leg; diminished popliteal, dorsalis pedis, and posterior tibial artery pulsations   . Postmenopausal atrophic vaginitis 12/14/2004  . Scoliosis   . Senile osteoporosis 12/14/2004  . Tension headache 10/04/2005  . Unspecified cataract 11/26/2004  . Unspecified constipation 01/21/2005  . Unspecified urinary incontinence 11/26/2004  . Vaginal stenosis 11/18/2014   Tight band about 1/2 inches into the vagina which will not allow passage of my index  finger.     Family History  Problem Relation Age of Onset  . Heart disease Mother   . Stroke Mother   . Emphysema Father   . Arthritis Father   . Alcohol abuse Brother   . Heart disease Son   . Leukemia Son   . Esophageal cancer Neg Hx   . Stomach cancer Neg Hx   . Colon cancer Neg Hx     Social History   Socioeconomic History  . Marital status: Widowed    Spouse name: Not on file  . Number of children: Not on file  . Years of education: Not on file  . Highest education level: Not on file  Occupational History  . 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 Smoking status: Never Smoker  . Smokeless tobacco: Never Used  Vaping Use  . Vaping Use: Never used  Substance and Sexual Activity  . Alcohol use: Not Currently    Alcohol/week: 0.0 standard drinks    Comment: one glass of wine in evening  . Drug use: No  . Sexual activity: Never  Other Topics Concern  . Not on file  Social History Narrative   Lives at [facility] since 04/14/2005   Widowed (husband expired 2014)   Has Living Will, POA, MOST   Exercise: water aerobic  5 times a week   Walks with walker   Never smoked   Drinks moderate amount of caffeinate beverages daily   Alcohol none      Social Determinants of Radio broadcast assistant Strain: Not on file  Food Insecurity: Not on file  Transportation Needs: Not on file  Physical Activity: Not on file  Stress: Not on file  Social Connections: Not on file  Intimate Partner Violence: Not on file    Past Medical History, Surgical history, Social history, and Family history were reviewed and updated as appropriate.   Please see review of systems for further details on the patient's review from today.   Objective:   Physical Exam:  There were no vitals taken for this visit.  Physical Exam Constitutional:      General: She is not in acute distress. Musculoskeletal:        General: No deformity.  Neurological:      Mental Status: She is alert and oriented to person, place, and time.     Coordination: Coordination normal.  Psychiatric:        Attention and Perception: Attention and perception normal. She does not perceive auditory or  visual hallucinations.        Mood and Affect: Mood normal. Mood is not anxious or depressed. Affect is not labile, blunt, angry or inappropriate.        Speech: Speech normal.        Behavior: Behavior normal.        Thought Content: Thought content normal. Thought content is not paranoid or delusional. Thought content does not include homicidal or suicidal ideation. Thought content does not include homicidal or suicidal plan.        Cognition and Memory: Cognition and memory normal.        Judgment: Judgment normal.     Comments: Insight intact     Lab Review:     Component Value Date/Time   NA 140 08/11/2020 0000   NA 143 07/10/2017 0000   K 4.5 08/11/2020 0000   K 4.2 07/10/2017 0000   CL 103 08/11/2020 0000   CL 109 07/10/2017 0000   CO2 24 (A) 08/11/2020 0000   CO2 26 07/10/2017 0000   GLUCOSE 86 01/18/2018 0831   BUN 22 (A) 08/11/2020 0000   CREATININE 0.9 08/11/2020 0000   CREATININE 0.93 (H) 01/18/2018 0831   CALCIUM 9.9 08/11/2020 0000   CALCIUM 9.3 07/10/2017 0000   CALCIUM 9.3 07/10/2017 0000   PROT 6.5 01/18/2018 0831   PROT 5.8 07/10/2017 0000   PROT 5.8 07/10/2017 0000   ALBUMIN 4.1 08/11/2020 0000   ALBUMIN 3.5 07/10/2017 0000   ALBUMIN 3.5 07/10/2017 0000   AST 19 08/11/2020 0000   AST 18 07/10/2017 0000   ALT 12 08/11/2020 0000   ALT 13 07/10/2017 0000   ALKPHOS 63 08/11/2020 0000   ALKPHOS 60 07/10/2017 0000   BILITOT 0.3 01/18/2018 0831   BILITOT 0.2 07/10/2017 0000   GFRNONAA 59 11/14/2019 0000   GFRNONAA 54 (L) 01/18/2018 0831   GFRAA 69 11/14/2019 0000   GFRAA 63 01/18/2018 0831       Component Value Date/Time   WBC 6.6 08/11/2020 0000   WBC 5.1 08/04/2017 0000   RBC 4.36 08/11/2020 0000   HGB 12.9 08/11/2020 0000    HGB 11.6 07/10/2017 0000   HCT 40 08/11/2020 0000   HCT 34 07/10/2017 0000   PLT 219 08/11/2020 0000   MCV 88.4 08/04/2017 0000   MCV 92.3 04/20/2014 1530   MCH 30.2 08/04/2017 0000   MCHC 34.1 08/04/2017 0000   RDW 12.2 08/04/2017 0000   RDW 13.0 08/28/2013 0945   LYMPHSABS 1,363 02/20/2017 0750   LYMPHSABS 1.3 08/28/2013 0945   MONOABS 528 10/17/2016 1512   EOSABS 141 02/20/2017 0750   EOSABS 0.3 08/28/2013 0945   BASOSABS 32 02/20/2017 0750   BASOSABS 0.0 08/28/2013 0945    No results found for: POCLITH, LITHIUM   No results found for: PHENYTOIN, PHENOBARB, VALPROATE, CBMZ   .res Assessment: Plan:    Plan:  1. Wellbutrin XL 300mg  daily 2. Lexapro 20mg   3. Lorazepam 0.5mg  TID as needed for anxiety. May take one additional tablet as needed. 4. Remeron 30mg  at bedtime  Continue therapy [facility] [place] [facility]  RTC 4/6 weeks  Patient advised to contact office with any questions, adverse effects, or acute worsening in signs and symptoms.  Discussed potential benefits, risk, and side effects of benzodiazepines to include potential risk of tolerance and dependence, as well as possible drowsiness.  Advised patient not to drive if experiencing drowsiness and to take lowest possible effective dose to minimize risk of dependence and tolerance  Diagnoses and all orders for this visit:  Insomnia, unspecified type  Major depressive disorder, recurrent episode, moderate (HCC)  Obsessive-compulsive disorder, unspecified type  Generalized anxiety disorder    Please see After Visit Summary for patient specific instructions.  No future appointments.  No orders of the defined types were placed in this encounter.     -------------------------------

## 2020-09-01 ENCOUNTER — Encounter: Payer: Self-pay | Admitting: Nurse Practitioner

## 2020-09-01 ENCOUNTER — Non-Acute Institutional Stay: Payer: Medicare Other | Admitting: Nurse Practitioner

## 2020-09-01 DIAGNOSIS — E785 Hyperlipidemia, unspecified: Secondary | ICD-10-CM | POA: Diagnosis not present

## 2020-09-01 DIAGNOSIS — M159 Polyosteoarthritis, unspecified: Secondary | ICD-10-CM

## 2020-09-01 DIAGNOSIS — R42 Dizziness and giddiness: Secondary | ICD-10-CM

## 2020-09-01 DIAGNOSIS — M8949 Other hypertrophic osteoarthropathy, multiple sites: Secondary | ICD-10-CM

## 2020-09-01 DIAGNOSIS — K219 Gastro-esophageal reflux disease without esophagitis: Secondary | ICD-10-CM | POA: Diagnosis not present

## 2020-09-01 DIAGNOSIS — N939 Abnormal uterine and vaginal bleeding, unspecified: Secondary | ICD-10-CM | POA: Diagnosis not present

## 2020-09-01 DIAGNOSIS — I10 Essential (primary) hypertension: Secondary | ICD-10-CM

## 2020-09-01 DIAGNOSIS — F339 Major depressive disorder, recurrent, unspecified: Secondary | ICD-10-CM

## 2020-09-01 DIAGNOSIS — F411 Generalized anxiety disorder: Secondary | ICD-10-CM | POA: Diagnosis not present

## 2020-09-01 DIAGNOSIS — K5900 Constipation, unspecified: Secondary | ICD-10-CM | POA: Diagnosis not present

## 2020-09-01 NOTE — Assessment & Plan Note (Signed)
Anxiety see Psych, Lorazepam, TSH 2.0u 11/14/19

## 2020-09-01 NOTE — Assessment & Plan Note (Signed)
Lower back pian, uses prn Tramadol, Tylenol,  ambulates with walker.

## 2020-09-01 NOTE — Progress Notes (Addendum)
Location:   [place] Room Number: [place] of Service:  ALF [phone]) Provider: Marlana Latus NP    Patient Care Team: Virgie Dad, MD as PCP - General (Internal Medicine) Melina Modena, [facility] Clent Jacks, MD as Consulting Physician (Ophthalmology) Milus Banister, MD as Consulting Physician (Gastroenterology) Norma Fredrickson, MD as Consulting Physician (Psychiatry) Jessy Oto, MD as Consulting Physician (Orthopedic Surgery) Bjorn Loser, MD as Consulting Physician (Urology)  Extended Emergency Contact Information Primary Emergency Contact: [address] [place], [place] [address] Johnnette Litter of [place] Phone: [phone] Mobile Phone: [phone] Relation: Son  Code Status:  DNR Goals of care: Advanced Directive information Advanced Directives 09/01/2020  Does Patient Have a Medical Advance Directive? Yes  Type of Paramedic of [place];Living will  Does patient want to make changes to medical advance directive? No - Patient declined  Copy of [place] in Chart? Yes - validated most recent copy scanned in chart (See row information)  Would patient like information on creating a medical advance directive? -  Pre-existing out of facility DNR order (yellow form or pink MOST form) -     Chief Complaint  Patient presents with  . Acute Visit    Vaginal Bleeding.    HPI:  Pt is a 87 y.o. female seen today for an acute visit for vaginal bleed last night reported by the patient, not new, but seems brighter and larger amount than usual. Hx of vaginal bleeding/spoting on and off since the prolapsed vagina repair due to the surgical scar tissue. Denied new dysuria, urinary frequency, or urinary urgency. Denied abd pain, nausea, vomiting, constipation, or diarrhea. Declined vaginal exam during my examination, declined GYN f/u.    GERD, stable, on Pantoprazole 40mg  qd, Hgb 12.9  08/11/20 Constipation, stable, on Colace qd, MiraLax qd.              Lower back pian, uses prn Tramadol, Tylenol,  ambulates with walker.  HTN, blood pressure is controlled, on Nifedipine 60mg  qd, ASA 81mg  qd, Bun/creat 22/0.9 08/11/20             Anxiety see Psych, Lorazepam, TSH 2.0u 11/14/19             Depression: takes Wellbutrin, Mirtazapine, Lexapro.              Dizziness, on and off, chronic, ? Anxiety, MRI brain-Patient declined it.   Hyperlipidemia, LDL 86 08/11/20, takes Pravastatin.    Past Medical History:  Diagnosis Date  . Abnormal mammogram, unspecified 04/19/2011  . Allergy   . Anxiety   . Backache, unspecified 01/21/2005  . Cervicalgia 08/20/2013  . Chronic kidney disease, stage II (mild) 04/03/2009  . Chronic rhinitis 09/28/2010  . DDD (degenerative disc disease)   . Depression   . Dermatophytosis of nail 01/21/2005  . Diverticulosis of colon (without mention of hemorrhage) 06/17/2005  . Edema 11/26/2004  . GERD (gastroesophageal reflux disease)   . Hiatal hernia   . High cholesterol   . Hyperglycemia 11/18/2014  . Hypertension   . Insomnia, unspecified 10/18/2011  . Left inguinal hernia 04/24/2014  . Nocturia 09/19/2008  . Osteoporosis, senile 11/18/2014  . Other abnormal blood chemistry 04/03/2007  . Pain in joint, pelvic region and thigh 11/12/2011  . Pain in joint, shoulder region 08/15/2006  . Pain in limb 08/26/2005  . Palpitations 09/29/2009  . PAOD (peripheral arterial occlusive disease) ([place]) 11/18/2014  Right leg; diminished popliteal, dorsalis pedis, and posterior tibial artery pulsations   . Postmenopausal atrophic vaginitis 12/14/2004  . Scoliosis   . Senile osteoporosis 12/14/2004  . Tension headache 10/04/2005  . Unspecified cataract 11/26/2004  . Unspecified constipation 01/21/2005  . Unspecified urinary incontinence 11/26/2004  . Vaginal stenosis 11/18/2014   Tight band about 1/2 inches into the vagina which will not allow passage of my index  finger.    Past Surgical History:  Procedure Laterality Date  . ABDOMINAL HYSTERECTOMY  1969   Dr.Braun   . bilateral eyelid surgery  2005   Dr.Scott  . BILATERAL OOPHORECTOMY  2002  . BREAST SURGERY     benign tumor removal, left breast 1969  . CATARACT EXTRACTION     Right  . CHOLECYSTECTOMY  1990   Dr.Moore   . COLONOSCOPY  3/18/20008   inflammation, diverticular associated colitis -Dr. Ardis Hughs  . COSMETIC SURGERY    . ESOPHAGOGASTRODUODENOSCOPY N/A 01/24/2014   Procedure: ESOPHAGOGASTRODUODENOSCOPY (EGD);  Surgeon: Irene Shipper, MD;  Location[facility];  Service: Endoscopy;  Laterality: N/A;  . ESOPHAGOGASTRODUODENOSCOPY ENDOSCOPY  04/04/14   Dr. Ardis Hughs  . EYE SURGERY    . FOREIGN BODY REMOVAL N/A 01/24/2014   Procedure: FOREIGN BODY REMOVAL;  Surgeon: Irene Shipper, MD;  Location: [facility];  Service: Endoscopy;  Laterality: N/A;  . HIP PINNING,CANNULATED  11/07/2011   Procedure: CANNULATED HIP PINNING;  Surgeon: Jessy Oto, MD;  Location: [facility];  Service: Orthopedics;  Laterality: Left;  . ORIF FEMORAL NECK FRACTURE W/ [facility]  11/07/2011   Dr.Nitka   . TONSILLECTOMY     Dr.Joe Tamala Julian  . TUBAL LIGATION    . VAGINAL PROLAPSE REPAIR  2002   Dr.Horback     Allergies  Allergen Reactions  . Lamictal [Lamotrigine] Swelling    Lip swelling  . Penicillins Other (See Comments)    CHILDHOOD REACTION  . Abilify [Aripiprazole] Rash    Allergies as of 09/01/2020      Reactions   Lamictal [lamotrigine] Swelling   Lip swelling   Penicillins Other (See Comments)   CHILDHOOD REACTION   Abilify [aripiprazole] Rash      Medication List       Accurate as of September 01, 2020 11:59 PM. If you have any questions, ask your nurse or doctor.        acetaminophen-codeine 300-30 MG tablet Commonly known as: TYLENOL #3 Take 1 tablet by mouth every 6 (six) hours as needed for moderate pain.   Acetylcysteine 600 MG Caps Take 1 capsule by mouth daily.   antiseptic oral rinse  Liqd 15 mLs by Mouth Rinse route every 4 (four) hours as needed for dry mouth. Resident may self administer and keep in room   aspirin 81 MG chewable tablet Chew 81 mg by mouth daily.   buPROPion 300 MG 24 hr tablet Commonly known as: WELLBUTRIN XL Take 1 tablet (300 mg total) by mouth daily.   Calcium Carb-Cholecalciferol 600-800 MG-UNIT Tabs Take 1 tablet by mouth 2 (two) times daily.   chlorpheniramine-HYDROcodone 10-8 MG/5ML Suer Commonly known as: TUSSIONEX Take 5 mLs by mouth. Give 5 ml q12H PRN cough, Resident may self administer and keep in room   cholecalciferol 1000 units tablet Commonly known as: VITAMIN D Take 1,000 Units by mouth daily.   clonazePAM 0.5 MG tablet Commonly known as: KLONOPIN Take 0.5 mg by mouth 4 (four) times daily as needed for anxiety.   docusate sodium 100 MG capsule Commonly  known as: Colace Take 1 capsule (100 mg total) by mouth daily.   econazole nitrate 1 % cream Apply topically. apply once daily until till resolved, may self administer, may keep at bedside   escitalopram 20 MG tablet Commonly known as: LEXAPRO Take 1 tablet (20 mg total) by mouth daily.   Glucosamine HCl 1000 MG Tabs Take 2,000 mg by mouth 2 (two) times daily. 2 tabs   LORazepam 0.5 MG tablet Commonly known as: ATIVAN Take 1 tablet (0.5 mg total) by mouth 3 (three) times daily as needed for anxiety.   meloxicam 7.5 MG tablet Commonly known as: MOBIC TAKE 1 TABLET BY MOUTH EVERY DAY AS NEEDED FOR ARTHRITIS   Menthol (Topical Analgesic) 4 % Gel Apply 1 application topically as needed. Resident may self administer and keep in room   miconazole 2 % powder Commonly known as: MICOTIN Apply topically as needed for itching. apply to legs and feet once a day   mirtazapine 15 MG tablet Commonly known as: REMERON Take 30 mg by mouth at bedtime.   multivitamin with minerals Tabs tablet Take 1 tablet by mouth daily.   NIFEdipine 60 MG 24 hr tablet Commonly known  as: PROCARDIA XL/NIFEDICAL XL TAKE 1 TABLET BY MOUTH EVERY DAY TO CONTROL BLOOD PRESSURE   OcuSoft Eyelid Cleansing Pads Apply 1 application topically daily.   pantoprazole 40 MG tablet Commonly known as: PROTONIX One daily to reduce stomach acid   PEPTO-BISMOL PO Take 30 mg by mouth as needed. For diarrhea   polyethylene glycol 17 g packet Commonly known as: MIRALAX / GLYCOLAX Take 17 g by mouth daily.   pravastatin 10 MG tablet Commonly known as: PRAVACHOL Take 10 mg by mouth daily.   PRESERVISION AREDS 2+MULTI VIT PO Take 1 tablet by mouth 2 (two) times daily.   Systane 0.4-0.3 % Gel ophthalmic gel Generic drug: Polyethyl Glycol-Propyl Glycol Place 1 application into both eyes daily as needed. Special Instructions: to both eyes any time resident request; continue scheduled order As Needed   Systane 0.4-0.3 % Gel ophthalmic gel Generic drug: Polyethyl Glycol-Propyl Glycol Place 1 application into both eyes in the morning, at noon, in the evening, and at bedtime.       Review of Systems  Constitutional: Negative for appetite change, fatigue and fever.  HENT: Positive for hearing loss. Negative for congestion and voice change.   Eyes: Negative for visual disturbance.  Respiratory: Negative for shortness of breath.   Cardiovascular: Positive for leg swelling. Negative for chest pain and palpitations.  Gastrointestinal: Negative for abdominal pain, blood in stool, constipation, nausea and vomiting.  Genitourinary: Positive for vaginal bleeding. Negative for difficulty urinating, dysuria, flank pain, genital sores, hematuria, pelvic pain, urgency, vaginal discharge and vaginal pain.       Incontinent of urine  Musculoskeletal: Positive for arthralgias, back pain and gait problem.  Skin: Negative for color change and pallor.  Neurological: Positive for tremors and numbness. Negative for speech difficulty, weakness, light-headedness and headaches.       Right finger resting  tremor mild, left fingers feels numb  Psychiatric/Behavioral: Positive for dysphoric mood. Negative for behavioral problems, hallucinations and sleep disturbance. The patient is nervous/anxious.        Worries, fears    Immunization History  Administered Date(s) Administered  . Influenza Whole 02/14/2012, 02/14/2013  . Influenza, High Dose Seasonal PF 03/14/2017, 02/26/2019  . Influenza-Unspecified 02/27/2014, 02/12/2015, 02/25/2016, 03/15/2017, 02/19/2018, 02/18/2020  . Moderna Sars-Covid-2 Vaccination 05/20/2019, 06/17/2019, 03/30/2020  . Pneumococcal  Conjugate-13 07/13/2015  . Pneumococcal Polysaccharide-23 05/16/1998  . Td 05/16/2004  . Zoster 05/16/2005  . Zoster Recombinat (Shingrix) 02/20/2018, 05/11/2018, 07/16/2018   Pertinent  Health Maintenance Due  Topic Date Due  . INFLUENZA VACCINE  12/14/2020  . DEXA SCAN  Completed  . PNA vac Low Risk Adult  Completed  . MAMMOGRAM  Discontinued   Fall Risk  04/09/2019 04/25/2018 04/05/2018 04/21/2017 10/17/2016  Falls in the past year? 0 0 0 No No  Comment Emmi Telephone Survey: data to providers prior to load - Emmi Telephone Survey: data to providers prior to load - -  Number falls in past yr: - 0 - - -  Injury with Fall? - 0 - - -   Functional Status Survey:    Vitals:   09/01/20 1536  BP: 136/66  Pulse: 81  Resp: (!) 22  Temp: (!) 97.5 F (36.4 C)  SpO2: 96%  Weight: 145 lb 6.4 oz (66 kg)  Height: 5\' 2"  (1.575 m)   Body mass index is 26.59 kg/m. Physical Exam Vitals and nursing note reviewed.  Constitutional:      Appearance: Normal appearance.  HENT:     Head: Normocephalic and atraumatic.     Mouth/Throat:     Mouth: Mucous membranes are moist.  Eyes:     Extraocular Movements: Extraocular movements intact.     Conjunctiva/sclera: Conjunctivae normal.     Pupils: Pupils are equal, round, and reactive to light.  Cardiovascular:     Rate and Rhythm: Normal rate and regular rhythm.     Heart sounds: Murmur  heard.    Pulmonary:     Breath sounds: No rales.  Abdominal:     General: Bowel sounds are normal.     Palpations: Abdomen is soft.     Tenderness: There is no abdominal tenderness. There is no right CVA tenderness, left CVA tenderness, guarding or rebound.  Musculoskeletal:     Cervical back: Normal range of motion and neck supple.     Right lower leg: Edema present.     Left lower leg: Edema present.     Comments: Trace edema BLE  Skin:    General: Skin is warm and dry.  Neurological:     General: No focal deficit present.     Mental Status: She is alert and oriented to person, place, and time. Mental status is at baseline.     Gait: Gait abnormal.  Psychiatric:     Comments: Anxious, repetitive.      Labs reviewed: Recent Labs    11/14/19 0000 03/09/20 0000 08/11/20 0000  NA 139 139 140  K 4.1 4.3 4.5  CL 103 103 103  CO2 29* 27* 24*  BUN 16 23* 22*  CREATININE 0.9 0.8 0.9  CALCIUM 10.0 10.0 9.9   Recent Labs    11/14/19 0000 03/09/20 0000 08/11/20 0000  AST 16 16 19   ALT 14 12 12   ALKPHOS 57 64 63  ALBUMIN 3.9 4.1 4.1   Recent Labs    11/14/19 0000 03/09/20 0000 08/11/20 0000  WBC 5.3 6.7 6.6  NEUTROABS 3,625  --   --   HGB 2.1* 12.8 12.9  HCT 36 39 40  PLT 211 240 219   Lab Results  Component Value Date   TSH 2.07 11/14/2019   Lab Results  Component Value Date   HGBA1C 5.5 09/28/2018   Lab Results  Component Value Date   CHOL 163 08/11/2020   HDL 54 08/11/2020  LDLCALC 86 08/11/2020   TRIG 124 08/11/2020   CHOLHDL 3.5 08/04/2017    Significant Diagnostic Results in last 30 days:  No results found.  Assessment/Plan Vaginal bleeding vaginal bleed last night reported by the patient, not new, but seems brighter and larger amount than usual. Hx of vaginal bleeding/spoting on and off since the prolapsed vagina repair due to the surgical scar tissue. Denied new dysuria, urinary frequency, or urinary urgency. Denied abd pain, nausea,  vomiting, constipation, or diarrhea. Declined vaginal exam during my examination, declined GYN f/u. [place] CT abd if the patient desires.  09/07/20 c/o vaginal bleed again, referral to GYN-the patient can refuse it.    GERD (gastroesophageal reflux disease) GERD, stable, on Pantoprazole 40mg  qd, Hgb 12.9 08/11/20   Constipation Constipation, stable, on Colace qd, MiraLax qd.    Primary osteoarthritis involving multiple joints Lower back pian, uses prn Tramadol, Tylenol,  ambulates with walker.   Hypertension  blood pressure is controlled, on Nifedipine 60mg  qd, ASA 81mg  qd, Bun/creat 22/0.9 08/11/20   GAD (generalized anxiety disorder) Anxiety see Psych, Lorazepam, TSH 2.0u 11/14/19   Major depression, recurrent, chronic (HCC) :ongoing anxiety, depression, but able to sleep, eat, function in [place] [facility],  takes Wellbutrin, Mirtazapine, Lexapro.              Dizziness Dizziness, on and off, chronic, ? Anxiety, MRI brain-Patient declined it.    Hyperlipidemia LDL goal <100  LDL 86 08/11/20, takes Pravastatin.      Family/ staff Communication: plan of care reviewed with the patient and charge nurse.   Labs/tests ordered:  None  Time spend 40 minutes.

## 2020-09-01 NOTE — Assessment & Plan Note (Signed)
blood pressure is controlled, on Nifedipine 60mg  qd, ASA 81mg  qd, Bun/creat 22/0.9 08/11/20

## 2020-09-01 NOTE — Assessment & Plan Note (Signed)
LDL 86 08/11/20, takes Pravastatin.

## 2020-09-01 NOTE — Assessment & Plan Note (Signed)
Constipation, stable, on Colace qd, MiraLax qd.

## 2020-09-01 NOTE — Assessment & Plan Note (Addendum)
vaginal bleed last night reported by the patient, not new, but seems brighter and larger amount than usual. Hx of vaginal bleeding/spoting on and off since the prolapsed vagina repair due to the surgical scar tissue. Denied new dysuria, urinary frequency, or urinary urgency. Denied abd pain, nausea, vomiting, constipation, or diarrhea. Declined vaginal exam during my examination, declined GYN f/u. [place] CT abd if the patient desires.  09/07/20 c/o vaginal bleed again, referral to GYN-the patient can refuse it.

## 2020-09-01 NOTE — Assessment & Plan Note (Signed)
:  ongoing anxiety, depression, but able to sleep, eat, function in [place] [facility],  takes Wellbutrin, Mirtazapine, Lexapro.

## 2020-09-01 NOTE — Assessment & Plan Note (Signed)
GERD, stable, on Pantoprazole 40mg  qd, Hgb 12.9 08/11/20

## 2020-09-01 NOTE — Assessment & Plan Note (Signed)
Dizziness, on and off, chronic, ? Anxiety, MRI brain-Patient declined it.

## 2020-09-02 ENCOUNTER — Encounter: Payer: Self-pay | Admitting: Nurse Practitioner

## 2020-09-04 DIAGNOSIS — B351 Tinea unguium: Secondary | ICD-10-CM | POA: Diagnosis not present

## 2020-09-04 DIAGNOSIS — M79671 Pain in right foot: Secondary | ICD-10-CM | POA: Diagnosis not present

## 2020-09-04 DIAGNOSIS — M79672 Pain in left foot: Secondary | ICD-10-CM | POA: Diagnosis not present

## 2020-09-16 DIAGNOSIS — R1312 Dysphagia, oropharyngeal phase: Secondary | ICD-10-CM | POA: Diagnosis not present

## 2020-09-22 DIAGNOSIS — R1312 Dysphagia, oropharyngeal phase: Secondary | ICD-10-CM | POA: Diagnosis not present

## 2020-09-23 DIAGNOSIS — R1312 Dysphagia, oropharyngeal phase: Secondary | ICD-10-CM | POA: Diagnosis not present

## 2020-09-25 DIAGNOSIS — R1312 Dysphagia, oropharyngeal phase: Secondary | ICD-10-CM | POA: Diagnosis not present

## 2020-09-29 DIAGNOSIS — R1312 Dysphagia, oropharyngeal phase: Secondary | ICD-10-CM | POA: Diagnosis not present

## 2020-09-30 ENCOUNTER — Telehealth: Payer: Self-pay | Admitting: Adult Health

## 2020-09-30 DIAGNOSIS — R1312 Dysphagia, oropharyngeal phase: Secondary | ICD-10-CM | POA: Diagnosis not present

## 2020-09-30 NOTE — Telephone Encounter (Signed)
Do you want me to call her?

## 2020-09-30 NOTE — Telephone Encounter (Signed)
Yes please

## 2020-09-30 NOTE — Telephone Encounter (Signed)
Pt left message that she is not doing well and would like a call back to discuss.

## 2020-10-01 DIAGNOSIS — F411 Generalized anxiety disorder: Secondary | ICD-10-CM | POA: Diagnosis not present

## 2020-10-01 NOTE — Telephone Encounter (Signed)
Rtc to pt and she knows there's probably nothing else to help with her anxiety and she's at her max dose but she reports she's so anxious and gets shaky at times. She did speak to her counselor Santiago Glad and she gave her some techniques to work on to help. She reports she can't stop thinking about "me" she says. Tried to encourage her to focus on other things, discussed getting off the phone to try and read. She said in the evenings there is some shows she watches so that helps.

## 2020-10-01 NOTE — Telephone Encounter (Signed)
Noted  

## 2020-10-02 DIAGNOSIS — R1312 Dysphagia, oropharyngeal phase: Secondary | ICD-10-CM | POA: Diagnosis not present

## 2020-10-06 DIAGNOSIS — R1312 Dysphagia, oropharyngeal phase: Secondary | ICD-10-CM | POA: Diagnosis not present

## 2020-10-08 DIAGNOSIS — F411 Generalized anxiety disorder: Secondary | ICD-10-CM | POA: Diagnosis not present

## 2020-10-09 DIAGNOSIS — R1312 Dysphagia, oropharyngeal phase: Secondary | ICD-10-CM | POA: Diagnosis not present

## 2020-10-21 DIAGNOSIS — Z23 Encounter for immunization: Secondary | ICD-10-CM | POA: Diagnosis not present

## 2020-10-22 DIAGNOSIS — F411 Generalized anxiety disorder: Secondary | ICD-10-CM | POA: Diagnosis not present

## 2020-10-27 ENCOUNTER — Other Ambulatory Visit: Payer: Self-pay | Admitting: *Deleted

## 2020-10-27 DIAGNOSIS — F411 Generalized anxiety disorder: Secondary | ICD-10-CM

## 2020-10-27 DIAGNOSIS — F429 Obsessive-compulsive disorder, unspecified: Secondary | ICD-10-CM

## 2020-10-27 MED ORDER — LORAZEPAM 0.5 MG PO TABS
0.5000 mg | ORAL_TABLET | Freq: Three times a day (TID) | ORAL | 0 refills | Status: DC | PRN
Start: 2020-10-27 — End: 2020-12-02

## 2020-10-27 NOTE — Telephone Encounter (Signed)
[facility] Requested refill Pended Rx and sent to [facility] for approval.

## 2020-11-05 ENCOUNTER — Telehealth: Payer: Self-pay | Admitting: *Deleted

## 2020-11-05 DIAGNOSIS — F411 Generalized anxiety disorder: Secondary | ICD-10-CM | POA: Diagnosis not present

## 2020-11-05 NOTE — Telephone Encounter (Signed)
[employer], Psych. Clinical Nurse called and stated that she has been seeing the patient for her anxiety in the brain/head. Stated that patient is complaining every 2 weeks it is getting worse and worse. Nurse is requesting to speak with you directly regarding patient. Wants you to call her at [phone]  2. Worth [place], Son, called right after the    nurse and stated that he and the nurse agree with patient's difficulties and discomfort around the eyes. Son is thinking a MRI needs to be done to check this. Stated that if you need to call him, please call [phone]

## 2020-11-06 ENCOUNTER — Other Ambulatory Visit: Payer: Self-pay | Admitting: Internal Medicine

## 2020-11-06 DIAGNOSIS — R42 Dizziness and giddiness: Secondary | ICD-10-CM

## 2020-11-06 NOTE — Telephone Encounter (Signed)
I talked to the son and Tye [place]. Patient continues to have non specific Symptoms of Lightheadedness and Dizziness. ? Inability to focus She has not been responding to many different antianxiety therapies. Son wants to know if there is something else we can do for her ? ? Imaging. I have recommended Neurology Referal to evaluate her Dizziness and Lightheadedness. He has agreed and will talk to her also Will Make a referal

## 2020-11-13 DIAGNOSIS — B351 Tinea unguium: Secondary | ICD-10-CM | POA: Diagnosis not present

## 2020-11-13 DIAGNOSIS — M79671 Pain in right foot: Secondary | ICD-10-CM | POA: Diagnosis not present

## 2020-11-13 DIAGNOSIS — M79672 Pain in left foot: Secondary | ICD-10-CM | POA: Diagnosis not present

## 2020-11-17 DIAGNOSIS — F411 Generalized anxiety disorder: Secondary | ICD-10-CM | POA: Diagnosis not present

## 2020-11-26 DIAGNOSIS — F411 Generalized anxiety disorder: Secondary | ICD-10-CM | POA: Diagnosis not present

## 2020-11-27 ENCOUNTER — Encounter: Payer: Self-pay | Admitting: Orthopedic Surgery

## 2020-11-27 ENCOUNTER — Non-Acute Institutional Stay: Payer: Medicare Other | Admitting: Orthopedic Surgery

## 2020-11-27 DIAGNOSIS — R42 Dizziness and giddiness: Secondary | ICD-10-CM

## 2020-11-27 DIAGNOSIS — F411 Generalized anxiety disorder: Secondary | ICD-10-CM

## 2020-11-27 NOTE — Progress Notes (Signed)
Location:   [place] Room Number: [place] of Service:  ALF [phone]) Provider:  Windell Moulding, NP    Patient Care Team: Virgie Dad, MD as PCP - General (Internal Medicine) Melina Modena, [facility] Clent Jacks, MD as Consulting Physician (Ophthalmology) Milus Banister, MD as Consulting Physician (Gastroenterology) Norma Fredrickson, MD as Consulting Physician (Psychiatry) Jessy Oto, MD as Consulting Physician (Orthopedic Surgery) Bjorn Loser, MD as Consulting Physician (Urology)  Extended Emergency Contact Information Primary Emergency Contact: Betha, [address] [place], [place] [address] Johnnette Litter of [place] Phone: [phone] Mobile Phone: [phone] Relation: Son  Code Status:  DNR Goals of care: Advanced Directive information Advanced Directives 11/27/2020  Does Patient Have a Medical Advance Directive? Yes  Type of Paramedic of [place];Living will;Out of facility DNR (pink MOST or yellow form)  Does patient want to make changes to medical advance directive? No - Patient declined  Copy of [place] in Chart? Yes - validated most recent copy scanned in chart (See row information)  Would patient like information on creating a medical advance directive? -  Pre-existing out of facility DNR order (yellow form or pink MOST form) Pink MOST form placed in chart (order not valid for inpatient use)     Chief Complaint  Patient presents with   Acute Visit    Patient complains of dizziness    HPI:  Pt is a 87 y.o. female seen today for an acute visit for increased dizziness.   She currently resides on the assisted living unit at [facility]. Past medical history includes: HTN, PAOD, dysphagia, GERD, DDD, OA of multiple joints, OAB, GAD, and major depression.   Today she reports increased dizziness and anxiety. Neuro referral made last month. She is scheduled to see them 12/23/2020 at  10 am. She reports feeling dizzy at rest. Denies the room is spinning around her. No recent falls or injuries. Continues to ambulate with walker.   Recent dizziness has also increased her anxiety. She reports increased anxiety when she is alone in her room. Continues to see "Juliann Pulse" her counselor and work on techniques to reduce symptoms. Working on "distraction" techniques like watching television and reading to help. Remains on klonopin, ativan, Lexapro and Wellbutrin daily for anxiety. She denies recent panic attacks.   Nurse does not report any other concerns, vitals stable.    Past Medical History:  Diagnosis Date   Abnormal mammogram, unspecified 04/19/2011   Allergy    Anxiety    Backache, unspecified 01/21/2005   Cervicalgia 08/20/2013   Chronic kidney disease, stage II (mild) 04/03/2009   Chronic rhinitis 09/28/2010   DDD (degenerative disc disease)    Depression    Dermatophytosis of nail 01/21/2005   Diverticulosis of colon (without mention of hemorrhage) 06/17/2005   Edema 11/26/2004   GERD (gastroesophageal reflux disease)    Hiatal hernia    High cholesterol    Hyperglycemia 11/18/2014   Hypertension    Insomnia, unspecified 10/18/2011   Left inguinal hernia 04/24/2014   Nocturia 09/19/2008   Osteoporosis, senile 11/18/2014   Other abnormal blood chemistry 04/03/2007   Pain in joint, pelvic region and thigh 11/12/2011   Pain in joint, shoulder region 08/15/2006   Pain in limb 08/26/2005   Palpitations 09/29/2009   PAOD (peripheral arterial occlusive disease) ([place]) 11/18/2014   Right leg; diminished popliteal, dorsalis pedis, and posterior tibial artery pulsations  Postmenopausal atrophic vaginitis 12/14/2004   Scoliosis    Senile osteoporosis 12/14/2004   Tension headache 10/04/2005   Unspecified cataract 11/26/2004   Unspecified constipation 01/21/2005   Unspecified urinary incontinence 11/26/2004   Vaginal stenosis 11/18/2014   Tight band about 1/2 inches into the vagina which will not allow  passage of my index finger.    Past Surgical History:  Procedure Laterality Date   ABDOMINAL HYSTERECTOMY  1969   Dr.Braun    bilateral eyelid surgery  2005   Dr.Scott   BILATERAL OOPHORECTOMY  2002   BREAST SURGERY     benign tumor removal, left breast 1969   CATARACT EXTRACTION     Right   CHOLECYSTECTOMY  1990   Dr.Moore    COLONOSCOPY  3/18/20008   inflammation, diverticular associated colitis -Dr. Ardis Hughs   COSMETIC SURGERY     ESOPHAGOGASTRODUODENOSCOPY N/A 01/24/2014   Procedure: ESOPHAGOGASTRODUODENOSCOPY (EGD);  Surgeon: Irene Shipper, MD;  Location: [facility] ENDOSCOPY;  Service: Endoscopy;  Laterality: N/A;   ESOPHAGOGASTRODUODENOSCOPY ENDOSCOPY  04/04/14   Dr. Ardis Hughs   EYE SURGERY     FOREIGN BODY REMOVAL N/A 01/24/2014   Procedure: FOREIGN BODY REMOVAL;  Surgeon: Irene Shipper, MD;  Location: [place];  Service: Endoscopy;  Laterality: N/A;   HIP PINNING,CANNULATED  11/07/2011   Procedure: CANNULATED HIP PINNING;  Surgeon: Jessy Oto, MD;  Location: [facility] ORS;  Service: Orthopedics;  Laterality: Left;   ORIF FEMORAL NECK FRACTURE W/ DHS  11/07/2011   Dr.Nitka    TONSILLECTOMY     Dr.Joe Tamala Julian   TUBAL LIGATION     VAGINAL PROLAPSE REPAIR  2002   Dr.Horback     Allergies  Allergen Reactions   Lamictal [Lamotrigine] Swelling    Lip swelling   Penicillins Other (See Comments)    CHILDHOOD REACTION   Abilify [Aripiprazole] Rash    Allergies as of 11/27/2020       Reactions   Lamictal [lamotrigine] Swelling   Lip swelling   Penicillins Other (See Comments)   CHILDHOOD REACTION   Abilify [aripiprazole] Rash        Medication List        Accurate as of November 27, 2020  3:50 PM. If you have any questions, ask your nurse or doctor.          acetaminophen-codeine 300-30 MG tablet Commonly known as: TYLENOL #3 Take 1 tablet by mouth every 6 (six) hours as needed for moderate pain.   Acetylcysteine 600 MG Caps Take 1 capsule by mouth daily.   antiseptic  oral rinse Liqd 15 mLs by Mouth Rinse route every 4 (four) hours as needed for dry mouth. Resident may self administer and keep in room   aspirin 81 MG chewable tablet Chew 81 mg by mouth daily.   buPROPion 300 MG 24 hr tablet Commonly known as: WELLBUTRIN XL Take 1 tablet (300 mg total) by mouth daily.   Calcium Carb-Cholecalciferol 600-800 MG-UNIT Tabs Take 1 tablet by mouth 2 (two) times daily.   chlorpheniramine-HYDROcodone 10-8 MG/5ML Suer Commonly known as: TUSSIONEX Take 5 mLs by mouth. Give 5 ml q12H PRN cough, Resident may self administer and keep in room   cholecalciferol 1000 units tablet Commonly known as: VITAMIN D Take 1,000 Units by mouth daily.   clonazePAM 0.5 MG tablet Commonly known as: KLONOPIN Take 0.5 mg by mouth 4 (four) times daily as needed for anxiety.   docusate sodium 100 MG capsule Commonly known as: Colace Take 1 capsule (100  mg total) by mouth daily.   econazole nitrate 1 % cream Apply topically. apply once daily until till resolved, may self administer, may keep at bedside   escitalopram 20 MG tablet Commonly known as: LEXAPRO Take 1 tablet (20 mg total) by mouth daily.   Glucosamine HCl 1000 MG Tabs Take 2,000 mg by mouth 2 (two) times daily. 2 tabs   LORazepam 0.5 MG tablet Commonly known as: ATIVAN Take 1 tablet (0.5 mg total) by mouth 3 (three) times daily as needed for anxiety.   meloxicam 7.5 MG tablet Commonly known as: MOBIC TAKE 1 TABLET BY MOUTH EVERY DAY AS NEEDED FOR ARTHRITIS   Menthol (Topical Analgesic) 4 % Gel Apply 1 application topically as needed. Resident may self administer and keep in room   miconazole 2 % powder Commonly known as: MICOTIN Apply topically as needed for itching. apply to legs and feet once a day   mirtazapine 15 MG tablet Commonly known as: REMERON Take 30 mg by mouth at bedtime.   multivitamin with minerals Tabs tablet Take 1 tablet by mouth daily.   NIFEdipine 60 MG 24 hr  tablet Commonly known as: PROCARDIA XL/NIFEDICAL XL TAKE 1 TABLET BY MOUTH EVERY DAY TO CONTROL BLOOD PRESSURE   OcuSoft Eyelid Cleansing Pads Apply 1 application topically daily.   pantoprazole 40 MG tablet Commonly known as: PROTONIX One daily to reduce stomach acid   PEPTO-BISMOL PO Take 30 mg by mouth as needed. For diarrhea   polyethylene glycol 17 g packet Commonly known as: MIRALAX / GLYCOLAX Take 17 g by mouth daily.   pravastatin 10 MG tablet Commonly known as: PRAVACHOL Take 10 mg by mouth daily.   PRESERVISION AREDS 2+MULTI VIT PO Take 1 tablet by mouth 2 (two) times daily.   Systane 0.4-0.3 % Gel ophthalmic gel Generic drug: Polyethyl Glycol-Propyl Glycol Place 1 application into both eyes daily as needed. Special Instructions: to both eyes any time resident request; continue scheduled order As Needed   Systane 0.4-0.3 % Gel ophthalmic gel Generic drug: Polyethyl Glycol-Propyl Glycol Place 1 application into both eyes in the morning, at noon, in the evening, and at bedtime.        Review of Systems  Constitutional:  Negative for activity change, appetite change, fatigue and fever.  Respiratory:  Negative for cough, shortness of breath and wheezing.   Cardiovascular:  Negative for chest pain and leg swelling.  Neurological:  Positive for dizziness and weakness. Negative for light-headedness.  Psychiatric/Behavioral:  Positive for dysphoric mood. Negative for confusion. The patient is nervous/anxious.    Immunization History  Administered Date(s) Administered   Influenza Whole 02/14/2012, 02/14/2013   Influenza, High Dose Seasonal PF 03/14/2017, 02/26/2019   Influenza-Unspecified 02/27/2014, 02/12/2015, 02/25/2016, 03/15/2017, 02/19/2018, 02/18/2020   Moderna SARS-COV2 Booster Vaccination 10/21/2020   Moderna Sars-Covid-2 Vaccination 05/20/2019, 06/17/2019, 03/30/2020   Pneumococcal Conjugate-13 07/13/2015   Pneumococcal Polysaccharide-23 05/16/1998    Td 05/16/2004   Zoster Recombinat (Shingrix) 02/20/2018, 05/11/2018, 07/16/2018   Zoster, Live 05/16/2005   Pertinent  Health Maintenance Due  Topic Date Due   INFLUENZA VACCINE  12/14/2020   DEXA SCAN  Completed   PNA vac Low Risk Adult  Completed   MAMMOGRAM  Discontinued   Fall Risk  04/09/2019 04/25/2018 04/05/2018 04/21/2017 10/17/2016  Falls in the past year? 0 0 0 No No  Comment Emmi Telephone Survey: data to providers prior to load - Emmi Telephone Survey: data to providers prior to load - -  Number falls in  past yr: - 0 - - -  Injury with Fall? - 0 - - -   Functional Status Survey:    Vitals:   11/27/20 1544  BP: 125/67  Pulse: 82  Resp: 18  Temp: (!) 96.9 F (36.1 C)  SpO2: 94%  Weight: 145 lb 3.2 oz (65.9 kg)  Height: 5\' 2"  (1.575 m)   Body mass index is 26.56 kg/m. Physical Exam Vitals reviewed.  Constitutional:      General: She is not in acute distress. HENT:     Head: Normocephalic.  Eyes:     Extraocular Movements:     Right eye: Normal extraocular motion and no nystagmus.     Left eye: Normal extraocular motion and no nystagmus.  Cardiovascular:     Rate and Rhythm: Normal rate and regular rhythm.     Pulses: Normal pulses.     Heart sounds: Normal heart sounds. No murmur heard. Pulmonary:     Effort: Pulmonary effort is normal. No respiratory distress.     Breath sounds: Normal breath sounds. No wheezing.  Musculoskeletal:     Right lower leg: No edema.     Left lower leg: No edema.  Skin:    General: Skin is warm and dry.     Capillary Refill: Capillary refill takes less than 2 seconds.  Neurological:     General: No focal deficit present.     Mental Status: She is alert and oriented to person, place, and time.     Cranial Nerves: Cranial nerves are intact.     Sensory: Sensation is intact.     Motor: Weakness present.     Gait: Gait abnormal.     Comments: walker  Psychiatric:        Attention and Perception: Attention normal.         Mood and Affect: Mood is anxious.    Labs reviewed: Recent Labs    03/09/20 0000 08/11/20 0000  NA 139 140  K 4.3 4.5  CL 103 103  CO2 27* 24*  BUN 23* 22*  CREATININE 0.8 0.9  CALCIUM 10.0 9.9   Recent Labs    03/09/20 0000 08/11/20 0000  AST 16 19  ALT 12 12  ALKPHOS 64 63  ALBUMIN 4.1 4.1   Recent Labs    03/09/20 0000 08/11/20 0000  WBC 6.7 6.6  HGB 12.8 12.9  HCT 39 40  PLT 240 219   Lab Results  Component Value Date   TSH 2.07 11/14/2019   Lab Results  Component Value Date   HGBA1C 5.5 09/28/2018   Lab Results  Component Value Date   CHOL 163 08/11/2020   HDL 54 08/11/2020   LDLCALC 86 08/11/2020   TRIG 124 08/11/2020   CHOLHDL 3.5 08/04/2017    Significant Diagnostic Results in last 30 days:  No results found.  Assessment/Plan 1. Dizziness - referral to see neuro made in June, scheduled 08/10 - blood pressures stable  -cbc/diff - cmp  2. GAD (generalized anxiety disorder) - ongoing, increased due to recent dizziness - she continues to see Juliann Pulse her counselor- next session 07/18 - cont klonopin, ativan, lexapro and wellbutrin - do not advise changing medication at this time - cont distraction techniques like watching television, reading and breathing     Family/ staff Communication: plan discussed with patient and nurse  Labs/tests ordered:  cbc/diff, cmp

## 2020-11-30 DIAGNOSIS — D649 Anemia, unspecified: Secondary | ICD-10-CM | POA: Diagnosis not present

## 2020-11-30 DIAGNOSIS — F0390 Unspecified dementia without behavioral disturbance: Secondary | ICD-10-CM | POA: Diagnosis not present

## 2020-12-01 LAB — COMPREHENSIVE METABOLIC PANEL
Albumin: 4 (ref 3.5–5.0)
Calcium: 9.7 (ref 8.7–10.7)
Globulin: 2.8

## 2020-12-01 LAB — HEPATIC FUNCTION PANEL
ALT: 12 (ref 7–35)
AST: 17 (ref 13–35)
Alkaline Phosphatase: 64 (ref 25–125)
Bilirubin, Total: 0.4

## 2020-12-01 LAB — CBC AND DIFFERENTIAL
HCT: 39 (ref 36–46)
Hemoglobin: 12.7 (ref 12.0–16.0)
Platelets: 202 (ref 150–399)
WBC: 5.2

## 2020-12-01 LAB — BASIC METABOLIC PANEL
BUN: 26 — AB (ref 4–21)
CO2: 27 — AB (ref 13–22)
Chloride: 101 (ref 99–108)
Creatinine: 1 (ref 0.5–1.1)
Glucose: 92
Potassium: 4.1 (ref 3.4–5.3)
Sodium: 138 (ref 137–147)

## 2020-12-01 LAB — CBC: RBC: 4.25 (ref 3.87–5.11)

## 2020-12-02 ENCOUNTER — Other Ambulatory Visit: Payer: Self-pay | Admitting: Adult Health

## 2020-12-02 DIAGNOSIS — F411 Generalized anxiety disorder: Secondary | ICD-10-CM

## 2020-12-02 DIAGNOSIS — F429 Obsessive-compulsive disorder, unspecified: Secondary | ICD-10-CM

## 2020-12-02 MED ORDER — LORAZEPAM 0.5 MG PO TABS: ORAL_TABLET | ORAL | 2 refills | Status: DC

## 2020-12-08 DIAGNOSIS — F411 Generalized anxiety disorder: Secondary | ICD-10-CM | POA: Diagnosis not present

## 2020-12-16 ENCOUNTER — Telehealth: Payer: Self-pay | Admitting: Adult Health

## 2020-12-16 ENCOUNTER — Encounter: Payer: Self-pay | Admitting: Adult Health

## 2020-12-16 NOTE — Telephone Encounter (Signed)
Look in the medication section - it was sent on 12/02/2020.

## 2020-12-16 NOTE — Telephone Encounter (Signed)
Please review

## 2020-12-16 NOTE — Telephone Encounter (Signed)
Pt called in for refill on Lorazepam 0.'5mg'$ . States that dosage was increase from 3 pills a day to 4 pills a day and she is about to run out due to taking more. She has about two or three left. Appt 8/19. Ph: [phone]. Pharmacy [facility].

## 2020-12-23 ENCOUNTER — Encounter: Payer: Self-pay | Admitting: Neurology

## 2020-12-23 ENCOUNTER — Other Ambulatory Visit: Payer: Self-pay

## 2020-12-23 ENCOUNTER — Ambulatory Visit (INDEPENDENT_AMBULATORY_CARE_PROVIDER_SITE_OTHER): Payer: Medicare Other | Admitting: Neurology

## 2020-12-23 VITALS — Wt 141.0 lb

## 2020-12-23 DIAGNOSIS — R42 Dizziness and giddiness: Secondary | ICD-10-CM | POA: Diagnosis not present

## 2020-12-23 DIAGNOSIS — F411 Generalized anxiety disorder: Secondary | ICD-10-CM | POA: Diagnosis not present

## 2020-12-23 NOTE — Patient Instructions (Signed)
Drink plenty of fluid.  Set up alarm/Reminder to help with drinking water. Continue your breathing your current medication at same dose. Return to clinic in 6 months or sooner if worse

## 2020-12-23 NOTE — Progress Notes (Signed)
[facility]  PATIENT: Tina Hampton DOB: April 18, 1926  REFERRING CLINICIAN: Virgie Dad, MD HISTORY FROM: Patient and son REASON FOR VISIT: Lightheadedness    HISTORICAL  CHIEF COMPLAINT:  Chief Complaint  Patient presents with   New Patient (Initial Visit)    Rm 56, with son, c/o dizziness, states it worse when she is moving, walking along with lightheaded, PCP states it may be anxiety related      HISTORY OF PRESENT ILLNESS:  This is a 87 year old woman with past medical history of generalized anxiety disorder, lightheadedness, GERD, had hyperlipidemia, who is presenting for evaluation of the lightheadedness.  Patient reported history of lightheadedness for the past 5 years described as feeling woozy in the head and uncomfortable around her eyes when going from a laying down position to a sitting position or when going from sitting position to a standing position.  Patient states for motor movement makes the lightheadedness worse like movement of the head or again going from a standing to a sitting position.  She has to use a walker otherwise she will she thinks she will fall.  Symptoms are getting worse in the past year.  She denies any room spinning sensation denies any hearing loss and denies any tinnitus.  When she remains still or when she lies down she does not feel the lightheadedness.  She also reported history of of frontal headaches described as slight discomfort, she is state that she use a cold compress normal is not suicidal but sometimes she will take Tylenol which seem to help with the pain.  She reports that her food intake is okay and she mentioned that she felt like she does not drink enough water, she reported she has to force myself to drink water. She denies any recent fall denies any recent weight loss.  She reported she lives in a nursing home.    OTHER MEDICAL CONDITIONS: GAD, MDD, GERD, Dizziness/Lightheadedness, Hyperlipidemia.     REVIEW OF SYSTEMS: Full 14 system review of systems performed and negative with exception of: as noted in the HPI  ALLERGIES: Allergies  Allergen Reactions   Brexpiprazole Swelling   Lamictal [Lamotrigine] Swelling    Lip swelling   Penicillins Other (See Comments)    CHILDHOOD REACTION   Abilify [Aripiprazole] Rash    HOME MEDICATIONS: Outpatient Medications Prior to Visit  Medication Sig Dispense Refill   acetaminophen-codeine (TYLENOL #3) 300-30 MG tablet Take 1 tablet by mouth every 6 (six) hours as needed for moderate pain.     Acetylcysteine 600 MG CAPS Take 1 capsule by mouth daily.     antiseptic oral rinse (BIOTENE) LIQD 15 mLs by Mouth Rinse route every 4 (four) hours as needed for dry mouth. Resident may self administer and keep in room     aspirin 81 MG chewable tablet Chew 81 mg by mouth daily.     Bismuth Subsalicylate (PEPTO-BISMOL PO) Take 30 mg by mouth as needed. For diarrhea     buPROPion (WELLBUTRIN XL) 300 MG 24 hr tablet Take 1 tablet (300 mg total) by mouth daily. 30 tablet 5   Calcium Carb-Cholecalciferol 600-800 MG-UNIT TABS Take 1 tablet by mouth 2 (two) times daily.     chlorpheniramine-HYDROcodone (TUSSIONEX) 10-8 MG/5ML SUER Take 5 mLs by mouth. Give 5 ml q12H PRN cough, Resident may self administer and keep in room     cholecalciferol (VITAMIN D) 1000 UNITS tablet Take 1,000 Units by mouth daily.     clonazePAM (KLONOPIN) 0.5 MG  tablet Take 0.5 mg by mouth 4 (four) times daily as needed for anxiety.     docusate sodium (COLACE) 100 MG capsule Take 1 capsule (100 mg total) by mouth daily. 30 capsule 0   econazole nitrate 1 % cream Apply topically. apply once daily until till resolved, may self administer, may keep at bedside     escitalopram (LEXAPRO) 20 MG tablet Take 1 tablet (20 mg total) by mouth daily. 30 tablet 5   Eyelid Cleansers (OCUSOFT EYELID CLEANSING) PADS Apply 1 application topically daily.     Glucosamine HCl 1000 MG TABS Take 2,000 mg  by mouth 2 (two) times daily. 2 tabs     LORazepam (ATIVAN) 0.5 MG tablet Take one tablet three times daily as needed for anxiety and take one extra tablet as needed for severe anxiety. 120 tablet 2   meloxicam (MOBIC) 7.5 MG tablet TAKE 1 TABLET BY MOUTH EVERY DAY AS NEEDED FOR ARTHRITIS 90 tablet 1   Menthol, Topical Analgesic, 4 % GEL Apply 1 application topically as needed. Resident may self administer and keep in room     miconazole (MICOTIN) 2 % powder Apply topically as needed for itching. apply to legs and feet once a day     mirtazapine (REMERON) 15 MG tablet Take 30 mg by mouth at bedtime.      Multiple Vitamin (MULTIVITAMIN WITH MINERALS) TABS Take 1 tablet by mouth daily.     Multiple Vitamins-Minerals (PRESERVISION AREDS 2+MULTI VIT PO) Take 1 tablet by mouth 2 (two) times daily.     NIFEdipine (PROCARDIA XL/ADALAT-CC) 60 MG 24 hr tablet TAKE 1 TABLET BY MOUTH EVERY DAY TO CONTROL BLOOD PRESSURE 90 tablet 3   pantoprazole (PROTONIX) 40 MG tablet One daily to reduce stomach acid 90 tablet 3   Polyethyl Glycol-Propyl Glycol (SYSTANE) 0.4-0.3 % GEL ophthalmic gel Place 1 application into both eyes daily as needed. Special Instructions: to both eyes any time resident request; continue scheduled order As Needed     Polyethyl Glycol-Propyl Glycol (SYSTANE) 0.4-0.3 % GEL ophthalmic gel Place 1 application into both eyes in the morning, at noon, in the evening, and at bedtime.     polyethylene glycol (MIRALAX / GLYCOLAX) packet Take 17 g by mouth daily.      pravastatin (PRAVACHOL) 10 MG tablet Take 10 mg by mouth daily.     No facility-administered medications prior to visit.    PAST MEDICAL HISTORY: Past Medical History:  Diagnosis Date   Abnormal mammogram, unspecified 04/19/2011   Allergy    Anxiety    Backache, unspecified 01/21/2005   Cervicalgia 08/20/2013   Chronic kidney disease, stage II (mild) 04/03/2009   Chronic rhinitis 09/28/2010   DDD (degenerative disc disease)     Depression    Dermatophytosis of nail 01/21/2005   Diverticulosis of colon (without mention of hemorrhage) 06/17/2005   Edema 11/26/2004   GERD (gastroesophageal reflux disease)    Hiatal hernia    High cholesterol    Hyperglycemia 11/18/2014   Hypertension    Insomnia, unspecified 10/18/2011   Left inguinal hernia 04/24/2014   Nocturia 09/19/2008   Osteoporosis, senile 11/18/2014   Other abnormal blood chemistry 04/03/2007   Pain in joint, pelvic region and thigh 11/12/2011   Pain in joint, shoulder region 08/15/2006   Pain in limb 08/26/2005   Palpitations 09/29/2009   PAOD (peripheral arterial occlusive disease) ([place]) 11/18/2014   Right leg; diminished popliteal, dorsalis pedis, and posterior tibial artery pulsations    Postmenopausal atrophic  vaginitis 12/14/2004   Scoliosis    Senile osteoporosis 12/14/2004   Tension headache 10/04/2005   Unspecified cataract 11/26/2004   Unspecified constipation 01/21/2005   Unspecified urinary incontinence 11/26/2004   Vaginal stenosis 11/18/2014   Tight band about 1/2 inches into the vagina which will not allow passage of my index finger.     PAST SURGICAL HISTORY: Past Surgical History:  Procedure Laterality Date   ABDOMINAL HYSTERECTOMY  1969   Dr.Braun    bilateral eyelid surgery  2005   Dr.Scott   BILATERAL OOPHORECTOMY  2002   BREAST SURGERY     benign tumor removal, left breast 1969   CATARACT EXTRACTION     Right   CHOLECYSTECTOMY  1990   Dr.Moore    COLONOSCOPY  3/18/20008   inflammation, diverticular associated colitis -Dr. Ardis Hughs   COSMETIC SURGERY     ESOPHAGOGASTRODUODENOSCOPY N/A 01/24/2014   Procedure: ESOPHAGOGASTRODUODENOSCOPY (EGD);  Surgeon: Irene Shipper, MD;  Location[facility]COPY;  Service: Endoscopy;  Laterality: N/A;   ESOPHAGOGASTRODUODENOSCOPY ENDOSCOPY  04/04/14   Dr. Ardis Hughs   EYE SURGERY     FOREIGN BODY REMOVAL N/A 01/24/2014   Procedure: FOREIGN BODY REMOVAL;  Surgeon: Irene Shipper, MD;  Location: [facility];  Service:  Endoscopy;  Laterality: N/A;   HIP PINNING,CANNULATED  11/07/2011   Procedure: CANNULATED HIP PINNING;  Surgeon: Jessy Oto, MD;  Location: [facility];  Service: Orthopedics;  Laterality: Left;   ORIF FEMORAL NECK FRACTURE W/ [facility]  11/07/2011   Dr.Nitka    TONSILLECTOMY     Dr.Joe Tamala Julian   TUBAL LIGATION     VAGINAL PROLAPSE REPAIR  2002   Dr.Horback     FAMILY HISTORY: Family History  Problem Relation Age of Onset   Heart disease Mother    Stroke Mother    Emphysema Father    Arthritis Father    Alcohol abuse Brother    Heart disease Son    Leukemia Son    Esophageal cancer Neg Hx    Stomach cancer Neg Hx    Colon cancer Neg Hx     SOCIAL HISTORY: Social History   Socioeconomic History   Marital status: Widowed    Spouse name: Not on file   Number of children: Not on file   Years of education: Not on file   Highest education level: Not on file  Occupational History   Occupation: retired Nurse, 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  Tobacco Use   Smoking status: Never   Smokeless tobacco: Never  Vaping Use   Vaping Use: Never used  Substance and Sexual Activity   Alcohol use: Not Currently    Alcohol/week: 0.0 standard drinks    Comment: one glass of wine in evening   Drug use: No   Sexual activity: Never  Other Topics Concern   Not on file  Social History Narrative   Lives at [facility] since 04/14/2005   Widowed (husband expired 2014)   Has Living Will, POA, MOST   Exercise: water aerobic  5 times a week   Walks with walker   Never smoked   Drinks moderate amount of caffeinate beverages daily   Alcohol none      Social Determinants of Health   Financial Resource Strain: Not on file  Food Insecurity: Not on file  Transportation Needs: Not on file  Physical Activity: Not on file  Stress: Not on file  Social Connections: Not on file  Intimate Partner Violence: Not on  file     PHYSICAL EXAM  GENERAL EXAM/CONSTITUTIONAL: Vitals:  Vitals:   12/23/20  0956  Weight: 141 lb (64 kg)   Body mass index is 25.79 kg/m. Wt Readings from Last 3 Encounters:  12/23/20 141 lb (64 kg)  11/27/20 145 lb 3.2 oz (65.9 kg)  09/01/20 145 lb 6.4 oz (66 kg)   Patient is in no distress; well developed, nourished and groomed; neck is supple  CARDIOVASCULAR: Examination of carotid arteries is normal; no carotid bruits Regular rate and rhythm, no murmurs Examination of peripheral vascular system by observation and palpation is normal  EYES: Pupils round and reactive to light, Visual fields full to confrontation, Extraocular movements intacts,   MUSCULOSKELETAL: Gait, strength, tone, movements noted in Neurologic exam below  NEUROLOGIC: MENTAL STATUS:  MMSE - [place] Exam 09/20/2017 05/31/2016 11/18/2014  Orientation to time '5 5 5  '$ Orientation to Place '5 5 5  '$ Registration '3 3 3  '$ Attention/ Calculation '5 5 5  '$ Recall '3 3 3  '$ Language- name 2 objects '2 2 2  '$ Language- repeat '1 1 1  '$ Language- follow 3 step command '3 3 3  '$ Language- read & follow direction '1 1 1  '$ Write a sentence '1 1 1  '$ Copy design - 1 1  Total score - 30 30   awake, alert, oriented to person, place and time recent and remote memory intact normal attention and concentration language fluent, comprehension intact, naming intact fund of knowledge appropriate  CRANIAL NERVE:  2nd, 3rd, 4th, 6th - pupils equal and reactive to light, visual fields full to confrontation, extraocular muscles intact, no nystagmus 5th - facial sensation symmetric 7th - facial strength symmetric 8th - hearing intact 9th - palate elevates symmetrically, uvula midline 11th - shoulder shrug symmetric 12th - tongue protrusion midline  MOTOR:  normal bulk and tone, full strength in the BUE, BLE  SENSORY:  normal and symmetric to light touch, pinprick, temperature, vibration  COORDINATION:  finger-nose-finger, fine finger movements normal  REFLEXES:  deep tendon reflexes present and  symmetric  GAIT/STATION:  normal    DIAGNOSTIC DATA (LABS, IMAGING, TESTING) - I reviewed patient records, labs, notes, testing and imaging myself where available.  Lab Results  Component Value Date   WBC 6.6 08/11/2020   HGB 12.9 08/11/2020   HCT 40 08/11/2020   MCV 88.4 08/04/2017   PLT 219 08/11/2020      Component Value Date/Time   NA 140 08/11/2020 0000   NA 143 07/10/2017 0000   K 4.5 08/11/2020 0000   K 4.2 07/10/2017 0000   CL 103 08/11/2020 0000   CL 109 07/10/2017 0000   CO2 24 (A) 08/11/2020 0000   CO2 26 07/10/2017 0000   GLUCOSE 86 01/18/2018 0831   BUN 22 (A) 08/11/2020 0000   CREATININE 0.9 08/11/2020 0000   CREATININE 0.93 (H) 01/18/2018 0831   CALCIUM 9.9 08/11/2020 0000   CALCIUM 9.3 07/10/2017 0000   CALCIUM 9.3 07/10/2017 0000   PROT 6.5 01/18/2018 0831   PROT 5.8 07/10/2017 0000   PROT 5.8 07/10/2017 0000   ALBUMIN 4.1 08/11/2020 0000   ALBUMIN 3.5 07/10/2017 0000   ALBUMIN 3.5 07/10/2017 0000   AST 19 08/11/2020 0000   AST 18 07/10/2017 0000   ALT 12 08/11/2020 0000   ALT 13 07/10/2017 0000   ALKPHOS 63 08/11/2020 0000   ALKPHOS 60 07/10/2017 0000   BILITOT 0.3 01/18/2018 0831   BILITOT 0.2 07/10/2017 0000   GFRNONAA 59  11/14/2019 0000   GFRNONAA 54 (L) 01/18/2018 0831   GFRAA 69 11/14/2019 0000   GFRAA 63 01/18/2018 0831   Lab Results  Component Value Date   CHOL 163 08/11/2020   HDL 54 08/11/2020   LDLCALC 86 08/11/2020   TRIG 124 08/11/2020   CHOLHDL 3.5 08/04/2017   Lab Results  Component Value Date   HGBA1C 5.5 09/28/2018   No results found for: VITAMINB12 Lab Results  Component Value Date   TSH 2.07 11/14/2019       ASSESSMENT AND PLAN  87 y.o. year old female here with with lightheadedness described as feeling woozy when standing up or when sitting down from a from laying down.  She denies any room spinning sensation denies any hearing loss and denies any tinnitus.  She report that she does not drink enough  fluid.  Patient likely lightheadedness is secondary to volume depletion.  Encouraged her to drink lots of fluid.  I also recommended that she set up abnormal no phone or watch to help drink enough water while she is awake.  She takes multiple medication but review of the medication list does not show any diuretics.  She is already on lorazepam for anxiety.   1. Lightheadedness     PLAN: Drink plenty of fluid.  Set up alarm/Reminder to help with drinking water. Continue your breathing your current medication at same dose. Return to clinic in 6 months or sooner if worse   No orders of the defined types were placed in this encounter.   No orders of the defined types were placed in this encounter.   Return in about 6 months (around 06/25/2021).    Alric Ran, MD 12/23/2020, 5:09 PM  [facility] [address] [place], [place] [address] [phone]

## 2021-01-01 ENCOUNTER — Ambulatory Visit: Payer: Medicare Other | Admitting: Adult Health

## 2021-01-22 DIAGNOSIS — M79671 Pain in right foot: Secondary | ICD-10-CM | POA: Diagnosis not present

## 2021-01-22 DIAGNOSIS — M79672 Pain in left foot: Secondary | ICD-10-CM | POA: Diagnosis not present

## 2021-01-22 DIAGNOSIS — B351 Tinea unguium: Secondary | ICD-10-CM | POA: Diagnosis not present

## 2021-02-03 DIAGNOSIS — F411 Generalized anxiety disorder: Secondary | ICD-10-CM | POA: Diagnosis not present

## 2021-02-03 DIAGNOSIS — Z23 Encounter for immunization: Secondary | ICD-10-CM | POA: Diagnosis not present

## 2021-02-25 ENCOUNTER — Encounter: Payer: Self-pay | Admitting: Internal Medicine

## 2021-02-25 ENCOUNTER — Non-Acute Institutional Stay: Payer: Medicare Other | Admitting: Internal Medicine

## 2021-02-25 DIAGNOSIS — R42 Dizziness and giddiness: Secondary | ICD-10-CM | POA: Diagnosis not present

## 2021-02-25 DIAGNOSIS — F411 Generalized anxiety disorder: Secondary | ICD-10-CM | POA: Diagnosis not present

## 2021-02-25 DIAGNOSIS — I10 Essential (primary) hypertension: Secondary | ICD-10-CM

## 2021-02-25 DIAGNOSIS — F339 Major depressive disorder, recurrent, unspecified: Secondary | ICD-10-CM

## 2021-02-25 DIAGNOSIS — E785 Hyperlipidemia, unspecified: Secondary | ICD-10-CM | POA: Diagnosis not present

## 2021-02-25 DIAGNOSIS — M159 Polyosteoarthritis, unspecified: Secondary | ICD-10-CM | POA: Diagnosis not present

## 2021-02-25 DIAGNOSIS — K219 Gastro-esophageal reflux disease without esophagitis: Secondary | ICD-10-CM | POA: Diagnosis not present

## 2021-02-25 NOTE — Progress Notes (Signed)
Location:   [place] Room Number: 8 Place of Service:  ALF [phone]) Provider:  Veleta Miners MD  Virgie Dad, MD  Patient Care Team: Virgie Dad, MD as PCP - General (Internal Medicine) Melina Modena, [facility] Clent Jacks, MD as Consulting Physician (Ophthalmology) Milus Banister, MD as Consulting Physician (Gastroenterology) Norma Fredrickson, MD as Consulting Physician (Psychiatry) Jessy Oto, MD as Consulting Physician (Orthopedic Surgery) Bjorn Loser, MD as Consulting Physician (Urology)  Extended Emergency Contact Information Primary Emergency Contact: [facility] Phone: [phone] Relation: Son Secondary Emergency Contact: [place], [place] [address] Johnnette Litter of [place] Phone: [phone] Mobile Phone: [phone] Relation: Son  Code Status:  Full Code Goals of care: Advanced Directive information Advanced Directives 02/25/2021  Does Patient Have a Medical Advance Directive? Yes  Type of Advance Directive Living will;Out of facility DNR (pink MOST or yellow form);Healthcare Power of Attorney  Does patient want to make changes to medical advance directive? No - Patient declined  Copy of [place] in Chart? Yes - validated most recent copy scanned in chart (See row information)  Would patient like information on creating a medical advance directive? -  Pre-existing out of facility DNR order (yellow form or pink MOST form) Pink MOST form placed in chart (order not valid for inpatient use);Yellow form placed in chart (order not valid for inpatient use)     Chief Complaint  Patient presents with   Medical Management of Chronic Issues   Quality Metric Gaps    TDAP, Flu shot    HPI:  Pt is a 87 y.o. female seen today for medical management of chronic diseases.    Patient has h/o Major Depression with Anxiety, Hyperlipidemia, Chronic Bilateral Low Back Pain, Spinal Stenosis ,  Osteoarthritis, history of osteoporosis, hypertension  [place] Her main complain continues to be feeling Anxious and Blurry vision Has seen Neurology and Opthalmology Also follows closely with [facility] says this is  all they can for her anxiety Neurology thinks there is nothing more needed right now She needs to drink more water She walks with her walker Very anxious was taking her doses Ativan before my visit Hands shaking No other issue No Falls, Weight stable Socializes Very supportive sons  Past Medical History:  Diagnosis Date   Abnormal mammogram, unspecified 04/19/2011   Allergy    Anxiety    Backache, unspecified 01/21/2005   Cervicalgia 08/20/2013   Chronic kidney disease, stage II (mild) 04/03/2009   Chronic rhinitis 09/28/2010   DDD (degenerative disc disease)    Depression    Dermatophytosis of nail 01/21/2005   Diverticulosis of colon (without mention of hemorrhage) 06/17/2005   Edema 11/26/2004   GERD (gastroesophageal reflux disease)    Hiatal hernia    High cholesterol    Hyperglycemia 11/18/2014   Hypertension    Insomnia, unspecified 10/18/2011   Left inguinal hernia 04/24/2014   Nocturia 09/19/2008   Osteoporosis, senile 11/18/2014   Other abnormal blood chemistry 04/03/2007   Pain in joint, pelvic region and thigh 11/12/2011   Pain in joint, shoulder region 08/15/2006   Pain in limb 08/26/2005   Palpitations 09/29/2009   PAOD (peripheral arterial occlusive disease) ([place]) 11/18/2014   Right leg; diminished popliteal, dorsalis pedis, and posterior tibial artery pulsations    Postmenopausal atrophic vaginitis 12/14/2004   Scoliosis    Senile osteoporosis 12/14/2004   Tension headache 10/04/2005  Unspecified cataract 11/26/2004   Unspecified constipation 01/21/2005   Unspecified urinary incontinence 11/26/2004   Vaginal stenosis 11/18/2014   Tight band about 1/2 inches into the vagina which will not allow passage of my index finger.    Past Surgical History:   Procedure Laterality Date   ABDOMINAL HYSTERECTOMY  1969   Dr.Braun    bilateral eyelid surgery  2005   Dr.Scott   BILATERAL OOPHORECTOMY  2002   BREAST SURGERY     benign tumor removal, left breast 1969   CATARACT EXTRACTION     Right   CHOLECYSTECTOMY  1990   Dr.Moore    COLONOSCOPY  3/18/20008   inflammation, diverticular associated colitis -Dr. Ardis Hughs   COSMETIC SURGERY     ESOPHAGOGASTRODUODENOSCOPY N/A 01/24/2014   Procedure: ESOPHAGOGASTRODUODENOSCOPY (EGD);  Surgeon: Irene Shipper, MD;  [facility] END[facility]COPY;  Service: Endoscopy;  Laterality: N/A;   ESOPHAGOGASTRODUODENOSCOPY ENDOSCOPY  04/04/14   Dr. Ardis Hughs   EYE SURGERY     FOREIGN BODY REMOVAL N/A 01/24/2014   Procedure: FOREIGN BODY REMOVAL;  Surgeon: Irene Shipper, MD;  Location: [facility];  Service: Endoscopy;  Laterality: N/A;   HIP PINNING,CANNULATED  11/07/2011   Procedure: CANNULATED HIP PINNING;  Surgeon: Jessy Oto, MD;  Location: [facility];  Service: Orthopedics;  Laterality: Left;   ORIF FEMORAL NECK FRACTURE W/ DHS  11/07/2011   Dr.Nitka    TONSILLECTOMY     Dr.Joe Tamala Julian   TUBAL LIGATION     VAGINAL PROLAPSE REPAIR  2002   Dr.Horback     Allergies  Allergen Reactions   Brexpiprazole Swelling   Lamictal [Lamotrigine] Swelling    Lip swelling   Penicillins Other (See Comments)    CHILDHOOD REACTION   Abilify [Aripiprazole] Rash    Allergies as of 02/25/2021       Reactions   Brexpiprazole Swelling   Lamictal [lamotrigine] Swelling   Lip swelling   Penicillins Other (See Comments)   CHILDHOOD REACTION   Abilify [aripiprazole] Rash        Medication List        Accurate as of February 25, 2021  4:57 PM. If you have any questions, ask your nurse or doctor.          acetaminophen-codeine 300-30 MG tablet Commonly known as: TYLENOL #3 Take 1 tablet by mouth every 6 (six) hours as needed for moderate pain.   Acetylcysteine 600 MG Caps Take 1 capsule by mouth daily.   antiseptic  oral rinse Liqd 15 mLs by Mouth Rinse route every 4 (four) hours as needed for dry mouth. Resident may self administer and keep in room   aspirin 81 MG chewable tablet Chew 81 mg by mouth daily.   buPROPion 300 MG 24 hr tablet Commonly known as: WELLBUTRIN XL Take 1 tablet (300 mg total) by mouth daily.   Calcium Carb-Cholecalciferol 600-800 MG-UNIT Tabs Take 1 tablet by mouth 2 (two) times daily.   chlorpheniramine-HYDROcodone 10-8 MG/5ML Suer Commonly known as: TUSSIONEX Take 5 mLs by mouth. Give 5 ml q12H PRN cough, Resident may self administer and keep in room   cholecalciferol 1000 units tablet Commonly known as: VITAMIN D Take 1,000 Units by mouth daily.   clonazePAM 0.5 MG tablet Commonly known as: KLONOPIN Take 0.5 mg by mouth 4 (four) times daily as needed for anxiety.   docusate sodium 100 MG capsule Commonly known as: Colace Take 1 capsule (100 mg total) by mouth daily.   econazole nitrate 1 % cream  Apply topically. apply once daily until till resolved, may self administer, may keep at bedside   escitalopram 20 MG tablet Commonly known as: LEXAPRO Take 1 tablet (20 mg total) by mouth daily.   Glucosamine HCl 1000 MG Tabs Take 2,000 mg by mouth 2 (two) times daily. 2 tabs   LORazepam 0.5 MG tablet Commonly known as: ATIVAN Take one tablet three times daily as needed for anxiety and take one extra tablet as needed for severe anxiety.   meloxicam 7.5 MG tablet Commonly known as: MOBIC TAKE 1 TABLET BY MOUTH EVERY DAY AS NEEDED FOR ARTHRITIS   Menthol (Topical Analgesic) 4 % Gel Apply 1 application topically as needed. Resident may self administer and keep in room   miconazole 2 % powder Commonly known as: MICOTIN Apply topically as needed for itching. apply to legs and feet once a day   mirtazapine 15 MG tablet Commonly known as: REMERON Take 30 mg by mouth at bedtime.   multivitamin with minerals Tabs tablet Take 1 tablet by mouth daily.    NIFEdipine 60 MG 24 hr tablet Commonly known as: PROCARDIA XL/NIFEDICAL XL TAKE 1 TABLET BY MOUTH EVERY DAY TO CONTROL BLOOD PRESSURE   OcuSoft Eyelid Cleansing Pads Apply 1 application topically daily.   pantoprazole 40 MG tablet Commonly known as: PROTONIX One daily to reduce stomach acid   PEPTO-BISMOL PO Take 30 mg by mouth as needed. For diarrhea   polyethylene glycol 17 g packet Commonly known as: MIRALAX / GLYCOLAX Take 17 g by mouth daily.   pravastatin 10 MG tablet Commonly known as: PRAVACHOL Take 10 mg by mouth daily.   PRESERVISION AREDS 2+MULTI VIT PO Take 1 tablet by mouth 2 (two) times daily.   Systane 0.4-0.3 % Gel ophthalmic gel Generic drug: Polyethyl Glycol-Propyl Glycol Place 1 application into both eyes daily as needed. Special Instructions: to both eyes any time resident request; continue scheduled order As Needed   Systane 0.4-0.3 % Gel ophthalmic gel Generic drug: Polyethyl Glycol-Propyl Glycol Place 1 application into both eyes in the morning, at noon, in the evening, and at bedtime.        Review of Systems  Constitutional: Negative.   HENT: Negative.    Respiratory: Negative.    Cardiovascular: Negative.   Gastrointestinal: Negative.   Genitourinary: Negative.   Musculoskeletal:  Positive for gait problem.  Skin: Negative.   Neurological:  Negative for dizziness.  Psychiatric/Behavioral:  Positive for dysphoric mood. The patient is nervous/anxious.    Immunization History  Administered Date(s) Administered   Influenza Whole 02/14/2012, 02/14/2013   Influenza, High Dose Seasonal PF 03/14/2017, 02/26/2019   Influenza-Unspecified 02/27/2014, 02/12/2015, 02/25/2016, 03/15/2017, 02/19/2018, 02/18/2020   Moderna SARS-COV2 Booster Vaccination 10/21/2020   Moderna Sars-Covid-2 Vaccination 05/20/2019, 06/17/2019, 03/30/2020   Pneumococcal Conjugate-13 07/13/2015   Pneumococcal Polysaccharide-23 05/16/1998   Td 05/16/2004   Zoster  Recombinat (Shingrix) 02/20/2018, 05/11/2018, 07/16/2018   Zoster, Live 05/16/2005   Pertinent  Health Maintenance Due  Topic Date Due   INFLUENZA VACCINE  12/14/2020   DEXA SCAN  Completed   MAMMOGRAM  Discontinued   Fall Risk  04/09/2019 04/25/2018 04/05/2018 04/21/2017 10/17/2016  Falls in the past year? 0 0 0 No No  Comment Emmi Telephone Survey: data to providers prior to load - Emmi Telephone Survey: data to providers prior to load - -  Number falls in past yr: - 0 - - -  Injury with Fall? - 0 - - -   Functional Status Survey:  Vitals:   02/25/21 1648  BP: (!) 144/87  Pulse: 84  Resp: 20  Temp: (!) 97.4 F (36.3 C)  SpO2: 96%  Weight: 146 lb 9.6 oz (66.5 kg)  Height: 5\' 2"  (1.575 m)   Body mass index is 26.81 kg/m. Physical Exam Vitals reviewed.  Constitutional:      Appearance: Normal appearance.  HENT:     Head: Normocephalic.     Nose: Nose normal.     Mouth/Throat:     Mouth: Mucous membranes are moist.     Pharynx: Oropharynx is clear.  Eyes:     Pupils: Pupils are equal, round, and reactive to light.  Cardiovascular:     Rate and Rhythm: Normal rate and regular rhythm.     Pulses: Normal pulses.     Heart sounds: Murmur heard.  Pulmonary:     Effort: Pulmonary effort is normal.     Breath sounds: Normal breath sounds.  Abdominal:     General: Abdomen is flat. Bowel sounds are normal.     Palpations: Abdomen is soft.  Musculoskeletal:        General: No swelling.     Cervical back: Neck supple.  Skin:    General: Skin is warm.  Neurological:     General: No focal deficit present.     Mental Status: She is alert and oriented to person, place, and time.  Psychiatric:     Comments: Nervous and Anxious    Labs reviewed: Recent Labs    03/09/20 0000 08/11/20 0000  NA 139 140  K 4.3 4.5  CL 103 103  CO2 27* 24*  BUN 23* 22*  CREATININE 0.8 0.9  CALCIUM 10.0 9.9   Recent Labs    03/09/20 0000 08/11/20 0000  AST 16 19  ALT 12 12   ALKPHOS 64 63  ALBUMIN 4.1 4.1   Recent Labs    03/09/20 0000 08/11/20 0000  WBC 6.7 6.6  HGB 12.8 12.9  HCT 39 40  PLT 240 219   Lab Results  Component Value Date   TSH 2.07 11/14/2019   Lab Results  Component Value Date   HGBA1C 5.5 09/28/2018   Lab Results  Component Value Date   CHOL 163 08/11/2020   HDL 54 08/11/2020   LDLCALC 86 08/11/2020   TRIG 124 08/11/2020   CHOLHDL 3.5 08/04/2017    Significant Diagnostic Results in last 30 days:  No results found.  Assessment/Plan Light headedness Seen By Neurology and agree that Imagine will not help Continue supportive care Encourage PO fluids Labs done in 7/22 were all normal  GAD (generalized anxiety disorder) Continues ot be her main Issue On Klonopin and Ativan Prn per Psych services Major depression, recurrent, chronic (HCC) On Wellbutrin and Remeron per Psych sevices Gastroesophageal reflux disease without esophagitis On Protonix Back pain Mobic PRN Primary hypertension On Procardia   Hyperlipidemia LDL goal <100 On statin    Family/ staff Communication:   Labs/tests ordered:

## 2021-03-03 DIAGNOSIS — F411 Generalized anxiety disorder: Secondary | ICD-10-CM | POA: Diagnosis not present

## 2021-03-08 DIAGNOSIS — E079 Disorder of thyroid, unspecified: Secondary | ICD-10-CM | POA: Diagnosis not present

## 2021-03-08 DIAGNOSIS — D649 Anemia, unspecified: Secondary | ICD-10-CM | POA: Diagnosis not present

## 2021-03-08 DIAGNOSIS — E039 Hypothyroidism, unspecified: Secondary | ICD-10-CM | POA: Diagnosis not present

## 2021-03-08 LAB — LIPID PANEL
Cholesterol: 164 (ref 0–200)
HDL: 56 (ref 35–70)
LDL Cholesterol: 84
LDl/HDL Ratio: 2.9
Triglycerides: 139 (ref 40–160)

## 2021-03-08 LAB — HEPATIC FUNCTION PANEL
ALT: 12 (ref 7–35)
AST: 17 (ref 13–35)
Alkaline Phosphatase: 65 (ref 25–125)
Bilirubin, Direct: 0.4 (ref 0.01–0.4)

## 2021-03-08 LAB — BASIC METABOLIC PANEL
BUN: 19 (ref 4–21)
CO2: 27 — AB (ref 13–22)
Chloride: 99 (ref 99–108)
Creatinine: 0.9 (ref 0.5–1.1)
Glucose: 106
Potassium: 4.3 (ref 3.4–5.3)
Sodium: 138 (ref 137–147)

## 2021-03-08 LAB — COMPREHENSIVE METABOLIC PANEL
Albumin: 4.2 (ref 3.5–5.0)
Calcium: 10 (ref 8.7–10.7)
Globulin: 2.8

## 2021-03-08 LAB — TSH: TSH: 2.1 (ref 0.41–5.90)

## 2021-03-08 LAB — CBC AND DIFFERENTIAL
HCT: 40 (ref 36–46)
Hemoglobin: 12.8 (ref 12.0–16.0)
Neutrophils Absolute: 4416
Platelets: 254 (ref 150–399)
WBC: 7.1

## 2021-03-08 LAB — CBC: RBC: 4.39 (ref 3.87–5.11)

## 2021-03-30 ENCOUNTER — Non-Acute Institutional Stay: Payer: Medicare Other | Admitting: Nurse Practitioner

## 2021-03-30 ENCOUNTER — Encounter: Payer: Self-pay | Admitting: Nurse Practitioner

## 2021-03-30 DIAGNOSIS — F339 Major depressive disorder, recurrent, unspecified: Secondary | ICD-10-CM | POA: Diagnosis not present

## 2021-03-30 DIAGNOSIS — E785 Hyperlipidemia, unspecified: Secondary | ICD-10-CM | POA: Diagnosis not present

## 2021-03-30 DIAGNOSIS — I10 Essential (primary) hypertension: Secondary | ICD-10-CM

## 2021-03-30 DIAGNOSIS — M48062 Spinal stenosis, lumbar region with neurogenic claudication: Secondary | ICD-10-CM | POA: Diagnosis not present

## 2021-03-30 DIAGNOSIS — K219 Gastro-esophageal reflux disease without esophagitis: Secondary | ICD-10-CM

## 2021-03-30 DIAGNOSIS — F411 Generalized anxiety disorder: Secondary | ICD-10-CM | POA: Diagnosis not present

## 2021-03-30 DIAGNOSIS — K6289 Other specified diseases of anus and rectum: Secondary | ICD-10-CM | POA: Insufficient documentation

## 2021-03-30 DIAGNOSIS — N939 Abnormal uterine and vaginal bleeding, unspecified: Secondary | ICD-10-CM

## 2021-03-30 DIAGNOSIS — K5900 Constipation, unspecified: Secondary | ICD-10-CM

## 2021-03-30 DIAGNOSIS — R42 Dizziness and giddiness: Secondary | ICD-10-CM | POA: Diagnosis not present

## 2021-03-30 NOTE — Assessment & Plan Note (Signed)
prn Mobic, Tylenol,  ambulates with walker.

## 2021-03-30 NOTE — Assessment & Plan Note (Signed)
seen by Psych, Clonazepam, TSH 2.0 11/14/19

## 2021-03-30 NOTE — Assessment & Plan Note (Signed)
No internal or external hemorrhoids noted, incontinent of urine and constipation are contributory, will apply 2% Hydrocortisone bid to anal area bid prn.

## 2021-03-30 NOTE — Assessment & Plan Note (Signed)
blood pressure is controlled, on Nifedipine, ASA, Bun/creat 19/0.93 eGFR 57 03/08/21

## 2021-03-30 NOTE — Assessment & Plan Note (Signed)
takes Pravastatin. LDL 84 03/08/21

## 2021-03-30 NOTE — Assessment & Plan Note (Signed)
on and off, chronic, seen by Neurology,  MRI brain-Patient declined it.

## 2021-03-30 NOTE — Assessment & Plan Note (Addendum)
c/o constipation, last BM this morning, but still feel incompletion of fecal evaluation. She has been taking MiraLax, added Senna. Denied nausea, vomiting, abd pain, or dysuria.  Dc Senna, try Amitiza 8mg  bid. Observe.

## 2021-03-30 NOTE — Assessment & Plan Note (Signed)
takes Wellbutrin, Mirtazapine, Lexapro, Clonazepam

## 2021-03-30 NOTE — Progress Notes (Signed)
Location:   [facility] Room Number: 8 A Place of Service:  ALF (13) Provider: Lennie Odor Keontae Levingston NP  Virgie Dad, MD  Patient Care Team: Virgie Dad, MD as PCP - General (Internal Medicine) Melina Modena, [facility] Clent Jacks, MD as Consulting Physician (Ophthalmology) Milus Banister, MD as Consulting Physician (Gastroenterology) Norma Fredrickson, MD as Consulting Physician (Psychiatry) Jessy Oto, MD as Consulting Physician (Orthopedic Surgery) Bjorn Loser, MD as Consulting Physician (Urology)  Extended Emergency Contact Information Primary Emergency Contact: [facility] Phone: [phone] Relation: Son Secondary Emergency Contact: [place], [place] [address] Johnnet[place] Phone: [phone] Mobile Phone: [phone] Relation: Son  Code Status:  DNR Goals of care: Advanced Directive information Advanced Directives 03/30/2021  Does Patient Have a Medical Advance Directive? Yes  Type of Advance Directive Living will;Out of facility DNR (pink MOST or yellow form);Healthcare Power of Attorney  Does patient want to make changes to medical advance directive? No - Patient declined  Copy of [place] in Chart? Yes - validated most recent copy scanned in chart (See row information)  Would patient like information on creating a medical advance directive? -  Pre-existing out of facility DNR order (yellow form or pink MOST form) Pink MOST form placed in chart (order not valid for inpatient use);Yellow form placed in chart (order not valid for inpatient use)     Chief Complaint  Patient presents with   Acute Visit    Acute visit for Constipation.     HPI:  Pt is a 87 y.o. female seen today for an acute visit for c/o constipation, last BM this morning, but still feel incompletion of fecal evaluation. She has been taking MiraLax, added Senna. Denied nausea, vomiting, abd pain, or dysuria.  Hx of  vaginal bleeding/spoting on and off since the prolapsed vagina repair due to the surgical scar tissue. declined GYN f/u.              GERD, stable, on Pantoprazole             Constipation, on Senokot, MiraLax             Lower back pain, prn Mobic, Tylenol,  ambulates with walker.              HTN, blood pressure is controlled, on Nifedipine, ASA, Bun/creat 19/0.93 eGFR 57 03/08/21             Anxiety seen by Psych, Clonazepam, TSH 2.0 11/14/19             Depression: takes Wellbutrin, Mirtazapine, Lexapro, Clonazepam             Dizziness, on and off, chronic, seen by Neurology,  MRI brain-Patient declined it.              Hyperlipidemia, takes Pravastatin. LDL 84 03/08/21  Urinary incontinent, more at night.     Past Medical History:  Diagnosis Date   Abnormal mammogram, unspecified 04/19/2011   Allergy    Anxiety    Backache, unspecified 01/21/2005   Cervicalgia 08/20/2013   Chronic kidney disease, stage II (mild) 04/03/2009   Chronic rhinitis 09/28/2010   DDD (degenerative disc disease)    Depression    Dermatophytosis of nail 01/21/2005   Diverticulosis of colon (without mention of hemorrhage) 06/17/2005   Edema 11/26/2004   GERD (gastroesophageal reflux disease)    Hiatal hernia    High  cholesterol    Hyperglycemia 11/18/2014   Hypertension    Insomnia, unspecified 10/18/2011   Left inguinal hernia 04/24/2014   Nocturia 09/19/2008   Osteoporosis, senile 11/18/2014   Other abnormal blood chemistry 04/03/2007   Pain in joint, pelvic region and thigh 11/12/2011   Pain in joint, shoulder region 08/15/2006   Pain in limb 08/26/2005   Palpitations 09/29/2009   PAOD (peripheral arterial occlusive disease) ([place]) 11/18/2014   Right leg; diminished popliteal, dorsalis pedis, and posterior tibial artery pulsations    Postmenopausal atrophic vaginitis 12/14/2004   Scoliosis    Senile osteoporosis 12/14/2004   Tension headache 10/04/2005   Unspecified cataract 11/26/2004   Unspecified constipation 01/21/2005    Unspecified urinary incontinence 11/26/2004   Vaginal stenosis 11/18/2014   Tight band about 1/2 inches into the vagina which will not allow passage of my index finger.    Past Surgical History:  Procedure Laterality Date   ABDOMINAL HYSTERECTOMY  1969   Dr.Braun    bilateral eyelid surgery  2005   Dr.Scott   BILATERAL OOPHORECTOMY  2002   BREAST SURGERY     benign tumor removal, left breast 1969   CATARACT EXTRACTION     Right   CHOLECYSTECTOMY  1990   Dr.Moore    COLONOSCOPY  3/18/20008   inflammation, diverticular associated colitis -Dr. Ardis Hughs   COSMETIC SURGERY     ESOPHAGOGASTRODUODENOSCOPY N/A 01/24/2014   Procedure: ESOPHAGOGASTRODUODENOSCOPY (EGD);  Surgeon: Irene Shipper, MD;  Location: [facility] ENDOSCOPY;  Service: Endoscopy;  Laterality: N/A;   ESOPHAGOGASTRODUODENOSCOPY ENDOSCOPY  04/04/14   Dr. Ardis Hughs   EYE SURGERY     FOREIGN BODY REMOVAL N/A 01/24/2014   Procedure: FOREIGN BODY REMOVAL;  Surgeon: Irene Shipper, MD;  Location: [facility];  Service: Endoscopy;  Laterality: N/A;   HIP PINNING,CANNULATED  11/07/2011   Procedure: CANNULATED HIP PINNING;  Surgeon: Jessy Oto, MD;  Location: [facility];  Service: Orthopedics;  Laterality: Left;   ORIF FEMORAL NECK FRACTURE W/ DHS  11/07/2011   Dr.Nitka    TONSILLECTOMY     Dr.Joe Tamala Julian   TUBAL LIGATION     VAGINAL PROLAPSE REPAIR  2002   Dr.Horback     Allergies  Allergen Reactions   Brexpiprazole Swelling   Lamictal [Lamotrigine] Swelling    Lip swelling   Penicillins Other (See Comments)    CHILDHOOD REACTION   Abilify [Aripiprazole] Rash    Allergies as of 03/30/2021       Reactions   Brexpiprazole Swelling   Lamictal [lamotrigine] Swelling   Lip swelling   Penicillins Other (See Comments)   CHILDHOOD REACTION   Abilify [aripiprazole] Rash        Medication List        Accurate as of March 30, 2021 11:59 PM. If you have any questions, ask your nurse or doctor.          acetaminophen-codeine  300-30 MG tablet Commonly known as: TYLENOL #3 Take 1 tablet by mouth every 6 (six) hours as needed for moderate pain.   Acetylcysteine 600 MG Caps Take 1 capsule by mouth daily.   antiseptic oral rinse Liqd 15 mLs by Mouth Rinse route every 4 (four) hours as needed for dry mouth. Resident may self administer and keep in room   aspirin 81 MG chewable tablet Chew 81 mg by mouth daily.   buPROPion 300 MG 24 hr tablet Commonly known as: WELLBUTRIN XL Take 1 tablet (300 mg total) by mouth daily.   Calcium  Carb-Cholecalciferol 600-800 MG-UNIT Tabs Take 1 tablet by mouth 2 (two) times daily.   chlorpheniramine-HYDROcodone 10-8 MG/5ML Suer Commonly known as: TUSSIONEX Take 5 mLs by mouth. Give 5 ml q12H PRN cough, Resident may self administer and keep in room   cholecalciferol 1000 units tablet Commonly known as: VITAMIN D Take 1,000 Units by mouth daily.   clonazePAM 0.5 MG tablet Commonly known as: KLONOPIN Take by mouth 4 (four) times daily as needed for anxiety. Take 1/2 tablet up to 4 times daily as needed for anxiety.   docusate sodium 100 MG capsule Commonly known as: Colace Take 1 capsule (100 mg total) by mouth daily.   econazole nitrate 1 % cream Apply topically. apply once daily until till resolved, may self administer, may keep at bedside   escitalopram 20 MG tablet Commonly known as: LEXAPRO Take 1 tablet (20 mg total) by mouth daily.   GLUCOSAMINE HCL PO Take 2,000 mg by mouth 2 (two) times daily.   LORazepam 0.5 MG tablet Commonly known as: ATIVAN Take one tablet three times daily as needed for anxiety and take one extra tablet as needed for severe anxiety.   meloxicam 7.5 MG tablet Commonly known as: MOBIC TAKE 1 TABLET BY MOUTH EVERY DAY AS NEEDED FOR ARTHRITIS   Menthol (Topical Analgesic) 4 % Gel Apply 1 application topically as needed. Resident may self administer and keep in room   miconazole 2 % powder Commonly known as: MICOTIN Apply  topically as needed for itching. apply to legs and feet once a day   mirtazapine 30 MG tablet Commonly known as: REMERON Take 30 mg by mouth at bedtime.   multivitamin with minerals Tabs tablet Take 1 tablet by mouth daily.   NIFEdipine 60 MG 24 hr tablet Commonly known as: PROCARDIA XL/NIFEDICAL XL TAKE 1 TABLET BY MOUTH EVERY DAY TO CONTROL BLOOD PRESSURE   OcuSoft Eyelid Cleansing Pads Apply 1 application topically daily.   pantoprazole 40 MG tablet Commonly known as: PROTONIX One daily to reduce stomach acid   PEPTO-BISMOL PO Take 30 mg by mouth as needed. For diarrhea   polyethylene glycol 17 g packet Commonly known as: MIRALAX / GLYCOLAX Take 17 g by mouth daily. Mix 17g  with 8 oz. of fluid daily for constipation What changed: Another medication with the same name was removed. Continue taking this medication, and follow the directions you see here. Changed by: Nikiyah Fackler X Melaysia Streed, NP   pravastatin 10 MG tablet Commonly known as: PRAVACHOL Take 10 mg by mouth daily.   PRESERVISION AREDS 2+MULTI VIT PO Take 1 tablet by mouth 2 (two) times daily.   senna 8.6 MG Tabs tablet Commonly known as: SENOKOT Take 2 tablets by mouth at bedtime.   Systane 0.4-0.3 % Gel ophthalmic gel Generic drug: Polyethyl Glycol-Propyl Glycol Place 1 application into both eyes in the morning, at noon, in the evening, and at bedtime. What changed: Another medication with the same name was removed. Continue taking this medication, and follow the directions you see here. Changed by: Matia Zelada X Nasrin Lanzo, NP   Systane 0.4-0.3 % Gel ophthalmic gel Generic drug: Polyethyl Glycol-Propyl Glycol Place 1 application into both eyes as needed. What changed: Another medication with the same name was removed. Continue taking this medication, and follow the directions you see here. Changed by: Delorus Langwell X Krishay Faro, NP   Systane 0.4-0.3 % Soln Generic drug: Polyethyl Glycol-Propyl Glycol Apply 1 drop to eye 2 (two) times daily  as needed. What changed: Another medication with  the same name was removed. Continue taking this medication, and follow the directions you see here. Changed by: Shakena Callari X Derryck Shahan, NP        Review of Systems  Constitutional:  Negative for appetite change, fatigue and fever.  HENT:  Positive for hearing loss. Negative for congestion and voice change.   Eyes:  Negative for visual disturbance.  Respiratory:  Negative for shortness of breath.   Cardiovascular:  Positive for leg swelling. Negative for chest pain and palpitations.  Gastrointestinal:  Positive for constipation. Negative for abdominal pain, blood in stool, nausea and vomiting.       Anal area irritation, itching, burning   Genitourinary:  Negative for dysuria, hematuria and urgency.       Incontinent of urine  Musculoskeletal:  Positive for arthralgias, back pain and gait problem.  Skin:  Negative for color change and pallor.  Neurological:  Positive for tremors and numbness. Negative for speech difficulty, weakness, light-headedness and headaches.       Right finger resting tremor mild, left fingers feels numb  Psychiatric/Behavioral:  Positive for dysphoric mood. Negative for behavioral problems, hallucinations and sleep disturbance. The patient is nervous/anxious.        Worries, fears   Immunization History  Administered Date(s) Administered   Influenza Whole 02/14/2012, 02/14/2013   Influenza, High Dose Seasonal PF 03/14/2017, 02/26/2019   Influenza-Unspecified 02/27/2014, 02/12/2015, 02/25/2016, 03/15/2017, 02/19/2018, 02/18/2020, 03/09/2021   Moderna SARS-COV2 Booster Vaccination 10/21/2020   Moderna Sars-Covid-2 Vaccination 05/20/2019, 06/17/2019, 03/30/2020   Pneumococcal Conjugate-13 07/13/2015   Pneumococcal Polysaccharide-23 05/16/1998   Td 05/16/2004   Zoster Recombinat (Shingrix) 02/20/2018, 05/11/2018, 07/16/2018   Zoster, Live 05/16/2005   Pertinent  Health Maintenance Due  Topic Date Due   INFLUENZA VACCINE   Completed   DEXA SCAN  Completed   MAMMOGRAM  Discontinued   Fall Risk 10/17/2016 04/21/2017 04/05/2018 04/25/2018 04/09/2019  Falls in the past year? No No 0 0 0  Was there an injury with Fall? - - - 0 -  Fall Risk Category Calculator - - - 0 -  Fall Risk Category - - - Low -   Functional Status Survey:    Vitals:   03/30/21 1315  BP: 136/79  Pulse: 86  Resp: 20  Temp: (!) 97.5 F (36.4 C)  SpO2: 95%  Weight: 144 lb 12.8 oz (65.7 kg)  Height: 5' 2"  (1.575 m)   Body mass index is 26.48 kg/m. Physical Exam Vitals and nursing note reviewed.  Constitutional:      Appearance: Normal appearance.  HENT:     Head: Normocephalic and atraumatic.     Mouth/Throat:     Mouth: Mucous membranes are moist.  Eyes:     Extraocular Movements: Extraocular movements intact.     Conjunctiva/sclera: Conjunctivae normal.     Pupils: Pupils are equal, round, and reactive to light.  Cardiovascular:     Rate and Rhythm: Normal rate and regular rhythm.     Heart sounds: Murmur heard.  Pulmonary:     Breath sounds: No rales.  Abdominal:     General: Bowel sounds are normal.     Palpations: Abdomen is soft.     Tenderness: There is no abdominal tenderness. There is no right CVA tenderness, left CVA tenderness, guarding or rebound.  Genitourinary:    Rectum: Normal.     Comments: No internal or external hemorrhoids noted, no hard stools in the rectal vault.  Musculoskeletal:     Cervical back: Normal range of  motion and neck supple.     Right lower leg: Edema present.     Left lower leg: Edema present.     Comments: Trace edema BLE. TED  Skin:    General: Skin is warm and dry.  Neurological:     General: No focal deficit present.     Mental Status: She is alert and oriented to person, place, and time. Mental status is at baseline.     Gait: Gait abnormal.  Psychiatric:     Comments: Anxious, repetitive.     Labs reviewed: Recent Labs    08/11/20 0000 12/01/20 0000 03/08/21 0000   NA 140 138 138  K 4.5 4.1 4.3  CL 103 101 99  CO2 24* 27* 27*  BUN 22* 26* 19  CREATININE 0.9 1.0 0.9  CALCIUM 9.9 9.7 10.0   Recent Labs    08/11/20 0000 12/01/20 0000 03/08/21 0000  AST 19 17 17   ALT 12 12 12   ALKPHOS 63 64 65  ALBUMIN 4.1 4.0 4.2   Recent Labs    08/11/20 0000 12/01/20 0000 03/08/21 0000  WBC 6.6 5.2 7.1  NEUTROABS  --   --  4,416.00  HGB 12.9 12.7 12.8  HCT 40 39 40  PLT 219 202 254   Lab Results  Component Value Date   TSH 2.10 03/08/2021   Lab Results  Component Value Date   HGBA1C 5.5 09/28/2018   Lab Results  Component Value Date   CHOL 164 03/08/2021   HDL 56 03/08/2021   LDLCALC 84 03/08/2021   TRIG 139 03/08/2021   CHOLHDL 3.5 08/04/2017    Significant Diagnostic Results in last 30 days:  No results found.  Assessment/Plan Hypertension blood pressure is controlled, on Nifedipine, ASA, Bun/creat 19/0.93 eGFR 57 03/08/21  GAD (generalized anxiety disorder) seen by Psych, Clonazepam, TSH 2.0 11/14/19  Major depression, recurrent, chronic (HCC)  takes Wellbutrin, Mirtazapine, Lexapro, Clonazepam  Dizziness on and off, chronic, seen by Neurology,  MRI brain-Patient declined it.   Hyperlipidemia LDL goal <100 takes Pravastatin. LDL 84 03/08/21  Spinal stenosis of lumbar region with neurogenic claudication prn Mobic, Tylenol,  ambulates with walker.   Constipation c/o constipation, last BM this morning, but still feel incompletion of fecal evaluation. She has been taking MiraLax, added Senna. Denied nausea, vomiting, abd pain, or dysuria.  Dc Senna, try Amitiza 8m bid. Observe.   GERD (gastroesophageal reflux disease) stable, on Pantoprazole  Vaginal bleeding  vaginal bleeding/spoting on and off since the prolapsed vagina repair due to the surgical scar tissue. declined GYN f/u.   Anal irritation No internal or external hemorrhoids noted, incontinent of urine and constipation are contributory, will apply 2%  Hydrocortisone bid to anal area bid prn.     Family/ staff Communication: plan of care reviewed with the patient and charge nurse.   Labs/tests ordered: none  Time spend 40 minutes.

## 2021-03-30 NOTE — Assessment & Plan Note (Signed)
vaginal bleeding/spoting on and off since the prolapsed vagina repair due to the surgical scar tissue. declined GYN f/u.

## 2021-03-30 NOTE — Assessment & Plan Note (Signed)
stable, on Pantoprazole  

## 2021-04-01 ENCOUNTER — Encounter: Payer: Self-pay | Admitting: Nurse Practitioner

## 2021-04-02 DIAGNOSIS — M79672 Pain in left foot: Secondary | ICD-10-CM | POA: Diagnosis not present

## 2021-04-02 DIAGNOSIS — M79671 Pain in right foot: Secondary | ICD-10-CM | POA: Diagnosis not present

## 2021-04-02 DIAGNOSIS — B351 Tinea unguium: Secondary | ICD-10-CM | POA: Diagnosis not present

## 2021-04-05 DIAGNOSIS — F411 Generalized anxiety disorder: Secondary | ICD-10-CM | POA: Diagnosis not present

## 2021-04-20 ENCOUNTER — Telehealth: Payer: Self-pay | Admitting: Adult Health

## 2021-04-20 DIAGNOSIS — F411 Generalized anxiety disorder: Secondary | ICD-10-CM | POA: Diagnosis not present

## 2021-04-20 NOTE — Telephone Encounter (Signed)
Rtc to pt and she has a couple things to discuss, didn't know if she needed an apt. She is asking who could replace [facility] and they are also going to be doing medications different where she lives now and she wanted to discuss that. She didn't know if anything needed changing.

## 2021-04-20 NOTE — Telephone Encounter (Signed)
Please call.

## 2021-04-20 NOTE — Telephone Encounter (Signed)
Pt requesting call from you

## 2021-04-20 NOTE — Telephone Encounter (Signed)
Pt LVM asking for Barnett Applebaum to call her.   No upcoming appt.

## 2021-04-20 NOTE — Telephone Encounter (Signed)
Pls call to get some details.

## 2021-04-21 NOTE — Telephone Encounter (Signed)
Lets get her set up for an appt to discuss.

## 2021-04-22 NOTE — Telephone Encounter (Signed)
[name], can we please get Ms. Tina Hampton set up with Barnett Applebaum to discuss things.

## 2021-04-22 NOTE — Telephone Encounter (Signed)
Has apt 12/13 with Barnett Applebaum

## 2021-04-27 ENCOUNTER — Ambulatory Visit (INDEPENDENT_AMBULATORY_CARE_PROVIDER_SITE_OTHER): Payer: Medicare Other | Admitting: Adult Health

## 2021-04-27 ENCOUNTER — Encounter: Payer: Self-pay | Admitting: Adult Health

## 2021-04-27 DIAGNOSIS — F429 Obsessive-compulsive disorder, unspecified: Secondary | ICD-10-CM

## 2021-04-27 DIAGNOSIS — G4700 Insomnia, unspecified: Secondary | ICD-10-CM

## 2021-04-27 DIAGNOSIS — F331 Major depressive disorder, recurrent, moderate: Secondary | ICD-10-CM

## 2021-04-27 DIAGNOSIS — F411 Generalized anxiety disorder: Secondary | ICD-10-CM | POA: Diagnosis not present

## 2021-04-27 NOTE — Progress Notes (Signed)
Tina Hampton [id number] [birth year] 87 y.o.  Virtual Visit via Telephone Note  I connected with pt on 04/27/21 at  1:40 PM EST by telephone and verified that I am speaking with the correct person using two identifiers.   I discussed the limitations, risks, security and privacy concerns of performing an evaluation and management service by telephone and the availability of in person appointments. I also discussed with the patient that there may be a patient responsible charge related to this service. The patient expressed understanding and agreed to proceed.   I discussed the assessment and treatment plan with the patient. The patient was provided an opportunity to ask questions and all were answered. The patient agreed with the plan and demonstrated an understanding of the instructions.   The patient was advised to call back or seek an in-person evaluation if the symptoms worsen or if the condition fails to improve as anticipated.  I provided 25 minutes of non-face-to-face time during this encounter.  The patient was located at home.  The provider was located at [place].   Aloha Gell, NP   Subjective:   Patient ID:  Tina Hampton is a 87 y.o. (DOB 1925/10/21) female.  Chief Complaint: No chief complaint on file.   HPI Tina Hampton presents for follow-up of OCD, anxiety, depression, and insomnia.  Describes mood today as "shaky feeling". Pleasant. Denies tearfulness. Mood symptoms - reports depression, anxiety, and irritability at times. Feels more anxious overall - "more so during the day". Stating "I still feel nervous and shaky during the day". Struggles with feeling "love" towards others. Has turned all medications over for nursing to manage except the Lorazepam. Continues to take it 4 times during the day. Reports visual challenges that have not improved. Talking with therapist regularly - Sunday Spillers - is worried about losing her support when she  retires next year. Notes their is a new organization "[facility]" that is planning to start at the beginning of the year and will provide therapy and medication management - plans to consider these options since she is unable to get out much. Taking medications as prescribed, but would like to know if there is anything further that she can take to help manage mood symptoms.  Energy levels varies. Active, exercises twice a week. Enjoys some usual interests and activities. Widowed. Lives at [facility]. Talking with family and friends. Appetite adequate. Weight stable Sleeps well most nights. Averages 8 hours.  Focus and concentration stable. Managing own medications.Completing tasks.Taking medications independently. Denies SI or HI. Denies AH or VH.  Multiple medication trials and failures: Latuda, Abilify, Rexulti, Effexor, Prozac, Buspar, Lamictal, Lexapro, Trintellix, Lorazepam, Luvox, Lorazepam.    Review of Systems:  Review of Systems  Musculoskeletal:  Negative for gait problem.  Neurological:  Negative for tremors.  Psychiatric/Behavioral:         Please refer to HPI   Medications: I have reviewed the patient's current medications.  Current Outpatient Medications  Medication Sig Dispense Refill   acetaminophen-codeine (TYLENOL #3) 300-30 MG tablet Take 1 tablet by mouth every 6 (six) hours as needed for moderate pain.     Acetylcysteine 600 MG CAPS Take 1 capsule by mouth daily.     antiseptic oral rinse (BIOTENE) LIQD 15 mLs by Mouth Rinse route every 4 (four) hours as needed for dry mouth. Resident may self administer and keep in room     aspirin 81 MG chewable tablet Chew 81 mg by mouth daily.  Bismuth Subsalicylate (PEPTO-BISMOL PO) Take 30 mg by mouth as needed. For diarrhea     buPROPion (WELLBUTRIN XL) 300 MG 24 hr tablet Take 1 tablet (300 mg total) by mouth daily. 30 tablet 5   Calcium Carb-Cholecalciferol 600-800 MG-UNIT TABS Take 1 tablet by mouth 2 (two) times  daily.     chlorpheniramine-HYDROcodone (TUSSIONEX) 10-8 MG/5ML SUER Take 5 mLs by mouth. Give 5 ml q12H PRN cough, Resident may self administer and keep in room     cholecalciferol (VITAMIN D) 1000 UNITS tablet Take 1,000 Units by mouth daily.     clonazePAM (KLONOPIN) 0.5 MG tablet Take by mouth 4 (four) times daily as needed for anxiety. Take 1/2 tablet up to 4 times daily as needed for anxiety.     docusate sodium (COLACE) 100 MG capsule Take 1 capsule (100 mg total) by mouth daily. 30 capsule 0   econazole nitrate 1 % cream Apply topically. apply once daily until till resolved, may self administer, may keep at bedside     escitalopram (LEXAPRO) 20 MG tablet Take 1 tablet (20 mg total) by mouth daily. 30 tablet 5   Eyelid Cleansers (OCUSOFT EYELID CLEANSING) PADS Apply 1 application topically daily.     GLUCOSAMINE HCL PO Take 2,000 mg by mouth 2 (two) times daily.     LORazepam (ATIVAN) 0.5 MG tablet Take one tablet three times daily as needed for anxiety and take one extra tablet as needed for severe anxiety. 120 tablet 2   meloxicam (MOBIC) 7.5 MG tablet TAKE 1 TABLET BY MOUTH EVERY DAY AS NEEDED FOR ARTHRITIS 90 tablet 1   Menthol, Topical Analgesic, 4 % GEL Apply 1 application topically as needed. Resident may self administer and keep in room     miconazole (MICOTIN) 2 % powder Apply topically as needed for itching. apply to legs and feet once a day     mirtazapine (REMERON) 30 MG tablet Take 30 mg by mouth at bedtime.      Multiple Vitamin (MULTIVITAMIN WITH MINERALS) TABS Take 1 tablet by mouth daily.     Multiple Vitamins-Minerals (PRESERVISION AREDS 2+MULTI VIT PO) Take 1 tablet by mouth 2 (two) times daily.     NIFEdipine (PROCARDIA XL/ADALAT-CC) 60 MG 24 hr tablet TAKE 1 TABLET BY MOUTH EVERY DAY TO CONTROL BLOOD PRESSURE 90 tablet 3   pantoprazole (PROTONIX) 40 MG tablet One daily to reduce stomach acid 90 tablet 3   Polyethyl Glycol-Propyl Glycol (SYSTANE) 0.4-0.3 % GEL ophthalmic  gel Place 1 application into both eyes in the morning, at noon, in the evening, and at bedtime.     Polyethyl Glycol-Propyl Glycol (SYSTANE) 0.4-0.3 % GEL ophthalmic gel Place 1 application into both eyes as needed.     Polyethyl Glycol-Propyl Glycol (SYSTANE) 0.4-0.3 % SOLN Apply 1 drop to eye 2 (two) times daily as needed.     polyethylene glycol (MIRALAX / GLYCOLAX) 17 g packet Take 17 g by mouth daily. Mix 17g  with 8 oz. of fluid daily for constipation     pravastatin (PRAVACHOL) 10 MG tablet Take 10 mg by mouth daily.     senna (SENOKOT) 8.6 MG TABS tablet Take 2 tablets by mouth at bedtime.     No current facility-administered medications for this visit.    Medication Side Effects: None  Allergies:  Allergies  Allergen Reactions   Brexpiprazole Swelling   Lamictal [Lamotrigine] Swelling    Lip swelling   Penicillins Other (See Comments)    CHILDHOOD REACTION  Abilify [Aripiprazole] Rash    Past Medical History:  Diagnosis Date   Abnormal mammogram, unspecified 04/19/2011   Allergy    Anxiety    Backache, unspecified 01/21/2005   Cervicalgia 08/20/2013   Chronic kidney disease, stage II (mild) 04/03/2009   Chronic rhinitis 09/28/2010   DDD (degenerative disc disease)    Depression    Dermatophytosis of nail 01/21/2005   Diverticulosis of colon (without mention of hemorrhage) 06/17/2005   Edema 11/26/2004   GERD (gastroesophageal reflux disease)    Hiatal hernia    High cholesterol    Hyperglycemia 11/18/2014   Hypertension    Insomnia, unspecified 10/18/2011   Left inguinal hernia 04/24/2014   Nocturia 09/19/2008   Osteoporosis, senile 11/18/2014   Other abnormal blood chemistry 04/03/2007   Pain in joint, pelvic region and thigh 11/12/2011   Pain in joint, shoulder region 08/15/2006   Pain in limb 08/26/2005   Palpitations 09/29/2009   PAOD (peripheral arterial occlusive disease) ([place]) 11/18/2014   Right leg; diminished popliteal, dorsalis pedis, and posterior tibial artery  pulsations    Postmenopausal atrophic vaginitis 12/14/2004   Scoliosis    Senile osteoporosis 12/14/2004   Tension headache 10/04/2005   Unspecified cataract 11/26/2004   Unspecified constipation 01/21/2005   Unspecified urinary incontinence 11/26/2004   Vaginal stenosis 11/18/2014   Tight band about 1/2 inches into the vagina which will not allow passage of my index finger.     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  Tobacco Use   Smoking status: Never   Smokeless tobacco: Never  Vaping Use   Vaping Use: Never used  Substance and Sexual Activity   Alcohol use: Not Currently    Alcohol/week: 0.0 standard drinks    Comment: one glass of wine in evening   Drug use: No   Sexual activity: Never  Other Topics Concern   Not on file  Social History Narrative   Lives at [facility] since 04/14/2005   Widowed (husband expired 2014)   Has Living Will, POA, MOST   Exercise: water aerobic  5 times a week   Walks with walker   Never smoked   Drinks moderate amount of caffeinate beverages daily   Alcohol none      Social Determinants of Radio broadcast assistant Strain: Not on file  Food Insecurity: Not on file  Transportation Needs: Not on file  Physical Activity: Not on file  Stress: Not on file  Social Connections: Not on file  Intimate Partner Violence: Not on file    Past Medical History, Surgical history, Social history, and Family history were reviewed and  updated as appropriate.   Please see review of systems for further details on the patient's review from today.   Objective:   Physical Exam:  There  were no vitals taken for this visit.  Physical Exam Neurological:     Mental Status: She is alert and oriented to person, place, and time.     Cranial Nerves: No dysarthria.  Psychiatric:        Attention and Perception: Attention and perception normal.        Mood and Affect: Mood normal.        Speech: Speech normal.        Behavior: Behavior is cooperative.        Thought Content: Thought content normal. Thought content is not paranoid or delusional. Thought content does not include homicidal or suicidal ideation. Thought content does not include homicidal or suicidal plan.        Cognition and Memory: Cognition and memory normal.        Judgment: Judgment normal.     Comments: Insight intact    Lab Review:     Component Value Date/Time   NA 138 03/08/2021 0000   NA 143 07/10/2017 0000   K 4.3 03/08/2021 0000   K 4.2 07/10/2017 0000   CL 99 03/08/2021 0000   CL 109 07/10/2017 0000   CO2 27 (A) 03/08/2021 0000   CO2 26 07/10/2017 0000   GLUCOSE 86 01/18/2018 0831   BUN 19 03/08/2021 0000   CREATININE 0.9 03/08/2021 0000   CREATININE 0.93 (H) 01/18/2018 0831   CALCIUM 10.0 03/08/2021 0000   CALCIUM 9.3 07/10/2017 0000   CALCIUM 9.3 07/10/2017 0000   PROT 6.5 01/18/2018 0831   PROT 5.8 07/10/2017 0000   PROT 5.8 07/10/2017 0000   ALBUMIN 4.2 03/08/2021 0000   ALBUMIN 3.5 07/10/2017 0000   ALBUMIN 3.5 07/10/2017 0000   AST 17 03/08/2021 0000   AST 18 07/10/2017 0000   ALT 12 03/08/2021 0000   ALT 13 07/10/2017 0000   ALKPHOS 65 03/08/2021 0000   ALKPHOS 60 07/10/2017 0000   BILITOT 0.3 01/18/2018 0831   BILITOT 0.2 07/10/2017 0000   GFRNONAA 59 11/14/2019 0000   GFRNONAA 54 (L) 01/18/2018 0831   GFRAA 69 11/14/2019 0000   GFRAA 63 01/18/2018 0831       Component Value Date/Time   WBC 7.1 03/08/2021 0000    WBC 5.1 08/04/2017 0000   RBC 4.39 03/08/2021 0000   HGB 12.8 03/08/2021 0000   HGB 11.6 07/10/2017 0000   HCT 40 03/08/2021 0000   HCT 34 07/10/2017 0000   PLT 254 03/08/2021 0000   MCV 88.4 08/04/2017 0000   MCV 92.3 04/20/2014 1530   MCH 30.2 08/04/2017 0000   MCHC 34.1 08/04/2017 0000   RDW 12.2 08/04/2017 0000   RDW 13.0 08/28/2013 0945   LYMPHSABS 1,363 02/20/2017 0750   LYMPHSABS 1.3 08/28/2013 0945   MONOABS 528 10/17/2016 1512   EOSABS 141 02/20/2017 0750   EOSABS 0.3 08/28/2013 0945   BASOSABS 32 02/20/2017 0750   BASOSABS 0.0 08/28/2013 0945    No results found for: POCLITH, LITHIUM   No results found for: PHENYTOIN, PHENOBARB, VALPROATE, CBMZ   .res Assessment: Plan:    Plan:  1. Wellbutrin XL 300mg  daily - managed by staff 2. Lexapro 20mg  - managed by staff 3. Remeron 30mg  at bedtime - managed by staff 4. Lorazepam 0.5mg  TID as needed for anxiety. May take one additional tablet as needed - self management  Continue therapy - Cathy Showfety  RTC 3 months   Discussed care being assumed by [facility].  Patient advised to contact office with any questions,  adverse effects, or acute worsening in signs and symptoms.  Discussed potential benefits, risk, and side effects of benzodiazepines to include potential risk of tolerance and dependence, as well as possible drowsiness.  Advised patient not to drive if experiencing drowsiness and to take lowest possible effective dose to minimize risk of dependence and tolerance  Diagnoses and all orders for this visit:  Generalized anxiety disorder  Obsessive-compulsive disorder, unspecified type  Insomnia, unspecified type  Major depressive disorder, recurrent episode, moderate ([place])   Please see After Visit Summary for patient specific instructions.  No future appointments.  No orders of the defined types were placed in this encounter.     -------------------------------

## 2021-05-18 DIAGNOSIS — F411 Generalized anxiety disorder: Secondary | ICD-10-CM | POA: Diagnosis not present

## 2021-05-24 ENCOUNTER — Encounter: Payer: Self-pay | Admitting: Orthopedic Surgery

## 2021-05-24 ENCOUNTER — Non-Acute Institutional Stay: Payer: Medicare Other | Admitting: Orthopedic Surgery

## 2021-05-24 DIAGNOSIS — F339 Major depressive disorder, recurrent, unspecified: Secondary | ICD-10-CM

## 2021-05-24 DIAGNOSIS — E785 Hyperlipidemia, unspecified: Secondary | ICD-10-CM | POA: Diagnosis not present

## 2021-05-24 DIAGNOSIS — F411 Generalized anxiety disorder: Secondary | ICD-10-CM | POA: Diagnosis not present

## 2021-05-24 DIAGNOSIS — K219 Gastro-esophageal reflux disease without esophagitis: Secondary | ICD-10-CM | POA: Diagnosis not present

## 2021-05-24 DIAGNOSIS — I10 Essential (primary) hypertension: Secondary | ICD-10-CM

## 2021-05-24 DIAGNOSIS — H524 Presbyopia: Secondary | ICD-10-CM

## 2021-05-24 DIAGNOSIS — M159 Polyosteoarthritis, unspecified: Secondary | ICD-10-CM

## 2021-05-24 NOTE — Progress Notes (Signed)
Location:   [place] Room Number: [place] of Service:  ALF [phone]) Provider:  Windell Moulding, NP    Patient Care Team: Virgie Dad, MD as PCP - General (Internal Medicine) Melina Modena, [facility] Clent Jacks, MD as Consulting Physician (Ophthalmology) Milus Banister, MD as Consulting Physician (Gastroenterology) Norma Fredrickson, MD as Consulting Physician (Psychiatry) Jessy Oto, MD as Consulting Physician (Orthopedic Surgery) Bjorn Loser, MD as Consulting Physician (Urology)  Extended Emergency Contact Information Primary Emergency Contact: [facility] Phone: [phone] Relation: Son Secondary Emergency Contact: [place], [place] [address] Johnnette Litter of [place] Phone: [phone] Mobile Phone: [phone] Relation: Son  Code Status:  FULL CODE Goals of care: Advanced Directive information Advanced Directives 05/24/2021  Does Patient Have a Medical Advance Directive? Yes  Type of Paramedic of [place];Living will;Out of facility DNR (pink MOST or yellow form)  Does patient want to make changes to medical advance directive? No - Patient declined  Copy of [place] in Chart? Yes - validated most recent copy scanned in chart (See row information)  Would patient like information on creating a medical advance directive? -  Pre-existing out of facility DNR order (yellow form or pink MOST form) -     Chief Complaint  Patient presents with   Medical Management of Chronic Issues    Routine Visit.    Immunizations    Discuss the need for Tetanus vaccine, and Covid Booster.     HPI:  Pt is a 88 y.o. female seen today for medical management of chronic diseases.    She currently resides on the assisted living unit at [facility]. PMH: HTN, peripheral occlusive disease, dysphagia, esophageal stricture, GERD, DDD, osteoporosis, OAB, constipation, and generalized  anxiety.   Presbyopia- followed by Dr. Katy Fitch, she reports more trouble watching television and reading, describes blurred vision, she was scheduled to have eye exam 12/2020 but appointment was cancelled, would like assistance scheduling appointment GAD- followed by [place], continues to be feel anxious throughout day, denies panic attacks, sleeping well at night, her therapist is retiring within next year, she is considering switching to [place], remains on Wellbutrin, Lexapro, Remeron and Lorazepam Depression- see above, denies mood changes HTN- BUN/creat 19/0.9 03/08/2021, remains on nifedipine HLD- LDL 86 08/11/2020, remains on pravastatin GERD- hgb 12.9 08/11/2020, remains on Protonix OA- increased right shoulder pain after 5 sessions with OT, reports no pain prior to sessions, using Biofreeze prn for pain, remains on tylenol and meloxicam prn  No recent falls or injuries. Ambulates well with walker.   Recent blood pressures:  01/04- 155/89  12/28- 122/69  12/21- 137/80  12/14- 154/87  Recent weights:  01/05- 143.8 lbs  12/04- 144.6 lbs  11/01- 144.8 lbs      Past Medical History:  Diagnosis Date   Abnormal mammogram, unspecified 04/19/2011   Allergy    Anxiety    Backache, unspecified 01/21/2005   Cervicalgia 08/20/2013   Chronic kidney disease, stage II (mild) 04/03/2009   Chronic rhinitis 09/28/2010   DDD (degenerative disc disease)    Depression    Dermatophytosis of nail 01/21/2005   Diverticulosis of colon (without mention of hemorrhage) 06/17/2005   Edema 11/26/2004   GERD (gastroesophageal reflux disease)    Hiatal hernia    High cholesterol    Hyperglycemia 11/18/2014   Hypertension    Insomnia, unspecified 10/18/2011   Left  inguinal hernia 04/24/2014   Nocturia 09/19/2008   Osteoporosis, senile 11/18/2014   Other abnormal blood chemistry 04/03/2007   Pain in joint, pelvic region and thigh 11/12/2011   Pain in joint, shoulder region 08/15/2006    Pain in limb 08/26/2005   Palpitations 09/29/2009   PAOD (peripheral arterial occlusive disease) ([place]) 11/18/2014   Right leg; diminished popliteal, dorsalis pedis, and posterior tibial artery pulsations    Postmenopausal atrophic vaginitis 12/14/2004   Scoliosis    Senile osteoporosis 12/14/2004   Tension headache 10/04/2005   Unspecified cataract 11/26/2004   Unspecified constipation 01/21/2005   Unspecified urinary incontinence 11/26/2004   Vaginal stenosis 11/18/2014   Tight band about 1/2 inches into the vagina which will not allow passage of my index finger.    Past Surgical History:  Procedure Laterality Date   ABDOMINAL HYSTERECTOMY  1969   Dr.Braun    bilateral eyelid surgery  2005   Dr.Scott   BILATERAL OOPHORECTOMY  2002   BREAST SURGERY     benign tumor removal, left breast 1969   CATARACT EXTRACTION     Right   CHOLECYSTECTOMY  1990   Dr.Moore    COLONOSCOPY  3/18/20008   inflammation, diverticular associated colitis -Dr. Ardis Hughs   COSMETIC SURGERY     ESOPHAGOGASTRODUODENOSCOPY N/A 01/24/2014   Procedure: ESOPHAGOGASTRODUODENOSCOPY (EGD);  Surgeon: Irene Shipper, MD;  Location: [facility] END[facility]COPY;  Service: Endoscopy;  Laterality: N/A;   ESOPHAGOGASTRODUODENOSCOPY ENDOSCOPY  04/04/14   Dr. Ardis Hughs   EYE SURGERY     FOREIGN BODY REMOVAL N/A 01/24/2014   Procedure: FOREIGN BODY REMOVAL;  Surgeon: Irene Shipper, MD;  Location: [place];  Service: Endoscopy;  Laterality: N/A;   HIP PINNING,CANNULATED  11/07/2011   Procedure: CANNULATED HIP PINNING;  Surgeon: Jessy Oto, MD;  Location: [facility];  Service: Orthopedics;  Laterality: Left;   ORIF FEMORAL NECK FRACTURE W/ DHS  11/07/2011   Dr.Nitka    TONSILLECTOMY     Dr.Joe Tamala Julian   TUBAL LIGATION     VAGINAL PROLAPSE REPAIR  2002   Dr.Horback     Allergies  Allergen Reactions   Brexpiprazole Swelling   Lamictal [Lamotrigine] Swelling    Lip swelling   Penicillins Other (See Comments)    CHILDHOOD REACTION   Abilify  [Aripiprazole] Rash    Allergies as of 05/24/2021       Reactions   Brexpiprazole Swelling   Lamictal [lamotrigine] Swelling   Lip swelling   Penicillins Other (See Comments)   CHILDHOOD REACTION   Abilify [aripiprazole] Rash        Medication List        Accurate as of May 24, 2021  9:37 AM. If you have any questions, ask your nurse or doctor.          STOP taking these medications    senna 8.6 MG Tabs tablet Commonly known as: SENOKOT Stopped by: Yvonna Alanis, NP       TAKE these medications    acetaminophen-codeine 300-30 MG tablet Commonly known as: TYLENOL #3 Take 1 tablet by mouth every 6 (six) hours as needed for moderate pain.   Acetylcysteine 600 MG Caps Take 1 capsule by mouth daily.   antiseptic oral rinse Liqd 15 mLs by Mouth Rinse route every 4 (four) hours as needed for dry mouth. Resident may self administer and keep in room   aspirin 81 MG chewable tablet Chew 81 mg by mouth daily.   buPROPion 300 MG 24 hr tablet  Commonly known as: WELLBUTRIN XL Take 1 tablet (300 mg total) by mouth daily.   Calcium Carb-Cholecalciferol 600-800 MG-UNIT Tabs Take 1 tablet by mouth 2 (two) times daily.   chlorpheniramine-HYDROcodone 10-8 MG/5ML Suer Commonly known as: TUSSIONEX Take 5 mLs by mouth. Give 5 ml q12H PRN cough, Resident may self administer and keep in room   cholecalciferol 1000 units tablet Commonly known as: VITAMIN D Take 1,000 Units by mouth daily.   clonazePAM 0.5 MG tablet Commonly known as: KLONOPIN Take by mouth 4 (four) times daily as needed for anxiety. Take 1/2 tablet up to 4 times daily as needed for anxiety.   docusate sodium 100 MG capsule Commonly known as: Colace Take 1 capsule (100 mg total) by mouth daily.   econazole nitrate 1 % cream Apply topically. apply once daily until till resolved, may self administer, may keep at bedside   escitalopram 20 MG tablet Commonly known as: LEXAPRO Take 1 tablet (20 mg total)  by mouth daily.   GLUCOSAMINE HCL PO Take 2,000 mg by mouth 2 (two) times daily.   hydrocortisone 2.5 % ointment Apply 1 application topically 2 (two) times daily.   LORazepam 0.5 MG tablet Commonly known as: ATIVAN Take one tablet three times daily as needed for anxiety and take one extra tablet as needed for severe anxiety.   lubiprostone 8 MCG capsule Commonly known as: AMITIZA Take 8 mcg by mouth 2 (two) times daily with a meal.   meloxicam 7.5 MG tablet Commonly known as: MOBIC TAKE 1 TABLET BY MOUTH EVERY DAY AS NEEDED FOR ARTHRITIS   Menthol (Topical Analgesic) 4 % Gel Apply 1 application topically as needed. Resident may self administer and keep in room   miconazole 2 % powder Commonly known as: MICOTIN Apply topically as needed for itching. apply to legs and feet once a day   mirtazapine 30 MG tablet Commonly known as: REMERON Take 30 mg by mouth at bedtime.   multivitamin with minerals Tabs tablet Take 1 tablet by mouth daily.   NIFEdipine 60 MG 24 hr tablet Commonly known as: PROCARDIA XL/NIFEDICAL XL TAKE 1 TABLET BY MOUTH EVERY DAY TO CONTROL BLOOD PRESSURE   OcuSoft Eyelid Cleansing Pads Apply 1 application topically daily.   pantoprazole 40 MG tablet Commonly known as: PROTONIX One daily to reduce stomach acid   PEPTO-BISMOL PO Take 30 mg by mouth as needed. For diarrhea   polyethylene glycol 17 g packet Commonly known as: MIRALAX / GLYCOLAX Take 17 g by mouth daily. Mix 17g  with 8 oz. of fluid daily for constipation   pravastatin 10 MG tablet Commonly known as: PRAVACHOL Take 10 mg by mouth daily.   PRESERVISION AREDS 2+MULTI VIT PO Take 1 tablet by mouth 2 (two) times daily.   Systane 0.4-0.3 % Gel ophthalmic gel Generic drug: Polyethyl Glycol-Propyl Glycol Place 1 application into both eyes in the morning, at noon, in the evening, and at bedtime.   Systane 0.4-0.3 % Gel ophthalmic gel Generic drug: Polyethyl Glycol-Propyl  Glycol Place 1 application into both eyes as needed.   Systane 0.4-0.3 % Soln Generic drug: Polyethyl Glycol-Propyl Glycol Apply 1 drop to eye 2 (two) times daily as needed.        Review of Systems  Constitutional:  Negative for activity change, appetite change, chills and fatigue.  HENT:  Negative for congestion, hearing loss and trouble swallowing.   Eyes:  Positive for visual disturbance.  Respiratory:  Negative for cough, shortness of breath and  wheezing.   Cardiovascular:  Negative for chest pain and leg swelling.  Gastrointestinal:  Negative for abdominal distention, abdominal pain, constipation, diarrhea and nausea.  Genitourinary:  Negative for dysuria, frequency and hematuria.  Musculoskeletal:  Positive for arthralgias and gait problem.  Skin:  Negative for wound.  Neurological:  Positive for weakness. Negative for dizziness and headaches.  Psychiatric/Behavioral:  Positive for dysphoric mood. Negative for confusion and sleep disturbance. The patient is nervous/anxious.    Immunization History  Administered Date(s) Administered   Influenza Whole 02/14/2012, 02/14/2013   Influenza, High Dose Seasonal PF 03/14/2017, 02/26/2019   Influenza-Unspecified 02/27/2014, 02/12/2015, 02/25/2016, 03/15/2017, 02/19/2018, 02/18/2020, 03/09/2021   Moderna SARS-COV2 Booster Vaccination 10/21/2020   Moderna Sars-Covid-2 Vaccination 05/20/2019, 06/17/2019, 03/30/2020   PFIZER(Purple Top)SARS-COV-2 Vaccination 02/03/2021   Pneumococcal Conjugate-13 07/13/2015   Pneumococcal Polysaccharide-23 05/16/1998   Td 05/16/2004   Zoster Recombinat (Shingrix) 02/20/2018, 05/11/2018, 07/16/2018   Zoster, Live 05/16/2005   Pertinent  Health Maintenance Due  Topic Date Due   INFLUENZA VACCINE  Completed   DEXA SCAN  Completed   MAMMOGRAM  Discontinued   Fall Risk 10/17/2016 04/21/2017 04/05/2018 04/25/2018 04/09/2019  Falls in the past year? No No 0 0 0  Was there an injury with Fall? - - - 0 -   Fall Risk Category Calculator - - - 0 -  Fall Risk Category - - - Low -   Functional Status Survey:    Vitals:   05/24/21 0922  BP: (!) 155/89  Pulse: 83  Resp: 18  Temp: 97.7 F (36.5 C)  SpO2: 92%  Weight: 148 lb 12.8 oz (67.5 kg)  Height: 5\' 2"  (1.575 m)   Body mass index is 27.22 kg/m. Physical Exam Vitals reviewed.  Constitutional:      General: She is not in acute distress. HENT:     Head: Normocephalic.     Right Ear: There is no impacted cerumen.     Left Ear: There is no impacted cerumen.     Nose: Nose normal.     Mouth/Throat:     Mouth: Mucous membranes are moist.  Eyes:     General:        Right eye: No discharge.        Left eye: No discharge.  Neck:     Vascular: No carotid bruit.  Cardiovascular:     Rate and Rhythm: Normal rate and regular rhythm.     Pulses: Normal pulses.     Heart sounds: Normal heart sounds.  Pulmonary:     Effort: Pulmonary effort is normal. No respiratory distress.     Breath sounds: Normal breath sounds. No wheezing.  Abdominal:     General: Bowel sounds are normal. There is no distension.     Palpations: Abdomen is soft.     Tenderness: There is no abdominal tenderness.  Musculoskeletal:     Cervical back: Normal range of motion.     Right lower leg: No edema.     Left lower leg: No edema.  Lymphadenopathy:     Cervical: No cervical adenopathy.  Skin:    General: Skin is warm and dry.     Capillary Refill: Capillary refill takes less than 2 seconds.  Neurological:     General: No focal deficit present.     Mental Status: She is alert and oriented to person, place, and time.     Motor: Weakness present.     Gait: Gait abnormal.     Comments: walker  Psychiatric:        Mood and Affect: Mood normal. Mood is not anxious or depressed.        Behavior: Behavior normal.    Labs reviewed: Recent Labs    08/11/20 0000 12/01/20 0000 03/08/21 0000  NA 140 138 138  K 4.5 4.1 4.3  CL 103 101 99  CO2 24* 27*  27*  BUN 22* 26* 19  CREATININE 0.9 1.0 0.9  CALCIUM 9.9 9.7 10.0   Recent Labs    08/11/20 0000 12/01/20 0000 03/08/21 0000  AST 19 17 17   ALT 12 12 12   ALKPHOS 63 64 65  ALBUMIN 4.1 4.0 4.2   Recent Labs    08/11/20 0000 12/01/20 0000 03/08/21 0000  WBC 6.6 5.2 7.1  NEUTROABS  --   --  4,416.00  HGB 12.9 12.7 12.8  HCT 40 39 40  PLT 219 202 254   Lab Results  Component Value Date   TSH 2.10 03/08/2021   Lab Results  Component Value Date   HGBA1C 5.5 09/28/2018   Lab Results  Component Value Date   CHOL 164 03/08/2021   HDL 56 03/08/2021   LDLCALC 84 03/08/2021   TRIG 139 03/08/2021   CHOLHDL 3.5 08/04/2017    Significant Diagnostic Results in last 30 days:  No results found.  Assessment/Plan 1. Presbyopia of both eyes - reports blurred vision at times - cancelled appointment 12/2020 - recommend rescheduling with Dr. Katy Fitch- nurse aware  2. GAD (generalized anxiety disorder) - no recent panic attacks - cont Lexapro and ativan  3. Major depression, recurrent, chronic (HCC) - no recent mood changes - followed by [facility]- therapist retiring soon - considering switch to [facility] in future - cont Wellbutrin and Remeron  4. Primary hypertension - controlled - cont nifedipine  5. Hyperlipidemia LDL goal <100 - LDL at goal - cont pravastatin  6. Gastroesophageal reflux disease without esophagitis - hgb stable  - cont Protonix  7. Primary osteoarthritis involving multiple joints - increased right shoulder pain after 5 sessions of OT - cont tylenol and meloxicam prn for pain  Total time: 31 minutes. Greater than 50%of total time spent doing health maintenance and symptom/medication management.    Family/ staff Communication: plan discussed with patient and nurse  Labs/tests ordered: none

## 2021-06-11 ENCOUNTER — Non-Acute Institutional Stay (INDEPENDENT_AMBULATORY_CARE_PROVIDER_SITE_OTHER): Payer: Medicare Other | Admitting: Orthopedic Surgery

## 2021-06-11 ENCOUNTER — Encounter: Payer: Self-pay | Admitting: Orthopedic Surgery

## 2021-06-11 DIAGNOSIS — Z0000 Encounter for general adult medical examination without abnormal findings: Secondary | ICD-10-CM

## 2021-06-11 NOTE — Patient Instructions (Signed)
°  Tina Hampton , Thank you for taking time to come for your Medicare Wellness Visit. I appreciate your ongoing commitment to your health goals. Please review the following plan we discussed and let me know if I can assist you in the future.   These are the goals we discussed:  Goals      Anxiety Symptoms Monitored and Managed     Evidence-based guidance:  Discuss adherence to medication therapy; assess and manage barriers, such as costs, side effects, feeling little or no improvement, stigma or low motivation.  Discuss past experiences with psychotropic medication, potential side effects and need to continue medication until treatment becomes effective (e.g., 4 to 8 weeks).  Explain the interplay of stress and physical symptoms, as well as the relationship between illness and emotional problems.  Explore complementary and alternative therapy options, such as applied relaxation, mindfulness, meditation, music therapy, aromatherapy, acupuncture or massage.  Prepare patient for antidepressant pharmacologic therapy which may include additional short-term medications until maintenance medications demonstrate therapeutic effect.   Assess response; monitor and manage side effects.  Prepare patient for use of pharmacologic therapy (e.g., anxiolytic, selective serotonin-reuptake inhibitor, serotonin norepinephrine-reuptake inhibitor, tricyclic antidepressant, antiepileptic, antipsychotic for comorbid depression,   phosphodiesterase-inhibitor for sexual dysfunction, sedative or hypnotic for sleep disturbance); monitor and manage side effects.  Prepare patient for long-term pharmacologic therapy to prevent relapse (e.g., 12 months after symptom improvement).  Promote cognitive behavioral therapy, individualized to severity of symptoms [e.g., nonfacilitated self-help, facilitated (computerized or manual-based) self-help, face-to-face individual treatment].  Encourage patient to develop a self-management plan  that identifies and manages lifestyle triggers (e.g., caffeine, stimulants, nicotine, stress), improves sleep, exercise or physical activity based on tolerance.  Prepare patient for a referral to a psychiatrist when patient has a poor response to treatment, atypical presentation or comorbid psychiatric disorder.  Provide frequent follow-up (e.g., every 2 weeks for the first 6 to 8 weeks of treatment and monthly thereafter).  Explore means to support work reintegration, such as modified working hours, peer support or coaching or a community jobs program.   Notes:         This is a list of the screening recommended for you and due dates:  Health Maintenance  Topic Date Due   COVID-19 Vaccine (5 - Booster for Moderna series) 06/27/2021*   Tetanus Vaccine  06/11/2022*   Pneumonia Vaccine  Completed   Flu Shot  Completed   DEXA scan (bone density measurement)  Completed   Zoster (Shingles) Vaccine  Completed   HPV Vaccine  Aged Out   Mammogram  Discontinued  *Topic was postponed. The date shown is not the original due date.

## 2021-06-11 NOTE — Progress Notes (Signed)
Subjective:   Tina Hampton is a 88 y.o. female who presents for Medicare Annual (Subsequent) preventive examination.  Review of Systems     Cardiac Risk Factors include: advanced age (>36men, >8 women);sedentary lifestyle;hypertension     Objective:    Today's Vitals   06/11/21 1132  BP: 130/70  Pulse: 74  Resp: 18  Temp: (!) 97 F (36.1 C)  SpO2: 96%  Weight: 143 lb 12.8 oz (65.2 kg)  Height: 5\' 2"  (1.575 m)   Body mass index is 26.3 kg/m.  Advanced Directives 06/11/2021 05/24/2021 03/30/2021 02/25/2021 11/27/2020 09/01/2020 08/06/2020  Does Patient Have a Medical Advance Directive? Yes Yes Yes Yes Yes Yes Yes  Type of Paramedic of [place];Living will [place];Living will Living will;Out of facility DNR (pink MOST or yellow form);Healthcare Power of Attorney Living will;Out of facility DNR (pink MOST or yellow form);Healthcare Power of [place];Living will;Out of facility DNR (pink MOST or yellow form) [place];Living will [place];Living will  Does patient want to make changes to medical advance directive? No - Patient declined No - Patient declined No - Patient declined No - Patient declined No - Patient declined No - Patient declined No - Patient declined  Copy of [place] in Chart? Yes - validated most recent copy scanned in chart (See row information) Yes - validated most recent copy scanned in chart (See row information) Yes - validated most recent copy scanned in chart (See row information) Yes - validated most recent copy scanned in chart (See row information) Yes - validated most recent copy scanned in chart (See row information) Yes - validated most recent copy scanned in chart (See row information) Yes - validated most recent copy scanned in chart (See row information)  Would patient like information on creating a medical advance  directive? - - - - - - No - Patient declined  Pre-existing out of facility DNR order (yellow form or pink MOST form) - - Pink MOST form placed in chart (order not valid for inpatient use);Yellow form placed in chart (order not valid for inpatient use) Pink MOST form placed in chart (order not valid for inpatient use);Yellow form placed in chart (order not valid for inpatient use) Pink MOST form placed in chart (order not valid for inpatient use) - -    Current Medications (verified) Outpatient Encounter Medications as of 06/11/2021  Medication Sig   acetaminophen-codeine (TYLENOL #3) 300-30 MG tablet Take 1 tablet by mouth every 6 (six) hours as needed for moderate pain.   Acetylcysteine 600 MG CAPS Take 1 capsule by mouth daily.   antiseptic oral rinse (BIOTENE) LIQD 15 mLs by Mouth Rinse route every 4 (four) hours as needed for dry mouth. Resident may self administer and keep in room   aspirin 81 MG chewable tablet Chew 81 mg by mouth daily.   Bismuth Subsalicylate (PEPTO-BISMOL PO) Take 30 mg by mouth as needed. For diarrhea   buPROPion (WELLBUTRIN XL) 300 MG 24 hr tablet Take 1 tablet (300 mg total) by mouth daily.   Calcium Carb-Cholecalciferol 600-800 MG-UNIT TABS Take 1 tablet by mouth 2 (two) times daily.   chlorpheniramine-HYDROcodone (TUSSIONEX) 10-8 MG/5ML SUER Take 5 mLs by mouth. Give 5 ml q12H PRN cough, Resident may self administer and keep in room   cholecalciferol (VITAMIN D) 1000 UNITS tablet Take 1,000 Units by mouth daily.   clonazePAM (KLONOPIN) 0.5 MG tablet Take by mouth  4 (four) times daily as needed for anxiety. Take 1/2 tablet up to 4 times daily as needed for anxiety.   docusate sodium (COLACE) 100 MG capsule Take 1 capsule (100 mg total) by mouth daily.   econazole nitrate 1 % cream Apply topically. apply once daily until till resolved, may self administer, may keep at bedside   escitalopram (LEXAPRO) 20 MG tablet Take 1 tablet (20 mg total) by mouth daily.   Eyelid  Cleansers (OCUSOFT EYELID CLEANSING) PADS Apply 1 application topically daily.   GLUCOSAMINE HCL PO Take 2,000 mg by mouth 2 (two) times daily.   hydrocortisone 2.5 % ointment Apply 1 application topically 2 (two) times daily.   LORazepam (ATIVAN) 0.5 MG tablet Take one tablet three times daily as needed for anxiety and take one extra tablet as needed for severe anxiety.   lubiprostone (AMITIZA) 8 MCG capsule Take 8 mcg by mouth 2 (two) times daily with a meal.   meloxicam (MOBIC) 7.5 MG tablet TAKE 1 TABLET BY MOUTH EVERY DAY AS NEEDED FOR ARTHRITIS   Menthol, Topical Analgesic, 4 % GEL Apply 1 application topically as needed. Resident may self administer and keep in room   miconazole (MICOTIN) 2 % powder Apply topically as needed for itching. apply to legs and feet once a day   mirtazapine (REMERON) 30 MG tablet Take 30 mg by mouth at bedtime.    Multiple Vitamin (MULTIVITAMIN WITH MINERALS) TABS Take 1 tablet by mouth daily.   Multiple Vitamins-Minerals (PRESERVISION AREDS 2+MULTI VIT PO) Take 1 tablet by mouth 2 (two) times daily.   NIFEdipine (PROCARDIA XL/ADALAT-CC) 60 MG 24 hr tablet TAKE 1 TABLET BY MOUTH EVERY DAY TO CONTROL BLOOD PRESSURE   pantoprazole (PROTONIX) 40 MG tablet One daily to reduce stomach acid   Polyethyl Glycol-Propyl Glycol (SYSTANE) 0.4-0.3 % GEL ophthalmic gel Place 1 application into both eyes in the morning, at noon, in the evening, and at bedtime.   Polyethyl Glycol-Propyl Glycol (SYSTANE) 0.4-0.3 % GEL ophthalmic gel Place 1 application into both eyes as needed.   Polyethyl Glycol-Propyl Glycol (SYSTANE) 0.4-0.3 % SOLN Apply 1 drop to eye 2 (two) times daily as needed.   polyethylene glycol (MIRALAX / GLYCOLAX) 17 g packet Take 17 g by mouth daily. Mix 17g  with 8 oz. of fluid daily for constipation   pravastatin (PRAVACHOL) 10 MG tablet Take 10 mg by mouth daily.   No facility-administered encounter medications on file as of 06/11/2021.    Allergies  (verified) Brexpiprazole, Lamictal [lamotrigine], Penicillins, and Abilify [aripiprazole]   History: Past Medical History:  Diagnosis Date   Abnormal mammogram, unspecified 04/19/2011   Allergy    Anxiety    Backache, unspecified 01/21/2005   Cervicalgia 08/20/2013   Chronic kidney disease, stage II (mild) 04/03/2009   Chronic rhinitis 09/28/2010   DDD (degenerative disc disease)    Depression    Dermatophytosis of nail 01/21/2005   Diverticulosis of colon (without mention of hemorrhage) 06/17/2005   Edema 11/26/2004   GERD (gastroesophageal reflux disease)    Hiatal hernia    High cholesterol    Hyperglycemia 11/18/2014   Hypertension    Insomnia, unspecified 10/18/2011   Left inguinal hernia 04/24/2014   Nocturia 09/19/2008   Osteoporosis, senile 11/18/2014   Other abnormal blood chemistry 04/03/2007   Pain in joint, pelvic region and thigh 11/12/2011   Pain in joint, shoulder region 08/15/2006   Pain in limb 08/26/2005   Palpitations 09/29/2009   PAOD (peripheral arterial occlusive disease) (  [place]) 11/18/2014   Right leg; diminished popliteal, dorsalis pedis, and posterior tibial artery pulsations    Postmenopausal atrophic vaginitis 12/14/2004   Scoliosis    Senile osteoporosis 12/14/2004   Tension headache 10/04/2005   Unspecified cataract 11/26/2004   Unspecified constipation 01/21/2005   Unspecified urinary incontinence 11/26/2004   Vaginal stenosis 11/18/2014   Tight band about 1/2 inches into the vagina which will not allow passage of my index finger.    Past Surgical History:  Procedure Laterality Date   ABDOMINAL HYSTERECTOMY  1969   Dr.Braun    bilateral eyelid surgery  2005   Dr.Scott   BILATERAL OOPHORECTOMY  2002   BREAST SURGERY     benign tumor removal, left breast 1969   CATARACT EXTRACTION     Right   CHOLECYSTECTOMY  1990   Dr.Moore    COLONOSCOPY  3/18/20008   inflammation, diverticular associated colitis -Dr. Ardis Hughs   COSMETIC SURGERY     ESOPHAGOGASTRODUODENOSCOPY N/A  01/24/2014   Procedure: ESOPHAGOGASTRODUODENOSCOPY (EGD);  Surgeon: Irene Shipper, MD;  Location: [facility] ENDOSCOPY;  Service: Endoscopy;  Laterality: N/A;   ESOPHAGOGASTRODUODENOSCOPY ENDOSCOPY  04/04/14   Dr. Ardis Hughs   EYE SURGERY     FOREIGN BODY REMOVAL N/A 01/24/2014   Procedure: FOREIGN BODY REMOVAL;  Surgeon: Irene Shipper, MD;  Location: [facility];  Service: Endoscopy;  Laterality: N/A;   HIP PINNING,CANNULATED  11/07/2011   Procedure: CANNULATED HIP PINNING;  Surgeon: Jessy Oto, MD;  Location: [facility] ORS;  Service: Orthopedics;  Laterality: Left;   ORIF FEMORAL NECK FRACTURE W/ [facility]  11/07/2011   Dr.Nitka    TONSILLECTOMY     Dr.Joe Tamala Julian   TUBAL LIGATION     VAGINAL PROLAPSE REPAIR  2002   Dr.Horback    Family History  Problem Relation Age of Onset   Heart disease Mother    Stroke Mother    Emphysema Father    Arthritis Father    Alcohol abuse Brother    Heart disease Son    Leukemia Son    Esophageal cancer Neg Hx    Stomach cancer Neg Hx    Colon cancer Neg Hx    Social History   Socioeconomic History   Marital status: Widowed    Spouse name: Not on file   Number of children: Not on file   Years of education: Not on file   Highest education level: Not on file  Occupational History   Occupation: retired Nurse, 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  Tobacco Use   Smoking status: Never   Smokeless tobacco: Never  Vaping Use   Vaping Use: Never used  Substance and Sexual Activity   Alcohol use: Not Currently    Alcohol/week: 0.0 standard drinks    Comment: one glass of wine in evening   Drug use: No   Sexual activity: Never  Other Topics Concern   Not on file  Social History Narrative   [facility] since 04/[facility]   Widowed (husband expired 2014)   Has Living Will, POA, MOST   Exercise: water aerobic  5 times a week   Walks with walker   Never smoked   Drinks moderate amount of caffeinate beverages daily   Alcohol none      Social Determinants of  Health   Financial Resource Strain: Low Risk    Difficulty of Paying Living Expenses: Not hard at all  Food Insecurity: No Food Insecurity   Worried About Running Out of  Food in the Last Year: Never true   [place] in the Last Year: Never true  Transportation Needs: No Transportation Needs   Lack of Transportation (Medical): No   Lack of Transportation (Non-Medical): No  Physical Activity: Insufficiently Active   Days of Exercise per Week: 3 days   Minutes of Exercise per Session: 10 min  Stress: Stress Concern Present   Feeling of Stress : To some extent  Social Connections: Socially Isolated   Frequency of Communication with Friends and Family: Three times a week   Frequency of Social Gatherings with Friends and Family: Three times a week   Attends Religious Services: Never   Active Member of Clubs or Organizations: No   Attends Archivist Meetings: Never   Marital Status: Widowed    Tobacco Counseling Counseling given: Not Answered   Clinical Intake:  Pre-visit preparation completed: No  Pain : No/denies pain     BMI - recorded: 26.3 Nutritional Status: BMI 25 -29 Overweight Nutritional Risks: None Diabetes: No  How often do you need to have someone help you when you read instructions, pamphlets, or other written materials from your doctor or pharmacy?: 3 - Sometimes What is the last grade level you completed in school?: 4 years college  Diabetic?No  Interpreter Needed?: No      Activities of Daily Living In your present state of health, do you have any difficulty performing the following activities: 06/11/2021  Hearing? N  Vision? N  Difficulty concentrating or making decisions? N  Walking or climbing stairs? Y  Dressing or bathing? N  Doing errands, shopping? Y  Preparing Food and eating ? Y  Using the Toilet? N  In the past six months, have you accidently leaked urine? Y  Do you have problems with loss of bowel control? Y  Managing  your Medications? Y  Managing your Finances? Y  Some recent data might be hidden    Patient Care Team: Virgie Dad, MD as PCP - General (Internal Medicine) Melina Modena, [facility] Clent Jacks, MD as Consulting Physician (Ophthalmology) Milus Banister, MD as Consulting Physician (Gastroenterology) Norma Fredrickson, MD as Consulting Physician (Psychiatry) Jessy Oto, MD as Consulting Physician (Orthopedic Surgery) Bjorn Loser, MD as Consulting Physician (Urology)  Indicate any recent Medical Services you may have received from other than [facility] providers in the past year (date may be approximate).     Assessment:   This is a routine wellness examination for Arnold.  Hearing/Vision screen No results found.  Dietary issues and exercise activities discussed: Current Exercise Habits: The patient does not participate in regular exercise at present, Exercise limited by: psychological condition(s) (depression, anxiety, advanced age)   Goals Addressed             This Visit's Progress    Anxiety Symptoms Monitored and Managed   On track    Evidence-based guidance:  Discuss adherence to medication therapy; assess and manage barriers, such as costs, side effects, feeling little or no improvement, stigma or low motivation.  Discuss past experiences with psychotropic medication, potential side effects and need to continue medication until treatment becomes effective (e.g., 4 to 8 weeks).  Explain the interplay of stress and physical symptoms, as well as the relationship between illness and emotional problems.  Explore complementary and alternative therapy options, such as applied relaxation, mindfulness, meditation, music therapy, aromatherapy, acupuncture or massage.  Prepare patient for antidepressant pharmacologic therapy which may include additional short-term medications until maintenance medications  demonstrate therapeutic effect.   Assess response; monitor and manage  side effects.  Prepare patient for use of pharmacologic therapy (e.g., anxiolytic, selective serotonin-reuptake inhibitor, serotonin norepinephrine-reuptake inhibitor, tricyclic antidepressant, antiepileptic, antipsychotic for comorbid depression,   phosphodiesterase-inhibitor for sexual dysfunction, sedative or hypnotic for sleep disturbance); monitor and manage side effects.  Prepare patient for long-term pharmacologic therapy to prevent relapse (e.g., 12 months after symptom improvement).  Promote cognitive behavioral therapy, individualized to severity of symptoms [e.g., nonfacilitated self-help, facilitated (computerized or manual-based) self-help, face-to-face individual treatment].  Encourage patient to develop a self-management plan that identifies and manages lifestyle triggers (e.g., caffeine, stimulants, nicotine, stress), improves sleep, exercise or physical activity based on tolerance.  Prepare patient for a referral to a psychiatrist when patient has a poor response to treatment, atypical presentation or comorbid psychiatric disorder.  Provide frequent follow-up (e.g., every 2 weeks for the first 6 to 8 weeks of treatment and monthly thereafter).  Explore means to support work reintegration, such as modified working hours, peer support or coaching or a community jobs program.   Notes:        Depression Screen PHQ 2/9 Scores 06/11/2021 04/25/2018 04/21/2017 05/31/2016 10/20/2015 09/01/2015 08/19/2015  PHQ - 2 Score 6 6 6  0 1 0 0  PHQ- 9 Score 9 10 15  - - - -    Fall Risk Fall Risk  06/11/2021 04/09/2019 04/25/2018 04/05/2018 04/21/2017  Falls in the past year? 1 0 0 0 No  Comment - Emmi Telephone Survey: data to providers prior to load - Emmi Telephone Survey: data to providers prior to load -  Number falls in past yr: 0 - 0 - -  Injury with Fall? 0 - 0 - -  Risk for fall due to : History of fall(s);Impaired balance/gait;Impaired mobility;Impaired vision - - - -  Follow up Falls  evaluation completed;Education provided - - - -    FALL RISK PREVENTION PERTAINING TO THE HOME:  Any stairs in or around the home? No  If so, are there any without handrails? No  Home free of loose throw rugs in walkways, pet beds, electrical cords, etc? Yes  Adequate lighting in your home to reduce risk of falls? Yes   ASSISTIVE DEVICES UTILIZED TO PREVENT FALLS:  Life alert? No  Use of a cane, walker or w/c? Yes  Grab bars in the bathroom? Yes  Shower chair or bench in shower? Yes  Elevated toilet seat or a handicapped toilet? Yes   TIMED UP AND GO:  Was the test performed? No .  Length of time to ambulate 10 feet: N/A sec.   Gait slow and steady with assistive device  Cognitive Function: MMSE - Mini Mental State Exam 06/11/2021 09/20/2017 05/31/2016 11/18/2014  Not completed: Refused - - -  Orientation to time - 5 5 5   Orientation to Place - 5 5 5   Registration - 3 3 3   Attention/ Calculation - 5 5 5   Recall - 3 3 3   Language- name 2 objects - 2 2 2   Language- repeat - 1 1 1   Language- follow 3 step command - 3 3 3   Language- read & follow direction - 1 1 1   Write a sentence - 1 1 1   Copy design - - 1 1  Total score - - 30 30     6CIT Screen 06/11/2021  What Year? 0 points  What month? 0 points  What time? 3 points  Count back from 20 0 points  Months in  reverse 2 points  Repeat phrase 0 points  Total Score 5    Immunizations Immunization History  Administered Date(s) Administered   Influenza Whole 02/14/2012, 02/14/2013   Influenza, High Dose Seasonal PF 03/14/2017, 02/26/2019   Influenza-Unspecified 02/27/2014, 02/12/2015, 02/25/2016, 03/15/2017, 02/19/2018, 02/18/2020, 03/09/2021   Moderna SARS-COV2 Booster Vaccination 10/21/2020   Moderna Sars-Covid-2 Vaccination 05/20/2019, 06/17/2019, 03/30/2020   PFIZER(Purple Top)SARS-COV-2 Vaccination 02/03/2021   Pneumococcal Conjugate-13 07/13/2015   Pneumococcal Polysaccharide-23 05/16/1998   Td 05/16/2004    Zoster Recombinat (Shingrix) 02/20/2018, 05/11/2018, 07/16/2018   Zoster, Live 05/16/2005    TDAP status: Due, Education has been provided regarding the importance of this vaccine. Advised may receive this vaccine at local pharmacy or Health Dept. Aware to provide a copy of the vaccination record if obtained from local pharmacy or Health Dept. Verbalized acceptance and understanding.  Flu Vaccine status: Up to date  Pneumococcal vaccine status: Up to date  Covid-19 vaccine status: Completed vaccines  Qualifies for Shingles Vaccine? Yes   Zostavax completed Yes   Shingrix Completed?: Yes  Screening Tests Health Maintenance  Topic Date Due   TETANUS/TDAP  05/16/2014   COVID-19 Vaccine (5 - Booster for Moderna series) 03/31/2021   Pneumonia Vaccine 81+ Years old  Completed   INFLUENZA VACCINE  Completed   DEXA SCAN  Completed   Zoster Vaccines- Shingrix  Completed   HPV VACCINES  Aged Out   MAMMOGRAM  Discontinued    Health Maintenance  Health Maintenance Due  Topic Date Due   TETANUS/TDAP  05/16/2014   COVID-19 Vaccine (5 - Booster for Moderna series) 03/31/2021    Colorectal cancer screening: No longer required.   Mammogram status: No longer required due to advanced age.  Bone Density status: Completed 2018. Results reflect: Bone density results: OSTEOPOROSIS. Repeat every 2 years.  Lung Cancer Screening: (Low Dose CT Chest recommended if Age 2-80 years, 30 pack-year currently smoking OR have quit w/in 15years.) does not qualify.   Lung Cancer Screening Referral: No  Additional Screening:  Hepatitis C Screening: does not qualify; advanced age  Vision Screening: Recommended annual ophthalmology exams for early detection of glaucoma and other disorders of the eye. Is the patient up to date with their annual eye exam?  Yes  Who is the provider or what is the name of the office in which the patient attends annual eye exams? Dr. Katy Fitch If pt is not established with a  provider, would they like to be referred to a provider to establish care? No .   Dental Screening: Recommended annual dental exams for proper oral hygiene  Community Resource Referral / Chronic Care Management: CRR required this visit?  No   CCM required this visit?  No      Plan:     I have personally reviewed and noted the following in the patients chart:   Medical and social history Use of alcohol, tobacco or illicit drugs  Current medications and supplements including opioid prescriptions.  Functional ability and status Nutritional status Physical activity Advanced directives List of other physicians Hospitalizations, surgeries, and ER visits in previous 12 months Vitals Screenings to include cognitive, depression, and falls Referrals and appointments  In addition, I have reviewed and discussed with patient certain preventive protocols, quality metrics, and best practice recommendations. A written personalized care plan for preventive services as well as general preventive health recommendations were provided to patient.     Yvonna Alanis, NP   06/11/2021

## 2021-06-15 DIAGNOSIS — F411 Generalized anxiety disorder: Secondary | ICD-10-CM | POA: Diagnosis not present

## 2021-06-17 ENCOUNTER — Other Ambulatory Visit: Payer: Self-pay | Admitting: *Deleted

## 2021-06-17 DIAGNOSIS — F429 Obsessive-compulsive disorder, unspecified: Secondary | ICD-10-CM

## 2021-06-17 DIAGNOSIS — F411 Generalized anxiety disorder: Secondary | ICD-10-CM

## 2021-06-17 MED ORDER — LORAZEPAM 0.5 MG PO TABS: ORAL_TABLET | ORAL | 2 refills | Status: DC

## 2021-06-17 NOTE — Telephone Encounter (Signed)
Pharmacy requested refill.  Pended Rx and sent to Amy for approval.  

## 2021-06-27 DIAGNOSIS — R41841 Cognitive communication deficit: Secondary | ICD-10-CM | POA: Diagnosis not present

## 2021-06-27 DIAGNOSIS — M1990 Unspecified osteoarthritis, unspecified site: Secondary | ICD-10-CM | POA: Diagnosis not present

## 2021-06-27 DIAGNOSIS — M6281 Muscle weakness (generalized): Secondary | ICD-10-CM | POA: Diagnosis not present

## 2021-06-27 DIAGNOSIS — R2681 Unsteadiness on feet: Secondary | ICD-10-CM | POA: Diagnosis not present

## 2021-06-29 DIAGNOSIS — M6281 Muscle weakness (generalized): Secondary | ICD-10-CM | POA: Diagnosis not present

## 2021-06-29 DIAGNOSIS — M1990 Unspecified osteoarthritis, unspecified site: Secondary | ICD-10-CM | POA: Diagnosis not present

## 2021-06-29 DIAGNOSIS — R41841 Cognitive communication deficit: Secondary | ICD-10-CM | POA: Diagnosis not present

## 2021-06-29 DIAGNOSIS — R2681 Unsteadiness on feet: Secondary | ICD-10-CM | POA: Diagnosis not present

## 2021-07-02 DIAGNOSIS — H02403 Unspecified ptosis of bilateral eyelids: Secondary | ICD-10-CM | POA: Diagnosis not present

## 2021-07-02 DIAGNOSIS — H353131 Nonexudative age-related macular degeneration, bilateral, early dry stage: Secondary | ICD-10-CM | POA: Diagnosis not present

## 2021-07-02 DIAGNOSIS — H52203 Unspecified astigmatism, bilateral: Secondary | ICD-10-CM | POA: Diagnosis not present

## 2021-07-05 DIAGNOSIS — M6281 Muscle weakness (generalized): Secondary | ICD-10-CM | POA: Diagnosis not present

## 2021-07-05 DIAGNOSIS — R41841 Cognitive communication deficit: Secondary | ICD-10-CM | POA: Diagnosis not present

## 2021-07-05 DIAGNOSIS — R2681 Unsteadiness on feet: Secondary | ICD-10-CM | POA: Diagnosis not present

## 2021-07-05 DIAGNOSIS — M1990 Unspecified osteoarthritis, unspecified site: Secondary | ICD-10-CM | POA: Diagnosis not present

## 2021-07-07 DIAGNOSIS — M1990 Unspecified osteoarthritis, unspecified site: Secondary | ICD-10-CM | POA: Diagnosis not present

## 2021-07-07 DIAGNOSIS — M6281 Muscle weakness (generalized): Secondary | ICD-10-CM | POA: Diagnosis not present

## 2021-07-07 DIAGNOSIS — R2681 Unsteadiness on feet: Secondary | ICD-10-CM | POA: Diagnosis not present

## 2021-07-07 DIAGNOSIS — R41841 Cognitive communication deficit: Secondary | ICD-10-CM | POA: Diagnosis not present

## 2021-07-09 DIAGNOSIS — R41841 Cognitive communication deficit: Secondary | ICD-10-CM | POA: Diagnosis not present

## 2021-07-09 DIAGNOSIS — R2681 Unsteadiness on feet: Secondary | ICD-10-CM | POA: Diagnosis not present

## 2021-07-09 DIAGNOSIS — M6281 Muscle weakness (generalized): Secondary | ICD-10-CM | POA: Diagnosis not present

## 2021-07-09 DIAGNOSIS — M1990 Unspecified osteoarthritis, unspecified site: Secondary | ICD-10-CM | POA: Diagnosis not present

## 2021-07-11 DIAGNOSIS — R41841 Cognitive communication deficit: Secondary | ICD-10-CM | POA: Diagnosis not present

## 2021-07-11 DIAGNOSIS — M6281 Muscle weakness (generalized): Secondary | ICD-10-CM | POA: Diagnosis not present

## 2021-07-11 DIAGNOSIS — M1990 Unspecified osteoarthritis, unspecified site: Secondary | ICD-10-CM | POA: Diagnosis not present

## 2021-07-11 DIAGNOSIS — R2681 Unsteadiness on feet: Secondary | ICD-10-CM | POA: Diagnosis not present

## 2021-07-13 DIAGNOSIS — F411 Generalized anxiety disorder: Secondary | ICD-10-CM | POA: Diagnosis not present

## 2021-07-13 DIAGNOSIS — R41841 Cognitive communication deficit: Secondary | ICD-10-CM | POA: Diagnosis not present

## 2021-07-13 DIAGNOSIS — R2681 Unsteadiness on feet: Secondary | ICD-10-CM | POA: Diagnosis not present

## 2021-07-13 DIAGNOSIS — M1990 Unspecified osteoarthritis, unspecified site: Secondary | ICD-10-CM | POA: Diagnosis not present

## 2021-07-13 DIAGNOSIS — M6281 Muscle weakness (generalized): Secondary | ICD-10-CM | POA: Diagnosis not present

## 2021-07-15 DIAGNOSIS — M1990 Unspecified osteoarthritis, unspecified site: Secondary | ICD-10-CM | POA: Diagnosis not present

## 2021-07-15 DIAGNOSIS — R41841 Cognitive communication deficit: Secondary | ICD-10-CM | POA: Diagnosis not present

## 2021-07-15 DIAGNOSIS — M6281 Muscle weakness (generalized): Secondary | ICD-10-CM | POA: Diagnosis not present

## 2021-07-15 DIAGNOSIS — R1312 Dysphagia, oropharyngeal phase: Secondary | ICD-10-CM | POA: Diagnosis not present

## 2021-07-15 DIAGNOSIS — R2681 Unsteadiness on feet: Secondary | ICD-10-CM | POA: Diagnosis not present

## 2021-07-19 DIAGNOSIS — R2681 Unsteadiness on feet: Secondary | ICD-10-CM | POA: Diagnosis not present

## 2021-07-19 DIAGNOSIS — R41841 Cognitive communication deficit: Secondary | ICD-10-CM | POA: Diagnosis not present

## 2021-07-19 DIAGNOSIS — M1990 Unspecified osteoarthritis, unspecified site: Secondary | ICD-10-CM | POA: Diagnosis not present

## 2021-07-19 DIAGNOSIS — M6281 Muscle weakness (generalized): Secondary | ICD-10-CM | POA: Diagnosis not present

## 2021-07-19 DIAGNOSIS — R1312 Dysphagia, oropharyngeal phase: Secondary | ICD-10-CM | POA: Diagnosis not present

## 2021-07-21 DIAGNOSIS — M1990 Unspecified osteoarthritis, unspecified site: Secondary | ICD-10-CM | POA: Diagnosis not present

## 2021-07-21 DIAGNOSIS — M6281 Muscle weakness (generalized): Secondary | ICD-10-CM | POA: Diagnosis not present

## 2021-07-21 DIAGNOSIS — R2681 Unsteadiness on feet: Secondary | ICD-10-CM | POA: Diagnosis not present

## 2021-07-21 DIAGNOSIS — R41841 Cognitive communication deficit: Secondary | ICD-10-CM | POA: Diagnosis not present

## 2021-07-21 DIAGNOSIS — R1312 Dysphagia, oropharyngeal phase: Secondary | ICD-10-CM | POA: Diagnosis not present

## 2021-07-22 DIAGNOSIS — M6281 Muscle weakness (generalized): Secondary | ICD-10-CM | POA: Diagnosis not present

## 2021-07-22 DIAGNOSIS — M1990 Unspecified osteoarthritis, unspecified site: Secondary | ICD-10-CM | POA: Diagnosis not present

## 2021-07-22 DIAGNOSIS — R41841 Cognitive communication deficit: Secondary | ICD-10-CM | POA: Diagnosis not present

## 2021-07-22 DIAGNOSIS — R2681 Unsteadiness on feet: Secondary | ICD-10-CM | POA: Diagnosis not present

## 2021-07-22 DIAGNOSIS — R1312 Dysphagia, oropharyngeal phase: Secondary | ICD-10-CM | POA: Diagnosis not present

## 2021-07-23 DIAGNOSIS — M79671 Pain in right foot: Secondary | ICD-10-CM | POA: Diagnosis not present

## 2021-07-23 DIAGNOSIS — M79672 Pain in left foot: Secondary | ICD-10-CM | POA: Diagnosis not present

## 2021-07-23 DIAGNOSIS — B351 Tinea unguium: Secondary | ICD-10-CM | POA: Diagnosis not present

## 2021-07-26 DIAGNOSIS — R1312 Dysphagia, oropharyngeal phase: Secondary | ICD-10-CM | POA: Diagnosis not present

## 2021-07-26 DIAGNOSIS — M6281 Muscle weakness (generalized): Secondary | ICD-10-CM | POA: Diagnosis not present

## 2021-07-26 DIAGNOSIS — R41841 Cognitive communication deficit: Secondary | ICD-10-CM | POA: Diagnosis not present

## 2021-07-26 DIAGNOSIS — R2681 Unsteadiness on feet: Secondary | ICD-10-CM | POA: Diagnosis not present

## 2021-07-26 DIAGNOSIS — M1990 Unspecified osteoarthritis, unspecified site: Secondary | ICD-10-CM | POA: Diagnosis not present

## 2021-07-28 DIAGNOSIS — M6281 Muscle weakness (generalized): Secondary | ICD-10-CM | POA: Diagnosis not present

## 2021-07-28 DIAGNOSIS — M1990 Unspecified osteoarthritis, unspecified site: Secondary | ICD-10-CM | POA: Diagnosis not present

## 2021-07-28 DIAGNOSIS — R1312 Dysphagia, oropharyngeal phase: Secondary | ICD-10-CM | POA: Diagnosis not present

## 2021-07-28 DIAGNOSIS — R41841 Cognitive communication deficit: Secondary | ICD-10-CM | POA: Diagnosis not present

## 2021-07-28 DIAGNOSIS — R2681 Unsteadiness on feet: Secondary | ICD-10-CM | POA: Diagnosis not present

## 2021-07-29 DIAGNOSIS — R2681 Unsteadiness on feet: Secondary | ICD-10-CM | POA: Diagnosis not present

## 2021-07-29 DIAGNOSIS — M6281 Muscle weakness (generalized): Secondary | ICD-10-CM | POA: Diagnosis not present

## 2021-07-29 DIAGNOSIS — M1990 Unspecified osteoarthritis, unspecified site: Secondary | ICD-10-CM | POA: Diagnosis not present

## 2021-07-29 DIAGNOSIS — R1312 Dysphagia, oropharyngeal phase: Secondary | ICD-10-CM | POA: Diagnosis not present

## 2021-07-29 DIAGNOSIS — R41841 Cognitive communication deficit: Secondary | ICD-10-CM | POA: Diagnosis not present

## 2021-08-02 DIAGNOSIS — M6281 Muscle weakness (generalized): Secondary | ICD-10-CM | POA: Diagnosis not present

## 2021-08-02 DIAGNOSIS — R2681 Unsteadiness on feet: Secondary | ICD-10-CM | POA: Diagnosis not present

## 2021-08-02 DIAGNOSIS — M1990 Unspecified osteoarthritis, unspecified site: Secondary | ICD-10-CM | POA: Diagnosis not present

## 2021-08-02 DIAGNOSIS — R41841 Cognitive communication deficit: Secondary | ICD-10-CM | POA: Diagnosis not present

## 2021-08-02 DIAGNOSIS — R1312 Dysphagia, oropharyngeal phase: Secondary | ICD-10-CM | POA: Diagnosis not present

## 2021-08-04 DIAGNOSIS — M6281 Muscle weakness (generalized): Secondary | ICD-10-CM | POA: Diagnosis not present

## 2021-08-04 DIAGNOSIS — R41841 Cognitive communication deficit: Secondary | ICD-10-CM | POA: Diagnosis not present

## 2021-08-04 DIAGNOSIS — R1312 Dysphagia, oropharyngeal phase: Secondary | ICD-10-CM | POA: Diagnosis not present

## 2021-08-04 DIAGNOSIS — R2681 Unsteadiness on feet: Secondary | ICD-10-CM | POA: Diagnosis not present

## 2021-08-04 DIAGNOSIS — M1990 Unspecified osteoarthritis, unspecified site: Secondary | ICD-10-CM | POA: Diagnosis not present

## 2021-08-05 DIAGNOSIS — R41841 Cognitive communication deficit: Secondary | ICD-10-CM | POA: Diagnosis not present

## 2021-08-05 DIAGNOSIS — R1312 Dysphagia, oropharyngeal phase: Secondary | ICD-10-CM | POA: Diagnosis not present

## 2021-08-05 DIAGNOSIS — M6281 Muscle weakness (generalized): Secondary | ICD-10-CM | POA: Diagnosis not present

## 2021-08-05 DIAGNOSIS — R2681 Unsteadiness on feet: Secondary | ICD-10-CM | POA: Diagnosis not present

## 2021-08-05 DIAGNOSIS — M1990 Unspecified osteoarthritis, unspecified site: Secondary | ICD-10-CM | POA: Diagnosis not present

## 2021-08-10 DIAGNOSIS — R2681 Unsteadiness on feet: Secondary | ICD-10-CM | POA: Diagnosis not present

## 2021-08-10 DIAGNOSIS — R1312 Dysphagia, oropharyngeal phase: Secondary | ICD-10-CM | POA: Diagnosis not present

## 2021-08-10 DIAGNOSIS — F411 Generalized anxiety disorder: Secondary | ICD-10-CM | POA: Diagnosis not present

## 2021-08-10 DIAGNOSIS — M6281 Muscle weakness (generalized): Secondary | ICD-10-CM | POA: Diagnosis not present

## 2021-08-10 DIAGNOSIS — R41841 Cognitive communication deficit: Secondary | ICD-10-CM | POA: Diagnosis not present

## 2021-08-10 DIAGNOSIS — M1990 Unspecified osteoarthritis, unspecified site: Secondary | ICD-10-CM | POA: Diagnosis not present

## 2021-08-11 DIAGNOSIS — M1990 Unspecified osteoarthritis, unspecified site: Secondary | ICD-10-CM | POA: Diagnosis not present

## 2021-08-11 DIAGNOSIS — R2681 Unsteadiness on feet: Secondary | ICD-10-CM | POA: Diagnosis not present

## 2021-08-11 DIAGNOSIS — R41841 Cognitive communication deficit: Secondary | ICD-10-CM | POA: Diagnosis not present

## 2021-08-11 DIAGNOSIS — R1312 Dysphagia, oropharyngeal phase: Secondary | ICD-10-CM | POA: Diagnosis not present

## 2021-08-11 DIAGNOSIS — M6281 Muscle weakness (generalized): Secondary | ICD-10-CM | POA: Diagnosis not present

## 2021-08-16 DIAGNOSIS — M1990 Unspecified osteoarthritis, unspecified site: Secondary | ICD-10-CM | POA: Diagnosis not present

## 2021-08-16 DIAGNOSIS — M6281 Muscle weakness (generalized): Secondary | ICD-10-CM | POA: Diagnosis not present

## 2021-08-16 DIAGNOSIS — R2681 Unsteadiness on feet: Secondary | ICD-10-CM | POA: Diagnosis not present

## 2021-08-18 DIAGNOSIS — M6281 Muscle weakness (generalized): Secondary | ICD-10-CM | POA: Diagnosis not present

## 2021-08-18 DIAGNOSIS — M1990 Unspecified osteoarthritis, unspecified site: Secondary | ICD-10-CM | POA: Diagnosis not present

## 2021-08-18 DIAGNOSIS — R2681 Unsteadiness on feet: Secondary | ICD-10-CM | POA: Diagnosis not present

## 2021-08-24 DIAGNOSIS — M1990 Unspecified osteoarthritis, unspecified site: Secondary | ICD-10-CM | POA: Diagnosis not present

## 2021-08-24 DIAGNOSIS — R2681 Unsteadiness on feet: Secondary | ICD-10-CM | POA: Diagnosis not present

## 2021-08-24 DIAGNOSIS — M6281 Muscle weakness (generalized): Secondary | ICD-10-CM | POA: Diagnosis not present

## 2021-08-25 DIAGNOSIS — M1990 Unspecified osteoarthritis, unspecified site: Secondary | ICD-10-CM | POA: Diagnosis not present

## 2021-08-25 DIAGNOSIS — R2681 Unsteadiness on feet: Secondary | ICD-10-CM | POA: Diagnosis not present

## 2021-08-25 DIAGNOSIS — M6281 Muscle weakness (generalized): Secondary | ICD-10-CM | POA: Diagnosis not present

## 2021-08-30 DIAGNOSIS — R2681 Unsteadiness on feet: Secondary | ICD-10-CM | POA: Diagnosis not present

## 2021-08-30 DIAGNOSIS — M1990 Unspecified osteoarthritis, unspecified site: Secondary | ICD-10-CM | POA: Diagnosis not present

## 2021-08-30 DIAGNOSIS — M6281 Muscle weakness (generalized): Secondary | ICD-10-CM | POA: Diagnosis not present

## 2021-09-01 DIAGNOSIS — R2681 Unsteadiness on feet: Secondary | ICD-10-CM | POA: Diagnosis not present

## 2021-09-01 DIAGNOSIS — M1990 Unspecified osteoarthritis, unspecified site: Secondary | ICD-10-CM | POA: Diagnosis not present

## 2021-09-01 DIAGNOSIS — M6281 Muscle weakness (generalized): Secondary | ICD-10-CM | POA: Diagnosis not present

## 2021-09-06 DIAGNOSIS — M6281 Muscle weakness (generalized): Secondary | ICD-10-CM | POA: Diagnosis not present

## 2021-09-06 DIAGNOSIS — M1990 Unspecified osteoarthritis, unspecified site: Secondary | ICD-10-CM | POA: Diagnosis not present

## 2021-09-06 DIAGNOSIS — R2681 Unsteadiness on feet: Secondary | ICD-10-CM | POA: Diagnosis not present

## 2021-09-07 DIAGNOSIS — F411 Generalized anxiety disorder: Secondary | ICD-10-CM | POA: Diagnosis not present

## 2021-09-08 DIAGNOSIS — R2681 Unsteadiness on feet: Secondary | ICD-10-CM | POA: Diagnosis not present

## 2021-09-08 DIAGNOSIS — M6281 Muscle weakness (generalized): Secondary | ICD-10-CM | POA: Diagnosis not present

## 2021-09-08 DIAGNOSIS — M1990 Unspecified osteoarthritis, unspecified site: Secondary | ICD-10-CM | POA: Diagnosis not present

## 2021-10-05 DIAGNOSIS — F411 Generalized anxiety disorder: Secondary | ICD-10-CM | POA: Diagnosis not present

## 2021-10-14 ENCOUNTER — Encounter: Payer: Self-pay | Admitting: Internal Medicine

## 2021-10-14 ENCOUNTER — Non-Acute Institutional Stay: Payer: Medicare Other | Admitting: Internal Medicine

## 2021-10-14 DIAGNOSIS — G8929 Other chronic pain: Secondary | ICD-10-CM | POA: Diagnosis not present

## 2021-10-14 DIAGNOSIS — E785 Hyperlipidemia, unspecified: Secondary | ICD-10-CM

## 2021-10-14 DIAGNOSIS — F411 Generalized anxiety disorder: Secondary | ICD-10-CM

## 2021-10-14 DIAGNOSIS — R42 Dizziness and giddiness: Secondary | ICD-10-CM | POA: Diagnosis not present

## 2021-10-14 DIAGNOSIS — K219 Gastro-esophageal reflux disease without esophagitis: Secondary | ICD-10-CM | POA: Diagnosis not present

## 2021-10-14 DIAGNOSIS — K5900 Constipation, unspecified: Secondary | ICD-10-CM

## 2021-10-14 DIAGNOSIS — I10 Essential (primary) hypertension: Secondary | ICD-10-CM

## 2021-10-14 DIAGNOSIS — F339 Major depressive disorder, recurrent, unspecified: Secondary | ICD-10-CM

## 2021-10-14 DIAGNOSIS — M545 Low back pain, unspecified: Secondary | ICD-10-CM

## 2021-10-14 DIAGNOSIS — M159 Polyosteoarthritis, unspecified: Secondary | ICD-10-CM

## 2021-10-14 DIAGNOSIS — M150 Primary generalized (osteo)arthritis: Secondary | ICD-10-CM

## 2021-10-14 NOTE — Progress Notes (Unsigned)
Location:   [place] Room Number: 8 Place of Service:  ALF [phone]) Provider:  Veleta Miners MD  Virgie Dad, MD  Patient Care Team: Virgie Dad, MD as PCP - General (Internal Medicine) Melina Modena, [facility] Clent Jacks, MD as Consulting Physician (Ophthalmology) Milus Banister, MD as Consulting Physician (Gastroenterology) Norma Fredrickson, MD as Consulting Physician (Psychiatry) Jessy Oto, MD as Consulting Physician (Orthopedic Surgery) Bjorn Loser, MD as Consulting Physician (Urology)  Extended Emergency Contact Information Primary Emergency Contact: [facility] Phone: [phone] Relation: Son Secondary Emergency Contact: [place], [place] [address] Johnnette Litter of [place] Phone: [phone] Mobile Phone: [phone] Relation: Son  Code Status:  Full Code Goals of care: Advanced Directive information    10/14/2021   11:45 AM  Advanced Directives  Does Patient Have a Medical Advance Directive? Yes  Type of Advance Directive Living will  Does patient want to make changes to medical advance directive? No - Patient declined     Chief Complaint  Patient presents with   Medical Management of Chronic Issues    HPI:  Pt is a 88 y.o. female seen today for medical management of chronic diseases.    Patient has h/o Major Depression with Anxiety, Hyperlipidemia, Chronic Bilateral Low Back Pain, Spinal Stenosis , Osteoarthritis, history of osteoporosis, hypertension  [place]   Her only complain continues to be feeling Dizzy 'Funny in her head' And Vision complains Has seen Neurology and Opthalmology Psych says this is  all they can for her anxiety Neurology thinks there is nothing more needed right now. She could not do MRI due to her anxiety  Other wise she is  stable. No new Nursing issues. No Behavior issues Her weight is stable Walks with her walker. Works with therapy twice a week No  Falls Wt Readings from Last 3 Encounters:  10/14/21 145 lb 9.6 oz (66 kg)  06/11/21 143 lb 12.8 oz (65.2 kg)  05/24/21 148 lb 12.8 oz (67.5 kg)   Past Medical History:  Diagnosis Date   Abnormal mammogram, unspecified 04/19/2011   Allergy    Anxiety    Backache, unspecified 01/21/2005   Cervicalgia 08/20/2013   Chronic kidney disease, stage II (mild) 04/03/2009   Chronic rhinitis 09/28/2010   DDD (degenerative disc disease)    Depression    Dermatophytosis of nail 01/21/2005   Diverticulosis of colon (without mention of hemorrhage) 06/17/2005   Edema 11/26/2004   GERD (gastroesophageal reflux disease)    Hiatal hernia    High cholesterol    Hyperglycemia 11/18/2014   Hypertension    Insomnia, unspecified 10/18/2011   Left inguinal hernia 04/24/2014   Nocturia 09/19/2008   Osteoporosis, senile 11/18/2014   Other abnormal blood chemistry 04/03/2007   Pain in joint, pelvic region and thigh 11/12/2011   Pain in joint, shoulder region 08/15/2006   Pain in limb 08/26/2005   Palpitations 09/29/2009   PAOD (peripheral arterial occlusive disease) ([place]) 11/18/2014   Right leg; diminished popliteal, dorsalis pedis, and posterior tibial artery pulsations    Postmenopausal atrophic vaginitis 12/14/2004   Scoliosis    Senile osteoporosis 12/14/2004   Tension headache 10/04/2005   Unspecified cataract 11/26/2004   Unspecified constipation 01/21/2005   Unspecified urinary incontinence 11/26/2004   Vaginal stenosis 11/18/2014   Tight band about 1/2 inches into the vagina which will not allow passage of my index finger.    Past Surgical History:  Procedure Laterality Date   ABDOMINAL HYSTERECTOMY  1969   Dr.Braun    bilateral eyelid surgery  2005   Dr.Scott   BILATERAL OOPHORECTOMY  2002   BREAST SURGERY     benign tumor removal, left breast 1969   CATARACT EXTRACTION     Right   CHOLECYSTECTOMY  1990   Dr.Moore    COLONOSCOPY  3/18/20008   inflammation, diverticular associated colitis -Dr. Ardis Hughs   COSMETIC  SURGERY     ESOPHAGOGASTRODUODENOSCOPY N/A 01/24/2014   Procedure: ESOPHAGOGASTRODUODENOSCOPY (EGD);  Surgeon: Irene Shipper, MD;  Location: [facility] ENDOSCOPY;  Service: Endoscopy;  Laterality: N/A;   ESOPHAGOGASTRODUODENOSCOPY ENDOSCOPY  04/04/14   Dr. Ardis Hughs   EYE SURGERY     FOREIGN BODY REMOVAL N/A 01/24/2014   Procedure: FOREIGN BODY REMOVAL;  Surgeon: Irene Shipper, MD;  Location: [place];  Service: Endoscopy;  Laterality: N/A;   HIP PINNING,CANNULATED  11/07/2011   Procedure: CANNULATED HIP PINNING;  Surgeon: Jessy Oto, MD;  Location: [facility] OR[facility];  Service: Orthopedics;  Laterality: Left;   ORIF FEMORAL NECK FRACTURE W/ DHS  11/07/2011   Dr.Nitka    TONSILLECTOMY     Dr.Joe Tamala Julian   TUBAL LIGATION     VAGINAL PROLAPSE REPAIR  2002   Dr.Horback     Allergies  Allergen Reactions   Brexpiprazole Swelling   Lamictal [Lamotrigine] Swelling    Lip swelling   Penicillins Other (See Comments)    CHILDHOOD REACTION   Abilify [Aripiprazole] Rash    Allergies as of 10/14/2021       Reactions   Brexpiprazole Swelling   Lamictal [lamotrigine] Swelling   Lip swelling   Penicillins Other (See Comments)   CHILDHOOD REACTION   Abilify [aripiprazole] Rash        Medication List        Accurate as of October 14, 2021 11:49 AM. If you have any questions, ask your nurse or doctor.          acetaminophen-codeine 300-30 MG tablet Commonly known as: TYLENOL #3 Take 1 tablet by mouth every 6 (six) hours as needed for moderate pain.   Acetylcysteine 600 MG Caps Take 1 capsule by mouth daily.   antiseptic oral rinse Liqd 15 mLs by Mouth Rinse route every 4 (four) hours as needed for dry mouth. Resident may self administer and keep in room   aspirin 81 MG chewable tablet Chew 81 mg by mouth daily.   buPROPion 300 MG 24 hr tablet Commonly known as: WELLBUTRIN XL Take 1 tablet (300 mg total) by mouth daily.   Calcium Carb-Cholecalciferol 600-800 MG-UNIT Tabs Take 1 tablet by mouth 2  (two) times daily.   chlorpheniramine-HYDROcodone 10-8 MG/5ML Suer Commonly known as: TUSSIONEX Take 5 mLs by mouth. Give 5 ml q12H PRN cough, Resident may self administer and keep in room   cholecalciferol 1000 units tablet Commonly known as: VITAMIN D Take 1,000 Units by mouth daily.   clonazePAM 0.5 MG tablet Commonly known as: KLONOPIN Take by mouth 4 (four) times daily as needed for anxiety. Take 1/2 tablet up to 4 times daily as needed for anxiety.   docusate sodium 100 MG capsule Commonly known as: Colace Take 1 capsule (100 mg total) by mouth daily.   econazole nitrate 1 % cream Apply topically. apply once daily until till resolved, may self administer, may keep at bedside   escitalopram 20 MG tablet Commonly known as: LEXAPRO Take 1 tablet (20 mg total) by mouth daily.  GLUCOSAMINE HCL PO Take 2,000 mg by mouth 2 (two) times daily.   hydrocortisone 2.5 % ointment Apply 1 application topically 2 (two) times daily.   LORazepam 0.5 MG tablet Commonly known as: ATIVAN Take one tablet three times daily as needed for anxiety and take one extra tablet as needed for severe anxiety.   lubiprostone 8 MCG capsule Commonly known as: AMITIZA Take 8 mcg by mouth 2 (two) times daily with a meal.   meloxicam 7.5 MG tablet Commonly known as: MOBIC TAKE 1 TABLET BY MOUTH EVERY DAY AS NEEDED FOR ARTHRITIS   Menthol (Topical Analgesic) 4 % Gel Apply 1 application topically as needed. Resident may self administer and keep in room   miconazole 2 % powder Commonly known as: MICOTIN Apply topically as needed for itching. apply to legs and feet once a day   mirtazapine 30 MG tablet Commonly known as: REMERON Take 30 mg by mouth at bedtime.   multivitamin with minerals Tabs tablet Take 1 tablet by mouth daily.   NIFEdipine 60 MG 24 hr tablet Commonly known as: PROCARDIA XL/NIFEDICAL XL TAKE 1 TABLET BY MOUTH EVERY DAY TO CONTROL BLOOD PRESSURE   OcuSoft Eyelid Cleansing  Pads Apply 1 application topically daily.   pantoprazole 40 MG tablet Commonly known as: PROTONIX One daily to reduce stomach acid   PEPTO-BISMOL PO Take 30 mg by mouth as needed. For diarrhea   polyethylene glycol 17 g packet Commonly known as: MIRALAX / GLYCOLAX Take 17 g by mouth daily. Mix 17g  with 8 oz. of fluid daily for constipation   pravastatin 10 MG tablet Commonly known as: PRAVACHOL Take 10 mg by mouth daily.   PRESERVISION AREDS 2+MULTI VIT PO Take 1 tablet by mouth 2 (two) times daily.   Systane 0.4-0.3 % Gel ophthalmic gel Generic drug: Polyethyl Glycol-Propyl Glycol Place 1 application into both eyes in the morning, at noon, in the evening, and at bedtime.   Systane 0.4-0.3 % Gel ophthalmic gel Generic drug: Polyethyl Glycol-Propyl Glycol Place 1 application into both eyes as needed.   Systane 0.4-0.3 % Soln Generic drug: Polyethyl Glycol-Propyl Glycol Apply 1 drop to eye 2 (two) times daily as needed.        Review of Systems  Constitutional:  Negative for activity change and appetite change.  HENT: Negative.    Respiratory:  Negative for cough and shortness of breath.   Cardiovascular:  Negative for leg swelling.  Gastrointestinal:  Negative for constipation.  Genitourinary: Negative.   Musculoskeletal:  Positive for gait problem. Negative for arthralgias and myalgias.  Skin: Negative.   Neurological:  Negative for dizziness and weakness.  Psychiatric/Behavioral:  Positive for dysphoric mood. Negative for confusion and sleep disturbance. The patient is nervous/anxious.    Immunization History  Administered Date(s) Administered   Influenza Whole 02/14/2012, 02/14/2013   Influenza, High Dose Seasonal PF 03/14/2017, 02/26/2019   Influenza-Unspecified 02/27/2014, 02/12/2015, 02/25/2016, 03/15/2017, 02/19/2018, 02/18/2020, 03/09/2021   Moderna SARS-COV2 Booster Vaccination 10/21/2020   Moderna Sars-Covid-2 Vaccination 05/20/2019, 06/17/2019,  03/30/2020   PFIZER(Purple Top)SARS-COV-2 Vaccination 02/03/2021   Pneumococcal Conjugate-13 07/13/2015   Pneumococcal Polysaccharide-23 05/16/1998   Td 05/16/2004   Zoster Recombinat (Shingrix) 02/20/2018, 05/11/2018, 07/16/2018   Zoster, Live 05/16/2005   Pertinent  Health Maintenance Due  Topic Date Due   INFLUENZA VACCINE  12/14/2021   DEXA SCAN  Completed   MAMMOGRAM  Discontinued      04/21/2017    3:08 PM 04/05/2018   12:08 PM 04/25/2018  2:43 PM 04/09/2019    1:22 PM 06/11/2021   11:40 AM  Fall Risk  Falls in the past year? No 0 0 0 1  Was there an injury with Fall?   0  0  Fall Risk Category Calculator   0  1  Fall Risk Category   Low  Low  Patient Fall Risk Level     Moderate fall risk  Patient at Risk for Falls Due to     History of fall(s);Impaired balance/gait;Impaired mobility;Impaired vision  Fall risk Follow up     Falls evaluation completed;Education provided   Functional Status Survey:    Vitals:   10/14/21 1119  BP: 134/72  Pulse: 78  Resp: 20  Temp: (!) 97 F (36.1 C)  SpO2: 95%  Weight: 145 lb 9.6 oz (66 kg)  Height: '5\' 2"'$  (1.575 m)   Body mass index is 26.63 kg/m. Physical Exam Vitals reviewed.  Constitutional:      Appearance: Normal appearance.  HENT:     Head: Normocephalic.     Nose: Nose normal.     Mouth/Throat:     Mouth: Mucous membranes are moist.     Pharynx: Oropharynx is clear.  Eyes:     Pupils: Pupils are equal, round, and reactive to light.  Cardiovascular:     Rate and Rhythm: Normal rate and regular rhythm.     Pulses: Normal pulses.     Heart sounds: Murmur heard.  Pulmonary:     Effort: Pulmonary effort is normal.     Breath sounds: Normal breath sounds.  Abdominal:     General: Abdomen is flat. Bowel sounds are normal.     Palpations: Abdomen is soft.  Musculoskeletal:        General: No swelling.     Cervical back: Neck supple.  Skin:    General: Skin is warm.  Neurological:     General: No focal  deficit present.     Mental Status: She is alert and oriented to person, place, and time.  Psychiatric:        Mood and Affect: Mood normal.        Thought Content: Thought content normal.    Labs reviewed: Recent Labs    12/01/20 0000 03/08/21 0000  NA 138 138  K 4.1 4.3  CL 101 99  CO2 27* 27*  BUN 26* 19  CREATININE 1.0 0.9  CALCIUM 9.7 10.0   Recent Labs    12/01/20 0000 03/08/21 0000  AST 17 17  ALT 12 12  ALKPHOS 64 65  ALBUMIN 4.0 4.2   Recent Labs    12/01/20 0000 03/08/21 0000  WBC 5.2 7.1  NEUTROABS  --  4,416.00  HGB 12.7 12.8  HCT 39 40  PLT 202 254   Lab Results  Component Value Date   TSH 2.10 03/08/2021   Lab Results  Component Value Date   HGBA1C 5.5 09/28/2018   Lab Results  Component Value Date   CHOL 164 03/08/2021   HDL 56 03/08/2021   LDLCALC 84 03/08/2021   TRIG 139 03/08/2021   CHOLHDL 3.5 08/04/2017    Significant Diagnostic Results in last 30 days:  No results found.  Assessment/Plan 1. GAD (generalized anxiety disorder) Continue Klonipin and Lorazepam Managed by Psych  She has meetings with them Q weekly  2. Major depression, recurrent, chronic ([place]) On LExapro  3. Primary hypertension Stable on Procardia  4. Hyperlipidemia LDL goal <100 On statin Repeat Lipid Panel  5. Gastroesophageal reflux disease without esophagitis Continue Protonix  6. Primary osteoarthritis involving multiple joints Mobic PRN  7. Light headedness Per Neurology No More work up Encourage PO fluids  8. Constipation, unspecified constipation type On Amitiza     Family/ staff Communication:   Labs/tests ordered:  CBC<CMP<TSH<LIPID

## 2021-10-18 DIAGNOSIS — E039 Hypothyroidism, unspecified: Secondary | ICD-10-CM | POA: Diagnosis not present

## 2021-10-18 DIAGNOSIS — E079 Disorder of thyroid, unspecified: Secondary | ICD-10-CM | POA: Diagnosis not present

## 2021-10-18 DIAGNOSIS — D649 Anemia, unspecified: Secondary | ICD-10-CM | POA: Diagnosis not present

## 2021-10-18 DIAGNOSIS — E785 Hyperlipidemia, unspecified: Secondary | ICD-10-CM | POA: Diagnosis not present

## 2021-10-27 DIAGNOSIS — Z23 Encounter for immunization: Secondary | ICD-10-CM | POA: Diagnosis not present

## 2021-11-02 DIAGNOSIS — F411 Generalized anxiety disorder: Secondary | ICD-10-CM | POA: Diagnosis not present

## 2021-11-05 DIAGNOSIS — M79671 Pain in right foot: Secondary | ICD-10-CM | POA: Diagnosis not present

## 2021-11-05 DIAGNOSIS — M79672 Pain in left foot: Secondary | ICD-10-CM | POA: Diagnosis not present

## 2021-11-05 DIAGNOSIS — B351 Tinea unguium: Secondary | ICD-10-CM | POA: Diagnosis not present

## 2021-11-30 DIAGNOSIS — F411 Generalized anxiety disorder: Secondary | ICD-10-CM | POA: Diagnosis not present

## 2021-12-06 ENCOUNTER — Non-Acute Institutional Stay: Payer: Medicare Other | Admitting: Orthopedic Surgery

## 2021-12-06 ENCOUNTER — Encounter: Payer: Self-pay | Admitting: Orthopedic Surgery

## 2021-12-06 DIAGNOSIS — I10 Essential (primary) hypertension: Secondary | ICD-10-CM

## 2021-12-06 DIAGNOSIS — E785 Hyperlipidemia, unspecified: Secondary | ICD-10-CM | POA: Diagnosis not present

## 2021-12-06 DIAGNOSIS — R42 Dizziness and giddiness: Secondary | ICD-10-CM | POA: Diagnosis not present

## 2021-12-06 DIAGNOSIS — H0016 Chalazion left eye, unspecified eyelid: Secondary | ICD-10-CM | POA: Diagnosis not present

## 2021-12-06 DIAGNOSIS — K219 Gastro-esophageal reflux disease without esophagitis: Secondary | ICD-10-CM | POA: Diagnosis not present

## 2021-12-06 DIAGNOSIS — F339 Major depressive disorder, recurrent, unspecified: Secondary | ICD-10-CM | POA: Diagnosis not present

## 2021-12-06 DIAGNOSIS — F411 Generalized anxiety disorder: Secondary | ICD-10-CM | POA: Diagnosis not present

## 2021-12-06 NOTE — Progress Notes (Signed)
Location:  [facility] Room Number: NO/08/AL Place of Service:  ALF [phone]) Provider:  Windell Moulding, Dewayne Hatch, MD  Patient Care Team: Virgie Dad, MD as PCP - General (Internal Medicine) Melina Modena, [facility] Clent Jacks, MD as Consulting Physician (Ophthalmology) Milus Banister, MD as Consulting Physician (Gastroenterology) Norma Fredrickson, MD as Consulting Physician (Psychiatry) Jessy Oto, MD as Consulting Physician (Orthopedic Surgery) Bjorn Loser, MD as Consulting Physician (Urology)  Extended Emergency Contact Information Primary Emergency Contact: [facility] Phone: [phone] Relation: Son Secondary Emergency Contact: [place], [place] [address] John[place] of [place] Phone: [phone] Mobile Phone: [phone] Relation: Son  Code Status:  FULL Goals of care: Advanced Directive information    12/06/2021    1:35 PM  Advanced Directives  Does Patient Have a Medical Advance Directive? Yes  Type of Advance Directive Living will  Does patient want to make changes to medical advance directive? No - Patient declined     Chief Complaint  Patient presents with   Acute Visit    Nodule on left eyelid    HPI:  Tina Hampton is a 88 y.o. female seen today for an acute visit due to nodule on left eyelid.   07/21 she noticed a small, painless nodule to left eyelid. She applied warm compress to left eye BID x 2 days. This morning she woke up and nodule appears bigger. Denies eye pain, changes in vision, eye exudate. She does not wear eye makeup. Washes face daily with mild soap.   Depression/anxiety- followed by psych- no further recommendations at this time, no recent mood changes or panic attacks, does weekly therapy sessions, remains on klonopin,  lorazepam, Remeron, Wellbutrin and Lexapro HTN- BUN/creat 20/0.88 10/18/2021, remains on Procardia HLD- LDL 90 10/18/2021, remain on statin GERD- hgb 12.8  03/08/2021, remains on Protonix Lightheadedness- evaluated by neurology, unable to do MRI due to anxiety, no further workup per goals of care    Past Medical History:  Diagnosis Date   Abnormal mammogram, unspecified 04/19/2011   Allergy    Anxiety    Backache, unspecified 01/21/2005   Cervicalgia 08/20/2013   Chronic kidney disease, stage II (mild) 04/03/2009   Chronic rhinitis 09/28/2010   DDD (degenerative disc disease)    Depression    Dermatophytosis of nail 01/21/2005   Diverticulosis of colon (without mention of hemorrhage) 06/17/2005   Edema 11/26/2004   GERD (gastroesophageal reflux disease)    Hiatal hernia    High cholesterol    Hyperglycemia 11/18/2014   Hypertension    Insomnia, unspecified 10/18/2011   Left inguinal hernia 04/24/2014   Nocturia 09/19/2008   Osteoporosis, senile 11/18/2014   Other abnormal blood chemistry 04/03/2007   Pain in joint, pelvic region and thigh 11/12/2011   Pain in joint, shoulder region 08/15/2006   Pain in limb 08/26/2005   Palpitations 09/29/2009   PAOD (peripheral arterial occlusive disease) ([place]) 11/18/2014   Right leg; diminished popliteal, dorsalis pedis, and posterior tibial artery pulsations    Postmenopausal atrophic vaginitis 12/14/2004   Scoliosis    Senile osteoporosis 12/14/2004   Tension headache 10/04/2005   Unspecified cataract 11/26/2004   Unspecified constipation 01/21/2005   Unspecified urinary incontinence 11/26/2004   Vaginal stenosis 11/18/2014   Tight band about 1/2 inches into the vagina which will not allow passage of my index finger.    Past Surgical History:  Procedure Laterality Date   ABDOMINAL HYSTERECTOMY  1969   Dr.Braun    bilateral eyelid surgery  2005   Dr.Scott   BILATERAL OOPHORECTOMY  2002   BREAST SURGERY     benign tumor removal, left breast 1969   CATARACT EXTRACTION     Right   CHOLECYSTECTOMY  1990   Dr.Moore    COLONOSCOPY  3/18/20008   inflammation, diverticular associated colitis -Dr. Ardis Hughs   COSMETIC  SURGERY     ESOPHAGOGASTRODUODENOSCOPY N/A 01/24/2014   Procedure: ESOPHAGOGASTRODUODENOSCOPY (EGD);  Surgeon: Irene Shipper, MD;  Location: [facility] ENDOSCOPY;  Service: Endoscopy;  Laterality: N/A;   ESOPHAGOGASTRODUODENOSCOPY ENDOSCOPY  04/04/14   Dr. Ardis Hughs   EYE SURGERY     FOREIGN BODY REMOVAL N/A 01/24/2014   Procedure: FOREIGN BODY REMOVAL;  Surgeon: Irene Shipper, MD;  Location: [facility];  Service: Endoscopy;  Laterality: N/A;   HIP PINNING,CANNULATED  11/07/2011   Procedure: CANNULATED HIP PINNING;  Surgeon: Jessy Oto, MD;  Location: [facility];  Service: Orthopedics;  Laterality: Left;   ORIF FEMORAL NECK FRACTURE W/ DHS  11/07/2011   Dr.Nitka    TONSILLECTOMY     Dr.Joe Tamala Julian   TUBAL LIGATION     VAGINAL PROLAPSE REPAIR  2002   Dr.Horback     Allergies  Allergen Reactions   Brexpiprazole Swelling   Lamictal [Lamotrigine] Swelling    Lip swelling   Penicillins Other (See Comments)    CHILDHOOD REACTION   Abilify [Aripiprazole] Rash    Outpatient Encounter Medications as of 12/06/2021  Medication Sig   acetaminophen-codeine (TYLENOL #3) 300-30 MG tablet Take 1 tablet by mouth every 6 (six) hours as needed for moderate pain.   Acetylcysteine 600 MG CAPS Take 1 capsule by mouth daily.   antiseptic oral rinse (BIOTENE) LIQD 15 mLs by Mouth Rinse route every 4 (four) hours as needed for dry mouth. Resident may self administer and keep in room   aspirin 81 MG chewable tablet Chew 81 mg by mouth daily.   Bismuth Subsalicylate (PEPTO-BISMOL PO) Take 30 mg by mouth as needed. For diarrhea   buPROPion (WELLBUTRIN XL) 300 MG 24 hr tablet Take 1 tablet (300 mg total) by mouth daily.   Calcium Carb-Cholecalciferol 600-800 MG-UNIT TABS Take 1 tablet by mouth 2 (two) times daily.   chlorpheniramine-HYDROcodone (TUSSIONEX) 10-8 MG/5ML SUER Take 5 mLs by mouth. Give 5 ml q12H PRN cough, Resident may self administer and keep in room   cholecalciferol (VITAMIN D) 1000 UNITS tablet Take 1,000  Units by mouth daily.   clonazePAM (KLONOPIN) 0.5 MG tablet Take by mouth 4 (four) times daily as needed for anxiety. Take 1/2 tablet up to 4 times daily as needed for anxiety.   docusate sodium (COLACE) 100 MG capsule Take 1 capsule (100 mg total) by mouth daily.   econazole nitrate 1 % cream Apply topically. apply once daily until till resolved, may self administer, may keep at bedside   escitalopram (LEXAPRO) 20 MG tablet Take 1 tablet (20 mg total) by mouth daily.   Eyelid Cleansers (OCUSOFT EYELID CLEANSING) PADS Apply 1 application topically daily.   GLUCOSAMINE HCL PO Take 2,000 mg by mouth 2 (two) times daily.   hydrocortisone 2.5 % ointment Apply 1 application topically 2 (two) times daily.   LORazepam (ATIVAN) 0.5 MG tablet Take one tablet three times daily as needed for anxiety and take one extra tablet as needed for severe anxiety.   lubiprostone (AMITIZA) 8 MCG capsule Take 8 mcg by mouth 2 (two) times daily with a  meal.   meloxicam (MOBIC) 7.5 MG tablet TAKE 1 TABLET BY MOUTH EVERY DAY AS NEEDED FOR ARTHRITIS   Menthol, Topical Analgesic, 4 % GEL Apply 1 application topically as needed. Resident may self administer and keep in room   miconazole (MICOTIN) 2 % powder Apply topically as needed for itching. apply to legs and feet once a day   mirtazapine (REMERON) 30 MG tablet Take 30 mg by mouth at bedtime.    Multiple Vitamin (MULTIVITAMIN WITH MINERALS) TABS Take 1 tablet by mouth daily.   Multiple Vitamins-Minerals (PRESERVISION AREDS 2+MULTI VIT PO) Take 1 tablet by mouth 2 (two) times daily.   NIFEdipine (PROCARDIA XL/ADALAT-CC) 60 MG 24 hr tablet TAKE 1 TABLET BY MOUTH EVERY DAY TO CONTROL BLOOD PRESSURE   pantoprazole (PROTONIX) 40 MG tablet One daily to reduce stomach acid   Polyethyl Glycol-Propyl Glycol (SYSTANE) 0.4-0.3 % GEL ophthalmic gel Place 1 application into both eyes in the morning, at noon, in the evening, and at bedtime.   Polyethyl Glycol-Propyl Glycol (SYSTANE)  0.4-0.3 % GEL ophthalmic gel Place 1 application into both eyes as needed.   Polyethyl Glycol-Propyl Glycol (SYSTANE) 0.4-0.3 % SOLN Apply 1 drop to eye 2 (two) times daily as needed.   polyethylene glycol (MIRALAX / GLYCOLAX) 17 g packet Take 17 g by mouth daily. Mix 17g  with 8 oz. of fluid daily for constipation   pravastatin (PRAVACHOL) 10 MG tablet Take 10 mg by mouth daily.   No facility-administered encounter medications on file as of 12/06/2021.    Review of Systems  Constitutional:  Negative for activity change, appetite change, chills, fatigue and fever.  HENT:  Negative for sore throat and trouble swallowing.   Eyes:  Negative for pain, discharge, redness and itching.  Respiratory:  Negative for cough, shortness of breath and wheezing.   Cardiovascular:  Negative for chest pain and leg swelling.  Gastrointestinal:  Positive for constipation. Negative for abdominal distention, abdominal pain, diarrhea, nausea and vomiting.  Genitourinary:  Negative for dysuria and frequency.  Musculoskeletal:  Positive for arthralgias and gait problem.  Skin:  Negative for wound.  Neurological:  Positive for weakness and light-headedness. Negative for dizziness.  Psychiatric/Behavioral:  Positive for dysphoric mood. Negative for confusion and sleep disturbance. The patient is nervous/anxious.     Immunization History  Administered Date(s) Administered   Influenza Whole 02/14/2012, 02/14/2013   Influenza, High Dose Seasonal PF 03/14/2017, 02/26/2019   Influenza-Unspecified 02/27/2014, 02/12/2015, 02/25/2016, 03/15/2017, 02/19/2018, 02/18/2020, 03/09/2021   Moderna SARS-COV2 Booster Vaccination 10/21/2020   Moderna Sars-Covid-2 Vaccination 05/20/2019, 06/17/2019, 03/30/2020   PFIZER(Purple Top)SARS-COV-2 Vaccination 02/03/2021   Pneumococcal Conjugate-13 07/13/2015   Pneumococcal Polysaccharide-23 05/16/1998   Td 05/16/2004   Zoster Recombinat (Shingrix) 02/20/2018, 05/11/2018, 07/16/2018    Zoster, Live 05/16/2005   Pertinent  Health Maintenance Due  Topic Date Due   INFLUENZA VACCINE  12/14/2021   DEXA SCAN  Completed   MAMMOGRAM  Discontinued      04/21/2017    3:08 PM 04/05/2018   12:08 PM 04/25/2018    2:43 PM 04/09/2019    1:22 PM 06/11/2021   11:40 AM  [place] in the past year? No 0 0 0 1  Was there an injury with Fall?   0  0  Fall Risk Category Calculator   0  1  Fall Risk Category   Low  Low  Patient Fall Risk Level     Moderate fall risk  Patient at Risk for Falls Due  to     History of fall(s);Impaired balance/gait;Impaired mobility;Impaired vision  Fall risk Follow up     Falls evaluation completed;Education provided   Functional Status Survey:    Vitals:   12/06/21 1336  BP: (!) 147/86  Pulse: 82  Resp: 16  Temp: (!) 97 F (36.1 C)  SpO2: 97%  Weight: 143 lb 12.8 oz (65.2 kg)  Height: '5\' 2"'$  (1.575 m)   Body mass index is 26.3 kg/m. Physical Exam Vitals reviewed.  Constitutional:      General: She is not in acute distress. HENT:     Head: Normocephalic.  Eyes:     General:        Right eye: No discharge.        Left eye: No discharge.     Comments: Approx <0.5 cm round, non tender nodule to left eyelid above lash line, otherwise normal eye exam  Cardiovascular:     Rate and Rhythm: Normal rate and regular rhythm.     Pulses: Normal pulses.     Heart sounds: Normal heart sounds.  Pulmonary:     Effort: Pulmonary effort is normal. No respiratory distress.     Breath sounds: Normal breath sounds. No wheezing.  Abdominal:     General: Bowel sounds are normal. There is no distension.     Palpations: Abdomen is soft.     Tenderness: There is no abdominal tenderness.  Musculoskeletal:     Cervical back: Neck supple.     Right lower leg: No edema.     Left lower leg: No edema.  Skin:    General: Skin is warm and dry.     Capillary Refill: Capillary refill takes less than 2 seconds.  Neurological:     General: No focal  deficit present.     Mental Status: She is alert and oriented to person, place, and time.     Motor: Weakness present.     Gait: Gait abnormal.     Comments: Walker  Psychiatric:        Mood and Affect: Mood is anxious.        Behavior: Behavior normal.     Labs reviewed: Recent Labs    03/08/21 0000  NA 138  K 4.3  CL 99  CO2 27*  BUN 19  CREATININE 0.9  CALCIUM 10.0   Recent Labs    03/08/21 0000  AST 17  ALT 12  ALKPHOS 65  ALBUMIN 4.2   Recent Labs    03/08/21 0000  WBC 7.1  NEUTROABS 4,416.00  HGB 12.8  HCT 40  PLT 254   Lab Results  Component Value Date   TSH 2.10 03/08/2021   Lab Results  Component Value Date   HGBA1C 5.5 09/28/2018   Lab Results  Component Value Date   CHOL 164 03/08/2021   HDL 56 03/08/2021   LDLCALC 84 03/08/2021   TRIG 139 03/08/2021   CHOLHDL 3.5 08/04/2017    Significant Diagnostic Results in last 30 days:  No results found.  Assessment/Plan 1. Acute chalazion of left eye - Approx <0.5 cm round, non tender nodule to left eyelid above lash line, otherwise normal eye exam - start warm compress 10-20 min TID x 3 weeks - wash eyes with Wynetta Emery Baby Shampoo daily x 3 weeks - avoid eye makeup  2. GAD (generalized anxiety disorder) - ongoing - followed by psych - cont clonazepam and lorazepam  3. Major depression, recurrent, chronic (HCC) - no mood changes -  cont Wellbutrin, Remeron and Lexapro  4. Primary hypertension - controlled with Procardia  5. Hyperlipidemia LDL goal <100 - LDL 90 10/18/2021 - cont statin  6. Gastroesophageal reflux disease without esophagitis - cont Protonix  7. Light headedness - evaluated by neurology - MRI recommended in past- unable to perform due to anxiety - no further workup per goals of care - encourage hydration with water    Family/ staff Communication: plan discussed with patient and nurse  Labs/tests ordered:  none

## 2021-12-30 DIAGNOSIS — F411 Generalized anxiety disorder: Secondary | ICD-10-CM | POA: Diagnosis not present

## 2022-01-04 ENCOUNTER — Encounter: Payer: Self-pay | Admitting: Adult Health

## 2022-01-04 ENCOUNTER — Non-Acute Institutional Stay: Payer: Medicare Other | Admitting: Adult Health

## 2022-01-04 DIAGNOSIS — B001 Herpesviral vesicular dermatitis: Secondary | ICD-10-CM | POA: Diagnosis not present

## 2022-01-04 NOTE — Progress Notes (Signed)
Location:  [facility] Room Number: 8 A Place of Service:  ALF [phone]) Provider:  Durenda Age, DNP, FNP-BC  Patient Care Team: Virgie Dad, MD as PCP - General (Internal Medicine) Melina Modena, [facility] Clent Jacks, MD as Consulting Physician (Ophthalmology) Milus Banister, MD as Consulting Physician (Gastroenterology) Norma Fredrickson, MD as Consulting Physician (Psychiatry) Jessy Oto, MD as Consulting Physician (Orthopedic Surgery) Bjorn Loser, MD as Consulting Physician (Urology)  Extended Emergency Contact Information Primary Emergency Contact: [facility] Phone: [phone] Relation: Son Secondary Emergency Contact: [place], [place] [address] Johnnette Litter of [place] Phone: [phone] Mobile Phone: [phone] Relation: Son  Code Status:  Full Code  Goals of care: Advanced Directive information    12/06/2021    1:35 PM  Advanced Directives  Does Patient Have a Medical Advance Directive? Yes  Type of Advance Directive Living will  Does patient want to make changes to medical advance directive? No - Patient declined     Chief Complaint  Patient presents with   Acute Visit    Cold sores    HPI:  Pt is a 88 y.o. female seen today for an acute visit regarding cold sores. She is a resident of [facility]. She was seen in the room today and was seen sitting up on a chair. She was noted to have erythematous rashes on her right lower lip. She complained that it burns when she touches them. She denies fever nor chills.    Past Medical History:  Diagnosis Date   Abnormal mammogram, unspecified 04/19/2011   Allergy    Anxiety    Backache, unspecified 01/21/2005   Cervicalgia 08/20/2013   Chronic kidney disease, stage II (mild) 04/03/2009   Chronic rhinitis 09/28/2010   DDD (degenerative disc disease)    Depression    Dermatophytosis of nail 01/21/2005   Diverticulosis of colon  (without mention of hemorrhage) 06/17/2005   Edema 11/26/2004   GERD (gastroesophageal reflux disease)    Hiatal hernia    High cholesterol    Hyperglycemia 11/18/2014   Hypertension    Insomnia, unspecified 10/18/2011   Left inguinal hernia 04/24/2014   Nocturia 09/19/2008   Osteoporosis, senile 11/18/2014   Other abnormal blood chemistry 04/03/2007   Pain in joint, pelvic region and thigh 11/12/2011   Pain in joint, shoulder region 08/15/2006   Pain in limb 08/26/2005   Palpitations 09/29/2009   PAOD (peripheral arterial occlusive disease) ([place]) 11/18/2014   Right leg; diminished popliteal, dorsalis pedis, and posterior tibial artery pulsations    Postmenopausal atrophic vaginitis 12/14/2004   Scoliosis    Senile osteoporosis 12/14/2004   Tension headache 10/04/2005   Unspecified cataract 11/26/2004   Unspecified constipation 01/21/2005   Unspecified urinary incontinence 11/26/2004   Vaginal stenosis 11/18/2014   Tight band about 1/2 inches into the vagina which will not allow passage of my index finger.    Past Surgical History:  Procedure Laterality Date   ABDOMINAL HYSTERECTOMY  1969   Dr.Braun    bilateral eyelid surgery  2005   Dr.Scott   BILATERAL OOPHORECTOMY  2002   BREAST SURGERY     benign tumor removal, left breast 1969   CATARACT EXTRACTION     Right   CHOLECYSTECTOMY  1990   Dr.Moore    COLONOSCOPY  3/18/20008   inflammation, diverticular associated colitis -Dr. Ardis Hughs   COSMETIC SURGERY     ESOPHAGOGASTRODUODENOSCOPY N/A 01/24/2014  Procedure: ESOPHAGOGASTRODUODENOSCOPY (EGD);  Surgeon: Irene Shipper, MD;  Location: [facility] ENDOSCOPY;  Service: Endoscopy;  Laterality: N/A;   ESOPHAGOGASTRODUODENOSCOPY ENDOSCOPY  04/04/14   Dr. Ardis Hughs   EYE SURGERY     FOREIGN BODY REMOVAL N/A 01/24/2014   Procedure: FOREIGN BODY REMOVAL;  Surgeon: Irene Shipper, MD;  Location: [facility];  Service: Endoscopy;  Laterality: N/A;   HIP PINNING,CANNULATED  11/07/2011   Procedure: CANNULATED HIP PINNING;   Surgeon: Jessy Oto, MD;  Location: [facility];  Service: Orthopedics;  Laterality: Left;   ORIF FEMORAL NECK FRACTURE W/ DHS  11/07/2011   Dr.Nitka    TONSILLECTOMY     Dr.Joe Tamala Julian   TUBAL LIGATION     VAGINAL PROLAPSE REPAIR  2002   Dr.Horback     Allergies  Allergen Reactions   Brexpiprazole Swelling   Lamictal [Lamotrigine] Swelling    Lip swelling   Penicillins Other (See Comments)    CHILDHOOD REACTION   Abilify [Aripiprazole] Rash    Outpatient Encounter Medications as of 01/04/2022  Medication Sig   acetaminophen-codeine (TYLENOL #3) 300-30 MG tablet Take 1 tablet by mouth every 6 (six) hours as needed for moderate pain.   Acetylcysteine 600 MG CAPS Take 1 capsule by mouth daily.   antiseptic oral rinse (BIOTENE) LIQD 15 mLs by Mouth Rinse route every 4 (four) hours as needed for dry mouth. Resident may self administer and keep in room   aspirin 81 MG chewable tablet Chew 81 mg by mouth daily.   Bismuth Subsalicylate (PEPTO-BISMOL PO) Take 30 mg by mouth as needed. For diarrhea   buPROPion (WELLBUTRIN XL) 300 MG 24 hr tablet Take 1 tablet (300 mg total) by mouth daily.   Calcium Carb-Cholecalciferol 600-800 MG-UNIT TABS Take 1 tablet by mouth 2 (two) times daily.   chlorpheniramine-HYDROcodone (TUSSIONEX) 10-8 MG/5ML SUER Take 5 mLs by mouth. Give 5 ml q12H PRN cough, Resident may self administer and keep in room   cholecalciferol (VITAMIN D) 1000 UNITS tablet Take 1,000 Units by mouth daily.   clonazePAM (KLONOPIN) 0.5 MG tablet Take by mouth 4 (four) times daily as needed for anxiety. Take 1/2 tablet up to 4 times daily as needed for anxiety.   docusate sodium (COLACE) 100 MG capsule Take 1 capsule (100 mg total) by mouth daily.   econazole nitrate 1 % cream Apply topically. apply once daily until till resolved, may self administer, may keep at bedside   escitalopram (LEXAPRO) 20 MG tablet Take 1 tablet (20 mg total) by mouth daily.   Eyelid Cleansers (OCUSOFT EYELID  CLEANSING) PADS Apply 1 application topically daily.   GLUCOSAMINE HCL PO Take 2,000 mg by mouth 2 (two) times daily.   hydrocortisone 2.5 % ointment Apply 1 application topically 2 (two) times daily.   LORazepam (ATIVAN) 0.5 MG tablet Take one tablet three times daily as needed for anxiety and take one extra tablet as needed for severe anxiety.   lubiprostone (AMITIZA) 8 MCG capsule Take 8 mcg by mouth 2 (two) times daily with a meal.   meloxicam (MOBIC) 7.5 MG tablet TAKE 1 TABLET BY MOUTH EVERY DAY AS NEEDED FOR ARTHRITIS   Menthol, Topical Analgesic, 4 % GEL Apply 1 application topically as needed. Resident may self administer and keep in room   miconazole (MICOTIN) 2 % powder Apply topically as needed for itching. apply to legs and feet once a day   mirtazapine (REMERON) 30 MG tablet Take 30 mg by mouth at bedtime.  Multiple Vitamin (MULTIVITAMIN WITH MINERALS) TABS Take 1 tablet by mouth daily.   Multiple Vitamins-Minerals (PRESERVISION AREDS 2+MULTI VIT PO) Take 1 tablet by mouth 2 (two) times daily.   NIFEdipine (PROCARDIA XL/ADALAT-CC) 60 MG 24 hr tablet TAKE 1 TABLET BY MOUTH EVERY DAY TO CONTROL BLOOD PRESSURE   pantoprazole (PROTONIX) 40 MG tablet One daily to reduce stomach acid   Polyethyl Glycol-Propyl Glycol (SYSTANE) 0.4-0.3 % GEL ophthalmic gel Place 1 application into both eyes in the morning, at noon, in the evening, and at bedtime.   Polyethyl Glycol-Propyl Glycol (SYSTANE) 0.4-0.3 % GEL ophthalmic gel Place 1 application into both eyes as needed.   Polyethyl Glycol-Propyl Glycol (SYSTANE) 0.4-0.3 % SOLN Apply 1 drop to eye 2 (two) times daily as needed.   polyethylene glycol (MIRALAX / GLYCOLAX) 17 g packet Take 17 g by mouth daily. Mix 17g  with 8 oz. of fluid daily for constipation   pravastatin (PRAVACHOL) 10 MG tablet Take 10 mg by mouth daily.   No facility-administered encounter medications on file as of 01/04/2022.    Review of Systems  Constitutional:  Negative  for appetite change, chills, fatigue and fever.  HENT:  Negative for congestion, hearing loss, rhinorrhea and sore throat.   Eyes: Negative.   Respiratory:  Negative for cough, shortness of breath and wheezing.   Cardiovascular:  Negative for chest pain, palpitations and leg swelling.  Gastrointestinal:  Negative for abdominal pain, constipation, diarrhea, nausea and vomiting.  Genitourinary:  Negative for dysuria.  Musculoskeletal:  Negative for arthralgias, back pain and myalgias.  Skin:  Positive for rash. Negative for color change and wound.       On right lower lip  Neurological:  Negative for dizziness, weakness and headaches.  Psychiatric/Behavioral:  Negative for behavioral problems. The patient is not nervous/anxious.       Immunization History  Administered Date(s) Administered   Influenza Whole 02/14/2012, 02/14/2013   Influenza, High Dose Seasonal PF 03/14/2017, 02/26/2019   Influenza-Unspecified 02/27/2014, 02/12/2015, 02/25/2016, 03/15/2017, 02/19/2018, 02/18/2020, 03/09/2021   Moderna SARS-COV2 Booster Vaccination 10/21/2020   Moderna Sars-Covid-2 Vaccination 05/20/2019, 06/17/2019, 03/30/2020   PFIZER(Purple Top)SARS-COV-2 Vaccination 02/03/2021   Pneumococcal Conjugate-13 07/13/2015   Pneumococcal Polysaccharide-23 05/16/1998   Td 05/16/2004   Zoster Recombinat (Shingrix) 02/20/2018, 05/11/2018, 07/16/2018   Zoster, Live 05/16/2005   Pertinent  Health Maintenance Due  Topic Date Due   INFLUENZA VACCINE  12/14/2021   DEXA SCAN  Completed   MAMMOGRAM  Discontinued      04/21/2017    3:08 PM 04/05/2018   12:08 PM 04/25/2018    2:43 PM 04/09/2019    1:22 PM 06/11/2021   11:40 AM  [place] in the past year? No 0 0 0 1  Was there an injury with Fall?   0  0  Fall Risk Category Calculator   0  1  Fall Risk Category   Low  Low  Patient Fall Risk Level     Moderate fall risk  Patient at Risk for Falls Due to     History of fall(s);Impaired  balance/gait;Impaired mobility;Impaired vision  Fall risk Follow up     Falls evaluation completed;Education provided     Vitals:   01/04/22 1700  BP: 130/66  Pulse: 70  Resp: 18  Temp: (!) 97.3 F (36.3 C)  Weight: 143 lb 12.8 oz (65.2 kg)  Height: '5\' 2"'$  (1.575 m)   Body mass index is 26.3 kg/m.  Physical Exam Constitutional:  General: She is not in acute distress.    Appearance: Normal appearance.  HENT:     Head: Normocephalic and atraumatic.     Nose: Nose normal.     Mouth/Throat:     Mouth: Mucous membranes are moist.     Comments: Right lower lip with erythematous rashes Eyes:     Conjunctiva/sclera: Conjunctivae normal.  Cardiovascular:     Rate and Rhythm: Normal rate and regular rhythm.     Heart sounds: Murmur heard.  Pulmonary:     Effort: Pulmonary effort is normal.     Breath sounds: Normal breath sounds.  Abdominal:     General: Bowel sounds are normal.     Palpations: Abdomen is soft.  Musculoskeletal:        General: Normal range of motion.     Cervical back: Normal range of motion.  Skin:    General: Skin is warm and dry.  Neurological:     General: No focal deficit present.     Mental Status: She is alert and oriented to person, place, and time.  Psychiatric:        Mood and Affect: Mood normal.        Behavior: Behavior normal.        Thought Content: Thought content normal.        Judgment: Judgment normal.        Labs reviewed: Recent Labs    03/08/21 0000  NA 138  K 4.3  CL 99  CO2 27*  BUN 19  CREATININE 0.9  CALCIUM 10.0   Recent Labs    03/08/21 0000  AST 17  ALT 12  ALKPHOS 65  ALBUMIN 4.2   Recent Labs    03/08/21 0000  WBC 7.1  NEUTROABS 4,416.00  HGB 12.8  HCT 40  PLT 254   Lab Results  Component Value Date   TSH 2.10 03/08/2021   Lab Results  Component Value Date   HGBA1C 5.5 09/28/2018   Lab Results  Component Value Date   CHOL 164 03/08/2021   HDL 56 03/08/2021   LDLCALC 84  03/08/2021   TRIG 139 03/08/2021   CHOLHDL 3.5 08/04/2017    Significant Diagnostic Results in last 30 days:  No results found.  Assessment/Plan  1. Fever blister -  start on Abreva 10% cream apply to rashes on right lower lip 5X/day X 10 days -  use straw if it hurts to drink from a glass -  eat soft, bland diet. Avoid hot, cold and salty foods, to prevent lip/mouth pain    Family/ staff Communication: Discussed plan of care with resident and charge nurse  Labs/tests ordered:   None    Durenda Age, DNP, MSN, FNP-BC [facility] [phone] (Monday-Friday 8:00 a.m. - 5:00 p.m.) [phone] (after hours)

## 2022-01-05 ENCOUNTER — Other Ambulatory Visit: Payer: Self-pay | Admitting: Orthopedic Surgery

## 2022-01-05 DIAGNOSIS — B001 Herpesviral vesicular dermatitis: Secondary | ICD-10-CM

## 2022-01-05 MED ORDER — DOCOSANOL 10 % EX CREA: 1.0000 | TOPICAL_CREAM | Freq: Every day | CUTANEOUS | 0 refills | Status: AC

## 2022-01-25 DIAGNOSIS — F411 Generalized anxiety disorder: Secondary | ICD-10-CM | POA: Diagnosis not present

## 2022-02-08 ENCOUNTER — Encounter: Payer: Self-pay | Admitting: Orthopedic Surgery

## 2022-02-08 ENCOUNTER — Non-Acute Institutional Stay: Payer: Medicare Other | Admitting: Orthopedic Surgery

## 2022-02-08 DIAGNOSIS — K219 Gastro-esophageal reflux disease without esophagitis: Secondary | ICD-10-CM

## 2022-02-08 DIAGNOSIS — R42 Dizziness and giddiness: Secondary | ICD-10-CM | POA: Diagnosis not present

## 2022-02-08 DIAGNOSIS — E785 Hyperlipidemia, unspecified: Secondary | ICD-10-CM

## 2022-02-08 DIAGNOSIS — M159 Polyosteoarthritis, unspecified: Secondary | ICD-10-CM | POA: Diagnosis not present

## 2022-02-08 DIAGNOSIS — F411 Generalized anxiety disorder: Secondary | ICD-10-CM

## 2022-02-08 DIAGNOSIS — I10 Essential (primary) hypertension: Secondary | ICD-10-CM | POA: Diagnosis not present

## 2022-02-08 DIAGNOSIS — F339 Major depressive disorder, recurrent, unspecified: Secondary | ICD-10-CM | POA: Diagnosis not present

## 2022-02-08 DIAGNOSIS — M810 Age-related osteoporosis without current pathological fracture: Secondary | ICD-10-CM | POA: Diagnosis not present

## 2022-02-08 NOTE — Progress Notes (Signed)
Location:  [place] Room Number: 8/A Place of Service:  ALF [phone]) Provider:  Yvonna Alanis, NP   Virgie Dad, MD  Patient Care Team: Virgie Dad, MD as PCP - General (Internal Medicine) Melina Modena, [facility] Clent Jacks, MD as Consulting Physician (Ophthalmology) Milus Banister, MD as Consulting Physician (Gastroenterology) Norma Fredrickson, MD as Consulting Physician (Psychiatry) Jessy Oto, MD as Consulting Physician (Orthopedic Surgery) Bjorn Loser, MD as Consulting Physician (Urology)  Extended Emergency Contact Information Primary Emergency Contact: [facility] Phone: [phone] Relation: Son Secondary Emergency Contact: [place], [place] [address] [address] of [place] Phone: [phone] Mobile Phone: [phone] Relation: Son  Code Status:  Full code Goals of care: Advanced Directive information    12/06/2021    1:35 PM  Advanced Directives  Does Patient Have a Medical Advance Directive? Yes  Type of Advance Directive Living will  Does patient want to make changes to medical advance directive? No - Patient declined     Chief Complaint  Patient presents with   Medical Management of Chronic Issues    HPI:  Pt is a 88 y.o. female seen today for medical management of chronic diseases.    She currently resides on the assisted living unit at [facility]. PMH: HTN, HLD, major depression, GAD, chronic back pain , spinal stenosis, OA, osteoporosis.   Depression/GAD- followed by psych- no further recommendations at this time, no recent mood changes or panic attacks, monthly therapy sessions, remains on klonopin,  lorazepam, Remeron, Wellbutrin and Lexapro HTN- BUN/creat 20/0.88 10/18/2021, remains on Procardia HLD- LDL 90 10/18/2021, remain on pravastatin GERD- hgb 12.2 10/18/2021, remains on Protonix Lightheadedness- evaluated by neurology, unable to do MRI due to anxiety, no further  workup per goals of care OA- currently working with PT, ambulates with walker, no recent falls, remains on biofreeze/tylenol/meloxicam Osteoporosis- DEXA 2018, t score -2.9, remains on calcium and vitamin D  Recent blood pressures:  09/20- 118/69  09/13- 150/81  09/10- 128/69  Recent weights:  09/04- 141.2 lbs  07/05- 143.8 lbs  06/04- 145.2 lbs   Past Medical History:  Diagnosis Date   Abnormal mammogram, unspecified 04/19/2011   Allergy    Anxiety    Backache, unspecified 01/21/2005   Cervicalgia 08/20/2013   Chronic kidney disease, stage II (mild) 04/03/2009   Chronic rhinitis 09/28/2010   DDD (degenerative disc disease)    Depression    Dermatophytosis of nail 01/21/2005   Diverticulosis of colon (without mention of hemorrhage) 06/17/2005   Edema 11/26/2004   GERD (gastroesophageal reflux disease)    Hiatal hernia    High cholesterol    Hyperglycemia 11/18/2014   Hypertension    Insomnia, unspecified 10/18/2011   Left inguinal hernia 04/24/2014   Nocturia 09/19/2008   Osteoporosis, senile 11/18/2014   Other abnormal blood chemistry 04/03/2007   Pain in joint, pelvic region and thigh 11/12/2011   Pain in joint, shoulder region 08/15/2006   Pain in limb 08/26/2005   Palpitations 09/29/2009   PAOD (peripheral arterial occlusive disease) ([place]) 11/18/2014   Right leg; diminished popliteal, dorsalis pedis, and posterior tibial artery pulsations    Postmenopausal atrophic vaginitis 12/14/2004   Scoliosis    Senile osteoporosis 12/14/2004   Tension headache 10/04/2005   Unspecified cataract 11/26/2004   Unspecified constipation 01/21/2005   Unspecified urinary incontinence 11/26/2004   Vaginal stenosis 11/18/2014   Tight band about 1/2 inches into the  vagina which will not allow passage of my index finger.    Past Surgical History:  Procedure Laterality Date   ABDOMINAL HYSTERECTOMY  1969   Dr.Braun    bilateral eyelid surgery  2005   Dr.Scott   BILATERAL OOPHORECTOMY  2002   BREAST SURGERY      benign tumor removal, left breast 1969   CATARACT EXTRACTION     Right   CHOLECYSTECTOMY  1990   Dr.Moore    COLONOSCOPY  3/18/20008   inflammation, diverticular associated colitis -Dr. Ardis Hughs   COSMETIC SURGERY     ESOPHAGOGASTRODUODENOSCOPY N/A 01/24/2014   Procedure: ESOPHAGOGASTRODUODENOSCOPY (EGD);  Surgeon: Irene Shipper, MD;  Location: [facility] ENDOSCOPY;  Service: Endoscopy;  Laterality: N/A;   ESOPHAGOGASTRODUODENOSCOPY ENDOSCOPY  04/04/14   Dr. Ardis Hughs   EYE SURGERY     FOREIGN BODY REMOVAL N/A 01/24/2014   Procedure: FOREIGN BODY REMOVAL;  Surgeon: Irene Shipper, MD;  Location: [place];  Service: Endoscopy;  Laterality: N/A;   HIP PINNING,CANNULATED  11/07/2011   Procedure: CANNULATED HIP PINNING;  Surgeon: Jessy Oto, MD;  Location: [facility];  Service: Orthopedics;  Laterality: Left;   ORIF FEMORAL NECK FRACTURE W/ DHS  11/07/2011   Dr.Nitka    TONSILLECTOMY     Dr.Joe Tamala Julian   TUBAL LIGATION     VAGINAL PROLAPSE REPAIR  2002   Dr.Horback     Allergies  Allergen Reactions   Brexpiprazole Swelling   Lamictal [Lamotrigine] Swelling    Lip swelling   Penicillins Other (See Comments)    CHILDHOOD REACTION   Abilify [Aripiprazole] Rash    Outpatient Encounter Medications as of 02/08/2022  Medication Sig   acetaminophen-codeine (TYLENOL #3) 300-30 MG tablet Take 1 tablet by mouth every 6 (six) hours as needed for moderate pain.   Acetylcysteine 600 MG CAPS Take 1 capsule by mouth daily.   antiseptic oral rinse (BIOTENE) LIQD 15 mLs by Mouth Rinse route every 4 (four) hours as needed for dry mouth. Resident may self administer and keep in room   aspirin 81 MG chewable tablet Chew 81 mg by mouth daily.   Bismuth Subsalicylate (PEPTO-BISMOL PO) Take 30 mg by mouth as needed. For diarrhea   buPROPion (WELLBUTRIN XL) 300 MG 24 hr tablet Take 1 tablet (300 mg total) by mouth daily.   Calcium Carb-Cholecalciferol 600-800 MG-UNIT TABS Take 1 tablet by mouth 2 (two) times daily.    chlorpheniramine-HYDROcodone (TUSSIONEX) 10-8 MG/5ML SUER Take 5 mLs by mouth. Give 5 ml q12H PRN cough, Resident may self administer and keep in room   cholecalciferol (VITAMIN D) 1000 UNITS tablet Take 1,000 Units by mouth daily.   clonazePAM (KLONOPIN) 0.5 MG tablet Take by mouth 4 (four) times daily as needed for anxiety. Take 1/2 tablet up to 4 times daily as needed for anxiety.   docusate sodium (COLACE) 100 MG capsule Take 1 capsule (100 mg total) by mouth daily.   econazole nitrate 1 % cream Apply topically. apply once daily until till resolved, may self administer, may keep at bedside   escitalopram (LEXAPRO) 20 MG tablet Take 1 tablet (20 mg total) by mouth daily.   Eyelid Cleansers (OCUSOFT EYELID CLEANSING) PADS Apply 1 application topically daily.   GLUCOSAMINE HCL PO Take 2,000 mg by mouth 2 (two) times daily.   hydrocortisone 2.5 % ointment Apply 1 application topically 2 (two) times daily.   LORazepam (ATIVAN) 0.5 MG tablet Take one tablet three times daily as needed for anxiety and take one  extra tablet as needed for severe anxiety.   lubiprostone (AMITIZA) 8 MCG capsule Take 8 mcg by mouth 2 (two) times daily with a meal.   meloxicam (MOBIC) 7.5 MG tablet TAKE 1 TABLET BY MOUTH EVERY DAY AS NEEDED FOR ARTHRITIS   Menthol, Topical Analgesic, 4 % GEL Apply 1 application topically as needed. Resident may self administer and keep in room   miconazole (MICOTIN) 2 % powder Apply topically as needed for itching. apply to legs and feet once a day   mirtazapine (REMERON) 30 MG tablet Take 30 mg by mouth at bedtime.    Multiple Vitamin (MULTIVITAMIN WITH MINERALS) TABS Take 1 tablet by mouth daily.   Multiple Vitamins-Minerals (PRESERVISION AREDS 2+MULTI VIT PO) Take 1 tablet by mouth 2 (two) times daily.   NIFEdipine (PROCARDIA XL/ADALAT-CC) 60 MG 24 hr tablet TAKE 1 TABLET BY MOUTH EVERY DAY TO CONTROL BLOOD PRESSURE   pantoprazole (PROTONIX) 40 MG tablet One daily to reduce stomach acid    Polyethyl Glycol-Propyl Glycol (SYSTANE) 0.4-0.3 % GEL ophthalmic gel Place 1 application into both eyes in the morning, at noon, in the evening, and at bedtime.   Polyethyl Glycol-Propyl Glycol (SYSTANE) 0.4-0.3 % GEL ophthalmic gel Place 1 application into both eyes as needed.   Polyethyl Glycol-Propyl Glycol (SYSTANE) 0.4-0.3 % SOLN Apply 1 drop to eye 2 (two) times daily as needed.   polyethylene glycol (MIRALAX / GLYCOLAX) 17 g packet Take 17 g by mouth daily. Mix 17g  with 8 oz. of fluid daily for constipation   pravastatin (PRAVACHOL) 10 MG tablet Take 10 mg by mouth daily.   No facility-administered encounter medications on file as of 02/08/2022.    Review of Systems  Constitutional:  Negative for activity change, appetite change, chills, fatigue and fever.  HENT:  Negative for congestion and trouble swallowing.   Eyes:  Negative for visual disturbance.  Respiratory:  Negative for cough, shortness of breath and wheezing.   Cardiovascular:  Negative for chest pain and leg swelling.  Gastrointestinal:  Negative for abdominal distention, abdominal pain, constipation, diarrhea, nausea and vomiting.  Genitourinary:  Positive for frequency. Negative for dysuria.       Incontinence  Musculoskeletal:  Positive for arthralgias, back pain and gait problem.  Skin:  Negative for wound.  Neurological:  Positive for weakness. Negative for dizziness and headaches.  Psychiatric/Behavioral:  Positive for dysphoric mood. Negative for confusion and sleep disturbance. The patient is nervous/anxious.     Immunization History  Administered Date(s) Administered   Influenza Whole 02/14/2012, 02/14/2013   Influenza, High Dose Seasonal PF 03/14/2017, 02/26/2019   Influenza-Unspecified 02/27/2014, 02/12/2015, 02/25/2016, 03/15/2017, 02/19/2018, 02/18/2020, 03/09/2021   Moderna SARS-COV2 Booster Vaccination 10/21/2020   Moderna Sars-Covid-2 Vaccination 05/20/2019, 06/17/2019, 03/30/2020   PFIZER(Purple  Top)SARS-COV-2 Vaccination 02/03/2021   Pneumococcal Conjugate-13 07/13/2015   Pneumococcal Polysaccharide-23 05/16/1998   Td 05/16/2004   Zoster Recombinat (Shingrix) 02/20/2018, 05/11/2018, 07/16/2018   Zoster, Live 05/16/2005   Pertinent  Health Maintenance Due  Topic Date Due   INFLUENZA VACCINE  12/14/2021   DEXA SCAN  Completed   MAMMOGRAM  Discontinued      04/21/2017    3:08 PM 04/05/2018   12:08 PM 04/25/2018    2:43 PM 04/09/2019    1:22 PM 06/11/2021   11:40 AM  [place] in the past year? No 0 0 0 1  Was there an injury with Fall?   0  0  Fall Risk Category Calculator   0  1  Fall Risk Category   Low  Low  Patient Fall Risk Level     Moderate fall risk  Patient at Risk for Falls Due to     History of fall(s);Impaired balance/gait;Impaired mobility;Impaired vision  Fall risk Follow up     Falls evaluation completed;Education provided   Functional Status Survey:    Vitals:   02/08/22 1503  BP: 118/69  Pulse: 70  Resp: 20  Temp: 98.2 F (36.8 C)  SpO2: 94%  Weight: 141 lb 3.2 oz (64 kg)  Height: '5\' 2"'$  (1.575 m)   Body mass index is 25.83 kg/m. Physical Exam Vitals reviewed.  Constitutional:      General: She is not in acute distress. HENT:     Head: Normocephalic.     Right Ear: There is no impacted cerumen.     Left Ear: There is no impacted cerumen.     Nose: Nose normal.     Mouth/Throat:     Mouth: Mucous membranes are moist.  Eyes:     General:        Right eye: No discharge.        Left eye: No discharge.  Cardiovascular:     Rate and Rhythm: Normal rate and regular rhythm.     Pulses: Normal pulses.     Heart sounds: Murmur heard.  Pulmonary:     Effort: Pulmonary effort is normal. No respiratory distress.     Breath sounds: Normal breath sounds. No wheezing.  Abdominal:     General: Bowel sounds are normal. There is no distension.     Palpations: Abdomen is soft.     Tenderness: There is no abdominal tenderness.   Musculoskeletal:     Cervical back: Neck supple.     Right lower leg: No edema.     Left lower leg: No edema.  Skin:    General: Skin is warm and dry.     Capillary Refill: Capillary refill takes less than 2 seconds.  Neurological:     General: No focal deficit present.     Mental Status: She is alert and oriented to person, place, and time.     Motor: Weakness present.     Gait: Gait abnormal.     Comments: walker  Psychiatric:        Mood and Affect: Mood normal.        Behavior: Behavior normal.     Labs reviewed: Recent Labs    03/08/21 0000  NA 138  K 4.3  CL 99  CO2 27*  BUN 19  CREATININE 0.9  CALCIUM 10.0   Recent Labs    03/08/21 0000  AST 17  ALT 12  ALKPHOS 65  ALBUMIN 4.2   Recent Labs    03/08/21 0000  WBC 7.1  NEUTROABS 4,416.00  HGB 12.8  HCT 40  PLT 254   Lab Results  Component Value Date   TSH 2.10 03/08/2021   Lab Results  Component Value Date   HGBA1C 5.5 09/28/2018   Lab Results  Component Value Date   CHOL 164 03/08/2021   HDL 56 03/08/2021   LDLCALC 84 03/08/2021   TRIG 139 03/08/2021   CHOLHDL 3.5 08/04/2017    Significant Diagnostic Results in last 30 days:  No results found.  Assessment/Plan 1. Major depression, recurrent, chronic (HCC) - no mood changes - monthly counseling with [facility] - cont Wellbutrin, lexapro, and Remeron  2. GAD (generalized anxiety disorder) - no panic attacks -  see above - cont clonazepam and ativan  3. Primary hypertension - controlled with Procardia  4. Hyperlipidemia LDL goal <100 - LDL at goal - cont pravastatin  5. Gastroesophageal reflux disease without esophagitis - hgb stable - cont Protonix  6. Light headedness - evaluated by neurology- no further workup - cont statin  7. Primary osteoarthritis involving multiple joints - cont Biofreeze, tylenol and meloxicam  8. Age-related osteoporosis without current pathological fracture - DEXA 2018, t score -2.9 -  cont calcium/vit D supplement     Family/ staff Communication: plan discussed with patient and nurse  Labs/tests ordered:  none

## 2022-03-15 ENCOUNTER — Other Ambulatory Visit: Payer: Self-pay | Admitting: Adult Health

## 2022-03-15 DIAGNOSIS — R2681 Unsteadiness on feet: Secondary | ICD-10-CM | POA: Diagnosis not present

## 2022-03-15 DIAGNOSIS — F411 Generalized anxiety disorder: Secondary | ICD-10-CM

## 2022-03-15 DIAGNOSIS — M25512 Pain in left shoulder: Secondary | ICD-10-CM | POA: Diagnosis not present

## 2022-03-15 DIAGNOSIS — M545 Low back pain, unspecified: Secondary | ICD-10-CM | POA: Diagnosis not present

## 2022-03-15 DIAGNOSIS — M6281 Muscle weakness (generalized): Secondary | ICD-10-CM | POA: Diagnosis not present

## 2022-03-15 DIAGNOSIS — F429 Obsessive-compulsive disorder, unspecified: Secondary | ICD-10-CM

## 2022-03-15 MED ORDER — LORAZEPAM 0.5 MG PO TABS: ORAL_TABLET | ORAL | 0 refills | Status: DC

## 2022-03-17 ENCOUNTER — Non-Acute Institutional Stay: Payer: Medicare Other | Admitting: Internal Medicine

## 2022-03-17 ENCOUNTER — Encounter: Payer: Self-pay | Admitting: Internal Medicine

## 2022-03-17 DIAGNOSIS — M25512 Pain in left shoulder: Secondary | ICD-10-CM | POA: Diagnosis not present

## 2022-03-17 DIAGNOSIS — M6281 Muscle weakness (generalized): Secondary | ICD-10-CM | POA: Diagnosis not present

## 2022-03-17 DIAGNOSIS — F411 Generalized anxiety disorder: Secondary | ICD-10-CM | POA: Diagnosis not present

## 2022-03-17 DIAGNOSIS — F429 Obsessive-compulsive disorder, unspecified: Secondary | ICD-10-CM

## 2022-03-17 DIAGNOSIS — M7989 Other specified soft tissue disorders: Secondary | ICD-10-CM | POA: Diagnosis not present

## 2022-03-17 DIAGNOSIS — M545 Low back pain, unspecified: Secondary | ICD-10-CM | POA: Diagnosis not present

## 2022-03-17 DIAGNOSIS — R2681 Unsteadiness on feet: Secondary | ICD-10-CM | POA: Diagnosis not present

## 2022-03-17 MED ORDER — LORAZEPAM 0.5 MG PO TABS: ORAL_TABLET | ORAL | 0 refills | Status: DC

## 2022-03-17 NOTE — Progress Notes (Signed)
Location:  [facility] Room Number: 8/A Place of Service:  AL[facility] [phone]) Provider:  Virgie Dad, MD   Virgie Dad, MD  Patient Care Team: Virgie Dad, MD as PCP - General (Internal Medicine) Melina Modena, [facility] Clent Jacks, MD as Consulting Physician (Ophthalmology) Milus Banister, MD as Consulting Physician (Gastroenterology) Norma Fredrickson, MD as Consulting Physician (Psychiatry) Jessy Oto, MD as Consulting Physician (Orthopedic Surgery) Bjorn Loser, MD as Consulting Physician (Urology)  Extended Emergency Contact Information Primary Emergency Contact: [facility] Phone: [phone] Relation: Son Secondary Emergency Contact: [place], [place] [address] [address] of [place] Phone: [phone] Mobile Phone: [phone] Relation: Son  Code Status:  full code Goals of care: Advanced Directive information    03/17/2022   11:52 AM  Advanced Directives  Does Patient Have a Medical Advance Directive? Yes  Type of Advance Directive Living will     Chief Complaint  Patient presents with   Acute Visit    Patient is having some Arm swelling.    HPI:  Pt is a 88 y.o. female seen today for an acute visit for Left Arm swelling and Anxiety  [place]ives in [place]  Patient has h/o Major Depression with Anxiety, Hyperlipidemia, Chronic Bilateral Low Back Pain, Spinal Stenosis , Osteoarthritis, history of osteoporosis, hypertension   Seen today as she was c/o Left arm swelling and tender since she got her Flu shot Also very anxious as she did not get her Ativan supply for a month She takes 4 tablets in a day of ativan per Psych services though she gets prescription from [place]  [facility] with her walker. Works with therapy twice a week   Her other issues  Has h/o Dizziness Has seen Neurology and Opthalmology Psych says this is  all they can for her anxiety Neurology thinks there is nothing more needed right  now. She could not do MRI due to her anxiety  Past Medical History:  Diagnosis Date   Abnormal mammogram, unspecified 04/19/2011   Allergy    Anxiety    Backache, unspecified 01/21/2005   Cervicalgia 08/20/2013   Chronic kidney disease, stage II (mild) 04/03/2009   Chronic rhinitis 09/28/2010   DDD (degenerative disc disease)    Depression    Dermatophytosis of nail 01/21/2005   Diverticulosis of colon (without mention of hemorrhage) 06/17/2005   Edema 11/26/2004   GERD (gastroesophageal reflux disease)    Hiatal hernia    High cholesterol    Hyperglycemia 11/18/2014   Hypertension    Insomnia, unspecified 10/18/2011   Left inguinal hernia 04/24/2014   Nocturia 09/19/2008   Osteoporosis, senile 11/18/2014   Other abnormal blood chemistry 04/03/2007   Pain in joint, pelvic region and thigh 11/12/2011   Pain in joint, shoulder region 08/15/2006   Pain in limb 08/26/2005   Palpitations 09/29/2009   PAOD (peripheral arterial occlusive disease) ([place]) 11/18/2014   Right leg; diminished popliteal, dorsalis pedis, and posterior tibial artery pulsations    Postmenopausal atrophic vaginitis 12/14/2004   Scoliosis    Senile osteoporosis 12/14/2004   Tension headache 10/04/2005   Unspecified cataract 11/26/2004   Unspecified constipation 01/21/2005   Unspecified urinary incontinence 11/26/2004   Vaginal stenosis 11/18/2014   Tight band about 1/2 inches into the vagina which will not allow passage of my index finger.    Past Surgical History:  Procedure Laterality Date   ABDOMINAL HYSTERECTOMY  1969   Dr.Braun  bilateral eyelid surgery  2005   Dr.Scott   BILATERAL OOPHORECTOMY  2002   BREAST SURGERY     benign tumor removal, left breast 1969   CATARACT EXTRACTION     Right   CHOLECYSTECTOMY  1990   Dr.Moore    COLONOSCOPY  3/18/20008   inflammation, diverticular associated colitis -Dr. Ardis Hughs   COSMETIC SURGERY     ESOPHAGOGASTRODUODENOSCOPY N/A 01/24/2014   Procedure: ESOPHAGOGASTRODUODENOSCOPY (EGD);   Surgeon: Irene Shipper, MD;  Location: [facility] ENDOSCOPY;  Service: Endoscopy;  Laterality: N/A;   ESOPHAGOGASTRODUODENOSCOPY ENDOSCOPY  04/04/14   Dr. Ardis Hughs   EYE SURGERY     FOREIGN BODY REMOVAL N/A 01/24/2014   Procedure: FOREIGN BODY REMOVAL;  Surgeon: Irene Shipper, MD;  Location: [facility];  Service: Endoscopy;  Laterality: N/A;   HIP PINNING,CANNULATED  11/07/2011   Procedure: CANNULATED HIP PINNING;  Surgeon: Jessy Oto, MD;  Location: [facility];  Service: Orthopedics;  Laterality: Left;   ORIF FEMORAL NECK FRACTURE W/ DHS  11/07/2011   Dr.Nitka    TONSILLECTOMY     Dr.Joe Tamala Julian   TUBAL LIGATION     VAGINAL PROLAPSE REPAIR  2002   Dr.Horback     Allergies  Allergen Reactions   Brexpiprazole Swelling   Lamictal [Lamotrigine] Swelling    Lip swelling   Penicillins Other (See Comments)    CHILDHOOD REACTION   Abilify [Aripiprazole] Rash    Outpatient Encounter Medications as of 03/17/2022  Medication Sig   Acetylcysteine 600 MG CAPS Take 1 capsule by mouth daily.   antiseptic oral rinse (BIOTENE) LIQD 15 mLs by Mouth Rinse route every 4 (four) hours as needed for dry mouth. Resident may self administer and keep in room   aspirin 81 MG chewable tablet Chew 81 mg by mouth daily.   Bismuth Subsalicylate (PEPTO-BISMOL PO) Take 30 mg by mouth as needed. For diarrhea   buPROPion (WELLBUTRIN XL) 300 MG 24 hr tablet Take 1 tablet (300 mg total) by mouth daily.   Calcium Carb-Cholecalciferol 600-800 MG-UNIT TABS Take 1 tablet by mouth 2 (two) times daily.   cholecalciferol (VITAMIN D) 1000 UNITS tablet Take 1,000 Units by mouth daily.   clonazePAM (KLONOPIN) 0.5 MG tablet Take by mouth 4 (four) times daily as needed for anxiety. Take 1/2 tablet up to 4 times daily as needed for anxiety.   docusate sodium (COLACE) 100 MG capsule Take 1 capsule (100 mg total) by mouth daily.   escitalopram (LEXAPRO) 20 MG tablet Take 1 tablet (20 mg total) by mouth daily.   Eyelid Cleansers (OCUSOFT EYELID  CLEANSING) PADS Apply 1 application topically daily.   GLUCOSAMINE HCL PO Take 2,000 mg by mouth 2 (two) times daily.   hydrocortisone 2.5 % ointment Apply 1 application topically 2 (two) times daily.   lubiprostone (AMITIZA) 8 MCG capsule Take 8 mcg by mouth 2 (two) times daily with a meal.   meloxicam (MOBIC) 7.5 MG tablet TAKE 1 TABLET BY MOUTH EVERY DAY AS NEEDED FOR ARTHRITIS   Menthol, Topical Analgesic, 4 % GEL Apply 1 application topically as needed. Resident may self administer and keep in room   miconazole (MICOTIN) 2 % powder Apply topically as needed for itching. apply to legs and feet once a day   mirtazapine (REMERON) 30 MG tablet Take 30 mg by mouth at bedtime.    Multiple Vitamin (MULTIVITAMIN WITH MINERALS) TABS Take 1 tablet by mouth daily.   Multiple Vitamins-Minerals (PRESERVISION AREDS 2+MULTI VIT PO) Take 1 tablet by  mouth 2 (two) times daily.   NIFEdipine (PROCARDIA XL/ADALAT-CC) 60 MG 24 hr tablet TAKE 1 TABLET BY MOUTH EVERY DAY TO CONTROL BLOOD PRESSURE   pantoprazole (PROTONIX) 40 MG tablet One daily to reduce stomach acid   Polyethyl Glycol-Propyl Glycol (SYSTANE) 0.4-0.3 % GEL ophthalmic gel Place 1 application into both eyes in the morning, at noon, in the evening, and at bedtime.   Polyethyl Glycol-Propyl Glycol (SYSTANE) 0.4-0.3 % GEL ophthalmic gel Place 1 application into both eyes as needed.   polyethylene glycol (MIRALAX / GLYCOLAX) 17 g packet Take 17 g by mouth daily. Mix 17g  with 8 oz. of fluid daily for constipation   pravastatin (PRAVACHOL) 10 MG tablet Take 10 mg by mouth daily.   [DISCONTINUED] LORazepam (ATIVAN) 0.5 MG tablet Take one tablet three times daily as needed for anxiety and take one extra tablet as needed for severe anxiety.   LORazepam (ATIVAN) 0.5 MG tablet Take one tablet three times daily as needed for anxiety and take one extra tablet as needed for severe anxiety.   [DISCONTINUED] acetaminophen-codeine (TYLENOL #3) 300-30 MG tablet Take 1  tablet by mouth every 6 (six) hours as needed for moderate pain.   [DISCONTINUED] chlorpheniramine-HYDROcodone (TUSSIONEX) 10-8 MG/5ML SUER Take 5 mLs by mouth. Give 5 ml q12H PRN cough, Resident may self administer and keep in room   [DISCONTINUED] econazole nitrate 1 % cream Apply topically. apply once daily until till resolved, may self administer, may keep at bedside   [DISCONTINUED] Polyethyl Glycol-Propyl Glycol (SYSTANE) 0.4-0.3 % SOLN Apply 1 drop to eye 2 (two) times daily as needed.   No facility-administered encounter medications on file as of 03/17/2022.    Review of Systems  Constitutional:  Negative for activity change and appetite change.  HENT: Negative.    Respiratory:  Negative for cough and shortness of breath.   Cardiovascular:  Negative for leg swelling.  Gastrointestinal:  Negative for constipation.  Genitourinary: Negative.   Musculoskeletal:  Negative for arthralgias, gait problem and myalgias.  Skin: Negative.   Neurological:  Negative for dizziness and weakness.  Psychiatric/Behavioral:  Negative for confusion, dysphoric mood and sleep disturbance. The patient is nervous/anxious.     Immunization History  Administered Date(s) Administered   Fluad Quad(high Dose 65+) 03/09/2022   Influenza Whole 02/14/2012, 02/14/2013   Influenza, High Dose Seasonal PF 03/14/2017, 02/26/2019   Influenza-Unspecified 02/27/2014, 02/12/2015, 02/25/2016, 03/15/2017, 02/19/2018, 02/18/2020, 03/09/2021   Moderna SARS-COV2 Booster Vaccination 10/21/2020   Moderna Sars-Covid-2 Vaccination 05/20/2019, 06/17/2019, 03/30/2020   PFIZER(Purple Top)SARS-COV-2 Vaccination 02/03/2021   Pneumococcal Conjugate-13 07/13/2015   Pneumococcal Polysaccharide-23 05/16/1998   Td 05/16/2004   Zoster Recombinat (Shingrix) 02/20/2018, 05/11/2018, 07/16/2018   Zoster, Live 05/16/2005   Pertinent  Health Maintenance Due  Topic Date Due   INFLUENZA VACCINE  Completed   DEXA SCAN  Completed    MAMMOGRAM  Discontinued      04/05/2018   12:08 PM 04/25/2018    2:43 PM 04/09/2019    1:22 PM 06/11/2021   11:40 AM 03/17/2022   11:51 AM  [place] in the past year? 0 0 0 1 0  Was there an injury with Fall?  0  0 0  Fall Risk Category Calculator  0  1 0  Fall Risk Category  Low  Low Low  Patient Fall Risk Level    Moderate fall risk Low fall risk  Patient at Risk for Falls Due to    History of fall(s);Impaired balance/gait;Impaired mobility;Impaired vision  History of fall(s);Impaired balance/gait  Fall risk Follow up    Falls evaluation completed;Education provided Falls evaluation completed   Functional Status Survey:    Vitals:   03/17/22 1120  BP: 110/64  Pulse: 75  Resp: 18  Temp: 98.2 F (36.8 C)  TempSrc: Temporal  SpO2: 94%  Weight: 142 lb 3.2 oz (64.5 kg)  Height: '5\' 2"'$  (1.575 m)   Body mass index is 26.01 kg/m. Physical Exam Vitals reviewed.  Constitutional:      Appearance: Normal appearance.  HENT:     Head: Normocephalic.     Nose: Nose normal.     Mouth/Throat:     Mouth: Mucous membranes are moist.     Pharynx: Oropharynx is clear.  Eyes:     Pupils: Pupils are equal, round, and reactive to light.  Cardiovascular:     Rate and Rhythm: Normal rate and regular rhythm.     Pulses: Normal pulses.     Heart sounds: Normal heart sounds. No murmur heard. Pulmonary:     Effort: Pulmonary effort is normal.     Breath sounds: Normal breath sounds.  Abdominal:     General: Abdomen is flat. Bowel sounds are normal.     Palpations: Abdomen is soft.  Musculoskeletal:        General: No swelling.     Cervical back: Neck supple.     Comments: Left arm swelling No Redness not that tender anymore  Skin:    General: Skin is warm.  Neurological:     General: No focal deficit present.     Mental Status: She is alert and oriented to person, place, and time.  Psychiatric:        Mood and Affect: Mood normal.        Thought Content: Thought content  normal.     Comments: Very anxious     Labs reviewed: No results for input(s): "NA", "K", "CL", "CO2", "GLUCOSE", "BUN", "CREATININE", "CALCIUM", "MG", "PHOS" in the last 8760 hours. No results for input(s): "AST", "ALT", "ALKPHOS", "BILITOT", "PROT", "ALBUMIN" in the last 8760 hours. No results for input(s): "WBC", "NEUTROABS", "HGB", "HCT", "MCV", "PLT" in the last 8760 hours. Lab Results  Component Value Date   TSH 2.10 03/08/2021   Lab Results  Component Value Date   HGBA1C 5.5 09/28/2018   Lab Results  Component Value Date   CHOL 164 03/08/2021   HDL 56 03/08/2021   LDLCALC 84 03/08/2021   TRIG 139 03/08/2021   CHOLHDL 3.5 08/04/2017    Significant Diagnostic Results in last 30 days:  No results found.  Assessment/Plan 1. Generalized anxiety disorder  - LORazepam (ATIVAN) 0.5 MG tablet; Take one tablet three times daily as needed for anxiety and take one extra tablet as needed for severe anxiety.  Dispense: 90 tablet; Refill: 0  2. Left arm swelling Due to Flu shot No Signs of infection Tylenol PRN for now    Family/ staff Communication:   Labs/tests ordered:

## 2022-03-20 DIAGNOSIS — R2681 Unsteadiness on feet: Secondary | ICD-10-CM | POA: Diagnosis not present

## 2022-03-20 DIAGNOSIS — M545 Low back pain, unspecified: Secondary | ICD-10-CM | POA: Diagnosis not present

## 2022-03-20 DIAGNOSIS — M6281 Muscle weakness (generalized): Secondary | ICD-10-CM | POA: Diagnosis not present

## 2022-03-20 DIAGNOSIS — M25512 Pain in left shoulder: Secondary | ICD-10-CM | POA: Diagnosis not present

## 2022-03-22 DIAGNOSIS — M6281 Muscle weakness (generalized): Secondary | ICD-10-CM | POA: Diagnosis not present

## 2022-03-22 DIAGNOSIS — M25512 Pain in left shoulder: Secondary | ICD-10-CM | POA: Diagnosis not present

## 2022-03-22 DIAGNOSIS — R2681 Unsteadiness on feet: Secondary | ICD-10-CM | POA: Diagnosis not present

## 2022-03-22 DIAGNOSIS — F411 Generalized anxiety disorder: Secondary | ICD-10-CM | POA: Diagnosis not present

## 2022-03-22 DIAGNOSIS — Z23 Encounter for immunization: Secondary | ICD-10-CM | POA: Diagnosis not present

## 2022-03-22 DIAGNOSIS — M545 Low back pain, unspecified: Secondary | ICD-10-CM | POA: Diagnosis not present

## 2022-03-23 DIAGNOSIS — M6281 Muscle weakness (generalized): Secondary | ICD-10-CM | POA: Diagnosis not present

## 2022-03-23 DIAGNOSIS — R2681 Unsteadiness on feet: Secondary | ICD-10-CM | POA: Diagnosis not present

## 2022-03-23 DIAGNOSIS — M545 Low back pain, unspecified: Secondary | ICD-10-CM | POA: Diagnosis not present

## 2022-03-23 DIAGNOSIS — M25512 Pain in left shoulder: Secondary | ICD-10-CM | POA: Diagnosis not present

## 2022-03-25 DIAGNOSIS — B351 Tinea unguium: Secondary | ICD-10-CM | POA: Diagnosis not present

## 2022-03-25 DIAGNOSIS — M79671 Pain in right foot: Secondary | ICD-10-CM | POA: Diagnosis not present

## 2022-03-25 DIAGNOSIS — M79672 Pain in left foot: Secondary | ICD-10-CM | POA: Diagnosis not present

## 2022-03-28 DIAGNOSIS — M545 Low back pain, unspecified: Secondary | ICD-10-CM | POA: Diagnosis not present

## 2022-03-28 DIAGNOSIS — M25512 Pain in left shoulder: Secondary | ICD-10-CM | POA: Diagnosis not present

## 2022-03-28 DIAGNOSIS — M6281 Muscle weakness (generalized): Secondary | ICD-10-CM | POA: Diagnosis not present

## 2022-03-28 DIAGNOSIS — R2681 Unsteadiness on feet: Secondary | ICD-10-CM | POA: Diagnosis not present

## 2022-03-29 DIAGNOSIS — M6281 Muscle weakness (generalized): Secondary | ICD-10-CM | POA: Diagnosis not present

## 2022-03-29 DIAGNOSIS — M25512 Pain in left shoulder: Secondary | ICD-10-CM | POA: Diagnosis not present

## 2022-03-29 DIAGNOSIS — M545 Low back pain, unspecified: Secondary | ICD-10-CM | POA: Diagnosis not present

## 2022-03-29 DIAGNOSIS — R2681 Unsteadiness on feet: Secondary | ICD-10-CM | POA: Diagnosis not present

## 2022-03-30 DIAGNOSIS — M545 Low back pain, unspecified: Secondary | ICD-10-CM | POA: Diagnosis not present

## 2022-03-30 DIAGNOSIS — M6281 Muscle weakness (generalized): Secondary | ICD-10-CM | POA: Diagnosis not present

## 2022-03-30 DIAGNOSIS — M25512 Pain in left shoulder: Secondary | ICD-10-CM | POA: Diagnosis not present

## 2022-03-30 DIAGNOSIS — R2681 Unsteadiness on feet: Secondary | ICD-10-CM | POA: Diagnosis not present

## 2022-04-04 DIAGNOSIS — M25512 Pain in left shoulder: Secondary | ICD-10-CM | POA: Diagnosis not present

## 2022-04-04 DIAGNOSIS — R2681 Unsteadiness on feet: Secondary | ICD-10-CM | POA: Diagnosis not present

## 2022-04-04 DIAGNOSIS — M545 Low back pain, unspecified: Secondary | ICD-10-CM | POA: Diagnosis not present

## 2022-04-04 DIAGNOSIS — M6281 Muscle weakness (generalized): Secondary | ICD-10-CM | POA: Diagnosis not present

## 2022-04-05 DIAGNOSIS — R2681 Unsteadiness on feet: Secondary | ICD-10-CM | POA: Diagnosis not present

## 2022-04-05 DIAGNOSIS — M6281 Muscle weakness (generalized): Secondary | ICD-10-CM | POA: Diagnosis not present

## 2022-04-05 DIAGNOSIS — M25512 Pain in left shoulder: Secondary | ICD-10-CM | POA: Diagnosis not present

## 2022-04-05 DIAGNOSIS — M545 Low back pain, unspecified: Secondary | ICD-10-CM | POA: Diagnosis not present

## 2022-04-06 DIAGNOSIS — M6281 Muscle weakness (generalized): Secondary | ICD-10-CM | POA: Diagnosis not present

## 2022-04-06 DIAGNOSIS — M545 Low back pain, unspecified: Secondary | ICD-10-CM | POA: Diagnosis not present

## 2022-04-06 DIAGNOSIS — R2681 Unsteadiness on feet: Secondary | ICD-10-CM | POA: Diagnosis not present

## 2022-04-06 DIAGNOSIS — M25512 Pain in left shoulder: Secondary | ICD-10-CM | POA: Diagnosis not present

## 2022-04-11 DIAGNOSIS — M6281 Muscle weakness (generalized): Secondary | ICD-10-CM | POA: Diagnosis not present

## 2022-04-11 DIAGNOSIS — M545 Low back pain, unspecified: Secondary | ICD-10-CM | POA: Diagnosis not present

## 2022-04-11 DIAGNOSIS — M25512 Pain in left shoulder: Secondary | ICD-10-CM | POA: Diagnosis not present

## 2022-04-11 DIAGNOSIS — R2681 Unsteadiness on feet: Secondary | ICD-10-CM | POA: Diagnosis not present

## 2022-04-12 DIAGNOSIS — M25512 Pain in left shoulder: Secondary | ICD-10-CM | POA: Diagnosis not present

## 2022-04-12 DIAGNOSIS — M6281 Muscle weakness (generalized): Secondary | ICD-10-CM | POA: Diagnosis not present

## 2022-04-12 DIAGNOSIS — M545 Low back pain, unspecified: Secondary | ICD-10-CM | POA: Diagnosis not present

## 2022-04-12 DIAGNOSIS — R2681 Unsteadiness on feet: Secondary | ICD-10-CM | POA: Diagnosis not present

## 2022-04-13 DIAGNOSIS — M6281 Muscle weakness (generalized): Secondary | ICD-10-CM | POA: Diagnosis not present

## 2022-04-13 DIAGNOSIS — M25512 Pain in left shoulder: Secondary | ICD-10-CM | POA: Diagnosis not present

## 2022-04-13 DIAGNOSIS — M545 Low back pain, unspecified: Secondary | ICD-10-CM | POA: Diagnosis not present

## 2022-04-13 DIAGNOSIS — R2681 Unsteadiness on feet: Secondary | ICD-10-CM | POA: Diagnosis not present

## 2022-04-17 DIAGNOSIS — R41841 Cognitive communication deficit: Secondary | ICD-10-CM | POA: Diagnosis not present

## 2022-04-17 DIAGNOSIS — R2681 Unsteadiness on feet: Secondary | ICD-10-CM | POA: Diagnosis not present

## 2022-04-17 DIAGNOSIS — M25512 Pain in left shoulder: Secondary | ICD-10-CM | POA: Diagnosis not present

## 2022-04-17 DIAGNOSIS — M545 Low back pain, unspecified: Secondary | ICD-10-CM | POA: Diagnosis not present

## 2022-04-17 DIAGNOSIS — M6281 Muscle weakness (generalized): Secondary | ICD-10-CM | POA: Diagnosis not present

## 2022-04-17 DIAGNOSIS — F419 Anxiety disorder, unspecified: Secondary | ICD-10-CM | POA: Diagnosis not present

## 2022-04-19 DIAGNOSIS — M545 Low back pain, unspecified: Secondary | ICD-10-CM | POA: Diagnosis not present

## 2022-04-19 DIAGNOSIS — R41841 Cognitive communication deficit: Secondary | ICD-10-CM | POA: Diagnosis not present

## 2022-04-19 DIAGNOSIS — M25512 Pain in left shoulder: Secondary | ICD-10-CM | POA: Diagnosis not present

## 2022-04-19 DIAGNOSIS — F419 Anxiety disorder, unspecified: Secondary | ICD-10-CM | POA: Diagnosis not present

## 2022-04-19 DIAGNOSIS — F411 Generalized anxiety disorder: Secondary | ICD-10-CM | POA: Diagnosis not present

## 2022-04-19 DIAGNOSIS — R2681 Unsteadiness on feet: Secondary | ICD-10-CM | POA: Diagnosis not present

## 2022-04-19 DIAGNOSIS — M6281 Muscle weakness (generalized): Secondary | ICD-10-CM | POA: Diagnosis not present

## 2022-04-20 DIAGNOSIS — M545 Low back pain, unspecified: Secondary | ICD-10-CM | POA: Diagnosis not present

## 2022-04-20 DIAGNOSIS — R41841 Cognitive communication deficit: Secondary | ICD-10-CM | POA: Diagnosis not present

## 2022-04-20 DIAGNOSIS — M6281 Muscle weakness (generalized): Secondary | ICD-10-CM | POA: Diagnosis not present

## 2022-04-20 DIAGNOSIS — M25512 Pain in left shoulder: Secondary | ICD-10-CM | POA: Diagnosis not present

## 2022-04-20 DIAGNOSIS — F419 Anxiety disorder, unspecified: Secondary | ICD-10-CM | POA: Diagnosis not present

## 2022-04-20 DIAGNOSIS — R2681 Unsteadiness on feet: Secondary | ICD-10-CM | POA: Diagnosis not present

## 2022-04-21 DIAGNOSIS — F419 Anxiety disorder, unspecified: Secondary | ICD-10-CM | POA: Diagnosis not present

## 2022-04-21 DIAGNOSIS — M6281 Muscle weakness (generalized): Secondary | ICD-10-CM | POA: Diagnosis not present

## 2022-04-21 DIAGNOSIS — M25512 Pain in left shoulder: Secondary | ICD-10-CM | POA: Diagnosis not present

## 2022-04-21 DIAGNOSIS — M545 Low back pain, unspecified: Secondary | ICD-10-CM | POA: Diagnosis not present

## 2022-04-21 DIAGNOSIS — R41841 Cognitive communication deficit: Secondary | ICD-10-CM | POA: Diagnosis not present

## 2022-04-21 DIAGNOSIS — R2681 Unsteadiness on feet: Secondary | ICD-10-CM | POA: Diagnosis not present

## 2022-04-22 DIAGNOSIS — M25512 Pain in left shoulder: Secondary | ICD-10-CM | POA: Diagnosis not present

## 2022-04-22 DIAGNOSIS — R2681 Unsteadiness on feet: Secondary | ICD-10-CM | POA: Diagnosis not present

## 2022-04-22 DIAGNOSIS — M6281 Muscle weakness (generalized): Secondary | ICD-10-CM | POA: Diagnosis not present

## 2022-04-22 DIAGNOSIS — R41841 Cognitive communication deficit: Secondary | ICD-10-CM | POA: Diagnosis not present

## 2022-04-22 DIAGNOSIS — M545 Low back pain, unspecified: Secondary | ICD-10-CM | POA: Diagnosis not present

## 2022-04-22 DIAGNOSIS — F419 Anxiety disorder, unspecified: Secondary | ICD-10-CM | POA: Diagnosis not present

## 2022-04-25 DIAGNOSIS — M545 Low back pain, unspecified: Secondary | ICD-10-CM | POA: Diagnosis not present

## 2022-04-25 DIAGNOSIS — R41841 Cognitive communication deficit: Secondary | ICD-10-CM | POA: Diagnosis not present

## 2022-04-25 DIAGNOSIS — R2681 Unsteadiness on feet: Secondary | ICD-10-CM | POA: Diagnosis not present

## 2022-04-25 DIAGNOSIS — F419 Anxiety disorder, unspecified: Secondary | ICD-10-CM | POA: Diagnosis not present

## 2022-04-25 DIAGNOSIS — M25512 Pain in left shoulder: Secondary | ICD-10-CM | POA: Diagnosis not present

## 2022-04-25 DIAGNOSIS — M6281 Muscle weakness (generalized): Secondary | ICD-10-CM | POA: Diagnosis not present

## 2022-04-26 DIAGNOSIS — F419 Anxiety disorder, unspecified: Secondary | ICD-10-CM | POA: Diagnosis not present

## 2022-04-26 DIAGNOSIS — R2681 Unsteadiness on feet: Secondary | ICD-10-CM | POA: Diagnosis not present

## 2022-04-26 DIAGNOSIS — M545 Low back pain, unspecified: Secondary | ICD-10-CM | POA: Diagnosis not present

## 2022-04-26 DIAGNOSIS — R41841 Cognitive communication deficit: Secondary | ICD-10-CM | POA: Diagnosis not present

## 2022-04-26 DIAGNOSIS — M25512 Pain in left shoulder: Secondary | ICD-10-CM | POA: Diagnosis not present

## 2022-04-26 DIAGNOSIS — M6281 Muscle weakness (generalized): Secondary | ICD-10-CM | POA: Diagnosis not present

## 2022-04-27 DIAGNOSIS — M6281 Muscle weakness (generalized): Secondary | ICD-10-CM | POA: Diagnosis not present

## 2022-04-27 DIAGNOSIS — R2681 Unsteadiness on feet: Secondary | ICD-10-CM | POA: Diagnosis not present

## 2022-04-27 DIAGNOSIS — R41841 Cognitive communication deficit: Secondary | ICD-10-CM | POA: Diagnosis not present

## 2022-04-27 DIAGNOSIS — M25512 Pain in left shoulder: Secondary | ICD-10-CM | POA: Diagnosis not present

## 2022-04-27 DIAGNOSIS — M545 Low back pain, unspecified: Secondary | ICD-10-CM | POA: Diagnosis not present

## 2022-04-27 DIAGNOSIS — F419 Anxiety disorder, unspecified: Secondary | ICD-10-CM | POA: Diagnosis not present

## 2022-04-29 DIAGNOSIS — M6281 Muscle weakness (generalized): Secondary | ICD-10-CM | POA: Diagnosis not present

## 2022-04-29 DIAGNOSIS — R41841 Cognitive communication deficit: Secondary | ICD-10-CM | POA: Diagnosis not present

## 2022-04-29 DIAGNOSIS — R2681 Unsteadiness on feet: Secondary | ICD-10-CM | POA: Diagnosis not present

## 2022-04-29 DIAGNOSIS — F419 Anxiety disorder, unspecified: Secondary | ICD-10-CM | POA: Diagnosis not present

## 2022-04-29 DIAGNOSIS — M25512 Pain in left shoulder: Secondary | ICD-10-CM | POA: Diagnosis not present

## 2022-04-29 DIAGNOSIS — M545 Low back pain, unspecified: Secondary | ICD-10-CM | POA: Diagnosis not present

## 2022-05-02 DIAGNOSIS — R2681 Unsteadiness on feet: Secondary | ICD-10-CM | POA: Diagnosis not present

## 2022-05-02 DIAGNOSIS — M6281 Muscle weakness (generalized): Secondary | ICD-10-CM | POA: Diagnosis not present

## 2022-05-02 DIAGNOSIS — F419 Anxiety disorder, unspecified: Secondary | ICD-10-CM | POA: Diagnosis not present

## 2022-05-02 DIAGNOSIS — M545 Low back pain, unspecified: Secondary | ICD-10-CM | POA: Diagnosis not present

## 2022-05-02 DIAGNOSIS — R41841 Cognitive communication deficit: Secondary | ICD-10-CM | POA: Diagnosis not present

## 2022-05-02 DIAGNOSIS — M25512 Pain in left shoulder: Secondary | ICD-10-CM | POA: Diagnosis not present

## 2022-05-03 DIAGNOSIS — F419 Anxiety disorder, unspecified: Secondary | ICD-10-CM | POA: Diagnosis not present

## 2022-05-03 DIAGNOSIS — R41841 Cognitive communication deficit: Secondary | ICD-10-CM | POA: Diagnosis not present

## 2022-05-03 DIAGNOSIS — M25512 Pain in left shoulder: Secondary | ICD-10-CM | POA: Diagnosis not present

## 2022-05-03 DIAGNOSIS — M6281 Muscle weakness (generalized): Secondary | ICD-10-CM | POA: Diagnosis not present

## 2022-05-03 DIAGNOSIS — M545 Low back pain, unspecified: Secondary | ICD-10-CM | POA: Diagnosis not present

## 2022-05-03 DIAGNOSIS — R2681 Unsteadiness on feet: Secondary | ICD-10-CM | POA: Diagnosis not present

## 2022-05-04 DIAGNOSIS — R2681 Unsteadiness on feet: Secondary | ICD-10-CM | POA: Diagnosis not present

## 2022-05-04 DIAGNOSIS — F419 Anxiety disorder, unspecified: Secondary | ICD-10-CM | POA: Diagnosis not present

## 2022-05-04 DIAGNOSIS — M6281 Muscle weakness (generalized): Secondary | ICD-10-CM | POA: Diagnosis not present

## 2022-05-04 DIAGNOSIS — M25512 Pain in left shoulder: Secondary | ICD-10-CM | POA: Diagnosis not present

## 2022-05-04 DIAGNOSIS — R41841 Cognitive communication deficit: Secondary | ICD-10-CM | POA: Diagnosis not present

## 2022-05-04 DIAGNOSIS — M545 Low back pain, unspecified: Secondary | ICD-10-CM | POA: Diagnosis not present

## 2022-05-11 DIAGNOSIS — R41841 Cognitive communication deficit: Secondary | ICD-10-CM | POA: Diagnosis not present

## 2022-05-11 DIAGNOSIS — R2681 Unsteadiness on feet: Secondary | ICD-10-CM | POA: Diagnosis not present

## 2022-05-11 DIAGNOSIS — M545 Low back pain, unspecified: Secondary | ICD-10-CM | POA: Diagnosis not present

## 2022-05-11 DIAGNOSIS — M25512 Pain in left shoulder: Secondary | ICD-10-CM | POA: Diagnosis not present

## 2022-05-11 DIAGNOSIS — F419 Anxiety disorder, unspecified: Secondary | ICD-10-CM | POA: Diagnosis not present

## 2022-05-11 DIAGNOSIS — M6281 Muscle weakness (generalized): Secondary | ICD-10-CM | POA: Diagnosis not present

## 2022-05-13 ENCOUNTER — Other Ambulatory Visit: Payer: Self-pay | Admitting: Nurse Practitioner

## 2022-05-13 DIAGNOSIS — F411 Generalized anxiety disorder: Secondary | ICD-10-CM

## 2022-05-13 MED ORDER — LORAZEPAM 0.5 MG PO TABS: ORAL_TABLET | ORAL | 0 refills | Status: DC

## 2022-05-15 DIAGNOSIS — M25512 Pain in left shoulder: Secondary | ICD-10-CM | POA: Diagnosis not present

## 2022-05-15 DIAGNOSIS — M545 Low back pain, unspecified: Secondary | ICD-10-CM | POA: Diagnosis not present

## 2022-05-15 DIAGNOSIS — R41841 Cognitive communication deficit: Secondary | ICD-10-CM | POA: Diagnosis not present

## 2022-05-15 DIAGNOSIS — M6281 Muscle weakness (generalized): Secondary | ICD-10-CM | POA: Diagnosis not present

## 2022-05-15 DIAGNOSIS — F419 Anxiety disorder, unspecified: Secondary | ICD-10-CM | POA: Diagnosis not present

## 2022-05-15 DIAGNOSIS — R2681 Unsteadiness on feet: Secondary | ICD-10-CM | POA: Diagnosis not present

## 2022-05-17 DIAGNOSIS — R41841 Cognitive communication deficit: Secondary | ICD-10-CM | POA: Diagnosis not present

## 2022-05-17 DIAGNOSIS — R293 Abnormal posture: Secondary | ICD-10-CM | POA: Diagnosis not present

## 2022-05-17 DIAGNOSIS — M545 Low back pain, unspecified: Secondary | ICD-10-CM | POA: Diagnosis not present

## 2022-05-17 DIAGNOSIS — M6281 Muscle weakness (generalized): Secondary | ICD-10-CM | POA: Diagnosis not present

## 2022-05-17 DIAGNOSIS — F419 Anxiety disorder, unspecified: Secondary | ICD-10-CM | POA: Diagnosis not present

## 2022-05-17 DIAGNOSIS — M25512 Pain in left shoulder: Secondary | ICD-10-CM | POA: Diagnosis not present

## 2022-05-17 DIAGNOSIS — R2681 Unsteadiness on feet: Secondary | ICD-10-CM | POA: Diagnosis not present

## 2022-05-18 DIAGNOSIS — R293 Abnormal posture: Secondary | ICD-10-CM | POA: Diagnosis not present

## 2022-05-18 DIAGNOSIS — R2681 Unsteadiness on feet: Secondary | ICD-10-CM | POA: Diagnosis not present

## 2022-05-18 DIAGNOSIS — M6281 Muscle weakness (generalized): Secondary | ICD-10-CM | POA: Diagnosis not present

## 2022-05-18 DIAGNOSIS — F419 Anxiety disorder, unspecified: Secondary | ICD-10-CM | POA: Diagnosis not present

## 2022-05-18 DIAGNOSIS — M545 Low back pain, unspecified: Secondary | ICD-10-CM | POA: Diagnosis not present

## 2022-05-18 DIAGNOSIS — R41841 Cognitive communication deficit: Secondary | ICD-10-CM | POA: Diagnosis not present

## 2022-05-22 DIAGNOSIS — F419 Anxiety disorder, unspecified: Secondary | ICD-10-CM | POA: Diagnosis not present

## 2022-05-22 DIAGNOSIS — M6281 Muscle weakness (generalized): Secondary | ICD-10-CM | POA: Diagnosis not present

## 2022-05-22 DIAGNOSIS — R41841 Cognitive communication deficit: Secondary | ICD-10-CM | POA: Diagnosis not present

## 2022-05-22 DIAGNOSIS — R293 Abnormal posture: Secondary | ICD-10-CM | POA: Diagnosis not present

## 2022-05-22 DIAGNOSIS — R2681 Unsteadiness on feet: Secondary | ICD-10-CM | POA: Diagnosis not present

## 2022-05-22 DIAGNOSIS — M545 Low back pain, unspecified: Secondary | ICD-10-CM | POA: Diagnosis not present

## 2022-05-24 DIAGNOSIS — F419 Anxiety disorder, unspecified: Secondary | ICD-10-CM | POA: Diagnosis not present

## 2022-05-24 DIAGNOSIS — R41841 Cognitive communication deficit: Secondary | ICD-10-CM | POA: Diagnosis not present

## 2022-05-24 DIAGNOSIS — M6281 Muscle weakness (generalized): Secondary | ICD-10-CM | POA: Diagnosis not present

## 2022-05-24 DIAGNOSIS — R2681 Unsteadiness on feet: Secondary | ICD-10-CM | POA: Diagnosis not present

## 2022-05-24 DIAGNOSIS — M545 Low back pain, unspecified: Secondary | ICD-10-CM | POA: Diagnosis not present

## 2022-05-24 DIAGNOSIS — R293 Abnormal posture: Secondary | ICD-10-CM | POA: Diagnosis not present

## 2022-05-25 DIAGNOSIS — F419 Anxiety disorder, unspecified: Secondary | ICD-10-CM | POA: Diagnosis not present

## 2022-05-25 DIAGNOSIS — R2681 Unsteadiness on feet: Secondary | ICD-10-CM | POA: Diagnosis not present

## 2022-05-25 DIAGNOSIS — M545 Low back pain, unspecified: Secondary | ICD-10-CM | POA: Diagnosis not present

## 2022-05-25 DIAGNOSIS — R41841 Cognitive communication deficit: Secondary | ICD-10-CM | POA: Diagnosis not present

## 2022-05-25 DIAGNOSIS — R293 Abnormal posture: Secondary | ICD-10-CM | POA: Diagnosis not present

## 2022-05-25 DIAGNOSIS — M6281 Muscle weakness (generalized): Secondary | ICD-10-CM | POA: Diagnosis not present

## 2022-05-29 DIAGNOSIS — F419 Anxiety disorder, unspecified: Secondary | ICD-10-CM | POA: Diagnosis not present

## 2022-05-29 DIAGNOSIS — M6281 Muscle weakness (generalized): Secondary | ICD-10-CM | POA: Diagnosis not present

## 2022-05-29 DIAGNOSIS — R41841 Cognitive communication deficit: Secondary | ICD-10-CM | POA: Diagnosis not present

## 2022-05-29 DIAGNOSIS — R2681 Unsteadiness on feet: Secondary | ICD-10-CM | POA: Diagnosis not present

## 2022-05-29 DIAGNOSIS — R293 Abnormal posture: Secondary | ICD-10-CM | POA: Diagnosis not present

## 2022-05-29 DIAGNOSIS — M545 Low back pain, unspecified: Secondary | ICD-10-CM | POA: Diagnosis not present

## 2022-05-31 DIAGNOSIS — M6281 Muscle weakness (generalized): Secondary | ICD-10-CM | POA: Diagnosis not present

## 2022-05-31 DIAGNOSIS — F419 Anxiety disorder, unspecified: Secondary | ICD-10-CM | POA: Diagnosis not present

## 2022-05-31 DIAGNOSIS — M545 Low back pain, unspecified: Secondary | ICD-10-CM | POA: Diagnosis not present

## 2022-05-31 DIAGNOSIS — R41841 Cognitive communication deficit: Secondary | ICD-10-CM | POA: Diagnosis not present

## 2022-05-31 DIAGNOSIS — R2681 Unsteadiness on feet: Secondary | ICD-10-CM | POA: Diagnosis not present

## 2022-05-31 DIAGNOSIS — R293 Abnormal posture: Secondary | ICD-10-CM | POA: Diagnosis not present

## 2022-06-01 DIAGNOSIS — M6281 Muscle weakness (generalized): Secondary | ICD-10-CM | POA: Diagnosis not present

## 2022-06-01 DIAGNOSIS — F419 Anxiety disorder, unspecified: Secondary | ICD-10-CM | POA: Diagnosis not present

## 2022-06-01 DIAGNOSIS — F411 Generalized anxiety disorder: Secondary | ICD-10-CM | POA: Diagnosis not present

## 2022-06-01 DIAGNOSIS — M545 Low back pain, unspecified: Secondary | ICD-10-CM | POA: Diagnosis not present

## 2022-06-01 DIAGNOSIS — R41841 Cognitive communication deficit: Secondary | ICD-10-CM | POA: Diagnosis not present

## 2022-06-01 DIAGNOSIS — R2681 Unsteadiness on feet: Secondary | ICD-10-CM | POA: Diagnosis not present

## 2022-06-01 DIAGNOSIS — R293 Abnormal posture: Secondary | ICD-10-CM | POA: Diagnosis not present

## 2022-06-03 DIAGNOSIS — M79672 Pain in left foot: Secondary | ICD-10-CM | POA: Diagnosis not present

## 2022-06-03 DIAGNOSIS — B351 Tinea unguium: Secondary | ICD-10-CM | POA: Diagnosis not present

## 2022-06-03 DIAGNOSIS — M79671 Pain in right foot: Secondary | ICD-10-CM | POA: Diagnosis not present

## 2022-06-05 DIAGNOSIS — R293 Abnormal posture: Secondary | ICD-10-CM | POA: Diagnosis not present

## 2022-06-05 DIAGNOSIS — M6281 Muscle weakness (generalized): Secondary | ICD-10-CM | POA: Diagnosis not present

## 2022-06-05 DIAGNOSIS — R2681 Unsteadiness on feet: Secondary | ICD-10-CM | POA: Diagnosis not present

## 2022-06-05 DIAGNOSIS — F419 Anxiety disorder, unspecified: Secondary | ICD-10-CM | POA: Diagnosis not present

## 2022-06-05 DIAGNOSIS — M545 Low back pain, unspecified: Secondary | ICD-10-CM | POA: Diagnosis not present

## 2022-06-05 DIAGNOSIS — R41841 Cognitive communication deficit: Secondary | ICD-10-CM | POA: Diagnosis not present

## 2022-06-06 ENCOUNTER — Other Ambulatory Visit: Payer: Self-pay

## 2022-06-06 DIAGNOSIS — F411 Generalized anxiety disorder: Secondary | ICD-10-CM

## 2022-06-06 MED ORDER — LORAZEPAM 0.5 MG PO TABS: ORAL_TABLET | ORAL | 0 refills | Status: DC

## 2022-06-06 NOTE — Telephone Encounter (Signed)
Assisted Living Resident @ [facility]  Incoming fax received from [facility] requesting   Lorazepam 0.'5mg'$ , take 1 tablet by mouth three times a day as needed for anxiety. Take 1 tablet extra as needed for sever anxiety as directed

## 2022-06-07 DIAGNOSIS — M6281 Muscle weakness (generalized): Secondary | ICD-10-CM | POA: Diagnosis not present

## 2022-06-07 DIAGNOSIS — R41841 Cognitive communication deficit: Secondary | ICD-10-CM | POA: Diagnosis not present

## 2022-06-07 DIAGNOSIS — M545 Low back pain, unspecified: Secondary | ICD-10-CM | POA: Diagnosis not present

## 2022-06-07 DIAGNOSIS — F419 Anxiety disorder, unspecified: Secondary | ICD-10-CM | POA: Diagnosis not present

## 2022-06-07 DIAGNOSIS — R2681 Unsteadiness on feet: Secondary | ICD-10-CM | POA: Diagnosis not present

## 2022-06-07 DIAGNOSIS — R293 Abnormal posture: Secondary | ICD-10-CM | POA: Diagnosis not present

## 2022-06-09 ENCOUNTER — Non-Acute Institutional Stay: Payer: Medicare Other | Admitting: Internal Medicine

## 2022-06-09 ENCOUNTER — Encounter: Payer: Self-pay | Admitting: Internal Medicine

## 2022-06-09 DIAGNOSIS — F411 Generalized anxiety disorder: Secondary | ICD-10-CM | POA: Diagnosis not present

## 2022-06-09 DIAGNOSIS — F429 Obsessive-compulsive disorder, unspecified: Secondary | ICD-10-CM

## 2022-06-09 DIAGNOSIS — I10 Essential (primary) hypertension: Secondary | ICD-10-CM

## 2022-06-09 DIAGNOSIS — E785 Hyperlipidemia, unspecified: Secondary | ICD-10-CM

## 2022-06-09 DIAGNOSIS — R42 Dizziness and giddiness: Secondary | ICD-10-CM

## 2022-06-09 DIAGNOSIS — F339 Major depressive disorder, recurrent, unspecified: Secondary | ICD-10-CM

## 2022-06-09 DIAGNOSIS — K219 Gastro-esophageal reflux disease without esophagitis: Secondary | ICD-10-CM | POA: Diagnosis not present

## 2022-06-09 DIAGNOSIS — M150 Primary generalized (osteo)arthritis: Secondary | ICD-10-CM

## 2022-06-09 DIAGNOSIS — K5900 Constipation, unspecified: Secondary | ICD-10-CM | POA: Diagnosis not present

## 2022-06-09 DIAGNOSIS — M159 Polyosteoarthritis, unspecified: Secondary | ICD-10-CM | POA: Diagnosis not present

## 2022-06-09 NOTE — Progress Notes (Signed)
Location:  [facility] Room Number: [place] of Service:  ALF [phone]) Provider:  Virgie Dad, MD   Virgie Dad, MD  Patient Care Team: Virgie Dad, MD as PCP - General (Internal Medicine) Melina Modena, [place] Clent Jacks, MD as Consulting Physician (Ophthalmology) Milus Banister, MD as Consulting Physician (Gastroenterology) Norma Fredrickson, MD as Consulting Physician (Psychiatry) Jessy Oto, MD (Inactive) as Consulting Physician (Orthopedic Surgery) Bjorn Loser, MD as Consulting Physician (Urology)  Extended Emergency Contact Information Primary Emergency Contact: [facility] Phone: [phone] Relation: Son Secondary Emergency Contact: [place], [place] [address] [address] Phone: [phone] Mobile Phone: [phone] Relation: Son  Code Status:  Full code Goals of care: Advanced Directive information    03/17/2022   11:52 AM  Advanced Directives  Does Patient Have a Medical Advance Directive? Yes  Type of Advance Directive Living will     Chief Complaint  Patient presents with   Medical Management of Chronic Issues    Routine Visit   Quality Metric Gaps    Patient is Due for a AWV    HPI:  Pt is a 89 y.o. female seen today for medical management of chronic diseases.    Lives in [place]   Patient has h/o Major Depression with Anxiety, Hyperlipidemia, Chronic Bilateral Low Back Pain, Spinal Stenosis , Osteoarthritis, history of osteoporosis, hypertension Dizziness Per Neurology No more work up  Patient continues to struggle with Anxiety and with it Dizziness She talks to her Psychologist for these symptoms Other wise stable and no new issues Walks with her walker Supportive family Stays in her room mostly Wt Readings from Last 3 Encounters:  06/09/22 141 lb 6.4 oz (64.1 kg)  03/17/22 142 lb 3.2 oz (64.5 kg)  02/08/22 141 lb 3.2 oz (64 kg)     Past Medical History:   Diagnosis Date   Abnormal mammogram, unspecified 04/19/2011   Allergy    Anxiety    Backache, unspecified 01/21/2005   Cervicalgia 08/20/2013   Chronic kidney disease, stage II (mild) 04/03/2009   Chronic rhinitis 09/28/2010   DDD (degenerative disc disease)    Depression    Dermatophytosis of nail 01/21/2005   Diverticulosis of colon (without mention of hemorrhage) 06/17/2005   Edema 11/26/2004   GERD (gastroesophageal reflux disease)    Hiatal hernia    High cholesterol    Hyperglycemia 11/18/2014   Hypertension    Insomnia, unspecified 10/18/2011   Left inguinal hernia 04/24/2014   Nocturia 09/19/2008   Osteoporosis, senile 11/18/2014   Other abnormal blood chemistry 04/03/2007   Pain in joint, pelvic region and thigh 11/12/2011   Pain in joint, shoulder region 08/15/2006   Pain in limb 08/26/2005   Palpitations 09/29/2009   PAOD (peripheral arterial occlusive disease) ([place]) 11/18/2014   Right leg; diminished popliteal, dorsalis pedis, and posterior tibial artery pulsations    Postmenopausal atrophic vaginitis 12/14/2004   Scoliosis    Senile osteoporosis 12/14/2004   Tension headache 10/04/2005   Unspecified cataract 11/26/2004   Unspecified constipation 01/21/2005   Unspecified urinary incontinence 11/26/2004   Vaginal stenosis 11/18/2014   Tight band about 1/2 inches into the vagina which will not allow passage of my index finger.    Past Surgical History:  Procedure Laterality Date   ABDOMINAL HYSTERECTOMY  1969   Dr.Braun    bilateral eyelid surgery  2005   Dr.Scott   BILATERAL OOPHORECTOMY  2002   BREAST SURGERY     benign tumor removal, left breast 1969   CATARACT EXTRACTION     Right   CHOLECYSTECTOMY  1990   Dr.Moore    COLONOSCOPY  3/18/20008   inflammation, diverticular associated colitis -Dr. Ardis Hughs   COSMETIC SURGERY     ESOPHAGOGASTRODUODENOSCOPY N/A 01/24/2014   Procedure: ESOPHAGOGASTRODUODENOSCOPY (EGD);  Surgeon: Irene Shipper, MD;  Location[facility] END[facility]COPY;  Service: Endoscopy;   Laterality: N/A;   ESOPHAGOGASTRODUODENOSCOPY ENDOSCOPY  04/04/14   Dr. Ardis Hughs   EYE SURGERY     FOREIGN BODY REMOVAL N/A 01/24/2014   Procedure: FOREIGN BODY REMOVAL;  Surgeon: Irene Shipper, MD;  Location: [facility];  Service: Endoscopy;  Laterality: N/A;   HIP PINNING,CANNULATED  11/07/2011   Procedure: CANNULATED HIP PINNING;  Surgeon: Jessy Oto, MD;  Location: [facility];  Service: Orthopedics;  Laterality: Left;   ORIF FEMORAL NECK FRACTURE W/ DHS  11/07/2011   Dr.Nitka    TONSILLECTOMY     Dr.Joe Tamala Julian   TUBAL LIGATION     VAGINAL PROLAPSE REPAIR  2002   Dr.Horback     Allergies  Allergen Reactions   Brexpiprazole Swelling   Lamictal [Lamotrigine] Swelling    Lip swelling   Penicillins Other (See Comments)    CHILDHOOD REACTION   Abilify [Aripiprazole] Rash    Outpatient Encounter Medications as of 06/09/2022  Medication Sig   Acetylcysteine 600 MG CAPS Take 1 capsule by mouth daily.   antiseptic oral rinse (BIOTENE) LIQD 15 mLs by Mouth Rinse route every 4 (four) hours as needed for dry mouth. Resident may self administer and keep in room   aspirin 81 MG chewable tablet Chew 81 mg by mouth daily.   Bismuth Subsalicylate (PEPTO-BISMOL PO) Take 30 mg by mouth as needed. For diarrhea   buPROPion (WELLBUTRIN XL) 300 MG 24 hr tablet Take 1 tablet (300 mg total) by mouth daily.   Calcium Carb-Cholecalciferol 600-800 MG-UNIT TABS Take 1 tablet by mouth 2 (two) times daily.   cholecalciferol (VITAMIN D) 1000 UNITS tablet Take 1,000 Units by mouth daily.   clonazePAM (KLONOPIN) 0.5 MG tablet Take by mouth 4 (four) times daily as needed for anxiety. Take 1/2 tablet up to 4 times daily as needed for anxiety.   docusate sodium (COLACE) 100 MG capsule Take 1 capsule (100 mg total) by mouth daily.   escitalopram (LEXAPRO) 20 MG tablet Take 1 tablet (20 mg total) by mouth daily.   Eyelid Cleansers (OCUSOFT EYELID CLEANSING) PADS Apply 1 application topically daily.   GLUCOSAMINE HCL  PO Take 2,000 mg by mouth 2 (two) times daily.   hydrocortisone 2.5 % ointment Apply 1 application topically 2 (two) times daily.   LORazepam (ATIVAN) 0.5 MG tablet Take one tablet three times daily as needed for anxiety and take one extra tablet as needed for severe anxiety.   lubiprostone (AMITIZA) 8 MCG capsule Take 8 mcg by mouth 2 (two) times daily with a meal.   meloxicam (MOBIC) 7.5 MG tablet TAKE 1 TABLET BY MOUTH EVERY DAY AS NEEDED FOR ARTHRITIS   miconazole (MICOTIN) 2 % powder Apply topically as needed for itching. apply to legs and feet once a day   mirtazapine (REMERON) 30 MG tablet Take 30 mg by mouth at bedtime.    Multiple Vitamin (MULTIVITAMIN WITH MINERALS) TABS Take 1 tablet by mouth daily.   Multiple Vitamins-Minerals (PRESERVISION AREDS 2+MULTI VIT PO) Take 1 tablet by mouth 2 (two) times daily.   NIFEdipine (  PROCARDIA XL/ADALAT-CC) 60 MG 24 hr tablet TAKE 1 TABLET BY MOUTH EVERY DAY TO CONTROL BLOOD PRESSURE   pantoprazole (PROTONIX) 40 MG tablet One daily to reduce stomach acid   Polyethyl Glycol-Propyl Glycol (SYSTANE) 0.4-0.3 % GEL ophthalmic gel Place 1 application into both eyes in the morning, at noon, in the evening, and at bedtime.   Polyethyl Glycol-Propyl Glycol (SYSTANE) 0.4-0.3 % GEL ophthalmic gel Place 1 application into both eyes as needed.   polyethylene glycol (MIRALAX / GLYCOLAX) 17 g packet Take 17 g by mouth daily. Mix 17g  with 8 oz. of fluid daily for constipation   pravastatin (PRAVACHOL) 10 MG tablet Take 10 mg by mouth daily.   Menthol, Topical Analgesic, 4 % GEL Apply 1 application topically as needed. Resident may self administer and keep in room (Patient not taking: Reported on 06/09/2022)   No facility-administered encounter medications on file as of 06/09/2022.    Review of Systems  Constitutional:  Negative for activity change and appetite change.  HENT: Negative.    Respiratory:  Negative for cough and shortness of breath.   Cardiovascular:   Negative for leg swelling.  Gastrointestinal:  Negative for constipation.  Genitourinary: Negative.   Musculoskeletal:  Negative for arthralgias, gait problem and myalgias.  Skin: Negative.   Neurological:  Positive for dizziness. Negative for weakness.  Psychiatric/Behavioral:  Positive for dysphoric mood. Negative for confusion and sleep disturbance. The patient is nervous/anxious.     Immunization History  Administered Date(s) Administered   Fluad Quad(high Dose 65+) 03/09/2022   Influenza Whole 02/14/2012, 02/14/2013   Influenza, High Dose Seasonal PF 03/14/2017, 02/26/2019   Influenza-Unspecified 02/27/2014, 02/12/2015, 02/25/2016, 03/15/2017, 02/19/2018, 02/18/2020, 03/09/2021   Moderna SARS-COV2 Booster Vaccination 10/21/2020   Moderna Sars-Covid-2 Vaccination 05/20/2019, 06/17/2019, 03/30/2020, 03/22/2022   PFIZER(Purple Top)SARS-COV-2 Vaccination 02/03/2021   Pneumococcal Conjugate-13 07/13/2015   Pneumococcal Polysaccharide-23 05/16/1998   Td 05/16/2004   Zoster Recombinat (Shingrix) 02/20/2018, 05/11/2018, 07/16/2018   Zoster, Live 05/16/2005   Pertinent  Health Maintenance Due  Topic Date Due   INFLUENZA VACCINE  Completed   DEXA SCAN  Completed   MAMMOGRAM  Discontinued      04/05/2018   12:08 PM 04/25/2018    2:43 PM 04/09/2019    1:22 PM 06/11/2021   11:40 AM 03/17/2022   11:51 AM  [place] in the past year? 0 0 0 1 0  Was there an injury with Fall?  0  0 0  Fall Risk Category Calculator  0  1 0  Fall Risk Category (Retired)  Low  Low Low  (RETIRED) Patient Fall Risk Level    Moderate fall risk Low fall risk  Patient at Risk for Falls Due to    History of fall(s);Impaired balance/gait;Impaired mobility;Impaired vision History of fall(s);Impaired balance/gait  Fall risk Follow up    Falls evaluation completed;Education provided Falls evaluation completed   Functional Status Survey:    Vitals:   06/09/22 1444  BP: (!) 142/79  Pulse: 88  Resp:  18  Temp: 97.9 F (36.6 C)  TempSrc: Temporal  SpO2: 94%  Weight: 141 lb 6.4 oz (64.1 kg)  Height: '5\' 2"'$  (1.575 m)   Body mass index is 25.86 kg/m. Physical Exam Vitals reviewed.  Constitutional:      Appearance: Normal appearance.  HENT:     Head: Normocephalic.     Nose: Nose normal.     Mouth/Throat:     Mouth: Mucous membranes are moist.  Pharynx: Oropharynx is clear.  Eyes:     Pupils: Pupils are equal, round, and reactive to light.  Cardiovascular:     Rate and Rhythm: Normal rate and regular rhythm.     Pulses: Normal pulses.     Heart sounds: Normal heart sounds. No murmur heard. Pulmonary:     Effort: Pulmonary effort is normal.     Breath sounds: Normal breath sounds.  Abdominal:     General: Abdomen is flat. Bowel sounds are normal.     Palpations: Abdomen is soft.  Musculoskeletal:        General: No swelling.     Cervical back: Neck supple.  Skin:    General: Skin is warm.  Neurological:     General: No focal deficit present.     Mental Status: She is alert and oriented to person, place, and time.  Psychiatric:        Mood and Affect: Mood normal.        Thought Content: Thought content normal.     Labs reviewed: No results for input(s): "NA", "K", "CL", "CO2", "GLUCOSE", "BUN", "CREATININE", "CALCIUM", "MG", "PHOS" in the last 8760 hours. No results for input(s): "AST", "ALT", "ALKPHOS", "BILITOT", "PROT", "ALBUMIN" in the last 8760 hours. No results for input(s): "WBC", "NEUTROABS", "HGB", "HCT", "MCV", "PLT" in the last 8760 hours. Lab Results  Component Value Date   TSH 2.10 03/08/2021   Lab Results  Component Value Date   HGBA1C 5.5 09/28/2018   Lab Results  Component Value Date   CHOL 164 03/08/2021   HDL 56 03/08/2021   LDLCALC 84 03/08/2021   TRIG 139 03/08/2021   CHOLHDL 3.5 08/04/2017    Significant Diagnostic Results in last 30 days:  No results found.  Assessment/Plan 1. Generalized anxiety disorder On Klonopin and  Ativan Also Talks to Psychiatry Nurse Q monthly No GDR possible as she is not able to function   2. Obsessive-compulsive disorder, unspecified type Lexapro  3. Major depression, recurrent, chronic (HCC) Wellbutrin,Lexapro and Wellbutrin Remeron  4. Primary hypertension On Procardia for many Years  5. Hyperlipidemia LDL goal <100 On statin  6. Gastroesophageal reflux disease without esophagitis Protonix  7. Primary osteoarthritis involving multiple joints Mobic PRN Rarely uses it  8. Dizziness Per Neurology No More work up Encourage PO fluids  9. Constipation, unspecified constipation type Production manager Communication:   Labs/tests ordered:  CBC,CMP,TSH Lipid

## 2022-06-12 DIAGNOSIS — R2681 Unsteadiness on feet: Secondary | ICD-10-CM | POA: Diagnosis not present

## 2022-06-12 DIAGNOSIS — F419 Anxiety disorder, unspecified: Secondary | ICD-10-CM | POA: Diagnosis not present

## 2022-06-12 DIAGNOSIS — R41841 Cognitive communication deficit: Secondary | ICD-10-CM | POA: Diagnosis not present

## 2022-06-12 DIAGNOSIS — R293 Abnormal posture: Secondary | ICD-10-CM | POA: Diagnosis not present

## 2022-06-12 DIAGNOSIS — M6281 Muscle weakness (generalized): Secondary | ICD-10-CM | POA: Diagnosis not present

## 2022-06-12 DIAGNOSIS — M545 Low back pain, unspecified: Secondary | ICD-10-CM | POA: Diagnosis not present

## 2022-06-13 DIAGNOSIS — E785 Hyperlipidemia, unspecified: Secondary | ICD-10-CM | POA: Diagnosis not present

## 2022-06-13 DIAGNOSIS — D649 Anemia, unspecified: Secondary | ICD-10-CM | POA: Diagnosis not present

## 2022-06-13 DIAGNOSIS — E039 Hypothyroidism, unspecified: Secondary | ICD-10-CM | POA: Diagnosis not present

## 2022-06-13 LAB — BASIC METABOLIC PANEL
BUN: 21 (ref 4–21)
CO2: 29 — AB (ref 13–22)
Chloride: 101 (ref 99–108)
Creatinine: 0.9 (ref 0.5–1.1)
Glucose: 96
Potassium: 4.6 mEq/L (ref 3.5–5.1)
Sodium: 137 (ref 137–147)

## 2022-06-13 LAB — LIPID PANEL
Cholesterol: 165 (ref 0–200)
HDL: 55 (ref 35–70)
LDL Cholesterol: 89
Triglycerides: 113 (ref 40–160)

## 2022-06-13 LAB — CBC AND DIFFERENTIAL
HCT: 40 (ref 36–46)
Hemoglobin: 13.4 (ref 12.0–16.0)
Platelets: 184 10*3/uL (ref 150–400)
WBC: 6.7

## 2022-06-13 LAB — CBC: RBC: 4.46 (ref 3.87–5.11)

## 2022-06-13 LAB — COMPREHENSIVE METABOLIC PANEL
Albumin: 4.3 (ref 3.5–5.0)
Calcium: 10.4 (ref 8.7–10.7)
Globulin: 2.8

## 2022-06-13 LAB — HEPATIC FUNCTION PANEL
ALT: 10 U/L (ref 7–35)
AST: 16 (ref 13–35)
Alkaline Phosphatase: 70 (ref 25–125)

## 2022-06-14 DIAGNOSIS — R293 Abnormal posture: Secondary | ICD-10-CM | POA: Diagnosis not present

## 2022-06-14 DIAGNOSIS — M545 Low back pain, unspecified: Secondary | ICD-10-CM | POA: Diagnosis not present

## 2022-06-14 DIAGNOSIS — F419 Anxiety disorder, unspecified: Secondary | ICD-10-CM | POA: Diagnosis not present

## 2022-06-14 DIAGNOSIS — R41841 Cognitive communication deficit: Secondary | ICD-10-CM | POA: Diagnosis not present

## 2022-06-14 DIAGNOSIS — R2681 Unsteadiness on feet: Secondary | ICD-10-CM | POA: Diagnosis not present

## 2022-06-14 DIAGNOSIS — M6281 Muscle weakness (generalized): Secondary | ICD-10-CM | POA: Diagnosis not present

## 2022-06-15 DIAGNOSIS — M6281 Muscle weakness (generalized): Secondary | ICD-10-CM | POA: Diagnosis not present

## 2022-06-15 DIAGNOSIS — M545 Low back pain, unspecified: Secondary | ICD-10-CM | POA: Diagnosis not present

## 2022-06-15 DIAGNOSIS — R293 Abnormal posture: Secondary | ICD-10-CM | POA: Diagnosis not present

## 2022-06-15 DIAGNOSIS — F419 Anxiety disorder, unspecified: Secondary | ICD-10-CM | POA: Diagnosis not present

## 2022-06-15 DIAGNOSIS — R2681 Unsteadiness on feet: Secondary | ICD-10-CM | POA: Diagnosis not present

## 2022-06-15 DIAGNOSIS — R41841 Cognitive communication deficit: Secondary | ICD-10-CM | POA: Diagnosis not present

## 2022-06-20 DIAGNOSIS — R293 Abnormal posture: Secondary | ICD-10-CM | POA: Diagnosis not present

## 2022-06-20 DIAGNOSIS — M545 Low back pain, unspecified: Secondary | ICD-10-CM | POA: Diagnosis not present

## 2022-06-20 DIAGNOSIS — R2681 Unsteadiness on feet: Secondary | ICD-10-CM | POA: Diagnosis not present

## 2022-06-20 DIAGNOSIS — M25512 Pain in left shoulder: Secondary | ICD-10-CM | POA: Diagnosis not present

## 2022-06-20 DIAGNOSIS — M6281 Muscle weakness (generalized): Secondary | ICD-10-CM | POA: Diagnosis not present

## 2022-06-22 DIAGNOSIS — M545 Low back pain, unspecified: Secondary | ICD-10-CM | POA: Diagnosis not present

## 2022-06-22 DIAGNOSIS — R293 Abnormal posture: Secondary | ICD-10-CM | POA: Diagnosis not present

## 2022-06-22 DIAGNOSIS — R2681 Unsteadiness on feet: Secondary | ICD-10-CM | POA: Diagnosis not present

## 2022-06-22 DIAGNOSIS — M6281 Muscle weakness (generalized): Secondary | ICD-10-CM | POA: Diagnosis not present

## 2022-06-22 DIAGNOSIS — M25512 Pain in left shoulder: Secondary | ICD-10-CM | POA: Diagnosis not present

## 2022-06-27 DIAGNOSIS — M545 Low back pain, unspecified: Secondary | ICD-10-CM | POA: Diagnosis not present

## 2022-06-27 DIAGNOSIS — M25512 Pain in left shoulder: Secondary | ICD-10-CM | POA: Diagnosis not present

## 2022-06-27 DIAGNOSIS — M6281 Muscle weakness (generalized): Secondary | ICD-10-CM | POA: Diagnosis not present

## 2022-06-27 DIAGNOSIS — R293 Abnormal posture: Secondary | ICD-10-CM | POA: Diagnosis not present

## 2022-06-27 DIAGNOSIS — R2681 Unsteadiness on feet: Secondary | ICD-10-CM | POA: Diagnosis not present

## 2022-06-28 DIAGNOSIS — R293 Abnormal posture: Secondary | ICD-10-CM | POA: Diagnosis not present

## 2022-06-28 DIAGNOSIS — M6281 Muscle weakness (generalized): Secondary | ICD-10-CM | POA: Diagnosis not present

## 2022-06-28 DIAGNOSIS — M545 Low back pain, unspecified: Secondary | ICD-10-CM | POA: Diagnosis not present

## 2022-06-28 DIAGNOSIS — R2681 Unsteadiness on feet: Secondary | ICD-10-CM | POA: Diagnosis not present

## 2022-06-28 DIAGNOSIS — M25512 Pain in left shoulder: Secondary | ICD-10-CM | POA: Diagnosis not present

## 2022-06-29 DIAGNOSIS — M545 Low back pain, unspecified: Secondary | ICD-10-CM | POA: Diagnosis not present

## 2022-06-29 DIAGNOSIS — M25512 Pain in left shoulder: Secondary | ICD-10-CM | POA: Diagnosis not present

## 2022-06-29 DIAGNOSIS — M6281 Muscle weakness (generalized): Secondary | ICD-10-CM | POA: Diagnosis not present

## 2022-06-29 DIAGNOSIS — R2681 Unsteadiness on feet: Secondary | ICD-10-CM | POA: Diagnosis not present

## 2022-06-29 DIAGNOSIS — R293 Abnormal posture: Secondary | ICD-10-CM | POA: Diagnosis not present

## 2022-06-29 DIAGNOSIS — F411 Generalized anxiety disorder: Secondary | ICD-10-CM | POA: Diagnosis not present

## 2022-07-01 DIAGNOSIS — R293 Abnormal posture: Secondary | ICD-10-CM | POA: Diagnosis not present

## 2022-07-01 DIAGNOSIS — M545 Low back pain, unspecified: Secondary | ICD-10-CM | POA: Diagnosis not present

## 2022-07-01 DIAGNOSIS — R2681 Unsteadiness on feet: Secondary | ICD-10-CM | POA: Diagnosis not present

## 2022-07-01 DIAGNOSIS — M6281 Muscle weakness (generalized): Secondary | ICD-10-CM | POA: Diagnosis not present

## 2022-07-01 DIAGNOSIS — M25512 Pain in left shoulder: Secondary | ICD-10-CM | POA: Diagnosis not present

## 2022-07-03 DIAGNOSIS — R293 Abnormal posture: Secondary | ICD-10-CM | POA: Diagnosis not present

## 2022-07-03 DIAGNOSIS — M545 Low back pain, unspecified: Secondary | ICD-10-CM | POA: Diagnosis not present

## 2022-07-03 DIAGNOSIS — R2681 Unsteadiness on feet: Secondary | ICD-10-CM | POA: Diagnosis not present

## 2022-07-03 DIAGNOSIS — M6281 Muscle weakness (generalized): Secondary | ICD-10-CM | POA: Diagnosis not present

## 2022-07-03 DIAGNOSIS — M25512 Pain in left shoulder: Secondary | ICD-10-CM | POA: Diagnosis not present

## 2022-07-05 DIAGNOSIS — M545 Low back pain, unspecified: Secondary | ICD-10-CM | POA: Diagnosis not present

## 2022-07-05 DIAGNOSIS — M6281 Muscle weakness (generalized): Secondary | ICD-10-CM | POA: Diagnosis not present

## 2022-07-05 DIAGNOSIS — M25512 Pain in left shoulder: Secondary | ICD-10-CM | POA: Diagnosis not present

## 2022-07-05 DIAGNOSIS — R2681 Unsteadiness on feet: Secondary | ICD-10-CM | POA: Diagnosis not present

## 2022-07-05 DIAGNOSIS — R293 Abnormal posture: Secondary | ICD-10-CM | POA: Diagnosis not present

## 2022-07-06 DIAGNOSIS — R293 Abnormal posture: Secondary | ICD-10-CM | POA: Diagnosis not present

## 2022-07-06 DIAGNOSIS — M25512 Pain in left shoulder: Secondary | ICD-10-CM | POA: Diagnosis not present

## 2022-07-06 DIAGNOSIS — M6281 Muscle weakness (generalized): Secondary | ICD-10-CM | POA: Diagnosis not present

## 2022-07-06 DIAGNOSIS — M545 Low back pain, unspecified: Secondary | ICD-10-CM | POA: Diagnosis not present

## 2022-07-06 DIAGNOSIS — R2681 Unsteadiness on feet: Secondary | ICD-10-CM | POA: Diagnosis not present

## 2022-07-08 DIAGNOSIS — M25512 Pain in left shoulder: Secondary | ICD-10-CM | POA: Diagnosis not present

## 2022-07-08 DIAGNOSIS — R293 Abnormal posture: Secondary | ICD-10-CM | POA: Diagnosis not present

## 2022-07-08 DIAGNOSIS — R2681 Unsteadiness on feet: Secondary | ICD-10-CM | POA: Diagnosis not present

## 2022-07-08 DIAGNOSIS — M545 Low back pain, unspecified: Secondary | ICD-10-CM | POA: Diagnosis not present

## 2022-07-08 DIAGNOSIS — M6281 Muscle weakness (generalized): Secondary | ICD-10-CM | POA: Diagnosis not present

## 2022-07-11 ENCOUNTER — Encounter: Payer: Medicare Other | Admitting: Orthopedic Surgery

## 2022-07-11 DIAGNOSIS — M545 Low back pain, unspecified: Secondary | ICD-10-CM | POA: Diagnosis not present

## 2022-07-11 DIAGNOSIS — M6281 Muscle weakness (generalized): Secondary | ICD-10-CM | POA: Diagnosis not present

## 2022-07-11 DIAGNOSIS — R2681 Unsteadiness on feet: Secondary | ICD-10-CM | POA: Diagnosis not present

## 2022-07-11 DIAGNOSIS — R293 Abnormal posture: Secondary | ICD-10-CM | POA: Diagnosis not present

## 2022-07-11 DIAGNOSIS — M25512 Pain in left shoulder: Secondary | ICD-10-CM | POA: Diagnosis not present

## 2022-07-12 DIAGNOSIS — M545 Low back pain, unspecified: Secondary | ICD-10-CM | POA: Diagnosis not present

## 2022-07-12 DIAGNOSIS — R293 Abnormal posture: Secondary | ICD-10-CM | POA: Diagnosis not present

## 2022-07-12 DIAGNOSIS — M25512 Pain in left shoulder: Secondary | ICD-10-CM | POA: Diagnosis not present

## 2022-07-12 DIAGNOSIS — R2681 Unsteadiness on feet: Secondary | ICD-10-CM | POA: Diagnosis not present

## 2022-07-12 DIAGNOSIS — M6281 Muscle weakness (generalized): Secondary | ICD-10-CM | POA: Diagnosis not present

## 2022-07-13 ENCOUNTER — Non-Acute Institutional Stay (INDEPENDENT_AMBULATORY_CARE_PROVIDER_SITE_OTHER): Payer: Medicare Other | Admitting: Orthopedic Surgery

## 2022-07-13 ENCOUNTER — Encounter: Payer: Self-pay | Admitting: Orthopedic Surgery

## 2022-07-13 DIAGNOSIS — Z0000 Encounter for general adult medical examination without abnormal findings: Secondary | ICD-10-CM | POA: Diagnosis not present

## 2022-07-13 NOTE — Patient Instructions (Signed)
  Tina Hampton , Thank you for taking time to come for your Medicare Wellness Visit. I appreciate your ongoing commitment to your health goals. Please review the following plan we discussed and let me know if I can assist you in the future.   These are the goals we discussed:  Goals      Anxiety Symptoms Monitored and Managed     Evidence-based guidance:  Discuss adherence to medication therapy; assess and manage barriers, such as costs, side effects, feeling little or no improvement, stigma or low motivation.  Discuss past experiences with psychotropic medication, potential side effects and need to continue medication until treatment becomes effective (e.g., 4 to 8 weeks).  Explain the interplay of stress and physical symptoms, as well as the relationship between illness and emotional problems.  Explore complementary and alternative therapy options, such as applied relaxation, mindfulness, meditation, music therapy, aromatherapy, acupuncture or massage.  Prepare patient for antidepressant pharmacologic therapy which may include additional short-term medications until maintenance medications demonstrate therapeutic effect.   Assess response; monitor and manage side effects.  Prepare patient for use of pharmacologic therapy (e.g., anxiolytic, selective serotonin-reuptake inhibitor, serotonin norepinephrine-reuptake inhibitor, tricyclic antidepressant, antiepileptic, antipsychotic for comorbid depression,   phosphodiesterase-inhibitor for sexual dysfunction, sedative or hypnotic for sleep disturbance); monitor and manage side effects.  Prepare patient for long-term pharmacologic therapy to prevent relapse (e.g., 12 months after symptom improvement).  Promote cognitive behavioral therapy, individualized to severity of symptoms [e.g., nonfacilitated self-help, facilitated (computerized or manual-based) self-help, face-to-face individual treatment].  Encourage patient to develop a self-management plan  that identifies and manages lifestyle triggers (e.g., caffeine, stimulants, nicotine, stress), improves sleep, exercise or physical activity based on tolerance.  Prepare patient for a referral to a psychiatrist when patient has a poor response to treatment, atypical presentation or comorbid psychiatric disorder.  Provide frequent follow-up (e.g., every 2 weeks for the first 6 to 8 weeks of treatment and monthly thereafter).  Explore means to support work reintegration, such as modified working hours, peer support or coaching or a community jobs program.   Notes:         This is a list of the screening recommended for you and due dates:  Health Maintenance  Topic Date Due   COVID-19 Vaccine (6 - 2023-24 season) 01/13/2023*   Medicare Annual Wellness Visit  07/14/2023   Pneumonia Vaccine  Completed   Flu Shot  Completed   DEXA scan (bone density measurement)  Completed   Zoster (Shingles) Vaccine  Completed   HPV Vaccine  Aged Out   DTaP/Tdap/Td vaccine  Discontinued   Mammogram  Discontinued  *Topic was postponed. The date shown is not the original due date.

## 2022-07-13 NOTE — Progress Notes (Signed)
Subjective:   Tina Hampton is a 89 y.o. female who presents for Medicare Annual (Subsequent) preventive examination.  Place of Service: [facility] living unit Provider: Windell Moulding, AGNP-C   Review of Systems     Cardiac Risk Factors include: advanced age (>68mn, >>20women);hypertension;sedentary lifestyle     Objective:    Today's Vitals   07/13/22 0958  BP: 129/73  Pulse: 79  Resp: 10  Temp: 98.1 F (36.7 C)  SpO2: 95%  Weight: 141 lb 3.2 oz (64 kg)  Height: '5\' 2"'$  (1.575 m)   Body mass index is 25.83 kg/m.     07/13/2022   10:17 AM 06/09/2022    3:05 PM 03/17/2022   11:52 AM 12/06/2021    1:35 PM 10/14/2021   11:45 AM 06/11/2021   11:40 AM 05/24/2021    9:37 AM  Advanced Directives  Does Patient Have a Medical Advance Directive? Yes Yes Yes Yes Yes Yes Yes  Type of [place]of [place];Out of facility DNR (pink MOST or yellow form)  Living will Living will Living will [place]Living will [place] will  Does patient want to make changes to medical advance directive? No - Patient declined   No - Patient declined No - Patient declined No - Patient declined No - Patient declined  Copy of [place]in Chart? Yes - validated most recent copy scanned in chart (See row information)     Yes - validated most recent copy scanned in chart (See row information) Yes - validated most recent copy scanned in chart (See row information)    Current Medications (verified) Outpatient Encounter Medications as of 07/13/2022  Medication Sig   Acetylcysteine 600 MG CAPS Take 1 capsule by mouth daily.   antiseptic oral rinse (BIOTENE) LIQD 15 mLs by Mouth Rinse route 3 (three) times daily as needed for dry mouth. Resident may self administer and keep in room   aspirin 81 MG chewable tablet Chew 81 mg by mouth daily.   Bismuth Subsalicylate (PEPTO-BISMOL PO) Take 30 mg by  mouth as needed. For diarrhea   buPROPion (WELLBUTRIN XL) 300 MG 24 hr tablet Take 1 tablet (300 mg total) by mouth daily.   Calcium Carb-Cholecalciferol 600-800 MG-UNIT TABS Take 1 tablet by mouth 2 (two) times daily.   cholecalciferol (VITAMIN D) 1000 UNITS tablet Take 1,000 Units by mouth daily.   clonazePAM (KLONOPIN) 0.5 MG tablet Take 0.5 mg by mouth as needed for anxiety. Take 1/2 tablet up to 4 times daily as needed for anxiety.   docusate sodium (COLACE) 100 MG capsule Take 1 capsule (100 mg total) by mouth daily.   escitalopram (LEXAPRO) 20 MG tablet Take 1 tablet (20 mg total) by mouth daily.   GLUCOSAMINE HCL PO Take 2,000 mg by mouth 2 (two) times daily.   hydrocortisone 2.5 % ointment Apply 1 application topically 2 (two) times daily.   LORazepam (ATIVAN) 0.5 MG tablet Take one tablet three times daily as needed for anxiety and take one extra tablet as needed for severe anxiety.   lubiprostone (AMITIZA) 8 MCG capsule Take 8 mcg by mouth 2 (two) times daily with a meal.   meloxicam (MOBIC) 7.5 MG tablet TAKE 1 TABLET BY MOUTH EVERY DAY AS NEEDED FOR ARTHRITIS   Menthol, Topical Analgesic, 4 % GEL Apply 1 application  topically as needed. Resident may self administer and keep in room   miconazole (MICOTIN) 2 %  powder Apply topically as needed for itching. apply to legs and feet once a day   mirtazapine (REMERON) 30 MG tablet Take 30 mg by mouth at bedtime.    Multiple Vitamin (MULTIVITAMIN WITH MINERALS) TABS Take 1 tablet by mouth daily.   Multiple Vitamins-Minerals (PRESERVISION AREDS 2+MULTI VIT PO) Take 1 tablet by mouth 2 (two) times daily.   NIFEdipine (PROCARDIA XL/ADALAT-CC) 60 MG 24 hr tablet TAKE 1 TABLET BY MOUTH EVERY DAY TO CONTROL BLOOD PRESSURE   pantoprazole (PROTONIX) 40 MG tablet One daily to reduce stomach acid   Polyethyl Glycol-Propyl Glycol (SYSTANE) 0.4-0.3 % GEL ophthalmic gel Place 1 application into both eyes in the morning, at noon, in the evening, and at  bedtime.   Polyethyl Glycol-Propyl Glycol (SYSTANE) 0.4-0.3 % GEL ophthalmic gel Place 1 application into both eyes as needed.   polyethylene glycol (MIRALAX / GLYCOLAX) 17 g packet Take 17 g by mouth daily. Mix 17g  with 8 oz. of fluid daily for constipation   pravastatin (PRAVACHOL) 10 MG tablet Take 10 mg by mouth daily.   [DISCONTINUED] Eyelid Cleansers (OCUSOFT EYELID CLEANSING) PADS Apply 1 application topically daily.   No facility-administered encounter medications on file as of 07/13/2022.    Allergies (verified) Brexpiprazole, Lamictal [lamotrigine], Penicillins, and Abilify [aripiprazole]   History: Past Medical History:  Diagnosis Date   Abnormal mammogram, unspecified 04/19/2011   Allergy    Anxiety    Backache, unspecified 01/21/2005   Cervicalgia 08/20/2013   Chronic kidney disease, stage II (mild) 04/03/2009   Chronic rhinitis 09/28/2010   DDD (degenerative disc disease)    Depression    Dermatophytosis of nail 01/21/2005   Diverticulosis of colon (without mention of hemorrhage) 06/17/2005   Edema 11/26/2004   GERD (gastroesophageal reflux disease)    Hiatal hernia    High cholesterol    Hyperglycemia 11/18/2014   Hypertension    Insomnia, unspecified 10/18/2011   Left inguinal hernia 04/24/2014   Nocturia 09/19/2008   Osteoporosis, senile 11/18/2014   Other abnormal blood chemistry 04/03/2007   Pain in joint, pelvic region and thigh 11/12/2011   Pain in joint, shoulder region 08/15/2006   Pain in limb 08/26/2005   Palpitations 09/29/2009   PAOD (peripheral arterial occlusive disease) ([place]) 11/18/2014   Right leg; diminished popliteal, dorsalis pedis, and posterior tibial artery pulsations    Postmenopausal atrophic vaginitis 12/14/2004   Scoliosis    Senile osteoporosis 12/14/2004   Tension headache 10/04/2005   Unspecified cataract 11/26/2004   Unspecified constipation 01/21/2005   Unspecified urinary incontinence 11/26/2004   Vaginal stenosis 11/18/2014   Tight band about 1/2 inches  into the vagina which will not allow passage of my index finger.    Past Surgical History:  Procedure Laterality Date   ABDOMINAL HYSTERECTOMY  1969   Dr.Braun    bilateral eyelid surgery  2005   Dr.Scott   BILATERAL OOPHORECTOMY  2002   BREAST SURGERY     benign tumor removal, left breast 1969   CATARACT EXTRACTION     Right   CHOLECYSTECTOMY  1990   Dr.Moore    COLONOSCOPY  3/18/20008   inflammation, diverticular associated colitis -Dr. Ardis Hughs   COSMETIC SURGERY     ESOPHAGOGASTRODUODENOSCOPY N/A 01/24/2014   Procedure: ESOPHAGOGASTRODUODENOSCOPY (EGD);  Surgeon: Irene Shipper, MD;  Location[facility]COPY;  Service: Endoscopy;  Laterality: N/A;   ESOPHAGOGASTRODUODENOSCOPY ENDOSCOPY  04/04/14   Dr. Ardis Hughs   EYE SURGERY     FOREIGN BODY REMOVAL N/A 01/24/2014  Procedure: FOREIGN BODY REMOVAL;  Surgeon: Irene Shipper, MD;  Location: [facility] ENDOSCOPY;  Service: Endoscopy;  Laterality: N/A;   HIP PINNING,CANNULATED  11/07/2011   Procedure: CANNULATED HIP PINNING;  Surgeon: Jessy Oto, MD;  Location: [facility] ORS;  Service: Orthopedics;  Laterality: Left;   ORIF FEMORAL NECK FRACTURE W/ [facility]  11/07/2011   Dr.Nitka    TONSILLECTOMY     Dr.Joe Tamala Julian   TUBAL LIGATION     VAGINAL PROLAPSE REPAIR  2002   Dr.Horback    Family History  Problem Relation Age of Onset   Heart disease Mother    Stroke Mother    Emphysema Father    Arthritis Father    Alcohol abuse Brother    Heart disease Son    Leukemia Son    Esophageal cancer Neg Hx    Stomach cancer Neg Hx    Colon cancer Neg Hx    Social History   Socioeconomic History   Marital status: Widowed    Spouse name: Not on file   Number of children: Not on file   Years of education: Not on file   Highest education level: Not on file  Occupational History   Occupation: retired Nurse, 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  Tobacco Use   Smoking status: Never   Smokeless tobacco: Never  Vaping Use   Vaping Use: Never used  Substance and Sexual  Activity   Alcohol use: Not Currently    Alcohol/week: 0.0 standard drinks of alcohol    Comment: one glass of wine in evening   Drug use: No   Sexual activity: Never  Other Topics Concern   Not on file  Social History Narrative   Lives at [facility] since 04/14/2005   Widowed (husband expired 2014)   Has Living Will, POA, MOST   Exercise: water aerobic  5 times a week   Walks with walker   Never smoked   Drinks moderate amount of caffeinate beverages daily   Alcohol none      Social Determinants of Health   Financial Resource Strain: Low Risk  (07/13/2022)   Overall Financial Resource Strain (CARDIA)    Difficulty of Paying Living Expenses: Not hard at all  Food Insecurity: No Food Insecurity (07/13/2022)   Hunger Vital Sign    Worried About Running Out of Food in the Last Year: Never true    [place] in the Last Year: Never true  Transportation Needs: No Transportation Needs (07/13/2022)   PRAPARE - Hydrologist (Medical): No    Lack of Transportation (Non-Medical): No  Physical Activity: Insufficiently Active (07/13/2022)   Exercise Vital Sign    Days of Exercise per Week: 3 days    Minutes of Exercise per Session: 10 min  Stress: Stress Concern Present (07/13/2022)   [place]    Feeling of Stress : Rather much  Social Connections: Unknown (07/13/2022)   Social Connection and Isolation Panel [NHANES]    Frequency of Communication with Friends and Family: Three times a week    Frequency of Social Gatherings with Friends and Family: Three times a week    Attends Religious Services: Never    Active Member of Clubs or Organizations: Not on file    Attends Archivist Meetings: Not on file    Marital Status: Widowed    Tobacco Counseling Counseling given: Not Answered   Clinical Intake:  Pre-visit preparation completed: No  Pain : No/denies pain      BMI - recorded: 25.83 Nutritional Status: BMI 25 -29 Overweight Nutritional Risks: None Diabetes: No  How often do you need to have someone help you when you read instructions, pamphlets, or other written materials from your doctor or pharmacy?: 3 - Sometimes What is the last grade level you completed in school?: college  Diabetic?No  Interpreter Needed?: No      Activities of Daily Living    07/13/2022   11:31 AM  In your present state of health, do you have any difficulty performing the following activities:  Hearing? 1  Vision? 0  Difficulty concentrating or making decisions? 0  Walking or climbing stairs? 1  Dressing or bathing? 1  Doing errands, shopping? 1  Preparing Food and eating ? Y  Using the Toilet? N  In the past six months, have you accidently leaked urine? Y  Do you have problems with loss of bowel control? N  Managing your Medications? Y  Managing your Finances? Y  Housekeeping or managing your Housekeeping? Y    Patient Care Team: Virgie Dad, MD as PCP - General (Internal Medicine) Melina Modena, [facility] Cl[facility] Jacks, MD as Consulting Physician (Ophthalmology) Milus Banister, MD as Consulting Physician (Gastroenterology) Norma Fredrickson, MD as Consulting Physician (Psychiatry) Jessy Oto, MD (Inactive) as Consulting Physician (Orthopedic Surgery) Bjorn Loser, MD as Consulting Physician (Urology)  Indicate any recent Medical Services you may have received from other than [facility] providers in the past year (date may be approximate).     Assessment:   This is a routine wellness examination for [place].  Hearing/Vision screen No results found.  Dietary issues and exercise activities discussed: Current Exercise Habits: The patient does not participate in regular exercise at present, Exercise limited by: cardiac condition(s);orthopedic condition(s);psychological condition(s)   Goals Addressed             This Visit's Progress     Anxiety Symptoms Monitored and Managed   On track    Evidence-based guidance:  Discuss adherence to medication therapy; assess and manage barriers, such as costs, side effects, feeling little or no improvement, stigma or low motivation.  Discuss past experiences with psychotropic medication, potential side effects and need to continue medication until treatment becomes effective (e.g., 4 to 8 weeks).  Explain the interplay of stress and physical symptoms, as well as the relationship between illness and emotional problems.  Explore complementary and alternative therapy options, such as applied relaxation, mindfulness, meditation, music therapy, aromatherapy, acupuncture or massage.  Prepare patient for antidepressant pharmacologic therapy which may include additional short-term medications until maintenance medications demonstrate therapeutic effect.   Assess response; monitor and manage side effects.  Prepare patient for use of pharmacologic therapy (e.g., anxiolytic, selective serotonin-reuptake inhibitor, serotonin norepinephrine-reuptake inhibitor, tricyclic antidepressant, antiepileptic, antipsychotic for comorbid depression,   phosphodiesterase-inhibitor for sexual dysfunction, sedative or hypnotic for sleep disturbance); monitor and manage side effects.  Prepare patient for long-term pharmacologic therapy to prevent relapse (e.g., 12 months after symptom improvement).  Promote cognitive behavioral therapy, individualized to severity of symptoms [e.g., nonfacilitated self-help, facilitated (computerized or manual-based) self-help, face-to-face individual treatment].  Encourage patient to develop a self-management plan that identifies and manages lifestyle triggers (e.g., caffeine, stimulants, nicotine, stress), improves sleep, exercise or physical activity based on tolerance.  Prepare patient for a referral to a psychiatrist when patient has a poor response to treatment, atypical presentation or  comorbid psychiatric disorder.  Provide frequent  follow-up (e.g., every 2 weeks for the first 6 to 8 weeks of treatment and monthly thereafter).  Explore means to support work reintegration, such as modified working hours, peer support or coaching or a community jobs program.   Notes:        Depression Screen    07/13/2022   11:30 AM 06/11/2021   11:36 AM 04/25/2018    2:43 PM 04/21/2017    3:08 PM 05/31/2016   10:15 AM 10/20/2015   10:20 AM 09/01/2015    9:40 AM  PHQ 2/9 Scores  PHQ - 2 Score '3 6 6 6 '$ 0 1 0  PHQ- 9 Score '9 9 10 15       '$ Fall Risk    07/13/2022   11:31 AM 03/17/2022   11:51 AM 06/11/2021   11:40 AM 04/09/2019    1:22 PM 04/25/2018    2:43 PM  [place] in the past year? 0 0 1 0 0  Comment    Emmi Telephone Survey: data to providers prior to load   Number falls in past yr: 0 0 0  0  Injury with Fall? 0 0 0  0  Risk for fall due to : History of fall(s);Impaired balance/gait;Impaired mobility History of fall(s);Impaired balance/gait History of fall(s);Impaired balance/gait;Impaired mobility;Impaired vision    Follow up Falls evaluation completed;Education provided;Falls prevention discussed Falls evaluation completed Falls evaluation completed;Education provided      FALL RISK PREVENTION PERTAINING TO THE HOME:  Any stairs in or around the home? No  If so, are there any without handrails? No  Home free of loose throw rugs in walkways, pet beds, electrical cords, etc? Yes  Adequate lighting in your home to reduce risk of falls? Yes   ASSISTIVE DEVICES UTILIZED TO PREVENT FALLS:  Life alert? No  Use of a cane, walker or w/c? Yes  Grab bars in the bathroom? Yes  Shower chair or bench in shower? Yes  Elevated toilet seat or a handicapped toilet? Yes   TIMED UP AND GO:  Was the test performed? No .  Length of time to ambulate 10 feet: N/A sec.   Gait slow and steady with assistive device  Cognitive Function:    07/13/2022   11:33 AM 06/11/2021    11:46 AM 09/20/2017   10:04 AM 05/31/2016   10:42 AM 11/18/2014   10:25 AM  MMSE - Mini Mental State Exam  Not completed:  Refused     Orientation to time '5  5 5 5  '$ Orientation to Place '5  5 5 5  '$ Registration '3  3 3 3  '$ Attention/ Calculation '5  5 5 5  '$ Recall '1  3 3 3  '$ Language- name 2 objects '2  2 2 2  '$ Language- repeat '1  1 1 1  '$ Language- follow 3 step command '3  3 3 3  '$ Language- read & follow direction '1  1 1 1  '$ Write a sentence '1  1 1 1  '$ Copy design '1   1 1  '$ Total score '28   30 30        '$ 06/11/2021   11:46 AM  6CIT Screen  What Year? 0 points  What month? 0 points  What time? 3 points  Count back from 20 0 points  Months in reverse 2 points  Repeat phrase 0 points  Total Score 5 points    Immunizations Immunization History  Administered Date(s) Administered   Fluad Quad(high Dose 65+) 03/09/2022  Influenza Whole 02/14/2012, 02/14/2013   Influenza, High Dose Seasonal PF 03/14/2017, 02/26/2019   Influenza-Unspecified 02/27/2014, 02/12/2015, 02/25/2016, 03/15/2017, 02/19/2018, 02/18/2020, 03/09/2021   Moderna SARS-COV2 Booster Vaccination 10/21/2020   Moderna Sars-Covid-2 Vaccination 05/20/2019, 06/17/2019, 03/30/2020, 03/22/2022   PFIZER(Purple Top)SARS-COV-2 Vaccination 02/03/2021   Pneumococcal Conjugate-13 07/13/2015   Pneumococcal Polysaccharide-23 05/16/1998   Td 05/16/2004   Zoster Recombinat (Shingrix) 02/20/2018, 05/11/2018, 07/16/2018   Zoster, Live 05/16/2005    TDAP status: Due, Education has been provided regarding the importance of this vaccine. Advised may receive this vaccine at local pharmacy or Health Dept. Aware to provide a copy of the vaccination record if obtained from local pharmacy or Health Dept. Verbalized acceptance and understanding.vRefused by patient  Flu Vaccine status: Up to date  Pneumococcal vaccine status: Up to date  Covid-19 vaccine status: Completed vaccines  Qualifies for Shingles Vaccine? Yes   Zostavax completed Yes    Shingrix Completed?: Yes  Screening Tests Health Maintenance  Topic Date Due   COVID-19 Vaccine (6 - 2023-24 season) 05/17/2022   Medicare Annual Wellness (AWV)  07/14/2023   Pneumonia Vaccine 60+ Years old  Completed   INFLUENZA VACCINE  Completed   DEXA SCAN  Completed   Zoster Vaccines- Shingrix  Completed   HPV VACCINES  Aged Out   DTaP/Tdap/Td  Discontinued   MAMMOGRAM  Discontinued    Health Maintenance  Health Maintenance Due  Topic Date Due   COVID-19 Vaccine (6 - 2023-24 season) 05/17/2022    Colorectal cancer screening: No longer required.   Mammogram status: No longer required due to advanced age.  Bone Density status: Completed 2018. Results reflect: Bone density results: OSTEOPOROSIS. Repeat every 2 years.Does not want to do anymore.   Lung Cancer Screening: (Low Dose CT Chest recommended if Age 25-80 years, 30 pack-year currently smoking OR have quit w/in 15years.) does not qualify.   Lung Cancer Screening Referral: No  Additional Screening:  Hepatitis C Screening: does not qualify; Completed   Vision Screening: Recommended annual ophthalmology exams for early detection of glaucoma and other disorders of the eye. Is the patient up to date with their annual eye exam?  No  Who is the provider or what is the name of the office in which the patient attends annual eye exams? Cannot recall If pt is not established with a provider, would they like to be referred to a provider to establish care? No .   Dental Screening: Recommended annual dental exams for proper oral hygiene  Community Resource Referral / Chronic Care Management: CRR required this visit?  No   CCM required this visit?  No      Plan:     I have personally reviewed and noted the following in the patient's chart:   Medical and social history Use of alcohol, tobacco or illicit drugs  Current medications and supplements including opioid prescriptions. Patient is not currently taking opioid  prescriptions. Functional ability and status Nutritional status Physical activity Advanced directives List of other physicians Hospitalizations, surgeries, and ER visits in previous 12 months Vitals Screenings to include cognitive, depression, and falls Referrals and appointments  In addition, I have reviewed and discussed with patient certain preventive protocols, quality metrics, and best practice recommendations. A written personalized care plan for preventive services as well as general preventive health recommendations were provided to patient.     Yvonna Alanis, NP   07/13/2022   Nurse Notes: recommend yearly eye exam with in-house provider

## 2022-07-15 DIAGNOSIS — R2681 Unsteadiness on feet: Secondary | ICD-10-CM | POA: Diagnosis not present

## 2022-07-15 DIAGNOSIS — M6281 Muscle weakness (generalized): Secondary | ICD-10-CM | POA: Diagnosis not present

## 2022-07-15 DIAGNOSIS — M545 Low back pain, unspecified: Secondary | ICD-10-CM | POA: Diagnosis not present

## 2022-07-15 DIAGNOSIS — R293 Abnormal posture: Secondary | ICD-10-CM | POA: Diagnosis not present

## 2022-07-15 DIAGNOSIS — M25512 Pain in left shoulder: Secondary | ICD-10-CM | POA: Diagnosis not present

## 2022-07-17 DIAGNOSIS — M545 Low back pain, unspecified: Secondary | ICD-10-CM | POA: Diagnosis not present

## 2022-07-17 DIAGNOSIS — R2681 Unsteadiness on feet: Secondary | ICD-10-CM | POA: Diagnosis not present

## 2022-07-17 DIAGNOSIS — M25512 Pain in left shoulder: Secondary | ICD-10-CM | POA: Diagnosis not present

## 2022-07-17 DIAGNOSIS — R293 Abnormal posture: Secondary | ICD-10-CM | POA: Diagnosis not present

## 2022-07-17 DIAGNOSIS — M6281 Muscle weakness (generalized): Secondary | ICD-10-CM | POA: Diagnosis not present

## 2022-07-19 DIAGNOSIS — R293 Abnormal posture: Secondary | ICD-10-CM | POA: Diagnosis not present

## 2022-07-19 DIAGNOSIS — M545 Low back pain, unspecified: Secondary | ICD-10-CM | POA: Diagnosis not present

## 2022-07-19 DIAGNOSIS — M25512 Pain in left shoulder: Secondary | ICD-10-CM | POA: Diagnosis not present

## 2022-07-19 DIAGNOSIS — M6281 Muscle weakness (generalized): Secondary | ICD-10-CM | POA: Diagnosis not present

## 2022-07-19 DIAGNOSIS — R2681 Unsteadiness on feet: Secondary | ICD-10-CM | POA: Diagnosis not present

## 2022-07-22 DIAGNOSIS — M6281 Muscle weakness (generalized): Secondary | ICD-10-CM | POA: Diagnosis not present

## 2022-07-22 DIAGNOSIS — R2681 Unsteadiness on feet: Secondary | ICD-10-CM | POA: Diagnosis not present

## 2022-07-22 DIAGNOSIS — R293 Abnormal posture: Secondary | ICD-10-CM | POA: Diagnosis not present

## 2022-07-22 DIAGNOSIS — M25512 Pain in left shoulder: Secondary | ICD-10-CM | POA: Diagnosis not present

## 2022-07-22 DIAGNOSIS — M545 Low back pain, unspecified: Secondary | ICD-10-CM | POA: Diagnosis not present

## 2022-07-24 DIAGNOSIS — R2681 Unsteadiness on feet: Secondary | ICD-10-CM | POA: Diagnosis not present

## 2022-07-24 DIAGNOSIS — M6281 Muscle weakness (generalized): Secondary | ICD-10-CM | POA: Diagnosis not present

## 2022-07-24 DIAGNOSIS — R293 Abnormal posture: Secondary | ICD-10-CM | POA: Diagnosis not present

## 2022-07-24 DIAGNOSIS — M545 Low back pain, unspecified: Secondary | ICD-10-CM | POA: Diagnosis not present

## 2022-07-24 DIAGNOSIS — M25512 Pain in left shoulder: Secondary | ICD-10-CM | POA: Diagnosis not present

## 2022-07-25 DIAGNOSIS — M6281 Muscle weakness (generalized): Secondary | ICD-10-CM | POA: Diagnosis not present

## 2022-07-25 DIAGNOSIS — M25512 Pain in left shoulder: Secondary | ICD-10-CM | POA: Diagnosis not present

## 2022-07-25 DIAGNOSIS — R2681 Unsteadiness on feet: Secondary | ICD-10-CM | POA: Diagnosis not present

## 2022-07-25 DIAGNOSIS — M545 Low back pain, unspecified: Secondary | ICD-10-CM | POA: Diagnosis not present

## 2022-07-25 DIAGNOSIS — R293 Abnormal posture: Secondary | ICD-10-CM | POA: Diagnosis not present

## 2022-07-27 DIAGNOSIS — R293 Abnormal posture: Secondary | ICD-10-CM | POA: Diagnosis not present

## 2022-07-27 DIAGNOSIS — M25512 Pain in left shoulder: Secondary | ICD-10-CM | POA: Diagnosis not present

## 2022-07-27 DIAGNOSIS — M545 Low back pain, unspecified: Secondary | ICD-10-CM | POA: Diagnosis not present

## 2022-07-27 DIAGNOSIS — R2681 Unsteadiness on feet: Secondary | ICD-10-CM | POA: Diagnosis not present

## 2022-07-27 DIAGNOSIS — M6281 Muscle weakness (generalized): Secondary | ICD-10-CM | POA: Diagnosis not present

## 2022-07-27 DIAGNOSIS — F411 Generalized anxiety disorder: Secondary | ICD-10-CM | POA: Diagnosis not present

## 2022-08-01 DIAGNOSIS — M545 Low back pain, unspecified: Secondary | ICD-10-CM | POA: Diagnosis not present

## 2022-08-01 DIAGNOSIS — R293 Abnormal posture: Secondary | ICD-10-CM | POA: Diagnosis not present

## 2022-08-01 DIAGNOSIS — M6281 Muscle weakness (generalized): Secondary | ICD-10-CM | POA: Diagnosis not present

## 2022-08-01 DIAGNOSIS — R2681 Unsteadiness on feet: Secondary | ICD-10-CM | POA: Diagnosis not present

## 2022-08-01 DIAGNOSIS — M25512 Pain in left shoulder: Secondary | ICD-10-CM | POA: Diagnosis not present

## 2022-08-02 DIAGNOSIS — M6281 Muscle weakness (generalized): Secondary | ICD-10-CM | POA: Diagnosis not present

## 2022-08-02 DIAGNOSIS — M545 Low back pain, unspecified: Secondary | ICD-10-CM | POA: Diagnosis not present

## 2022-08-02 DIAGNOSIS — R2681 Unsteadiness on feet: Secondary | ICD-10-CM | POA: Diagnosis not present

## 2022-08-02 DIAGNOSIS — R293 Abnormal posture: Secondary | ICD-10-CM | POA: Diagnosis not present

## 2022-08-02 DIAGNOSIS — M25512 Pain in left shoulder: Secondary | ICD-10-CM | POA: Diagnosis not present

## 2022-08-03 DIAGNOSIS — M6281 Muscle weakness (generalized): Secondary | ICD-10-CM | POA: Diagnosis not present

## 2022-08-03 DIAGNOSIS — M545 Low back pain, unspecified: Secondary | ICD-10-CM | POA: Diagnosis not present

## 2022-08-03 DIAGNOSIS — M25512 Pain in left shoulder: Secondary | ICD-10-CM | POA: Diagnosis not present

## 2022-08-03 DIAGNOSIS — R293 Abnormal posture: Secondary | ICD-10-CM | POA: Diagnosis not present

## 2022-08-03 DIAGNOSIS — R2681 Unsteadiness on feet: Secondary | ICD-10-CM | POA: Diagnosis not present

## 2022-08-04 DIAGNOSIS — R293 Abnormal posture: Secondary | ICD-10-CM | POA: Diagnosis not present

## 2022-08-04 DIAGNOSIS — R2681 Unsteadiness on feet: Secondary | ICD-10-CM | POA: Diagnosis not present

## 2022-08-04 DIAGNOSIS — M6281 Muscle weakness (generalized): Secondary | ICD-10-CM | POA: Diagnosis not present

## 2022-08-04 DIAGNOSIS — M545 Low back pain, unspecified: Secondary | ICD-10-CM | POA: Diagnosis not present

## 2022-08-04 DIAGNOSIS — M25512 Pain in left shoulder: Secondary | ICD-10-CM | POA: Diagnosis not present

## 2022-08-05 DIAGNOSIS — R2681 Unsteadiness on feet: Secondary | ICD-10-CM | POA: Diagnosis not present

## 2022-08-05 DIAGNOSIS — M6281 Muscle weakness (generalized): Secondary | ICD-10-CM | POA: Diagnosis not present

## 2022-08-05 DIAGNOSIS — M545 Low back pain, unspecified: Secondary | ICD-10-CM | POA: Diagnosis not present

## 2022-08-05 DIAGNOSIS — R293 Abnormal posture: Secondary | ICD-10-CM | POA: Diagnosis not present

## 2022-08-05 DIAGNOSIS — M25512 Pain in left shoulder: Secondary | ICD-10-CM | POA: Diagnosis not present

## 2022-08-08 DIAGNOSIS — M6281 Muscle weakness (generalized): Secondary | ICD-10-CM | POA: Diagnosis not present

## 2022-08-08 DIAGNOSIS — M545 Low back pain, unspecified: Secondary | ICD-10-CM | POA: Diagnosis not present

## 2022-08-08 DIAGNOSIS — R2681 Unsteadiness on feet: Secondary | ICD-10-CM | POA: Diagnosis not present

## 2022-08-08 DIAGNOSIS — M25512 Pain in left shoulder: Secondary | ICD-10-CM | POA: Diagnosis not present

## 2022-08-08 DIAGNOSIS — R293 Abnormal posture: Secondary | ICD-10-CM | POA: Diagnosis not present

## 2022-08-09 DIAGNOSIS — M6281 Muscle weakness (generalized): Secondary | ICD-10-CM | POA: Diagnosis not present

## 2022-08-09 DIAGNOSIS — M545 Low back pain, unspecified: Secondary | ICD-10-CM | POA: Diagnosis not present

## 2022-08-09 DIAGNOSIS — R293 Abnormal posture: Secondary | ICD-10-CM | POA: Diagnosis not present

## 2022-08-09 DIAGNOSIS — M25512 Pain in left shoulder: Secondary | ICD-10-CM | POA: Diagnosis not present

## 2022-08-09 DIAGNOSIS — R2681 Unsteadiness on feet: Secondary | ICD-10-CM | POA: Diagnosis not present

## 2022-08-15 ENCOUNTER — Encounter: Payer: Self-pay | Admitting: Orthopedic Surgery

## 2022-08-15 ENCOUNTER — Non-Acute Institutional Stay: Payer: Medicare Other | Admitting: Orthopedic Surgery

## 2022-08-15 DIAGNOSIS — M6281 Muscle weakness (generalized): Secondary | ICD-10-CM | POA: Diagnosis not present

## 2022-08-15 DIAGNOSIS — I10 Essential (primary) hypertension: Secondary | ICD-10-CM | POA: Diagnosis not present

## 2022-08-15 DIAGNOSIS — F411 Generalized anxiety disorder: Secondary | ICD-10-CM | POA: Diagnosis not present

## 2022-08-15 DIAGNOSIS — E785 Hyperlipidemia, unspecified: Secondary | ICD-10-CM

## 2022-08-15 DIAGNOSIS — M25512 Pain in left shoulder: Secondary | ICD-10-CM | POA: Diagnosis not present

## 2022-08-15 DIAGNOSIS — R2681 Unsteadiness on feet: Secondary | ICD-10-CM | POA: Diagnosis not present

## 2022-08-15 DIAGNOSIS — R195 Other fecal abnormalities: Secondary | ICD-10-CM | POA: Diagnosis not present

## 2022-08-15 DIAGNOSIS — M810 Age-related osteoporosis without current pathological fracture: Secondary | ICD-10-CM

## 2022-08-15 DIAGNOSIS — F339 Major depressive disorder, recurrent, unspecified: Secondary | ICD-10-CM | POA: Diagnosis not present

## 2022-08-15 DIAGNOSIS — M159 Polyosteoarthritis, unspecified: Secondary | ICD-10-CM

## 2022-08-15 DIAGNOSIS — K5900 Constipation, unspecified: Secondary | ICD-10-CM

## 2022-08-15 DIAGNOSIS — F429 Obsessive-compulsive disorder, unspecified: Secondary | ICD-10-CM | POA: Diagnosis not present

## 2022-08-15 DIAGNOSIS — K219 Gastro-esophageal reflux disease without esophagitis: Secondary | ICD-10-CM | POA: Diagnosis not present

## 2022-08-15 DIAGNOSIS — R293 Abnormal posture: Secondary | ICD-10-CM | POA: Diagnosis not present

## 2022-08-15 DIAGNOSIS — M545 Low back pain, unspecified: Secondary | ICD-10-CM | POA: Diagnosis not present

## 2022-08-15 DIAGNOSIS — M150 Primary generalized (osteo)arthritis: Secondary | ICD-10-CM

## 2022-08-15 NOTE — Progress Notes (Signed)
Location:   [place] Room Number: [place] of Service:  ALF [phone]) Provider:  Windell Moulding, NP  PCP: Virgie Dad, MD  Patient Care Team: Virgie Dad, MD as PCP - General (Internal Medicine) Melina Modena, [facility] Clent Jacks, MD as Consulting Physician (Ophthalmology) Milus Banister, MD as Consulting Physician (Gastroenterology) Norma Fredrickson, MD as Consulting Physician (Psychiatry) Jessy Oto, MD (Inactive) as Consulting Physician (Orthopedic Surgery) Bjorn Loser, MD as Consulting Physician (Urology)  Extended Emergency Contact Information Primary Emergency Contact: [facility] Phone: [phone] Relation: Son Secondary Emergency Contact: [place], [place] [address] Johnnette Litter of [place] Phone: [phone] Mobile Phone: [phone] Relation: Son  Code Status:  DNR Goals of care: Advanced Directive information    08/15/2022   10:54 AM  Advanced Directives  Does Patient Have a Medical Advance Directive? Yes  Type of Paramedic of [place];Living will;Out of facility DNR (pink MOST or yellow form)  Does patient want to make changes to medical advance directive? No - Patient declined  Copy of [place] in Chart? Yes - validated most recent copy scanned in chart (See row information)     Chief Complaint  Patient presents with   Medical Management of Chronic Issues    Routine Visit.    HPI:  Pt is a 89 y.o. female seen today for medical management of chronic diseases.    She currently resides on the assisted living unit at [facility]. PMH: HTN, HLD, GAD, major depression, left breast surgery 1969, oophorectomy 2002, cholecystectomy, chronic back pain, spinal stenosis, OA, osteoporosis.    Dark stools- reports increased dark stools x 3 weeks, denies abdominal pain, she is on Protonix daily, hgb 13.4 06/13/2022 Depression/OCD/GAD- followed by  psych- no further recommendations at this time, remains on klonopin, lorazepam, Remeron, Wellbutrin and Lexapro HTN- BUN/creat 21/0.9 06/13/2022, remains on Procardia HLD- LDL 89 06/13/2022, remain on pravastatin GERD- hgb 13.4 06/13/2022, remains on Protonix OA- currently working with PT, ambulates with walker, no recent falls, remains on biofreeze/tylenol/meloxicam Osteoporosis- DEXA 2018, t score -2.9, remains on calcium and vitamin D Constipation- remains on colace and Amitizia  Recent blood pressures:  03/27- 141/82  03/20- 150/90  03/13- 116/68  Recent weights:  03/07- 139.2 lbs  02/01- 139.4 lbs  01/03- 141.4 lbs   Past Medical History:  Diagnosis Date   Abnormal mammogram, unspecified 04/19/2011   Allergy    Anxiety    Backache, unspecified 01/21/2005   Cervicalgia 08/20/2013   Chronic kidney disease, stage II (mild) 04/03/2009   Chronic rhinitis 09/28/2010   DDD (degenerative disc disease)    Depression    Dermatophytosis of nail 01/21/2005   Diverticulosis of colon (without mention of hemorrhage) 06/17/2005   Edema 11/26/2004   GERD (gastroesophageal reflux disease)    Hiatal hernia    High cholesterol    Hyperglycemia 11/18/2014   Hypertension    Insomnia, unspecified 10/18/2011   Left inguinal hernia 04/24/2014   Nocturia 09/19/2008   Osteoporosis, senile 11/18/2014   Other abnormal blood chemistry 04/03/2007   Pain in joint, pelvic region and thigh 11/12/2011   Pain in joint, shoulder region 08/15/2006   Pain in limb 08/26/2005   Palpitations 09/29/2009   PAOD (peripheral arterial occlusive disease) 11/18/2014   Right leg; diminished popliteal, dorsalis pedis, and posterior tibial artery pulsations    Postmenopausal atrophic vaginitis 12/14/2004   Scoliosis  Senile osteoporosis 12/14/2004   Tension headache 10/04/2005   Unspecified cataract 11/26/2004   Unspecified constipation 01/21/2005   Unspecified urinary incontinence 11/26/2004   Vaginal stenosis 11/18/2014   Tight band about  1/2 inches into the vagina which will not allow passage of my index finger.    Past Surgical History:  Procedure Laterality Date   ABDOMINAL HYSTERECTOMY  1969   Dr.Braun    bilateral eyelid surgery  2005   Dr.Scott   BILATERAL OOPHORECTOMY  2002   BREAST SURGERY     benign tumor removal, left breast 1969   CATARACT EXTRACTION     Right   CHOLECYSTECTOMY  1990   Dr.Moore    COLONOSCOPY  3/18/20008   inflammation, diverticular associated colitis -Dr. Ardis Hughs   COSMETIC SURGERY     ESOPHAGOGASTRODUODENOSCOPY N/A 01/24/2014   Procedure: ESOPHAGOGASTRODUODENOSCOPY (EGD);  Surgeon: Irene Shipper, MD;  Location: [facility] ENDOSCOPY;  Service: Endoscopy;  Laterality: N/A;   ESOPHAGOGASTRODUODENOSCOPY ENDOSCOPY  04/04/14   Dr. Ardis Hughs   EYE SURGERY     FOREIGN BODY REMOVAL N/A 01/24/2014   Procedure: FOREIGN BODY REMOVAL;  Surgeon: Irene Shipper, MD;  Location: [place];  Service: Endoscopy;  Laterality: N/A;   HIP PINNING,CANNULATED  11/07/2011   Procedure: CANNULATED HIP PINNING;  Surgeon: Jessy Oto, MD;  Location: [facility];  Service: Orthopedics;  Laterality: Left;   ORIF FEMORAL NECK FRACTURE W/ DHS  11/07/2011   Dr.Nitka    TONSILLECTOMY     Dr.Joe Tamala Julian   TUBAL LIGATION     VAGINAL PROLAPSE REPAIR  2002   Dr.Horback     Allergies  Allergen Reactions   Brexpiprazole Swelling   Lamictal [Lamotrigine] Swelling    Lip swelling   Penicillins Other (See Comments)    CHILDHOOD REACTION   Abilify [Aripiprazole] Rash    Allergies as of 08/15/2022       Reactions   Brexpiprazole Swelling   Lamictal [lamotrigine] Swelling   Lip swelling   Penicillins Other (See Comments)   CHILDHOOD REACTION   Abilify [aripiprazole] Rash        Medication List        Accurate as of August 15, 2022 10:54 AM. If you have any questions, ask your nurse or doctor.          STOP taking these medications    Systane 0.4-0.3 % Gel ophthalmic gel Generic drug: Polyethyl Glycol-Propyl  Glycol Stopped by: Yvonna Alanis, NP       TAKE these medications    Acetylcysteine 600 MG Caps Take 1 capsule by mouth daily.   antiseptic oral rinse Liqd 15 mLs by Mouth Rinse route as needed for dry mouth. Resident may self administer and keep in room   aspirin 81 MG chewable tablet Chew 81 mg by mouth daily.   buPROPion 300 MG 24 hr tablet Commonly known as: WELLBUTRIN XL Take 1 tablet (300 mg total) by mouth daily.   Calcium Carb-Cholecalciferol 600-800 MG-UNIT Tabs Take 1 tablet by mouth 2 (two) times daily.   cholecalciferol 1000 units tablet Commonly known as: VITAMIN D Take 1,000 Units by mouth daily.   clonazePAM 0.5 MG tablet Commonly known as: KLONOPIN Take 0.5 mg by mouth as needed for anxiety. Take 1/2 tablet up to 4 times daily as needed for anxiety.   docusate sodium 100 MG capsule Commonly known as: Colace Take 1 capsule (100 mg total) by mouth daily.   escitalopram 20 MG tablet Commonly known as: LEXAPRO Take 1  tablet (20 mg total) by mouth daily.   GenTeal Severe 0.3 % Gel ophthalmic ointment Generic drug: hypromellose Place 1 Application into both eyes as needed for dry eyes.   GenTeal Severe 0.3 % Gel ophthalmic ointment Generic drug: hypromellose Place 1 Application into both eyes in the morning, at noon, in the evening, and at bedtime.   GLUCOSAMINE HCL PO Take 2,000 mg by mouth 2 (two) times daily.   hydrocortisone 2.5 % ointment Apply 1 application topically 2 (two) times daily.   LORazepam 0.5 MG tablet Commonly known as: ATIVAN Take one tablet three times daily as needed for anxiety and take one extra tablet as needed for severe anxiety.   lubiprostone 8 MCG capsule Commonly known as: AMITIZA Take 8 mcg by mouth 2 (two) times daily with a meal.   meloxicam 7.5 MG tablet Commonly known as: MOBIC TAKE 1 TABLET BY MOUTH EVERY DAY AS NEEDED FOR ARTHRITIS   Menthol (Topical Analgesic) 4 % Gel Apply 1 application  topically as  needed. Resident may self administer and keep in room   miconazole 2 % powder Commonly known as: MICOTIN Apply topically as needed for itching. apply to legs and feet once a day   mirtazapine 30 MG tablet Commonly known as: REMERON Take 30 mg by mouth at bedtime.   multivitamin with minerals Tabs tablet Take 1 tablet by mouth daily.   NIFEdipine 60 MG 24 hr tablet Commonly known as: PROCARDIA XL/NIFEDICAL XL TAKE 1 TABLET BY MOUTH EVERY DAY TO CONTROL BLOOD PRESSURE   OcuSoft Lid Scrub Plus Pads Place 1 each into both eyes daily.   pantoprazole 40 MG tablet Commonly known as: PROTONIX One daily to reduce stomach acid   PEPTO-BISMOL PO Take 30 mg by mouth as needed. For diarrhea   polyethylene glycol 17 g packet Commonly known as: MIRALAX / GLYCOLAX Take 17 g by mouth daily. Mix 17g  with 8 oz. of fluid daily for constipation   pravastatin 10 MG tablet Commonly known as: PRAVACHOL Take 10 mg by mouth daily.   PRESERVISION AREDS 2+MULTI VIT PO Take 1 tablet by mouth 2 (two) times daily.        Review of Systems  Constitutional:  Negative for activity change and appetite change.  HENT:  Negative for congestion and trouble swallowing.   Eyes:  Negative for visual disturbance.  Respiratory:  Negative for cough, shortness of breath and wheezing.   Cardiovascular:  Negative for chest pain and leg swelling.  Gastrointestinal:  Positive for constipation. Negative for abdominal pain and blood in stool.  Genitourinary:  Negative for dysuria, frequency, hematuria and vaginal bleeding.  Musculoskeletal:  Positive for arthralgias and gait problem.  Skin:  Negative for wound.  Neurological:  Positive for dizziness, weakness and light-headedness. Negative for headaches.  Psychiatric/Behavioral:  Positive for dysphoric mood. Negative for confusion and sleep disturbance. The patient is nervous/anxious.     Immunization History  Administered Date(s) Administered   Fluad  Quad(high Dose 65+) 03/09/2022   Influenza Whole 02/14/2012, 02/14/2013   Influenza, High Dose Seasonal PF 03/14/2017, 02/26/2019   Influenza-Unspecified 02/27/2014, 02/12/2015, 02/25/2016, 03/15/2017, 02/19/2018, 02/18/2020, 03/09/2021   Moderna SARS-COV2 Booster Vaccination 10/21/2020   Moderna Sars-Covid-2 Vaccination 05/20/2019, 06/17/2019, 03/30/2020, 03/22/2022   PFIZER(Purple Top)SARS-COV-2 Vaccination 02/03/2021   Pneumococcal Conjugate-13 07/13/2015   Pneumococcal Polysaccharide-23 05/16/1998   Td 05/16/2004   Zoster Recombinat (Shingrix) 02/20/2018, 05/11/2018, 07/16/2018   Zoster, Live 05/16/2005   Pertinent  Health Maintenance Due  Topic Date Due  INFLUENZA VACCINE  12/15/2022   DEXA SCAN  Completed   MAMMOGRAM  Discontinued      04/25/2018    2:43 PM 04/09/2019    1:22 PM 06/11/2021   11:40 AM 03/17/2022   11:51 AM 07/13/2022   11:31 AM  [place] in the past year? 0 0 1 0 0  Was there an injury with Fall? 0  0 0 0  Fall Risk Category Calculator 0  1 0 0  Fall Risk Category (Retired) Low  Low Low   (RETIRED) Patient Fall Risk Level   Moderate fall risk Low fall risk   Patient at Risk for Falls Due to   History of fall(s);Impaired balance/gait;Impaired mobility;Impaired vision History of fall(s);Impaired balance/gait History of fall(s);Impaired balance/gait;Impaired mobility  Fall risk Follow up   Falls evaluation completed;Education provided Falls evaluation completed Falls evaluation completed;Education provided;Falls prevention discussed   Functional Status Survey:    Vitals:   08/15/22 1043  BP: (!) 141/82  Pulse: 78  Resp: 20  Temp: 98.2 F (36.8 C)  SpO2: 95%  Weight: 139 lb 3.2 oz (63.1 kg)  Height: 5\' 2"  (1.575 m)   Body mass index is 25.46 kg/m. Physical Exam Vitals reviewed.  Constitutional:      General: She is not in acute distress. HENT:     Head: Normocephalic.     Right Ear: There is no impacted cerumen.     Left Ear: There is  no impacted cerumen.     Nose: Nose normal.     Mouth/Throat:     Mouth: Mucous membranes are moist.  Eyes:     General:        Right eye: No discharge.        Left eye: No discharge.  Cardiovascular:     Rate and Rhythm: Normal rate and regular rhythm.     Pulses: Normal pulses.     Heart sounds: Murmur heard.  Pulmonary:     Effort: No respiratory distress.     Breath sounds: Normal breath sounds. No wheezing or rales.  Abdominal:     General: Bowel sounds are normal. There is no distension.     Palpations: Abdomen is soft.     Tenderness: There is no abdominal tenderness.  Musculoskeletal:     Cervical back: Neck supple.     Right lower leg: No edema.     Left lower leg: No edema.  Skin:    General: Skin is warm and dry.     Capillary Refill: Capillary refill takes less than 2 seconds.  Neurological:     General: No focal deficit present.     Mental Status: She is alert and oriented to person, place, and time.     Motor: Weakness present.     Gait: Gait abnormal.     Comments: walker  Psychiatric:        Mood and Affect: Mood normal.        Behavior: Behavior normal.     Labs reviewed: Recent Labs    06/13/22 0000  NA 137  K 4.6  CL 101  CO2 29*  BUN 21  CREATININE 0.9  CALCIUM 10.4   Recent Labs    06/13/22 0000  AST 16  ALT 10  ALKPHOS 70  ALBUMIN 4.3   Recent Labs    06/13/22 0000  WBC 6.7  HGB 13.4  HCT 40  PLT 184   Lab Results  Component Value Date   TSH  2.10 03/08/2021   Lab Results  Component Value Date   HGBA1C 5.5 09/28/2018   Lab Results  Component Value Date   CHOL 165 06/13/2022   HDL 55 06/13/2022   LDLCALC 89 06/13/2022   TRIG 113 06/13/2022   CHOLHDL 3.5 08/04/2017    Significant Diagnostic Results in last 30 days:  No results found.  Assessment/Plan 1. Dark stools - onset x 3 weeks - hgb 13.4 05/24/2022 - on Protonix - cbc/diff- future - hemoccults x 2   2. Generalized anxiety disorder - followed by  psych> remains on monthly therapy sessions - No GDR - cont klonopin and Ativan  3. Major depression, recurrent, chronic - no mood changes - cont Wellbutrin, Remeron and Lexapro  4. Obsessive-compulsive disorder, unspecified type - cont Lexapro  5. Primary hypertension - controlled with Procardia  6. Hyperlipidemia LDL goal <100 - cont statin  7. Gastroesophageal reflux disease without esophagitis - hgb stable - cont Protonix  8. Primary osteoarthritis involving multiple joints - cont mobic prn  9. Age-related osteoporosis without current pathological fracture - DEXA 2018, t score -2.9 - cont calcium/vitamin D   10. Constipation, unspecified constipation type - abdomen soft - cont colace and amitiza    Family/ staff Communication: plan discussed with patient and nurse  Labs/tests ordered: cbc/diff 04/04, hemoccults x 2

## 2022-08-16 DIAGNOSIS — R293 Abnormal posture: Secondary | ICD-10-CM | POA: Diagnosis not present

## 2022-08-16 DIAGNOSIS — M6281 Muscle weakness (generalized): Secondary | ICD-10-CM | POA: Diagnosis not present

## 2022-08-16 DIAGNOSIS — M545 Low back pain, unspecified: Secondary | ICD-10-CM | POA: Diagnosis not present

## 2022-08-16 DIAGNOSIS — R2681 Unsteadiness on feet: Secondary | ICD-10-CM | POA: Diagnosis not present

## 2022-08-16 DIAGNOSIS — M25512 Pain in left shoulder: Secondary | ICD-10-CM | POA: Diagnosis not present

## 2022-08-18 DIAGNOSIS — M545 Low back pain, unspecified: Secondary | ICD-10-CM | POA: Diagnosis not present

## 2022-08-18 DIAGNOSIS — R293 Abnormal posture: Secondary | ICD-10-CM | POA: Diagnosis not present

## 2022-08-18 DIAGNOSIS — M25512 Pain in left shoulder: Secondary | ICD-10-CM | POA: Diagnosis not present

## 2022-08-18 DIAGNOSIS — D649 Anemia, unspecified: Secondary | ICD-10-CM | POA: Diagnosis not present

## 2022-08-18 DIAGNOSIS — M6281 Muscle weakness (generalized): Secondary | ICD-10-CM | POA: Diagnosis not present

## 2022-08-18 DIAGNOSIS — R2681 Unsteadiness on feet: Secondary | ICD-10-CM | POA: Diagnosis not present

## 2022-08-18 LAB — CBC AND DIFFERENTIAL
HCT: 40 (ref 36–46)
Hemoglobin: 13 (ref 12.0–16.0)
Neutrophils Absolute: 3853
Platelets: 179 10*3/uL (ref 150–400)
WBC: 5.9

## 2022-08-18 LAB — CBC: RBC: 4.32 (ref 3.87–5.11)

## 2022-08-22 DIAGNOSIS — M25512 Pain in left shoulder: Secondary | ICD-10-CM | POA: Diagnosis not present

## 2022-08-22 DIAGNOSIS — M545 Low back pain, unspecified: Secondary | ICD-10-CM | POA: Diagnosis not present

## 2022-08-22 DIAGNOSIS — R2681 Unsteadiness on feet: Secondary | ICD-10-CM | POA: Diagnosis not present

## 2022-08-22 DIAGNOSIS — R293 Abnormal posture: Secondary | ICD-10-CM | POA: Diagnosis not present

## 2022-08-22 DIAGNOSIS — M6281 Muscle weakness (generalized): Secondary | ICD-10-CM | POA: Diagnosis not present

## 2022-08-24 DIAGNOSIS — M25512 Pain in left shoulder: Secondary | ICD-10-CM | POA: Diagnosis not present

## 2022-08-24 DIAGNOSIS — R293 Abnormal posture: Secondary | ICD-10-CM | POA: Diagnosis not present

## 2022-08-24 DIAGNOSIS — R2681 Unsteadiness on feet: Secondary | ICD-10-CM | POA: Diagnosis not present

## 2022-08-24 DIAGNOSIS — F411 Generalized anxiety disorder: Secondary | ICD-10-CM | POA: Diagnosis not present

## 2022-08-24 DIAGNOSIS — M6281 Muscle weakness (generalized): Secondary | ICD-10-CM | POA: Diagnosis not present

## 2022-08-24 DIAGNOSIS — M545 Low back pain, unspecified: Secondary | ICD-10-CM | POA: Diagnosis not present

## 2022-09-20 DIAGNOSIS — M79671 Pain in right foot: Secondary | ICD-10-CM | POA: Diagnosis not present

## 2022-09-20 DIAGNOSIS — M79672 Pain in left foot: Secondary | ICD-10-CM | POA: Diagnosis not present

## 2022-09-20 DIAGNOSIS — B351 Tinea unguium: Secondary | ICD-10-CM | POA: Diagnosis not present

## 2022-09-21 DIAGNOSIS — F411 Generalized anxiety disorder: Secondary | ICD-10-CM | POA: Diagnosis not present

## 2022-10-04 DIAGNOSIS — F411 Generalized anxiety disorder: Secondary | ICD-10-CM | POA: Diagnosis not present

## 2022-10-11 ENCOUNTER — Other Ambulatory Visit: Payer: Self-pay | Admitting: Adult Health

## 2022-10-11 DIAGNOSIS — F411 Generalized anxiety disorder: Secondary | ICD-10-CM

## 2022-10-11 MED ORDER — LORAZEPAM 0.5 MG PO TABS
ORAL_TABLET | ORAL | 0 refills | Status: DC
Start: 2022-10-11 — End: 2022-11-03

## 2022-10-19 DIAGNOSIS — F411 Generalized anxiety disorder: Secondary | ICD-10-CM | POA: Diagnosis not present

## 2022-11-03 ENCOUNTER — Other Ambulatory Visit: Payer: Self-pay | Admitting: Internal Medicine

## 2022-11-03 DIAGNOSIS — F411 Generalized anxiety disorder: Secondary | ICD-10-CM

## 2022-11-03 MED ORDER — LORAZEPAM 0.5 MG PO TABS: ORAL_TABLET | ORAL | 0 refills | Status: DC

## 2022-11-23 DIAGNOSIS — F411 Generalized anxiety disorder: Secondary | ICD-10-CM | POA: Diagnosis not present

## 2022-11-30 ENCOUNTER — Non-Acute Institutional Stay: Payer: Medicare Other | Admitting: Orthopedic Surgery

## 2022-11-30 ENCOUNTER — Encounter: Payer: Self-pay | Admitting: Orthopedic Surgery

## 2022-11-30 DIAGNOSIS — F411 Generalized anxiety disorder: Secondary | ICD-10-CM | POA: Diagnosis not present

## 2022-11-30 DIAGNOSIS — F339 Major depressive disorder, recurrent, unspecified: Secondary | ICD-10-CM | POA: Diagnosis not present

## 2022-11-30 MED ORDER — MIRTAZAPINE 45 MG PO TABS
45.0000 mg | ORAL_TABLET | Freq: Every day | ORAL | Status: AC
Start: 2022-11-30 — End: ?

## 2022-11-30 NOTE — Progress Notes (Signed)
Location:   [facility] Room Number: 8-A Place of Service:  ALF [phone]) Provider:  Hazle Nordmann, NP  PCP: Mahlon Gammon, MD  Patient Care Team: Mahlon Gammon, MD as PCP - General (Internal Medicine) Shelva Majestic, [facility] Ernesto Rutherford, MD as Consulting Physician (Ophthalmology) Rachael Fee, MD as Consulting Physician (Gastroenterology) Archer Asa, MD as Consulting Physician (Psychiatry) Kerrin Champagne, MD (Inactive) as Consulting Physician (Orthopedic Surgery) Alfredo Martinez, MD as Consulting Physician (Urology)  Extended Emergency Contact Information Primary Emergency Contact: [facility] Phone: [phone] Relation: Son Secondary Emergency Contact: [address] [place], [place] [address] [name] of [place] Home Phone: [phone] Mobile Phone: [phone] Relation: Son  Code Status:  DNR Goals of care: Advanced Directive information    11/30/2022   10:04 AM  Advanced Directives  Does Patient Have a Medical Advance Directive? Yes  Type of Estate agent of [place];Living will;Out of facility DNR (pink MOST or yellow form)  Does patient want to make changes to medical advance directive? No - Patient declined  Copy of Healthcare Power of Attorney in Chart? Yes - validated most recent copy scanned in chart (See row information)     Chief Complaint  Patient presents with   Acute Visit    Increased depression/anxiety.    HPI:  Pt is a 89 y.o. female seen today for an acute visit due to increased depression and anxiety.   She currently resides on the assisted living unit at [facility]. PMH: HTN, HLD, GAD, major depression, left breast surgery 1969, oophorectomy 2002, cholecystectomy, chronic back pain, spinal stenosis, OA, osteoporosis.   H/o depression and anxiety. She is followed by psychiatry> last seen 09/2022. In the past 3 days, she has felt increased anxiety. She denies panic attacks.  She is having trouble sleeping. Calming music and massage have not reduced anxious feeling. She is currently taking Wellbutrin, Lexapro, mirtazapine, clonazepam and ativan. PHQ score 11 today. Afebrile. Vitals stable.    Past Medical History:  Diagnosis Date   Abnormal mammogram, unspecified 04/19/2011   Allergy    Anxiety    Backache, unspecified 01/21/2005   Cervicalgia 08/20/2013   Chronic kidney disease, stage II (mild) 04/03/2009   Chronic rhinitis 09/28/2010   DDD (degenerative disc disease)    Depression    Dermatophytosis of nail 01/21/2005   Diverticulosis of colon (without mention of hemorrhage) 06/17/2005   Edema 11/26/2004   GERD (gastroesophageal reflux disease)    Hiatal hernia    High cholesterol    Hyperglycemia 11/18/2014   Hypertension    Insomnia, unspecified 10/18/2011   Left inguinal hernia 04/24/2014   Nocturia 09/19/2008   Osteoporosis, senile 11/18/2014   Other abnormal blood chemistry 04/03/2007   Pain in joint, pelvic region and thigh 11/12/2011   Pain in joint, shoulder region 08/15/2006   Pain in limb 08/26/2005   Palpitations 09/29/2009   PAOD (peripheral arterial occlusive disease) (HCC) 11/18/2014   Right leg; diminished popliteal, dorsalis pedis, and posterior tibial artery pulsations    Postmenopausal atrophic vaginitis 12/14/2004   Scoliosis    Senile osteoporosis 12/14/2004   Tension headache 10/04/2005   Unspecified cataract 11/26/2004   Unspecified constipation 01/21/2005   Unspecified urinary incontinence 11/26/2004   Vaginal stenosis 11/18/2014   Tight band about 1/2 inches into the vagina which will not allow passage of my index finger.    Past Surgical History:  Procedure Laterality Date  ABDOMINAL HYSTERECTOMY  1969   Dr.Braun    bilateral eyelid surgery  2005   Dr.Scott   BILATERAL OOPHORECTOMY  2002   BREAST SURGERY     benign tumor removal, left breast 1969   CATARACT EXTRACTION     Right   CHOLECYSTECTOMY  1990   Dr.Moore    COLONOSCOPY  3/18/20008    inflammation, diverticular associated colitis -Dr. Christella Hartigan   COSMETIC SURGERY     ESOPHAGOGASTRODUODENOSCOPY N/A 01/24/2014   Procedure: ESOPHAGOGASTRODUODENOSCOPY (EGD);  Surgeon: Hilarie Fredrickson, MD;  Location: [facility] ENDOSCOPY;  Service: Endoscopy;  Laterality: N/A;   ESOPHAGOGASTRODUODENOSCOPY ENDOSCOPY  04/04/14   Dr. Christella Hartigan   EYE SURGERY     FOREIGN BODY REMOVAL N/A 01/24/2014   Procedure: FOREIGN BODY REMOVAL;  Surgeon: Hilarie Fredrickson, MD;  Location: [facility] ENDOSCOPY;  Service: Endoscopy;  Laterality: N/A;   HIP PINNING,CANNULATED  11/07/2011   Procedure: CANNULATED HIP PINNING;  Surgeon: Kerrin Champagne, MD;  Location: [facility] ORS;  Service: Orthopedics;  Laterality: Left;   ORIF FEMORAL NECK FRACTURE W/ DHS  11/07/2011   Dr.Nitka    TONSILLECTOMY     Dr.Joe Katrinka Blazing   TUBAL LIGATION     VAGINAL PROLAPSE REPAIR  2002   Dr.Horback     Allergies  Allergen Reactions   Brexpiprazole Swelling   Lamictal [Lamotrigine] Swelling    Lip swelling   Penicillins Other (See Comments)    CHILDHOOD REACTION   Abilify [Aripiprazole] Rash    Allergies as of 11/30/2022       Reactions   Brexpiprazole Swelling   Lamictal [lamotrigine] Swelling   Lip swelling   Penicillins Other (See Comments)   CHILDHOOD REACTION   Abilify [aripiprazole] Rash        Medication List        Accurate as of November 30, 2022 10:04 AM. If you have any questions, ask your nurse or doctor.          Acetylcysteine 600 MG Caps Take 1 capsule by mouth daily.   antiseptic oral rinse Liqd 10 mLs by Mouth Rinse route as needed for dry mouth. Resident may self administer and keep in room   aspirin 81 MG chewable tablet Chew 81 mg by mouth daily.   buPROPion 300 MG 24 hr tablet Commonly known as: WELLBUTRIN XL Take 1 tablet (300 mg total) by mouth daily.   Calcium Carb-Cholecalciferol 600-800 MG-UNIT Tabs Take 1 tablet by mouth 2 (two) times daily.   cholecalciferol 1000 units tablet Commonly known as: VITAMIN D Take  1,000 Units by mouth daily.   clonazePAM 0.5 MG tablet Commonly known as: KLONOPIN Take 0.5 mg by mouth as needed for anxiety. Take 1/2 tablet up to 4 times daily as needed for anxiety.   docusate sodium 100 MG capsule Commonly known as: Colace Take 1 capsule (100 mg total) by mouth daily.   escitalopram 20 MG tablet Commonly known as: LEXAPRO Take 1 tablet (20 mg total) by mouth daily.   GenTeal Severe 0.3 % Gel ophthalmic ointment Generic drug: hypromellose Place 1 Application into both eyes as needed for dry eyes.   GenTeal Severe 0.3 % Gel ophthalmic ointment Generic drug: hypromellose Place 1 Application into both eyes in the morning, at noon, in the evening, and at bedtime.   GLUCOSAMINE HCL PO Take 2,000 mg by mouth 2 (two) times daily.   hydrocortisone 2.5 % ointment Apply 1 application topically 2 (two) times daily.   LORazepam 0.5 MG tablet Commonly known as:  ATIVAN Take one tablet three times daily as needed for anxiety and take one extra tablet as needed for severe anxiety.   lubiprostone 8 MCG capsule Commonly known as: AMITIZA Take 8 mcg by mouth 2 (two) times daily with a meal.   meloxicam 7.5 MG tablet Commonly known as: MOBIC TAKE 1 TABLET BY MOUTH EVERY DAY AS NEEDED FOR ARTHRITIS   Menthol (Topical Analgesic) 4 % Gel Apply 1 application  topically as needed. Resident may self administer and keep in room   miconazole 2 % powder Commonly known as: MICOTIN Apply topically as needed for itching. apply to legs and feet once a day   mirtazapine 30 MG tablet Commonly known as: REMERON Take 30 mg by mouth at bedtime.   multivitamin with minerals Tabs tablet Take 1 tablet by mouth daily.   NIFEdipine 60 MG 24 hr tablet Commonly known as: PROCARDIA XL/NIFEDICAL XL TAKE 1 TABLET BY MOUTH EVERY DAY TO CONTROL BLOOD PRESSURE   OcuSoft Lid Scrub Plus Pads Place 1 each into both eyes daily.   pantoprazole 40 MG tablet Commonly known as: PROTONIX One  daily to reduce stomach acid   PEPTO-BISMOL PO Take 30 mg by mouth as needed. For diarrhea   polyethylene glycol 17 g packet Commonly known as: MIRALAX / GLYCOLAX Take 17 g by mouth daily. Mix 17g  with 8 oz. of fluid daily for constipation   pravastatin 10 MG tablet Commonly known as: PRAVACHOL Take 10 mg by mouth daily.   PRESERVISION AREDS 2+MULTI VIT PO Take 1 tablet by mouth 2 (two) times daily.        Review of Systems  Constitutional:  Negative for activity change and appetite change.  Respiratory:  Negative for cough and shortness of breath.   Cardiovascular:  Negative for chest pain and leg swelling.  Psychiatric/Behavioral:  Positive for decreased concentration, dysphoric mood and sleep disturbance. The patient is nervous/anxious.     Immunization History  Administered Date(s) Administered   Fluad Quad(high Dose 65+) 03/09/2022   Influenza Whole 02/14/2012, 02/14/2013   Influenza, High Dose Seasonal PF 03/14/2017, 02/26/2019   Influenza-Unspecified 02/27/2014, 02/12/2015, 02/25/2016, 03/15/2017, 02/19/2018, 02/18/2020, 03/09/2021   Moderna SARS-COV2 Booster Vaccination 10/21/2020   Moderna Sars-Covid-2 Vaccination 05/20/2019, 06/17/2019, 03/30/2020, 03/22/2022   PFIZER(Purple Top)SARS-COV-2 Vaccination 02/03/2021   Pneumococcal Conjugate-13 07/13/2015   Pneumococcal Polysaccharide-23 05/16/1998   Td 05/16/2004   Zoster Recombinant(Shingrix) 02/20/2018, 05/11/2018, 07/16/2018   Zoster, Live 05/16/2005   Pertinent  Health Maintenance Due  Topic Date Due   INFLUENZA VACCINE  12/15/2022   DEXA SCAN  Completed   MAMMOGRAM  Discontinued      04/25/2018    2:43 PM 04/09/2019    1:22 PM 06/11/2021   11:40 AM 03/17/2022   11:51 AM 07/13/2022   11:31 AM  Fall Risk  Falls in the past year? 0 0 1 0 0  Was there an injury with Fall? 0  0 0 0  Fall Risk Category Calculator 0  1 0 0  Fall Risk Category (Retired) Low  Low Low   (RETIRED) Patient Fall Risk Level    Moderate fall risk Low fall risk   Patient at Risk for Falls Due to   History of fall(s);Impaired balance/gait;Impaired mobility;Impaired vision History of fall(s);Impaired balance/gait History of fall(s);Impaired balance/gait;Impaired mobility  Fall risk Follow up   Falls evaluation completed;Education provided Falls evaluation completed Falls evaluation completed;Education provided;Falls prevention discussed   Functional Status Survey:    Vitals:   11/30/22 [phone]  BP: 120/73  Pulse: 83  Resp: 18  Temp: 97.7 F (36.5 C)  SpO2: 94%  Weight: 143 lb (64.9 kg)  Height: 5\' 2"  (1.575 m)   Body mass index is 26.16 kg/m. Physical Exam Vitals reviewed.  Constitutional:      General: She is not in acute distress. HENT:     Head: Normocephalic.  Eyes:     General:        Right eye: No discharge.        Left eye: No discharge.  Cardiovascular:     Rate and Rhythm: Normal rate and regular rhythm.     Pulses: Normal pulses.     Heart sounds: Murmur heard.  Pulmonary:     Effort: Pulmonary effort is normal. No respiratory distress.     Breath sounds: Normal breath sounds. No wheezing.  Abdominal:     General: Bowel sounds are normal.     Palpations: Abdomen is soft.  Musculoskeletal:     Cervical back: Neck supple.     Right lower leg: No edema.     Left lower leg: No edema.  Skin:    General: Skin is warm.     Capillary Refill: Capillary refill takes less than 2 seconds.  Neurological:     General: No focal deficit present.     Mental Status: She is alert and oriented to person, place, and time.     Motor: Weakness present.     Gait: Gait abnormal.     Comments: Walker/wheelchair  Psychiatric:        Mood and Affect: Mood is anxious.     Labs reviewed: Recent Labs    06/13/22 0000  NA 137  K 4.6  CL 101  CO2 29*  BUN 21  CREATININE 0.9  CALCIUM 10.4   Recent Labs    06/13/22 0000  AST 16  ALT 10  ALKPHOS 70  ALBUMIN 4.3   Recent Labs    06/13/22 0000  08/18/22 0000  WBC 6.7 5.9  NEUTROABS  --  3,853.00  HGB 13.4 13.0  HCT 40 40  PLT 184 179   Lab Results  Component Value Date   TSH 2.10 03/08/2021   Lab Results  Component Value Date   HGBA1C 5.5 09/28/2018   Lab Results  Component Value Date   CHOL 165 06/13/2022   HDL 55 06/13/2022   LDLCALC 89 06/13/2022   TRIG 113 06/13/2022   CHOLHDL 3.5 08/04/2017    Significant Diagnostic Results in last 30 days:  No results found.  Assessment/Plan 1. Major depression, recurrent, chronic (HCC) - ongoing - followed by psych - PHQ score 11 today - will increase Remeron to 45 mg daily> recommended by psych - cont Wellbutrin and Lexapro - mirtazapine (REMERON) 45 MG tablet; Take 1 tablet (45 mg total) by mouth at bedtime.  2. GAD (generalized anxiety disorder) - ongoing - cont Ativan and clonazepam   Family/ staff Communication: plan discussed with patient and nurse  Labs/tests ordered:  none

## 2022-12-05 DIAGNOSIS — R0781 Pleurodynia: Secondary | ICD-10-CM | POA: Diagnosis not present

## 2022-12-06 DIAGNOSIS — F411 Generalized anxiety disorder: Secondary | ICD-10-CM | POA: Diagnosis not present

## 2022-12-19 ENCOUNTER — Other Ambulatory Visit: Payer: Self-pay | Admitting: Orthopedic Surgery

## 2022-12-19 DIAGNOSIS — F411 Generalized anxiety disorder: Secondary | ICD-10-CM

## 2022-12-19 MED ORDER — LORAZEPAM 0.5 MG PO TABS
ORAL_TABLET | ORAL | 0 refills | Status: DC
Start: 2022-12-19 — End: 2023-01-09

## 2022-12-22 ENCOUNTER — Non-Acute Institutional Stay: Payer: Medicare Other | Admitting: Internal Medicine

## 2022-12-22 DIAGNOSIS — F411 Generalized anxiety disorder: Secondary | ICD-10-CM | POA: Diagnosis not present

## 2022-12-22 DIAGNOSIS — E785 Hyperlipidemia, unspecified: Secondary | ICD-10-CM

## 2022-12-22 DIAGNOSIS — I10 Essential (primary) hypertension: Secondary | ICD-10-CM | POA: Diagnosis not present

## 2022-12-22 DIAGNOSIS — R42 Dizziness and giddiness: Secondary | ICD-10-CM

## 2022-12-22 DIAGNOSIS — F339 Major depressive disorder, recurrent, unspecified: Secondary | ICD-10-CM | POA: Diagnosis not present

## 2022-12-22 DIAGNOSIS — K219 Gastro-esophageal reflux disease without esophagitis: Secondary | ICD-10-CM

## 2022-12-22 NOTE — Progress Notes (Signed)
[facility] Service:  ALF (13)  Provider:   Code Status: full Code Goals of Care:     11/30/2022   10:04 AM  Advanced Directives  Does Patient Have a Medical Advance Directive? Yes  Type of Estate agent of [place];Living will;Out of facility DNR (pink MOST or yellow form)  Does patient want to make changes to medical advance directive? No - Patient declined  Copy of Healthcare Power of Attorney in Chart? Yes - validated most recent copy scanned in chart (See row information)     Chief Complaint  Patient presents with   Medical Management of Chronic Issues    HPI: Patient is a 89 y.o. female seen today for medical management of chronic diseases.   Lives in [place]   Patient has h/o Major Depression with Anxiety, Hyperlipidemia, Chronic Bilateral Low Back Pain, Spinal Stenosis , Osteoarthritis, history of osteoporosis, hypertension Dizziness Per Neurology No more work up  Patient continues to have c/o Extreme anxiety Her Remeron was increased but she says it did not help She was very anxious this morning Feels Tired Dizzy She does follow with Psychologist/Psychiatrist Stays in her Room Walk with her walker No behavior issues Cognitively doing well Weight and appetite is stable Wt Readings from Last 3 Encounters:  12/22/22 140 lb 6.4 oz (63.7 kg)  11/30/22 143 lb (64.9 kg)  08/15/22 139 lb 3.2 oz (63.1 kg)       Past Medical History:  Diagnosis Date   Abnormal mammogram, unspecified 04/19/2011   Allergy    Anxiety    Backache, unspecified 01/21/2005   Cervicalgia 08/20/2013   Chronic kidney disease, stage II (mild) 04/03/2009   Chronic rhinitis 09/28/2010   DDD (degenerative disc disease)    Depression    Dermatophytosis of nail 01/21/2005   Diverticulosis of colon (without mention of hemorrhage) 06/17/2005   Edema 11/26/2004   GERD (gastroesophageal reflux disease)    Hiatal hernia    High cholesterol    Hyperglycemia  11/18/2014   Hypertension    Insomnia, unspecified 10/18/2011   Left inguinal hernia 04/24/2014   Nocturia 09/19/2008   Osteoporosis, senile 11/18/2014   Other abnormal blood chemistry 04/03/2007   Pain in joint, pelvic region and thigh 11/12/2011   Pain in joint, shoulder region 08/15/2006   Pain in limb 08/26/2005   Palpitations 09/29/2009   PAOD (peripheral arterial occlusive disease) (HCC) 11/18/2014   Right leg; diminished popliteal, dorsalis pedis, and posterior tibial artery pulsations    Postmenopausal atrophic vaginitis 12/14/2004   Scoliosis    Senile osteoporosis 12/14/2004   Tension headache 10/04/2005   Unspecified cataract 11/26/2004   Unspecified constipation 01/21/2005   Unspecified urinary incontinence 11/26/2004   Vaginal stenosis 11/18/2014   Tight band about 1/2 inches into the vagina which will not allow passage of my index finger.     Past Surgical History:  Procedure Laterality Date   ABDOMINAL HYSTERECTOMY  1969   Dr.Braun    bilateral eyelid surgery  2005   Dr.Scott   BILATERAL OOPHORECTOMY  2002   BREAST SURGERY     benign tumor removal, left breast 1969   CATARACT EXTRACTION     Right   CHOLECYSTECTOMY  1990   Dr.Moore    COLONOSCOPY  3/18/20008   inflammation, diverticular associated colitis -Dr. Christella Hartigan   COSMETIC SURGERY     ESOPHAGOGASTRODUODENOSCOPY N/A 01/24/2014   Procedure: ESOPHAGOGASTRODUODENOSCOPY (EGD);  Surgeon: Hilarie Fredrickson, MD;  Location:  [facility] ENDOSCOPY;  Service: Endoscopy;  Laterality: N/A;   ESOPHAGOGASTRODUODENOSCOPY ENDOSCOPY  04/04/14   Dr. Christella Hartigan   EYE SURGERY     FOREIGN BODY REMOVAL N/A 01/24/2014   Procedure: FOREIGN BODY REMOVAL;  Surgeon: Hilarie Fredrickson, MD;  Location: [facility] ENDOSCOPY;  Service: Endoscopy;  Laterality: N/A;   HIP PINNING,CANNULATED  11/07/2011   Procedure: CANNULATED HIP PINNING;  Surgeon: Kerrin Champagne, MD;  Location: [facility] ORS;  Service: Orthopedics;  Laterality: Left;   ORIF FEMORAL NECK FRACTURE W/ DHS  11/07/2011   Dr.Nitka     TONSILLECTOMY     Dr.Joe Katrinka Blazing   TUBAL LIGATION     VAGINAL PROLAPSE REPAIR  2002   Dr.Horback     Allergies  Allergen Reactions   Brexpiprazole Swelling   Lamictal [Lamotrigine] Swelling    Lip swelling   Penicillins Other (See Comments)    CHILDHOOD REACTION   Abilify [Aripiprazole] Rash    Outpatient Encounter Medications as of 12/22/2022  Medication Sig   Acetylcysteine 600 MG CAPS Take 1 capsule by mouth daily.   antiseptic oral rinse (BIOTENE) LIQD 10 mLs by Mouth Rinse route as needed for dry mouth. Resident may self administer and keep in room   aspirin 81 MG chewable tablet Chew 81 mg by mouth daily.   Bismuth Subsalicylate (PEPTO-BISMOL PO) Take 30 mg by mouth as needed. For diarrhea   buPROPion (WELLBUTRIN XL) 300 MG 24 hr tablet Take 1 tablet (300 mg total) by mouth daily.   Calcium Carb-Cholecalciferol 600-800 MG-UNIT TABS Take 1 tablet by mouth 2 (two) times daily.   cholecalciferol (VITAMIN D) 1000 UNITS tablet Take 1,000 Units by mouth daily.   clonazePAM (KLONOPIN) 0.5 MG tablet Take 0.5 mg by mouth as needed for anxiety. Take 1/2 tablet up to 4 times daily as needed for anxiety.   docusate sodium (COLACE) 100 MG capsule Take 1 capsule (100 mg total) by mouth daily.   escitalopram (LEXAPRO) 20 MG tablet Take 1 tablet (20 mg total) by mouth daily.   Eyelid Cleansers (OCUSOFT LID SCRUB PLUS) PADS Place 1 each into both eyes daily.   GLUCOSAMINE HCL PO Take 2,000 mg by mouth 2 (two) times daily.   hydrocortisone 2.5 % ointment Apply 1 application topically 2 (two) times daily.   hypromellose (GENTEAL SEVERE) 0.3 % GEL ophthalmic ointment Place 1 Application into both eyes as needed for dry eyes.   hypromellose (GENTEAL SEVERE) 0.3 % GEL ophthalmic ointment Place 1 Application into both eyes in the morning, at noon, in the evening, and at bedtime.   LORazepam (ATIVAN) 0.5 MG tablet Take one tablet three times daily as needed for anxiety and take one extra tablet as needed  for severe anxiety.   lubiprostone (AMITIZA) 8 MCG capsule Take 8 mcg by mouth 2 (two) times daily with a meal.   meloxicam (MOBIC) 7.5 MG tablet TAKE 1 TABLET BY MOUTH EVERY DAY AS NEEDED FOR ARTHRITIS   Menthol, Topical Analgesic, 4 % GEL Apply 1 application  topically as needed. Resident may self administer and keep in room   miconazole (MICOTIN) 2 % powder Apply topically as needed for itching. apply to legs and feet once a day   mirtazapine (REMERON) 45 MG tablet Take 1 tablet (45 mg total) by mouth at bedtime.   Multiple Vitamin (MULTIVITAMIN WITH MINERALS) TABS Take 1 tablet by mouth daily.   Multiple Vitamins-Minerals (PRESERVISION AREDS 2+MULTI VIT PO) Take 1 tablet by mouth 2 (two) times daily.   NIFEdipine (  PROCARDIA XL/ADALAT-CC) 60 MG 24 hr tablet TAKE 1 TABLET BY MOUTH EVERY DAY TO CONTROL BLOOD PRESSURE   pantoprazole (PROTONIX) 40 MG tablet One daily to reduce stomach acid   polyethylene glycol (MIRALAX / GLYCOLAX) 17 g packet Take 17 g by mouth daily. Mix 17g  with 8 oz. of fluid daily for constipation   pravastatin (PRAVACHOL) 10 MG tablet Take 10 mg by mouth daily.   No facility-administered encounter medications on file as of 12/22/2022.    Review of Systems:  Review of Systems  Constitutional:  Negative for activity change and appetite change.  HENT: Negative.    Respiratory:  Negative for cough and shortness of breath.   Cardiovascular:  Negative for leg swelling.  Gastrointestinal:  Negative for constipation.  Genitourinary: Negative.   Musculoskeletal:  Positive for gait problem. Negative for arthralgias and myalgias.  Skin: Negative.   Neurological:  Negative for dizziness and weakness.  Psychiatric/Behavioral:  Positive for dysphoric mood. Negative for confusion and sleep disturbance. The patient is nervous/anxious.     Health Maintenance  Topic Date Due   INFLUENZA VACCINE  12/15/2022   COVID-19 Vaccine (6 - 2023-24 season) 01/13/2023 (Originally 05/17/2022)    Medicare Annual Wellness (AWV)  07/14/2023   Pneumonia Vaccine 32+ Years old  Completed   DEXA SCAN  Completed   Zoster Vaccines- Shingrix  Completed   HPV VACCINES  Aged Out   DTaP/Tdap/Td  Discontinued   MAMMOGRAM  Discontinued    Physical Exam: Vitals:   12/22/22 1342  BP: (!) 105/56  Pulse: 81  Resp: 18  Temp: 97.9 F (36.6 C)  Weight: 140 lb 6.4 oz (63.7 kg)   Body mass index is 25.68 kg/m. Physical Exam Vitals reviewed.  Constitutional:      Appearance: Normal appearance.  HENT:     Head: Normocephalic.     Nose: Nose normal.     Mouth/Throat:     Mouth: Mucous membranes are moist.     Pharynx: Oropharynx is clear.  Eyes:     Pupils: Pupils are equal, round, and reactive to light.  Cardiovascular:     Rate and Rhythm: Normal rate and regular rhythm.     Pulses: Normal pulses.     Heart sounds: Normal heart sounds. No murmur heard. Pulmonary:     Effort: Pulmonary effort is normal.     Breath sounds: Normal breath sounds.  Abdominal:     General: Abdomen is flat. Bowel sounds are normal.     Palpations: Abdomen is soft.  Musculoskeletal:        General: No swelling.     Cervical back: Neck supple.  Skin:    General: Skin is warm.  Neurological:     General: No focal deficit present.     Mental Status: She is alert and oriented to person, place, and time.  Psychiatric:        Mood and Affect: Mood normal.        Thought Content: Thought content normal.     Labs reviewed: Basic Metabolic Panel: Recent Labs    06/13/22 0000  NA 137  K 4.6  CL 101  CO2 29*  BUN 21  CREATININE 0.9  CALCIUM 10.4   Liver Function Tests: Recent Labs    06/13/22 0000  AST 16  ALT 10  ALKPHOS 70  ALBUMIN 4.3   No results for input(s): "LIPASE", "AMYLASE" in the last 8760 hours. No results for input(s): "AMMONIA" in the last 8760 hours. CBC:  Recent Labs    06/13/22 0000 08/18/22 0000  WBC 6.7 5.9  NEUTROABS  --  3,853.00  HGB 13.4 13.0  HCT 40 40   PLT 184 179   Lipid Panel: Recent Labs    06/13/22 0000  CHOL 165  HDL 55  LDLCALC 89  TRIG 113   Lab Results  Component Value Date   HGBA1C 5.5 09/28/2018    Procedures since last visit: No results found.  Assessment/Plan 1. Major depression, recurrent, chronic (HCC) Continues to be her issue Wellbutrin, Lexapro   2. GAD (generalized anxiety disorder) Talked about decreasing the Wellbutrin She is refusing Psychologist suggested vistaril PRN Also on Xanax Ativan and Klonopin prn  3. Hyperlipidemia LDL goal <100 Statin Check Lipid   4. Dizziness Caused by her Anxiety Per Neurology No More work up Encourage PO fluids    5. Primary hypertension On Procardia   6. Gastroesophageal reflux disease without esophagitis Protonix 7Constipation,  AMitiza 8 Primary osteoarthritis involving multiple joints Mobic PRN Rarely uses it  Labs/tests ordered:  CBC,CMP,TSH, Lipid Next appt:  Visit date not found

## 2022-12-23 ENCOUNTER — Encounter: Payer: Self-pay | Admitting: Internal Medicine

## 2022-12-26 ENCOUNTER — Non-Acute Institutional Stay: Payer: Medicare Other | Admitting: Orthopedic Surgery

## 2022-12-26 ENCOUNTER — Encounter: Payer: Self-pay | Admitting: Orthopedic Surgery

## 2022-12-26 DIAGNOSIS — F339 Major depressive disorder, recurrent, unspecified: Secondary | ICD-10-CM | POA: Diagnosis not present

## 2022-12-26 DIAGNOSIS — D649 Anemia, unspecified: Secondary | ICD-10-CM | POA: Diagnosis not present

## 2022-12-26 DIAGNOSIS — F411 Generalized anxiety disorder: Secondary | ICD-10-CM | POA: Diagnosis not present

## 2022-12-26 DIAGNOSIS — E785 Hyperlipidemia, unspecified: Secondary | ICD-10-CM | POA: Diagnosis not present

## 2022-12-26 DIAGNOSIS — E039 Hypothyroidism, unspecified: Secondary | ICD-10-CM | POA: Diagnosis not present

## 2022-12-26 MED ORDER — HYDROXYZINE HCL 10 MG PO TABS
10.0000 mg | ORAL_TABLET | Freq: Two times a day (BID) | ORAL | Status: AC
Start: 2022-12-26 — End: ?

## 2022-12-26 NOTE — Progress Notes (Signed)
Location:  [facility] Room Number: 8/A Place of Service:  ALF [phone]) Provider:  Octavia Heir, NP   Mahlon Gammon, MD  Patient Care Team: Mahlon Gammon, MD as PCP - General (Internal Medicine) Shelva Majestic, [facility] Ernesto Rutherford, MD as Consulting Physician (Ophthalmology) Rachael Fee, MD as Consulting Physician (Gastroenterology) Archer Asa, MD as Consulting Physician (Psychiatry) Kerrin Champagne, MD (Inactive) as Consulting Physician (Orthopedic Surgery) Alfredo Martinez, MD as Consulting Physician (Urology)  Extended Emergency Contact Information Primary Emergency Contact: [facility] Phone: [phone] Relation: Son Secondary Emergency Contact: [address] [place], [place] [address] [name] of [place] Home Phone: [phone] Mobile Phone: [phone] Relation: Son  Code Status: Full code Goals of care: Advanced Directive information    11/30/2022   10:04 AM  Advanced Directives  Does Patient Have a Medical Advance Directive? Yes  Type of Estate agent of [place];Living will;Out of facility DNR (pink MOST or yellow form)  Does patient want to make changes to medical advance directive? No - Patient declined  Copy of Healthcare Power of Attorney in Chart? Yes - validated most recent copy scanned in chart (See row information)     Chief Complaint  Patient presents with   Acute Visit    Increased anxiety    HPI:  Pt is a 89 y.o. female seen today for acute visit due to ongoing anxiety.  She currently resides on the assisted living unit at [facility]. PMH: HTN, HLD, GAD, major depression, left breast surgery 1969, oophorectomy 2002, cholecystectomy, chronic back pain, spinal stenosis, OA, osteoporosis.   Long history of depression and anxiety throughout her life. She is followed by psychiatry. 07/17 Remeron was increased due to increased depression, PHQ score was 11 at that time. 08/08  she was seen by Dr. Chales Abrahams for increased anxiety. She is prescribed clonazepam prn but would not take due to unknown side effect. Nursing reports she has taken in past without complication. 08/09 hydroxyzine 10 mg po BID was started. Today, she denies panic attacks over the weekend. PHQ score 10. She reports less anxiety since starting new medication. Denies drowsiness. No recent falls reported. Afebrile. Vitals stable.    Past Medical History:  Diagnosis Date   Abnormal mammogram, unspecified 04/19/2011   Allergy    Anxiety    Backache, unspecified 01/21/2005   Cervicalgia 08/20/2013   Chronic kidney disease, stage II (mild) 04/03/2009   Chronic rhinitis 09/28/2010   DDD (degenerative disc disease)    Depression    Dermatophytosis of nail 01/21/2005   Diverticulosis of colon (without mention of hemorrhage) 06/17/2005   Edema 11/26/2004   GERD (gastroesophageal reflux disease)    Hiatal hernia    High cholesterol    Hyperglycemia 11/18/2014   Hypertension    Insomnia, unspecified 10/18/2011   Left inguinal hernia 04/24/2014   Nocturia 09/19/2008   Osteoporosis, senile 11/18/2014   Other abnormal blood chemistry 04/03/2007   Pain in joint, pelvic region and thigh 11/12/2011   Pain in joint, shoulder region 08/15/2006   Pain in limb 08/26/2005   Palpitations 09/29/2009   PAOD (peripheral arterial occlusive disease) (HCC) 11/18/2014   Right leg; diminished popliteal, dorsalis pedis, and posterior tibial artery pulsations    Postmenopausal atrophic vaginitis 12/14/2004   Scoliosis    Senile osteoporosis 12/14/2004   Tension headache 10/04/2005   Unspecified cataract 11/26/2004   Unspecified constipation 01/21/2005   Unspecified urinary incontinence  11/26/2004   Vaginal stenosis 11/18/2014   Tight band about 1/2 inches into the vagina which will not allow passage of my index finger.    Past Surgical History:  Procedure Laterality Date   ABDOMINAL HYSTERECTOMY  1969   Dr.Braun    bilateral eyelid surgery  2005    Dr.Scott   BILATERAL OOPHORECTOMY  2002   BREAST SURGERY     benign tumor removal, left breast 1969   CATARACT EXTRACTION     Right   CHOLECYSTECTOMY  1990   Dr.Moore    COLONOSCOPY  3/18/20008   inflammation, diverticular associated colitis -Dr. Christella Hartigan   COSMETIC SURGERY     ESOPHAGOGASTRODUODENOSCOPY N/A 01/24/2014   Procedure: ESOPHAGOGASTRODUODENOSCOPY (EGD);  Surgeon: Hilarie Fredrickson, MD;  Location: [facility] ENDOSCOPY;  Service: Endoscopy;  Laterality: N/A;   ESOPHAGOGASTRODUODENOSCOPY ENDOSCOPY  04/04/14   Dr. Christella Hartigan   EYE SURGERY     FOREIGN BODY REMOVAL N/A 01/24/2014   Procedure: FOREIGN BODY REMOVAL;  Surgeon: Hilarie Fredrickson, MD;  Location: [facility] ENDOSCOPY;  Service: Endoscopy;  Laterality: N/A;   HIP PINNING,CANNULATED  11/07/2011   Procedure: CANNULATED HIP PINNING;  Surgeon: Kerrin Champagne, MD;  Location: [facility] ORS;  Service: Orthopedics;  Laterality: Left;   ORIF FEMORAL NECK FRACTURE W/ DHS  11/07/2011   Dr.Nitka    TONSILLECTOMY     Dr.Joe Katrinka Blazing   TUBAL LIGATION     VAGINAL PROLAPSE REPAIR  2002   Dr.Horback     Allergies  Allergen Reactions   Brexpiprazole Swelling   Lamictal [Lamotrigine] Swelling    Lip swelling   Penicillins Other (See Comments)    CHILDHOOD REACTION   Abilify [Aripiprazole] Rash    Outpatient Encounter Medications as of 12/26/2022  Medication Sig   Acetylcysteine 600 MG CAPS Take 1 capsule by mouth daily.   antiseptic oral rinse (BIOTENE) LIQD 10 mLs by Mouth Rinse route as needed for dry mouth. Resident may self administer and keep in room   aspirin 81 MG chewable tablet Chew 81 mg by mouth daily.   Bismuth Subsalicylate (PEPTO-BISMOL PO) Take 30 mg by mouth as needed. For diarrhea   buPROPion (WELLBUTRIN XL) 300 MG 24 hr tablet Take 1 tablet (300 mg total) by mouth daily.   Calcium Carb-Cholecalciferol 600-800 MG-UNIT TABS Take 1 tablet by mouth 2 (two) times daily.   cholecalciferol (VITAMIN D) 1000 UNITS tablet Take 1,000 Units by mouth daily.    clonazePAM (KLONOPIN) 0.5 MG tablet Take 0.5 mg by mouth as needed for anxiety. Take 1/2 tablet up to 4 times daily as needed for anxiety.   docusate sodium (COLACE) 100 MG capsule Take 1 capsule (100 mg total) by mouth daily.   escitalopram (LEXAPRO) 20 MG tablet Take 1 tablet (20 mg total) by mouth daily.   Eyelid Cleansers (OCUSOFT LID SCRUB PLUS) PADS Place 1 each into both eyes daily.   GLUCOSAMINE HCL PO Take 2,000 mg by mouth 2 (two) times daily.   hydrocortisone 2.5 % ointment Apply 1 application topically 2 (two) times daily.   hypromellose (GENTEAL SEVERE) 0.3 % GEL ophthalmic ointment Place 1 Application into both eyes as needed for dry eyes.   hypromellose (GENTEAL SEVERE) 0.3 % GEL ophthalmic ointment Place 1 Application into both eyes in the morning, at noon, in the evening, and at bedtime.   LORazepam (ATIVAN) 0.5 MG tablet Take one tablet three times daily as needed for anxiety and take one extra tablet as needed for severe anxiety.   lubiprostone (  AMITIZA) 8 MCG capsule Take 8 mcg by mouth 2 (two) times daily with a meal.   meloxicam (MOBIC) 7.5 MG tablet TAKE 1 TABLET BY MOUTH EVERY DAY AS NEEDED FOR ARTHRITIS   Menthol, Topical Analgesic, 4 % GEL Apply 1 application  topically as needed. Resident may self administer and keep in room   miconazole (MICOTIN) 2 % powder Apply topically as needed for itching. apply to legs and feet once a day   mirtazapine (REMERON) 45 MG tablet Take 1 tablet (45 mg total) by mouth at bedtime.   Multiple Vitamin (MULTIVITAMIN WITH MINERALS) TABS Take 1 tablet by mouth daily.   Multiple Vitamins-Minerals (PRESERVISION AREDS 2+MULTI VIT PO) Take 1 tablet by mouth 2 (two) times daily.   NIFEdipine (PROCARDIA XL/ADALAT-CC) 60 MG 24 hr tablet TAKE 1 TABLET BY MOUTH EVERY DAY TO CONTROL BLOOD PRESSURE   pantoprazole (PROTONIX) 40 MG tablet One daily to reduce stomach acid   polyethylene glycol (MIRALAX / GLYCOLAX) 17 g packet Take 17 g by mouth daily. Mix  17g  with 8 oz. of fluid daily for constipation   pravastatin (PRAVACHOL) 10 MG tablet Take 10 mg by mouth daily.   No facility-administered encounter medications on file as of 12/26/2022.    Review of Systems  Constitutional:  Negative for activity change and appetite change.  Respiratory:  Negative for cough, shortness of breath and wheezing.   Cardiovascular:  Negative for chest pain and leg swelling.  Psychiatric/Behavioral:  Positive for dysphoric mood. Negative for confusion and sleep disturbance. The patient is nervous/anxious.     Immunization History  Administered Date(s) Administered   Fluad Quad(high Dose 65+) 03/09/2022   Influenza Whole 02/14/2012, 02/14/2013   Influenza, High Dose Seasonal PF 03/14/2017, 02/26/2019   Influenza-Unspecified 02/27/2014, 02/12/2015, 02/25/2016, 03/15/2017, 02/19/2018, 02/18/2020, 03/09/2021   Moderna SARS-COV2 Booster Vaccination 10/21/2020   Moderna Sars-Covid-2 Vaccination 05/20/2019, 06/17/2019, 03/30/2020, 03/22/2022   PFIZER(Purple Top)SARS-COV-2 Vaccination 02/03/2021   Pneumococcal Conjugate-13 07/13/2015   Pneumococcal Polysaccharide-23 05/16/1998   Td 05/16/2004   Zoster Recombinant(Shingrix) 02/20/2018, 05/11/2018, 07/16/2018   Zoster, Live 05/16/2005   Pertinent  Health Maintenance Due  Topic Date Due   INFLUENZA VACCINE  12/15/2022   DEXA SCAN  Completed   MAMMOGRAM  Discontinued      04/25/2018    2:43 PM 04/09/2019    1:22 PM 06/11/2021   11:40 AM 03/17/2022   11:51 AM 07/13/2022   11:31 AM  Fall Risk  Falls in the past year? 0 0 1 0 0  Was there an injury with Fall? 0  0 0 0  Fall Risk Category Calculator 0  1 0 0  Fall Risk Category (Retired) Low  Low Low   (RETIRED) Patient Fall Risk Level   Moderate fall risk Low fall risk   Patient at Risk for Falls Due to   History of fall(s);Impaired balance/gait;Impaired mobility;Impaired vision History of fall(s);Impaired balance/gait History of fall(s);Impaired  balance/gait;Impaired mobility  Fall risk Follow up   Falls evaluation completed;Education provided Falls evaluation completed Falls evaluation completed;Education provided;Falls prevention discussed   Functional Status Survey:    Vitals:   12/26/22 1018  BP: (!) 106/56  Pulse: 81  Resp: 18  Temp: 97.9 F (36.6 C)  SpO2: 91%  Weight: 140 lb 6.4 oz (63.7 kg)  Height: 5\' 2"  (1.575 m)   Body mass index is 25.68 kg/m. Physical Exam Vitals reviewed.  Constitutional:      General: She is not in acute distress. HENT:  Head: Normocephalic.  Cardiovascular:     Rate and Rhythm: Normal rate and regular rhythm.     Pulses: Normal pulses.     Heart sounds: Normal heart sounds.  Pulmonary:     Effort: Pulmonary effort is normal. No respiratory distress.     Breath sounds: Normal breath sounds. No wheezing.  Neurological:     General: No focal deficit present.     Mental Status: She is alert and oriented to person, place, and time.     Motor: Weakness present.     Gait: Gait abnormal.     Comments: rolator  Psychiatric:        Mood and Affect: Mood is anxious.     Labs reviewed: Recent Labs    06/13/22 0000  NA 137  K 4.6  CL 101  CO2 29*  BUN 21  CREATININE 0.9  CALCIUM 10.4   Recent Labs    06/13/22 0000  AST 16  ALT 10  ALKPHOS 70  ALBUMIN 4.3   Recent Labs    06/13/22 0000 08/18/22 0000  WBC 6.7 5.9  NEUTROABS  --  3,853.00  HGB 13.4 13.0  HCT 40 40  PLT 184 179   Lab Results  Component Value Date   TSH 2.10 03/08/2021   Lab Results  Component Value Date   HGBA1C 5.5 09/28/2018   Lab Results  Component Value Date   CHOL 165 06/13/2022   HDL 55 06/13/2022   LDLCALC 89 06/13/2022   TRIG 113 06/13/2022   CHOLHDL 3.5 08/04/2017    Significant Diagnostic Results in last 30 days:  No results found.  Assessment/Plan 1. GAD (generalized anxiety disorder) - ongoing - Buspar not recommended by psych> Restoril - reducing Wellbutrin not  recommended by psych - 08/09 hydroxyzine 10 mg po BID started> reports reduced anxiety today - will continue hydroxyzine 10 mg po BID for now - cont ativan and clonazepam prn- refuses to take clonazepam - consider repeat MMSE in future  - discussed deep breathing, calming music, aroma therapy and avoiding caffeine  2. Major depression, recurrent, chronic (HCC) - PHQ score 10> was 11 11/2022 - cont Lexapro, Wellbutrin and Remeron    Family/ staff Communication: plan discussed with patient and nurse  Labs/tests ordered:  none

## 2022-12-27 LAB — CBC AND DIFFERENTIAL
HCT: 37 (ref 36–46)
Hemoglobin: 12.2 (ref 12.0–16.0)
Neutrophils Absolute: 3864
Platelets: 188 10*3/uL (ref 150–400)
WBC: 5.3

## 2022-12-27 LAB — COMPREHENSIVE METABOLIC PANEL
Albumin: 4 (ref 3.5–5.0)
Calcium: 10 (ref 8.7–10.7)
Globulin: 2.6

## 2022-12-27 LAB — CBC: RBC: 4.05 (ref 3.87–5.11)

## 2022-12-27 LAB — BASIC METABOLIC PANEL
BUN: 24 — AB (ref 4–21)
CO2: 27 — AB (ref 13–22)
Chloride: 102 (ref 99–108)
Creatinine: 0.8 (ref 0.5–1.1)
Glucose: 88
Potassium: 4.1 meq/L (ref 3.5–5.1)
Sodium: 138 (ref 137–147)

## 2022-12-27 LAB — LIPID PANEL
Cholesterol: 139 (ref 0–200)
HDL: 50 (ref 35–70)
LDL Cholesterol: 70
Triglycerides: 106 (ref 40–160)

## 2022-12-27 LAB — HEPATIC FUNCTION PANEL
ALT: 10 U/L (ref 7–35)
AST: 14 (ref 13–35)
Alkaline Phosphatase: 72 (ref 25–125)

## 2023-01-09 ENCOUNTER — Other Ambulatory Visit: Payer: Self-pay | Admitting: Orthopedic Surgery

## 2023-01-09 DIAGNOSIS — F411 Generalized anxiety disorder: Secondary | ICD-10-CM

## 2023-01-09 MED ORDER — LORAZEPAM 0.5 MG PO TABS
ORAL_TABLET | ORAL | 0 refills | Status: DC
Start: 2023-01-09 — End: 2023-01-31

## 2023-01-10 DIAGNOSIS — F411 Generalized anxiety disorder: Secondary | ICD-10-CM | POA: Diagnosis not present

## 2023-01-18 DIAGNOSIS — H5201 Hypermetropia, right eye: Secondary | ICD-10-CM | POA: Diagnosis not present

## 2023-01-18 DIAGNOSIS — Z961 Presence of intraocular lens: Secondary | ICD-10-CM | POA: Diagnosis not present

## 2023-01-18 DIAGNOSIS — H353131 Nonexudative age-related macular degeneration, bilateral, early dry stage: Secondary | ICD-10-CM | POA: Diagnosis not present

## 2023-01-20 DIAGNOSIS — F809 Developmental disorder of speech and language, unspecified: Secondary | ICD-10-CM | POA: Diagnosis not present

## 2023-01-20 DIAGNOSIS — Y93C9 Activity, other involving computer technology and electronic devices: Secondary | ICD-10-CM | POA: Diagnosis not present

## 2023-01-20 DIAGNOSIS — H3530 Unspecified macular degeneration: Secondary | ICD-10-CM | POA: Diagnosis not present

## 2023-01-23 DIAGNOSIS — F809 Developmental disorder of speech and language, unspecified: Secondary | ICD-10-CM | POA: Diagnosis not present

## 2023-01-23 DIAGNOSIS — Y93C9 Activity, other involving computer technology and electronic devices: Secondary | ICD-10-CM | POA: Diagnosis not present

## 2023-01-23 DIAGNOSIS — H3530 Unspecified macular degeneration: Secondary | ICD-10-CM | POA: Diagnosis not present

## 2023-01-30 DIAGNOSIS — F809 Developmental disorder of speech and language, unspecified: Secondary | ICD-10-CM | POA: Diagnosis not present

## 2023-01-30 DIAGNOSIS — Y93C9 Activity, other involving computer technology and electronic devices: Secondary | ICD-10-CM | POA: Diagnosis not present

## 2023-01-30 DIAGNOSIS — H3530 Unspecified macular degeneration: Secondary | ICD-10-CM | POA: Diagnosis not present

## 2023-01-31 ENCOUNTER — Other Ambulatory Visit: Payer: Self-pay | Admitting: Adult Health

## 2023-01-31 DIAGNOSIS — F411 Generalized anxiety disorder: Secondary | ICD-10-CM

## 2023-01-31 MED ORDER — LORAZEPAM 0.5 MG PO TABS
0.5000 mg | ORAL_TABLET | Freq: Four times a day (QID) | ORAL | 0 refills | Status: AC | PRN
Start: 2023-01-31 — End: ?

## 2023-02-01 DIAGNOSIS — Y93C9 Activity, other involving computer technology and electronic devices: Secondary | ICD-10-CM | POA: Diagnosis not present

## 2023-02-01 DIAGNOSIS — F809 Developmental disorder of speech and language, unspecified: Secondary | ICD-10-CM | POA: Diagnosis not present

## 2023-02-01 DIAGNOSIS — H3530 Unspecified macular degeneration: Secondary | ICD-10-CM | POA: Diagnosis not present

## 2023-02-07 DIAGNOSIS — F411 Generalized anxiety disorder: Secondary | ICD-10-CM | POA: Diagnosis not present

## 2023-02-10 DIAGNOSIS — M79671 Pain in right foot: Secondary | ICD-10-CM | POA: Diagnosis not present

## 2023-02-10 DIAGNOSIS — M79672 Pain in left foot: Secondary | ICD-10-CM | POA: Diagnosis not present

## 2023-02-10 DIAGNOSIS — B351 Tinea unguium: Secondary | ICD-10-CM | POA: Diagnosis not present

## 2023-02-16 ENCOUNTER — Other Ambulatory Visit: Payer: Self-pay | Admitting: Internal Medicine

## 2023-02-16 ENCOUNTER — Other Ambulatory Visit: Payer: Self-pay | Admitting: Adult Health

## 2023-02-16 DIAGNOSIS — F411 Generalized anxiety disorder: Secondary | ICD-10-CM

## 2023-02-16 MED ORDER — LORAZEPAM 0.5 MG PO TABS
0.5000 mg | ORAL_TABLET | Freq: Four times a day (QID) | ORAL | 0 refills | Status: DC | PRN
Start: 2023-02-16 — End: 2023-03-20

## 2023-02-18 DIAGNOSIS — H3530 Unspecified macular degeneration: Secondary | ICD-10-CM | POA: Diagnosis not present

## 2023-02-18 DIAGNOSIS — Y93C9 Activity, other involving computer technology and electronic devices: Secondary | ICD-10-CM | POA: Diagnosis not present

## 2023-02-18 DIAGNOSIS — F809 Developmental disorder of speech and language, unspecified: Secondary | ICD-10-CM | POA: Diagnosis not present

## 2023-03-07 DIAGNOSIS — F411 Generalized anxiety disorder: Secondary | ICD-10-CM | POA: Diagnosis not present

## 2023-03-20 ENCOUNTER — Non-Acute Institutional Stay: Payer: Self-pay | Admitting: Orthopedic Surgery

## 2023-03-20 ENCOUNTER — Encounter: Payer: Self-pay | Admitting: Orthopedic Surgery

## 2023-03-20 DIAGNOSIS — M810 Age-related osteoporosis without current pathological fracture: Secondary | ICD-10-CM | POA: Diagnosis not present

## 2023-03-20 DIAGNOSIS — F339 Major depressive disorder, recurrent, unspecified: Secondary | ICD-10-CM

## 2023-03-20 DIAGNOSIS — F411 Generalized anxiety disorder: Secondary | ICD-10-CM | POA: Diagnosis not present

## 2023-03-20 DIAGNOSIS — E785 Hyperlipidemia, unspecified: Secondary | ICD-10-CM

## 2023-03-20 DIAGNOSIS — F429 Obsessive-compulsive disorder, unspecified: Secondary | ICD-10-CM

## 2023-03-20 DIAGNOSIS — M150 Primary generalized (osteo)arthritis: Secondary | ICD-10-CM

## 2023-03-20 DIAGNOSIS — K219 Gastro-esophageal reflux disease without esophagitis: Secondary | ICD-10-CM

## 2023-03-20 DIAGNOSIS — I10 Essential (primary) hypertension: Secondary | ICD-10-CM

## 2023-03-20 DIAGNOSIS — K5901 Slow transit constipation: Secondary | ICD-10-CM | POA: Diagnosis not present

## 2023-03-20 NOTE — Progress Notes (Signed)
Location:   [facility]  Nursing Home Room Number: 8-A Place of Service:  ALF [phone]) Provider:  Hazle Nordmann, NP  PCP: Mahlon Gammon, MD  Patient Care Team: Mahlon Gammon, MD as PCP - General (Internal Medicine) Shelva Majestic, [facility] Ernesto Rutherford, MD as Consulting Physician (Ophthalmology) Rachael Fee, MD as Consulting Physician (Gastroenterology) Archer Asa, MD as Consulting Physician (Psychiatry) Kerrin Champagne, MD (Inactive) as Consulting Physician (Orthopedic Surgery) Alfredo Martinez, MD as Consulting Physician (Urology)  Extended Emergency Contact Information Primary Emergency Contact: [facility] Phone: [phone] Relation: Son Secondary Emergency Contact: [address] [place], [place] [address] [name] of [place] Home Phone: [phone] Mobile Phone: [phone] Relation: Son  Code Status:  FULL CODE Goals of care: Advanced Directive information    03/20/2023    9:56 AM  Advanced Directives  Does Patient Have a Medical Advance Directive? Yes  Type of Estate agent of [place];Living will  Does patient want to make changes to medical advance directive? No - Patient declined  Copy of Healthcare Power of Attorney in Chart? Yes - validated most recent copy scanned in chart (See row information)     Chief Complaint  Patient presents with   Medical Management of Chronic Issues    Routine Visit.    Immunizations    Discuss the need for [employer].     HPI:  Pt is a 89 y.o. female seen today for medical management of chronic diseases.    She currently resides on the assisted living unit at [facility]. PMH: HTN, HLD, GAD, major depression, left breast surgery 1969, oophorectomy 2002, cholecystectomy, chronic back pain, spinal stenosis, OA, osteoporosis.    Depression/OCD/GAD- followed by psych(does not recommend Buspar or reducing Wellbutrin) - no further recommendations at this time, refusing  showering due to fear of falling, remains on lorazepam, Remeron, Wellbutrin, hydroxyzine prn and Lexapro,  Na+ 138 12/27/2022 HTN- BUN/creat 24/0.8 12/27/2022, remains on Procardia HLD- LDL 89 06/13/2022, remain on pravastatin GERD- hgb 12.2 12/27/2022, remains on Protonix OA- remains on biofreeze/tylenol/meloxicam Osteoporosis- DEXA 2018, t score -2.9, remains on calcium and vitamin D Constipation- remains on colace and Amitizia  No recent falls or injuries.   Recent blood pressures:  10/30- 131/69  10/23- 131/80  10/16- 118/62  Recent weights:  11/02- 143.6 lbs  10/01- 138.8 lbs  09/06- 143 lbs    Past Medical History:  Diagnosis Date   Abnormal mammogram, unspecified 04/19/2011   Allergy    Anxiety    Backache, unspecified 01/21/2005   Cervicalgia 08/20/2013   Chronic kidney disease, stage II (mild) 04/03/2009   Chronic rhinitis 09/28/2010   DDD (degenerative disc disease)    Depression    Dermatophytosis of nail 01/21/2005   Diverticulosis of colon (without mention of hemorrhage) 06/17/2005   Edema 11/26/2004   GERD (gastroesophageal reflux disease)    Hiatal hernia    High cholesterol    Hyperglycemia 11/18/2014   Hypertension    Insomnia, unspecified 10/18/2011   Left inguinal hernia 04/24/2014   Nocturia 09/19/2008   Osteoporosis, senile 11/18/2014   Other abnormal blood chemistry 04/03/2007   Pain in joint, pelvic region and thigh 11/12/2011   Pain in joint, shoulder region 08/15/2006   Pain in limb 08/26/2005   Palpitations 09/29/2009   PAOD (peripheral arterial occlusive disease) (HCC) 11/18/2014   Right leg; diminished popliteal, dorsalis pedis, and posterior tibial artery pulsations    Postmenopausal atrophic  vaginitis 12/14/2004   Scoliosis    Senile osteoporosis 12/14/2004   Tension headache 10/04/2005   Unspecified cataract 11/26/2004   Unspecified constipation 01/21/2005   Unspecified urinary incontinence 11/26/2004   Vaginal stenosis 11/18/2014   Tight band about 1/2 inches  into the vagina which will not allow passage of my index finger.    Past Surgical History:  Procedure Laterality Date   ABDOMINAL HYSTERECTOMY  1969   Dr.Braun    bilateral eyelid surgery  2005   Dr.Scott   BILATERAL OOPHORECTOMY  2002   BREAST SURGERY     benign tumor removal, left breast 1969   CATARACT EXTRACTION     Right   CHOLECYSTECTOMY  1990   Dr.Moore    COLONOSCOPY  3/18/20008   inflammation, diverticular associated colitis -Dr. Christella Hartigan   COSMETIC SURGERY     ESOPHAGOGASTRODUODENOSCOPY N/A 01/24/2014   Procedure: ESOPHAGOGASTRODUODENOSCOPY (EGD);  Surgeon: Hilarie Fredrickson, MD;  Location: [facility] ENDOSCOPY;  Service: Endoscopy;  Laterality: N/A;   ESOPHAGOGASTRODUODENOSCOPY ENDOSCOPY  04/04/14   Dr. Christella Hartigan   EYE SURGERY     FOREIGN BODY REMOVAL N/A 01/24/2014   Procedure: FOREIGN BODY REMOVAL;  Surgeon: Hilarie Fredrickson, MD;  Location: [facility] ENDOSCOPY;  Service: Endoscopy;  Laterality: N/A;   HIP PINNING,CANNULATED  11/07/2011   Procedure: CANNULATED HIP PINNING;  Surgeon: Kerrin Champagne, MD;  Location: [facility] ORS;  Service: Orthopedics;  Laterality: Left;   ORIF FEMORAL NECK FRACTURE W/ DHS  11/07/2011   Dr.Nitka    TONSILLECTOMY     Dr.Joe Katrinka Blazing   TUBAL LIGATION     VAGINAL PROLAPSE REPAIR  2002   Dr.Horback     Allergies  Allergen Reactions   Brexpiprazole Swelling   Lamictal [Lamotrigine] Swelling    Lip swelling   Penicillins Other (See Comments)    CHILDHOOD REACTION   Abilify [Aripiprazole] Rash    Allergies as of 03/20/2023       Reactions   Brexpiprazole Swelling   Lamictal [lamotrigine] Swelling   Lip swelling   Penicillins Other (See Comments)   CHILDHOOD REACTION   Abilify [aripiprazole] Rash        Medication List        Accurate as of March 20, 2023  9:57 AM. If you have any questions, ask your nurse or doctor.          STOP taking these medications    clonazePAM 0.5 MG tablet Commonly known as: KLONOPIN Stopped by: Octavia Heir       TAKE  these medications    Acetylcysteine 600 MG Caps Take 1 capsule by mouth daily.   antiseptic oral rinse Liqd 10 mLs by Mouth Rinse route as needed for dry mouth. Resident may self administer and keep in room   aspirin 81 MG chewable tablet Chew 81 mg by mouth daily.   buPROPion 150 MG 24 hr tablet Commonly known as: WELLBUTRIN XL Take 150 mg by mouth daily. What changed: Another medication with the same name was removed. Continue taking this medication, and follow the directions you see here. Changed by: Octavia Heir   Calcium Carb-Cholecalciferol 600-800 MG-UNIT Tabs Take 1 tablet by mouth 2 (two) times daily.   cholecalciferol 1000 units tablet Commonly known as: VITAMIN D Take 1,000 Units by mouth daily.   docusate sodium 100 MG capsule Commonly known as: Colace Take 1 capsule (100 mg total) by mouth daily.   DRY EYES OP Place 2 drops into both eyes in the morning, at  noon, in the evening, and at bedtime.   escitalopram 20 MG tablet Commonly known as: LEXAPRO Take 1 tablet (20 mg total) by mouth daily.   GenTeal Severe 0.3 % Gel ophthalmic ointment Generic drug: hypromellose Place 1 Application into both eyes as needed for dry eyes. What changed: Another medication with the same name was removed. Continue taking this medication, and follow the directions you see here. Changed by: Octavia Heir   GLUCOSAMINE HCL PO Take 2,000 mg by mouth 2 (two) times daily.   hydrocortisone 2.5 % ointment Apply 1 application  topically 2 (two) times daily as needed.   hydrOXYzine 10 MG tablet Commonly known as: ATARAX Take 1 tablet (10 mg total) by mouth 2 (two) times daily.   LORazepam 0.5 MG tablet Commonly known as: ATIVAN Take 0.5 mg by mouth as needed for anxiety. What changed: Another medication with the same name was removed. Continue taking this medication, and follow the directions you see here. Changed by: Octavia Heir   lubiprostone 8 MCG capsule Commonly known as:  AMITIZA Take 8 mcg by mouth 2 (two) times daily with a meal.   meloxicam 7.5 MG tablet Commonly known as: MOBIC TAKE 1 TABLET BY MOUTH EVERY DAY AS NEEDED FOR ARTHRITIS   Menthol (Topical Analgesic) 4 % Gel Apply 1 application  topically as needed. Resident may self administer and keep in room   miconazole 2 % powder Commonly known as: MICOTIN Apply topically as needed for itching. apply to legs and feet once a day   mirtazapine 45 MG tablet Commonly known as: REMERON Take 1 tablet (45 mg total) by mouth at bedtime.   multivitamin with minerals Tabs tablet Take 1 tablet by mouth daily.   NIFEdipine 60 MG 24 hr tablet Commonly known as: PROCARDIA XL/NIFEDICAL XL TAKE 1 TABLET BY MOUTH EVERY DAY TO CONTROL BLOOD PRESSURE   OcuSoft Lid Scrub Plus Pads Place 1 each into both eyes daily.   pantoprazole 40 MG tablet Commonly known as: PROTONIX One daily to reduce stomach acid   PEPTO-BISMOL PO Take 30 mg by mouth as needed. For diarrhea   polyethylene glycol 17 g packet Commonly known as: MIRALAX / GLYCOLAX Take 17 g by mouth daily. Mix 17g  with 8 oz. of fluid daily for constipation   pravastatin 10 MG tablet Commonly known as: PRAVACHOL Take 10 mg by mouth daily.   PRESERVISION AREDS 2+MULTI VIT PO Take 1 tablet by mouth 2 (two) times daily.        Review of Systems  Constitutional:  Negative for activity change and appetite change.  HENT:  Negative for sore throat and trouble swallowing.   Eyes:  Negative for visual disturbance.  Respiratory:  Negative for cough, shortness of breath and wheezing.   Cardiovascular:  Negative for chest pain and leg swelling.  Gastrointestinal:  Positive for constipation. Negative for abdominal distention and abdominal pain.  Genitourinary:  Negative for dysuria, frequency, hematuria and vaginal bleeding.  Musculoskeletal:  Positive for gait problem and joint swelling.  Skin:  Negative for wound.  Neurological:  Positive for  dizziness and weakness.  Psychiatric/Behavioral:  Positive for dysphoric mood. Negative for confusion and sleep disturbance. The patient is nervous/anxious.     Immunization History  Administered Date(s) Administered   Fluad Quad(high Dose 65+) 03/09/2022   Influenza Whole 02/14/2012, 02/14/2013   Influenza, High Dose Seasonal PF 03/14/2017, 02/26/2019, 03/14/2023   Influenza-Unspecified 02/27/2014, 02/12/2015, 02/25/2016, 03/15/2017, 02/19/2018, 02/18/2020, 03/09/2021   Moderna SARS-COV2  Booster Vaccination 10/21/2020   Moderna Sars-Covid-2 Vaccination 05/20/2019, 06/17/2019, 03/30/2020, 03/22/2022   PFIZER(Purple Top)SARS-COV-2 Vaccination 02/03/2021   Pneumococcal Conjugate-13 07/13/2015   Pneumococcal Polysaccharide-23 05/16/1998   Td 05/16/2004   Zoster Recombinant(Shingrix) 02/20/2018, 05/11/2018, 07/16/2018   Zoster, Live 05/16/2005   Pertinent  Health Maintenance Due  Topic Date Due   INFLUENZA VACCINE  Completed   DEXA SCAN  Completed   MAMMOGRAM  Discontinued      04/09/2019    1:22 PM 06/11/2021   11:40 AM 03/17/2022   11:51 AM 07/13/2022   11:31 AM 12/26/2022   10:48 AM  Fall Risk  Falls in the past year? 0 1 0 0 0  Was there an injury with Fall?  0 0 0 0  Fall Risk Category Calculator  1 0 0 0  Fall Risk Category (Retired)  Low Low    (RETIRED) Patient Fall Risk Level  Moderate fall risk Low fall risk    Patient at Risk for Falls Due to  History of fall(s);Impaired balance/gait;Impaired mobility;Impaired vision History of fall(s);Impaired balance/gait History of fall(s);Impaired balance/gait;Impaired mobility History of fall(s);Impaired balance/gait  Fall risk Follow up  Falls evaluation completed;Education provided Falls evaluation completed Falls evaluation completed;Education provided;Falls prevention discussed Falls evaluation completed;Education provided;Falls prevention discussed   Functional Status Survey:    Vitals:   03/20/23 0937  BP: 131/69  Pulse:  83  Resp: 18  Temp: 98.4 F (36.9 C)  SpO2: 96%  Weight: 143 lb 9.6 oz (65.1 kg)  Height: 5\' 2"  (1.575 m)   Body mass index is 26.26 kg/m. Physical Exam Vitals reviewed.  Constitutional:      General: She is not in acute distress. HENT:     Head: Normocephalic.     Right Ear: There is no impacted cerumen.     Left Ear: There is no impacted cerumen.     Nose: Nose normal.     Mouth/Throat:     Mouth: Mucous membranes are moist.  Eyes:     General:        Right eye: No discharge.        Left eye: No discharge.  Cardiovascular:     Rate and Rhythm: Normal rate and regular rhythm.     Pulses: Normal pulses.     Heart sounds: Murmur heard.  Pulmonary:     Effort: Pulmonary effort is normal. No respiratory distress.     Breath sounds: Normal breath sounds. No wheezing or rales.  Abdominal:     General: Bowel sounds are normal.     Palpations: Abdomen is soft.  Musculoskeletal:     Cervical back: Neck supple.     Right lower leg: No edema.     Left lower leg: No edema.  Skin:    General: Skin is warm.     Capillary Refill: Capillary refill takes less than 2 seconds.  Neurological:     General: No focal deficit present.     Mental Status: She is alert and oriented to person, place, and time.     Motor: Weakness present.     Gait: Gait abnormal.  Psychiatric:        Mood and Affect: Mood normal.     Labs reviewed: Recent Labs    06/13/22 0000 12/27/22 0000  NA 137 138  K 4.6 4.1  CL 101 102  CO2 29* 27*  BUN 21 24*  CREATININE 0.9 0.8  CALCIUM 10.4 10.0   Recent Labs    06/13/22 0000  12/27/22 0000  AST 16 14  ALT 10 10  ALKPHOS 70 72  ALBUMIN 4.3 4.0   Recent Labs    06/13/22 0000 08/18/22 0000 12/27/22 0000  WBC 6.7 5.9 5.3  NEUTROABS  --  3,853.00 3,864.00  HGB 13.4 13.0 12.2  HCT 40 40 37  PLT 184 179 188   Lab Results  Component Value Date   TSH 2.10 03/08/2021   Lab Results  Component Value Date   HGBA1C 5.5 09/28/2018   Lab  Results  Component Value Date   CHOL 139 12/27/2022   HDL 50 12/27/2022   LDLCALC 70 12/27/2022   TRIG 106 12/27/2022   CHOLHDL 3.5 08/04/2017    Significant Diagnostic Results in last 30 days:  No results found.  Assessment/Plan 1. Major depression, recurrent, chronic (HCC) - ongoing - PHQ score 9 - followed by [facility] - does not recommend reducing Wellbutrin - cont Wellbutrin and Remeron  2. Generalized anxiety disorder - ongoing - refusing showering - discussed showering at least once weekly  - buspar not recommended by psych - cont lorazepam and ativan  3. Obsessive-compulsive disorder, unspecified type - cont Lexapro  4. Primary hypertension - controlled - cont Procardia  5. Hyperlipidemia LDL goal <100 - LDL 70 - cont statin  6. Gastroesophageal reflux disease without esophagitis - hgb stable - cont pantoprazole  7. Primary osteoarthritis involving multiple joints - cont biofreeze, tylenol and meloxicam  8. Age-related osteoporosis without current pathological fracture - cont vitamin D and calcium - last DEXA 2018, t score -2.9 - refusing future study at this time  9. Slow transit constipation - cont colace and Set designer Communication: plan discussed with patient and nurse  Labs/tests ordered: none

## 2023-03-22 DIAGNOSIS — Z23 Encounter for immunization: Secondary | ICD-10-CM | POA: Diagnosis not present

## 2023-03-29 ENCOUNTER — Encounter: Payer: Self-pay | Admitting: Psychiatry

## 2023-04-03 ENCOUNTER — Other Ambulatory Visit: Payer: Self-pay | Admitting: Orthopedic Surgery

## 2023-04-03 DIAGNOSIS — F411 Generalized anxiety disorder: Secondary | ICD-10-CM

## 2023-04-03 MED ORDER — LORAZEPAM 0.5 MG PO TABS: 0.5000 mg | ORAL_TABLET | Freq: Four times a day (QID) | ORAL | 0 refills | Status: DC | PRN

## 2023-04-11 ENCOUNTER — Other Ambulatory Visit: Payer: Self-pay | Admitting: Adult Health

## 2023-04-11 DIAGNOSIS — F411 Generalized anxiety disorder: Secondary | ICD-10-CM

## 2023-04-11 MED ORDER — LORAZEPAM 0.5 MG PO TABS: 0.5000 mg | ORAL_TABLET | Freq: Every day | ORAL | 0 refills | Status: DC | PRN

## 2023-04-12 ENCOUNTER — Other Ambulatory Visit: Payer: Self-pay | Admitting: Orthopedic Surgery

## 2023-04-12 DIAGNOSIS — F411 Generalized anxiety disorder: Secondary | ICD-10-CM

## 2023-04-12 MED ORDER — LORAZEPAM 0.5 MG PO TABS: 0.5000 mg | ORAL_TABLET | Freq: Four times a day (QID) | ORAL | 0 refills | Status: DC | PRN

## 2023-04-20 ENCOUNTER — Other Ambulatory Visit: Payer: Self-pay | Admitting: Internal Medicine

## 2023-04-20 DIAGNOSIS — F411 Generalized anxiety disorder: Secondary | ICD-10-CM

## 2023-04-20 MED ORDER — LORAZEPAM 0.5 MG PO TABS: 0.5000 mg | ORAL_TABLET | Freq: Four times a day (QID) | ORAL | 0 refills | Status: DC

## 2023-06-06 DIAGNOSIS — B351 Tinea unguium: Secondary | ICD-10-CM | POA: Diagnosis not present

## 2023-06-06 DIAGNOSIS — M79671 Pain in right foot: Secondary | ICD-10-CM | POA: Diagnosis not present

## 2023-06-06 DIAGNOSIS — M79672 Pain in left foot: Secondary | ICD-10-CM | POA: Diagnosis not present

## 2023-06-08 ENCOUNTER — Other Ambulatory Visit: Payer: Self-pay | Admitting: Internal Medicine

## 2023-06-08 DIAGNOSIS — F411 Generalized anxiety disorder: Secondary | ICD-10-CM

## 2023-06-08 MED ORDER — LORAZEPAM 0.5 MG PO TABS: 0.5000 mg | ORAL_TABLET | Freq: Four times a day (QID) | ORAL | 0 refills | Status: DC

## 2023-07-10 ENCOUNTER — Other Ambulatory Visit: Payer: Self-pay | Admitting: Orthopedic Surgery

## 2023-07-10 DIAGNOSIS — F411 Generalized anxiety disorder: Secondary | ICD-10-CM

## 2023-07-10 MED ORDER — LORAZEPAM 0.5 MG PO TABS
0.5000 mg | ORAL_TABLET | Freq: Four times a day (QID) | ORAL | 0 refills | Status: DC
Start: 2023-07-10 — End: 2023-08-08

## 2023-07-21 ENCOUNTER — Encounter: Payer: Self-pay | Admitting: Orthopedic Surgery

## 2023-07-21 ENCOUNTER — Non-Acute Institutional Stay: Payer: Self-pay | Admitting: Orthopedic Surgery

## 2023-07-21 DIAGNOSIS — D280 Benign neoplasm of vulva: Secondary | ICD-10-CM | POA: Diagnosis not present

## 2023-07-21 NOTE — Progress Notes (Signed)
 Location:  [facility] Room Number: 8/A Place of Service:  ALF [phone]) Provider:  Octavia Heir, NP   Mahlon Gammon, MD  Tina Hampton Care Team: Mahlon Gammon, MD as PCP - General (Internal Medicine) Shelva Majestic, [facility] Ernesto Rutherford, MD as Consulting Physician (Ophthalmology) Rachael Fee, MD (Inactive) as Consulting Physician (Gastroenterology) Archer Asa, MD as Consulting Physician (Psychiatry) Kerrin Champagne, MD (Inactive) as Consulting Physician (Orthopedic Surgery) Alfredo Martinez, MD as Consulting Physician (Urology)  Extended Emergency Contact Information Primary Emergency Contact: [name] Phone: [phone] Relation: Son Secondary Emergency Contact: [address] [name], [place] [address] [name] of [place] Home Phone: [phone] Mobile Phone: [phone] Relation: Son  Code Status:  DNR Goals of care: Advanced Directive information    03/20/2023    9:56 AM  Advanced Directives  Does Tina Hampton Have a Medical Advance Directive? Yes  Type of Estate agent of [place];Living will  Does Tina Hampton want to make changes to medical advance directive? No - Tina Hampton declined  Copy of Healthcare Power of Attorney in Chart? Yes - validated most recent copy scanned in chart (See row information)     Chief Complaint  Tina Hampton presents with   Acute Visit    Vaginal lesion    HPI:  Pt is a 89 y.o. female seen today for acute visit due to vaginal lesion.   She currently resides on the assisted living unit at [facility]. PMH: HTN, HLD, GAD, major depression, left breast surgery 1969, oophorectomy 2002, cholecystectomy, chronic back pain, spinal stenosis, OA, osteoporosis.   H/o mild skin irritation to peri area within past week. She is incontinent and uses briefs/pads. Staff gave her zinc oxide cream for protection. She now reports tiny non tender bump to inner vaginal fold. She tried applying warm  compress to area x 2 days. She could not tell any difference in size. She has a h/o increased anxiety and requesting provider examine area. Afebrile. Vitals stable.       Past Medical History:  Diagnosis Date   Abnormal mammogram, unspecified 04/19/2011   Allergy    Anxiety    Backache, unspecified 01/21/2005   Cervicalgia 08/20/2013   Chronic kidney disease, stage II (mild) 04/03/2009   Chronic rhinitis 09/28/2010   DDD (degenerative disc disease)    Depression    Dermatophytosis of nail 01/21/2005   Diverticulosis of colon (without mention of hemorrhage) 06/17/2005   Edema 11/26/2004   GERD (gastroesophageal reflux disease)    Hiatal hernia    High cholesterol    Hyperglycemia 11/18/2014   Hypertension    Insomnia, unspecified 10/18/2011   Left inguinal hernia 04/24/2014   Nocturia 09/19/2008   Osteoporosis, senile 11/18/2014   Other abnormal blood chemistry 04/03/2007   Pain in joint, pelvic region and thigh 11/12/2011   Pain in joint, shoulder region 08/15/2006   Pain in limb 08/26/2005   Palpitations 09/29/2009   PAOD (peripheral arterial occlusive disease) (HCC) 11/18/2014   Right leg; diminished popliteal, dorsalis pedis, and posterior tibial artery pulsations    Postmenopausal atrophic vaginitis 12/14/2004   Scoliosis    Senile osteoporosis 12/14/2004   Tension headache 10/04/2005   Unspecified cataract 11/26/2004   Unspecified constipation 01/21/2005   Unspecified urinary incontinence 11/26/2004   Vaginal stenosis 11/18/2014   Tight band about 1/2 inches into the vagina which will not allow passage of my index finger.    Past Surgical History:  Procedure  Laterality Date   ABDOMINAL HYSTERECTOMY  1969   Dr.Braun    bilateral eyelid surgery  2005   Dr.Scott   BILATERAL OOPHORECTOMY  2002   BREAST SURGERY     benign tumor removal, left breast 1969   CATARACT EXTRACTION     Right   CHOLECYSTECTOMY  1990   Dr.Moore    COLONOSCOPY  3/18/20008   inflammation, diverticular associated colitis  -Dr. Christella Hartigan   COSMETIC SURGERY     ESOPHAGOGASTRODUODENOSCOPY N/A 01/24/2014   Procedure: ESOPHAGOGASTRODUODENOSCOPY (EGD);  Surgeon: Hilarie Fredrickson, MD;  Location: [facility] ENDOSCOPY;  Service: Endoscopy;  Laterality: N/A;   ESOPHAGOGASTRODUODENOSCOPY ENDOSCOPY  04/04/14   Dr. Christella Hartigan   EYE SURGERY     FOREIGN BODY REMOVAL N/A 01/24/2014   Procedure: FOREIGN BODY REMOVAL;  Surgeon: Hilarie Fredrickson, MD;  Location: [facility] ENDOSCOPY;  Service: Endoscopy;  Laterality: N/A;   HIP PINNING,CANNULATED  11/07/2011   Procedure: CANNULATED HIP PINNING;  Surgeon: Kerrin Champagne, MD;  Location: [facility] ORS;  Service: Orthopedics;  Laterality: Left;   ORIF FEMORAL NECK FRACTURE W/ DHS  11/07/2011   Dr.Nitka    TONSILLECTOMY     Dr.Joe Katrinka Blazing   TUBAL LIGATION     VAGINAL PROLAPSE REPAIR  2002   Dr.Horback     Allergies  Allergen Reactions   Brexpiprazole Swelling   Lamictal [Lamotrigine] Swelling    Lip swelling   Penicillins Other (See Comments)    CHILDHOOD REACTION   Abilify [Aripiprazole] Rash    Outpatient Encounter Medications as of 07/21/2023  Medication Sig   Acetylcysteine 600 MG CAPS Take 1 capsule by mouth daily.   antiseptic oral rinse (BIOTENE) LIQD 10 mLs by Mouth Rinse route as needed for dry mouth. Resident may self administer and keep in room   Artificial Tear Ointment (DRY EYES OP) Place 2 drops into both eyes in the morning, at noon, in the evening, and at bedtime.   aspirin 81 MG chewable tablet Chew 81 mg by mouth daily.   Bismuth Subsalicylate (PEPTO-BISMOL PO) Take 30 mg by mouth as needed. For diarrhea   buPROPion (WELLBUTRIN XL) 150 MG 24 hr tablet Take 150 mg by mouth daily.   Calcium Carb-Cholecalciferol 600-800 MG-UNIT TABS Take 1 tablet by mouth 2 (two) times daily.   cholecalciferol (VITAMIN D) 1000 UNITS tablet Take 1,000 Units by mouth daily.   docusate sodium (COLACE) 100 MG capsule Take 1 capsule (100 mg total) by mouth daily.   escitalopram (LEXAPRO) 20 MG tablet Take 1 tablet (20  mg total) by mouth daily.   Eyelid Cleansers (OCUSOFT LID SCRUB PLUS) PADS Place 1 each into both eyes daily.   GLUCOSAMINE HCL PO Take 2,000 mg by mouth 2 (two) times daily.   hydrocortisone 2.5 % ointment Apply 1 application  topically 2 (two) times daily as needed.   hydrOXYzine (ATARAX) 10 MG tablet Take 1 tablet (10 mg total) by mouth 2 (two) times daily.   hypromellose (GENTEAL SEVERE) 0.3 % GEL ophthalmic ointment Place 1 Application into both eyes as needed for dry eyes.   LORazepam (ATIVAN) 0.5 MG tablet Take 1 tablet (0.5 mg total) by mouth in the morning, at noon, in the evening, and at bedtime.   lubiprostone (AMITIZA) 8 MCG capsule Take 8 mcg by mouth 2 (two) times daily with a meal.   meloxicam (MOBIC) 7.5 MG tablet TAKE 1 TABLET BY MOUTH EVERY DAY AS NEEDED FOR ARTHRITIS   Menthol, Topical Analgesic, 4 % GEL Apply 1 application  topically as needed. Resident may self administer and keep in room   miconazole (MICOTIN) 2 % powder Apply topically as needed for itching. apply to legs and feet once a day   mirtazapine (REMERON) 45 MG tablet Take 1 tablet (45 mg total) by mouth at bedtime.   Multiple Vitamin (MULTIVITAMIN WITH MINERALS) TABS Take 1 tablet by mouth daily.   Multiple Vitamins-Minerals (PRESERVISION AREDS 2+MULTI VIT PO) Take 1 tablet by mouth 2 (two) times daily.   NIFEdipine (PROCARDIA XL/ADALAT-CC) 60 MG 24 hr tablet TAKE 1 TABLET BY MOUTH EVERY DAY TO CONTROL BLOOD PRESSURE   pantoprazole (PROTONIX) 40 MG tablet One daily to reduce stomach acid   polyethylene glycol (MIRALAX / GLYCOLAX) 17 g packet Take 17 g by mouth daily. Mix 17g  with 8 oz. of fluid daily for constipation   pravastatin (PRAVACHOL) 10 MG tablet Take 10 mg by mouth daily.   No facility-administered encounter medications on file as of 07/21/2023.    Review of Systems  Constitutional:  Negative for activity change and appetite change.  Respiratory:  Negative for shortness of breath.   Cardiovascular:   Negative for chest pain.  Genitourinary:        Vaginal lesion  Psychiatric/Behavioral:  Positive for dysphoric mood. The Tina Hampton is nervous/anxious.     Immunization History  Administered Date(s) Administered   Fluad Quad(high Dose 65+) 03/09/2022   Influenza Whole 02/14/2012, 02/14/2013   Influenza, High Dose Seasonal PF 03/14/2017, 02/26/2019, 03/14/2023   Influenza-Unspecified 02/27/2014, 02/12/2015, 02/25/2016, 03/15/2017, 02/19/2018, 02/18/2020, 03/09/2021   Moderna SARS-COV2 Booster Vaccination 10/21/2020   Moderna Sars-Covid-2 Vaccination 05/20/2019, 06/17/2019, 03/30/2020, 03/22/2022   PFIZER(Purple Top)SARS-COV-2 Vaccination 02/03/2021   Pneumococcal Conjugate-13 07/13/2015   Pneumococcal Polysaccharide-23 05/16/1998   Td 05/16/2004   Zoster Recombinant(Shingrix) 02/20/2018, 05/11/2018, 07/16/2018   Zoster, Live 05/16/2005   Pertinent  Health Maintenance Due  Topic Date Due   INFLUENZA VACCINE  Completed   DEXA SCAN  Completed   MAMMOGRAM  Discontinued      06/11/2021   11:40 AM 03/17/2022   11:51 AM 07/13/2022   11:31 AM 12/26/2022   10:48 AM 03/20/2023   12:58 PM  Fall Risk  Falls in the past year? 1 0 0 0 0  Was there an injury with Fall? 0 0 0 0 0  Fall Risk Category Calculator 1 0 0 0 0  Fall Risk Category (Retired) Low Low     (RETIRED) Tina Hampton Fall Risk Level Moderate fall risk Low fall risk     Tina Hampton at Risk for Falls Due to History of fall(s);Impaired balance/gait;Impaired mobility;Impaired vision History of fall(s);Impaired balance/gait History of fall(s);Impaired balance/gait;Impaired mobility History of fall(s);Impaired balance/gait History of fall(s);Impaired balance/gait;Impaired mobility  Fall risk Follow up Falls evaluation completed;Education provided Falls evaluation completed Falls evaluation completed;Education provided;Falls prevention discussed Falls evaluation completed;Education provided;Falls prevention discussed Falls evaluation  completed;Education provided;Falls prevention discussed   Functional Status Survey:    Vitals:   07/21/23 1223  BP: 120/72  Pulse: 79  Resp: 19  Temp: (!) 97.2 F (36.2 C)  SpO2: 93%  Weight: 144 lb 6.4 oz (65.5 kg)  Height: 5\' 2"  (1.575 m)   Body mass index is 26.41 kg/m. Physical Exam Vitals reviewed. Exam conducted with a chaperone present.  Constitutional:      General: She is not in acute distress. HENT:     Head: Normocephalic.  Eyes:     General:        Right eye: No discharge.  Left eye: No discharge.  Cardiovascular:     Rate and Rhythm: Normal rate and regular rhythm.     Pulses: Normal pulses.     Heart sounds: Normal heart sounds.  Pulmonary:     Effort: Pulmonary effort is normal.     Breath sounds: Normal breath sounds.  Genitourinary:    Labia:        Right: No rash, tenderness or lesion.        Left: Lesion present. No rash or tenderness.      Comments: Tiny, raised, non tender lesion to left inner vaginal fold, no skin breakdown/swelling/erythema.  Neurological:     General: No focal deficit present.     Mental Status: She is alert and oriented to person, place, and time.  Psychiatric:        Mood and Affect: Mood is anxious.     Labs reviewed: Recent Labs    12/27/22 0000  NA 138  K 4.1  CL 102  CO2 27*  BUN 24*  CREATININE 0.8  CALCIUM 10.0   Recent Labs    12/27/22 0000  AST 14  ALT 10  ALKPHOS 72  ALBUMIN 4.0   Recent Labs    08/18/22 0000 12/27/22 0000  WBC 5.9 5.3  NEUTROABS 3,853.00 3,864.00  HGB 13.0 12.2  HCT 40 37  PLT 179 188   Lab Results  Component Value Date   TSH 2.10 03/08/2021   Lab Results  Component Value Date   HGBA1C 5.5 09/28/2018   Lab Results  Component Value Date   CHOL 139 12/27/2022   HDL 50 12/27/2022   LDLCALC 70 12/27/2022   TRIG 106 12/27/2022   CHOLHDL 3.5 08/04/2017    Significant Diagnostic Results in last 30 days:  No results found.  Assessment/Plan 1.  Vestibular papillomatosis (Primary) - tiny raised lesion to left inner vaginal fold - no erythema/swelling/tenderness - do not suspect rash from zinc oxide - discussed benign finding - recommend frequent pad changing, good hygiene - contact nurse/PCP if lesion changes in size or pain occurs    Family/ staff Communication: plan discussed with Tina Hampton and nurse  Labs/tests ordered:  none

## 2023-07-27 ENCOUNTER — Encounter: Payer: Self-pay | Admitting: Internal Medicine

## 2023-07-27 ENCOUNTER — Non-Acute Institutional Stay: Payer: Self-pay | Admitting: Internal Medicine

## 2023-07-27 DIAGNOSIS — F411 Generalized anxiety disorder: Secondary | ICD-10-CM | POA: Diagnosis not present

## 2023-07-27 DIAGNOSIS — K219 Gastro-esophageal reflux disease without esophagitis: Secondary | ICD-10-CM | POA: Diagnosis not present

## 2023-07-27 DIAGNOSIS — E785 Hyperlipidemia, unspecified: Secondary | ICD-10-CM

## 2023-07-27 DIAGNOSIS — R42 Dizziness and giddiness: Secondary | ICD-10-CM

## 2023-07-27 DIAGNOSIS — F429 Obsessive-compulsive disorder, unspecified: Secondary | ICD-10-CM | POA: Diagnosis not present

## 2023-07-27 DIAGNOSIS — K5901 Slow transit constipation: Secondary | ICD-10-CM

## 2023-07-27 DIAGNOSIS — I10 Essential (primary) hypertension: Secondary | ICD-10-CM

## 2023-07-27 DIAGNOSIS — M150 Primary generalized (osteo)arthritis: Secondary | ICD-10-CM

## 2023-07-27 NOTE — Progress Notes (Unsigned)
 [facility] Service:  ALF (13)  Provider:   Code Status: DNR Goals of Care:     03/20/2023    9:56 AM  Advanced Directives  Does Patient Have a Medical Advance Directive? Yes  Type of Estate agent of [place];Living will  Does patient want to make changes to medical advance directive? No - Patient declined  Copy of Healthcare Power of Attorney in Chart? Yes - validated most recent copy scanned in chart (See row information)     Chief Complaint  Patient presents with   Care Management    HPI: Patient is a 89 y.o. female seen today for medical management of chronic diseases.    Lives in AL   Patient has h/o Major Depression with Anxiety, Hyperlipidemia, Chronic Bilateral Low Back Pain, Spinal Stenosis , Osteoarthritis, history of osteoporosis, hypertension Dizziness Per Neurology No more work up  The [employer] to struggle with Anxiety But overall staying stable No New issue Wanted me to check a small cyst in her Vaginal area Does follow with Psychiatrist Stay in her Room due to anxiety No Behaviors Cognitively does well Wt Readings from Last 3 Encounters:  07/27/23 144 lb (65.3 kg)  07/21/23 144 lb 6.4 oz (65.5 kg)  03/20/23 143 lb 9.6 oz (65.1 kg)     Past Medical History:  Diagnosis Date   Abnormal mammogram, unspecified 04/19/2011   Allergy    Anxiety    Backache, unspecified 01/21/2005   Cervicalgia 08/20/2013   Chronic kidney disease, stage II (mild) 04/03/2009   Chronic rhinitis 09/28/2010   DDD (degenerative disc disease)    Depression    Dermatophytosis of nail 01/21/2005   Diverticulosis of colon (without mention of hemorrhage) 06/17/2005   Edema 11/26/2004   GERD (gastroesophageal reflux disease)    Hiatal hernia    High cholesterol    Hyperglycemia 11/18/2014   Hypertension    Insomnia, unspecified 10/18/2011   Left inguinal hernia 04/24/2014   Nocturia 09/19/2008   Osteoporosis, senile 11/18/2014   Other  abnormal blood chemistry 04/03/2007   Pain in joint, pelvic region and thigh 11/12/2011   Pain in joint, shoulder region 08/15/2006   Pain in limb 08/26/2005   Palpitations 09/29/2009   PAOD (peripheral arterial occlusive disease) (HCC) 11/18/2014   Right leg; diminished popliteal, dorsalis pedis, and posterior tibial artery pulsations    Postmenopausal atrophic vaginitis 12/14/2004   Scoliosis    Senile osteoporosis 12/14/2004   Tension headache 10/04/2005   Unspecified cataract 11/26/2004   Unspecified constipation 01/21/2005   Unspecified urinary incontinence 11/26/2004   Vaginal stenosis 11/18/2014   Tight band about 1/2 inches into the vagina which will not allow passage of my index finger.     Past Surgical History:  Procedure Laterality Date   ABDOMINAL HYSTERECTOMY  1969   Dr.Braun    bilateral eyelid surgery  2005   Dr.Scott   BILATERAL OOPHORECTOMY  2002   BREAST SURGERY     benign tumor removal, left breast 1969   CATARACT EXTRACTION     Right   CHOLECYSTECTOMY  1990   Dr.Moore    COLONOSCOPY  3/18/20008   inflammation, diverticular associated colitis -Dr. Christella Hartigan   COSMETIC SURGERY     ESOPHAGOGASTRODUODENOSCOPY N/A 01/24/2014   Procedure: ESOPHAGOGASTRODUODENOSCOPY (EGD);  Surgeon: Hilarie Fredrickson, MD;  Location: [facility] ENDOSCOPY;  Service: Endoscopy;  Laterality: N/A;   ESOPHAGOGASTRODUODENOSCOPY ENDOSCOPY  04/04/14   Dr. Christella Hartigan   EYE SURGERY  FOREIGN BODY REMOVAL N/A 01/24/2014   Procedure: FOREIGN BODY REMOVAL;  Surgeon: Hilarie Fredrickson, MD;  Location: [facility] ENDOSCOPY;  Service: Endoscopy;  Laterality: N/A;   HIP PINNING,CANNULATED  11/07/2011   Procedure: CANNULATED HIP PINNING;  Surgeon: Kerrin Champagne, MD;  Location: [facility] ORS;  Service: Orthopedics;  Laterality: Left;   ORIF FEMORAL NECK FRACTURE W/ DHS  11/07/2011   Dr.Nitka    TONSILLECTOMY     Dr.Joe Katrinka Blazing   TUBAL LIGATION     VAGINAL PROLAPSE REPAIR  2002   Dr.Horback     Allergies  Allergen Reactions   Brexpiprazole  Swelling   Lamictal [Lamotrigine] Swelling    Lip swelling   Penicillins Other (See Comments)    CHILDHOOD REACTION   Abilify [Aripiprazole] Rash    Outpatient Encounter Medications as of 07/27/2023  Medication Sig   Acetylcysteine 600 MG CAPS Take 1 capsule by mouth daily.   antiseptic oral rinse (BIOTENE) LIQD 10 mLs by Mouth Rinse route as needed for dry mouth. Resident may self administer and keep in room   Artificial Tear Ointment (DRY EYES OP) Place 2 drops into both eyes in the morning, at noon, in the evening, and at bedtime.   aspirin 81 MG chewable tablet Chew 81 mg by mouth daily.   Bismuth Subsalicylate (PEPTO-BISMOL PO) Take 30 mg by mouth as needed. For diarrhea   buPROPion (WELLBUTRIN XL) 150 MG 24 hr tablet Take 150 mg by mouth daily.   Calcium Carb-Cholecalciferol 600-800 MG-UNIT TABS Take 1 tablet by mouth 2 (two) times daily.   cholecalciferol (VITAMIN D) 1000 UNITS tablet Take 1,000 Units by mouth daily.   docusate sodium (COLACE) 100 MG capsule Take 1 capsule (100 mg total) by mouth daily.   escitalopram (LEXAPRO) 20 MG tablet Take 1 tablet (20 mg total) by mouth daily.   Eyelid Cleansers (OCUSOFT LID SCRUB PLUS) PADS Place 1 each into both eyes daily.   GLUCOSAMINE HCL PO Take 2,000 mg by mouth 2 (two) times daily.   hydrocortisone 2.5 % ointment Apply 1 application  topically 2 (two) times daily as needed.   hydrOXYzine (ATARAX) 10 MG tablet Take 1 tablet (10 mg total) by mouth 2 (two) times daily.   hypromellose (GENTEAL SEVERE) 0.3 % GEL ophthalmic ointment Place 1 Application into both eyes as needed for dry eyes.   LORazepam (ATIVAN) 0.5 MG tablet Take 1 tablet (0.5 mg total) by mouth in the morning, at noon, in the evening, and at bedtime.   lubiprostone (AMITIZA) 8 MCG capsule Take 8 mcg by mouth 2 (two) times daily with a meal.   Menthol, Topical Analgesic, 4 % GEL Apply 1 application  topically as needed. Resident may self administer and keep in room    miconazole (MICOTIN) 2 % powder Apply topically as needed for itching. apply to legs and feet once a day   mirtazapine (REMERON) 45 MG tablet Take 1 tablet (45 mg total) by mouth at bedtime.   Multiple Vitamin (MULTIVITAMIN WITH MINERALS) TABS Take 1 tablet by mouth daily.   Multiple Vitamins-Minerals (PRESERVISION AREDS 2+MULTI VIT PO) Take 1 tablet by mouth 2 (two) times daily.   NIFEdipine (PROCARDIA XL/ADALAT-CC) 60 MG 24 hr tablet TAKE 1 TABLET BY MOUTH EVERY DAY TO CONTROL BLOOD PRESSURE   pantoprazole (PROTONIX) 40 MG tablet One daily to reduce stomach acid   polyethylene glycol (MIRALAX / GLYCOLAX) 17 g packet Take 17 g by mouth daily. Mix 17g  with 8 oz. of fluid daily  for constipation   pravastatin (PRAVACHOL) 10 MG tablet Take 10 mg by mouth daily.   [DISCONTINUED] meloxicam (MOBIC) 7.5 MG tablet TAKE 1 TABLET BY MOUTH EVERY DAY AS NEEDED FOR ARTHRITIS   No facility-administered encounter medications on file as of 07/27/2023.    Review of Systems:  Review of Systems  Constitutional:  Negative for activity change and appetite change.  HENT: Negative.    Respiratory:  Negative for cough and shortness of breath.   Cardiovascular:  Negative for leg swelling.  Gastrointestinal:  Positive for constipation.  Genitourinary: Negative.   Musculoskeletal:  Positive for gait problem. Negative for arthralgias and myalgias.  Skin: Negative.   Neurological:  Negative for dizziness and weakness.  Psychiatric/Behavioral:  Positive for dysphoric mood. Negative for confusion and sleep disturbance. The patient is nervous/anxious.     Health Maintenance  Topic Date Due   COVID-19 Vaccine (7 - 2024-25 season) 01/15/2023   Medicare Annual Wellness (AWV)  07/14/2023   Pneumonia Vaccine 27+ Years old  Completed   INFLUENZA VACCINE  Completed   DEXA SCAN  Completed   Zoster Vaccines- Shingrix  Completed   HPV VACCINES  Aged Out   DTaP/Tdap/Td  Discontinued   MAMMOGRAM  Discontinued     Physical Exam: Vitals:   07/27/23 1535  BP: (!) 134/90  Pulse: (!) 101  Resp: (!) 24  Temp: 98.1 F (36.7 C)  Weight: 144 lb (65.3 kg)   Body mass index is 26.34 kg/m. Physical Exam Vitals reviewed.  Constitutional:      Appearance: Normal appearance.  HENT:     Head: Normocephalic.     Nose: Nose normal.     Mouth/Throat:     Mouth: Mucous membranes are moist.     Pharynx: Oropharynx is clear.  Eyes:     Pupils: Pupils are equal, round, and reactive to light.  Cardiovascular:     Rate and Rhythm: Normal rate and regular rhythm.     Pulses: Normal pulses.     Heart sounds: Murmur heard.  Pulmonary:     Effort: Pulmonary effort is normal.     Breath sounds: Normal breath sounds.  Abdominal:     General: Abdomen is flat. Bowel sounds are normal.     Palpations: Abdomen is soft.  Genitourinary:    Comments: Small Cyst in Inner Labial Folds on Left Side Musculoskeletal:        General: No swelling.     Cervical back: Neck supple.  Skin:    General: Skin is warm.  Neurological:     General: No focal deficit present.     Mental Status: She is alert and oriented to person, place, and time.  Psychiatric:        Mood and Affect: Mood normal.        Thought Content: Thought content normal.     Labs reviewed: Basic Metabolic Panel: Recent Labs    12/27/22 0000  NA 138  K 4.1  CL 102  CO2 27*  BUN 24*  CREATININE 0.8  CALCIUM 10.0   Liver Function Tests: Recent Labs    12/27/22 0000  AST 14  ALT 10  ALKPHOS 72  ALBUMIN 4.0   No results for input(s): "LIPASE", "AMYLASE" in the last 8760 hours. No results for input(s): "AMMONIA" in the last 8760 hours. CBC: Recent Labs    08/18/22 0000 12/27/22 0000  WBC 5.9 5.3  NEUTROABS 3,853.00 3,864.00  HGB 13.0 12.2  HCT 40 37  PLT 179  188   Lipid Panel: Recent Labs    12/27/22 0000  CHOL 139  HDL 50  LDLCALC 70  TRIG 106   Lab Results  Component Value Date   HGBA1C 5.5 09/28/2018     Procedures since last visit: No results found.  Assessment/Plan 1. GAD (generalized anxiety disorder) (Primary) Continue Ativan , Wellbutrin, Lexapro and Remeron per Psych Services  2. Obsessive-compulsive disorder, unspecified type Stays in her Room due to her Anxiety Atarax  3. Primary hypertension Procardia  4. Hyperlipidemia LDL goal <100 Statin Lipid Panel ordered  5. Gastroesophageal reflux disease without esophagitis Protonix  6. Primary osteoarthritis involving multiple joints Tylenol PRN  7. Slow transit constipation Doing well with Miralax and AMitiza  8. Dizziness Caused by her Anxiety Per Neurology No More work up Encourage PO fluids 9 Vaginal Cyst Continue to monitor   Labs/tests ordered:  CBC,CMP,Lipid Next appt:  Visit date not found

## 2023-07-31 DIAGNOSIS — E034 Atrophy of thyroid (acquired): Secondary | ICD-10-CM | POA: Diagnosis not present

## 2023-07-31 DIAGNOSIS — D649 Anemia, unspecified: Secondary | ICD-10-CM | POA: Diagnosis not present

## 2023-07-31 DIAGNOSIS — E039 Hypothyroidism, unspecified: Secondary | ICD-10-CM | POA: Diagnosis not present

## 2023-07-31 LAB — HEPATIC FUNCTION PANEL
ALT: 8 U/L (ref 7–35)
AST: 14 (ref 13–35)
Alkaline Phosphatase: 67 (ref 25–125)
Bilirubin, Total: 0.4

## 2023-07-31 LAB — LIPID PANEL
Cholesterol: 141 (ref 0–200)
HDL: 46 (ref 35–70)
LDL Cholesterol: 76
LDl/HDL Ratio: 3.1
Triglycerides: 108 (ref 40–160)

## 2023-07-31 LAB — BASIC METABOLIC PANEL WITH GFR
BUN: 21 (ref 4–21)
CO2: 31 — AB (ref 13–22)
Chloride: 101 (ref 99–108)
Creatinine: 0.9 (ref 0.5–1.1)
Glucose: 84
Potassium: 4.2 meq/L (ref 3.5–5.1)
Sodium: 139 (ref 137–147)

## 2023-07-31 LAB — COMPREHENSIVE METABOLIC PANEL WITH GFR
Albumin: 3.9 (ref 3.5–5.0)
Calcium: 9.7 (ref 8.7–10.7)
Globulin: 2.2
eGFR: 63

## 2023-07-31 LAB — CBC AND DIFFERENTIAL
HCT: 37 (ref 36–46)
Hemoglobin: 11.8 — AB (ref 12.0–16.0)
Platelets: 163 K/uL (ref 150–400)
WBC: 5.4

## 2023-07-31 LAB — CBC: RBC: 3.98 (ref 3.87–5.11)

## 2023-07-31 LAB — TSH: TSH: 2.22 (ref 0.41–5.90)

## 2023-08-02 DIAGNOSIS — M6281 Muscle weakness (generalized): Secondary | ICD-10-CM | POA: Diagnosis not present

## 2023-08-02 DIAGNOSIS — F419 Anxiety disorder, unspecified: Secondary | ICD-10-CM | POA: Diagnosis not present

## 2023-08-07 ENCOUNTER — Other Ambulatory Visit: Payer: Self-pay | Admitting: Orthopedic Surgery

## 2023-08-07 DIAGNOSIS — F411 Generalized anxiety disorder: Secondary | ICD-10-CM

## 2023-08-07 NOTE — Telephone Encounter (Signed)
 Pharmacy requested refill.  Epic LR: 07/10/2023 Contract Date; No Contract on file.  No upcoming appointment  Pended Rx and sent to Dr. Chales Abrahams for approval.

## 2023-08-08 ENCOUNTER — Other Ambulatory Visit: Payer: Self-pay | Admitting: Orthopedic Surgery

## 2023-08-08 DIAGNOSIS — F411 Generalized anxiety disorder: Secondary | ICD-10-CM

## 2023-08-08 MED ORDER — LORAZEPAM 0.5 MG PO TABS: 0.5000 mg | ORAL_TABLET | Freq: Four times a day (QID) | ORAL | 0 refills | Status: DC

## 2023-08-11 ENCOUNTER — Encounter: Payer: Self-pay | Admitting: Orthopedic Surgery

## 2023-08-11 ENCOUNTER — Non-Acute Institutional Stay: Payer: Self-pay | Admitting: Orthopedic Surgery

## 2023-08-11 DIAGNOSIS — Z0000 Encounter for general adult medical examination without abnormal findings: Secondary | ICD-10-CM | POA: Diagnosis not present

## 2023-08-11 NOTE — Patient Instructions (Signed)
  Tina Hampton , Thank you for taking time to come for your Medicare Wellness Visit. I appreciate your ongoing commitment to your health goals. Please review the following plan we discussed and let me know if I can assist you in the future.   These are the goals we discussed:  Goals      Anxiety Symptoms Monitored and Managed     Evidence-based guidance:  Discuss adherence to medication therapy; assess and manage barriers, such as costs, side effects, feeling little or no improvement, stigma or low motivation.  Discuss past experiences with psychotropic medication, potential side effects and need to continue medication until treatment becomes effective (e.g., 4 to 8 weeks).  Explain the interplay of stress and physical symptoms, as well as the relationship between illness and emotional problems.  Explore complementary and alternative therapy options, such as applied relaxation, mindfulness, meditation, music therapy, aromatherapy, acupuncture or massage.  Prepare patient for antidepressant pharmacologic therapy which may include additional short-term medications until maintenance medications demonstrate therapeutic effect.   Assess response; monitor and manage side effects.  Prepare patient for use of pharmacologic therapy (e.g., anxiolytic, selective serotonin-reuptake inhibitor, serotonin norepinephrine-reuptake inhibitor, tricyclic antidepressant, antiepileptic, antipsychotic for comorbid depression,   phosphodiesterase-inhibitor for sexual dysfunction, sedative or hypnotic for sleep disturbance); monitor and manage side effects.  Prepare patient for long-term pharmacologic therapy to prevent relapse (e.g., 12 months after symptom improvement).  Promote cognitive behavioral therapy, individualized to severity of symptoms [e.g., nonfacilitated self-help, facilitated (computerized or manual-based) self-help, face-to-face individual treatment].  Encourage patient to develop a self-management plan  that identifies and manages lifestyle triggers (e.g., caffeine, stimulants, nicotine, stress), improves sleep, exercise or physical activity based on tolerance.  Prepare patient for a referral to a psychiatrist when patient has a poor response to treatment, atypical presentation or comorbid psychiatric disorder.  Provide frequent follow-up (e.g., every 2 weeks for the first 6 to 8 weeks of treatment and monthly thereafter).  Explore means to support work reintegration, such as modified working hours, peer support or coaching or a community jobs program.   Notes:         This is a list of the screening recommended for you and due dates:  Health Maintenance  Topic Date Due   COVID-19 Vaccine (7 - 2024-25 season) 01/15/2023   Medicare Annual Wellness Visit  07/14/2023   Pneumonia Vaccine  Completed   Flu Shot  Completed   DEXA scan (bone density measurement)  Completed   Zoster (Shingles) Vaccine  Completed   HPV Vaccine  Aged Out   DTaP/Tdap/Td vaccine  Discontinued   Mammogram  Discontinued

## 2023-08-11 NOTE — Progress Notes (Signed)
 Subjective:   Tina Hampton is a 89 y.o. female who presents for Medicare Annual (Subsequent) preventive examination.  Visit Complete: In person  Patient Medicare AWV questionnaire was completed by the patient on 08/11/2023; I have confirmed that all information answered by patient is correct and no changes since this date.  Cardiac Risk Factors include: advanced age (>58men, >75 women);hypertension;sedentary lifestyle     Objective:    Today's Vitals   08/11/23 1021  BP: 129/68  Pulse: 64  Resp: 18  Temp: (!) 97.5 F (36.4 C)  SpO2: 94%  Weight: 144 lb 6.4 oz (65.5 kg)  Height: 5\' 2"  (1.575 m)   Body mass index is 26.41 kg/m.     03/20/2023    9:56 AM 12/26/2022   11:25 AM 11/30/2022   10:04 AM 08/15/2022   10:54 AM 07/13/2022   10:17 AM 06/09/2022    3:05 PM 03/17/2022   11:52 AM  Advanced Directives  Does Patient Have a Medical Advance Directive? Yes Yes Yes Yes Yes Yes Yes  Type of Estate agent of [place];Living will Healthcare Power of [place];Living will Healthcare Power of [place];Living will;Out of facility DNR (pink MOST or yellow form) Healthcare Power of [place];Living will;Out of facility DNR (pink MOST or yellow form) Healthcare Power of [place];Living will;Out of facility DNR (pink MOST or yellow form)  Living will  Does patient want to make changes to medical advance directive? No - Patient declined No - Patient declined No - Patient declined No - Patient declined No - Patient declined    Copy of Healthcare Power of Attorney in Chart? Yes - validated most recent copy scanned in chart (See row information) Yes - validated most recent copy scanned in chart (See row information) Yes - validated most recent copy scanned in chart (See row information) Yes - validated most recent copy scanned in chart (See row information) Yes - validated most recent copy scanned in chart (See row information)      Current Medications  (verified) Outpatient Encounter Medications as of 08/11/2023  Medication Sig   antiseptic oral rinse (BIOTENE) LIQD 10 mLs by Mouth Rinse route as needed for dry mouth. Resident may self administer and keep in room   Artificial Tear Ointment (DRY EYES OP) Place 2 drops into both eyes in the morning, at noon, in the evening, and at bedtime.   aspirin 81 MG chewable tablet Chew 81 mg by mouth daily.   Bismuth Subsalicylate (PEPTO-BISMOL PO) Take 30 mg by mouth as needed. For diarrhea   buPROPion (WELLBUTRIN XL) 150 MG 24 hr tablet Take 150 mg by mouth daily.   Calcium Carb-Cholecalciferol 600-800 MG-UNIT TABS Take 1 tablet by mouth 2 (two) times daily.   cholecalciferol (VITAMIN D) 1000 UNITS tablet Take 1,000 Units by mouth daily.   docusate sodium (COLACE) 100 MG capsule Take 1 capsule (100 mg total) by mouth daily.   escitalopram (LEXAPRO) 20 MG tablet Take 1 tablet (20 mg total) by mouth daily.   Eyelid Cleansers (OCUSOFT LID SCRUB PLUS) PADS Place 1 each into both eyes daily.   GLUCOSAMINE HCL PO Take 2,000 mg by mouth 2 (two) times daily.   hydrocortisone 2.5 % ointment Apply 1 application  topically 2 (two) times daily as needed.   hydrOXYzine (ATARAX) 10 MG tablet Take 1 tablet (10 mg total) by mouth 2 (two) times daily.   hypromellose (GENTEAL SEVERE) 0.3 % GEL ophthalmic ointment Place 1 Application into both eyes as needed for dry eyes.  LORazepam (ATIVAN) 0.5 MG tablet Take 1 tablet (0.5 mg total) by mouth in the morning, at noon, in the evening, and at bedtime.   lubiprostone (AMITIZA) 8 MCG capsule Take 8 mcg by mouth 2 (two) times daily with a meal.   Menthol, Topical Analgesic, 4 % GEL Apply 1 application  topically as needed. Resident may self administer and keep in room   miconazole (MICOTIN) 2 % powder Apply topically as needed for itching. apply to legs and feet once a day   mirtazapine (REMERON) 45 MG tablet Take 1 tablet (45 mg total) by mouth at bedtime.   Multiple Vitamin  (MULTIVITAMIN WITH MINERALS) TABS Take 1 tablet by mouth daily.   Multiple Vitamins-Minerals (PRESERVISION AREDS 2+MULTI VIT PO) Take 1 tablet by mouth 2 (two) times daily.   NIFEdipine (PROCARDIA XL/ADALAT-CC) 60 MG 24 hr tablet TAKE 1 TABLET BY MOUTH EVERY DAY TO CONTROL BLOOD PRESSURE   pantoprazole (PROTONIX) 40 MG tablet One daily to reduce stomach acid   polyethylene glycol (MIRALAX / GLYCOLAX) 17 g packet Take 17 g by mouth daily. Mix 17g  with 8 oz. of fluid daily for constipation   pravastatin (PRAVACHOL) 10 MG tablet Take 10 mg by mouth daily.   No facility-administered encounter medications on file as of 08/11/2023.    Allergies (verified) Brexpiprazole, Lamictal [lamotrigine], Penicillins, and Abilify [aripiprazole]   History: Past Medical History:  Diagnosis Date   Abnormal mammogram, unspecified 04/19/2011   Allergy    Anxiety    Backache, unspecified 01/21/2005   Cervicalgia 08/20/2013   Chronic kidney disease, stage II (mild) 04/03/2009   Chronic rhinitis 09/28/2010   DDD (degenerative disc disease)    Depression    Dermatophytosis of nail 01/21/2005   Diverticulosis of colon (without mention of hemorrhage) 06/17/2005   Edema 11/26/2004   GERD (gastroesophageal reflux disease)    Hiatal hernia    High cholesterol    Hyperglycemia 11/18/2014   Hypertension    Insomnia, unspecified 10/18/2011   Left inguinal hernia 04/24/2014   Nocturia 09/19/2008   Osteoporosis, senile 11/18/2014   Other abnormal blood chemistry 04/03/2007   Pain in joint, pelvic region and thigh 11/12/2011   Pain in joint, shoulder region 08/15/2006   Pain in limb 08/26/2005   Palpitations 09/29/2009   PAOD (peripheral arterial occlusive disease) (HCC) 11/18/2014   Right leg; diminished popliteal, dorsalis pedis, and posterior tibial artery pulsations    Postmenopausal atrophic vaginitis 12/14/2004   Scoliosis    Senile osteoporosis 12/14/2004   Tension headache 10/04/2005   Unspecified cataract 11/26/2004    Unspecified constipation 01/21/2005   Unspecified urinary incontinence 11/26/2004   Vaginal stenosis 11/18/2014   Tight band about 1/2 inches into the vagina which will not allow passage of my index finger.    Past Surgical History:  Procedure Laterality Date   ABDOMINAL HYSTERECTOMY  1969   Dr.Braun    bilateral eyelid surgery  2005   Dr.Scott   BILATERAL OOPHORECTOMY  2002   BREAST SURGERY     benign tumor removal, left breast 1969   CATARACT EXTRACTION     Right   CHOLECYSTECTOMY  1990   Dr.Moore    COLONOSCOPY  3/18/20008   inflammation, diverticular associated colitis -Dr. Christella Hartigan   COSMETIC SURGERY     ESOPHAGOGASTRODUODENOSCOPY N/A 01/24/2014   Procedure: ESOPHAGOGASTRODUODENOSCOPY (EGD);  Surgeon: Hilarie Fredrickson, MD;  Location: [facility] ENDOSCOPY;  Service: Endoscopy;  Laterality: N/A;   ESOPHAGOGASTRODUODENOSCOPY ENDOSCOPY  04/04/14   Dr. Christella Hartigan  EYE SURGERY     FOREIGN BODY REMOVAL N/A 01/24/2014   Procedure: FOREIGN BODY REMOVAL;  Surgeon: Hilarie Fredrickson, MD;  Location: [facility] ENDOSCOPY;  Service: Endoscopy;  Laterality: N/A;   HIP PINNING,CANNULATED  11/07/2011   Procedure: CANNULATED HIP PINNING;  Surgeon: Kerrin Champagne, MD;  Location: [facility] ORS;  Service: Orthopedics;  Laterality: Left;   ORIF FEMORAL NECK FRACTURE W/ [facility]  11/07/2011   Dr.Nitka    TONSILLECTOMY     Dr.Joe Katrinka Blazing   TUBAL LIGATION     VAGINAL PROLAPSE REPAIR  2002   Dr.Horback    Family History  Problem Relation Age of Onset   Heart disease Mother    Stroke Mother    Emphysema Father    Arthritis Father    Alcohol abuse Brother    Heart disease Son    Leukemia Son    Esophageal cancer Neg Hx    Stomach cancer Neg Hx    Colon cancer Neg Hx    Social History   Socioeconomic History   Marital status: Widowed    Spouse name: Not on file   Number of children: Not on file   Years of education: Not on file   Highest education level: Not on file  Occupational History   Occupation: retired Presenter, broadcasting: RETIRED  Tobacco Use   Smoking status: Never   Smokeless tobacco: Never  Vaping Use   Vaping status: Never Used  Substance and Sexual Activity   Alcohol use: Not Currently    Alcohol/week: 0.0 standard drinks of alcohol    Comment: one glass of wine in evening   Drug use: No   Sexual activity: Never  Other Topics Concern   Not on file  Social History Narrative   [facility]   Widowed (husband expired 2014)   Has Living Will, POA, MOST   Exercise: water aerobic  5 times a week   Walks with walker   Never smoked   Drinks moderate amount of caffeinate beverages daily   Alcohol none      Social Drivers of Corporate investment banker Strain: Low Risk  (08/11/2023)   Overall Financial Resource Strain (CARDIA)    Difficulty of Paying Living Expenses: Not hard at all  Food Insecurity: No Food Insecurity (08/11/2023)   Hunger Vital Sign    Worried About Running Out of Food in the Last Year: Never true    Ran Out of Food in the Last Year: Never true  Transportation Needs: No Transportation Needs (08/11/2023)   PRAPARE - Administrator, Civil Service (Medical): No    Lack of Transportation (Non-Medical): No  Physical Activity: Insufficiently Active (08/11/2023)   Exercise Vital Sign    Days of Exercise per Week: 3 days    Minutes of Exercise per Session: 10 min  Stress: Stress Concern Present (08/11/2023)   [employer] of Occupational Health - Occupational Stress Questionnaire    Feeling of Stress : Very much  Social Connections: Socially Isolated (08/11/2023)   Social Connection and Isolation Panel [NHANES]    Frequency of Communication with Friends and Family: Three times a week    Frequency of Social Gatherings with Friends and Family: Three times a week    Attends Religious Services: Never    Active Member of Clubs or Organizations: No    Attends Banker Meetings: Never    Marital Status: Widowed  Tobacco Counseling Counseling given: Not Answered   Clinical Intake:  Pre-visit preparation completed: Yes  Pain : No/denies pain     BMI - recorded: 26.41 Nutritional Status: BMI 25 -29 Overweight Nutritional Risks: None Diabetes: No  How often do you need to have someone help you when you read instructions, pamphlets, or other written materials from your doctor or pharmacy?: 2 - Rarely What is the last grade level you completed in school?: college  Interpreter Needed?: No      Activities of Daily Living    08/11/2023   11:23 AM  In your present state of health, do you have any difficulty performing the following activities:  Hearing? 0  Vision? 0  Difficulty concentrating or making decisions? 0  Walking or climbing stairs? 1  Dressing or bathing? 0  Doing errands, shopping? 1  Preparing Food and eating ? Y  Using the Toilet? N  In the past six months, have you accidently leaked urine? Y  Do you have problems with loss of bowel control? Y  Managing your Medications? Y  Managing your Finances? Y  Housekeeping or managing your Housekeeping? Y    Patient Care Team: Mahlon Gammon, MD as PCP - General (Internal Medicine) Shelva Majestic, [facility] Ernesto Rutherford, MD as Consulting Physician (Ophthalmology) Rachael Fee, MD (Inactive) as Consulting Physician (Gastroenterology) Archer Asa, MD as Consulting Physician (Psychiatry) Kerrin Champagne, MD (Inactive) as Consulting Physician (Orthopedic Surgery) Alfredo Martinez, MD as Consulting Physician (Urology)  Indicate any recent Medical Services you may have received from other than [facility] providers in the past year (date may be approximate).     Assessment:   This is a routine wellness examination for [place].  Hearing/Vision screen No results found.   Goals Addressed             This Visit's Progress    Anxiety Symptoms Monitored and Managed   Not on track    Evidence-based guidance:  Discuss  adherence to medication therapy; assess and manage barriers, such as costs, side effects, feeling little or no improvement, stigma or low motivation.  Discuss past experiences with psychotropic medication, potential side effects and need to continue medication until treatment becomes effective (e.g., 4 to 8 weeks).  Explain the interplay of stress and physical symptoms, as well as the relationship between illness and emotional problems.  Explore complementary and alternative therapy options, such as applied relaxation, mindfulness, meditation, music therapy, aromatherapy, acupuncture or massage.  Prepare patient for antidepressant pharmacologic therapy which may include additional short-term medications until maintenance medications demonstrate therapeutic effect.   Assess response; monitor and manage side effects.  Prepare patient for use of pharmacologic therapy (e.g., anxiolytic, selective serotonin-reuptake inhibitor, serotonin norepinephrine-reuptake inhibitor, tricyclic antidepressant, antiepileptic, antipsychotic for comorbid depression,   phosphodiesterase-inhibitor for sexual dysfunction, sedative or hypnotic for sleep disturbance); monitor and manage side effects.  Prepare patient for long-term pharmacologic therapy to prevent relapse (e.g., 12 months after symptom improvement).  Promote cognitive behavioral therapy, individualized to severity of symptoms [e.g., nonfacilitated self-help, facilitated (computerized or manual-based) self-help, face-to-face individual treatment].  Encourage patient to develop a self-management plan that identifies and manages lifestyle triggers (e.g., caffeine, stimulants, nicotine, stress), improves sleep, exercise or physical activity based on tolerance.  Prepare patient for a referral to a psychiatrist when patient has a poor response to treatment, atypical presentation or comorbid psychiatric disorder.  Provide frequent follow-up (e.g., every 2 weeks for the  first 6 to 8 weeks of treatment and monthly thereafter).  Explore means to support work reintegration, such as modified working hours, peer support or coaching or a community jobs program.   Notes:        Depression Screen    08/11/2023   11:25 AM 08/11/2023   11:24 AM 03/20/2023   12:59 PM 12/26/2022   10:39 AM 11/30/2022    4:30 PM 07/13/2022   11:30 AM 06/11/2021   11:36 AM  PHQ 2/9 Scores  PHQ - 2 Score 4 0 4 4 4 3 6   PHQ- 9 Score 14  9 10 11 9 9     Fall Risk    08/11/2023   11:25 AM 03/20/2023   12:58 PM 12/26/2022   10:48 AM 07/13/2022   11:31 AM 03/17/2022   11:51 AM  Fall Risk   Falls in the past year? 0 0 0 0 0  Number falls in past yr: 0 0 0 0 0  Injury with Fall? 0 0 0 0 0  Risk for fall due to : History of fall(s);Impaired mobility History of fall(s);Impaired balance/gait;Impaired mobility History of fall(s);Impaired balance/gait History of fall(s);Impaired balance/gait;Impaired mobility History of fall(s);Impaired balance/gait  Follow up Falls evaluation completed;Education provided Falls evaluation completed;Education provided;Falls prevention discussed Falls evaluation completed;Education provided;Falls prevention discussed Falls evaluation completed;Education provided;Falls prevention discussed Falls evaluation completed    MEDICARE RISK AT HOME: Medicare Risk at Home Any stairs in or around the home?: No If so, are there any without handrails?: No Home free of loose throw rugs in walkways, pet beds, electrical cords, etc?: Yes Adequate lighting in your home to reduce risk of falls?: Yes Life alert?: No Use of a cane, walker or w/c?: Yes Grab bars in the bathroom?: Yes Shower chair or bench in shower?: Yes Elevated toilet seat or a handicapped toilet?: Yes  TIMED UP AND GO:  Was the test performed?  No    Cognitive Function:    08/11/2023   10:43 AM 07/13/2022   11:33 AM 06/11/2021   11:46 AM 09/20/2017   10:04 AM 05/31/2016   10:42 AM  MMSE - Mini Mental  State Exam  Not completed:   Refused    Orientation to time 5 5  5 5   Orientation to Place 5 5  5 5   Registration 3 3  3 3   Attention/ Calculation 5 5  5 5   Recall 3 1  3 3   Language- name 2 objects 2 2  2 2   Language- repeat 1 1  1 1   Language- follow 3 step command 3 3  3 3   Language- read & follow direction 1 1  1 1   Write a sentence 1 1  1 1   Copy design 0 1   1  Total score 29 28   30         06/11/2021   11:46 AM  6CIT Screen  What Year? 0 points  What month? 0 points  What time? 3 points  Count back from 20 0 points  Months in reverse 2 points  Repeat phrase 0 points  Total Score 5 points    Immunizations Immunization History  Administered Date(s) Administered   Fluad Quad(high Dose 65+) 03/09/2022   Influenza Whole 02/14/2012, 02/14/2013   Influenza, High Dose Seasonal PF 03/14/2017, 02/26/2019, 03/14/2023   Influenza-Unspecified 02/27/2014, 02/12/2015, 02/25/2016, 03/15/2017, 02/19/2018, 02/18/2020, 03/09/2021   Moderna SARS-COV2 Booster Vaccination 10/21/2020   Moderna Sars-Covid-2 Vaccination 05/20/2019, 06/17/2019, 03/30/2020, 03/22/2022   PFIZER(Purple Top)SARS-COV-2 Vaccination 02/03/2021   Pneumococcal Conjugate-13 07/13/2015   Pneumococcal Polysaccharide-23  05/16/1998   Td 05/16/2004   Zoster Recombinant(Shingrix) 02/20/2018, 05/11/2018, 07/16/2018   Zoster, Live 05/16/2005    TDAP status: Due, Education has been provided regarding the importance of this vaccine. Advised may receive this vaccine at local pharmacy or Health Dept. Aware to provide a copy of the vaccination record if obtained from local pharmacy or Health Dept. Verbalized acceptance and understanding.  Flu Vaccine status: Up to date  Pneumococcal vaccine status: Up to date  Covid-19 vaccine status: Completed vaccines  Qualifies for Shingles Vaccine? Yes   Zostavax completed Yes   Shingrix Completed?: Yes  Screening Tests Health Maintenance  Topic Date Due   COVID-19 Vaccine (7 -  2024-25 season) 01/15/2023   Medicare Annual Wellness (AWV)  07/14/2023   Pneumonia Vaccine 11+ Years old  Completed   INFLUENZA VACCINE  Completed   DEXA SCAN  Completed   Zoster Vaccines- Shingrix  Completed   HPV VACCINES  Aged Out   DTaP/Tdap/Td  Discontinued   MAMMOGRAM  Discontinued    Health Maintenance  Health Maintenance Due  Topic Date Due   COVID-19 Vaccine (7 - 2024-25 season) 01/15/2023   Medicare Annual Wellness (AWV)  07/14/2023    Colorectal cancer screening: No longer required.   Mammogram status: No longer required due to advanced age.  Bone Density status: Completed 2018. Results reflect: Bone density results: OSTEOPOROSIS. Repeat every 2 years.  Lung Cancer Screening: (Low Dose CT Chest recommended if Age 61-80 years, 20 pack-year currently smoking OR have quit w/in 15years.) does not qualify.   Lung Cancer Screening Referral: No  Additional Screening:  Hepatitis C Screening: does not qualify; Completed   Vision Screening: Recommended annual ophthalmology exams for early detection of glaucoma and other disorders of the eye. Is the patient up to date with their annual eye exam?  Yes  Who is the provider or what is the name of the office in which the patient attends annual eye exams? Dr. Emily Filbert If pt is not established with a provider, would they like to be referred to a provider to establish care? No .   Dental Screening: Recommended annual dental exams for proper oral hygiene  Diabetic Foot Exam: Diabetic Foot Exam: Completed 08/11/2023  Community Resource Referral / Chronic Care Management: CRR required this visit?  No   CCM required this visit?  No     Plan:     I have personally reviewed and noted the following in the patient's chart:   Medical and social history Use of alcohol, tobacco or illicit drugs  Current medications and supplements including opioid prescriptions. Patient is not currently taking opioid prescriptions. Functional  ability and status Nutritional status Physical activity Advanced directives List of other physicians Hospitalizations, surgeries, and ER visits in previous 12 months Vitals Screenings to include cognitive, depression, and falls Referrals and appointments  In addition, I have reviewed and discussed with patient certain preventive protocols, quality metrics, and best practice recommendations. A written personalized care plan for preventive services as well as general preventive health recommendations were provided to patient.     Octavia Heir, NP   08/11/2023   After Visit Summary: (MyChart) Due to this being a telephonic visit, the after visit summary with patients personalized plan was offered to patient via MyChart   Nurse Notes: MMSE 29/30. She is not interested in another Tdap, Prevnar 20 or bone density test due to advanced age.

## 2023-09-06 DIAGNOSIS — Z23 Encounter for immunization: Secondary | ICD-10-CM | POA: Diagnosis not present

## 2023-09-20 ENCOUNTER — Other Ambulatory Visit: Payer: Self-pay | Admitting: Orthopedic Surgery

## 2023-09-20 DIAGNOSIS — F411 Generalized anxiety disorder: Secondary | ICD-10-CM

## 2023-09-20 MED ORDER — LORAZEPAM 0.5 MG PO TABS: 0.5000 mg | ORAL_TABLET | Freq: Four times a day (QID) | ORAL | 0 refills | Status: DC

## 2023-10-16 ENCOUNTER — Other Ambulatory Visit: Payer: Self-pay | Admitting: Orthopedic Surgery

## 2023-10-16 DIAGNOSIS — F411 Generalized anxiety disorder: Secondary | ICD-10-CM

## 2023-10-16 MED ORDER — LORAZEPAM 0.5 MG PO TABS: 0.5000 mg | ORAL_TABLET | Freq: Four times a day (QID) | ORAL | 0 refills | Status: DC

## 2023-10-18 DIAGNOSIS — F419 Anxiety disorder, unspecified: Secondary | ICD-10-CM | POA: Diagnosis not present

## 2023-10-18 DIAGNOSIS — R41841 Cognitive communication deficit: Secondary | ICD-10-CM | POA: Diagnosis not present

## 2023-10-20 ENCOUNTER — Encounter: Payer: Self-pay | Admitting: Orthopedic Surgery

## 2023-10-20 ENCOUNTER — Non-Acute Institutional Stay: Payer: Self-pay | Admitting: Orthopedic Surgery

## 2023-10-20 DIAGNOSIS — F429 Obsessive-compulsive disorder, unspecified: Secondary | ICD-10-CM | POA: Diagnosis not present

## 2023-10-20 DIAGNOSIS — F339 Major depressive disorder, recurrent, unspecified: Secondary | ICD-10-CM

## 2023-10-20 DIAGNOSIS — E785 Hyperlipidemia, unspecified: Secondary | ICD-10-CM

## 2023-10-20 DIAGNOSIS — M150 Primary generalized (osteo)arthritis: Secondary | ICD-10-CM

## 2023-10-20 DIAGNOSIS — M810 Age-related osteoporosis without current pathological fracture: Secondary | ICD-10-CM

## 2023-10-20 DIAGNOSIS — K219 Gastro-esophageal reflux disease without esophagitis: Secondary | ICD-10-CM | POA: Diagnosis not present

## 2023-10-20 DIAGNOSIS — I10 Essential (primary) hypertension: Secondary | ICD-10-CM | POA: Diagnosis not present

## 2023-10-20 DIAGNOSIS — K5901 Slow transit constipation: Secondary | ICD-10-CM | POA: Diagnosis not present

## 2023-10-20 DIAGNOSIS — N898 Other specified noninflammatory disorders of vagina: Secondary | ICD-10-CM

## 2023-10-20 MED ORDER — DOCUSATE SODIUM 100 MG PO CAPS: 100.0000 mg | ORAL_CAPSULE | ORAL | Status: AC

## 2023-10-20 MED ORDER — HYDROCORTISONE 2.5 % EX OINT: 1.0000 | TOPICAL_OINTMENT | Freq: Two times a day (BID) | CUTANEOUS | Status: DC | PRN

## 2023-10-20 NOTE — Progress Notes (Signed)
 Location:  [facility] Room Number: 8/A Place of Service:  ALF [phone]) Provider:  Arnetha Bhat, NP   Marguerite Shiley, MD  Patient Care Team: Marguerite Shiley, MD as PCP - General (Internal Medicine) Stephenie Einstein, [facility] Maris Sickle, MD as Consulting Physician (Ophthalmology) Janel Medford, MD (Inactive) as Consulting Physician (Gastroenterology) Alba Ally, MD as Consulting Physician (Psychiatry) Alphonso Jean, MD (Inactive) as Consulting Physician (Orthopedic Surgery) Erman Hayward, MD as Consulting Physician (Urology)  Extended Emergency Contact Information Primary Emergency Contact: [facility] Phone: [phone] Relation: Son Secondary Emergency Contact: [place], [address], [place] [address] [place] Home Phone: [phone] Mobile Phone: [phone] Relation: Son  Code Status:  DNR Goals of care: Advanced Directive information    03/20/2023    9:56 AM  Advanced Directives  Does Patient Have a Medical Advance Directive? Yes  Type of Estate agent of [place];Living will  Does patient want to make changes to medical advance directive? No - Patient declined  Copy of Healthcare Power of Attorney in Chart? Yes - validated most recent copy scanned in chart (See row information)     Chief Complaint  Patient presents with   Medical Management of Chronic Issues    HPI:  Pt is a 89 y.o. female seen today for medical management of chronic diseases.    She currently resides on the assisted living unit at [facility]. PMH: HTN, HLD, GAD, major depression, left breast surgery 1969, oophorectomy 2002, cholecystectomy, chronic back pain, spinal stenosis, OA, osteoporosis.    Depression/OCD/GAD- followed by psych(does not recommend Buspar or reducing Wellbutrin ) - no further recommendations at this time, remains on lorazepam , Remeron , Wellbutrin , hydroxyzine  prn and Lexapro ,  Na+ 139  07/31/2023 HTN- BUN/creat 21/0.85 07/31/2023 , remains on Procardia  HLD- LDL 76 07/31/2023, remain on pravastatin  GERD- hgb 11.8 07/31/2023, remains on Protonix  OA- remains on biofreeze/tylenol /meloxicam  Osteoporosis- DEXA 2018, t score -2.9, remains on calcium  and vitamin D  Constipation- complaining of smearing with BM, remains on colace,miralax  and Amitizia Vaginal cyst- stable with hydrocortisone 1% & mupirocin 2% ointment   No recent falls or injuries.   Recent blood pressures:  06/04- 124/72  05/28- 110/64  05/21- 120/69  Recent weights:  06/05- 144.2 lbs  05/14- 144.2 lbs  04/01- 143.4 lbs   Past Medical History:  Diagnosis Date   Abnormal mammogram, unspecified 04/19/2011   Allergy    Anxiety    Backache, unspecified 01/21/2005   Cervicalgia 08/20/2013   Chronic kidney disease, stage II (mild) 04/03/2009   Chronic rhinitis 09/28/2010   DDD (degenerative disc disease)    Depression    Dermatophytosis of nail 01/21/2005   Diverticulosis of colon (without mention of hemorrhage) 06/17/2005   Edema 11/26/2004   GERD (gastroesophageal reflux disease)    Hiatal hernia    High cholesterol    Hyperglycemia 11/18/2014   Hypertension    Insomnia, unspecified 10/18/2011   Left inguinal hernia 04/24/2014   Nocturia 09/19/2008   Osteoporosis, senile 11/18/2014   Other abnormal blood chemistry 04/03/2007   Pain in joint, pelvic region and thigh 11/12/2011   Pain in joint, shoulder region 08/15/2006   Pain in limb 08/26/2005   Palpitations 09/29/2009   PAOD (peripheral arterial occlusive disease) (HCC) 11/18/2014   Right leg; diminished popliteal, dorsalis pedis, and posterior tibial artery pulsations    Postmenopausal atrophic vaginitis 12/14/2004   Scoliosis    Senile osteoporosis 12/14/2004  Tension headache 10/04/2005   Unspecified cataract 11/26/2004   Unspecified constipation 01/21/2005   Unspecified urinary incontinence 11/26/2004   Vaginal stenosis 11/18/2014   Tight band about 1/2 inches into  the vagina which will not allow passage of my index finger.    Past Surgical History:  Procedure Laterality Date   ABDOMINAL HYSTERECTOMY  1969   Dr.Braun    bilateral eyelid surgery  2005   Dr.Scott   BILATERAL OOPHORECTOMY  2002   BREAST SURGERY     benign tumor removal, left breast 1969   CATARACT EXTRACTION     Right   CHOLECYSTECTOMY  1990   Dr.Moore    COLONOSCOPY  3/18/20008   inflammation, diverticular associated colitis -Dr. Howard Macho   COSMETIC SURGERY     ESOPHAGOGASTRODUODENOSCOPY N/A 01/24/2014   Procedure: ESOPHAGOGASTRODUODENOSCOPY (EGD);  Surgeon: Tobin Forts, MD;  Location: [facility] ENDOSCOPY;  Service: Endoscopy;  Laterality: N/A;   ESOPHAGOGASTRODUODENOSCOPY ENDOSCOPY  04/04/14   Dr. Howard Macho   EYE SURGERY     FOREIGN BODY REMOVAL N/A 01/24/2014   Procedure: FOREIGN BODY REMOVAL;  Surgeon: Tobin Forts, MD;  Location: [facility] ENDOSCOPY;  Service: Endoscopy;  Laterality: N/A;   HIP PINNING,CANNULATED  11/07/2011   Procedure: CANNULATED HIP PINNING;  Surgeon: Alphonso Jean, MD;  Location: [facility] ORS;  Service: Orthopedics;  Laterality: Left;   ORIF FEMORAL NECK FRACTURE W/ DHS  11/07/2011   Dr.Nitka    TONSILLECTOMY     Dr.Joe Felipe Horton   TUBAL LIGATION     VAGINAL PROLAPSE REPAIR  2002   Dr.Horback     Allergies  Allergen Reactions   Brexpiprazole Swelling   Lamictal [Lamotrigine] Swelling    Lip swelling   Penicillins Other (See Comments)    CHILDHOOD REACTION   Abilify [Aripiprazole] Rash    Outpatient Encounter Medications as of 10/20/2023  Medication Sig   antiseptic oral rinse (BIOTENE) LIQD 10 mLs by Mouth Rinse route as needed for dry mouth. Resident may self administer and keep in room   Artificial Tear Ointment (DRY EYES OP) Place 2 drops into both eyes in the morning, at noon, in the evening, and at bedtime.   aspirin  81 MG chewable tablet Chew 81 mg by mouth daily.   Bismuth Subsalicylate (PEPTO-BISMOL PO) Take 30 mg by mouth as needed. For diarrhea   buPROPion   (WELLBUTRIN  XL) 150 MG 24 hr tablet Take 150 mg by mouth daily.   Calcium  Carb-Cholecalciferol  600-800 MG-UNIT TABS Take 1 tablet by mouth 2 (two) times daily.   cholecalciferol  (VITAMIN D ) 1000 UNITS tablet Take 1,000 Units by mouth daily.   docusate sodium  (COLACE) 100 MG capsule Take 1 capsule (100 mg total) by mouth daily.   escitalopram  (LEXAPRO ) 20 MG tablet Take 1 tablet (20 mg total) by mouth daily.   Eyelid Cleansers (OCUSOFT LID SCRUB PLUS) PADS Place 1 each into both eyes daily.   GLUCOSAMINE HCL PO Take 2,000 mg by mouth 2 (two) times daily.   hydrocortisone 2.5 % ointment Apply 1 application  topically 2 (two) times daily as needed.   hydrOXYzine  (ATARAX ) 10 MG tablet Take 1 tablet (10 mg total) by mouth 2 (two) times daily.   hypromellose (GENTEAL SEVERE) 0.3 % GEL ophthalmic ointment Place 1 Application into both eyes as needed for dry eyes.   LORazepam  (ATIVAN ) 0.5 MG tablet Take 1 tablet (0.5 mg total) by mouth in the morning, at noon, in the evening, and at bedtime.   lubiprostone (AMITIZA) 8 MCG capsule Take 8  mcg by mouth 2 (two) times daily with a meal.   Menthol , Topical Analgesic, 4 % GEL Apply 1 application  topically as needed. Resident may self administer and keep in room   miconazole (MICOTIN) 2 % powder Apply topically as needed for itching. apply to legs and feet once a day   mirtazapine  (REMERON ) 45 MG tablet Take 1 tablet (45 mg total) by mouth at bedtime.   Multiple Vitamin (MULTIVITAMIN WITH MINERALS) TABS Take 1 tablet by mouth daily.   Multiple Vitamins-Minerals (PRESERVISION AREDS 2+MULTI VIT PO) Take 1 tablet by mouth 2 (two) times daily.   NIFEdipine  (PROCARDIA  XL/ADALAT -CC) 60 MG 24 hr tablet TAKE 1 TABLET BY MOUTH EVERY DAY TO CONTROL BLOOD PRESSURE   pantoprazole  (PROTONIX ) 40 MG tablet One daily to reduce stomach acid   polyethylene glycol (MIRALAX  / GLYCOLAX ) 17 g packet Take 17 g by mouth daily. Mix 17g  with 8 oz. of fluid daily for constipation    pravastatin  (PRAVACHOL ) 10 MG tablet Take 10 mg by mouth daily.   No facility-administered encounter medications on file as of 10/20/2023.    Review of Systems  Constitutional:  Negative for fatigue and fever.  HENT:  Negative for sore throat and trouble swallowing.   Respiratory:  Negative for cough and shortness of breath.   Cardiovascular:  Negative for chest pain and leg swelling.  Gastrointestinal:  Negative for abdominal distention and abdominal pain.  Genitourinary:  Negative for dysuria, frequency, hematuria and vaginal bleeding.  Musculoskeletal:  Positive for arthralgias and gait problem.  Skin:  Negative for wound.  Neurological:  Positive for dizziness and weakness. Negative for headaches.  Psychiatric/Behavioral:  Positive for dysphoric mood. Negative for confusion, sleep disturbance and suicidal ideas. The patient is nervous/anxious.     Immunization History  Administered Date(s) Administered   Fluad Quad(high Dose 65+) 03/09/2022   Influenza Whole 02/14/2012, 02/14/2013   Influenza, High Dose Seasonal PF 03/14/2017, 02/26/2019, 03/14/2023   Influenza-Unspecified 02/27/2014, 02/12/2015, 02/25/2016, 03/15/2017, 02/19/2018, 02/18/2020, 03/09/2021   Moderna SARS-COV2 Booster Vaccination 10/21/2020   Moderna Sars-Covid-2 Vaccination 05/20/2019, 06/17/2019, 03/30/2020, 03/22/2022   PFIZER(Purple Top)SARS-COV-2 Vaccination 02/03/2021   Pneumococcal Conjugate-13 07/13/2015   Pneumococcal Polysaccharide-23 05/16/1998   Td 05/16/2004   Zoster Recombinant(Shingrix) 02/20/2018, 05/11/2018, 07/16/2018   Zoster, Live 05/16/2005   Pertinent  Health Maintenance Due  Topic Date Due   INFLUENZA VACCINE  12/15/2023   DEXA SCAN  Completed   MAMMOGRAM  Discontinued      03/17/2022   11:51 AM 07/13/2022   11:31 AM 12/26/2022   10:48 AM 03/20/2023   12:58 PM 08/11/2023   11:25 AM  Fall Risk  Falls in the past year? 0 0 0 0 0  Was there an injury with Fall? 0 0 0 0 0  Fall Risk  Category Calculator 0 0 0 0 0  Fall Risk Category (Retired) Low      (RETIRED) Patient Fall Risk Level Low fall risk      Patient at Risk for Falls Due to History of fall(s);Impaired balance/gait History of fall(s);Impaired balance/gait;Impaired mobility History of fall(s);Impaired balance/gait History of fall(s);Impaired balance/gait;Impaired mobility History of fall(s);Impaired mobility  Fall risk Follow up Falls evaluation completed Falls evaluation completed;Education provided;Falls prevention discussed Falls evaluation completed;Education provided;Falls prevention discussed Falls evaluation completed;Education provided;Falls prevention discussed Falls evaluation completed;Education provided   Functional Status Survey:    Vitals:   10/20/23 0941  BP: 124/72  Resp: 18  Temp: (!) 97.4 F (36.3 C)  SpO2: 96%  Weight: 144 lb 3.2 oz (65.4 kg)  Height: 5\' 2"  (1.575 m)   Body mass index is 26.37 kg/m. Physical Exam Vitals reviewed.  Constitutional:      General: She is not in acute distress. HENT:     Head: Normocephalic.     Right Ear: There is no impacted cerumen.     Left Ear: There is no impacted cerumen.     Nose: Nose normal.     Mouth/Throat:     Mouth: Mucous membranes are moist.  Eyes:     General:        Right eye: No discharge.        Left eye: No discharge.  Cardiovascular:     Rate and Rhythm: Normal rate and regular rhythm.     Pulses: Normal pulses.     Heart sounds: Murmur heard.  Pulmonary:     Effort: Pulmonary effort is normal.     Breath sounds: Normal breath sounds.  Abdominal:     General: Bowel sounds are normal.     Palpations: Abdomen is soft.  Musculoskeletal:     Cervical back: Neck supple.     Right lower leg: No edema.     Left lower leg: No edema.  Skin:    General: Skin is warm.     Capillary Refill: Capillary refill takes less than 2 seconds.  Neurological:     General: No focal deficit present.     Mental Status: She is alert and  oriented to person, place, and time.     Motor: Weakness present.     Gait: Gait abnormal.  Psychiatric:        Mood and Affect: Mood normal.     Labs reviewed: Recent Labs    12/27/22 0000  NA 138  K 4.1  CL 102  CO2 27*  BUN 24*  CREATININE 0.8  CALCIUM  10.0   Recent Labs    12/27/22 0000  AST 14  ALT 10  ALKPHOS 72  ALBUMIN 4.0   Recent Labs    12/27/22 0000  WBC 5.3  NEUTROABS 3,864.00  HGB 12.2  HCT 37  PLT 188   Lab Results  Component Value Date   TSH 2.10 03/08/2021   Lab Results  Component Value Date   HGBA1C 5.5 09/28/2018   Lab Results  Component Value Date   CHOL 139 12/27/2022   HDL 50 12/27/2022   LDLCALC 70 12/27/2022   TRIG 106 12/27/2022   CHOLHDL 3.5 08/04/2017    Significant Diagnostic Results in last 30 days:  No results found.  Assessment/Plan 1. Major depression, recurrent, chronic (HCC) (Primary) - ongoing - PHQ score 11 - no changes in mood - does not leave room often - cont Wellbutrin  and Remeron   2. Obsessive-compulsive disorder, unspecified type - Na+ stable - cont Lexapro  and ativan   3. Primary hypertension - controlled with Procardia   4. Hyperlipidemia LDL goal <100 - LDL at goal - cont pravastatin   5. Gastroesophageal reflux disease without esophagitis - hgb stable - cont Protonix   6. Primary osteoarthritis involving multiple joints - cont tylenol  prn  7. Age-related osteoporosis without current pathological fracture - no future studies due to age - cont calcium  and vitamin D   8. Slow transit constipation - c/o increased smearing with BM - will reduce colace to every other day - cont miralax  and amitizia  9. Vaginal cyst - stable with hydrocortisone and mupirocin ointment     Family/ staff Communication: plan discussed with  patient and nurse  Labs/tests ordered:  none

## 2023-10-31 DIAGNOSIS — M79672 Pain in left foot: Secondary | ICD-10-CM | POA: Diagnosis not present

## 2023-10-31 DIAGNOSIS — M79671 Pain in right foot: Secondary | ICD-10-CM | POA: Diagnosis not present

## 2023-10-31 DIAGNOSIS — B351 Tinea unguium: Secondary | ICD-10-CM | POA: Diagnosis not present

## 2023-12-07 ENCOUNTER — Other Ambulatory Visit: Payer: Self-pay | Admitting: Internal Medicine

## 2023-12-07 DIAGNOSIS — F411 Generalized anxiety disorder: Secondary | ICD-10-CM

## 2023-12-07 MED ORDER — LORAZEPAM 0.5 MG PO TABS
0.5000 mg | ORAL_TABLET | Freq: Four times a day (QID) | ORAL | 0 refills | Status: DC
Start: 2023-12-07 — End: 2024-01-05

## 2023-12-08 ENCOUNTER — Other Ambulatory Visit: Payer: Self-pay | Admitting: Orthopedic Surgery

## 2023-12-08 DIAGNOSIS — F411 Generalized anxiety disorder: Secondary | ICD-10-CM

## 2024-01-05 ENCOUNTER — Other Ambulatory Visit: Payer: Self-pay | Admitting: Orthopedic Surgery

## 2024-01-05 DIAGNOSIS — F411 Generalized anxiety disorder: Secondary | ICD-10-CM

## 2024-01-05 MED ORDER — LORAZEPAM 0.5 MG PO TABS: 0.5000 mg | ORAL_TABLET | Freq: Four times a day (QID) | ORAL | 0 refills | Status: DC

## 2024-01-29 ENCOUNTER — Encounter: Payer: Self-pay | Admitting: Orthopedic Surgery

## 2024-01-29 ENCOUNTER — Non-Acute Institutional Stay: Payer: Self-pay | Admitting: Orthopedic Surgery

## 2024-01-29 DIAGNOSIS — R062 Wheezing: Secondary | ICD-10-CM

## 2024-01-29 DIAGNOSIS — R051 Acute cough: Secondary | ICD-10-CM | POA: Diagnosis not present

## 2024-01-29 MED ORDER — PREDNISONE 20 MG PO TABS
ORAL_TABLET | ORAL | Status: AC
Start: 2024-01-29 — End: 2024-02-05

## 2024-01-29 MED ORDER — IPRATROPIUM-ALBUTEROL 0.5-2.5 (3) MG/3ML IN SOLN: 3.0000 mL | Freq: Two times a day (BID) | RESPIRATORY_TRACT | Status: DC | PRN

## 2024-01-29 NOTE — Progress Notes (Signed)
 Location:  [facility] Room Number: 8/A Place of Service:  ALF [phone]) Provider:  Greig FORBES Cluster, NP   [name], MD  Patient Care Team: [name], MD as PCP - General (Internal Medicine) Devora, [facility] Octavia Charleston, MD as Consulting Physician (Ophthalmology) Teressa Toribio SQUIBB, MD (Inactive) as Consulting Physician (Gastroenterology) Tasia Lung, MD as Consulting Physician (Psychiatry) Lucilla Lynwood FORBES, MD (Inactive) as Consulting Physician (Orthopedic Surgery) Gaston Hamilton, MD as Consulting Physician (Urology)  Extended Emergency Contact Information Primary Emergency Contact: [facility] Phone: [phone] Relation: Son Secondary Emergency Contact: [place], [place], [name] [place] Home Phone: [phone] Mobile Phone: [phone] Relation: Son  Code Status:  DNR Goals of care: Advanced Directive information    03/20/2023    9:56 AM  Advanced Directives  Does Patient Have a Medical Advance Directive? Yes  Type of Estate agent of [place];Living will  Does patient want to make changes to medical advance directive? No - Patient declined  Copy of Healthcare Power of Attorney in Chart? Yes - validated most recent copy scanned in chart (See row information)     Chief Complaint  Patient presents with   Acute Visit    Wheezing, cough    HPI:  Pt is a 89 y.o. female seen today for acute visit due to wheezing and dry cough.   he currently resides on the assisted living unit at [facility]. PMH: HTN, HLD, GAD, major depression, left breast surgery 1969, oophorectomy 2002, cholecystectomy, chronic back pain, spinal stenosis, OA, osteoporosis.    Increased intermittent wheezing and dry cough x 3 days. Covid tests x 3 negative. Also having some mild nasal congestion. She reports worsened symptoms after working with PT. Afebrile. Vitals stable.   Past Medical  History:  Diagnosis Date   Abnormal mammogram, unspecified 04/19/2011   Allergy    Anxiety    Backache, unspecified 01/21/2005   Cervicalgia 08/20/2013   Chronic kidney disease, stage II (mild) 04/03/2009   Chronic rhinitis 09/28/2010   DDD (degenerative disc disease)    Depression    Dermatophytosis of nail 01/21/2005   Diverticulosis of colon (without mention of hemorrhage) 06/17/2005   Edema 11/26/2004   GERD (gastroesophageal reflux disease)    Hiatal hernia    High cholesterol    Hyperglycemia 11/18/2014   Hypertension    Insomnia, unspecified 10/18/2011   Left inguinal hernia 04/24/2014   Nocturia 09/19/2008   Osteoporosis, senile 11/18/2014   Other abnormal blood chemistry 04/03/2007   Pain in joint, pelvic region and thigh 11/12/2011   Pain in joint, shoulder region 08/15/2006   Pain in limb 08/26/2005   Palpitations 09/29/2009   PAOD (peripheral arterial occlusive disease) (HCC) 11/18/2014   Right leg; diminished popliteal, dorsalis pedis, and posterior tibial artery pulsations    Postmenopausal atrophic vaginitis 12/14/2004   Scoliosis    Senile osteoporosis 12/14/2004   Tension headache 10/04/2005   Unspecified cataract 11/26/2004   Unspecified constipation 01/21/2005   Unspecified urinary incontinence 11/26/2004   Vaginal stenosis 11/18/2014   Tight band about 1/2 inches into the vagina which will not allow passage of my index finger.    Past Surgical History:  Procedure Laterality Date   ABDOMINAL HYSTERECTOMY  1969   Dr.Braun    bilateral eyelid surgery  2005   Dr.Scott   BILATERAL OOPHORECTOMY  2002   BREAST SURGERY     benign  tumor removal, left breast 1969   CATARACT EXTRACTION     Right   CHOLECYSTECTOMY  1990   Dr.Moore    COLONOSCOPY  3/18/20008   inflammation, diverticular associated colitis -Dr. Teressa   COSMETIC SURGERY     ESOPHAGOGASTRODUODENOSCOPY N/A 01/24/2014   Procedure: ESOPHAGOGASTRODUODENOSCOPY (EGD);  Surgeon: Norleen LOISE Kiang, MD;  Location: [facility] ENDOSCOPY;  Service:  Endoscopy;  Laterality: N/A;   ESOPHAGOGASTRODUODENOSCOPY ENDOSCOPY  04/04/14   Dr. Teressa   EYE SURGERY     FOREIGN BODY REMOVAL N/A 01/24/2014   Procedure: FOREIGN BODY REMOVAL;  Surgeon: Norleen LOISE Kiang, MD;  Location: [facility] ENDOSCOPY;  Service: Endoscopy;  Laterality: N/A;   HIP PINNING,CANNULATED  11/07/2011   Procedure: CANNULATED HIP PINNING;  Surgeon: Lynwood FORBES Better, MD;  Location: [facility] ORS;  Service: Orthopedics;  Laterality: Left;   ORIF FEMORAL NECK FRACTURE W/ DHS  11/07/2011   Dr.Nitka    TONSILLECTOMY     Dr.Joe Claudene   TUBAL LIGATION     VAGINAL PROLAPSE REPAIR  2002   Dr.Horback     Allergies  Allergen Reactions   Brexpiprazole Swelling   Lamictal [Lamotrigine] Swelling    Lip swelling   Penicillins Other (See Comments)    CHILDHOOD REACTION   Abilify [Aripiprazole] Rash    Outpatient Encounter Medications as of 01/29/2024  Medication Sig   antiseptic oral rinse (BIOTENE) LIQD 10 mLs by Mouth Rinse route as needed for dry mouth. Resident may self administer and keep in room   Artificial Tear Ointment (DRY EYES OP) Place 2 drops into both eyes in the morning, at noon, in the evening, and at bedtime.   aspirin  81 MG chewable tablet Chew 81 mg by mouth daily.   Bismuth Subsalicylate (PEPTO-BISMOL PO) Take 30 mg by mouth as needed. For diarrhea   buPROPion  (WELLBUTRIN  XL) 150 MG 24 hr tablet Take 150 mg by mouth daily.   Calcium  Carb-Cholecalciferol  600-800 MG-UNIT TABS Take 1 tablet by mouth 2 (two) times daily.   cholecalciferol  (VITAMIN D ) 1000 UNITS tablet Take 1,000 Units by mouth daily.   docusate sodium  (COLACE) 100 MG capsule Take 1 capsule (100 mg total) by mouth every other day.   escitalopram  (LEXAPRO ) 20 MG tablet Take 1 tablet (20 mg total) by mouth daily.   Eyelid Cleansers (OCUSOFT LID SCRUB PLUS) PADS Place 1 each into both eyes daily.   GLUCOSAMINE HCL PO Take 2,000 mg by mouth 2 (two) times daily.   hydrocortisone  1 % ointment Apply 1 Application topically 2  (two) times daily. Apply to perineum   hydrocortisone  2.5 % ointment Apply 1 Application topically 2 (two) times daily as needed.   hydrOXYzine  (ATARAX ) 10 MG tablet Take 1 tablet (10 mg total) by mouth 2 (two) times daily.   hypromellose (GENTEAL SEVERE) 0.3 % GEL ophthalmic ointment Place 1 Application into both eyes as needed for dry eyes.   LORazepam  (ATIVAN ) 0.5 MG tablet Take 1 tablet (0.5 mg total) by mouth in the morning, at noon, in the evening, and at bedtime.   lubiprostone (AMITIZA) 8 MCG capsule Take 8 mcg by mouth 2 (two) times daily with a meal.   Menthol , Topical Analgesic, 4 % GEL Apply 1 application  topically as needed. Resident may self administer and keep in room   miconazole (MICOTIN) 2 % powder Apply topically as needed for itching. apply to legs and feet once a day   mirtazapine  (REMERON ) 45 MG tablet Take 1 tablet (45 mg total) by mouth at  bedtime.   Multiple Vitamin (MULTIVITAMIN WITH MINERALS) TABS Take 1 tablet by mouth daily.   Multiple Vitamins-Minerals (PRESERVISION AREDS 2+MULTI VIT PO) Take 1 tablet by mouth 2 (two) times daily.   NIFEdipine  (PROCARDIA  XL/ADALAT -CC) 60 MG 24 hr tablet TAKE 1 TABLET BY MOUTH EVERY DAY TO CONTROL BLOOD PRESSURE   pantoprazole  (PROTONIX ) 40 MG tablet One daily to reduce stomach acid   polyethylene glycol (MIRALAX  / GLYCOLAX ) 17 g packet Take 17 g by mouth daily. Mix 17g  with 8 oz. of fluid daily for constipation   pravastatin  (PRAVACHOL ) 10 MG tablet Take 10 mg by mouth daily.   No facility-administered encounter medications on file as of 01/29/2024.    Review of Systems  Constitutional:  Negative for fatigue and fever.  HENT:  Positive for rhinorrhea. Negative for congestion, ear pain and sore throat.   Respiratory:  Positive for cough and wheezing. Negative for shortness of breath.   Cardiovascular:  Negative for chest pain and leg swelling.  Gastrointestinal:  Negative for nausea and vomiting.  Musculoskeletal:  Negative for  myalgias.  Neurological:  Negative for headaches.  Psychiatric/Behavioral:  Positive for dysphoric mood. The patient is nervous/anxious.     Immunization History  Administered Date(s) Administered   Fluad Quad(high Dose 65+) 03/09/2022   INFLUENZA, HIGH DOSE SEASONAL PF 03/14/2017, 02/26/2019, 03/14/2023   Influenza Whole 02/14/2012, 02/14/2013   Influenza-Unspecified 02/27/2014, 02/12/2015, 02/25/2016, 03/15/2017, 02/19/2018, 02/18/2020, 03/09/2021   Moderna SARS-COV2 Booster Vaccination 10/21/2020   Moderna Sars-Covid-2 Vaccination 05/20/2019, 06/17/2019, 03/30/2020, 03/22/2022   PFIZER(Purple Top)SARS-COV-2 Vaccination 02/03/2021   Pneumococcal Conjugate-13 07/13/2015   Pneumococcal Polysaccharide-23 05/16/1998   Td 05/16/2004   Zoster Recombinant(Shingrix) 02/20/2018, 05/11/2018, 07/16/2018   Zoster, Live 05/16/2005   Pertinent  Health Maintenance Due  Topic Date Due   Influenza Vaccine  12/15/2023   DEXA SCAN  Completed   Mammogram  Discontinued      03/17/2022   11:51 AM 07/13/2022   11:31 AM 12/26/2022   10:48 AM 03/20/2023   12:58 PM 08/11/2023   11:25 AM  Fall Risk  Falls in the past year? 0 0 0 0 0  Was there an injury with Fall? 0 0 0 0 0  Fall Risk Category Calculator 0 0 0 0 0  Fall Risk Category (Retired) Low       (RETIRED) Patient Fall Risk Level Low fall risk       Patient at Risk for Falls Due to History of fall(s);Impaired balance/gait History of fall(s);Impaired balance/gait;Impaired mobility History of fall(s);Impaired balance/gait History of fall(s);Impaired balance/gait;Impaired mobility History of fall(s);Impaired mobility  Fall risk Follow up Falls evaluation completed  Falls evaluation completed;Education provided;Falls prevention discussed Falls evaluation completed;Education provided;Falls prevention discussed Falls evaluation completed;Education provided;Falls prevention discussed Falls evaluation completed;Education provided     Data saved with a  previous flowsheet row definition   Functional Status Survey:    Vitals:   01/29/24 1122  BP: 108/68  Pulse: 77  Resp: 20  Temp: 98.2 F (36.8 C)  SpO2: 96%  Weight: 141 lb (64 kg)  Height: 5' 2 (1.575 m)   Body mass index is 25.79 kg/m. Physical Exam Vitals reviewed.  Constitutional:      General: She is not in acute distress.    Appearance: She is not ill-appearing.  HENT:     Head: Normocephalic.     Right Ear: Tympanic membrane normal.     Left Ear: Tympanic membrane normal.     Nose: Rhinorrhea present.  Right Turbinates: Not enlarged.     Left Turbinates: Not enlarged.     Right Sinus: No maxillary sinus tenderness or frontal sinus tenderness.     Left Sinus: No maxillary sinus tenderness or frontal sinus tenderness.     Mouth/Throat:     Mouth: Mucous membranes are moist.     Pharynx: No posterior oropharyngeal erythema.  Eyes:     General:        Right eye: No discharge.        Left eye: No discharge.  Cardiovascular:     Rate and Rhythm: Normal rate and regular rhythm.     Pulses: Normal pulses.     Heart sounds: Murmur heard.  Pulmonary:     Effort: Pulmonary effort is normal. No respiratory distress.     Breath sounds: Wheezing present. No rhonchi or rales.     Comments: Expiratory wheezing to upper/middle lobes Abdominal:     General: Bowel sounds are normal.     Palpations: Abdomen is soft.  Musculoskeletal:        General: Normal range of motion.     Cervical back: Neck supple.  Lymphadenopathy:     Cervical: No cervical adenopathy.  Skin:    General: Skin is warm.     Capillary Refill: Capillary refill takes less than 2 seconds.  Neurological:     General: No focal deficit present.     Mental Status: She is alert and oriented to person, place, and time.     Motor: Weakness present.     Gait: Gait abnormal.  Psychiatric:        Mood and Affect: Mood normal.     Labs reviewed: No results for input(s): NA, K, CL, CO2,  GLUCOSE, BUN, CREATININE, CALCIUM , MG, PHOS in the last 8760 hours. No results for input(s): AST, ALT, ALKPHOS, BILITOT, PROT, ALBUMIN in the last 8760 hours. No results for input(s): WBC, NEUTROABS, HGB, HCT, MCV, PLT in the last 8760 hours. Lab Results  Component Value Date   TSH 2.10 03/08/2021   Lab Results  Component Value Date   HGBA1C 5.5 09/28/2018   Lab Results  Component Value Date   CHOL 139 12/27/2022   HDL 50 12/27/2022   LDLCALC 70 12/27/2022   TRIG 106 12/27/2022   CHOLHDL 3.5 08/04/2017    Significant Diagnostic Results in last 30 days:  No results found.  Assessment/Plan 1. Wheezing (Primary) - onset x 3 days - covid tests x 3 negative - expiratory wheezing to upper/middle lobes on exam - ? Allergies, asthma, bronchitis, CHF - start duonebs BID prn x 3 days> give 1st dose now - start prednisone  taper - no improvement, recommend CXR  2. Acute cough - see above     Family/ staff Communication: plan discussed with patient and nurse  Labs/tests ordered:  none

## 2024-01-30 DIAGNOSIS — R0989 Other specified symptoms and signs involving the circulatory and respiratory systems: Secondary | ICD-10-CM | POA: Diagnosis not present

## 2024-01-31 ENCOUNTER — Non-Acute Institutional Stay: Payer: Self-pay | Admitting: Orthopedic Surgery

## 2024-01-31 ENCOUNTER — Encounter: Payer: Self-pay | Admitting: Orthopedic Surgery

## 2024-01-31 DIAGNOSIS — F411 Generalized anxiety disorder: Secondary | ICD-10-CM

## 2024-01-31 DIAGNOSIS — R051 Acute cough: Secondary | ICD-10-CM | POA: Diagnosis not present

## 2024-01-31 MED ORDER — DOXYCYCLINE HYCLATE 100 MG PO TABS: 100.0000 mg | ORAL_TABLET | Freq: Two times a day (BID) | ORAL | Status: AC

## 2024-01-31 NOTE — Progress Notes (Signed)
 Location:  [facility] Room Number: 8/A Place of Service:  ALF [phone]) Provider:  Gil Greig BRAVO, NP    Patient Care Team: [name], MD as PCP - General (Internal Medicine) Devora, [facility] Octavia Charleston, MD as Consulting Physician (Ophthalmology) Teressa Toribio SQUIBB, MD (Inactive) as Consulting Physician (Gastroenterology) Tasia Lung, MD as Consulting Physician (Psychiatry) Lucilla Lynwood BRAVO, MD (Inactive) as Consulting Physician (Orthopedic Surgery) Gaston Hamilton, MD as Consulting Physician (Urology)  Extended Emergency Contact Information Primary Emergency Contact: [facility] Phone: [phone] Relation: Son Secondary Emergency Contact: Carollynn, [place], [name] [place] Home Phone: [phone] Mobile Phone: [phone] Relation: Son  Code Status:  Full Code Goals of care: Advanced Directive information    03/20/2023    9:56 AM  Advanced Directives  Does Patient Have a Medical Advance Directive? Yes  Type of Estate agent of [place];Living will  Does patient want to make changes to medical advance directive? No - Patient declined  Copy of Healthcare Power of Attorney in Chart? Yes - validated most recent copy scanned in chart (See row information)     Chief Complaint  Patient presents with   Acute Visit    Cough    HPI:  Pt is a 89 y.o. female seen today for acute visit due to ongoing cough/wheezing.   She currently resides on the assisted living unit at [facility]. PMH: HTN, HLD, GAD, major depression, left breast surgery 1969, oophorectomy 2002, cholecystectomy, chronic back pain, spinal stenosis, OA, osteoporosis.   09/15 she was started on prednisone  taper and duonebs due to increased coughing and wheezing. 09/16 wheezing improved, but she continued to cough. CXR ordered> noted mild CHF versus bronchitis versus PNA. She is very anxious today and asking to  go to hospital. Denies chest pain or sob. Afebrile. O2 sats > 90%. Weight unchanged within last 6 months per chart review.    Past Medical History:  Diagnosis Date   Abnormal mammogram, unspecified 04/19/2011   Allergy    Anxiety    Backache, unspecified 01/21/2005   Cervicalgia 08/20/2013   Chronic kidney disease, stage II (mild) 04/03/2009   Chronic rhinitis 09/28/2010   DDD (degenerative disc disease)    Depression    Dermatophytosis of nail 01/21/2005   Diverticulosis of colon (without mention of hemorrhage) 06/17/2005   Edema 11/26/2004   GERD (gastroesophageal reflux disease)    Hiatal hernia    High cholesterol    Hyperglycemia 11/18/2014   Hypertension    Insomnia, unspecified 10/18/2011   Left inguinal hernia 04/24/2014   Nocturia 09/19/2008   Osteoporosis, senile 11/18/2014   Other abnormal blood chemistry 04/03/2007   Pain in joint, pelvic region and thigh 11/12/2011   Pain in joint, shoulder region 08/15/2006   Pain in limb 08/26/2005   Palpitations 09/29/2009   PAOD (peripheral arterial occlusive disease) (HCC) 11/18/2014   Right leg; diminished popliteal, dorsalis pedis, and posterior tibial artery pulsations    Postmenopausal atrophic vaginitis 12/14/2004   Scoliosis    Senile osteoporosis 12/14/2004   Tension headache 10/04/2005   Unspecified cataract 11/26/2004   Unspecified constipation 01/21/2005   Unspecified urinary incontinence 11/26/2004   Vaginal stenosis 11/18/2014   Tight band about 1/2 inches into the vagina which will not allow passage of my index finger.    Past Surgical History:  Procedure Laterality Date   ABDOMINAL HYSTERECTOMY  1969   Dr.Braun  bilateral eyelid surgery  2005   Dr.Scott   BILATERAL OOPHORECTOMY  2002   BREAST SURGERY     benign tumor removal, left breast 1969   CATARACT EXTRACTION     Right   CHOLECYSTECTOMY  1990   Dr.Moore    COLONOSCOPY  3/18/20008   inflammation, diverticular associated colitis -Dr. Teressa   COSMETIC SURGERY      ESOPHAGOGASTRODUODENOSCOPY N/A 01/24/2014   Procedure: ESOPHAGOGASTRODUODENOSCOPY (EGD);  Surgeon: Norleen LOISE Kiang, MD;  Location: [facility] ENDOSCOPY;  Service: Endoscopy;  Laterality: N/A;   ESOPHAGOGASTRODUODENOSCOPY ENDOSCOPY  04/04/14   Dr. Teressa   EYE SURGERY     FOREIGN BODY REMOVAL N/A 01/24/2014   Procedure: FOREIGN BODY REMOVAL;  Surgeon: Norleen LOISE Kiang, MD;  Location: [facility] ENDOSCOPY;  Service: Endoscopy;  Laterality: N/A;   HIP PINNING,CANNULATED  11/07/2011   Procedure: CANNULATED HIP PINNING;  Surgeon: Lynwood FORBES Better, MD;  Location: [facility] ORS;  Service: Orthopedics;  Laterality: Left;   ORIF FEMORAL NECK FRACTURE W/ DHS  11/07/2011   Dr.Nitka    TONSILLECTOMY     Dr.Joe Claudene   TUBAL LIGATION     VAGINAL PROLAPSE REPAIR  2002   Dr.Horback     Allergies  Allergen Reactions   Brexpiprazole Swelling   Lamictal [Lamotrigine] Swelling    Lip swelling   Penicillins Other (See Comments)    CHILDHOOD REACTION   Abilify [Aripiprazole] Rash    Outpatient Encounter Medications as of 01/31/2024  Medication Sig   Acetylcysteine (NAC) 500 MG CAPS Take 600 mg by mouth daily.   antiseptic oral rinse (BIOTENE) LIQD 10 mLs by Mouth Rinse route as needed for dry mouth. Resident may self administer and keep in room   Artificial Tear Ointment (DRY EYES OP) Place 2 drops into both eyes in the morning, at noon, in the evening, and at bedtime.   aspirin  81 MG chewable tablet Chew 81 mg by mouth daily.   Bismuth Subsalicylate (PEPTO-BISMOL PO) Take 30 mg by mouth as needed. For diarrhea   buPROPion  (WELLBUTRIN  XL) 150 MG 24 hr tablet Take 150 mg by mouth daily.   Calcium  Carb-Cholecalciferol  600-800 MG-UNIT TABS Take 1 tablet by mouth 2 (two) times daily.   cholecalciferol  (VITAMIN D ) 1000 UNITS tablet Take 1,000 Units by mouth daily.   dextromethorphan-guaiFENesin  (ROBITUSSIN-DM) 10-100 MG/5ML liquid Take 10 mLs by mouth every 6 (six) hours as needed for cough. Give 10 ml by mouth every 6 hours as needed for  Cough for 2 Days Notify MD is continued cough.   docusate sodium  (COLACE) 100 MG capsule Take 1 capsule (100 mg total) by mouth every other day.   doxycycline  (ADOXA) 100 MG tablet Take 100 mg by mouth 2 (two) times daily.   escitalopram  (LEXAPRO ) 20 MG tablet Take 1 tablet (20 mg total) by mouth daily.   Eyelid Cleansers (OCUSOFT LID SCRUB PLUS) PADS Place 1 each into both eyes daily.   furosemide (LASIX) 20 MG tablet Take 20 mg by mouth as directed. Give 20mg  by mouth one time only for congestion for 1 Day   GLUCOSAMINE HCL PO Take 2,000 mg by mouth 2 (two) times daily.   hydrocortisone  1 % ointment Apply 1 Application topically 2 (two) times daily. Apply to perineum   hydrocortisone  2.5 % ointment Apply 1 Application topically 2 (two) times daily as needed.   hydrOXYzine  (ATARAX ) 10 MG tablet Take 1 tablet (10 mg total) by mouth 2 (two) times daily.   hypromellose (GENTEAL SEVERE) 0.3 % GEL  ophthalmic ointment Place 1 Application into both eyes as needed for dry eyes.   ipratropium-albuterol  (DUONEB) 0.5-2.5 (3) MG/3ML SOLN Take 3 mLs by nebulization 2 (two) times daily as needed for up to 3 days.   LORazepam  (ATIVAN ) 0.5 MG tablet Take 1 tablet (0.5 mg total) by mouth in the morning, at noon, in the evening, and at bedtime.   lubiprostone (AMITIZA) 8 MCG capsule Take 8 mcg by mouth 2 (two) times daily with a meal.   Menthol , Topical Analgesic, 4 % GEL Apply 1 application  topically as needed. Resident may self administer and keep in room   miconazole (MICOTIN) 2 % powder Apply topically as needed for itching. apply to legs and feet once a day   mirtazapine  (REMERON ) 45 MG tablet Take 1 tablet (45 mg total) by mouth at bedtime.   Multiple Vitamin (MULTIVITAMIN WITH MINERALS) TABS Take 1 tablet by mouth daily.   Multiple Vitamins-Minerals (PRESERVISION AREDS 2+MULTI VIT PO) Take 1 tablet by mouth 2 (two) times daily.   NIFEdipine  (PROCARDIA  XL/ADALAT -CC) 60 MG 24 hr tablet TAKE 1 TABLET BY  MOUTH EVERY DAY TO CONTROL BLOOD PRESSURE   pantoprazole  (PROTONIX ) 40 MG tablet One daily to reduce stomach acid   polyethylene glycol (MIRALAX  / GLYCOLAX ) 17 g packet Take 17 g by mouth daily. Mix 17g  with 8 oz. of fluid daily for constipation   pravastatin  (PRAVACHOL ) 10 MG tablet Take 10 mg by mouth daily.   predniSONE  (DELTASONE ) 20 MG tablet Take 2 tablets (40 mg total) by mouth daily with breakfast for 3 days, THEN 1 tablet (20 mg total) daily with breakfast for 4 days.   No facility-administered encounter medications on file as of 01/31/2024.    Review of Systems  Constitutional:  Negative for fatigue and fever.  HENT:  Negative for sore throat and trouble swallowing.   Respiratory:  Positive for cough and wheezing. Negative for shortness of breath.   Cardiovascular:  Negative for chest pain and leg swelling.  Gastrointestinal:  Negative for nausea and vomiting.  Musculoskeletal:  Positive for gait problem. Negative for myalgias.  Neurological:  Negative for dizziness and headaches.  Psychiatric/Behavioral:  Positive for dysphoric mood. Negative for confusion and sleep disturbance. The patient is nervous/anxious.     Immunization History  Administered Date(s) Administered   Fluad Quad(high Dose 65+) 03/09/2022, 03/14/2023   INFLUENZA, HIGH DOSE SEASONAL PF 03/14/2017, 02/26/2019, 03/14/2023   Influenza Whole 02/14/2012, 02/14/2013   Influenza-Unspecified 02/27/2014, 02/12/2015, 02/25/2016, 03/15/2017, 02/19/2018, 02/18/2020, 03/09/2021   Moderna SARS-COV2 Booster Vaccination 10/21/2020   Moderna Sars-Covid-2 Vaccination 05/20/2019, 06/17/2019, 03/30/2020, 03/22/2022   PFIZER(Purple Top)SARS-COV-2 Vaccination 02/03/2021   Pneumococcal Conjugate-13 07/13/2015   Pneumococcal Polysaccharide-23 05/16/1998   Td 05/16/2004   Unspecified SARS-COV-2 Vaccination 03/22/2023   Zoster Recombinant(Shingrix) 02/20/2018, 05/11/2018, 07/16/2018   Zoster, Live 05/16/2005   Pertinent  Health  Maintenance Due  Topic Date Due   Influenza Vaccine  12/15/2023   DEXA SCAN  Completed   Mammogram  Discontinued      07/13/2022   11:31 AM 12/26/2022   10:48 AM 03/20/2023   12:58 PM 08/11/2023   11:25 AM 01/29/2024   11:39 AM  Fall Risk  Falls in the past year? 0 0 0 0 0  Was there an injury with Fall? 0 0 0 0 0  Fall Risk Category Calculator 0 0 0 0 0  Patient at Risk for Falls Due to History of fall(s);Impaired balance/gait;Impaired mobility History of fall(s);Impaired balance/gait History of fall(s);Impaired balance/gait;Impaired  mobility History of fall(s);Impaired mobility History of fall(s)  Fall risk Follow up Falls evaluation completed;Education provided;Falls prevention discussed Falls evaluation completed;Education provided;Falls prevention discussed Falls evaluation completed;Education provided;Falls prevention discussed Falls evaluation completed;Education provided Falls evaluation completed   Functional Status Survey:    Vitals:   01/31/24 1305  BP: 108/68  Pulse: 77  Resp: 20  Temp: 98.2 F (36.8 C)  SpO2: 96%  Weight: 141 lb (64 kg)  Height: 5' 2 (1.575 m)   Body mass index is 25.79 kg/m. Physical Exam Vitals reviewed.  Constitutional:      General: She is not in acute distress.    Appearance: She is not ill-appearing.  HENT:     Head: Normocephalic.     Right Ear: Tympanic membrane normal.     Left Ear: Tympanic membrane normal.     Nose: Rhinorrhea present.     Mouth/Throat:     Mouth: Mucous membranes are moist.  Eyes:     General:        Right eye: No discharge.        Left eye: No discharge.  Cardiovascular:     Rate and Rhythm: Normal rate and regular rhythm.     Pulses: Normal pulses.     Heart sounds: Normal heart sounds.  Pulmonary:     Effort: Pulmonary effort is normal. No respiratory distress.     Breath sounds: Wheezing present. No rhonchi or rales.     Comments: Expiratory  Abdominal:     General: Bowel sounds are normal.      Palpations: Abdomen is soft.  Musculoskeletal:     Cervical back: Neck supple.     Right lower leg: Edema present.     Left lower leg: Edema present.     Comments: Non pitting  Lymphadenopathy:     Cervical: No cervical adenopathy.  Skin:    General: Skin is warm.     Capillary Refill: Capillary refill takes less than 2 seconds.  Neurological:     General: No focal deficit present.     Mental Status: She is alert and oriented to person, place, and time.     Gait: Gait abnormal.  Psychiatric:        Mood and Affect: Mood is anxious.     Labs reviewed: Recent Labs    07/31/23 0000  NA 139  K 4.2  CL 101  CO2 31*  BUN 21  CREATININE 0.9  CALCIUM  9.7   Recent Labs    07/31/23 0000  AST 14  ALT 8  ALKPHOS 67  ALBUMIN 3.9   Recent Labs    07/31/23 0000  WBC 5.4  HGB 11.8*  HCT 37  PLT 163   Lab Results  Component Value Date   TSH 2.22 07/31/2023   Lab Results  Component Value Date   HGBA1C 5.5 09/28/2018   Lab Results  Component Value Date   CHOL 141 07/31/2023   HDL 46 07/31/2023   LDLCALC 76 07/31/2023   TRIG 108 07/31/2023   CHOLHDL 3.5 08/04/2017    Significant Diagnostic Results in last 30 days:  No results found.  Assessment/Plan 1. Acute cough (Primary) - onset 09/06 - 09/17 CXR noted mild CHF versus bronchitis versus PNA - cont prednisone  taper and duonebs - give furosemide 20 mg po once - will start doxycycline  for URI/PNA coverage - no weight gain or pitting edema - BMP/ BNP 09/18  2. GAD (generalized anxiety disorder) - see above - if anxiety  worsens consider stopping prednisone   - cont Lexapro  and lorazepam    Family/ staff Communication: plan discussed with patient and nurse  Labs/tests ordered: BNP/BMP 09/18

## 2024-02-01 DIAGNOSIS — R0602 Shortness of breath: Secondary | ICD-10-CM | POA: Diagnosis not present

## 2024-02-01 DIAGNOSIS — I10 Essential (primary) hypertension: Secondary | ICD-10-CM | POA: Diagnosis not present

## 2024-02-06 ENCOUNTER — Other Ambulatory Visit: Payer: Self-pay | Admitting: Adult Health

## 2024-02-06 DIAGNOSIS — F411 Generalized anxiety disorder: Secondary | ICD-10-CM

## 2024-02-06 MED ORDER — LORAZEPAM 0.5 MG PO TABS: 0.5000 mg | ORAL_TABLET | Freq: Every day | ORAL | 0 refills | Status: DC | PRN

## 2024-02-13 ENCOUNTER — Other Ambulatory Visit: Payer: Self-pay | Admitting: Adult Health

## 2024-02-13 DIAGNOSIS — R278 Other lack of coordination: Secondary | ICD-10-CM | POA: Diagnosis not present

## 2024-02-13 DIAGNOSIS — M6281 Muscle weakness (generalized): Secondary | ICD-10-CM | POA: Diagnosis not present

## 2024-02-13 DIAGNOSIS — F419 Anxiety disorder, unspecified: Secondary | ICD-10-CM | POA: Diagnosis not present

## 2024-02-13 DIAGNOSIS — F411 Generalized anxiety disorder: Secondary | ICD-10-CM

## 2024-02-13 MED ORDER — LORAZEPAM 0.5 MG PO TABS
0.5000 mg | ORAL_TABLET | Freq: Four times a day (QID) | ORAL | 0 refills | Status: DC | PRN
Start: 2024-02-13 — End: 2024-02-15

## 2024-02-14 DIAGNOSIS — R278 Other lack of coordination: Secondary | ICD-10-CM | POA: Diagnosis not present

## 2024-02-14 DIAGNOSIS — F419 Anxiety disorder, unspecified: Secondary | ICD-10-CM | POA: Diagnosis not present

## 2024-02-14 DIAGNOSIS — M6281 Muscle weakness (generalized): Secondary | ICD-10-CM | POA: Diagnosis not present

## 2024-02-15 ENCOUNTER — Encounter: Payer: Self-pay | Admitting: Internal Medicine

## 2024-02-15 ENCOUNTER — Non-Acute Institutional Stay: Admitting: Internal Medicine

## 2024-02-15 DIAGNOSIS — F411 Generalized anxiety disorder: Secondary | ICD-10-CM | POA: Diagnosis not present

## 2024-02-15 DIAGNOSIS — R051 Acute cough: Secondary | ICD-10-CM

## 2024-02-15 DIAGNOSIS — I10 Essential (primary) hypertension: Secondary | ICD-10-CM

## 2024-02-15 DIAGNOSIS — E785 Hyperlipidemia, unspecified: Secondary | ICD-10-CM

## 2024-02-15 DIAGNOSIS — K219 Gastro-esophageal reflux disease without esophagitis: Secondary | ICD-10-CM

## 2024-02-15 MED ORDER — LORAZEPAM 0.5 MG PO TABS: 0.5000 mg | ORAL_TABLET | Freq: Four times a day (QID) | ORAL | 0 refills | Status: DC

## 2024-02-15 NOTE — Progress Notes (Signed)
 Location:  [facility] of Service:  ALF ([id number])  Provider:   Code Status: Full Code Goals of Care:     03/20/2023    9:56 AM  Advanced Directives  Does Patient Have a Medical Advance Directive? Yes  Type of Estate agent of [place];Living will  Does patient want to make changes to medical advance directive? No - Patient declined  Copy of Healthcare Power of Attorney in Chart? Yes - validated most recent copy scanned in chart (See row information)     Chief Complaint  Patient presents with   Care Management    HPI: Patient is a 89 y.o. female seen today for medical management of chronic diseases.    Lives in [place]   Patient has h/o Major Depression with Anxiety, Hyperlipidemia, Chronic Bilateral Low Back Pain, Spinal Stenosis , Osteoarthritis, history of osteoporosis, hypertension Dizziness Per Neurology No more work up  Recently treated for Pneumonia With Prednisone  and Doxycyline She says her cough is much better.  She is not short of breath no fever or  She was very anxious today.  But otherwise staying stable she follows with psychiatrist.  Stays in the room due to anxiety. Walks with her walker  Weight is stable Past Medical History:  Diagnosis Date   Abnormal mammogram, unspecified 04/19/2011   Allergy    Anxiety    Backache, unspecified 01/21/2005   Cervicalgia 08/20/2013   Chronic kidney disease, stage II (mild) 04/03/2009   Chronic rhinitis 09/28/2010   DDD (degenerative disc disease)    Depression    Dermatophytosis of nail 01/21/2005   Diverticulosis of colon (without mention of hemorrhage) 06/17/2005   Edema 11/26/2004   GERD (gastroesophageal reflux disease)    Hiatal hernia    High cholesterol    Hyperglycemia 11/18/2014   Hypertension    Insomnia, unspecified 10/18/2011   Left inguinal hernia 04/24/2014   Nocturia 09/19/2008   Osteoporosis, senile 11/18/2014   Other abnormal blood chemistry 04/03/2007   Pain in joint, pelvic  region and thigh 11/12/2011   Pain in joint, shoulder region 08/15/2006   Pain in limb 08/26/2005   Palpitations 09/29/2009   PAOD (peripheral arterial occlusive disease) 11/18/2014   Right leg; diminished popliteal, dorsalis pedis, and posterior tibial artery pulsations    Postmenopausal atrophic vaginitis 12/14/2004   Scoliosis    Senile osteoporosis 12/14/2004   Tension headache 10/04/2005   Unspecified cataract 11/26/2004   Unspecified constipation 01/21/2005   Unspecified urinary incontinence 11/26/2004   Vaginal stenosis 11/18/2014   Tight band about 1/2 inches into the vagina which will not allow passage of my index finger.     Past Surgical History:  Procedure Laterality Date   ABDOMINAL HYSTERECTOMY  1969   Dr.Braun    bilateral eyelid surgery  2005   Dr.Scott   BILATERAL OOPHORECTOMY  2002   BREAST SURGERY     benign tumor removal, left breast 1969   CATARACT EXTRACTION     Right   CHOLECYSTECTOMY  1990   Dr.Moore    COLONOSCOPY  3/18/20008   inflammation, diverticular associated colitis -Dr. Teressa   COSMETIC SURGERY     ESOPHAGOGASTRODUODENOSCOPY N/A 01/24/2014   Procedure: ESOPHAGOGASTRODUODENOSCOPY (EGD);  Surgeon: Norleen LOISE Kiang, MD;  Location: [facility] ENDOSCOPY;  Service: Endoscopy;  Laterality: N/A;   ESOPHAGOGASTRODUODENOSCOPY ENDOSCOPY  04/04/14   Dr. Teressa   EYE SURGERY     FOREIGN BODY REMOVAL N/A 01/24/2014   Procedure: FOREIGN BODY REMOVAL;  Surgeon:  Norleen LOISE Kiang, MD;  Location: [facility] ENDOSCOPY;  Service: Endoscopy;  Laterality: N/A;   HIP PINNING,CANNULATED  11/07/2011   Procedure: CANNULATED HIP PINNING;  Surgeon: Lynwood FORBES Better, MD;  Location: [facility] ORS;  Service: Orthopedics;  Laterality: Left;   ORIF FEMORAL NECK FRACTURE W/ DHS  11/07/2011   Dr.Nitka    TONSILLECTOMY     Dr.Joe Claudene   TUBAL LIGATION     VAGINAL PROLAPSE REPAIR  2002   Dr.Horback     Allergies  Allergen Reactions   Brexpiprazole Swelling   Lamictal [Lamotrigine] Swelling    Lip swelling    Penicillins Other (See Comments)    CHILDHOOD REACTION   Abilify [Aripiprazole] Rash    Outpatient Encounter Medications as of 02/15/2024  Medication Sig   Acetylcysteine (NAC) 500 MG CAPS Take 600 mg by mouth daily.   antiseptic oral rinse (BIOTENE) LIQD 10 mLs by Mouth Rinse route as needed for dry mouth. Resident may self administer and keep in room   Artificial Tear Ointment (DRY EYES OP) Place 2 drops into both eyes in the morning, at noon, in the evening, and at bedtime.   aspirin  81 MG chewable tablet Chew 81 mg by mouth daily.   Bismuth Subsalicylate (PEPTO-BISMOL PO) Take 30 mg by mouth as needed. For diarrhea   buPROPion  (WELLBUTRIN  XL) 150 MG 24 hr tablet Take 150 mg by mouth daily.   Calcium  Carb-Cholecalciferol  600-800 MG-UNIT TABS Take 1 tablet by mouth 2 (two) times daily.   cholecalciferol  (VITAMIN D ) 1000 UNITS tablet Take 1,000 Units by mouth daily.   dextromethorphan-guaiFENesin  (ROBITUSSIN-DM) 10-100 MG/5ML liquid Take 10 mLs by mouth every 6 (six) hours as needed for cough. Give 10 ml by mouth every 6 hours as needed for Cough for 2 Days Notify MD is continued cough.   docusate sodium  (COLACE) 100 MG capsule Take 1 capsule (100 mg total) by mouth every other day.   doxycycline  (ADOXA) 100 MG tablet Take 100 mg by mouth 2 (two) times daily.   escitalopram  (LEXAPRO ) 20 MG tablet Take 1 tablet (20 mg total) by mouth daily.   Eyelid Cleansers (OCUSOFT LID SCRUB PLUS) PADS Place 1 each into both eyes daily.   furosemide (LASIX) 20 MG tablet Take 20 mg by mouth as directed. Give 20mg  by mouth one time only for congestion for 1 Day   GLUCOSAMINE HCL PO Take 2,000 mg by mouth 2 (two) times daily.   hydrocortisone  1 % ointment Apply 1 Application topically 2 (two) times daily. Apply to perineum   hydrocortisone  2.5 % ointment Apply 1 Application topically 2 (two) times daily as needed.   hydrOXYzine  (ATARAX ) 10 MG tablet Take 1 tablet (10 mg total) by mouth 2 (two) times daily.    hypromellose (GENTEAL SEVERE) 0.3 % GEL ophthalmic ointment Place 1 Application into both eyes as needed for dry eyes.   ipratropium-albuterol  (DUONEB) 0.5-2.5 (3) MG/3ML SOLN Take 3 mLs by nebulization 2 (two) times daily as needed for up to 3 days.   LORazepam  (ATIVAN ) 0.5 MG tablet Take 1 tablet (0.5 mg total) by mouth 4 (four) times daily.   lubiprostone (AMITIZA) 8 MCG capsule Take 8 mcg by mouth 2 (two) times daily with a meal.   Menthol , Topical Analgesic, 4 % GEL Apply 1 application  topically as needed. Resident may self administer and keep in room   miconazole (MICOTIN) 2 % powder Apply topically as needed for itching. apply to legs and feet once a day   mirtazapine  (  REMERON ) 45 MG tablet Take 1 tablet (45 mg total) by mouth at bedtime.   Multiple Vitamin (MULTIVITAMIN WITH MINERALS) TABS Take 1 tablet by mouth daily.   Multiple Vitamins-Minerals (PRESERVISION AREDS 2+MULTI VIT PO) Take 1 tablet by mouth 2 (two) times daily.   NIFEdipine  (PROCARDIA  XL/ADALAT -CC) 60 MG 24 hr tablet TAKE 1 TABLET BY MOUTH EVERY DAY TO CONTROL BLOOD PRESSURE   pantoprazole  (PROTONIX ) 40 MG tablet One daily to reduce stomach acid   polyethylene glycol (MIRALAX  / GLYCOLAX ) 17 g packet Take 17 g by mouth daily. Mix 17g  with 8 oz. of fluid daily for constipation   pravastatin  (PRAVACHOL ) 10 MG tablet Take 10 mg by mouth daily.   [DISCONTINUED] LORazepam  (ATIVAN ) 0.5 MG tablet Take 1 tablet (0.5 mg total) by mouth 4 (four) times daily as needed for anxiety.   No facility-administered encounter medications on file as of 02/15/2024.    Review of Systems:  Review of Systems  Constitutional:  Negative for activity change and appetite change.  HENT: Negative.    Respiratory:  Negative for cough and shortness of breath.   Cardiovascular:  Negative for leg swelling.  Gastrointestinal:  Negative for constipation.  Genitourinary: Negative.   Musculoskeletal:  Positive for gait problem. Negative for arthralgias and  myalgias.  Skin: Negative.   Neurological:  Negative for dizziness and weakness.  Psychiatric/Behavioral:  Positive for dysphoric mood. Negative for confusion and sleep disturbance. The patient is nervous/anxious.     Health Maintenance  Topic Date Due   Influenza Vaccine  12/15/2023   COVID-19 Vaccine (8 - 2024-25 season) 01/15/2024   Medicare Annual Wellness (AWV)  08/10/2024   Pneumococcal Vaccine: 50+ Years  Completed   DEXA SCAN  Completed   Zoster Vaccines- Shingrix  Completed   HPV VACCINES  Aged Out   Meningococcal B Vaccine  Aged Out   DTaP/Tdap/Td  Discontinued   Mammogram  Discontinued    Physical Exam: Vitals:   02/15/24 1152  BP: 134/74  Pulse: 85  Resp: 18  Temp: (!) 97.4 F (36.3 C)  SpO2: 97%  Weight: 141 lb (64 kg)   Body mass index is 25.79 kg/m. Physical Exam Vitals reviewed.  Constitutional:      Appearance: Normal appearance.  HENT:     Head: Normocephalic.     Nose: Nose normal.     Mouth/Throat:     Mouth: Mucous membranes are moist.     Pharynx: Oropharynx is clear.  Eyes:     Pupils: Pupils are equal, round, and reactive to light.  Cardiovascular:     Rate and Rhythm: Normal rate and regular rhythm.     Pulses: Normal pulses.     Heart sounds: Normal heart sounds. No murmur heard. Pulmonary:     Effort: Pulmonary effort is normal.     Breath sounds: Normal breath sounds.     Comments: Few rale sin her Left Lower Lung Abdominal:     General: Abdomen is flat. Bowel sounds are normal.     Palpations: Abdomen is soft.  Musculoskeletal:        General: No swelling.     Cervical back: Neck supple.  Skin:    General: Skin is warm.  Neurological:     General: No focal deficit present.     Mental Status: She is alert and oriented to person, place, and time.  Psychiatric:        Mood and Affect: Mood normal.  Thought Content: Thought content normal.     Labs reviewed: Basic Metabolic Panel: Recent Labs    07/31/23 0000   NA 139  K 4.2  CL 101  CO2 31*  BUN 21  CREATININE 0.9  CALCIUM  9.7  TSH 2.22   Liver Function Tests: Recent Labs    07/31/23 0000  AST 14  ALT 8  ALKPHOS 67  ALBUMIN 3.9   No results for input(s): LIPASE, AMYLASE in the last 8760 hours. No results for input(s): AMMONIA in the last 8760 hours. CBC: Recent Labs    07/31/23 0000  WBC 5.4  HGB 11.8*  HCT 37  PLT 163   Lipid Panel: Recent Labs    07/31/23 0000  CHOL 141  HDL 46  LDLCALC 76  TRIG 108   Lab Results  Component Value Date   HGBA1C 5.5 09/28/2018    Procedures since last visit: No results found.  Assessment/Plan 1. GAD (generalized anxiety disorder)  - LORazepam  (ATIVAN ) 0.5 MG tablet; Take 1 tablet (0.5 mg total) by mouth 4 (four) times daily.  Dispense: 120 tablet; Refill: 0  2. Acute cough (Primary) Was treated for Pneumonia Feeling much Better BNP was normal  3. Major depression, recurrent, chronic Continues to be her issue Wellbutrin , Lexapro , Ativan  and Atarax  Remeron  Follows with Psych  4. Primary hypertension Procardia   5. Hyperlipidemia LDL goal <100 statin LDL 76 6. Gastroesophageal reflux disease without esophagitis Protonix  7 Constipation On Amitiza and Miralax    Labs/tests ordered:  * No order type specified * Next appt:  Visit date not found

## 2024-02-16 DIAGNOSIS — R278 Other lack of coordination: Secondary | ICD-10-CM | POA: Diagnosis not present

## 2024-02-16 DIAGNOSIS — F419 Anxiety disorder, unspecified: Secondary | ICD-10-CM | POA: Diagnosis not present

## 2024-02-16 DIAGNOSIS — M6281 Muscle weakness (generalized): Secondary | ICD-10-CM | POA: Diagnosis not present

## 2024-02-19 DIAGNOSIS — R278 Other lack of coordination: Secondary | ICD-10-CM | POA: Diagnosis not present

## 2024-02-19 DIAGNOSIS — M6281 Muscle weakness (generalized): Secondary | ICD-10-CM | POA: Diagnosis not present

## 2024-02-19 DIAGNOSIS — F419 Anxiety disorder, unspecified: Secondary | ICD-10-CM | POA: Diagnosis not present

## 2024-02-20 DIAGNOSIS — M6281 Muscle weakness (generalized): Secondary | ICD-10-CM | POA: Diagnosis not present

## 2024-02-20 DIAGNOSIS — F419 Anxiety disorder, unspecified: Secondary | ICD-10-CM | POA: Diagnosis not present

## 2024-02-20 DIAGNOSIS — R278 Other lack of coordination: Secondary | ICD-10-CM | POA: Diagnosis not present

## 2024-02-23 DIAGNOSIS — R278 Other lack of coordination: Secondary | ICD-10-CM | POA: Diagnosis not present

## 2024-02-23 DIAGNOSIS — M6281 Muscle weakness (generalized): Secondary | ICD-10-CM | POA: Diagnosis not present

## 2024-02-23 DIAGNOSIS — F419 Anxiety disorder, unspecified: Secondary | ICD-10-CM | POA: Diagnosis not present

## 2024-02-26 DIAGNOSIS — M6281 Muscle weakness (generalized): Secondary | ICD-10-CM | POA: Diagnosis not present

## 2024-02-26 DIAGNOSIS — F419 Anxiety disorder, unspecified: Secondary | ICD-10-CM | POA: Diagnosis not present

## 2024-02-26 DIAGNOSIS — R278 Other lack of coordination: Secondary | ICD-10-CM | POA: Diagnosis not present

## 2024-02-27 DIAGNOSIS — R278 Other lack of coordination: Secondary | ICD-10-CM | POA: Diagnosis not present

## 2024-02-27 DIAGNOSIS — F419 Anxiety disorder, unspecified: Secondary | ICD-10-CM | POA: Diagnosis not present

## 2024-02-27 DIAGNOSIS — M6281 Muscle weakness (generalized): Secondary | ICD-10-CM | POA: Diagnosis not present

## 2024-03-01 DIAGNOSIS — M6281 Muscle weakness (generalized): Secondary | ICD-10-CM | POA: Diagnosis not present

## 2024-03-01 DIAGNOSIS — F419 Anxiety disorder, unspecified: Secondary | ICD-10-CM | POA: Diagnosis not present

## 2024-03-01 DIAGNOSIS — R278 Other lack of coordination: Secondary | ICD-10-CM | POA: Diagnosis not present

## 2024-03-04 DIAGNOSIS — R278 Other lack of coordination: Secondary | ICD-10-CM | POA: Diagnosis not present

## 2024-03-04 DIAGNOSIS — M6281 Muscle weakness (generalized): Secondary | ICD-10-CM | POA: Diagnosis not present

## 2024-03-04 DIAGNOSIS — F419 Anxiety disorder, unspecified: Secondary | ICD-10-CM | POA: Diagnosis not present

## 2024-03-05 DIAGNOSIS — R278 Other lack of coordination: Secondary | ICD-10-CM | POA: Diagnosis not present

## 2024-03-05 DIAGNOSIS — M6281 Muscle weakness (generalized): Secondary | ICD-10-CM | POA: Diagnosis not present

## 2024-03-05 DIAGNOSIS — F419 Anxiety disorder, unspecified: Secondary | ICD-10-CM | POA: Diagnosis not present

## 2024-03-07 DIAGNOSIS — M6281 Muscle weakness (generalized): Secondary | ICD-10-CM | POA: Diagnosis not present

## 2024-03-07 DIAGNOSIS — R278 Other lack of coordination: Secondary | ICD-10-CM | POA: Diagnosis not present

## 2024-03-07 DIAGNOSIS — F419 Anxiety disorder, unspecified: Secondary | ICD-10-CM | POA: Diagnosis not present

## 2024-03-11 DIAGNOSIS — R278 Other lack of coordination: Secondary | ICD-10-CM | POA: Diagnosis not present

## 2024-03-11 DIAGNOSIS — F419 Anxiety disorder, unspecified: Secondary | ICD-10-CM | POA: Diagnosis not present

## 2024-03-11 DIAGNOSIS — M6281 Muscle weakness (generalized): Secondary | ICD-10-CM | POA: Diagnosis not present

## 2024-03-12 DIAGNOSIS — Z23 Encounter for immunization: Secondary | ICD-10-CM | POA: Diagnosis not present

## 2024-03-13 DIAGNOSIS — M6281 Muscle weakness (generalized): Secondary | ICD-10-CM | POA: Diagnosis not present

## 2024-03-13 DIAGNOSIS — R278 Other lack of coordination: Secondary | ICD-10-CM | POA: Diagnosis not present

## 2024-03-13 DIAGNOSIS — F419 Anxiety disorder, unspecified: Secondary | ICD-10-CM | POA: Diagnosis not present

## 2024-03-14 DIAGNOSIS — M6281 Muscle weakness (generalized): Secondary | ICD-10-CM | POA: Diagnosis not present

## 2024-03-14 DIAGNOSIS — R278 Other lack of coordination: Secondary | ICD-10-CM | POA: Diagnosis not present

## 2024-03-14 DIAGNOSIS — F419 Anxiety disorder, unspecified: Secondary | ICD-10-CM | POA: Diagnosis not present

## 2024-03-15 ENCOUNTER — Other Ambulatory Visit: Payer: Self-pay | Admitting: Internal Medicine

## 2024-03-15 DIAGNOSIS — F411 Generalized anxiety disorder: Secondary | ICD-10-CM

## 2024-03-15 MED ORDER — LORAZEPAM 0.5 MG PO TABS: 0.5000 mg | ORAL_TABLET | Freq: Four times a day (QID) | ORAL | 0 refills | Status: DC

## 2024-03-18 DIAGNOSIS — R278 Other lack of coordination: Secondary | ICD-10-CM | POA: Diagnosis not present

## 2024-03-18 DIAGNOSIS — M6281 Muscle weakness (generalized): Secondary | ICD-10-CM | POA: Diagnosis not present

## 2024-03-18 DIAGNOSIS — F419 Anxiety disorder, unspecified: Secondary | ICD-10-CM | POA: Diagnosis not present

## 2024-03-19 DIAGNOSIS — F419 Anxiety disorder, unspecified: Secondary | ICD-10-CM | POA: Diagnosis not present

## 2024-03-19 DIAGNOSIS — M79671 Pain in right foot: Secondary | ICD-10-CM | POA: Diagnosis not present

## 2024-03-19 DIAGNOSIS — M6281 Muscle weakness (generalized): Secondary | ICD-10-CM | POA: Diagnosis not present

## 2024-03-19 DIAGNOSIS — M79672 Pain in left foot: Secondary | ICD-10-CM | POA: Diagnosis not present

## 2024-03-19 DIAGNOSIS — R278 Other lack of coordination: Secondary | ICD-10-CM | POA: Diagnosis not present

## 2024-03-19 DIAGNOSIS — B351 Tinea unguium: Secondary | ICD-10-CM | POA: Diagnosis not present

## 2024-03-22 DIAGNOSIS — M6281 Muscle weakness (generalized): Secondary | ICD-10-CM | POA: Diagnosis not present

## 2024-03-22 DIAGNOSIS — R278 Other lack of coordination: Secondary | ICD-10-CM | POA: Diagnosis not present

## 2024-03-22 DIAGNOSIS — F419 Anxiety disorder, unspecified: Secondary | ICD-10-CM | POA: Diagnosis not present

## 2024-03-25 DIAGNOSIS — R278 Other lack of coordination: Secondary | ICD-10-CM | POA: Diagnosis not present

## 2024-03-25 DIAGNOSIS — M6281 Muscle weakness (generalized): Secondary | ICD-10-CM | POA: Diagnosis not present

## 2024-03-25 DIAGNOSIS — F419 Anxiety disorder, unspecified: Secondary | ICD-10-CM | POA: Diagnosis not present

## 2024-03-27 DIAGNOSIS — R278 Other lack of coordination: Secondary | ICD-10-CM | POA: Diagnosis not present

## 2024-03-27 DIAGNOSIS — F419 Anxiety disorder, unspecified: Secondary | ICD-10-CM | POA: Diagnosis not present

## 2024-03-27 DIAGNOSIS — M6281 Muscle weakness (generalized): Secondary | ICD-10-CM | POA: Diagnosis not present

## 2024-03-29 DIAGNOSIS — M6281 Muscle weakness (generalized): Secondary | ICD-10-CM | POA: Diagnosis not present

## 2024-03-29 DIAGNOSIS — R278 Other lack of coordination: Secondary | ICD-10-CM | POA: Diagnosis not present

## 2024-03-29 DIAGNOSIS — F419 Anxiety disorder, unspecified: Secondary | ICD-10-CM | POA: Diagnosis not present

## 2024-04-03 DIAGNOSIS — R278 Other lack of coordination: Secondary | ICD-10-CM | POA: Diagnosis not present

## 2024-04-03 DIAGNOSIS — F419 Anxiety disorder, unspecified: Secondary | ICD-10-CM | POA: Diagnosis not present

## 2024-04-03 DIAGNOSIS — M6281 Muscle weakness (generalized): Secondary | ICD-10-CM | POA: Diagnosis not present

## 2024-04-05 DIAGNOSIS — R278 Other lack of coordination: Secondary | ICD-10-CM | POA: Diagnosis not present

## 2024-04-05 DIAGNOSIS — M6281 Muscle weakness (generalized): Secondary | ICD-10-CM | POA: Diagnosis not present

## 2024-04-05 DIAGNOSIS — F419 Anxiety disorder, unspecified: Secondary | ICD-10-CM | POA: Diagnosis not present

## 2024-04-09 DIAGNOSIS — R278 Other lack of coordination: Secondary | ICD-10-CM | POA: Diagnosis not present

## 2024-04-09 DIAGNOSIS — M6281 Muscle weakness (generalized): Secondary | ICD-10-CM | POA: Diagnosis not present

## 2024-04-09 DIAGNOSIS — F419 Anxiety disorder, unspecified: Secondary | ICD-10-CM | POA: Diagnosis not present

## 2024-04-10 DIAGNOSIS — R278 Other lack of coordination: Secondary | ICD-10-CM | POA: Diagnosis not present

## 2024-04-10 DIAGNOSIS — M6281 Muscle weakness (generalized): Secondary | ICD-10-CM | POA: Diagnosis not present

## 2024-04-10 DIAGNOSIS — F419 Anxiety disorder, unspecified: Secondary | ICD-10-CM | POA: Diagnosis not present

## 2024-04-15 ENCOUNTER — Other Ambulatory Visit: Payer: Self-pay | Admitting: Internal Medicine

## 2024-04-15 DIAGNOSIS — F411 Generalized anxiety disorder: Secondary | ICD-10-CM

## 2024-04-15 NOTE — Telephone Encounter (Signed)
 Assisted living resident

## 2024-04-16 ENCOUNTER — Other Ambulatory Visit: Payer: Self-pay | Admitting: Internal Medicine

## 2024-04-16 ENCOUNTER — Other Ambulatory Visit: Payer: Self-pay | Admitting: Nurse Practitioner

## 2024-04-16 DIAGNOSIS — F411 Generalized anxiety disorder: Secondary | ICD-10-CM

## 2024-04-16 MED ORDER — LORAZEPAM 0.5 MG PO TABS: 0.5000 mg | ORAL_TABLET | Freq: Four times a day (QID) | ORAL | 0 refills | Status: DC

## 2024-05-15 ENCOUNTER — Other Ambulatory Visit: Payer: Self-pay | Admitting: Nurse Practitioner

## 2024-05-15 DIAGNOSIS — F411 Generalized anxiety disorder: Secondary | ICD-10-CM

## 2024-05-15 MED ORDER — LORAZEPAM 0.5 MG PO TABS: 0.5000 mg | ORAL_TABLET | Freq: Four times a day (QID) | ORAL | 2 refills | Status: DC

## 2024-05-26 ENCOUNTER — Inpatient Hospital Stay (HOSPITAL_COMMUNITY)
Admission: EM | Admit: 2024-05-26 | Discharge: 2024-05-30 | DRG: 682 | Disposition: A | Discharge status: Nursing Home (Includes ICF) | Source: Skilled Nursing Facility | Attending: Internal Medicine | Admitting: Internal Medicine

## 2024-05-26 ENCOUNTER — Telehealth: Payer: Self-pay | Admitting: Family

## 2024-05-26 ENCOUNTER — Emergency Department (HOSPITAL_COMMUNITY)

## 2024-05-26 ENCOUNTER — Other Ambulatory Visit: Payer: Self-pay

## 2024-05-26 DIAGNOSIS — R531 Weakness: Secondary | ICD-10-CM

## 2024-05-26 DIAGNOSIS — Z9049 Acquired absence of other specified parts of digestive tract: Secondary | ICD-10-CM

## 2024-05-26 DIAGNOSIS — E871 Hypo-osmolality and hyponatremia: Secondary | ICD-10-CM | POA: Diagnosis present

## 2024-05-26 DIAGNOSIS — G4700 Insomnia, unspecified: Secondary | ICD-10-CM | POA: Diagnosis present

## 2024-05-26 DIAGNOSIS — Z66 Do not resuscitate: Secondary | ICD-10-CM | POA: Diagnosis present

## 2024-05-26 DIAGNOSIS — Z823 Family history of stroke: Secondary | ICD-10-CM

## 2024-05-26 DIAGNOSIS — Z515 Encounter for palliative care: Secondary | ICD-10-CM | POA: Diagnosis not present

## 2024-05-26 DIAGNOSIS — Z7982 Long term (current) use of aspirin: Secondary | ICD-10-CM | POA: Diagnosis not present

## 2024-05-26 DIAGNOSIS — R296 Repeated falls: Secondary | ICD-10-CM

## 2024-05-26 DIAGNOSIS — Z1152 Encounter for screening for COVID-19: Secondary | ICD-10-CM

## 2024-05-26 DIAGNOSIS — D696 Thrombocytopenia, unspecified: Secondary | ICD-10-CM | POA: Diagnosis present

## 2024-05-26 DIAGNOSIS — Z90710 Acquired absence of both cervix and uterus: Secondary | ICD-10-CM | POA: Diagnosis not present

## 2024-05-26 DIAGNOSIS — N3001 Acute cystitis with hematuria: Secondary | ICD-10-CM | POA: Diagnosis present

## 2024-05-26 DIAGNOSIS — Z825 Family history of asthma and other chronic lower respiratory diseases: Secondary | ICD-10-CM

## 2024-05-26 DIAGNOSIS — Z806 Family history of leukemia: Secondary | ICD-10-CM

## 2024-05-26 DIAGNOSIS — K449 Diaphragmatic hernia without obstruction or gangrene: Secondary | ICD-10-CM | POA: Diagnosis present

## 2024-05-26 DIAGNOSIS — G9341 Metabolic encephalopathy: Secondary | ICD-10-CM | POA: Diagnosis present

## 2024-05-26 DIAGNOSIS — R4182 Altered mental status, unspecified: Secondary | ICD-10-CM | POA: Diagnosis present

## 2024-05-26 DIAGNOSIS — F411 Generalized anxiety disorder: Secondary | ICD-10-CM | POA: Diagnosis present

## 2024-05-26 DIAGNOSIS — I129 Hypertensive chronic kidney disease with stage 1 through stage 4 chronic kidney disease, or unspecified chronic kidney disease: Secondary | ICD-10-CM | POA: Diagnosis present

## 2024-05-26 DIAGNOSIS — Z79899 Other long term (current) drug therapy: Secondary | ICD-10-CM

## 2024-05-26 DIAGNOSIS — N3281 Overactive bladder: Secondary | ICD-10-CM | POA: Diagnosis present

## 2024-05-26 DIAGNOSIS — N179 Acute kidney failure, unspecified: Secondary | ICD-10-CM | POA: Diagnosis present

## 2024-05-26 DIAGNOSIS — M810 Age-related osteoporosis without current pathological fracture: Secondary | ICD-10-CM | POA: Diagnosis present

## 2024-05-26 DIAGNOSIS — E860 Dehydration: Secondary | ICD-10-CM | POA: Diagnosis present

## 2024-05-26 DIAGNOSIS — K219 Gastro-esophageal reflux disease without esophagitis: Secondary | ICD-10-CM | POA: Diagnosis present

## 2024-05-26 DIAGNOSIS — R404 Transient alteration of awareness: Principal | ICD-10-CM

## 2024-05-26 DIAGNOSIS — E7800 Pure hypercholesterolemia, unspecified: Secondary | ICD-10-CM | POA: Diagnosis present

## 2024-05-26 DIAGNOSIS — N182 Chronic kidney disease, stage 2 (mild): Secondary | ICD-10-CM | POA: Diagnosis present

## 2024-05-26 DIAGNOSIS — Z7189 Other specified counseling: Secondary | ICD-10-CM | POA: Diagnosis not present

## 2024-05-26 DIAGNOSIS — R5381 Other malaise: Secondary | ICD-10-CM | POA: Insufficient documentation

## 2024-05-26 DIAGNOSIS — N390 Urinary tract infection, site not specified: Secondary | ICD-10-CM

## 2024-05-26 DIAGNOSIS — Z8261 Family history of arthritis: Secondary | ICD-10-CM

## 2024-05-26 DIAGNOSIS — Z8249 Family history of ischemic heart disease and other diseases of the circulatory system: Secondary | ICD-10-CM | POA: Diagnosis not present

## 2024-05-26 DIAGNOSIS — I739 Peripheral vascular disease, unspecified: Secondary | ICD-10-CM | POA: Diagnosis present

## 2024-05-26 DIAGNOSIS — Z811 Family history of alcohol abuse and dependence: Secondary | ICD-10-CM

## 2024-05-26 LAB — I-STAT CHEM 8, ED
BUN: 38 mg/dL — ABNORMAL HIGH (ref 8–23)
Calcium, Ion: 1.32 mmol/L (ref 1.15–1.40)
Chloride: 98 mmol/L (ref 98–111)
Creatinine, Ser: 1.7 mg/dL — ABNORMAL HIGH (ref 0.44–1.00)
Glucose, Bld: 120 mg/dL — ABNORMAL HIGH (ref 70–99)
HCT: 36 % (ref 36.0–46.0)
Hemoglobin: 12.2 g/dL (ref 12.0–15.0)
Potassium: 4.3 mmol/L (ref 3.5–5.1)
Sodium: 135 mmol/L (ref 135–145)
TCO2: 27 mmol/L (ref 22–32)

## 2024-05-26 LAB — URINALYSIS, ROUTINE W REFLEX MICROSCOPIC
Bilirubin Urine: NEGATIVE
Glucose, UA: NEGATIVE mg/dL
Ketones, ur: NEGATIVE mg/dL
Nitrite: NEGATIVE
Protein, ur: 100 mg/dL — AB
RBC / HPF: >50 RBC/hpf (ref 0–5)
Specific Gravity, Urine: 1.018 (ref 1.005–1.030)
WBC, UA: >50 WBC/hpf (ref 0–5)
pH: 5 (ref 5.0–8.0)

## 2024-05-26 LAB — COMPREHENSIVE METABOLIC PANEL WITH GFR
ALT: 114 U/L — ABNORMAL HIGH (ref 0–44)
AST: 303 U/L — ABNORMAL HIGH (ref 15–41)
Albumin: 3.7 g/dL (ref 3.5–5.0)
Alkaline Phosphatase: 96 U/L (ref 38–126)
Anion gap: 12 (ref 5–15)
BUN: 36 mg/dL — ABNORMAL HIGH (ref 8–23)
CO2: 25 mmol/L (ref 22–32)
Calcium: 10.5 mg/dL — ABNORMAL HIGH (ref 8.9–10.3)
Chloride: 98 mmol/L (ref 98–111)
Creatinine, Ser: 1.59 mg/dL — ABNORMAL HIGH (ref 0.44–1.00)
GFR, Estimated: 29 mL/min — ABNORMAL LOW
Glucose, Bld: 120 mg/dL — ABNORMAL HIGH (ref 70–99)
Potassium: 4.4 mmol/L (ref 3.5–5.1)
Sodium: 134 mmol/L — ABNORMAL LOW (ref 135–145)
Total Bilirubin: 0.5 mg/dL (ref 0.0–1.2)
Total Protein: 7.3 g/dL (ref 6.5–8.1)

## 2024-05-26 LAB — CBC
HCT: 37.7 % (ref 36.0–46.0)
Hemoglobin: 12 g/dL (ref 12.0–15.0)
MCH: 30.2 pg (ref 26.0–34.0)
MCHC: 31.8 g/dL (ref 30.0–36.0)
MCV: 94.7 fL (ref 80.0–100.0)
Platelets: 145 K/uL — ABNORMAL LOW (ref 150–400)
RBC: 3.98 MIL/uL (ref 3.87–5.11)
RDW: 13 % (ref 11.5–15.5)
WBC: 16.4 K/uL — ABNORMAL HIGH (ref 4.0–10.5)
nRBC: 0 % (ref 0.0–0.2)

## 2024-05-26 LAB — RESP PANEL BY RT-PCR (RSV, FLU A&B, COVID)  RVPGX2
Influenza A by PCR: NEGATIVE
Influenza B by PCR: NEGATIVE
Resp Syncytial Virus by PCR: NEGATIVE
SARS Coronavirus 2 by RT PCR: NEGATIVE

## 2024-05-26 LAB — PROTIME-INR
INR: 1.2 (ref 0.8–1.2)
Prothrombin Time: 15.4 s — ABNORMAL HIGH (ref 11.4–15.2)

## 2024-05-26 LAB — I-STAT CG4 LACTIC ACID, ED: Lactic Acid, Venous: 1.4 mmol/L (ref 0.5–1.9)

## 2024-05-26 LAB — ETHANOL: Alcohol, Ethyl (B): <15 mg/dL

## 2024-05-26 MED ORDER — ENOXAPARIN SODIUM 30 MG/0.3ML IJ SOSY
30.0000 mg | PREFILLED_SYRINGE | INTRAMUSCULAR | Status: DC
  Administered 2024-05-27 – 2024-05-29 (×3): 30 mg via SUBCUTANEOUS
  Filled 2024-05-26 (×3): qty 0.3

## 2024-05-26 MED ORDER — VANCOMYCIN HCL 1250 MG/250ML IV SOLN
1250.0000 mg | Freq: Once | INTRAVENOUS | Status: AC
  Administered 2024-05-26: 1250 mg via INTRAVENOUS
  Filled 2024-05-26: qty 250

## 2024-05-26 MED ORDER — SODIUM CHLORIDE 0.9 % IV SOLN: INTRAVENOUS | Status: DC

## 2024-05-26 MED ORDER — SODIUM CHLORIDE 0.9 % IV SOLN
2.0000 g | Freq: Once | INTRAVENOUS | Status: AC
  Administered 2024-05-26: 2 g via INTRAVENOUS
  Filled 2024-05-26: qty 12.5

## 2024-05-26 MED ORDER — ACETAMINOPHEN 650 MG RE SUPP: 650.0000 mg | Freq: Four times a day (QID) | RECTAL | Status: DC | PRN

## 2024-05-26 MED ORDER — ONDANSETRON HCL 4 MG PO TABS: 4.0000 mg | ORAL_TABLET | Freq: Four times a day (QID) | ORAL | Status: DC | PRN

## 2024-05-26 MED ORDER — LACTATED RINGERS IV BOLUS
500.0000 mL | Freq: Once | INTRAVENOUS | Status: AC
  Administered 2024-05-26: 500 mL via INTRAVENOUS

## 2024-05-26 MED ORDER — BISACODYL 5 MG PO TBEC: 5.0000 mg | DELAYED_RELEASE_TABLET | Freq: Every day | ORAL | Status: DC | PRN

## 2024-05-26 MED ORDER — SENNOSIDES-DOCUSATE SODIUM 8.6-50 MG PO TABS: 1.0000 | ORAL_TABLET | Freq: Every evening | ORAL | Status: DC | PRN

## 2024-05-26 MED ORDER — ONDANSETRON HCL 4 MG/2ML IJ SOLN: 4.0000 mg | Freq: Four times a day (QID) | INTRAMUSCULAR | Status: DC | PRN

## 2024-05-26 MED ORDER — LACTATED RINGERS IV BOLUS
1000.0000 mL | Freq: Once | INTRAVENOUS | Status: AC
  Administered 2024-05-26: 1000 mL via INTRAVENOUS

## 2024-05-26 MED ORDER — ACETAMINOPHEN 325 MG PO TABS
650.0000 mg | ORAL_TABLET | Freq: Four times a day (QID) | ORAL | Status: DC | PRN
  Administered 2024-05-27 – 2024-05-29 (×2): 650 mg via ORAL
  Filled 2024-05-26 (×2): qty 2

## 2024-05-26 NOTE — ED Notes (Signed)
 [facility].   Staff stated she had 2 unwitnessed falls yesterday but did not send her out. No reported LOC, no thinners. Bruising on RUE.   Typically AxOx4, able to perform ADLS. AxOx2 currently disoriented to time and situation.   EMS vitals:  BP 108/56 HR 86 O2 3L at 95% CBG 167 22 L Forearm

## 2024-05-26 NOTE — H&P (Signed)
 " History and Physical  Tina Hampton FMW:[id number] DOB: 10-Nov-1925 DOA: 05/26/2024  PCP: Tina Fredia CROME, MD   Chief Complaint: Altered mental status, recurrent falls, generalized weakness  HPI: Tina Hampton is a 89 y.o. female with medical history significant for HTN, HLD, DDD, GAD, depression, insomnia, osteoporosis, GERD, PAD, overactive bladder and CKD stage II who presents via EMS from [facility] for evaluation of generalized weakness, altered mental status and recurrent falls.  History obtained mostly from son over the phone who reports that patient started feeling weak and lethargic Friday. She had an unwitnessed fall in the shower on Friday but did not hit her head. Over the last 2 days, she has become more lethargic and slightly confused, asking family about her husband (he passed away 10 years ago). Per bedside, patient with eyes slightly open, endorses not feeling well but unable to answer most questions. Son reports that patient has a history of UTI.  ED Course: Initial vitals show afebrile, RR 20-27, HR 80-90s, SBP 100-1 30s, SpO2 94% on 2 L. Initial labs significant for sodium 134, BUNs/creatinine 36/1.59, calcium  10.5, AST/ALT 303/114, WBC 16.4, normal INR, negative flu/COVID and RSV test, UA shows small hemoglobinuria, negative nitrite, large leuks, WBC >50, RBC > 50 and few bacteria. EKG shows sinus rhythm.  CT head negative.  CT chest/abdomen/pelvis shows a large hiatal hernia without obstruction, symmetric right perinephric and peripelvic stranding, and circumferential bladder wall thickening.  Pt received IV vancomycin , IV cefepime  and IV fluid bolus. TR[facility] was consulted for admission.   Review of Systems: Please see HPI for pertinent positives and negatives. A complete 10 system review of systems are otherwise negative.  Past Medical History:  Diagnosis Date   Abnormal mammogram, unspecified 04/19/2011   Allergy    Anxiety    Backache, unspecified  01/21/2005   Cervicalgia 08/20/2013   Chronic kidney disease, stage II (mild) 04/03/2009   Chronic rhinitis 09/28/2010   DDD (degenerative disc disease)    Depression    Dermatophytosis of nail 01/21/2005   Diverticulosis of colon (without mention of hemorrhage) 06/17/2005   Edema 11/26/2004   GERD (gastroesophageal reflux disease)    Hiatal hernia    High cholesterol    Hyperglycemia 11/18/2014   Hypertension    Insomnia, unspecified 10/18/2011   Left inguinal hernia 04/24/2014   Nocturia 09/19/2008   Osteoporosis, senile 11/18/2014   Other abnormal blood chemistry 04/03/2007   Pain in joint, pelvic region and thigh 11/12/2011   Pain in joint, shoulder region 08/15/2006   Pain in limb 08/26/2005   Palpitations 09/29/2009   PAOD (peripheral arterial occlusive disease) 11/18/2014   Right leg; diminished popliteal, dorsalis pedis, and posterior tibial artery pulsations    Postmenopausal atrophic vaginitis 12/14/2004   Scoliosis    Senile osteoporosis 12/14/2004   Tension headache 10/04/2005   Unspecified cataract 11/26/2004   Unspecified constipation 01/21/2005   Unspecified urinary incontinence 11/26/2004   Vaginal stenosis 11/18/2014   Tight band about 1/2 inches into the vagina which will not allow passage of my index finger.    Past Surgical History:  Procedure Laterality Date   ABDOMINAL HYSTERECTOMY  1969   Dr.Braun    bilateral eyelid surgery  2005   Dr.Scott   BILATERAL OOPHORECTOMY  2002   BREAST SURGERY     benign tumor removal, left breast 1969   CATARACT EXTRACTION     Right   CHOLECYSTECTOMY  1990   Dr.Moore    COLONOSCOPY  3/18/20008  inflammation, diverticular associated colitis -Dr. Teressa   COSMETIC SURGERY     ESOPHAGOGASTRODUODENOSCOPY N/A 01/24/2014   Procedure: ESOPHAGOGASTRODUODENOSCOPY (EGD);  Surgeon: Norleen LOISE Kiang, MD;  Location: [facility] ENDOSCOPY;  Service: Endoscopy;  Laterality: N/A;   ESOPHAGOGASTRODUODENOSCOPY ENDOSCOPY  04/04/14   Dr. Teressa   EYE SURGERY     FOREIGN BODY  REMOVAL N/A 01/24/2014   Procedure: FOREIGN BODY REMOVAL;  Surgeon: Norleen LOISE Kiang, MD;  Location: [facility] ENDOSCOPY;  Service: Endoscopy;  Laterality: N/A;   HIP PINNING,CANNULATED  11/07/2011   Procedure: CANNULATED HIP PINNING;  Surgeon: Lynwood FORBES Better, MD;  Location: [facility] ORS;  Service: Orthopedics;  Laterality: Left;   ORIF FEMORAL NECK FRACTURE W/ [facility]  11/07/2011   Dr.Nitka    TONSILLECTOMY     Dr.Joe Claudene   TUBAL LIGATION     VAGINAL PROLAPSE REPAIR  2002   Dr.Horback    Social History:  reports that she has never smoked. She has never used smokeless tobacco. She reports that she does not currently use alcohol . She reports that she does not use drugs.  Allergies[1]  Family History  Problem Relation Age of Onset   Heart disease Mother    Stroke Mother    Emphysema Father    Arthritis Father    Alcohol  abuse Brother    Heart disease Son    Leukemia Son    Esophageal cancer Neg Hx    Stomach cancer Neg Hx    Colon cancer Neg Hx      Prior to Admission medications  Medication Sig Start Date End Date Taking? Authorizing Provider  acetaminophen  (TYLENOL ) 325 MG tablet Take 650 mg by mouth every 4 (four) hours as needed for fever.   Yes [provider]  Acetylcysteine (NAC) 500 MG CAPS Take 600 mg by mouth daily.   Yes [provider]  antiseptic oral rinse (BIOTENE) LIQD 10 mLs by Mouth Rinse route as needed for dry mouth. Resident may self administer and keep in room   Yes [provider]  aspirin  81 MG chewable tablet Chew 81 mg by mouth daily.   Yes [provider]  bismuth subsalicylate (PEPTO BISMOL) 262 MG/15ML suspension Take 30 mLs by mouth daily as needed for diarrhea or loose stools.   Yes [provider]  buPROPion  (WELLBUTRIN  XL) 150 MG 24 hr tablet Take 150 mg by mouth daily.   Yes [provider]  Calcium  Carb-Cholecalciferol  600-800 MG-UNIT TABS Take 1 tablet by mouth 2 (two) times daily.   Yes [provider]   cholecalciferol  (VITAMIN D ) 1000 UNITS tablet Take 1,000 Units by mouth daily.   Yes [provider]  docusate sodium  (COLACE) 100 MG capsule Take 1 capsule (100 mg total) by mouth every other day. 10/20/23  Yes Fargo, Amy E, NP  escitalopram  (LEXAPRO ) 20 MG tablet Take 1 tablet (20 mg total) by mouth daily. 07/03/20  Yes Mozingo, Regina Nattalie, NP  Glucosamine HCl 500 MG TABS Take 4 capsules by mouth 2 (two) times daily.   Yes [provider]  hydrocortisone  1 % ointment Apply 1 Application topically 2 (two) times daily. Apply to perineum   Yes [provider]  hydrocortisone  2.5 % cream Apply 1 Application topically See admin instructions. Apply to anal topically as needed for irriation as needed twice daily 01/08/24  Yes [provider]  hydrOXYzine  (ATARAX ) 10 MG tablet Take 1 tablet (10 mg total) by mouth 2 (two) times daily. 12/26/22  Yes Gil Greig FORBES, NP  hypromellose (GENTEAL SEVERE) 0.3 % GEL ophthalmic ointment Place 1 Application into both eyes as needed for dry eyes.   Yes [provider]  LORazepam  (ATIVAN ) 0.5 MG tablet Take 1 tablet (0.5 mg total) by mouth 4 (four) times daily. 05/15/24  Yes Mast, Man X, NP  lubiprostone  (AMITIZA ) 8 MCG capsule Take 8 mcg by mouth 2 (two) times daily with a meal.   Yes [provider]  Menthol , Topical Analgesic, 4 % GEL Apply 1 application  topically as needed. Resident may self administer and keep in room   Yes [provider]  mirtazapine  (REMERON ) 45 MG tablet Take 1 tablet (45 mg total) by mouth at bedtime. 11/30/22  Yes Fargo, Amy E, NP  Multiple Vitamins-Minerals (PRESERVISION AREDS 2+MULTI VIT PO) Take 1 tablet by mouth 2 (two) times daily.   Yes [provider]  NIFEdipine  (ADALAT  CC) 60 MG 24 hr tablet Take 60 mg by mouth daily. 03/13/24  Yes [provider]  pantoprazole  (PROTONIX ) 40 MG tablet One daily to reduce stomach acid Patient taking differently: Take 40 mg  by mouth daily. One daily to reduce stomach acid 07/12/16  Yes Landy Rome MATSU, MD  polyethylene glycol (MIRALAX  / GLYCOLAX ) 17 g packet Take 17 g by mouth daily. Mix 17g  with 8 oz. of fluid daily for constipation   Yes [provider]  Povidone (IVIZIA DRY EYES OP) Place 2 drops into both eyes 4 (four) times daily.   Yes [provider]  pravastatin  (PRAVACHOL ) 10 MG tablet Take 10 mg by mouth daily.   Yes [provider]    Physical Exam: BP (!) 108/57   Pulse 93   Temp 98 F (36.7 C) (Oral)   Resp (!) 25   SpO2 94%  General: Acutely ill and weak appearing elderly woman laying in bed. No acute distress. HEENT: [place]/AT. Anicteric sclera. Cracked lips. Dry mucous membrane. CV: RRR. No murmurs, rubs, or gallops. No LE edema Pulmonary: On 2 L . Lungs CTAB. Normal effort. No wheezing or rales. Decreased breath sounds at the bases.  Abdominal: Soft, nontender, nondistended. Normal bowel sounds. Extremities: Palpable radial and DP pulses. Normal ROM. Skin: Warm and dry. No obvious rash or lesions. Neuro: Somnolent, oriented to self and person but not to place, time or situation. Moves all extremities. Normal sensation to light touch. No focal deficit. Psych: Normal mood and affect          Labs on Admission:  Basic Metabolic Panel: Recent Labs  Lab 05/26/24 1649 05/26/24 1800  NA 134* 135  K 4.4 4.3  CL 98 98  CO2 25  --   GLUCOSE 120* 120*  BUN 36* 38*  CREATININE 1.59* 1.70*  CALCIUM  10.5*  --    Liver Function Tests: Recent Labs  Lab 05/26/24 1649  AST 303*  ALT 114*  ALKPHOS 96  BILITOT 0.5  PROT 7.3  ALBUMIN 3.7   No results for input(s): LIPASE, AMYLASE in the last 168 hours. No results for input(s): AMMONIA in the last 168 hours. CBC: Recent Labs  Lab 05/26/24 1649 05/26/24 1800  WBC 16.4*  --   HGB 12.0 12.2  HCT 37.7 36.0  MCV 94.7  --   PLT 145*  --    Cardiac Enzymes: No results for input(s): CKTOTAL, CKMB,  CKMBINDEX, TROPONINI in the last 168 hours. BNP (last 3 results) No results for input(s): BNP in the last 8760 hours.  ProBNP (last 3 results) No results for  input(s): PROBNP in the last 8760 hours.  CBG: No results for input(s): GLUCAP in the last 168 hours.  Radiological Exams on Admission: CT CERVICAL SPINE WO CONTRAST Result Date: 05/26/2024 EXAM: CT CERVICAL SPINE WITHOUT CONTRAST 05/26/2024 07:11:31 PM TECHNIQUE: CT of the cervical spine was performed without the administration of intravenous contrast. Multiplanar reformatted images are provided for review. Automated exposure control, iterative reconstruction, and/or weight based adjustment of the mA/kV was utilized to reduce the radiation dose to as low as reasonably achievable. COMPARISON: None available. CLINICAL HISTORY: Polytrauma, blunt. FINDINGS: BONES AND ALIGNMENT: 3 mm anterolisthesis C7-T1, likely degenerative in nature. Degenerative ankylosis C4-C5. Ankylosis of the facet joints of C3-C5 bilaterally. No acute fracture or traumatic listhesis. Motion degraded examination. DEGENERATIVE CHANGES: Advanced degenerative disc disease throughout the cervical spine with disc space narrowing and endplate remodeling. 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 Multilevel uncovertebral and facet arthrosis results in multilevel moderate-to-severe neuroforaminal narrowing, most severe at C5-C6 and C6-C7. SOFT TISSUES: No prevertebral soft tissue swelling. IMPRESSION: 1. No acute fracture or traumatic listhesis. 2. Motion degraded examination. 3. 3 mm anterolisthesis at C7-T1, likely degenerative. 4. Advanced degenerative disc disease throughout the cervical spine with disc space narrowing and endplate remodeling. 5. Ankylosis of the facet joints of C3-C5 bilaterally. 6. Degenerative ankylosis at C4-5. 7. Moderate central canal stenosis at C4-5 secondary to  posterior disc osteophyte complex and congenital narrowing of the pedicles. 8. Multilevel moderate-to-severe neuroforaminal narrowing, most severe at C5-6 and C6-7. Electronically signed by: Dorethia Molt MD MD 05/26/2024 07:50 PM EST RP Workstation: [id number]   CT CHEST ABDOMEN PELVIS WO CONTRAST Result Date: 05/26/2024 EXAM: CT CHEST, ABDOMEN AND PELVIS WITHOUT CONTRAST 05/26/2024 07:11:31 PM TECHNIQUE: CT of the chest, abdomen and pelvis was performed without the administration of intravenous contrast. Multiplanar reformatted images are provided for review. Automated exposure control, iterative reconstruction, and/or weight based adjustment of the mA/kV was utilized to reduce the radiation dose to as low as reasonably achievable. COMPARISON: 04/20/2014 CLINICAL HISTORY: Polytrauma, blunt. Multiple unwitnessed fall, bruising, altered mental status. FINDINGS: CHEST: MEDIASTINUM AND LYMPH NODES: Heart and pericardium are unremarkable. Extensive multivessel coronary artery calcification. Extensive calcification of the aortic valve leaflets. The central pulmonary arteries are enlarged in keeping with changes of pulmonary arterial hypertension. Moderate atherosclerotic calcification within the thoracic aorta. 22 mm nodule within the left thyroid  lobe, not well assessed this examination. No follow-up imaging recommended unless clinically indicated. The central airways are clear. No mediastinal, hilar or axillary lymphadenopathy. LUNGS AND PLEURA: Large hiatal hernia, and large since prior examination, with the entire stomach, distal pancreas and the distal transverse and proximal descending colon within the thoracic compartment. 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 No focal consolidation or pulmonary edema. No pleural effusion. No pneumothorax. ABDOMEN AND PELVIS: LIVER: Unremarkable. GALLBLADDER AND BILE DUCTS: Status post cholecystectomy. No biliary ductal dilatation. SPLEEN: No acute  abnormality. PANCREAS: No acute abnormality. ADRENAL GLANDS: No acute abnormality. KIDNEYS, URETERS AND BLADDER: Multiple nonobstructing calculi are seen within the right renal pelvis measuring 4 and 10 mm. There is superimposed mild asymmetric right perinephric and peripelvic stranding. Perinephric and peripelvic stranding from the right may reflect residual of a recently passed renal calculus. Alternatively, inflammatory condition such as an ascending urinary tract infection could appear similarly. Correlation with urinalysis and urine culture may be helpful for further management. No hydronephrosis. The left kidney is unremarkable; no intrarenal or ureteral calculi are identified and  there is no hydronephrosis. Several granular dependently layering calcifications are seen within the bladder lumen measuring 2-3 mm in size. The bladder is partially decompressed. There is, however, circumferential bladder wall thickening noted which may reflect changes of underlying cystitis or chronic bladder outlet obstruction. GI AND BOWEL: Severe descending and sigmoid colonic diverticulosis without superimposed acute inflammatory change. The visualized bowel is otherwise unremarkable. No evidence of obstruction. Appendix normal. REPRODUCTIVE ORGANS: Status post hysterectomy. No acute abnormality. PERITONEUM AND RETROPERITONEUM: No ascites. No free air. VASCULATURE: Aorta is normal in caliber. Moderate aortoiliac atherosclerotic calcification. ABDOMINAL AND PELVIS LYMPH NODES: No lymphadenopathy. BONES AND SOFT TISSUES: Left hip pinning has been performed. Severe lumbar dextroscoliosis. Osseous structures are age appropriate with advanced degenerative changes seen within the thoracolumbar spine. No focal soft tissue abnormality. Had some masses. IMPRESSION: 1. Large hiatal hernia with organoaxial gastric volvulus without evidence of obstruction. 2. Multiple nonobstructing calculi in the right renal pelvis with mild asymmetric  right perinephric and peripelvic stranding, which may reflect residual of a recently passed renal calculus or an ascending urinary tract infection; urinalysis and urine culture may be helpful for further management. 3. Circumferential bladder wall thickening, which may reflect cystitis or chronic bladder outlet obstruction. 4. Severe descending and sigmoid colonic diverticulosis without acute inflammatory change. 5. Enlarged central pulmonary arteries, in keeping with pulmonary arterial hypertension. 6. Extensive multivessel coronary artery calcification and extensive calcification of the aortic valve leaflets. 7. Left thyroid  nodule measuring 22 mm, not well assessed on this examination; no follow-up imaging recommended unless clinically indicated. 8. Severe lumbar dextroscoliosis with advanced degenerative changes of the thoracolumbar spine. 9. Raf score includes aortic atherosclerosis (ICD10-I70.0). Electronically signed by: Dorethia Molt MD MD 05/26/2024 07:45 PM EST RP Workstation: [id number]   CT T-SPINE NO CHARGE Result Date: 05/26/2024 EXAM: CT THORACIC SPINE WITHOUT CONTRAST 05/26/2024 07:11:31 PM TECHNIQUE: CT of the thoracic spine was performed without the administration of intravenous contrast. Multiplanar reformatted images are provided for review. Automated exposure control, iterative reconstruction, and/or weight based adjustment of the mA/kV was utilized to reduce the radiation dose to as low as reasonably achievable. COMPARISON: None available. CLINICAL HISTORY: FINDINGS: BONES AND ALIGNMENT: Normal vertebral body heights. 2 mm anterolisthesis T2-3, likely degenerative in nature. No acute fracture or traumatic listhesis of the thoracic spine. No suspicious bone lesion. DEGENERATIVE CHANGES: Intervertebral disc space narrowing and endplate remodeling throughout the thoracic spine in keeping with changes of moderate-to-severe degenerative disc disease. No high-grade canal stenosis. Moderate  bilateral neuroforaminal narrowing at T8-9. No high-grade neural foraminal narrowing identified. SOFT TISSUES: Left lower lobe consolidation, better assessed on accompanying CT examination of the chest, abdomen, and pelvis. Please refer to that report for further details. IMPRESSION: 1. No acute fracture or traumatic listhesis of the thoracic spine. 2. Left lower lobe consolidation. 3. Moderate-to-severe degenerative disc disease throughout the thoracic spine, without high-grade canal stenosis. 4. Moderate bilateral neuroforaminal narrowing at T8-9. 5. Minimal anterolisthesis at T2-3, likely degenerative. Electronically signed by: Dorethia Molt MD MD 05/26/2024 07:33 PM EST RP Workstation: [id number]   CT L-SPINE NO CHARGE Result Date: 05/26/2024 EXAM: CT OF THE LUMBAR SPINE WITHOUT CONTRAST 05/26/2024 07:11:31 PM TECHNIQUE: CT of the lumbar spine was performed without the administration of intravenous contrast. Multiplanar reformatted images are provided for review. Automated exposure control, iterative reconstruction, and/or weight based adjustment of the mA/kV was utilized to reduce the radiation dose to as low as reasonably achievable. COMPARISON: None available. CLINICAL HISTORY: FINDINGS: BONES AND ALIGNMENT: Normal vertebral body heights.  No acute fracture or suspicious bone lesion. No traumatic listhesis. Severe lumbar dextroscoliosis, apex right at L3. Mild loss of the normal lumbar lordosis. There is degenerative ankylosis of the L2 and L3 vertebral bodies along the left lateral aspect. DEGENERATIVE CHANGES: There is diffuse intervertebral disc space narrowing, endplate remodeling, and vacuum disc phenomena throughout the lumbar spine in keeping with changes of severe degenerative disc disease. Multifactorial moderate canal stenosis at L3-4 and L4-5. Severe right neuroforaminal narrowing at L4-5 and L5-S1 secondary to disc height loss and osteophytic ridging and facet arthrosis. Moderate-to-severe left  neuroforaminal narrowing at L3-4 and L5-S1. SOFT TISSUES: Multiple nonobstructing calculi within the right kidney measuring up to 11 mm. Moderate aortoiliac atherosclerotic calcification. Severe sigmoid diverticulosis noted. IMPRESSION: 1. No acute fracture or traumatic listhesis. 2. Severe lumbar dextroscoliosis, apex right at L3, with mild loss of the normal lumbar lordosis. 3. Severe degenerative disc disease with diffuse intervertebral disc space narrowing, endplate remodeling, and vacuum disc phenomena throughout the lumbar spine, with degenerative ankylosis of the L2 and L3 vertebral bodies along the left lateral aspect. 4. Multifactorial moderate canal stenosis at L3-4 and L4-5. 5. Severe right neuroforaminal narrowing at L4-5 and L5-S1, with moderate-to-severe left neuroforaminal narrowing at L3-4 and L5-S1. 6. Multiple nonobstructing right renal calculi measuring up to 11 mm. 7. Moderate aortoiliac atherosclerotic calcification. 8. Severe sigmoid diverticulosis. Electronically signed by: Dorethia Molt MD MD 05/26/2024 07:30 PM EST RP Workstation: [id number]   CT HEAD WO CONTRAST Result Date: 05/26/2024 EXAM: CT HEAD WITHOUT CONTRAST 05/26/2024 07:11:31 PM TECHNIQUE: CT of the head was performed without the administration of intravenous contrast. Automated exposure control, iterative reconstruction, and/or weight based adjustment of the mA/kV was utilized to reduce the radiation dose to as low as reasonably achievable. COMPARISON: None available. CLINICAL HISTORY: Head trauma, moderate-severe. FINDINGS: BRAIN AND VENTRICLES: No acute hemorrhage. No evidence of acute infarct. No hydrocephalus. No extra-axial collection. No mass effect or midline shift. Cerebral volume loss. Moderate patchy supratentorial white matter hypodensities. ORBITS: No acute abnormality. SINUSES: Moderate mucosal thickening in left ethmoid sinus. SOFT TISSUES AND SKULL: No acute soft tissue abnormality. No skull fracture.  VASCULATURE: Atherosclerosis. LIMITATIONS/ARTIFACTS: Motion artifact. IMPRESSION: 1. No acute intracranial abnormality. 2. Chronic cerebral volume loss and moderate chronic supratentorial white matter change. 3. Atherosclerosis. Electronically signed by: Dorethia Molt MD MD 05/26/2024 07:17 PM EST RP Workstation: [id number]   Assessment/Plan Shanayah Kaffenberger is a 89 y.o. female with medical history significant for HTN, HLD, DDD, GAD, depression, insomnia, osteoporosis, GERD, PAD, overactive bladder and CKD stage II who presents via EMS from friend's home nursing facility for evaluation of generalized weakness, altered mental status and recurrent falls and admitted for acute metabolic encephalopathy  # Acute metabolic encephalopathy - Patient currently residing at SNF presented with lethargy and disorientation - AMS secondary to acute urinary tract infection and dehydration - Delirium precautions  # Acute cystitis - Pt presented with altered mental status and lethargy - Urinalysis shows signs of infection - Continue IV rocephin  - F/u urine culture - Trend CBC and fever curve  # AKI - Creatinine elevated to 1.59, with recent normal baseline of 0.8-1.0 - Likely secondary to dehydration in the setting of acute cystitis - Start IV NS at 150 cc/hr for 1 day - Trend renal function and avoid nephrotoxic meds  # Recurrent falls # Generalized weakness - Presented after 2 falls over the last few days in the setting of generalized weakness - At baseline, ambulates with a walker - Negative trauma  scan on admission - PT/OT eval and treat - Fall precautions  # Goals of care - Discussed with patient's son, Malyna Budney, over the phone about patient's goals of care and current CODE STATUS.  He informs me that patient alerted to be full code back in 2018 but they have not had any further discussions since then. His older brother is patient's POA. He lives in [place]  but will be here Tuesday  to see patient and they are planning on moving patient to [place]  once discharged and stable. States they will discuss patient's further goals of care and advance directives when his brother arrives. - Chaplain consulted to assist with creation/updating of advance directives - Patient remains full code  Chronic medical problems # GAD # HTN # HLD # Insomnia # Overactive bladder # GERD - Resume home meds after improvement in mental status and completion of med rec  DVT prophylaxis: Lovenox      Code Status: Full Code  Consults called: None  Family Communication: Discussed results/findings and plan for admission with son over the phone  Severity of Illness: The appropriate patient status for this patient is INPATIENT. Inpatient status is judged to be reasonable and necessary in order to provide the required intensity of service to ensure the patient's safety. The patient's presenting symptoms, physical exam findings, and initial radiographic and laboratory data in the context of their chronic comorbidities is felt to place them at high risk for further clinical deterioration. Furthermore, it is not anticipated that the patient will be medically stable for discharge from the hospital within 2 midnights of admission.   * I certify that at the point of admission it is my clinical judgment that the patient will require inpatient hospital care spanning beyond 2 midnights from the point of admission due to high intensity of service, high risk for further deterioration and high frequency of surveillance required.*  Level of care: Med-Surg    Lou Claretta HERO, MD 05/27/2024, 1:24 AM [facility] Pager: [phone] Isaiah 41:10   If 7PM-7AM, please contact night-coverage [email or web address] Password [email or web address]     [1]  Allergies Allergen Reactions   Brexpiprazole Swelling   Lamictal [Lamotrigine] Swelling    Lip swelling   Penicillins Other (See Comments)    CHILDHOOD REACTION    Abilify [Aripiprazole] Rash   "

## 2024-05-26 NOTE — Telephone Encounter (Signed)
 [facility] [employer] called states patient had witness fall on Saturday.Has had change in condition unable to perform own ADL's and incontinent.Order given to sent to ED for evaluation.

## 2024-05-26 NOTE — ED Notes (Signed)
 Urinated at this time. UA sent to lab.

## 2024-05-26 NOTE — ED Provider Notes (Signed)
 " [place] [facility] Provider Note   HPI/ROS    History obtained from patient, nursing and facility.   Tina Hampton is a 89 y.o. female who presents for Fall and Altered Mental Status and who  has a past medical history of Abnormal mammogram, unspecified (04/19/2011), Allergy, Anxiety, Backache, unspecified (01/21/2005), Cervicalgia (08/20/2013), Chronic kidney disease, stage II (mild) (04/03/2009), Chronic rhinitis (09/28/2010), DDD (degenerative disc disease), Depression, Dermatophytosis of nail (01/21/2005), Diverticulosis of colon (without mention of hemorrhage) (06/17/2005), Edema (11/26/2004), GERD (gastroesophageal reflux disease), Hiatal hernia, High cholesterol, Hyperglycemia (11/18/2014), Hypertension, Insomnia, unspecified (10/18/2011), Left inguinal hernia (04/24/2014), Nocturia (09/19/2008), Osteoporosis, senile (11/18/2014), Other abnormal blood chemistry (04/03/2007), Pain in joint, pelvic region and thigh (11/12/2011), Pain in joint, shoulder region (08/15/2006), Pain in limb (08/26/2005), Palpitations (09/29/2009), PAOD (peripheral arterial occlusive disease) (11/18/2014), Postmenopausal atrophic vaginitis (12/14/2004), Scoliosis, Senile osteoporosis (12/14/2004), Tension headache (10/04/2005), Unspecified cataract (11/26/2004), Unspecified constipation (01/21/2005), Unspecified urinary incontinence (11/26/2004), and Vaginal stenosis (11/18/2014).  Patient presents today for altered mentation from facility.  Patient unable to give good history at this time due to altered mentation.  Endorsing head pain, chest pain, and belly pain now.  Spoke with the facility who states that she had 2 falls yesterday that were unwitnessed.  States that she is usually A/OX4 and able to ambulate with a walker.  Since her fall she has been unable to do either of these and has only been A/OX2.  They were doing neurochecks at the facility which showed changes in her mentation, with stable vital signs.  Today  they were able to discuss with family and family was concerned that she was acting differently than normal.  This prompted them to transfer her to the emergency department for further workup and management.  States she had a low-grade fever this morning that responded to Tylenol .  Otherwise has had no medication changes, recent illnesses, or other acute injuries.  Had been acting herself up until yesterday.  States that she did have a big life change recently though where she is going to be moving out of assisted living and moving  to [place]  to move into a nursing facility.  The facility states that they feel that this is brought on a lot of anxiety for her recently.   MDM   I have reviewed the nursing documentation, vital signs, as well as the past medical history, surgical history, family history, and social history.  Initial Assessment:  Patient hemodynamically stable on initial evaluation.  Unable to give good history though at this time.  Mildly hypothermic, but feel that this is not a great reading.  Does not feel cool or overtly warm to touch.  Given altered mentation with recent falls, will obtain full trauma scans.  Very difficult to assess what is truly bothering her on physical exam.  Will also obtain urine, blood cultures, and COVID flu as this could be infectious in etiology.  No overt signs of meningitis at this time with no neck rigidity.  Does have some bruising over the right shoulder without other overt signs of trauma.  No gross focal neurologic deficit, but patient does have poor speech.  CBC with mild leukocytosis 16.4.  No significant anemia, but does have thrombocytopenia to 145.  Metabolic panel here with mild hyponatremia with an apparent AKI and mild transaminitis.  Concern for possible sepsis and dehydration at this time.  Was already given 500 of NS, will give an additional liter of LR.  UA here  with evidence of UTI.  Lactate within normal limits.  Trauma imaging completely  unremarkable and COVID flu negative as well.  Feel that UTI is most likely cause of altered mentation at this time.  Vancomycin  and cefepime  given.  Patient will be admitted to medicine for further workup and management of encephalopathy in the setting of UTI.  Disposition:  I discussed the case with Dr.Amponsah who graciously agreed to admit the patient to their service for continued care.    This patient was staffed with Dr. Jakie who supervised the visit and agreed with the plan of care.   Due to the patients current presenting symptoms, physical exam findings, and the workup stated above, it is thought that the etiology of the patients current presentation is:  1. Transient alteration of awareness   2. Lower urinary tract infectious disease      Clinical Complexity A medically appropriate history, review of systems, and physical exam was performed.  Factors that affect the complexity of this encounter: assessment of correct protocol, laboratory work from this visit, and review of echocardiogram/EKG results  My independent interpretations of diagnostic studies are documented in the ED course above.   If decision rules were used in this patient's evaluation, they are listed below.   Click here for ABCD2, HEART and other calculators  Patient's presentation is most consistent with acute presentation with potential threat to life or bodily function.  MDM generated using voice dictation software and may contain dictation errors. Please contact me for any clarification or with any questions.    Physical Exam, PMH, PSH, Family History, and Social Hsitory   Vitals:   05/26/24 2030 05/26/24 2045 05/26/24 2115 05/26/24 2200  BP: (!) 133/57     Pulse: 95  96 93  Resp:   20   Temp:      TempSrc:      SpO2:  100% 100% 95%    Physical Exam Constitutional:      Appearance: She is ill-appearing.  HENT:     Head: Normocephalic and atraumatic.     Mouth/Throat:     Mouth:  Mucous membranes are dry.     Pharynx: Oropharynx is clear.  Eyes:     Extraocular Movements: Extraocular movements intact.     Conjunctiva/sclera: Conjunctivae normal.     Pupils: Pupils are equal, round, and reactive to light.  Cardiovascular:     Rate and Rhythm: Normal rate and regular rhythm.  Pulmonary:     Effort: Pulmonary effort is normal.     Breath sounds: Normal breath sounds. No wheezing or rales.  Abdominal:     General: Abdomen is flat. There is no distension.     Palpations: Abdomen is soft.     Tenderness: There is abdominal tenderness. There is no guarding or rebound.  Musculoskeletal:        General: Normal range of motion.     Cervical back: Normal range of motion. No rigidity.     Right lower leg: No edema.     Left lower leg: No edema.     Comments: Bruising over right shoulder  Skin:    General: Skin is warm and dry.     Capillary Refill: Capillary refill takes less than 2 seconds.  Neurological:     General: No focal deficit present.     Mental Status: She is disoriented.     GCS: GCS eye subscore is 3. GCS verbal subscore is 4. GCS motor subscore is 6.  Cranial Nerves: No facial asymmetry.     Comments: Moves all extremities spontaneously.  Unable to assess sensation.     Past Medical History:  Diagnosis Date   Abnormal mammogram, unspecified 04/19/2011   Allergy    Anxiety    Backache, unspecified 01/21/2005   Cervicalgia 08/20/2013   Chronic kidney disease, stage II (mild) 04/03/2009   Chronic rhinitis 09/28/2010   DDD (degenerative disc disease)    Depression    Dermatophytosis of nail 01/21/2005   Diverticulosis of colon (without mention of hemorrhage) 06/17/2005   Edema 11/26/2004   GERD (gastroesophageal reflux disease)    Hiatal hernia    High cholesterol    Hyperglycemia 11/18/2014   Hypertension    Insomnia, unspecified 10/18/2011   Left inguinal hernia 04/24/2014   Nocturia 09/19/2008   Osteoporosis, senile 11/18/2014   Other abnormal blood  chemistry 04/03/2007   Pain in joint, pelvic region and thigh 11/12/2011   Pain in joint, shoulder region 08/15/2006   Pain in limb 08/26/2005   Palpitations 09/29/2009   PAOD (peripheral arterial occlusive disease) 11/18/2014   Right leg; diminished popliteal, dorsalis pedis, and posterior tibial artery pulsations    Postmenopausal atrophic vaginitis 12/14/2004   Scoliosis    Senile osteoporosis 12/14/2004   Tension headache 10/04/2005   Unspecified cataract 11/26/2004   Unspecified constipation 01/21/2005   Unspecified urinary incontinence 11/26/2004   Vaginal stenosis 11/18/2014   Tight band about 1/2 inches into the vagina which will not allow passage of my index finger.      Past Surgical History:  Procedure Laterality Date   ABDOMINAL HYSTERECTOMY  1969   Dr.Braun    bilateral eyelid surgery  2005   Dr.Tashera Montalvo   BILATERAL OOPHORECTOMY  2002   BREAST SURGERY     benign tumor removal, left breast 1969   CATARACT EXTRACTION     Right   CHOLECYSTECTOMY  1990   Dr.Moore    COLONOSCOPY  3/18/20008   inflammation, diverticular associated colitis -Dr. Teressa   COSMETIC SURGERY     ESOPHAGOGASTRODUODENOSCOPY N/A 01/24/2014   Procedure: ESOPHAGOGASTRODUODENOSCOPY (EGD);  Surgeon: Norleen LOISE Kiang, MD;  Location: [facility] ENDOSCOPY;  Service: Endoscopy;  Laterality: N/A;   ESOPHAGOGASTRODUODENOSCOPY ENDOSCOPY  04/04/14   Dr. Teressa   EYE SURGERY     FOREIGN BODY REMOVAL N/A 01/24/2014   Procedure: FOREIGN BODY REMOVAL;  Surgeon: Norleen LOISE Kiang, MD;  [facility] END[facility]COPY;  Service: Endoscopy;  Laterality: N/A;   HIP PINNING,CANNULATED  11/07/2011   Procedure: CANNULATED HIP PINNING;  Surgeon: Lynwood FORBES Better, MD;  Location: [facility] ORS;  Service: Orthopedics;  Laterality: Left;   ORIF FEMORAL NECK FRACTURE W/ [facility]  11/07/2011   Dr.Nitka    TONSILLECTOMY     Dr.Joe Claudene   TUBAL LIGATION     VAGINAL PROLAPSE REPAIR  2002   Dr.Horback      Family History  Problem Relation Age of Onset   Heart disease Mother     Stroke Mother    Emphysema Father    Arthritis Father    Alcohol  abuse Brother    Heart disease Son    Leukemia Son    Esophageal cancer Neg Hx    Stomach cancer Neg Hx    Colon cancer Neg Hx     Social History   Tobacco Use   Smoking status: Never   Smokeless tobacco: Never  Substance Use Topics   Alcohol  use: Not Currently    Alcohol /week: 0.0 standard drinks of alcohol   Comment: one glass of wine in evening     Procedures   If procedures were preformed on this patient, they are listed below:  Procedures   Electronically signed by:   Glendia Carlin Ancona, M.D. PGY-2, Emergency Medicine   Please note that this documentation was produced with the assistance of voice-to-text technology and may contain errors.    Ancona Glendia, MD 05/26/24 2318  "

## 2024-05-27 ENCOUNTER — Encounter (HOSPITAL_COMMUNITY): Payer: Self-pay | Admitting: Student

## 2024-05-27 DIAGNOSIS — R296 Repeated falls: Secondary | ICD-10-CM

## 2024-05-27 DIAGNOSIS — N3001 Acute cystitis with hematuria: Secondary | ICD-10-CM

## 2024-05-27 DIAGNOSIS — R531 Weakness: Secondary | ICD-10-CM

## 2024-05-27 DIAGNOSIS — N179 Acute kidney failure, unspecified: Secondary | ICD-10-CM

## 2024-05-27 LAB — CBC
HCT: 32.8 % — ABNORMAL LOW (ref 36.0–46.0)
Hemoglobin: 10.5 g/dL — ABNORMAL LOW (ref 12.0–15.0)
MCH: 29.9 pg (ref 26.0–34.0)
MCHC: 32 g/dL (ref 30.0–36.0)
MCV: 93.4 fL (ref 80.0–100.0)
Platelets: 126 K/uL — ABNORMAL LOW (ref 150–400)
RBC: 3.51 MIL/uL — ABNORMAL LOW (ref 3.87–5.11)
RDW: 13.1 % (ref 11.5–15.5)
WBC: 14 K/uL — ABNORMAL HIGH (ref 4.0–10.5)
nRBC: 0 % (ref 0.0–0.2)

## 2024-05-27 LAB — BASIC METABOLIC PANEL WITH GFR
Anion gap: 10 (ref 5–15)
BUN: 33 mg/dL — ABNORMAL HIGH (ref 8–23)
CO2: 23 mmol/L (ref 22–32)
Calcium: 9.5 mg/dL (ref 8.9–10.3)
Chloride: 101 mmol/L (ref 98–111)
Creatinine, Ser: 1.19 mg/dL — ABNORMAL HIGH (ref 0.44–1.00)
GFR, Estimated: 42 mL/min — ABNORMAL LOW
Glucose, Bld: 110 mg/dL — ABNORMAL HIGH (ref 70–99)
Potassium: 4.3 mmol/L (ref 3.5–5.1)
Sodium: 134 mmol/L — ABNORMAL LOW (ref 135–145)

## 2024-05-27 MED ORDER — POLYVINYL ALCOHOL 1.4 % OP SOLN
2.0000 [drp] | OPHTHALMIC | Status: DC | PRN
  Administered 2024-05-27 – 2024-05-29 (×3): 2 [drp] via OPHTHALMIC
  Filled 2024-05-27: qty 15

## 2024-05-27 MED ORDER — FUROSEMIDE 10 MG/ML IJ SOLN
20.0000 mg | Freq: Once | INTRAMUSCULAR | Status: AC
  Administered 2024-05-27: 20 mg via INTRAVENOUS
  Filled 2024-05-27: qty 2

## 2024-05-27 MED ORDER — SODIUM CHLORIDE 0.9 % IV SOLN
1.0000 g | INTRAVENOUS | Status: DC
  Administered 2024-05-27 – 2024-05-29 (×3): 1 g via INTRAVENOUS
  Filled 2024-05-27 (×3): qty 10

## 2024-05-27 MED ORDER — IPRATROPIUM-ALBUTEROL 0.5-2.5 (3) MG/3ML IN SOLN
3.0000 mL | RESPIRATORY_TRACT | Status: DC | PRN
  Administered 2024-05-27 – 2024-05-28 (×2): 3 mL via RESPIRATORY_TRACT
  Filled 2024-05-27 (×2): qty 3

## 2024-05-27 NOTE — Evaluation (Signed)
 Clinical/Bedside Swallow Evaluation Patient Details  Name: Tina Hampton MRN: [id number] Date of Birth: 1926-01-23  Today's Date: 05/27/2024 Time: SLP Start Time (ACUTE ONLY): 1637 SLP Stop Time (ACUTE ONLY): 1653 SLP Time Calculation (min) (ACUTE ONLY): 16 min  Past Medical History:  Past Medical History:  Diagnosis Date   Abnormal mammogram, unspecified 04/19/2011   Allergy    Anxiety    Backache, unspecified 01/21/2005   Cervicalgia 08/20/2013   Chronic kidney disease, stage II (mild) 04/03/2009   Chronic rhinitis 09/28/2010   DDD (degenerative disc disease)    Depression    Dermatophytosis of nail 01/21/2005   Diverticulosis of colon (without mention of hemorrhage) 06/17/2005   Edema 11/26/2004   GERD (gastroesophageal reflux disease)    Hiatal hernia    High cholesterol    Hyperglycemia 11/18/2014   Hypertension    Insomnia, unspecified 10/18/2011   Left inguinal hernia 04/24/2014   Nocturia 09/19/2008   Osteoporosis, senile 11/18/2014   Other abnormal blood chemistry 04/03/2007   Pain in joint, pelvic region and thigh 11/12/2011   Pain in joint, shoulder region 08/15/2006   Pain in limb 08/26/2005   Palpitations 09/29/2009   PAOD (peripheral arterial occlusive disease) 11/18/2014   Right leg; diminished popliteal, dorsalis pedis, and posterior tibial artery pulsations    Postmenopausal atrophic vaginitis 12/14/2004   Scoliosis    Senile osteoporosis 12/14/2004   Tension headache 10/04/2005   Unspecified cataract 11/26/2004   Unspecified constipation 01/21/2005   Unspecified urinary incontinence 11/26/2004   Vaginal stenosis 11/18/2014   Tight band about 1/2 inches into the vagina which will not allow passage of my index finger.    Past Surgical History:  Past Surgical History:  Procedure Laterality Date   ABDOMINAL HYSTERECTOMY  1969   Dr.Braun    bilateral eyelid surgery  2005   Dr.Scott   BILATERAL OOPHORECTOMY  2002   BREAST SURGERY     benign tumor removal, left breast 1969    CATARACT EXTRACTION     Right   CHOLECYSTECTOMY  1990   Dr.Moore    COLONOSCOPY  3/18/20008   inflammation, diverticular associated colitis -Dr. Teressa   COSMETIC SURGERY     ESOPHAGOGASTRODUODENOSCOPY N/A 01/24/2014   Procedure: ESOPHAGOGASTRODUODENOSCOPY (EGD);  Surgeon: Norleen LOISE Kiang, MD;  Location: [facility] ENDOSCOPY;  Service: Endoscopy;  Laterality: N/A;   ESOPHAGOGASTRODUODENOSCOPY ENDOSCOPY  04/04/14   Dr. Teressa   EYE SURGERY     FOREIGN BODY REMOVAL N/A 01/24/2014   Procedure: FOREIGN BODY REMOVAL;  Surgeon: Norleen LOISE Kiang, MD;  Location: [facility] ENDOSCOPY;  Service: Endoscopy;  Laterality: N/A;   HIP PINNING,CANNULATED  11/07/2011   Procedure: CANNULATED HIP PINNING;  Surgeon: Lynwood FORBES Better, MD;  Location: [facility] ORS;  Service: Orthopedics;  Laterality: Left;   ORIF FEMORAL NECK FRACTURE W/ [facility]  11/07/2011   Dr.Nitka    TONSILLECTOMY     Dr.Joe Claudene   TUBAL LIGATION     VAGINAL PROLAPSE REPAIR  2002   Dr.Horback    HPI:  Pt is 89 year old presented [facility] for fall and AMS. Admitted with UTI. CT C/A/P shows a large hiatal hernia but no focal consolidation or pulmonary edema. PMH: HTN, osteoporosis,DDD, depression, hip fx, GERD    Assessment / Plan / Recommendation  Clinical Impression  Continue current diet with meds whole in puree. Will f/u to complete an MBS as scheduling allows.   Pt is alert and self-feeding given assistance. She endorses difficulty swallowing but neither she nor  her son can elaborate. She reports significant pain when coughing, which intermittently follows sips of thin liquids and lacks force. Oral transit is prompt with purees and regular solids. Called her son to discuss her presentation and he is agreeable with proceeding with an MBS for objective assessment. Suspect large hiatal hernia, history of GERD, and acute AMS secondary to UTI may be contributing to pt's presentation.   SLP Visit Diagnosis: Dysphagia, unspecified (R13.10)    Aspiration Risk  Mild  aspiration risk    Diet Recommendation           Other Recommendations Oral Care Recommendations: Oral care BID     Swallow Evaluation Recommendations Recommendations: PO diet PO Diet Recommendation: Regular;Thin liquids (Level 0) Liquid Administration via: Cup;Straw Medication Administration: Whole meds with puree Supervision: Full assist for feeding;Full supervision/cueing for swallowing strategies Swallowing strategies  : Minimize environmental distractions;Slow rate;Small bites/sips Postural changes: Position pt fully upright for meals;Stay upright 30-60 min after meals Oral care recommendations: Oral care BID (2x/day) Recommended consults: Consider Palliative care   Assistance Recommended at Discharge    Functional Status Assessment    Frequency and Duration            Prognosis Prognosis for improved oropharyngeal function: Fair Barriers to Reach Goals: Time post onset      Swallow Study   General HPI: Pt is 89 year old presented to [facility] on  05/26/24 for fall and AMS. Admitted with UTI. CT C/A/P shows a large hiatal hernia but no focal consolidation or pulmonary edema. PMH: HTN, osteoporosis,DDD, depression, hip fx, GERD Type of Study: Bedside Swallow Evaluation Previous Swallow Assessment: none in chart Diet Prior to this Study: Regular;Thin liquids (Level 0) Temperature Spikes Noted: No Respiratory Status: Room air History of Recent Intubation: No Behavior/Cognition: Alert;Cooperative;Requires cueing Oral Cavity Assessment: Within Functional Limits Oral Care Completed by SLP: No Oral Cavity - Dentition: Adequate natural dentition Vision: Functional for self-feeding Self-Feeding Abilities: Needs assist Patient Positioning: Upright in bed Baseline Vocal Quality: Normal Volitional Cough: Weak Volitional Swallow: Able to elicit    Oral/Motor/Sensory Function Overall Oral Motor/Sensory Function: Within functional limits   Ice Chips Ice chips: Not tested   Thin  Liquid Thin Liquid: Impaired Presentation: Straw Pharyngeal  Phase Impairments: Cough - Immediate    Nectar Thick Nectar Thick Liquid: Not tested   Honey Thick Honey Thick Liquid: Not tested   Puree Puree: Within functional limits Presentation: Spoon   Solid     Solid: Within functional limits      Damien Blumenthal, M.A., CCC-SLP Speech Language Pathology, Acute Rehabilitation Services  Secure Chat preferred [phone]  05/27/2024,5:07 PM

## 2024-05-27 NOTE — ED Notes (Signed)
 Attempted to give tylenol  as patient expressed pain, however patient was unable to swallow the meds. She started choking. Was able to get patient to spit out tylenol . Patient is now extremely tachypneic.

## 2024-05-27 NOTE — ED Notes (Addendum)
 Pt asking for water. Pt was not able to drink out of the straw but was able to drink small sips from the cup. Pt asking to stand up. Pt advised she can not get out of bed at this time. Pt alert to self only. Pt seems very anxious at this time. MD Amponsah messaged regarding anxiety.

## 2024-05-27 NOTE — Plan of Care (Signed)
   Problem: Education: Goal: Knowledge of General Education information will improve Description Including pain rating scale, medication(s)/side effects and non-pharmacologic comfort measures Outcome: Progressing

## 2024-05-27 NOTE — ED Notes (Signed)
 Patient attempted to get out of bed, alert to self. Complains of pain to right side. Patient requested water and seems anxious with difficulty drinking liquid without choking at this time if allowed large amounts.

## 2024-05-27 NOTE — ED Notes (Signed)
 Floor notified patient coming up

## 2024-05-27 NOTE — Evaluation (Signed)
 Physical Therapy Evaluation Patient Details Name: Tina Hampton MRN: [id number] DOB: [birth year] Today's Date: 05/27/2024  History of Present Illness  Pt is 89 year old presented to [facility] on  05/26/24 for fall and AMS. Pt with UTI. PMH - HTN, osteoporosis,DDD, depression, hip fx  Clinical Impression  Pt presents with dependencies in all mobility and very low tolerance to activity today. I believe has been in ALF setting and has plans to move to facility in [place] . At this time will need assist with every aspect of care. Patient will benefit from continued inpatient follow up therapy, <3 hours/day if she is able to show progress. Not sure she has much reserves.         If plan is discharge home, recommend the following: Two people to help with walking and/or transfers;A lot of help with bathing/dressing/bathroom;Assistance with cooking/housework   Can travel by private vehicle   No    Equipment Recommendations Wheelchair (measurements PT);Wheelchair cushion (measurements PT)  Recommendations for Other Services       Functional Status Assessment Patient has had a recent decline in their functional status and demonstrates the ability to make significant improvements in function in a reasonable and predictable amount of time.     Precautions / Restrictions Precautions Precautions: Fall Recall of Precautions/Restrictions: Impaired Restrictions Weight Bearing Restrictions Per Provider Order: No      Mobility  Bed Mobility Overal bed mobility: Needs Assistance Bed Mobility: Supine to Sit, Sit to Supine     Supine to sit: Max assist, HOB elevated Sit to supine: Max assist, HOB elevated   General bed mobility comments: Assist with all aspect    Transfers                   General transfer comment: Did not attempt    Ambulation/Gait                  Stairs            Wheelchair Mobility     Tilt Bed    Modified Rankin (Stroke Patients Only)        Balance Overall balance assessment: Needs assistance Sitting-balance support: Bilateral upper extremity supported, Feet unsupported Sitting balance-Leahy Scale: Poor Sitting balance - Comments: Min assist due to posterior lean Postural control: Posterior lean                                   Pertinent Vitals/Pain Pain Assessment Pain Assessment: Faces Faces Pain Scale: Hurts little more Pain Location: all over Pain Descriptors / Indicators: Grimacing, Moaning Pain Intervention(s): Limited activity within patient's tolerance, Monitored during session, Patient requesting pain meds-RN notified    Home Living Family/patient expects to be discharged to:: Skilled nursing facility                   Additional Comments: Pt has been residing at [facility]. Believe she has been in ALF level but not sure. Per chart pt to move to some facility in [place]  soon.    Prior Function Prior Level of Function : Patient poor historian/Family not available             Mobility Comments: Per chart pt ambulatory with walker. Believe pt at ALF level but unclear.       Extremity/Trunk Assessment   Upper Extremity Assessment Upper Extremity Assessment: Defer to OT evaluation    Lower Extremity Assessment  Lower Extremity Assessment: Generalized weakness       Communication   Communication Communication: Impaired Factors Affecting Communication: Hearing impaired    Cognition Arousal: Alert Behavior During Therapy: Restless   PT - Cognitive impairments: No family/caregiver present to determine baseline, Orientation, Memory, Attention, Initiation, Problem solving   Orientation impairments: Time, Situation, Place                     Following commands: Impaired Following commands impaired: Follows one step commands with increased time, Follows one step commands inconsistently     Cueing Cueing Techniques: Verbal cues, Tactile cues      General Comments General comments (skin integrity, edema, etc.): RR 30's. SpO2 95% on 2L. Wheezing noted.    Exercises     Assessment/Plan    PT Assessment Patient needs continued PT services  PT Problem List Decreased strength;Decreased activity tolerance;Decreased balance;Decreased mobility;Cardiopulmonary status limiting activity       PT Treatment Interventions DME instruction;Gait training;Functional mobility training;Therapeutic activities;Therapeutic exercise;Balance training;Patient/family education    PT Goals (Current goals can be found in the Care Plan section)  Acute Rehab PT Goals Patient Stated Goal: Pt unable to state PT Goal Formulation: Patient unable to participate in goal setting Time For Goal Achievement: 06/10/24 Potential to Achieve Goals: Fair    Frequency Min 1X/week     Co-evaluation               AM-PAC PT 6 Clicks Mobility  Outcome Measure Help needed turning from your back to your side while in a flat bed without using bedrails?: A Lot Help needed moving from lying on your back to sitting on the side of a flat bed without using bedrails?: A Lot Help needed moving to and from a bed to a chair (including a wheelchair)?: Total Help needed standing up from a chair using your arms (e.g., wheelchair or bedside chair)?: Total Help needed to walk in hospital room?: Total Help needed climbing 3-5 steps with a railing? : Total 6 Click Score: 8    End of Session Equipment Utilized During Treatment: Oxygen Activity Tolerance: Patient limited by fatigue Patient left: in bed;with call bell/phone within reach Nurse Communication: Patient requests pain meds PT Visit Diagnosis: Other abnormalities of gait and mobility (R26.89);Muscle weakness (generalized) (M62.81);History of falling (Z91.81)    Time: 8857-8845 PT Time Calculation (min) (ACUTE ONLY): 12 min   Charges:   PT Evaluation $PT Eval Moderate Complexity: 1 Mod   PT General Charges $$  ACUTE PT VISIT: 1 Visit         [facility] PT Acute Rehabilitation Services Office [phone]   Rodgers ORN [facility] 05/27/2024, 2:32 PM

## 2024-05-27 NOTE — Progress Notes (Signed)
 " Progress Note   Patient: Tina Hampton FMW:[id number] DOB: Jan 27, 1926 DOA: 05/26/2024     1 DOS: the patient was seen and examined on 05/27/2024   Brief hospital course: Tina Hampton is a 89 y.o. female with medical history significant for HTN, HLD, DDD, GAD, depression, insomnia, osteoporosis, GERD, PAD, overactive bladder and CKD stage II who [facility] for evaluation of generalized weakness, altered mental status and recurrent falls.  Patient is admitted to the hospitalist service for further management evaluation of altered mental status, UTI.  Assessment and Plan: Acute metabolic encephalopathy- Patient has been more lethargic and disoriented. Mental status change in the setting of UTI. Continue IV antibiotic therapy. Fall, aspiration precautions. Delirium precautions.  Acute cystitis: UA abnormal, presented with altered mental status. Continue IV Rocephin  therapy. Follow urine cultures.  Acute kidney injury: In the setting of dehydration, UTI. Patient got gentle IV fluids, IV fluid boluses in the ED. Kidney function improving. Trend renal function, avoid nephrotoxic drugs.  Generalized weakness: Recurrent falls- She is very weak, drowsy. PT OT evaluation suggested SNF placement. Continue fall precautions.  Generalized anxiety disorder- Hold off on anxiolytics, antidepressants at this time.  Hypertension- Hold nifedipine  due to low blood pressures.  Hyperlipidemia- Resume pravastatin .  GERD- Resume PPI      Out of bed to chair. Incentive spirometry. Nursing supportive care. Fall, aspiration precautions. Diet:  Diet Orders (From admission, onward)     Start     Ordered   05/26/24 2224  Diet regular Room service appropriate? Yes; Fluid consistency: Thin  Diet effective now       Question Answer Comment  Room service appropriate? Yes   Fluid consistency: Thin      05/26/24 2223           DVT  prophylaxis: enoxaparin  (LOVENOX ) injection 30 mg Start: 05/27/24 1000  Level of care: Med-Surg   Code Status: Full Code  Subjective: Patient is seen and examined today morning.  She is more sleepy and lethargic.  Tachypnea noted.  She is on 150 mL of normal saline per hour which I stopped.  No family at bedside.  Physical Exam: Vitals:   05/27/24 1015 05/27/24 1141 05/27/24 1145 05/27/24 1200  BP: (!) 120/56  122/76   Pulse: 87  95 94  Resp: (!) 26  (!) 27 (!) 35  Temp:  99.5 F (37.5 C)    TempSrc:  Oral    SpO2: 98%  96% 95%    General - Elderly obese Caucasian female, respiratory distress HEENT - PERRLA, EOMI, atraumatic head, non tender sinuses. Lung - Clear, diffuse rales, rhonchi, wheezes. Heart - S1, S2 heard, no murmurs, rubs, trace pedal edema. Abdomen - Soft, non tender, obese, bowel sounds good Neuro - Alert, awake and oriented x 3, non focal exam. Skin - Warm and dry.  Data Reviewed:      Latest Ref Rng & Units 05/27/2024    2:37 AM 05/26/2024    6:00 PM 05/26/2024    4:49 PM  CBC  WBC 4.0 - 10.5 K/uL 14.0   16.4   Hemoglobin 12.0 - 15.0 g/dL 89.4  87.7  87.9   Hematocrit 36.0 - 46.0 % 32.8  36.0  37.7   Platelets 150 - 400 K/uL 126   145       Latest Ref Rng & Units 05/27/2024    2:37 AM 05/26/2024    6:00 PM 05/26/2024    4:49 PM  BMP  Glucose 70 - 99 mg/dL 889  879  879   BUN 8 - 23 mg/dL 33  38  36   Creatinine 0.44 - 1.00 mg/dL 8.80  8.29  8.40   Sodium 135 - 145 mmol/L 134  135  134   Potassium 3.5 - 5.1 mmol/L 4.3  4.3  4.4   Chloride 98 - 111 mmol/L 101  98  98   CO2 22 - 32 mmol/L 23   25   Calcium  8.9 - 10.3 mg/dL 9.5   89.4    CT CERVICAL SPINE WO CONTRAST Result Date: 05/26/2024 EXAM: CT CERVICAL SPINE WITHOUT CONTRAST 05/26/2024 07:11:31 PM TECHNIQUE: CT of the cervical spine was performed without the administration of intravenous contrast. Multiplanar reformatted images are provided for review. Automated exposure control, iterative  reconstruction, and/or weight based adjustment of the mA/kV was utilized to reduce the radiation dose to as low as reasonably achievable. COMPARISON: None available. CLINICAL HISTORY: Polytrauma, blunt. FINDINGS: BONES AND ALIGNMENT: 3 mm anterolisthesis C7-T1, likely degenerative in nature. Degenerative ankylosis C4-C5. Ankylosis of the facet joints of C3-C5 bilaterally. No acute fracture or traumatic listhesis. Motion degraded examination. DEGENERATIVE CHANGES: Advanced degenerative disc disease throughout the cervical spine with disc space narrowing and endplate remodeling. 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 Multilevel uncovertebral and facet arthrosis results in multilevel moderate-to-severe neuroforaminal narrowing, most severe at C5-C6 and C6-C7. SOFT TISSUES: No prevertebral soft tissue swelling. IMPRESSION: 1. No acute fracture or traumatic listhesis. 2. Motion degraded examination. 3. 3 mm anterolisthesis at C7-T1, likely degenerative. 4. Advanced degenerative disc disease throughout the cervical spine with disc space narrowing and endplate remodeling. 5. Ankylosis of the facet joints of C3-C5 bilaterally. 6. Degenerative ankylosis at C4-5. 7. Moderate central canal stenosis at C4-5 secondary to posterior disc osteophyte complex and congenital narrowing of the pedicles. 8. Multilevel moderate-to-severe neuroforaminal narrowing, most severe at C5-6 and C6-7. Electronically signed by: Dorethia Molt MD MD 05/26/2024 07:50 PM EST RP Workstation: [id number]   CT CHEST ABDOMEN PELVIS WO CONTRAST Result Date: 05/26/2024 EXAM: CT CHEST, ABDOMEN AND PELVIS WITHOUT CONTRAST 05/26/2024 07:11:31 PM TECHNIQUE: CT of the chest, abdomen and pelvis was performed without the administration of intravenous contrast. Multiplanar reformatted images are provided for review. Automated exposure control, iterative reconstruction,  and/or weight based adjustment of the mA/kV was utilized to reduce the radiation dose to as low as reasonably achievable. COMPARISON: 04/20/2014 CLINICAL HISTORY: Polytrauma, blunt. Multiple unwitnessed fall, bruising, altered mental status. FINDINGS: CHEST: MEDIASTINUM AND LYMPH NODES: Heart and pericardium are unremarkable. Extensive multivessel coronary artery calcification. Extensive calcification of the aortic valve leaflets. The central pulmonary arteries are enlarged in keeping with changes of pulmonary arterial hypertension. Moderate atherosclerotic calcification within the thoracic aorta. 22 mm nodule within the left thyroid  lobe, not well assessed this examination. No follow-up imaging recommended unless clinically indicated. The central airways are clear. No mediastinal, hilar or axillary lymphadenopathy. LUNGS AND PLEURA: Large hiatal hernia, and large since prior examination, with the entire stomach, distal pancreas and the distal transverse and proximal descending colon within the thoracic compartment. 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 No focal consolidation or pulmonary edema. No pleural effusion. No pneumothorax. ABDOMEN AND PELVIS: LIVER: Unremarkable. GALLBLADDER AND BILE DUCTS: Status post cholecystectomy. No biliary ductal dilatation. SPLEEN: No acute abnormality. PANCREAS: No acute abnormality. ADRENAL GLANDS: No acute abnormality. KIDNEYS, URETERS AND BLADDER: Multiple nonobstructing calculi are seen within the right renal pelvis  measuring 4 and 10 mm. There is superimposed mild asymmetric right perinephric and peripelvic stranding. Perinephric and peripelvic stranding from the right may reflect residual of a recently passed renal calculus. Alternatively, inflammatory condition such as an ascending urinary tract infection could appear similarly. Correlation with urinalysis and urine culture may be helpful for further management. No hydronephrosis. The left kidney is  unremarkable; no intrarenal or ureteral calculi are identified and there is no hydronephrosis. Several granular dependently layering calcifications are seen within the bladder lumen measuring 2-3 mm in size. The bladder is partially decompressed. There is, however, circumferential bladder wall thickening noted which may reflect changes of underlying cystitis or chronic bladder outlet obstruction. GI AND BOWEL: Severe descending and sigmoid colonic diverticulosis without superimposed acute inflammatory change. The visualized bowel is otherwise unremarkable. No evidence of obstruction. Appendix normal. REPRODUCTIVE ORGANS: Status post hysterectomy. No acute abnormality. PERITONEUM AND RETROPERITONEUM: No ascites. No free air. VASCULATURE: Aorta is normal in caliber. Moderate aortoiliac atherosclerotic calcification. ABDOMINAL AND PELVIS LYMPH NODES: No lymphadenopathy. BONES AND SOFT TISSUES: Left hip pinning has been performed. Severe lumbar dextroscoliosis. Osseous structures are age appropriate with advanced degenerative changes seen within the thoracolumbar spine. No focal soft tissue abnormality. Had some masses. IMPRESSION: 1. Large hiatal hernia with organoaxial gastric volvulus without evidence of obstruction. 2. Multiple nonobstructing calculi in the right renal pelvis with mild asymmetric right perinephric and peripelvic stranding, which may reflect residual of a recently passed renal calculus or an ascending urinary tract infection; urinalysis and urine culture may be helpful for further management. 3. Circumferential bladder wall thickening, which may reflect cystitis or chronic bladder outlet obstruction. 4. Severe descending and sigmoid colonic diverticulosis without acute inflammatory change. 5. Enlarged central pulmonary arteries, in keeping with pulmonary arterial hypertension. 6. Extensive multivessel coronary artery calcification and extensive calcification of the aortic valve leaflets. 7. Left  thyroid  nodule measuring 22 mm, not well assessed on this examination; no follow-up imaging recommended unless clinically indicated. 8. Severe lumbar dextroscoliosis with advanced degenerative changes of the thoracolumbar spine. 9. Raf score includes aortic atherosclerosis (ICD10-I70.0). Electronically signed by: Dorethia Molt MD MD 05/26/2024 07:45 PM EST RP Workstation: [id number]   CT T-SPINE NO CHARGE Result Date: 05/26/2024 EXAM: CT THORACIC SPINE WITHOUT CONTRAST 05/26/2024 07:11:31 PM TECHNIQUE: CT of the thoracic spine was performed without the administration of intravenous contrast. Multiplanar reformatted images are provided for review. Automated exposure control, iterative reconstruction, and/or weight based adjustment of the mA/kV was utilized to reduce the radiation dose to as low as reasonably achievable. COMPARISON: None available. CLINICAL HISTORY: FINDINGS: BONES AND ALIGNMENT: Normal vertebral body heights. 2 mm anterolisthesis T2-3, likely degenerative in nature. No acute fracture or traumatic listhesis of the thoracic spine. No suspicious bone lesion. DEGENERATIVE CHANGES: Intervertebral disc space narrowing and endplate remodeling throughout the thoracic spine in keeping with changes of moderate-to-severe degenerative disc disease. No high-grade canal stenosis. Moderate bilateral neuroforaminal narrowing at T8-9. No high-grade neural foraminal narrowing identified. SOFT TISSUES: Left lower lobe consolidation, better assessed on accompanying CT examination of the chest, abdomen, and pelvis. Please refer to that report for further details. IMPRESSION: 1. No acute fracture or traumatic listhesis of the thoracic spine. 2. Left lower lobe consolidation. 3. Moderate-to-severe degenerative disc disease throughout the thoracic spine, without high-grade canal stenosis. 4. Moderate bilateral neuroforaminal narrowing at T8-9. 5. Minimal anterolisthesis at T2-3, likely degenerative. Electronically signed  by: Dorethia Molt MD MD 05/26/2024 07:33 PM EST RP Workstation: [id number]   CT L-SPINE NO CHARGE Result  Date: 05/26/2024 EXAM: CT OF THE LUMBAR SPINE WITHOUT CONTRAST 05/26/2024 07:11:31 PM TECHNIQUE: CT of the lumbar spine was performed without the administration of intravenous contrast. Multiplanar reformatted images are provided for review. Automated exposure control, iterative reconstruction, and/or weight based adjustment of the mA/kV was utilized to reduce the radiation dose to as low as reasonably achievable. COMPARISON: None available. CLINICAL HISTORY: FINDINGS: BONES AND ALIGNMENT: Normal vertebral body heights. No acute fracture or suspicious bone lesion. No traumatic listhesis. Severe lumbar dextroscoliosis, apex right at L3. Mild loss of the normal lumbar lordosis. There is degenerative ankylosis of the L2 and L3 vertebral bodies along the left lateral aspect. DEGENERATIVE CHANGES: There is diffuse intervertebral disc space narrowing, endplate remodeling, and vacuum disc phenomena throughout the lumbar spine in keeping with changes of severe degenerative disc disease. Multifactorial moderate canal stenosis at L3-4 and L4-5. Severe right neuroforaminal narrowing at L4-5 and L5-S1 secondary to disc height loss and osteophytic ridging and facet arthrosis. Moderate-to-severe left neuroforaminal narrowing at L3-4 and L5-S1. SOFT TISSUES: Multiple nonobstructing calculi within the right kidney measuring up to 11 mm. Moderate aortoiliac atherosclerotic calcification. Severe sigmoid diverticulosis noted. IMPRESSION: 1. No acute fracture or traumatic listhesis. 2. Severe lumbar dextroscoliosis, apex right at L3, with mild loss of the normal lumbar lordosis. 3. Severe degenerative disc disease with diffuse intervertebral disc space narrowing, endplate remodeling, and vacuum disc phenomena throughout the lumbar spine, with degenerative ankylosis of the L2 and L3 vertebral bodies along the left lateral aspect.  4. Multifactorial moderate canal stenosis at L3-4 and L4-5. 5. Severe right neuroforaminal narrowing at L4-5 and L5-S1, with moderate-to-severe left neuroforaminal narrowing at L3-4 and L5-S1. 6. Multiple nonobstructing right renal calculi measuring up to 11 mm. 7. Moderate aortoiliac atherosclerotic calcification. 8. Severe sigmoid diverticulosis. Electronically signed by: Dorethia Molt MD MD 05/26/2024 07:30 PM EST RP Workstation: [id number]   CT HEAD WO CONTRAST Result Date: 05/26/2024 EXAM: CT HEAD WITHOUT CONTRAST 05/26/2024 07:11:31 PM TECHNIQUE: CT of the head was performed without the administration of intravenous contrast. Automated exposure control, iterative reconstruction, and/or weight based adjustment of the mA/kV was utilized to reduce the radiation dose to as low as reasonably achievable. COMPARISON: None available. CLINICAL HISTORY: Head trauma, moderate-severe. FINDINGS: BRAIN AND VENTRICLES: No acute hemorrhage. No evidence of acute infarct. No hydrocephalus. No extra-axial collection. No mass effect or midline shift. Cerebral volume loss. Moderate patchy supratentorial white matter hypodensities. ORBITS: No acute abnormality. SINUSES: Moderate mucosal thickening in left ethmoid sinus. SOFT TISSUES AND SKULL: No acute soft tissue abnormality. No skull fracture. VASCULATURE: Atherosclerosis. LIMITATIONS/ARTIFACTS: Motion artifact. IMPRESSION: 1. No acute intracranial abnormality. 2. Chronic cerebral volume loss and moderate chronic supratentorial white matter change. 3. Atherosclerosis. Electronically signed by: Dorethia Molt MD MD 05/26/2024 07:17 PM EST RP Workstation: [id number]    Family Communication: Discussed with patient's son, his brother is POA who will be here tomorrow. He understand and agree. All questions answered.  Disposition: Status is: Inpatient Remains inpatient appropriate because: IV antibiotics, monitor mental status, palliative consult.  Planned Discharge  Destination: Skilled nursing facility     Time spent: 51 minutes  Author: Concepcion Riser, MD 05/27/2024 12:30 PM Secure chat 7am to 7pm For on call review [email or web address].    "

## 2024-05-28 ENCOUNTER — Inpatient Hospital Stay (HOSPITAL_COMMUNITY)

## 2024-05-28 DIAGNOSIS — R5381 Other malaise: Secondary | ICD-10-CM | POA: Insufficient documentation

## 2024-05-28 DIAGNOSIS — N3001 Acute cystitis with hematuria: Secondary | ICD-10-CM

## 2024-05-28 DIAGNOSIS — G9341 Metabolic encephalopathy: Secondary | ICD-10-CM

## 2024-05-28 DIAGNOSIS — N179 Acute kidney failure, unspecified: Secondary | ICD-10-CM | POA: Diagnosis not present

## 2024-05-28 DIAGNOSIS — Z7189 Other specified counseling: Secondary | ICD-10-CM

## 2024-05-28 DIAGNOSIS — Z515 Encounter for palliative care: Secondary | ICD-10-CM

## 2024-05-28 LAB — BLOOD CULTURE ID PANEL (REFLEXED) - BCID2

## 2024-05-28 LAB — CBC
HCT: 30.8 % — ABNORMAL LOW (ref 36.0–46.0)
Hemoglobin: 10.1 g/dL — ABNORMAL LOW (ref 12.0–15.0)
MCH: 30.1 pg (ref 26.0–34.0)
MCHC: 32.8 g/dL (ref 30.0–36.0)
MCV: 91.7 fL (ref 80.0–100.0)
Platelets: 107 K/uL — ABNORMAL LOW (ref 150–400)
RBC: 3.36 MIL/uL — ABNORMAL LOW (ref 3.87–5.11)
RDW: 12.8 % (ref 11.5–15.5)
WBC: 9 K/uL (ref 4.0–10.5)
nRBC: 0 % (ref 0.0–0.2)

## 2024-05-28 LAB — CULTURE, BLOOD (ROUTINE X 2): Culture  Setup Time: NO GROWTH

## 2024-05-28 LAB — BASIC METABOLIC PANEL WITH GFR
Anion gap: 10 (ref 5–15)
BUN: 28 mg/dL — ABNORMAL HIGH (ref 8–23)
CO2: 25 mmol/L (ref 22–32)
Calcium: 9.2 mg/dL (ref 8.9–10.3)
Chloride: 101 mmol/L (ref 98–111)
Creatinine, Ser: 0.96 mg/dL (ref 0.44–1.00)
GFR, Estimated: 54 mL/min — ABNORMAL LOW
Glucose, Bld: 90 mg/dL (ref 70–99)
Potassium: 3.8 mmol/L (ref 3.5–5.1)
Sodium: 136 mmol/L (ref 135–145)

## 2024-05-28 LAB — URINE CULTURE

## 2024-05-28 NOTE — Progress Notes (Addendum)
 Transition of Care [facility]) - Inpatient Brief Assessment   Patient Details  Name: Finn Altemose MRN: [id number] Date of Birth: 1925/10/18  Transition of Care [facility]) CM/SW Contact:    Rosaline JONELLE Joe, RN Phone Number: 05/28/2024, 9:38 AM   Clinical Narrative: Patient admitted to the hospital from [facility] with UTI.   I called and spoke with the patient's son, Josiane Labine, son and he states that his brother, Rainna Nearhood is PRODUCT MANAGER and is flying in from [place]  today to discuss SNF placement for the patient.  FL2 completed and MSW to be updated regarding family plans to visit to help coordinate discharge plan for the patient when son arrives from out of town today.  05/28/24 1627 - CM spoke with the patient's son, Bernardette Waldron, [place] at the bedside and the son states that he would like to have patient eventually transition to a ALF bed at [facility] in [place], [place].  I explained to the patient that PT/OT has recommended SNF placement at this time and it would be advisable that patient to discharge to [facility] SNF first before being transported to her ALF out of state.  The patient's son was agreeable.  FL2 was sent to [facility] for review.  I spoke with Jeoffrey, CM at [facility] and she plans to speak with the MSW at [facility] ALF and then follow up with the patient's son to determine which [facility] SNF facility would be best for the patient's rehabilitation services.  I asked MD to please call and speak with the son to provide a medical update.   Transition of Care Asessment: Insurance and Status: (P) Insurance coverage has been reviewed Patient has primary care physician: (P) Yes Home environment has been reviewed: (P) from [facility] Prior level of function:: (P) self prior to admission Prior/Current Home Services: (P) No current home services Social Drivers of Health Review: (P) SDOH reviewed needs interventions Readmission  risk has been reviewed: (P) Yes Transition of care needs: (P) transition of care needs identified, TOC will continue to follow

## 2024-05-28 NOTE — Progress Notes (Signed)
 Chaplain responded to AD consult, but was unable to meet with pt as she was with the physical therapist. Our team will continue to follow.  Alan HERO. Davee Lomax, M.Div. [facility] Chaplain Pager [phone] Office [phone]

## 2024-05-28 NOTE — Progress Notes (Signed)
 PHARMACY - PHYSICIAN COMMUNICATION CRITICAL VALUE ALERT - BLOOD CULTURE IDENTIFICATION (BCID)  Tina Hampton is an 89 y.o. female who presented to [facility] on 05/26/2024   Name of physician (or Provider) Contacted: Dr. Charlton  Current antibiotics: Ceftriaxone   Changes to prescribed antibiotics recommended:  No changes  Results for orders placed or performed during the hospital encounter of 05/26/24  Blood Culture ID Panel (Reflexed) (Collected: 05/26/2024  4:55 PM)  Result Value Ref Range   Enterococcus faecalis NOT DETECTED NOT DETECTED   Enterococcus Faecium NOT DETECTED NOT DETECTED   Listeria monocytogenes NOT DETECTED NOT DETECTED   Staphylococcus species DETECTED (A) NOT DETECTED   Staphylococcus aureus (BCID) NOT DETECTED NOT DETECTED   Staphylococcus epidermidis NOT DETECTED NOT DETECTED   Staphylococcus lugdunensis NOT DETECTED NOT DETECTED   Streptococcus species NOT DETECTED NOT DETECTED   Streptococcus agalactiae NOT DETECTED NOT DETECTED   Streptococcus pneumoniae NOT DETECTED NOT DETECTED   Streptococcus pyogenes NOT DETECTED NOT DETECTED   A.calcoaceticus-baumannii NOT DETECTED NOT DETECTED   Bacteroides fragilis NOT DETECTED NOT DETECTED   Enterobacterales NOT DETECTED NOT DETECTED   Enterobacter cloacae complex NOT DETECTED NOT DETECTED   Escherichia coli NOT DETECTED NOT DETECTED   Klebsiella aerogenes NOT DETECTED NOT DETECTED   Klebsiella oxytoca NOT DETECTED NOT DETECTED   Klebsiella pneumoniae NOT DETECTED NOT DETECTED   Proteus species NOT DETECTED NOT DETECTED   Salmonella species NOT DETECTED NOT DETECTED   Serratia marcescens NOT DETECTED NOT DETECTED   Haemophilus influenzae NOT DETECTED NOT DETECTED   Neisseria meningitidis NOT DETECTED NOT DETECTED   Pseudomonas aeruginosa NOT DETECTED NOT DETECTED   Stenotrophomonas maltophilia NOT DETECTED NOT DETECTED   Candida albicans NOT DETECTED NOT DETECTED   Candida auris NOT DETECTED NOT DETECTED    Candida glabrata NOT DETECTED NOT DETECTED   Candida krusei NOT DETECTED NOT DETECTED   Candida parapsilosis NOT DETECTED NOT DETECTED   Candida tropicalis NOT DETECTED NOT DETECTED   Cryptococcus neoformans/gattii NOT DETECTED NOT DETECTED    Clair Agent 05/28/2024  5:26 AM

## 2024-05-28 NOTE — Progress Notes (Signed)
 " Progress Note    Tina Hampton   FMW:[id number]  DOB: 12/28/1925  DOA: 05/26/2024     2 PCP: [name], MD  Initial CC: Weakness, falls, confusion  Hospital Course: Tina Hampton is a 89 y.o. female with medical history significant for HTN, HLD, DDD, GAD, depression, insomnia, osteoporosis, GERD, PAD, overactive bladder and CKD stage II who presented via EMS from [facility] due to generalized weakness, altered mental status and recurrent falls.      Assessment and Plan:  Acute metabolic encephalopathy Patient has been more lethargic and disoriented. Mental status change in the setting of UTI and AKI - Has improved and returned back to normal baseline mentation  Acute cystitis UA abnormal, presented with altered mental status. -Urine culture equivocal; will finish empiric course of antibiotics -Continue on Rocephin  for 1 more day, then likely transition to oral to complete  AKI -Attributed to poor intake and UTI on admission - baseline creatinine ~ 0.9 - patient presents with increase in creat >0.3 mg/dL above baseline or creat increase >1.5x baseline presumed to have occurred within past 7 days PTA - Has improved back to baseline with fluids.  Peaked at 1.7  Physical deconditioning -Weakness due to UTI and AKI on admission -Resides at assisted living.  Family reports trying to transition her to moving to [place]  with potentially private home caretaker -Continue working with PT/OT while hospitalized   Hypertension Hold nifedipine  due to low blood pressures.   Hyperlipidemia Resume pravastatin    GERD Resume PPI  Interval History:  No events overnight.  Son present at bedside this morning.  Patient mentation has improved and back to normal. Potentially moving to [place]  with other son and will have caretaker at home   Antimicrobials: Cefepime  05/26/2024 x 1 Rocephin  05/27/2024 >> current  Consultants:    Procedures:    DVT prophylaxis:   enoxaparin  (LOVENOX ) injection 30 mg Start: 05/27/24 1000   Code Status:   Code Status: Full Code  Barriers to discharge: None Therapy evaluation: PT Orders: Active   PT Follow up Rec: Skilled Nursing-Short Term Rehab (<3 Hours/Day)05/27/2024 1400  Disposition Plan: Home with 24/7 care versus SNF Status is: Inpatient  Mobility Assessment (Last 72 Hours)     Mobility Assessment     Row Name 05/28/24 1100 05/27/24 1554 05/27/24 1400       Does the patient have exclusion criteria? -- No- Perform mobility assessment --     What is the highest level of mobility based on the mobility assessment? Level 2 (Chairfast) - Balance while sitting on edge of bed and cannot stand Level 1 (Bedfast) - Unable to balance while sitting on edge of bed Level 1 (Bedfast) - Unable to balance while sitting on edge of bed     Is the above level different from baseline mobility prior to current illness? -- Yes - Recommend PT order --        Diet: Diet Orders (From admission, onward)     Start     Ordered   05/28/24 1122  Diet regular Room service appropriate? Yes; Fluid consistency: Thin  Diet effective now       Comments: Meds whole with puree  Question Answer Comment  Room service appropriate? Yes   Fluid consistency: Thin      05/28/24 1122            Objective: Blood pressure 122/74, pulse 92, temperature 97.7 F (36.5 C), resp. rate 20, height 5' 2 (1.575 m), SpO2  93%.  Examination:  Physical Exam Constitutional:      Appearance: Normal appearance.  HENT:     Head: Normocephalic and atraumatic.     Mouth/Throat:     Mouth: Mucous membranes are moist.  Eyes:     Extraocular Movements: Extraocular movements intact.  Cardiovascular:     Rate and Rhythm: Normal rate and regular rhythm.  Pulmonary:     Effort: Pulmonary effort is normal. No respiratory distress.     Breath sounds: Normal breath sounds. No wheezing.  Abdominal:     General: Bowel sounds are normal. There is no  distension.     Palpations: Abdomen is soft.     Tenderness: There is no abdominal tenderness.  Musculoskeletal:        General: Normal range of motion.     Cervical back: Normal range of motion and neck supple.  Skin:    General: Skin is warm and dry.  Neurological:     General: No focal deficit present.     Mental Status: She is alert.  Psychiatric:        Mood and Affect: Mood normal.      Data Reviewed: Results for orders placed or performed during the hospital encounter of 05/26/24 (from the past 24 hours)  CBC     Status: Abnormal   Collection Time: 05/28/24  5:06 AM  Result Value Ref Range   WBC 9.0 4.0 - 10.5 K/uL   RBC 3.36 (L) 3.87 - 5.11 MIL/uL   Hemoglobin 10.1 (L) 12.0 - 15.0 g/dL   HCT 69.1 (L) 63.9 - 53.9 %   MCV 91.7 80.0 - 100.0 fL   MCH 30.1 26.0 - 34.0 pg   MCHC 32.8 30.0 - 36.0 g/dL   RDW 87.1 88.4 - 84.4 %   Platelets 107 (L) 150 - 400 K/uL   nRBC 0.0 0.0 - 0.2 %  Basic metabolic panel with GFR     Status: Abnormal   Collection Time: 05/28/24  5:06 AM  Result Value Ref Range   Sodium 136 135 - 145 mmol/L   Potassium 3.8 3.5 - 5.1 mmol/L   Chloride 101 98 - 111 mmol/L   CO2 25 22 - 32 mmol/L   Glucose, Bld 90 70 - 99 mg/dL   BUN 28 (H) 8 - 23 mg/dL   Creatinine, Ser 9.03 0.44 - 1.00 mg/dL   Calcium  9.2 8.9 - 10.3 mg/dL   GFR, Estimated 54 (L) >60 mL/min   Anion gap 10 5 - 15    I have reviewed pertinent nursing notes, vitals, labs, and images as necessary. I have ordered labwork to follow up on as indicated.  I have reviewed the last notes from staff over past 24 hours. I have discussed patient's care plan and test results with nursing staff, CM/SW, and other staff as appropriate.  Old records reviewed in assessment of this patient  Time spent: Greater than 50% of the 55 minute visit was spent in counseling/coordination of care for the patient as laid out in the A&P.   LOS: 2 days   Alm Apo, MD [facility] 05/28/2024, 11:58  AM "

## 2024-05-28 NOTE — Progress Notes (Signed)
 Modified Barium Swallow Study  Patient Details  Name: Tina Hampton MRN: [id number] Date of Birth: [birth year]  Today's Date: 05/28/2024  Modified Barium Swallow completed.  Full report located under Chart Review in the Imaging Section.  History of Present Illness Pt is 89 year old presented to [facility] on  05/26/24 for fall and AMS. Admitted with UTI. CT C/A/P shows a large hiatal hernia but no focal consolidation or pulmonary edema. PMH: HTN, osteoporosis,DDD, depression, hip fx, GERD   Clinical Impression Pt exhibits mild pharyngeal dysphagia characterized by occasional mistiming and decreased BOT retraction. Epiglottic inversion and laryngeal elevation are complete. Timing becomes more disorganized with larger boluses and the bolus pools in the pyriform sinuses before the swallow. This resulted in one instance of sensed aspiration with thin liquids as the bolus spilled over the pyriform sinuses before the laryngeal vestibule closes completely (PAS 7). Trace pharyngeal residue is suspected to be contributed to by reduced BOT retraction in addition to chronic degenerative cervical spine changes (see CT) and a prominent cricopharyngeus. The 13 mm barium tablet was given with purees, resulting in distal esophageal retention. Anticipate episodic aspiration of trace amounts (which is considered a mild impairment) and pt does not exhibit significant factors that may increase risk in its presence. Continue current diet with meds given whole in puree. Encourage cueing to control the rate/volume of sips of liquid and use of frequent oral care. Will f/u for ongoing education with pt and her family.   DIGEST Swallow Severity Rating*  Safety: 1  Efficiency: 1  Overall Pharyngeal Swallow Severity: 1 (mild) 1: mild; 2: moderate; 3: severe; 4: profound  *The Dynamic Imaging Grade of Swallowing Toxicity is standardized for the head and neck cancer population, however, demonstrates promising clinical  applications across populations to standardize the clinical rating of pharyngeal swallow safety and severity.  Factors that may increase risk of adverse event in presence of aspiration Noe & Lianne 2021): Reduced cognitive function;Limited mobility;Frail or deconditioned  Swallow Evaluation Recommendations Recommendations: PO diet PO Diet Recommendation: Regular;Thin liquids (Level 0) Liquid Administration via: Cup;Straw Medication Administration: Whole meds with puree Supervision: Full assist for feeding;Full supervision/cueing for swallowing strategies Swallowing strategies  : Slow rate;Small bites/sips Postural changes: Position pt fully upright for meals;Stay upright 30-60 min after meals Oral care recommendations: Oral care BID (2x/day)    Damien Blumenthal, M.A., CCC-SLP Speech Language Pathology, Acute Rehabilitation Services  Secure Chat preferred [phone]  05/28/2024,11:00 AM

## 2024-05-28 NOTE — Progress Notes (Signed)
 SPIRITUAL CARE AND COUNSELING CONSULT NOTE   VISIT SUMMARY Chaplain responded to spiritual care consult for advance directives. Paperwork already on file. I met with pt's son, Almer, who shared that he had an updated version that he would submit to us . Submission email provided. All questions answered and no further needs at this time.  SPIRITUAL ENCOUNTER                                                                                                                                                                      Type of Visit: Initial Care provided to:: Family Conversation partners present during encounter: Nurse Reason for visit: Advance directives OnCall Visit: No  If immediate needs arise, please contact Jolynn Pack 24 hour on call [phone]   Donnice JINNY Shuck, Chaplain  05/28/2024 4:33 PM

## 2024-05-28 NOTE — Consult Note (Signed)
 "                              Consultation Note Date: 05/28/2024   Patient Name: Tina Hampton  DOB: December 15, 1925  MRN: [id number]  Age / Sex: 89 y.o., female  PCP: [name], MD Referring Physician: Patsy Lenis, MD  Reason for Consultation: Establishing goals of care  HPI/Patient Profile: 89 y.o. female  with past medical history of HTN, HLD, DDD, GAD, depression, insomnia, osteoporosis, GERD, PAD, overactive bladder and CKD stage II admitted on 05/26/2024 with generalized weakness, lethargy, recurrent falls.   Patient presented from ALF.  Workup in the ED included UA which showed bacteriuria, large leukocytes but negative nitrite.  She was also found to have AKI attributed to dehydration.  She was admitted for treatment and monitoring of acute metabolic encephalopathy.  PMT has been consulted to assist with goals of care conversation.  Clinical Assessment and Goals of Care:  I have reviewed medical records including EPIC notes, labs and imaging, discussed with RN and MD, assessed the patient and then met at the bedside with patient's son Deward and then patient's son Worth to discuss diagnosis prognosis, GOC, EOL wishes, disposition and options.  I introduced Palliative Medicine as specialized medical care for people living with serious illness. It focuses on providing relief from the symptoms and stress of a serious illness. The goal is to improve quality of life for both the patient and the family.  We discussed a brief life review of the patient and then focused on their current illness.  The natural disease trajectory and expectations at EOL were discussed.  I attempted to elicit values and goals of care important to the patient.    Medical History Review and Understanding:  We discussed patient's acute illness in the context of their chronic comorbidities.  Provided education on failure to thrive syndrome and concern for further decline given patient's frailty.    Social  History: Patient is widowed for 10 years.  She lives at ALF/friend's home.  She has 2 sons.  She is originally from [place] .  Functional and Nutritional State: Deward reports that patient has had poor intake for a long time now.  She ambulates with a walker.  Palliative Symptoms: Difficulty with bowel movements, unable to clarify  Advance Directives: A detailed discussion regarding advanced directives was had. Documentation on file confirms patient's HCPOA is son D. [facility]) followed by Deward. MOST forms on file reviewed, both indicating preference for full code/full scope treatment in 2013 and 2019. She was agreeable to a short-term feeding tube most recently.  Code Status: Concepts specific to code status, artifical feeding and hydration, and rehospitalization were considered and discussed.   Discussion: During my first meeting with patient's son Deward, he shares his hesitancy to make any complex medical decisions on his own.  He shares that the medical team has been discussing CODE STATUS with him, which he understands, though he is not sure how his brother will feel.  They confirm that the plan is going to [place] , but they are unsure of whether to make the move straight from the hospital or pursue rehab in [place] first.  He is also unsure of what goals of care discussions have been had recently.  I provided him with hard choices for loving people booklet.  Patient was able to tell me that her quality of life is not so  good but having a hard time participating today.  During my meeting with patient's son Almer, he tells me he understands the role of palliative medicine but he does not feel that patient is quite there yet.  He wishes to see how she will recover from this UTI and whether she will be able to get any healthier first.  He shares that concern for her UTI was brought up by family to the facility, though this was thought to be ruled out.  He  wonders if she had a small infection going on for some time now.  I provided education on the risk for recurrent infections and other complications of prolonged mobility limitations and deconditioning.  Discussed that this would not be treatable or reversible.  Emphasized the importance of planning for best case and worst-case scenario.  He worries that she may lose any progress with her health if they start to have discussions now and declines outpatient palliative care.  He wants to pursue SNF placement for rehab, though unsure of [place]  NCM arrived at that time to continue discussions around disposition.   The difference between aggressive medical intervention and comfort care was considered in light of the patient's goals of care. Hospice and Palliative Care services outpatient were explained and offered.   Discussed the importance of continued conversation with family and the medical providers regarding overall plan of care and treatment options, ensuring decisions are within the context of the patients values and GOCs.   Questions and concerns were addressed.  Hard Choices booklet left for review. The family was encouraged to call with questions or concerns.  PMT will continue to support holistically.   SUMMARY OF RECOMMENDATIONS   - Continue full code/full scope treatment - Patient's HCPOA is hesitant to discuss GOC and prefers to defer to outpatient setting - Goal is SNF/rehab then [place]  for continued care - Outpatient palliative care declined at this time - PMT remains available as needed  Prognosis:  Concerning  Discharge Planning: SNF     Primary Diagnoses: Present on Admission:  Acute metabolic encephalopathy   Physical Exam Vitals and nursing note reviewed.  Constitutional:      General: She is not in acute distress.    Appearance: She is ill-appearing.  HENT:     Head: Normocephalic and atraumatic.  Cardiovascular:      Rate and Rhythm: Normal rate.  Pulmonary:     Effort: Pulmonary effort is normal.  Skin:    General: Skin is warm and dry.  Neurological:     Mental Status: She is alert.     Comments: Intermittently confused, difficulty with memory  Psychiatric:        Attention and Perception: She is inattentive.    Vital Signs: BP 122/74 (BP Location: Right Arm)   Pulse 92   Temp 97.7 F (36.5 C)   Resp 20   SpO2 93%  Pain Scale: 0-10   Pain Score: 0-No pain   SpO2: SpO2: 93 % O2 Device:SpO2: 93 % O2 Flow Rate: .O2 Flow Rate (L/min): 4 L/min   Palliative Assessment/Data:    Callie Facey P Haddon Fyfe, PA-C  Palliative Medicine Team Team phone # [phone]  Thank you for allowing the Palliative Medicine Team to assist in the care of this patient. Please utilize secure chat with additional questions, if there is no response within 30 minutes please call the above phone number.  Palliative Medicine Team providers are available by phone from  7am to 7pm daily and can be reached through the team cell phone.  Should this patient require assistance outside of these hours, please call the patient's attending physician.   I personally spent a total of 55 minutes in the care of the patient today including preparing to see the patient, getting/reviewing separately obtained history, performing a medically appropriate exam/evaluation, counseling and educating, referring and communicating with other health care professionals, documenting clinical information in the EHR, and coordinating care.    "

## 2024-05-28 NOTE — NC FL2 (Signed)
 " [place]  MEDICAID FL2 LEVEL OF CARE FORM     IDENTIFICATION  Patient Name: Tina Hampton Birthdate: 1926-04-28 Sex: female Admission Date (Current Location): 05/26/2024  [facility] and [place] Number:  Producer, Television/film/video and Address:  [facility], [address], [place], [place] [address]      Provider Number: [id number]  Attending Physician Name and Address:  Patsy Lenis, MD  Relative Name and Phone Number:  Gena Laski, son - [phone]    Current Level of Care: Hospital Recommended Level of Care: Skilled Nursing Facility Prior Approval Number:    Date Approved/Denied:   PASRR Number: [id number]  Discharge Plan: SNF    Current Diagnoses: Patient Active Problem List   Diagnosis Date Noted   Acute cystitis with hematuria 05/27/2024   AKI (acute kidney injury) 05/27/2024   Recurrent falls 05/27/2024   Generalized weakness 05/27/2024   Acute metabolic encephalopathy 05/26/2024   Anal irritation 03/30/2021   Vaginal bleeding 09/01/2020   Neuropathy of finger of left hand 01/03/2020   Constipation 05/16/2019   Dizziness 10/05/2018   Blurred vision 10/05/2018   Severe episode of recurrent major depressive disorder, without psychotic features (HCC) 05/03/2017   GAD (generalized anxiety disorder) 05/03/2017   Spinal stenosis of lumbar region with neurogenic claudication 03/01/2017   Major depression, recurrent, chronic 03/01/2017   Primary osteoarthritis involving multiple joints 03/01/2017   OAB (overactive bladder) 03/01/2017   Hearing loss 10/25/2016   Chronic bilateral low back pain without sciatica 08/17/2016   Pain in joint, shoulder region 05/31/2016   Sprain of anterior talofibular ligament 04/27/2016   Pain of left scapula 10/20/2015   Tremor 06/02/2015   Vaginal stenosis 11/18/2014   PAOD (peripheral arterial occlusive disease) 11/18/2014   Age-related osteoporosis without current pathological fracture 11/18/2014    Hyperglycemia 11/18/2014   Left inguinal hernia 04/24/2014   Diverticulosis of colon with hemorrhage 04/24/2014   Aortic ejection murmur 04/24/2014   Dysphagia 01/24/2014   Esophageal stricture 01/24/2014   Hip fracture (HCC) 11/07/2011   GERD (gastroesophageal reflux disease)    Hypertension    Hyperlipidemia LDL goal <100    Hiatal hernia    Scoliosis    DDD (degenerative disc disease)    Senile osteoporosis 12/14/2004   Postmenopausal atrophic vaginitis 12/14/2004   Edema 11/26/2004   Urinary incontinence 11/26/2004    Orientation RESPIRATION BLADDER Height & Weight     Self  Normal External catheter Weight:   Height:     BEHAVIORAL SYMPTOMS/MOOD NEUROLOGICAL BOWEL NUTRITION STATUS      Continent Diet (See Discharge Summary)  AMBULATORY STATUS COMMUNICATION OF NEEDS Skin   Extensive Assist Verbally Skin abrasions                       Personal Care Assistance Level of Assistance  Bathing, Feeding, Dressing Bathing Assistance: Limited assistance Feeding assistance: Limited assistance Dressing Assistance: Maximum assistance     Functional Limitations Info  Sight, Hearing, Speech Sight Info: Impaired Hearing Info: Adequate Speech Info: Adequate    SPECIAL CARE FACTORS FREQUENCY  PT (By licensed PT), OT (By licensed OT)     PT Frequency: 5 x per week OT Frequency: 5 x per week            Contractures Contractures Info: Not present    Additional Factors Info  Code Status, Allergies Code Status Info: Full Code Allergies Info: Brexpiprazole, Lamictal, Penicillin, Abilify  Current Medications (05/28/2024):  This is the current hospital active medication list Current Facility-Administered Medications  Medication Dose Route Frequency Provider Last Rate Last Admin   acetaminophen  (TYLENOL ) tablet 650 mg  650 mg Oral Q6H PRN Amponsah, Prosper M, MD   650 mg at 05/27/24 1210   Or   acetaminophen  (TYLENOL ) suppository 650 mg  650 mg Rectal Q6H  PRN Amponsah, Prosper M, MD       artificial tears ophthalmic solution 2 drop  2 drop Left Eye PRN Opyd, Timothy S, MD   2 drop at 05/27/24 2154   bisacodyl  (DULCOLAX) EC tablet 5 mg  5 mg Oral Daily PRN Amponsah, Prosper M, MD       cefTRIAXone  (ROCEPHIN ) 1 g in sodium chloride  0.9 % 100 mL IVPB  1 g Intravenous Q24H Amponsah, Prosper M, MD 200 mL/hr at 05/27/24 2058 1 g at 05/27/24 2058   enoxaparin  (LOVENOX ) injection 30 mg  30 mg Subcutaneous Q24H Amponsah, Prosper M, MD   30 mg at 05/28/24 0931   ipratropium-albuterol  (DUONEB) 0.5-2.5 (3) MG/3ML nebulizer solution 3 mL  3 mL Nebulization Q4H PRN Darci Pore, MD   3 mL at 05/27/24 1236   ondansetron  (ZOFRAN ) tablet 4 mg  4 mg Oral Q6H PRN Amponsah, Prosper M, MD       Or   ondansetron  (ZOFRAN ) injection 4 mg  4 mg Intravenous Q6H PRN Amponsah, Prosper M, MD       senna-docusate (Senokot-S) tablet 1 tablet  1 tablet Oral QHS PRN Amponsah, Prosper M, MD         Discharge Medications: Please see discharge summary for a list of discharge medications.  Relevant Imaging Results:  Relevant Lab Results:   Additional Information SS# [id number]  Rosaline JONELLE Joe, RN     "

## 2024-05-28 NOTE — Evaluation (Signed)
 Occupational Therapy Evaluation Patient Details Name: Tina Hampton MRN: [id number] DOB: 1925/06/18 Today's Date: 05/28/2024   History of Present Illness   Pt is 89 year old presented to [facility] on  [facility]/26/[facility] for fall and AMS. Pt with UTI. PMH - HTN, osteoporosis,DDD, depression, hip fx     Clinical Impressions Pt presented in bed and was able to complete bed mobility with max assist as appeared to be very anxious with all movements. Pt then engaged in light face washing and then encourage more mobility but reporting they did enough already. She then did attempt to assist with lateral scoot with max assist to [facility] and then wanted to go back into supine. When sitting at EOB she did become nervous and taking short breaths with o2 dropping on RA to 86% and was placed on 2L as even with cues was unable to bring above 90%. Pt then was at 97% on 2L at rest and nursing was made aware. Patient will benefit from continued inpatient follow up therapy, <3 hours/day.     If plan is discharge home, recommend the following:   Two people to help with walking and/or transfers;Two people to help with bathing/dressing/bathroom;Assistance with cooking/housework;Assistance with feeding;Direct supervision/assist for medications management;Direct supervision/assist for financial management;Assist for transportation;Help with stairs or ramp for entrance;Supervision due to cognitive status     Functional Status Assessment   Patient has had a recent decline in their functional status and demonstrates the ability to make significant improvements in function in a reasonable and predictable amount of time.     Equipment Recommendations    (TBD at next venue)     Recommendations for Other Services         Precautions/Restrictions   Precautions Precautions: Fall Recall of Precautions/Restrictions: Impaired Restrictions Weight Bearing Restrictions Per Provider Order: No     Mobility Bed  Mobility Overal bed mobility: Needs Assistance Bed Mobility: Supine to Sit, Sit to Supine     Supine to sit: Mod assist, HOB elevated, Used rails, Max assist Sit to supine: Mod assist, Max assist, HOB elevated, Used rails   General bed mobility comments: pt needs max assist more as pt appeared to be very nervous when coming to EOB    Transfers                   General transfer comment: deffered, pt decline as reported they did enough      Balance Overall balance assessment: Needs assistance Sitting-balance support: Feet supported Sitting balance-Leahy Scale: Fair Sitting balance - Comments: able to complete light ADLS while sitting                                   ADL either performed or assessed with clinical judgement   ADL Overall ADL's : Needs assistance/impaired Eating/Feeding: Set up;Sitting   Grooming: Wash/dry face;Contact guard assist;Sitting Grooming Details (indicate cue type and reason): cues on throughness Upper Body Bathing: Minimal assistance;Sitting   Lower Body Bathing: Maximal assistance;Bed level   Upper Body Dressing : Minimal assistance;Sitting   Lower Body Dressing: Maximal assistance;Bed level                       Vision Patient Visual Report:  (pt noted to be able to read the date on the wall)       Perception         Praxis  Pertinent Vitals/Pain Pain Assessment Pain Assessment: Faces Faces Pain Scale: Hurts a little bit Pain Location: all over Pain Descriptors / Indicators: Grimacing, Moaning Pain Intervention(s): Limited activity within patient's tolerance, Monitored during session, Repositioned (however, also appeared it may be nervous with movement)     Extremity/Trunk Assessment Upper Extremity Assessment Upper Extremity Assessment: Generalized weakness   Lower Extremity Assessment Lower Extremity Assessment: Defer to PT evaluation       Communication  Communication Communication: Impaired Factors Affecting Communication: Hearing impaired   Cognition Arousal: Alert Behavior During Therapy: Anxious Cognition: No family/caregiver present to determine baseline             OT - Cognition Comments: Pt was able to report she was in the hospital but could not explain further.                 Following commands: Impaired Following commands impaired: Follows one step commands inconsistently     Cueing  General Comments   Cueing Techniques: Verbal cues;Tactile cues      Exercises     Shoulder Instructions      Home Living Family/patient expects to be discharged to:: Skilled nursing facility                                 Additional Comments: Pt has been residing at [facility]. Believe she has been in ALF level but not sure. Per chart pt to move to some facility in [place]  soon.      Prior Functioning/Environment Prior Level of Function : Patient poor historian/Family not available                    OT Problem List: Decreased strength;Decreased activity tolerance;Impaired balance (sitting and/or standing);Decreased safety awareness;Decreased cognition;Decreased knowledge of use of DME or AE;Pain   OT Treatment/Interventions: Self-care/ADL training;Therapeutic exercise;Therapeutic activities;Patient/family education;Balance training      OT Goals(Current goals can be found in the care plan section)   Acute Rehab OT Goals Patient Stated Goal: none OT Goal Formulation: Patient unable to participate in goal setting Time For Goal Achievement: 06/11/24 Potential to Achieve Goals: Fair   OT Frequency:  Min 2X/week    Co-evaluation              AM-PAC OT 6 Clicks Daily Activity     Outcome Measure Help from another person eating meals?: None Help from another person taking care of personal grooming?: A Little Help from another person toileting, which includes using toliet,  bedpan, or urinal?: A Lot Help from another person bathing (including washing, rinsing, drying)?: A Lot Help from another person to put on and taking off regular upper body clothing?: A Little Help from another person to put on and taking off regular lower body clothing?: A Lot 6 Click Score: 16   End of Session Nurse Communication: Mobility status  Activity Tolerance: Other (comment);Patient limited by fatigue (anxious) Patient left: in bed;with call bell/phone within reach;with bed alarm set (chair position)  OT Visit Diagnosis: Repeated falls (R29.6);Muscle weakness (generalized) (M62.81);History of falling (Z91.81);Pain Pain - part of body:  (general)                Time: 8891-8871 OT Time Calculation (min): 20 min Charges:  OT General Charges $OT Visit: 1 Visit OT Evaluation $OT Eval Low Complexity: 1 Low  Warrick POUR OTR/L  Acute Rehab Services  [phone] office number  Warrick Berber 05/28/2024, 11:38 AM

## 2024-05-28 NOTE — Hospital Course (Signed)
 Tina Hampton is a 89 y.o. female with medical history significant for HTN, HLD, DDD, GAD, depression, insomnia, osteoporosis, GERD, PAD, overactive bladder and CKD stage II who presented via EMS from [facility] due to generalized weakness, altered mental status and recurrent falls.      Assessment and Plan:  Acute metabolic encephalopathy Patient has been more lethargic and disoriented. Mental status change in the setting of UTI and AKI - Has improved and returned back to normal baseline mentation  Acute cystitis UA abnormal, presented with altered mental status. -Urine culture equivocal; will finish empiric course of antibiotics -Continue on Rocephin  for 1 more day, then likely transition to oral to complete  AKI -Attributed to poor intake and UTI on admission - baseline creatinine ~ 0.9 - patient presents with increase in creat >0.3 mg/dL above baseline or creat increase >1.5x baseline presumed to have occurred within past 7 days PTA - Has improved back to baseline with fluids.  Peaked at 1.7  Physical deconditioning -Weakness due to UTI and AKI on admission -Resides at assisted living.  Family reports trying to transition her to moving to [place]  with potentially private home caretaker -Continue working with PT/OT while hospitalized   Hypertension Hold nifedipine  due to low blood pressures.   Hyperlipidemia Resume pravastatin    GERD Resume PPI

## 2024-05-28 NOTE — Progress Notes (Signed)
 OT Cancellation Note  Patient Details Name: Tina Hampton MRN: [id number] DOB: 1925-08-08   Cancelled Treatment:    Reason Eval/Treat Not Completed: Patient at procedure or test/ unavailable (Will follow up as able.) Magnum Lunde K OTR/L  Acute Rehab Services  [phone] office number   Warrick Berber 05/28/2024, 10:22 AM

## 2024-05-28 NOTE — Plan of Care (Signed)

## 2024-05-29 DIAGNOSIS — G9341 Metabolic encephalopathy: Secondary | ICD-10-CM | POA: Diagnosis not present

## 2024-05-29 DIAGNOSIS — N3001 Acute cystitis with hematuria: Secondary | ICD-10-CM | POA: Diagnosis not present

## 2024-05-29 DIAGNOSIS — N179 Acute kidney failure, unspecified: Secondary | ICD-10-CM | POA: Diagnosis not present

## 2024-05-29 DIAGNOSIS — R5381 Other malaise: Secondary | ICD-10-CM

## 2024-05-29 LAB — MAGNESIUM: Magnesium: 2.1 mg/dL (ref 1.7–2.4)

## 2024-05-29 LAB — CBC WITH DIFFERENTIAL/PLATELET
Abs Immature Granulocytes: 0.03 K/uL (ref 0.00–0.07)
Basophils Absolute: 0 K/uL (ref 0.0–0.1)
Basophils Relative: 0 %
Eosinophils Absolute: 0.1 K/uL (ref 0.0–0.5)
Eosinophils Relative: 1 %
HCT: 28.9 % — ABNORMAL LOW (ref 36.0–46.0)
Hemoglobin: 9.7 g/dL — ABNORMAL LOW (ref 12.0–15.0)
Immature Granulocytes: 1 %
Lymphocytes Relative: 8 %
Lymphs Abs: 0.5 K/uL — ABNORMAL LOW (ref 0.7–4.0)
MCH: 30.4 pg (ref 26.0–34.0)
MCHC: 33.6 g/dL (ref 30.0–36.0)
MCV: 90.6 fL (ref 80.0–100.0)
Monocytes Absolute: 0.8 K/uL (ref 0.1–1.0)
Monocytes Relative: 13 %
Neutro Abs: 5 K/uL (ref 1.7–7.7)
Neutrophils Relative %: 77 %
Platelets: 121 K/uL — ABNORMAL LOW (ref 150–400)
RBC: 3.19 MIL/uL — ABNORMAL LOW (ref 3.87–5.11)
RDW: 12.4 % (ref 11.5–15.5)
WBC: 6.4 K/uL (ref 4.0–10.5)
nRBC: 0 % (ref 0.0–0.2)

## 2024-05-29 LAB — BASIC METABOLIC PANEL WITH GFR
Anion gap: 9 (ref 5–15)
BUN: 26 mg/dL — ABNORMAL HIGH (ref 8–23)
CO2: 24 mmol/L (ref 22–32)
Calcium: 9.1 mg/dL (ref 8.9–10.3)
Chloride: 102 mmol/L (ref 98–111)
Creatinine, Ser: 0.78 mg/dL (ref 0.44–1.00)
GFR, Estimated: >60 mL/min
Glucose, Bld: 112 mg/dL — ABNORMAL HIGH (ref 70–99)
Potassium: 3.6 mmol/L (ref 3.5–5.1)
Sodium: 135 mmol/L (ref 135–145)

## 2024-05-29 MED ORDER — LUBIPROSTONE 8 MCG PO CAPS
8.0000 ug | ORAL_CAPSULE | Freq: Two times a day (BID) | ORAL | Status: DC
  Administered 2024-05-29 – 2024-05-30 (×2): 8 ug via ORAL
  Filled 2024-05-29 (×4): qty 1

## 2024-05-29 MED ORDER — PANTOPRAZOLE SODIUM 40 MG PO TBEC
40.0000 mg | DELAYED_RELEASE_TABLET | Freq: Every day | ORAL | Status: DC
  Administered 2024-05-29 – 2024-05-30 (×2): 40 mg via ORAL
  Filled 2024-05-29 (×2): qty 1

## 2024-05-29 MED ORDER — POLYETHYLENE GLYCOL 3350 17 G PO PACK: 17.0000 g | PACK | Freq: Every day | ORAL | Status: DC

## 2024-05-29 MED ORDER — NIFEDIPINE ER OSMOTIC RELEASE 60 MG PO TB24
60.0000 mg | ORAL_TABLET | Freq: Every day | ORAL | Status: DC
  Administered 2024-05-29 – 2024-05-30 (×2): 60 mg via ORAL
  Filled 2024-05-29 (×2): qty 1

## 2024-05-29 MED ORDER — ASPIRIN 81 MG PO CHEW
81.0000 mg | CHEWABLE_TABLET | Freq: Every day | ORAL | Status: DC
  Administered 2024-05-29 – 2024-05-30 (×2): 81 mg via ORAL
  Filled 2024-05-29 (×2): qty 1

## 2024-05-29 MED ORDER — PRAVASTATIN SODIUM 10 MG PO TABS
10.0000 mg | ORAL_TABLET | Freq: Every day | ORAL | Status: DC
  Administered 2024-05-30: 10 mg via ORAL
  Filled 2024-05-29 (×2): qty 1

## 2024-05-29 MED ORDER — MIRTAZAPINE 15 MG PO TABS
45.0000 mg | ORAL_TABLET | Freq: Every day | ORAL | Status: DC
  Administered 2024-05-29: 45 mg via ORAL
  Filled 2024-05-29: qty 3

## 2024-05-29 MED ORDER — BUPROPION HCL ER (XL) 150 MG PO TB24
150.0000 mg | ORAL_TABLET | Freq: Every day | ORAL | Status: DC
  Administered 2024-05-29 – 2024-05-30 (×2): 150 mg via ORAL
  Filled 2024-05-29 (×2): qty 1

## 2024-05-29 MED ORDER — ESCITALOPRAM OXALATE 10 MG PO TABS
20.0000 mg | ORAL_TABLET | Freq: Every day | ORAL | Status: DC
  Administered 2024-05-29 – 2024-05-30 (×2): 20 mg via ORAL
  Filled 2024-05-29 (×2): qty 2

## 2024-05-29 MED ORDER — HYDROXYZINE HCL 10 MG PO TABS
10.0000 mg | ORAL_TABLET | Freq: Two times a day (BID) | ORAL | Status: DC
  Administered 2024-05-29 – 2024-05-30 (×3): 10 mg via ORAL
  Filled 2024-05-29 (×3): qty 1

## 2024-05-29 MED ORDER — ENSURE PLUS HIGH PROTEIN PO LIQD
237.0000 mL | Freq: Two times a day (BID) | ORAL | Status: DC
  Administered 2024-05-30 (×2): 237 mL via ORAL

## 2024-05-29 MED ORDER — ENOXAPARIN SODIUM 40 MG/0.4ML IJ SOSY
40.0000 mg | PREFILLED_SYRINGE | INTRAMUSCULAR | Status: DC
  Administered 2024-05-30: 40 mg via SUBCUTANEOUS
  Filled 2024-05-29: qty 0.4

## 2024-05-29 NOTE — Care Management Important Message (Signed)
 Important Message  Patient Details  Name: Tina Hampton MRN: [id number] Date of Birth: 08-31-1925   Important Message Given:  Yes - Medicare IM     Claretta Deed 05/29/2024, 2:51 PM

## 2024-05-29 NOTE — Progress Notes (Signed)
 " Progress Note    Tina Hampton   FMW:[id number]  DOB: [birth year]  DOA: 05/26/2024     3 PCP: [name], MD  Initial CC: Weakness, falls, confusion  Hospital Course: Tina Hampton is a 89 y.o. female with medical history significant for HTN, HLD, DDD, GAD, depression, insomnia, osteoporosis, GERD, PAD, overactive bladder and CKD stage II who presented via EMS from [facility] due to generalized weakness, altered mental status and recurrent falls.      Assessment and Plan:  Acute metabolic encephalopathy Patient has been more lethargic and disoriented. Mental status change in the setting of UTI and AKI - Has improved and returned back to normal baseline mentation  Acute cystitis UA abnormal, presented with altered mental status. -Urine culture equivocal; will finish empiric course of antibiotics -Continue on Rocephin  for 1 more day, then likely transition to oral to complete  AKI -Attributed to poor intake and UTI on admission - baseline creatinine ~ 0.9 - patient presents with increase in creat >0.3 mg/dL above baseline or creat increase >1.5x baseline presumed to have occurred within past 7 days PTA - Has improved back to baseline with fluids.  Peaked at 1.7  Physical deconditioning -Weakness due to UTI and AKI on admission -Resides at assisted living.  Family reports trying to transition her to moving to [place]  with potentially private home caretaker -Continue working with PT/OT while hospitalized   Hypertension - Resume nifedipine    Hyperlipidemia Resume pravastatin    GERD Resume PPI  Interval History:  No events overnight.  Sons present this morning.  Plan is for going to [facility] at [place] on Thursday.   Antimicrobials: Cefepime  05/26/2024 x 1 Rocephin  05/27/2024 >> current  Consultants:    Procedures:    DVT prophylaxis:  enoxaparin  (LOVENOX ) injection 40 mg Start: 05/30/24 1000   Code Status:   Code Status: Limited: Do  not attempt resuscitation (DNR) -DNR-LIMITED -Do Not Intubate/DNI   Barriers to discharge: None Therapy evaluation: PT Orders: Active   PT Follow up Rec: Skilled Nursing-Short Term Rehab (<3 Hours/Day)05/27/2024 1400  Disposition Plan: Home with 24/7 care versus SNF Status is: Inpatient  Mobility Assessment (Last 72 Hours)     Mobility Assessment     Row Name 05/29/24 1229 05/28/24 2019 05/28/24 1100 05/28/24 0930 05/27/24 1554   Does the patient have exclusion criteria? No- Perform mobility assessment No- Perform mobility assessment -- No- Perform mobility assessment No- Perform mobility assessment   What is the highest level of mobility based on the mobility assessment? Level 1 (Bedfast) - Unable to balance while sitting on edge of bed Level 1 (Bedfast) - Unable to balance while sitting on edge of bed Level 2 (Chairfast) - Balance while sitting on edge of bed and cannot stand Level 1 (Bedfast) - Unable to balance while sitting on edge of bed Level 1 (Bedfast) - Unable to balance while sitting on edge of bed   Is the above level different from baseline mobility prior to current illness? Yes - Recommend PT order Yes - Recommend PT order -- Yes - Recommend PT order Yes - Recommend PT order    Row Name 05/27/24 1400           What is the highest level of mobility based on the mobility assessment? Level 1 (Bedfast) - Unable to balance while sitting on edge of bed          Diet: Diet Orders (From admission, onward)     Start  Ordered   05/28/24 1122  Diet regular Room service appropriate? Yes; Fluid consistency: Thin  Diet effective now       Comments: Meds whole with puree  Question Answer Comment  Room service appropriate? Yes   Fluid consistency: Thin      05/28/24 1122            Objective: Blood pressure 135/69, pulse 80, temperature 98.1 F (36.7 C), resp. rate 14, height 5' 2 (1.575 m), weight 64 kg, SpO2 92%.  Examination:  Physical Exam Constitutional:       Appearance: Normal appearance.  HENT:     Head: Normocephalic and atraumatic.     Mouth/Throat:     Mouth: Mucous membranes are moist.  Eyes:     Extraocular Movements: Extraocular movements intact.  Cardiovascular:     Rate and Rhythm: Normal rate and regular rhythm.  Pulmonary:     Effort: Pulmonary effort is normal. No respiratory distress.     Breath sounds: Normal breath sounds. No wheezing.  Abdominal:     General: Bowel sounds are normal. There is no distension.     Palpations: Abdomen is soft.     Tenderness: There is no abdominal tenderness.  Musculoskeletal:        General: Normal range of motion.     Cervical back: Normal range of motion and neck supple.  Skin:    General: Skin is warm and dry.  Neurological:     General: No focal deficit present.     Mental Status: She is alert.  Psychiatric:        Mood and Affect: Mood normal.      Data Reviewed: Results for orders placed or performed during the hospital encounter of 05/26/24 (from the past 24 hours)  Basic metabolic panel with GFR     Status: Abnormal   Collection Time: 05/29/24  2:35 AM  Result Value Ref Range   Sodium 135 135 - 145 mmol/L   Potassium 3.6 3.5 - 5.1 mmol/L   Chloride 102 98 - 111 mmol/L   CO2 24 22 - 32 mmol/L   Glucose, Bld 112 (H) 70 - 99 mg/dL   BUN 26 (H) 8 - 23 mg/dL   Creatinine, Ser 9.21 0.44 - 1.00 mg/dL   Calcium  9.1 8.9 - 10.3 mg/dL   GFR, Estimated >39 >39 mL/min   Anion gap 9 5 - 15  CBC with Differential/Platelet     Status: Abnormal   Collection Time: 05/29/24  2:35 AM  Result Value Ref Range   WBC 6.4 4.0 - 10.5 K/uL   RBC 3.19 (L) 3.87 - 5.11 MIL/uL   Hemoglobin 9.7 (L) 12.0 - 15.0 g/dL   HCT 71.0 (L) 63.9 - 53.9 %   MCV 90.6 80.0 - 100.0 fL   MCH 30.4 26.0 - 34.0 pg   MCHC 33.6 30.0 - 36.0 g/dL   RDW 87.5 88.4 - 84.4 %   Platelets 121 (L) 150 - 400 K/uL   nRBC 0.0 0.0 - 0.2 %   Neutrophils Relative % 77 %   Neutro Abs 5.0 1.7 - 7.7 K/uL   Lymphocytes Relative  8 %   Lymphs Abs 0.5 (L) 0.7 - 4.0 K/uL   Monocytes Relative 13 %   Monocytes Absolute 0.8 0.1 - 1.0 K/uL   Eosinophils Relative 1 %   Eosinophils Absolute 0.1 0.0 - 0.5 K/uL   Basophils Relative 0 %   Basophils Absolute 0.0 0.0 - 0.1 K/uL   Immature  Granulocytes 1 %   Abs Immature Granulocytes 0.03 0.00 - 0.07 K/uL  Magnesium      Status: None   Collection Time: 05/29/24  2:35 AM  Result Value Ref Range   Magnesium  2.1 1.7 - 2.4 mg/dL    I have reviewed pertinent nursing notes, vitals, labs, and images as necessary. I have ordered labwork to follow up on as indicated.  I have reviewed the last notes from staff over past 24 hours. I have discussed patient's care plan and test results with nursing staff, CM/SW, and other staff as appropriate.  Old records reviewed in assessment of this patient  Time spent: Greater than 50% of the 55 minute visit was spent in counseling/coordination of care for the patient as laid out in the A&P.   LOS: 3 days   Alm Apo, MD [facility] 05/29/2024, 3:53 PM "

## 2024-05-29 NOTE — Plan of Care (Addendum)
 Pt with very high anxiety throughout shift. Any time staff interacts with pt she is very anxious, seems almost fearful. Per PTA meds pt takes Escitalopram  & Bupropion  daily, as well as Mirtazapine  at bedtime. Pt also takes Lorazepam  QID. Pt back to baseline mental status. Will ask day shift RN to follow up with primary physician care team regarding possibility of restarting any of these medications to reduce pt's high anxiety.   05:45  Bilateral mitts placed on pt r/t continuous removal of supplemental oxygen via nasal cannula (pt oxygen saturations 85-88% RA overnight), removal of purewick, putting hands in stool, attempting to remove PIV. Pt remains very anxious. Accepted x2 cranberry juices overnight, otherwise refused PO intake when offered.   Problem: Education: Goal: Knowledge of General Education information will improve Description: Including pain rating scale, medication(s)/side effects and non-pharmacologic comfort measures Outcome: Progressing   Problem: Health Behavior/Discharge Planning: Goal: Ability to manage health-related needs will improve Outcome: Progressing   Problem: Clinical Measurements: Goal: Ability to maintain clinical measurements within normal limits will improve Outcome: Progressing Goal: Will remain free from infection Outcome: Progressing Goal: Diagnostic test results will improve Outcome: Progressing Goal: Respiratory complications will improve Outcome: Progressing Goal: Cardiovascular complication will be avoided Outcome: Progressing   Problem: Activity: Goal: Risk for activity intolerance will decrease Outcome: Progressing   Problem: Nutrition: Goal: Adequate nutrition will be maintained Outcome: Progressing   Problem: Coping: Goal: Level of anxiety will decrease Outcome: Progressing   Problem: Elimination: Goal: Will not experience complications related to bowel motility Outcome: Progressing Goal: Will not experience complications related  to urinary retention Outcome: Progressing   Problem: Pain Managment: Goal: General experience of comfort will improve and/or be controlled Outcome: Progressing   Problem: Safety: Goal: Ability to remain free from injury will improve Outcome: Progressing   Problem: Skin Integrity: Goal: Risk for impaired skin integrity will decrease Outcome: Progressing

## 2024-05-29 NOTE — Progress Notes (Signed)
 Speech Language Pathology Treatment: Dysphagia  Patient Details Name: Tina Hampton MRN: [id number] DOB: 05-Jan-1926 Today's Date: 05/29/2024 Time: 8446-8381 SLP Time Calculation (min) (ACUTE ONLY): 25 min  Assessment / Plan / Recommendation Clinical Impression  RN and pt's caregiver notified SLP of increased frequency of coughing after drinking throughout the day today. She is coughing immediately after all sips of thin liquids and reports some discomfort when doing so. This does not recur with nectar thick liquids. Mastication of regular solids is functional without noted residue. Although aspiration was limited to one instance of trace volume on MBS previous date, it was sensed so suspect her coughing that is observed clinically is representative of aspiration.   Called her son, Deward, to discuss plan. He reports PO intake has been primarily limited to protein supplement drinks and worries that modifying her diet would even further limit her intake. Could consider offering nectar thick liquids in the short term to ease the discomfort she feels when coughing but pt's son prefers prioritizing intake. He verbalizes understanding of aspiration-related risk in addition to increased factors affecting this risk due to acute illness. Discussed with MD, RN, and caregiver as well. All are in agreement to continue regular diet with thin liquids, prioritizing frequent oral care to minimize potential adverse events in the presence of aspiration. Will f/u at least briefly.   HPI HPI: Pt is 89 year old presented to [facility] on  05/26/24 for fall and AMS. Admitted with UTI. CT C/A/P shows a large hiatal hernia but no focal consolidation or pulmonary edema. PMH: HTN, osteoporosis,DDD, depression, hip fx, GERD      SLP Plan  Continue with current plan of care        Swallow Evaluation Recommendations   Recommendations: PO diet PO Diet Recommendation: Regular;Thin liquids (Level 0) Liquid Administration via:  Cup;Straw Medication Administration: Whole meds with puree Supervision: Full assist for feeding;Full supervision/cueing for swallowing strategies Postural changes: Position pt fully upright for meals;Stay upright 30-60 min after meals Oral care recommendations: Oral care BID (2x/day)     Recommendations                     Oral care BID   Frequent or constant Supervision/Assistance Dysphagia, pharyngeal phase (R13.13)     Continue with current plan of care     Damien Blumenthal, M.A., CCC-SLP Speech Language Pathology, Acute Rehabilitation Services  Secure Chat preferred [phone]   05/29/2024, 4:36 PM

## 2024-05-29 NOTE — TOC Progression Note (Signed)
 Transition of Care [facility]) - Progression Note    Patient Details  Name: Tina Hampton MRN: [id number] Date of Birth: [birth year]  Transition of Care [facility]) CM/SW Contact  Rosaline JONELLE Joe, RN Phone Number: 05/29/2024, 10:00 AM  Clinical Narrative:    CM met with the patient's son, Tina Hampton, and son is agreeable to have patient likely admit to SNF placement at [facility].  I called and spoke with Jeoffrey, CM with [facility] and I asked that she call and speak with the son on the phone regarding details.  The son will coordinate with CM regarding placement.  The patient will need to remain inpatient until tomorrow to have 3 night stay in the hospital for Medicare to cover SNF placement at the facility.  MD was updated.                     Expected Discharge Plan and Services                                               Social Drivers of Health (SDOH) Interventions SDOH Screenings   Food Insecurity: No Food Insecurity (05/27/2024)  Housing: Low Risk (05/27/2024)  Transportation Needs: No Transportation Needs (05/27/2024)  Utilities: Not At Risk (08/11/2023)  Alcohol  Screen: Low Risk (08/11/2023)  Depression (PHQ2-9): High Risk (01/29/2024)  Financial Resource Strain: Low Risk (08/11/2023)  Physical Activity: Insufficiently Active (08/11/2023)  Social Connections: Socially Isolated (08/11/2023)  Stress: Stress Concern Present (08/11/2023)  Tobacco Use: Low Risk (05/27/2024)    Readmission Risk Interventions    05/28/2024    9:32 AM  Readmission Risk Prevention Plan  Post Dischage Appt Complete  Medication Screening Complete  Transportation Screening Complete

## 2024-05-29 NOTE — Plan of Care (Signed)
  Problem: Education: Goal: Knowledge of General Education information will improve Description: Including pain rating scale, medication(s)/side effects and non-pharmacologic comfort measures Outcome: Progressing   Problem: Clinical Measurements: Goal: Ability to maintain clinical measurements within normal limits will improve Outcome: Progressing   Problem: Nutrition: Goal: Adequate nutrition will be maintained Outcome: Progressing   Problem: Elimination: Goal: Will not experience complications related to bowel motility Outcome: Progressing   Problem: Safety: Goal: Ability to remain free from injury will improve Outcome: Progressing   Problem: Skin Integrity: Goal: Risk for impaired skin integrity will decrease Outcome: Progressing

## 2024-05-30 DIAGNOSIS — N179 Acute kidney failure, unspecified: Secondary | ICD-10-CM | POA: Diagnosis not present

## 2024-05-30 DIAGNOSIS — N3001 Acute cystitis with hematuria: Secondary | ICD-10-CM | POA: Diagnosis not present

## 2024-05-30 DIAGNOSIS — G9341 Metabolic encephalopathy: Secondary | ICD-10-CM | POA: Diagnosis not present

## 2024-05-30 MED ORDER — CEFADROXIL 500 MG PO CAPS: 500.0000 mg | ORAL_CAPSULE | Freq: Two times a day (BID) | ORAL | Status: AC

## 2024-05-30 NOTE — Plan of Care (Signed)
  Problem: Safety: Goal: Ability to remain free from injury will improve Outcome: Progressing   Problem: Health Behavior/Discharge Planning: Goal: Ability to manage health-related needs will improve Outcome: Not Progressing   Problem: Coping: Goal: Level of anxiety will decrease Outcome: Not Progressing

## 2024-05-30 NOTE — TOC Transition Note (Signed)
 Transition of Care [facility]) - Discharge Note   Patient Details  Name: Tina Hampton MRN: [id number] Date of Birth: 1925/06/02  Transition of Care [facility]) CM/SW Contact:  Sherline Clack, LCSWA Phone Number: 05/30/2024, 1:15 PM   Clinical Narrative:     Patient will DC to: [facility] Anticipated DC date: 05/30/24  Family notified: Paul/son Transport by: [facility]   Per MD patient ready for DC to [facility]. RN to call report prior to discharge [phone], [facility] room 40A ). RN, patient, patient's family, and facility notified of DC. Discharge Summary and FL2 sent to facility. DC packet on chart. Ambulance transport requested for patient.   CSW will sign off for now as social work intervention is no longer needed. Please consult us  again if new needs arise.    Final next level of care: Skilled Nursing Facility Barriers to Discharge: Barriers Resolved   Patient Goals and CMS Choice     Choice offered to / list presented to : Patient, Adult Children      Discharge Placement              Patient chooses bed at: [facility] Patient to be transferred to facility by: [facility] Name of family member notified: Paul/son Patient and family notified of of transfer: 05/30/24  Discharge Plan and Services Additional resources added to the After Visit Summary for                                       Social Drivers of Health (SDOH) Interventions SDOH Screenings   Food Insecurity: No Food Insecurity (05/27/2024)  Housing: Low Risk (05/27/2024)  Transportation Needs: No Transportation Needs (05/27/2024)  Utilities: Not At Risk (08/11/2023)  Alcohol  Screen: Low Risk (08/11/2023)  Depression (PHQ2-9): High Risk (01/29/2024)  Financial Resource Strain: Low Risk (08/11/2023)  Physical Activity: Insufficiently Active (08/11/2023)  Social Connections: Socially Isolated (08/11/2023)  Stress: Stress Concern Present (08/11/2023)  Tobacco Use: Low Risk (05/27/2024)      Readmission Risk Interventions    05/28/2024    9:32 AM  Readmission Risk Prevention Plan  Post Dischage Appt Complete  Medication Screening Complete  Transportation Screening Complete

## 2024-05-30 NOTE — Progress Notes (Signed)
 Physical Therapy Treatment Patient Details Name: Tina Hampton MRN: [id number] DOB: May 26, 1925 Today's Date: 05/30/2024   History of Present Illness Pt is 89 year old presented to [facility] on  05/26/24 for fall and AMS. Pt with UTI. PMH - HTN, osteoporosis,DDD, depression, hip fx    PT Comments  Pt received in supine and agreeable to session. Pt demonstrates improved mobility and activity tolerance this session. Pt able to perform bed mobility and transfers with up to mod A. Pt able to pivot to recliner with RW support and min A. Pt demonstrates quick fatigue and requires seated rest. Pt's son reports limited mobility at baseline, but pt able to ambulate to the bathroom with a rollator. Pt continues to benefit from PT services to progress toward functional mobility goals.     If plan is discharge home, recommend the following: Two people to help with walking and/or transfers;A lot of help with bathing/dressing/bathroom;Assistance with cooking/housework   Can travel by private vehicle     No  Equipment Recommendations  Wheelchair (measurements PT);Wheelchair cushion (measurements PT)    Recommendations for Other Services       Precautions / Restrictions Precautions Precautions: Fall Recall of Precautions/Restrictions: Impaired Restrictions Weight Bearing Restrictions Per Provider Order: No     Mobility  Bed Mobility   Bed Mobility: Supine to Sit     Supine to sit: Mod assist, HOB elevated, Used rails     General bed mobility comments: cues for sequencing and assist for trunk elevation and reaching RUE across body to rail    Transfers Overall transfer level: Needs assistance Equipment used: Rolling walker (2 wheels) Transfers: Sit to/from Stand, Bed to chair/wheelchair/BSC Sit to Stand: Mod assist   Step pivot transfers: Min assist       General transfer comment: STS from EOB with cues for hand placement and mod A to rise. Assist for balance and RW management to pivot  to recliner. Pt reports fatigue at end of transfer requiring seated rest    Ambulation/Gait               General Gait Details: unable due to fatigue   Stairs             Wheelchair Mobility     Tilt Bed    Modified Rankin (Stroke Patients Only)       Balance Overall balance assessment: Needs assistance Sitting-balance support: Feet supported Sitting balance-Leahy Scale: Fair Sitting balance - Comments: Min A progressing to CGA at EOB. Intermittent posterior lean, but pt able to correct with cues Postural control: Posterior lean   Standing balance-Leahy Scale: Poor Standing balance comment: reliant on RW support                            Communication Communication Communication: Impaired Factors Affecting Communication: Hearing impaired  Cognition Arousal: Alert Behavior During Therapy: Anxious, WFL for tasks assessed/performed   PT - Cognitive impairments: No family/caregiver present to determine baseline, Orientation, Memory, Attention, Initiation, Problem solving                       PT - Cognition Comments: slightly anxious, but able to be redirected. some short term memory deficits noted with pt repeating herself during session Following commands: Impaired Following commands impaired: Follows one step commands with increased time    Cueing Cueing Techniques: Verbal cues, Tactile cues  Exercises      General Comments General comments (  skin integrity, edema, etc.): Pt able to cough up clear phlegm, RN present and notified      Pertinent Vitals/Pain Pain Assessment Pain Assessment: Faces Faces Pain Scale: Hurts a little bit Pain Location: generalized with movement Pain Descriptors / Indicators: Discomfort Pain Intervention(s): Monitored during session     PT Goals (current goals can now be found in the care plan section) Acute Rehab PT Goals Patient Stated Goal: Pt unable to state PT Goal Formulation: Patient unable  to participate in goal setting Time For Goal Achievement: 06/10/24 Progress towards PT goals: Progressing toward goals    Frequency    Min 1X/week       AM-PAC PT 6 Clicks Mobility   Outcome Measure  Help needed turning from your back to your side while in a flat bed without using bedrails?: A Little Help needed moving from lying on your back to sitting on the side of a flat bed without using bedrails?: A Lot Help needed moving to and from a bed to a chair (including a wheelchair)?: A Little Help needed standing up from a chair using your arms (e.g., wheelchair or bedside chair)?: A Lot Help needed to walk in hospital room?: Total Help needed climbing 3-5 steps with a railing? : Total 6 Click Score: 12    End of Session Equipment Utilized During Treatment: Gait belt Activity Tolerance: Patient limited by fatigue Patient left: with call bell/phone within reach;in chair;with family/visitor present Nurse Communication: Mobility status PT Visit Diagnosis: Other abnormalities of gait and mobility (R26.89);Muscle weakness (generalized) (M62.81);History of falling (Z91.81)     Time: 8940-8871 PT Time Calculation (min) (ACUTE ONLY): 29 min  Charges:    $Therapeutic Activity: 23-37 mins PT General Charges $$ ACUTE PT VISIT: 1 Visit                    Darryle George, PTA Acute Rehabilitation Services Secure Chat Preferred  [phone]    Darryle George 05/30/2024, 11:51 AM

## 2024-05-30 NOTE — Progress Notes (Signed)
 Report called to [place].

## 2024-05-30 NOTE — Discharge Summary (Signed)
 " Physician Discharge Summary   Tzipora Mcinroy FMW:[id number] DOB: [birth year] DOA: 05/26/2024  PCP: [name], MD  Admit date: 05/26/2024 Discharge date:   Admitted From: Home Disposition:  [facility] Discharging physician: Alm Apo, MD Barriers to discharge: none  Discharge Condition: stable CODE STATUS: DNR Diet recommendation:  Diet Orders (From admission, onward)     Start     Ordered   05/30/24 0000  Diet general        05/30/24 1130   05/28/24 1122  Diet regular Room service appropriate? Yes; Fluid consistency: Thin  Diet effective now       Comments: Meds whole with puree  Question Answer Comment  Room service appropriate? Yes   Fluid consistency: Thin      05/28/24 1122            Hospital Course: Jaley Yan is a 89 y.o. female with medical history significant for HTN, HLD, DDD, GAD, depression, insomnia, osteoporosis, GERD, PAD, overactive bladder and CKD stage II who presented via EMS from [facility] due to generalized weakness, altered mental status and recurrent falls.      Assessment and Plan:  Acute metabolic encephalopathy - resolved  Patient has been more lethargic and disoriented. Mental status change in the setting of UTI and AKI - Has improved and returned back to normal baseline mentation  Acute cystitis UA abnormal, presented with altered mental status. -Urine culture equivocal; will finish empiric course of antibiotics - s/p 4 doses IV abx (1 day cefepime  and 3 doses rocephin ). Finish out 2 more days of cefadroxil  at discharge to complete course  AKI - resolved  -Attributed to poor intake and UTI on admission - baseline creatinine ~ 0.9 - patient presents with increase in creat >0.3 mg/dL above baseline or creat increase >1.5x baseline presumed to have occurred within past 7 days PTA - Has improved back to baseline with fluids.  Peaked at 1.7  Physical deconditioning -Weakness due to UTI and AKI on  admission -Resides at assisted living.  Family reports trying to transition her to moving to [place]  with potentially private home caretaker -Continue working with PT/OT while hospitalized - planning on SNF at discharge prior to re-location    Hypertension - Resume nifedipine    Hyperlipidemia Resume pravastatin    GERD Resume PPI    Principal Diagnosis: Acute metabolic encephalopathy  Discharge Diagnoses: Active Hospital Problems   Diagnosis Date Noted   Acute cystitis with hematuria 05/27/2024    Priority: 2.   Physical deconditioning 05/28/2024   Recurrent falls 05/27/2024   Generalized weakness 05/27/2024    Resolved Hospital Problems   Diagnosis Date Noted Date Resolved   Acute metabolic encephalopathy 05/26/2024 05/29/2024    Priority: 1.   AKI (acute kidney injury) 05/27/2024 05/29/2024    Priority: 3.     Discharge Instructions     Diet general   Complete by: As directed    Increase activity slowly   Complete by: As directed       Allergies as of 05/30/2024       Reactions   Brexpiprazole Swelling   Lamictal [lamotrigine] Swelling   Lip swelling   Penicillins Other (See Comments)   CHILDHOOD REACTION   Abilify [aripiprazole] Rash        Medication List     STOP taking these medications    LORazepam  0.5 MG tablet Commonly known as: ATIVAN        TAKE these medications    acetaminophen  325  MG tablet Commonly known as: TYLENOL  Take 650 mg by mouth every 4 (four) hours as needed for fever.   antiseptic oral rinse Liqd 10 mLs by Mouth Rinse route as needed for dry mouth. Resident may self administer and keep in room   aspirin  81 MG chewable tablet Chew 81 mg by mouth daily.   buPROPion  150 MG 24 hr tablet Commonly known as: WELLBUTRIN  XL Take 150 mg by mouth daily.   Calcium  Carb-Cholecalciferol  600-800 MG-UNIT Tabs Take 1 tablet by mouth 2 (two) times daily.   cefadroxil  500 MG capsule Commonly known as: DURICEF Take 1 capsule  (500 mg total) by mouth 2 (two) times daily for 2 days.   cholecalciferol  1000 units tablet Commonly known as: VITAMIN D  Take 1,000 Units by mouth daily.   docusate sodium  100 MG capsule Commonly known as: Colace Take 1 capsule (100 mg total) by mouth every other day.   escitalopram  20 MG tablet Commonly known as: LEXAPRO  Take 1 tablet (20 mg total) by mouth daily.   GenTeal Severe 0.3 % Gel ophthalmic ointment Generic drug: hypromellose Place 1 Application into both eyes as needed for dry eyes.   Glucosamine HCl 500 MG Tabs Take 4 capsules by mouth 2 (two) times daily.   hydrocortisone  1 % ointment Apply 1 Application topically 2 (two) times daily. Apply to perineum   hydrocortisone  2.5 % cream Apply 1 Application topically See admin instructions. Apply to anal topically as needed for irriation as needed twice daily   hydrOXYzine  10 MG tablet Commonly known as: ATARAX  Take 1 tablet (10 mg total) by mouth 2 (two) times daily.   IVIZIA DRY EYES OP Place 2 drops into both eyes 4 (four) times daily.   lubiprostone  8 MCG capsule Commonly known as: AMITIZA  Take 8 mcg by mouth 2 (two) times daily with a meal.   Menthol  (Topical Analgesic) 4 % Gel Apply 1 application  topically as needed. Resident may self administer and keep in room   mirtazapine  45 MG tablet Commonly known as: REMERON  Take 1 tablet (45 mg total) by mouth at bedtime.   NAC 500 MG Caps Generic drug: Acetylcysteine Take 600 mg by mouth daily.   NIFEdipine  60 MG 24 hr tablet Commonly known as: ADALAT  CC Take 60 mg by mouth daily.   pantoprazole  40 MG tablet Commonly known as: PROTONIX  One daily to reduce stomach acid What changed:  how much to take how to take this when to take this   Pepto Bismol 525 MG/30ML suspension Generic drug: bismuth subsalicylate Take 30 mLs by mouth daily as needed for diarrhea or loose stools.   polyethylene glycol 17 g packet Commonly known as: MIRALAX  /  GLYCOLAX  Take 17 g by mouth daily. Mix 17g  with 8 oz. of fluid daily for constipation   pravastatin  10 MG tablet Commonly known as: PRAVACHOL  Take 10 mg by mouth daily.   PRESERVISION AREDS 2+MULTI VIT PO Take 1 tablet by mouth 2 (two) times daily.        Follow-up Information     [facility] Follow up.   Specialties: Skilled Nursing Facility, Assisted Living Facility Contact information: [address] [place]  [address] [phone]               Allergies[1]  Consultations:   Procedures:   Discharge Exam: BP 123/66 (BP Location: Left Arm)   Pulse 73   Temp 98.4 F (36.9 C) (Oral)   Resp 17   Ht 5'  2 (1.575 m)   Wt 64 kg Comment: last wgt on 02/15/2024  SpO2 93%   BMI 25.81 kg/m  Physical Exam Constitutional:      Appearance: Normal appearance.  HENT:     Head: Normocephalic and atraumatic.     Mouth/Throat:     Mouth: Mucous membranes are moist.  Eyes:     Extraocular Movements: Extraocular movements intact.  Cardiovascular:     Rate and Rhythm: Normal rate and regular rhythm.  Pulmonary:     Effort: Pulmonary effort is normal. No respiratory distress.     Breath sounds: Normal breath sounds. No wheezing.  Abdominal:     General: Bowel sounds are normal. There is no distension.     Palpations: Abdomen is soft.     Tenderness: There is no abdominal tenderness.  Musculoskeletal:        General: Normal range of motion.     Cervical back: Normal range of motion and neck supple.  Skin:    General: Skin is warm and dry.  Neurological:     General: No focal deficit present.     Mental Status: She is alert.  Psychiatric:        Mood and Affect: Mood normal.      The results of significant diagnostics from this hospitalization (including imaging, microbiology, ancillary and laboratory) are listed below for reference.   Microbiology: Recent Results (from the past 240 hours)  Blood culture (routine x 2)      Status: Abnormal   Collection Time: 05/26/24  4:55 PM   Specimen: BLOOD LEFT ARM  Result Value Ref Range Status   Specimen Description BLOOD LEFT ARM  Final   Special Requests   Final    BOTTLES DRAWN AEROBIC AND ANAEROBIC Blood Culture results may not be optimal due to an inadequate volume of blood received in culture bottles   Culture  Setup Time   Final    GRAM POSITIVE COCCI IN CLUSTERS IN BOTH AEROBIC AND ANAEROBIC BOTTLES CRITICAL RESULT CALLED TO, READ BACK BY AND VERIFIED WITH: PHARMD J LEDFORD 05/28/2024 @ 0226 BY AB    Culture (A)  Final    STAPHYLOCOCCUS CAPITIS THE SIGNIFICANCE OF ISOLATING THIS ORGANISM FROM A SINGLE SET OF BLOOD CULTURES WHEN MULTIPLE SETS ARE DRAWN IS UNCERTAIN. PLEASE NOTIFY THE MICROBIOLOGY DEPARTMENT WITHIN ONE WEEK IF SPECIATION AND SENSITIVITIES ARE REQUIRED. Performed at [facility], [address], [place], [place] [address]    Report Status 05/28/2024 FINAL  Final  Blood Culture ID Panel (Reflexed)     Status: Abnormal   Collection Time: 05/26/24  4:55 PM  Result Value Ref Range Status   Enterococcus faecalis NOT DETECTED NOT DETECTED Final   Enterococcus Faecium NOT DETECTED NOT DETECTED Final   Listeria monocytogenes NOT DETECTED NOT DETECTED Final   Staphylococcus species DETECTED (A) NOT DETECTED Final    Comment: CRITICAL RESULT CALLED TO, READ BACK BY AND VERIFIED WITH: PHARMD J LEDFORD 05/28/2024 @ 0226 BY AB    Staphylococcus aureus (BCID) NOT DETECTED NOT DETECTED Final   Staphylococcus epidermidis NOT DETECTED NOT DETECTED Final   Staphylococcus lugdunensis NOT DETECTED NOT DETECTED Final   Streptococcus species NOT DETECTED NOT DETECTED Final   Streptococcus agalactiae NOT DETECTED NOT DETECTED Final   Streptococcus pneumoniae NOT DETECTED NOT DETECTED Final   Streptococcus pyogenes NOT DETECTED NOT DETECTED Final   A.calcoaceticus-baumannii NOT DETECTED NOT DETECTED Final   Bacteroides fragilis NOT DETECTED NOT DETECTED  Final   Enterobacterales  NOT DETECTED NOT DETECTED Final   Enterobacter cloacae complex NOT DETECTED NOT DETECTED Final   Escherichia coli NOT DETECTED NOT DETECTED Final   Klebsiella aerogenes NOT DETECTED NOT DETECTED Final   Klebsiella oxytoca NOT DETECTED NOT DETECTED Final   Klebsiella pneumoniae NOT DETECTED NOT DETECTED Final   Proteus species NOT DETECTED NOT DETECTED Final   Salmonella species NOT DETECTED NOT DETECTED Final   Serratia marcescens NOT DETECTED NOT DETECTED Final   Haemophilus influenzae NOT DETECTED NOT DETECTED Final   Neisseria meningitidis NOT DETECTED NOT DETECTED Final   Pseudomonas aeruginosa NOT DETECTED NOT DETECTED Final   Stenotrophomonas maltophilia NOT DETECTED NOT DETECTED Final   Candida albicans NOT DETECTED NOT DETECTED Final   Candida auris NOT DETECTED NOT DETECTED Final   Candida glabrata NOT DETECTED NOT DETECTED Final   Candida krusei NOT DETECTED NOT DETECTED Final   Candida parapsilosis NOT DETECTED NOT DETECTED Final   Candida tropicalis NOT DETECTED NOT DETECTED Final   Cryptococcus neoformans/gattii NOT DETECTED NOT DETECTED Final    Comment: Performed at [facility], [address] [place], [place] [address]  Blood culture (routine x 2)     Status: None (Preliminary result)   Collection Time: 05/26/24  5:00 PM   Specimen: BLOOD RIGHT ARM  Result Value Ref Range Status   Specimen Description BLOOD RIGHT ARM  Final   Special Requests   Final    AEROBIC BOTTLE ONLY Blood Culture results may not be optimal due to an inadequate volume of blood received in culture bottles   Culture   Final    NO GROWTH 4 DAYS Performed at [facility], [address] [place], [place] [address]    Report Status PENDING  Incomplete  Resp panel by RT-PCR (RSV, Flu A&B, Covid) Anterior Nasal Swab     Status: None   Collection Time: 05/26/24  5:52 PM   Specimen: Anterior Nasal Swab  Result Value Ref Range Status   SARS Coronavirus 2 by RT  PCR NEGATIVE NEGATIVE Final   Influenza A by PCR NEGATIVE NEGATIVE Final   Influenza B by PCR NEGATIVE NEGATIVE Final    Comment: (NOTE) The Xpert Xpress SARS-CoV-2/FLU/RSV plus assay is intended as an aid in the diagnosis of influenza from Nasopharyngeal swab specimens and should not be used as a sole basis for treatment. Nasal washings and aspirates are unacceptable for Xpert Xpress SARS-CoV-2/FLU/RSV testing.  Fact Sheet for Patients: bloggercourse.com  Fact Sheet for Healthcare Providers: seriousbroker.it  This test is not yet approved or cleared by the [place]  FDA and has been authorized for detection and/or diagnosis of SARS-CoV-2 by FDA under an Emergency Use Authorization (EUA). This EUA will remain in effect (meaning this test can be used) for the duration of the COVID-19 declaration under Section 564(b)(1) of the Act, 21 U.S.C. section 360bbb-3(b)(1), unless the authorization is terminated or revoked.     Resp Syncytial Virus by PCR NEGATIVE NEGATIVE Final    Comment: (NOTE) Fact Sheet for Patients: bloggercourse.com  Fact Sheet for Healthcare Providers: seriousbroker.it  This test is not yet approved or cleared by the [place]  FDA and has been authorized for detection and/or diagnosis of SARS-CoV-2 by FDA under an Emergency Use Authorization (EUA). This EUA will remain in effect (meaning this test can be used) for the duration of the COVID-19 declaration under Section 564(b)(1) of the Act, 21 U.S.C. section 360bbb-3(b)(1), unless the authorization is terminated or revoked.  Performed at [facility], [address], [place], [place] [address]   Urine Culture (for pregnant, neutropenic or urologic patients or patients with an indwelling urinary catheter)     Status: Abnormal   Collection Time: 05/27/24  1:02 AM   Specimen: Urine, Clean Catch   Result Value Ref Range Status   Specimen Description URINE, CLEAN CATCH  Final   Special Requests   Final    NONE Performed at [facility], [address], [place], [place] [address]    Culture MULTIPLE SPECIES PRESENT, SUGGEST RECOLLECTION (A)  Final   Report Status 05/28/2024 FINAL  Final     Labs: BNP (last 3 results) No results for input(s): BNP in the last 8760 hours. Basic Metabolic Panel: Recent Labs  Lab 05/26/24 1649 05/26/24 1800 05/27/24 0237 05/28/24 0506 05/29/24 0235  NA 134* 135 134* 136 135  K 4.4 4.3 4.3 3.8 3.6  CL 98 98 101 101 102  CO2 25  --  23 25 24   GLUCOSE 120* 120* 110* 90 112*  BUN 36* 38* 33* 28* 26*  CREATININE 1.59* 1.70* 1.19* 0.96 0.78  CALCIUM  10.5*  --  9.5 9.2 9.1  MG  --   --   --   --  2.1   Liver Function Tests: Recent Labs  Lab 05/26/24 1649  AST 303*  ALT 114*  ALKPHOS 96  BILITOT 0.5  PROT 7.3  ALBUMIN 3.7   No results for input(s): LIPASE, AMYLASE in the last 168 hours. No results for input(s): AMMONIA in the last 168 hours. CBC: Recent Labs  Lab 05/26/24 1649 05/26/24 1800 05/27/24 0237 05/28/24 0506 05/29/24 0235  WBC 16.4*  --  14.0* 9.0 6.4  NEUTROABS  --   --   --   --  5.0  HGB 12.0 12.2 10.5* 10.1* 9.7*  HCT 37.7 36.0 32.8* 30.8* 28.9*  MCV 94.7  --  93.4 91.7 90.6  PLT 145*  --  126* 107* 121*   Cardiac Enzymes: No results for input(s): CKTOTAL, CKMB, CKMBINDEX, TROPONINI in the last 168 hours. BNP: Invalid input(s): POCBNP CBG: No results for input(s): GLUCAP in the last 168 hours. D-Dimer No results for input(s): DDIMER in the last 72 hours. Hgb A1c No results for input(s): HGBA1C in the last 72 hours. Lipid Profile No results for input(s): CHOL, HDL, LDLCALC, TRIG, CHOLHDL, LDLDIRECT in the last 72 hours. Thyroid  function studies No results for input(s): TSH, T4TOTAL, T3FREE, THYROIDAB in the last 72 hours.  Invalid input(s):  FREET3 Anemia work up No results for input(s): VITAMINB12, FOLATE, FERRITIN, TIBC, IRON, RETICCTPCT in the last 72 hours. Urinalysis    Component Value Date/Time   COLORURINE AMBER (A) 05/26/2024 2100   APPEARANCEUR TURBID (A) 05/26/2024 2100   LABSPEC 1.018 05/26/2024 2100   PHURINE 5.0 05/26/2024 2100   GLUCOSEU NEGATIVE 05/26/2024 2100   HGBUR SMALL (A) 05/26/2024 2100   BILIRUBINUR NEGATIVE 05/26/2024 2100   BILIRUBINUR Neg 04/20/2014 1526   KETONESUR NEGATIVE 05/26/2024 2100   PROTEINUR 100 (A) 05/26/2024 2100   UROBILINOGEN 0.2 04/20/2014 1526   UROBILINOGEN 0.2 11/07/2011 1514   NITRITE NEGATIVE 05/26/2024 2100   LEUKOCYTESUR LARGE (A) 05/26/2024 2100   Sepsis Labs Recent Labs  Lab 05/26/24 1649 05/27/24 0237 05/28/24 0506 05/29/24 0235  WBC 16.4* 14.0* 9.0 6.4   Microbiology Recent Results (from the past 240 hours)  Blood culture (routine x 2)     Status: Abnormal   Collection Time: 05/26/24  4:55 PM  Specimen: BLOOD LEFT ARM  Result Value Ref Range Status   Specimen Description BLOOD LEFT ARM  Final   Special Requests   Final    BOTTLES DRAWN AEROBIC AND ANAEROBIC Blood Culture results may not be optimal due to an inadequate volume of blood received in culture bottles   Culture  Setup Time   Final    GRAM POSITIVE COCCI IN CLUSTERS IN BOTH AEROBIC AND ANAEROBIC BOTTLES CRITICAL RESULT CALLED TO, READ BACK BY AND VERIFIED WITH: PHARMD J LEDFORD 05/28/2024 @ 0226 BY AB    Culture (A)  Final    STAPHYLOCOCCUS CAPITIS THE SIGNIFICANCE OF ISOLATING THIS ORGANISM FROM A SINGLE SET OF BLOOD CULTURES WHEN MULTIPLE SETS ARE DRAWN IS UNCERTAIN. PLEASE NOTIFY THE MICROBIOLOGY DEPARTMENT WITHIN ONE WEEK IF SPECIATION AND SENSITIVITIES ARE REQUIRED. Performed at [facility], [address] [place], [place] [address]    Report Status 05/28/2024 FINAL  Final  Blood Culture ID Panel (Reflexed)     Status: Abnormal   Collection Time: 05/26/24  4:55 PM   Result Value Ref Range Status   Enterococcus faecalis NOT DETECTED NOT DETECTED Final   Enterococcus Faecium NOT DETECTED NOT DETECTED Final   Listeria monocytogenes NOT DETECTED NOT DETECTED Final   Staphylococcus species DETECTED (A) NOT DETECTED Final    Comment: CRITICAL RESULT CALLED TO, READ BACK BY AND VERIFIED WITH: PHARMD J LEDFORD 05/28/2024 @ 0226 BY AB    Staphylococcus aureus (BCID) NOT DETECTED NOT DETECTED Final   Staphylococcus epidermidis NOT DETECTED NOT DETECTED Final   Staphylococcus lugdunensis NOT DETECTED NOT DETECTED Final   Streptococcus species NOT DETECTED NOT DETECTED Final   Streptococcus agalactiae NOT DETECTED NOT DETECTED Final   Streptococcus pneumoniae NOT DETECTED NOT DETECTED Final   Streptococcus pyogenes NOT DETECTED NOT DETECTED Final   A.calcoaceticus-baumannii NOT DETECTED NOT DETECTED Final   Bacteroides fragilis NOT DETECTED NOT DETECTED Final   Enterobacterales NOT DETECTED NOT DETECTED Final   Enterobacter cloacae complex NOT DETECTED NOT DETECTED Final   Escherichia coli NOT DETECTED NOT DETECTED Final   Klebsiella aerogenes NOT DETECTED NOT DETECTED Final   Klebsiella oxytoca NOT DETECTED NOT DETECTED Final   Klebsiella pneumoniae NOT DETECTED NOT DETECTED Final   Proteus species NOT DETECTED NOT DETECTED Final   Salmonella species NOT DETECTED NOT DETECTED Final   Serratia marcescens NOT DETECTED NOT DETECTED Final   Haemophilus influenzae NOT DETECTED NOT DETECTED Final   Neisseria meningitidis NOT DETECTED NOT DETECTED Final   Pseudomonas aeruginosa NOT DETECTED NOT DETECTED Final   Stenotrophomonas maltophilia NOT DETECTED NOT DETECTED Final   Candida albicans NOT DETECTED NOT DETECTED Final   Candida auris NOT DETECTED NOT DETECTED Final   Candida glabrata NOT DETECTED NOT DETECTED Final   Candida krusei NOT DETECTED NOT DETECTED Final   Candida parapsilosis NOT DETECTED NOT DETECTED Final   Candida tropicalis NOT DETECTED NOT  DETECTED Final   Cryptococcus neoformans/gattii NOT DETECTED NOT DETECTED Final    Comment: Performed at [facility], [address], [place], [place] [address]  Blood culture (routine x 2)     Status: None (Preliminary result)   Collection Time: 05/26/24  5:00 PM   Specimen: BLOOD RIGHT ARM  Result Value Ref Range Status   Specimen Description BLOOD RIGHT ARM  Final   Special Requests   Final    AEROBIC BOTTLE ONLY Blood Culture results may not be optimal due to an inadequate volume of blood received in culture bottles  Culture   Final    NO GROWTH 4 DAYS Performed at [facility], [address], [place], [place] [address]    Report Status PENDING  Incomplete  Resp panel by RT-PCR (RSV, Flu A&B, Covid) Anterior Nasal Swab     Status: None   Collection Time: 05/26/24  5:52 PM   Specimen: Anterior Nasal Swab  Result Value Ref Range Status   SARS Coronavirus 2 by RT PCR NEGATIVE NEGATIVE Final   Influenza A by PCR NEGATIVE NEGATIVE Final   Influenza B by PCR NEGATIVE NEGATIVE Final    Comment: (NOTE) The Xpert Xpress SARS-CoV-2/FLU/RSV plus assay is intended as an aid in the diagnosis of influenza from Nasopharyngeal swab specimens and should not be used as a sole basis for treatment. Nasal washings and aspirates are unacceptable for Xpert Xpress SARS-CoV-2/FLU/RSV testing.  Fact Sheet for Patients: bloggercourse.com  Fact Sheet for Healthcare Providers: seriousbroker.it  This test is not yet approved or cleared by the [place]  FDA and has been authorized for detection and/or diagnosis of SARS-CoV-2 by FDA under an Emergency Use Authorization (EUA). This EUA will remain in effect (meaning this test can be used) for the duration of the COVID-19 declaration under Section 564(b)(1) of the Act, 21 U.S.C. section 360bbb-3(b)(1), unless the authorization is terminated or revoked.     Resp Syncytial Virus by PCR  NEGATIVE NEGATIVE Final    Comment: (NOTE) Fact Sheet for Patients: bloggercourse.com  Fact Sheet for Healthcare Providers: seriousbroker.it  This test is not yet approved or cleared by the [place]  FDA and has been authorized for detection and/or diagnosis of SARS-CoV-2 by FDA under an Emergency Use Authorization (EUA). This EUA will remain in effect (meaning this test can be used) for the duration of the COVID-19 declaration under Section 564(b)(1) of the Act, 21 U.S.C. section 360bbb-3(b)(1), unless the authorization is terminated or revoked.  Performed at [facility], [address], [place], [place] [address]   Urine Culture (for pregnant, neutropenic or urologic patients or patients with an indwelling urinary catheter)     Status: Abnormal   Collection Time: 05/27/24  1:02 AM   Specimen: Urine, Clean Catch  Result Value Ref Range Status   Specimen Description URINE, CLEAN CATCH  Final   Special Requests   Final    NONE Performed at [facility], [address], [place], [place] [address]    Culture MULTIPLE SPECIES PRESENT, SUGGEST RECOLLECTION (A)  Final   Report Status 05/28/2024 FINAL  Final    Procedures/Studies: DG Swallowing Func-Speech Pathology Result Date: 05/28/2024 Table formatting from the original result was not included. Modified Barium Swallow Study Patient Details Name: Carrah Eppolito MRN: [id number] Date of Birth: 05-Jun-1925 Today's Date: 05/28/2024 HPI/PMH: HPI: Pt is 89 year old presented to [facility] on  05/26/24 for fall and AMS. Admitted with UTI. CT C/A/P shows a large hiatal hernia but no focal consolidation or pulmonary edema. PMH: HTN, osteoporosis,DDD, depression, hip fx, GERD Clinical Impression: Pt exhibits mild pharyngeal dysphagia characterized by occasional mistiming and decreased BOT retraction. Epiglottic inversion and laryngeal elevation are complete. Timing becomes more  disorganized with larger boluses and the bolus pools in the pyriform sinuses before the swallow. This resulted in one instance of sensed aspiration with thin liquids as the bolus spilled over the pyriform sinuses before the laryngeal vestibule closes completely (PAS 7). Trace pharyngeal residue is suspected to be contributed to by reduced BOT retraction in addition to chronic degenerative  cervical spine changes (see CT) and a prominent cricopharyngeus. The 13 mm barium tablet was given with purees, resulting in distal esophageal retention. Anticipate episodic aspiration of trace amounts (which is considered a mild impairment) and pt does not exhibit significant factors that may increase risk in its presence. Continue current diet with meds given whole in puree. Encourage cueing to control the rate/volume of sips of liquid and use of frequent oral care. Will f/u for ongoing education with pt and her family.  DIGEST Swallow Severity Rating*             Safety: 1             Efficiency: 1             Overall Pharyngeal Swallow Severity: 1 (mild) 1: mild; 2: moderate; 3: severe; 4: profound *The Dynamic Imaging Grade of Swallowing Toxicity is standardized for the head and neck cancer population, however, demonstrates promising clinical applications across populations to standardize the clinical rating of pharyngeal swallow safety and severity. Factors that may increase risk of adverse event in presence of aspiration Noe & Lianne 2021): Factors that may increase risk of adverse event in presence of aspiration Noe & Lianne 2021): Reduced cognitive function; Limited mobility; Frail or deconditioned Recommendations/Plan: Swallowing Evaluation Recommendations Swallowing Evaluation Recommendations Recommendations: PO diet PO Diet Recommendation: Regular; Thin liquids (Level 0) Liquid Administration via: Cup; Straw Medication Administration: Whole meds with puree Supervision: Full assist for feeding; Full  supervision/cueing for swallowing strategies Swallowing strategies  : Slow rate; Small bites/sips Postural changes: Position pt fully upright for meals; Stay upright 30-60 min after meals Oral care recommendations: Oral care BID (2x/day) Recommended consults: Consider Palliative care Treatment Plan Treatment Plan Treatment recommendations: Defer treatment plan to SLP at other venue (see follow-up recommendations) Follow-up recommendations: Skilled nursing-short term rehab (<3 hours/day) Functional status assessment: Patient has had a recent decline in their functional status and demonstrates the ability to make significant improvements in function in a reasonable and predictable amount of time. Recommendations Recommendations for follow up therapy are one component of a multi-disciplinary discharge planning process, led by the attending physician.  Recommendations may be updated based on patient status, additional functional criteria and insurance authorization. Assessment: Orofacial Exam: Orofacial Exam Oral Cavity: Oral Hygiene: Xerostomia Oral Cavity - Dentition: Adequate natural dentition Orofacial Anatomy: WFL Oral Motor/Sensory Function: WFL Anatomy: Anatomy: Suspected cervical osteophytes; Prominent cricopharyngeus Boluses Administered: Boluses Administered Boluses Administered: Thin liquids (Level 0); Mildly thick liquids (Level 2, nectar thick); Moderately thick liquids (Level 3, honey thick); Puree; Solid  Oral Impairment Domain: Oral Impairment Domain Lip Closure: No labial escape Tongue control during bolus hold: Cohesive bolus between tongue to palatal seal Bolus preparation/mastication: Timely and efficient chewing and mashing Bolus transport/lingual motion: Brisk tongue motion Oral residue: Complete oral clearance Location of oral residue : N/A Initiation of pharyngeal swallow : Pyriform sinuses  Pharyngeal Impairment Domain: Pharyngeal Impairment Domain Soft palate elevation: No bolus between soft  palate (SP)/pharyngeal wall (PW) Laryngeal elevation: Complete superior movement of thyroid  cartilage with complete approximation of arytenoids to epiglottic petiole Anterior hyoid excursion: Complete anterior movement Epiglottic movement: Complete inversion Laryngeal vestibule closure: Complete, no air/contrast in laryngeal vestibule Pharyngeal stripping wave : Present - complete Pharyngeal contraction (A/P view only): N/A Pharyngoesophageal segment opening: Partial distention/partial duration, partial obstruction of flow Tongue base retraction: Wide column of contrast or air between tongue base and PPW Pharyngeal residue: Trace residue within or on pharyngeal structures Location of pharyngeal residue: Tongue  base; Valleculae; Pyriform sinuses  Esophageal Impairment Domain: Esophageal Impairment Domain Esophageal clearance upright position: Esophageal retention Pill: Pill Consistency administered: Puree Puree: WFL Penetration/Aspiration Scale Score: Penetration/Aspiration Scale Score 1.  Material does not enter airway: Moderately thick liquids (Level 3, honey thick); Puree; Solid; Pill 2.  Material enters airway, remains ABOVE vocal cords then ejected out: Mildly thick liquids (Level 2, nectar thick) 7.  Material enters airway, passes BELOW cords and not ejected out despite cough attempt by patient: Thin liquids (Level 0) Compensatory Strategies: Compensatory Strategies Compensatory strategies: No   General Information: Caregiver present: No  Diet Prior to this Study: Regular; Thin liquids (Level 0)   Temperature : Normal   Respiratory Status: WFL   Supplemental O2: None (Room air)   History of Recent Intubation: No  Behavior/Cognition: Alert; Cooperative; Requires cueing Self-Feeding Abilities: Needs assist with self-feeding Baseline vocal quality/speech: Normal Volitional Cough: Able to elicit Volitional Swallow: Able to elicit Exam Limitations: No limitations Goal Planning: Prognosis for improved oropharyngeal  function: Fair Barriers to Reach Goals: Cognitive deficits; Time post onset No data recorded Patient/Family Stated Goal: none stated Consulted and agree with results and recommendations: Patient; Family member/caregiver Pain: Pain Assessment Pain Assessment: Faces Faces Pain Scale: 0 Pain Location: all over Pain Descriptors / Indicators: Grimacing; Moaning Pain Intervention(s): Monitored during session End of Session: Start Time:SLP Start Time (ACUTE ONLY): 1017 Stop Time: SLP Stop Time (ACUTE ONLY): 1032 Time Calculation:SLP Time Calculation (min) (ACUTE ONLY): 15 min Charges: SLP Evaluations $ SLP Speech Visit: 1 Visit SLP Evaluations $BSS Swallow: 1 Procedure $MBS Swallow: 1 Procedure SLP visit diagnosis: SLP Visit Diagnosis: Dysphagia, pharyngeal phase (R13.13) Past Medical History: Past Medical History: Diagnosis Date  Abnormal mammogram, unspecified 04/19/2011  Allergy   Anxiety   Backache, unspecified 01/21/2005  Cervicalgia 08/20/2013  Chronic kidney disease, stage II (mild) 04/03/2009  Chronic rhinitis 09/28/2010  DDD (degenerative disc disease)   Depression   Dermatophytosis of nail 01/21/2005  Diverticulosis of colon (without mention of hemorrhage) 06/17/2005  Edema 11/26/2004  GERD (gastroesophageal reflux disease)   Hiatal hernia   High cholesterol   Hyperglycemia 11/18/2014  Hypertension   Insomnia, unspecified 10/18/2011  Left inguinal hernia 04/24/2014  Nocturia 09/19/2008  Osteoporosis, senile 11/18/2014  Other abnormal blood chemistry 04/03/2007  Pain in joint, pelvic region and thigh 11/12/2011  Pain in joint, shoulder region 08/15/2006  Pain in limb 08/26/2005  Palpitations 09/29/2009  PAOD (peripheral arterial occlusive disease) 11/18/2014  Right leg; diminished popliteal, dorsalis pedis, and posterior tibial artery pulsations   Postmenopausal atrophic vaginitis 12/14/2004  Scoliosis   Senile osteoporosis 12/14/2004  Tension headache 10/04/2005  Unspecified cataract 11/26/2004  Unspecified constipation 01/21/2005  Unspecified  urinary incontinence 11/26/2004  Vaginal stenosis 11/18/2014  Tight band about 1/2 inches into the vagina which will not allow passage of my index finger.  Past Surgical History: Past Surgical History: Procedure Laterality Date  ABDOMINAL HYSTERECTOMY  1969  Dr.Braun   bilateral eyelid surgery  2005  Dr.Scott  BILATERAL OOPHORECTOMY  2002  BREAST SURGERY    benign tumor removal, left breast 1969  CATARACT EXTRACTION    Right  CHOLECYSTECTOMY  1990  Dr.Moore   COLONOSCOPY  3/18/20008  inflammation, diverticular associated colitis -Dr. Teressa  COSMETIC SURGERY    ESOPHAGOGASTRODUODENOSCOPY N/A 01/24/2014  Procedure: ESOPHAGOGASTRODUODENOSCOPY (EGD);  Surgeon: Norleen LOISE Kiang, MD;  Location: [facility] ENDOSCOPY;  Service: Endoscopy;  Laterality: N/A;  ESOPHAGOGASTRODUODENOSCOPY ENDOSCOPY  04/04/14  Dr. Teressa  EYE SURGERY    FOREIGN BODY REMOVAL N/A  01/24/2014  Procedure: FOREIGN BODY REMOVAL;  Surgeon: Norleen LOISE Kiang, MD;  Location: [facility] ENDOSCOPY;  Service: Endoscopy;  Laterality: N/A;  HIP PINNING,CANNULATED  11/07/2011  Procedure: CANNULATED HIP PINNING;  Surgeon: Lynwood FORBES Better, MD;  Location: [facility] ORS;  Service: Orthopedics;  Laterality: Left;  ORIF FEMORAL NECK FRACTURE W/ [facility]  11/07/2011  Dr.Nitka   TONSILLECTOMY    Dr.Joe Claudene  TUBAL LIGATION    VAGINAL PROLAPSE REPAIR  2002  Dr.Horback  Damien Blumenthal, M.A., CCC-SLP Speech Language Pathology, Acute Rehabilitation Services Secure Chat preferred [phone] 05/28/2024, 11:22 AM  CT CERVICAL SPINE WO CONTRAST Result Date: 05/26/2024 EXAM: CT CERVICAL SPINE WITHOUT CONTRAST 05/26/2024 07:11:31 PM TECHNIQUE: CT of the cervical spine was performed without the administration of intravenous contrast. Multiplanar reformatted images are provided for review. Automated exposure control, iterative reconstruction, and/or weight based adjustment of the mA/kV was utilized to reduce the radiation dose to as low as reasonably achievable. COMPARISON: None available. CLINICAL HISTORY: Polytrauma,  blunt. FINDINGS: BONES AND ALIGNMENT: 3 mm anterolisthesis C7-T1, likely degenerative in nature. Degenerative ankylosis C4-C5. Ankylosis of the facet joints of C3-C5 bilaterally. No acute fracture or traumatic listhesis. Motion degraded examination. DEGENERATIVE CHANGES: Advanced degenerative disc disease throughout the cervical spine with disc space narrowing and endplate remodeling. 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 Multilevel uncovertebral and facet arthrosis results in multilevel moderate-to-severe neuroforaminal narrowing, most severe at C5-C6 and C6-C7. SOFT TISSUES: No prevertebral soft tissue swelling. IMPRESSION: 1. No acute fracture or traumatic listhesis. 2. Motion degraded examination. 3. 3 mm anterolisthesis at C7-T1, likely degenerative. 4. Advanced degenerative disc disease throughout the cervical spine with disc space narrowing and endplate remodeling. 5. Ankylosis of the facet joints of C3-C5 bilaterally. 6. Degenerative ankylosis at C4-5. 7. Moderate central canal stenosis at C4-5 secondary to posterior disc osteophyte complex and congenital narrowing of the pedicles. 8. Multilevel moderate-to-severe neuroforaminal narrowing, most severe at C5-6 and C6-7. Electronically signed by: Dorethia Molt MD MD 05/26/2024 07:50 PM EST RP Workstation: [id number]   CT CHEST ABDOMEN PELVIS WO CONTRAST Result Date: 05/26/2024 EXAM: CT CHEST, ABDOMEN AND PELVIS WITHOUT CONTRAST 05/26/2024 07:11:31 PM TECHNIQUE: CT of the chest, abdomen and pelvis was performed without the administration of intravenous contrast. Multiplanar reformatted images are provided for review. Automated exposure control, iterative reconstruction, and/or weight based adjustment of the mA/kV was utilized to reduce the radiation dose to as low as reasonably achievable. COMPARISON: 04/20/2014 CLINICAL HISTORY: Polytrauma, blunt. Multiple  unwitnessed fall, bruising, altered mental status. FINDINGS: CHEST: MEDIASTINUM AND LYMPH NODES: Heart and pericardium are unremarkable. Extensive multivessel coronary artery calcification. Extensive calcification of the aortic valve leaflets. The central pulmonary arteries are enlarged in keeping with changes of pulmonary arterial hypertension. Moderate atherosclerotic calcification within the thoracic aorta. 22 mm nodule within the left thyroid  lobe, not well assessed this examination. No follow-up imaging recommended unless clinically indicated. The central airways are clear. No mediastinal, hilar or axillary lymphadenopathy. LUNGS AND PLEURA: Large hiatal hernia, and large since prior examination, with the entire stomach, distal pancreas and the distal transverse and proximal descending colon within the thoracic compartment. 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 No focal consolidation or pulmonary edema. No pleural effusion. No pneumothorax. ABDOMEN AND PELVIS: LIVER: Unremarkable. GALLBLADDER AND BILE DUCTS: Status post cholecystectomy. No biliary ductal dilatation. SPLEEN: No acute abnormality. PANCREAS: No acute abnormality. ADRENAL GLANDS: No acute abnormality. KIDNEYS, URETERS AND BLADDER: Multiple nonobstructing calculi are seen within  the right renal pelvis measuring 4 and 10 mm. There is superimposed mild asymmetric right perinephric and peripelvic stranding. Perinephric and peripelvic stranding from the right may reflect residual of a recently passed renal calculus. Alternatively, inflammatory condition such as an ascending urinary tract infection could appear similarly. Correlation with urinalysis and urine culture may be helpful for further management. No hydronephrosis. The left kidney is unremarkable; no intrarenal or ureteral calculi are identified and there is no hydronephrosis. Several granular dependently layering calcifications are seen within the bladder lumen measuring  2-3 mm in size. The bladder is partially decompressed. There is, however, circumferential bladder wall thickening noted which may reflect changes of underlying cystitis or chronic bladder outlet obstruction. GI AND BOWEL: Severe descending and sigmoid colonic diverticulosis without superimposed acute inflammatory change. The visualized bowel is otherwise unremarkable. No evidence of obstruction. Appendix normal. REPRODUCTIVE ORGANS: Status post hysterectomy. No acute abnormality. PERITONEUM AND RETROPERITONEUM: No ascites. No free air. VASCULATURE: Aorta is normal in caliber. Moderate aortoiliac atherosclerotic calcification. ABDOMINAL AND PELVIS LYMPH NODES: No lymphadenopathy. BONES AND SOFT TISSUES: Left hip pinning has been performed. Severe lumbar dextroscoliosis. Osseous structures are age appropriate with advanced degenerative changes seen within the thoracolumbar spine. No focal soft tissue abnormality. Had some masses. IMPRESSION: 1. Large hiatal hernia with organoaxial gastric volvulus without evidence of obstruction. 2. Multiple nonobstructing calculi in the right renal pelvis with mild asymmetric right perinephric and peripelvic stranding, which may reflect residual of a recently passed renal calculus or an ascending urinary tract infection; urinalysis and urine culture may be helpful for further management. 3. Circumferential bladder wall thickening, which may reflect cystitis or chronic bladder outlet obstruction. 4. Severe descending and sigmoid colonic diverticulosis without acute inflammatory change. 5. Enlarged central pulmonary arteries, in keeping with pulmonary arterial hypertension. 6. Extensive multivessel coronary artery calcification and extensive calcification of the aortic valve leaflets. 7. Left thyroid  nodule measuring 22 mm, not well assessed on this examination; no follow-up imaging recommended unless clinically indicated. 8. Severe lumbar dextroscoliosis with advanced degenerative  changes of the thoracolumbar spine. 9. Raf score includes aortic atherosclerosis (ICD10-I70.0). Electronically signed by: Dorethia Molt MD MD 05/26/2024 07:45 PM EST RP Workstation: [id number]   CT T-SPINE NO CHARGE Result Date: 05/26/2024 EXAM: CT THORACIC SPINE WITHOUT CONTRAST 05/26/2024 07:11:31 PM TECHNIQUE: CT of the thoracic spine was performed without the administration of intravenous contrast. Multiplanar reformatted images are provided for review. Automated exposure control, iterative reconstruction, and/or weight based adjustment of the mA/kV was utilized to reduce the radiation dose to as low as reasonably achievable. COMPARISON: None available. CLINICAL HISTORY: FINDINGS: BONES AND ALIGNMENT: Normal vertebral body heights. 2 mm anterolisthesis T2-3, likely degenerative in nature. No acute fracture or traumatic listhesis of the thoracic spine. No suspicious bone lesion. DEGENERATIVE CHANGES: Intervertebral disc space narrowing and endplate remodeling throughout the thoracic spine in keeping with changes of moderate-to-severe degenerative disc disease. No high-grade canal stenosis. Moderate bilateral neuroforaminal narrowing at T8-9. No high-grade neural foraminal narrowing identified. SOFT TISSUES: Left lower lobe consolidation, better assessed on accompanying CT examination of the chest, abdomen, and pelvis. Please refer to that report for further details. IMPRESSION: 1. No acute fracture or traumatic listhesis of the thoracic spine. 2. Left lower lobe consolidation. 3. Moderate-to-severe degenerative disc disease throughout the thoracic spine, without high-grade canal stenosis. 4. Moderate bilateral neuroforaminal narrowing at T8-9. 5. Minimal anterolisthesis at T2-3, likely degenerative. Electronically signed by: Dorethia Molt MD MD 05/26/2024 07:33 PM EST RP Workstation: [id number]   CT L-SPINE  NO CHARGE Result Date: 05/26/2024 EXAM: CT OF THE LUMBAR SPINE WITHOUT CONTRAST 05/26/2024 07:11:31  PM TECHNIQUE: CT of the lumbar spine was performed without the administration of intravenous contrast. Multiplanar reformatted images are provided for review. Automated exposure control, iterative reconstruction, and/or weight based adjustment of the mA/kV was utilized to reduce the radiation dose to as low as reasonably achievable. COMPARISON: None available. CLINICAL HISTORY: FINDINGS: BONES AND ALIGNMENT: Normal vertebral body heights. No acute fracture or suspicious bone lesion. No traumatic listhesis. Severe lumbar dextroscoliosis, apex right at L3. Mild loss of the normal lumbar lordosis. There is degenerative ankylosis of the L2 and L3 vertebral bodies along the left lateral aspect. DEGENERATIVE CHANGES: There is diffuse intervertebral disc space narrowing, endplate remodeling, and vacuum disc phenomena throughout the lumbar spine in keeping with changes of severe degenerative disc disease. Multifactorial moderate canal stenosis at L3-4 and L4-5. Severe right neuroforaminal narrowing at L4-5 and L5-S1 secondary to disc height loss and osteophytic ridging and facet arthrosis. Moderate-to-severe left neuroforaminal narrowing at L3-4 and L5-S1. SOFT TISSUES: Multiple nonobstructing calculi within the right kidney measuring up to 11 mm. Moderate aortoiliac atherosclerotic calcification. Severe sigmoid diverticulosis noted. IMPRESSION: 1. No acute fracture or traumatic listhesis. 2. Severe lumbar dextroscoliosis, apex right at L3, with mild loss of the normal lumbar lordosis. 3. Severe degenerative disc disease with diffuse intervertebral disc space narrowing, endplate remodeling, and vacuum disc phenomena throughout the lumbar spine, with degenerative ankylosis of the L2 and L3 vertebral bodies along the left lateral aspect. 4. Multifactorial moderate canal stenosis at L3-4 and L4-5. 5. Severe right neuroforaminal narrowing at L4-5 and L5-S1, with moderate-to-severe left neuroforaminal narrowing at L3-4 and  L5-S1. 6. Multiple nonobstructing right renal calculi measuring up to 11 mm. 7. Moderate aortoiliac atherosclerotic calcification. 8. Severe sigmoid diverticulosis. Electronically signed by: Dorethia Molt MD MD 05/26/2024 07:30 PM EST RP Workstation: [id number]   CT HEAD WO CONTRAST Result Date: 05/26/2024 EXAM: CT HEAD WITHOUT CONTRAST 05/26/2024 07:11:31 PM TECHNIQUE: CT of the head was performed without the administration of intravenous contrast. Automated exposure control, iterative reconstruction, and/or weight based adjustment of the mA/kV was utilized to reduce the radiation dose to as low as reasonably achievable. COMPARISON: None available. CLINICAL HISTORY: Head trauma, moderate-severe. FINDINGS: BRAIN AND VENTRICLES: No acute hemorrhage. No evidence of acute infarct. No hydrocephalus. No extra-axial collection. No mass effect or midline shift. Cerebral volume loss. Moderate patchy supratentorial white matter hypodensities. ORBITS: No acute abnormality. SINUSES: Moderate mucosal thickening in left ethmoid sinus. SOFT TISSUES AND SKULL: No acute soft tissue abnormality. No skull fracture. VASCULATURE: Atherosclerosis. LIMITATIONS/ARTIFACTS: Motion artifact. IMPRESSION: 1. No acute intracranial abnormality. 2. Chronic cerebral volume loss and moderate chronic supratentorial white matter change. 3. Atherosclerosis. Electronically signed by: Dorethia Molt MD MD 05/26/2024 07:17 PM EST RP Workstation: [id number]     Time coordinating discharge: Over 30 minutes    Alm Apo, MD  [facility] 05/30/2024, 11:32 AM    [1]  Allergies Allergen Reactions   Brexpiprazole Swelling   Lamictal [Lamotrigine] Swelling    Lip swelling   Penicillins Other (See Comments)    CHILDHOOD REACTION   Abilify [Aripiprazole] Rash   "

## 2024-05-30 NOTE — TOC Progression Note (Signed)
 Transition of Care [facility]) - Progression Note    Patient Details  Name: Tina Hampton MRN: [id number] Date of Birth: Sep 10, 1925  Transition of Care [facility]) CM/SW Contact  Rosaline JONELLE Joe, RN Phone Number: 05/30/2024, 2:52 PM  Clinical Narrative:    Patient is waiting for transport to [facility] by [facility] at this time.  [place], CM with [facility] [place]  called and states that patient will be moving into the facility in the next week and requests hospital bed and wheelchair orders be placed.  DMe orders placed and co-signed by the MD.  [facility] was faxed orders and clinicals so they could reach out to a DME company in the [place] area to deliver the the facility for her care.     Barriers to Discharge: Barriers Resolved               Expected Discharge Plan and Services         Expected Discharge Date: 05/30/24                                     Social Drivers of Health (SDOH) Interventions SDOH Screenings   Food Insecurity: No Food Insecurity (05/27/2024)  Housing: Low Risk (05/27/2024)  Transportation Needs: No Transportation Needs (05/27/2024)  Utilities: Not At Risk (08/11/2023)  Alcohol  Screen: Low Risk (08/11/2023)  Depression (PHQ2-9): High Risk (01/29/2024)  Financial Resource Strain: Low Risk (08/11/2023)  Physical Activity: Insufficiently Active (08/11/2023)  Social Connections: Socially Isolated (08/11/2023)  Stress: Stress Concern Present (08/11/2023)  Tobacco Use: Low Risk (05/27/2024)    Readmission Risk Interventions    05/28/2024    9:32 AM  Readmission Risk Prevention Plan  Post Dischage Appt Complete  Medication Screening Complete  Transportation Screening Complete

## 2024-05-31 ENCOUNTER — Encounter: Payer: Self-pay | Admitting: Nurse Practitioner

## 2024-05-31 ENCOUNTER — Non-Acute Institutional Stay (SKILLED_NURSING_FACILITY): Payer: Self-pay | Admitting: Nurse Practitioner

## 2024-05-31 DIAGNOSIS — I10 Essential (primary) hypertension: Secondary | ICD-10-CM

## 2024-05-31 DIAGNOSIS — M545 Low back pain, unspecified: Secondary | ICD-10-CM

## 2024-05-31 DIAGNOSIS — K5901 Slow transit constipation: Secondary | ICD-10-CM | POA: Diagnosis not present

## 2024-05-31 DIAGNOSIS — F411 Generalized anxiety disorder: Secondary | ICD-10-CM

## 2024-05-31 DIAGNOSIS — K219 Gastro-esophageal reflux disease without esophagitis: Secondary | ICD-10-CM | POA: Diagnosis not present

## 2024-05-31 DIAGNOSIS — D649 Anemia, unspecified: Secondary | ICD-10-CM | POA: Diagnosis not present

## 2024-05-31 DIAGNOSIS — E785 Hyperlipidemia, unspecified: Secondary | ICD-10-CM | POA: Diagnosis not present

## 2024-05-31 DIAGNOSIS — N3001 Acute cystitis with hematuria: Secondary | ICD-10-CM

## 2024-05-31 DIAGNOSIS — G8929 Other chronic pain: Secondary | ICD-10-CM | POA: Diagnosis not present

## 2024-05-31 DIAGNOSIS — N3281 Overactive bladder: Secondary | ICD-10-CM | POA: Diagnosis not present

## 2024-05-31 LAB — CULTURE, BLOOD (ROUTINE X 2): Culture: NO GROWTH

## 2024-05-31 NOTE — Assessment & Plan Note (Signed)
 Hgb 9.7 05/29/24 TSH, CBC/diff, Vit B12, Iron, ferritin next lab.

## 2024-05-31 NOTE — Assessment & Plan Note (Signed)
 Hospitalized 03/26/2025 to 03/30/2025 for acute cystitis with hematuria-fully treated in hospital-complete 2 more days of cefadroxil  in SNF

## 2024-05-31 NOTE — Progress Notes (Unsigned)
 " Location:  [facility] Room Number: N040A Place of Service:  SNF (31) Provider:  Izreal Kock X, NP   Patient Care Team: [name], MD as PCP - General (Internal Medicine) Devora, [facility] Octavia Charleston, MD as Consulting Physician (Ophthalmology) Teressa Toribio SQUIBB, MD (Inactive) as Consulting Physician (Gastroenterology) Tasia Lung, MD as Consulting Physician (Psychiatry) Lucilla Lynwood BRAVO, MD (Inactive) as Consulting Physician (Orthopedic Surgery) Gaston Hamilton, MD as Consulting Physician (Urology)  Extended Emergency Contact Information Primary Emergency Contact: [name], [name], [name] [place] Home Phone: [phone] Mobile Phone: [phone] Relation: Son Secondary Emergency Contact: [name],Worth Mobile Phone: [phone] Relation: Son  Code Status:  DNR Goals of care: Advanced Directive information    05/27/2024    3:47 PM  Advanced Directives  Type of Advance Directive Healthcare Power of Attorney  Does patient want to make changes to medical advance directive? No - Patient declined     Chief Complaint  Patient presents with   Medication Review    HPI:  Pt is a 89 y.o. female seen today for an acute visit for medication review following hospital stay  Hospitalized 03/26/2025 to 03/30/2025 for acute cystitis with hematuria-fully treated in hospital-complete 2 more days of cefadroxil  in SNF, s/p fall, generalized weakness, resolved acute metabolic encephalopathy and AKI. Hx of vaginal bleeding/spoting on and off since the prolapsed vagina repair due to the surgical scar tissue. declined GYN f/u.              GERD, stable, on Pantoprazole              Constipation, on Senokot, MiraLax , lubiprostone              Lower back pain, taking Tylenol ,  ambulates with walker.              HTN, blood pressure is controlled, on Nifedipine , ASA, Bun/creat 26/0.78 05/29/24  Chronic anemia, Hgb 9.7  05/29/24             Anxiety seen by Psych, off Clonazepam /Lorazepam , on Hydroxyzine .              Depression: takes Mirtazapine , Lexapro , Wellbutrin , hydroxyzine , off clonazepam /Lorazepam , TSH 2.22 07/31/23             Dizziness, on and off, chronic, seen by Neurology,  MRI brain-Patient declined it.              Hyperlipidemia, takes Pravastatin . LDL 76 07/31/23             Urinary incontinent, more at night.      Past Medical History:  Diagnosis Date   Abnormal mammogram, unspecified 04/19/2011   Allergy    Anxiety    Backache, unspecified 01/21/2005   Cervicalgia 08/20/2013   Chronic kidney disease, stage II (mild) 04/03/2009   Chronic rhinitis 09/28/2010   DDD (degenerative disc disease)    Depression    Dermatophytosis of nail 01/21/2005   Diverticulosis of colon (without mention of hemorrhage) 06/17/2005   Edema 11/26/2004   GERD (gastroesophageal reflux disease)    Hiatal hernia    High cholesterol    Hyperglycemia 11/18/2014   Hypertension    Insomnia, unspecified 10/18/2011   Left inguinal hernia 04/24/2014   Nocturia 09/19/2008   Osteoporosis, senile 11/18/2014   Other abnormal blood chemistry 04/03/2007   Pain in joint, pelvic region and thigh 11/12/2011   Pain in joint, shoulder region 08/15/2006  Pain in limb 08/26/2005   Palpitations 09/29/2009   PAOD (peripheral arterial occlusive disease) 11/18/2014   Right leg; diminished popliteal, dorsalis pedis, and posterior tibial artery pulsations    Postmenopausal atrophic vaginitis 12/14/2004   Scoliosis    Senile osteoporosis 12/14/2004   Tension headache 10/04/2005   Unspecified cataract 11/26/2004   Unspecified constipation 01/21/2005   Unspecified urinary incontinence 11/26/2004   Vaginal stenosis 11/18/2014   Tight band about 1/2 inches into the vagina which will not allow passage of my index finger.    Past Surgical History:  Procedure Laterality Date   ABDOMINAL HYSTERECTOMY  1969   Dr.Braun     bilateral eyelid surgery  2005   Dr.Scott   BILATERAL OOPHORECTOMY  2002   BREAST SURGERY     benign tumor removal, left breast 1969   CATARACT EXTRACTION     Right   CHOLECYSTECTOMY  1990   Dr.Moore    COLONOSCOPY  3/18/20008   inflammation, diverticular associated colitis -Dr. Teressa   COSMETIC SURGERY     ESOPHAGOGASTRODUODENOSCOPY N/A 01/24/2014   Procedure: ESOPHAGOGASTRODUODENOSCOPY (EGD);  Surgeon: Norleen LOISE Kiang, MD;  Location: [facility] ENDOSCOPY;  Service: Endoscopy;  Laterality: N/A;   ESOPHAGOGASTRODUODENOSCOPY ENDOSCOPY  04/04/14   Dr. Teressa   EYE SURGERY     FOREIGN BODY REMOVAL N/A 01/24/2014   Procedure: FOREIGN BODY REMOVAL;  Surgeon: Norleen LOISE Kiang, MD;  Location: [facility] ENDOSCOPY;  Service: Endoscopy;  Laterality: N/A;   HIP PINNING,CANNULATED  11/07/2011   Procedure: CANNULATED HIP PINNING;  Surgeon: Lynwood FORBES Better, MD;  Location: [facility] ORS;  Service: Orthopedics;  Laterality: Left;   ORIF FEMORAL NECK FRACTURE W/ [facility]  11/07/2011   Dr.Nitka    TONSILLECTOMY     Dr.Joe Claudene   TUBAL LIGATION     VAGINAL PROLAPSE REPAIR  2002   Dr.Horback     Allergies[1]  Outpatient Encounter Medications as of 05/31/2024  Medication Sig   acetaminophen  (TYLENOL ) 325 MG tablet Take 650 mg by mouth every 4 (four) hours as needed for fever.   Acetylcysteine (NAC) 500 MG CAPS Take 600 mg by mouth daily.   antiseptic oral rinse (BIOTENE) LIQD 10 mLs by Mouth Rinse route as needed for dry mouth. Resident may self administer and keep in room   aspirin  81 MG chewable tablet Chew 81 mg by mouth daily.   bismuth subsalicylate (PEPTO BISMOL) 262 MG/15ML suspension Take 30 mLs by mouth daily as needed for diarrhea or loose stools.   buPROPion  (WELLBUTRIN  XL) 150 MG 24 hr tablet Take 150 mg by mouth daily.   Calcium  Carb-Cholecalciferol  600-800 MG-UNIT TABS Take 1 tablet by mouth 2 (two) times daily.   cefadroxil  (DURICEF) 500 MG capsule Take 1 capsule (500 mg total) by mouth 2 (two)  times daily for 2 days.   cholecalciferol  (VITAMIN D ) 1000 UNITS tablet Take 1,000 Units by mouth daily.   docusate sodium  (COLACE) 100 MG capsule Take 1 capsule (100 mg total) by mouth every other day.   escitalopram  (LEXAPRO ) 20 MG tablet Take 1 tablet (20 mg total) by mouth daily.   Glucosamine HCl 500 MG TABS Take 4 capsules by mouth 2 (two) times daily.   hydrocortisone  1 % ointment Apply 1 Application topically 2 (two) times daily. Apply to perineum   hydrocortisone  2.5 % cream Apply 1 Application topically See admin instructions. Apply to anal topically as needed for irriation as needed twice daily   hydrOXYzine  (ATARAX ) 10 MG tablet Take 1 tablet (10 mg total)  by mouth 2 (two) times daily.   hypromellose (GENTEAL SEVERE) 0.3 % GEL ophthalmic ointment Place 1 Application into both eyes as needed for dry eyes.   lubiprostone  (AMITIZA ) 8 MCG capsule Take 8 mcg by mouth 2 (two) times daily with a meal.   Menthol , Topical Analgesic, 4 % GEL Apply 1 application  topically as needed. Resident may self administer and keep in room   mirtazapine  (REMERON ) 45 MG tablet Take 1 tablet (45 mg total) by mouth at bedtime.   Multiple Vitamins-Minerals (PRESERVISION AREDS 2+MULTI VIT PO) Take 1 tablet by mouth 2 (two) times daily.   NIFEdipine  (ADALAT  CC) 60 MG 24 hr tablet Take 60 mg by mouth daily.   pantoprazole  (PROTONIX ) 40 MG tablet One daily to reduce stomach acid (Patient taking differently: Take 40 mg by mouth daily. One daily to reduce stomach acid)   polyethylene glycol (MIRALAX  / GLYCOLAX ) 17 g packet Take 17 g by mouth daily. Mix 17g  with 8 oz. of fluid daily for constipation   Povidone (IVIZIA DRY EYES OP) Place 2 drops into both eyes 4 (four) times daily.   pravastatin  (PRAVACHOL ) 10 MG tablet Take 10 mg by mouth daily.   No facility-administered encounter medications on file as of 05/31/2024.    Review of Systems  Constitutional:  Positive for fatigue. Negative for fever  and unexpected weight change.  HENT:  Positive for hearing loss. Negative for congestion and voice change.   Eyes:  Negative for visual disturbance.  Respiratory:  Negative for cough and shortness of breath.   Cardiovascular:  Positive for leg swelling. Negative for chest pain and palpitations.  Gastrointestinal:  Negative for abdominal pain, constipation, nausea and vomiting.  Genitourinary:  Negative for difficulty urinating, dysuria and urgency.       Incontinent of urine  Musculoskeletal:  Positive for arthralgias, back pain and gait problem.  Skin:  Negative for color change and pallor.  Neurological:  Positive for tremors and numbness. Negative for speech difficulty, weakness, light-headedness and headaches.       Right finger resting tremor mild, left fingers feels numb  Psychiatric/Behavioral:  Positive for dysphoric mood. Negative for behavioral problems, hallucinations and sleep disturbance. The patient is nervous/anxious.        Worries, fears    Immunization History  Administered Date(s) Administered   Fluad Quad(high Dose 65+) 03/09/2022, 03/14/2023   INFLUENZA, HIGH DOSE SEASONAL PF 03/14/2017, 02/26/2019, 03/14/2023   Influenza Whole 02/14/2012, 02/14/2013   Influenza-Unspecified 02/27/2014, 02/12/2015, 02/25/2016, 03/15/2017, 02/19/2018, 02/18/2020, 03/09/2021, 02/20/2024   MODERNA COVID-19 SARS-COV-2 PEDS BIVALENT BOOSTER 58yr-11yr 03/22/2022   Moderna SARS-COV2 Booster Vaccination 10/21/2020   Moderna Sars-Covid-2 Vaccination 05/20/2019, 06/17/2019, 03/30/2020, 03/22/2022   PFIZER(Purple Top)SARS-COV-2 Vaccination 02/03/2021   Pneumococcal Conjugate-13 07/13/2015   Pneumococcal Polysaccharide-23 05/16/1998   Td 05/16/2004   Unspecified SARS-COV-2 Vaccination 03/22/2023   Zoster Recombinant(Shingrix) 02/20/2018, 05/11/2018, 07/16/2018   Zoster, Live 05/16/2005   Pertinent  Health Maintenance Due  Topic Date Due   Influenza Vaccine  Completed   Bone  Density Scan  Completed   Mammogram  Discontinued      12/26/2022   10:48 AM 03/20/2023   12:58 PM 08/11/2023   11:25 AM 01/29/2024   11:39 AM 01/31/2024    3:53 PM  Fall Risk  Falls in the past year? 0 0 0 0 0  Was there an injury with Fall? 0  0  0  0  0   Fall Risk Category Calculator 0 0 0 0 0  Patient at Risk for Falls Due to History of fall(s);Impaired balance/gait History of fall(s);Impaired balance/gait;Impaired mobility History of fall(s);Impaired mobility History of fall(s) History of fall(s)  Fall risk Follow up Falls evaluation completed;Education provided;Falls prevention discussed Falls evaluation completed;Education provided;Falls prevention discussed Falls evaluation completed;Education provided Falls evaluation completed Falls evaluation completed     Data saved with a previous flowsheet row definition   Functional Status Survey:    Vitals:   05/31/24 0939  BP: 123/65  Pulse: 86  Resp: 18  Temp: 98.1 F (36.7 C)  SpO2: 98%  Height: 5' 2 (1.575 m)   Body mass index is 25.81 kg/m. Physical Exam Vitals and nursing note reviewed.  Constitutional:      Appearance: Normal appearance.  HENT:     Head: Normocephalic and atraumatic.     Mouth/Throat:     Mouth: Mucous membranes are moist.  Eyes:     Extraocular Movements: Extraocular movements intact.     Conjunctiva/sclera: Conjunctivae normal.     Pupils: Pupils are equal, round, and reactive to light.  Cardiovascular:     Rate and Rhythm: Normal rate and regular rhythm.     Heart sounds: Murmur heard.  Pulmonary:     Breath sounds: No rales.  Abdominal:     General: Bowel sounds are normal.     Palpations: Abdomen is soft.     Tenderness: There is no abdominal tenderness. There is no right CVA tenderness, left CVA tenderness, guarding or rebound.  Genitourinary:    Rectum: Normal.     Comments: No internal or external hemorrhoids noted, no hard stools in the rectal vault.  Musculoskeletal:      Cervical back: Normal range of motion and neck supple.     Right lower leg: Edema present.     Left lower leg: Edema present.     Comments: Trace edema BLE. TED  Skin:    General: Skin is warm and dry.  Neurological:     General: No focal deficit present.     Mental Status: She is alert and oriented to person, place, and time. Mental status is at baseline.     Gait: Gait abnormal.  Psychiatric:     Comments: Anxious, repetitive.      Labs reviewed: Recent Labs    05/27/24 0237 05/28/24 0506 05/29/24 0235  NA 134* 136 135  K 4.3 3.8 3.6  CL 101 101 102  CO2 23 25 24   GLUCOSE 110* 90 112*  BUN 33* 28* 26*  CREATININE 1.19* 0.96 0.78  CALCIUM  9.5 9.2 9.1  MG  --   --  2.1   Recent Labs    07/31/23 0000 05/26/24 1649  AST 14 303*  ALT 8 114*  ALKPHOS 67 96  BILITOT  --  0.5  PROT  --  7.3  ALBUMIN 3.9 3.7   Recent Labs    05/27/24 0237 05/28/24 0506 05/29/24 0235  WBC 14.0* 9.0 6.4  NEUTROABS  --   --  5.0  HGB 10.5* 10.1* 9.7*  HCT 32.8* 30.8* 28.9*  MCV 93.4 91.7 90.6  PLT 126* 107* 121*   Lab Results  Component Value Date   TSH 2.22 07/31/2023   Lab Results  Component Value Date   HGBA1C 5.5 09/28/2018   Lab Results  Component Value Date   CHOL 141 07/31/2023   HDL 46 07/31/2023   LDLCALC 76 07/31/2023   TRIG 108 07/31/2023   CHOLHDL 3.5 08/04/2017    Significant Diagnostic Results  in last 30 days:  DG Swallowing Func-Speech Pathology Result Date: 05/28/2024 Table formatting from the original result was not included. Modified Barium Swallow Study Patient Details Name: Tina Hampton MRN: [id number] Date of Birth: [birth year] Today's Date: 05/28/2024 HPI/PMH: HPI: Pt is 89 year old presented [facility]  [facility] for fall and AMS. Admitted with UTI. CT C/A/P shows a large hiatal hernia but no focal consolidation or pulmonary edema. PMH: HTN, osteoporosis,DDD, depression, hip fx, GERD Clinical Impression: Pt exhibits mild pharyngeal dysphagia  characterized by occasional mistiming and decreased BOT retraction. Epiglottic inversion and laryngeal elevation are complete. Timing becomes more disorganized with larger boluses and the bolus pools in the pyriform sinuses before the swallow. This resulted in one instance of sensed aspiration with thin liquids as the bolus spilled over the pyriform sinuses before the laryngeal vestibule closes completely (PAS 7). Trace pharyngeal residue is suspected to be contributed to by reduced BOT retraction in addition to chronic degenerative cervical spine changes (see CT) and a prominent cricopharyngeus. The 13 mm barium tablet was given with purees, resulting in distal esophageal retention. Anticipate episodic aspiration of trace amounts (which is considered a mild impairment) and pt does not exhibit significant factors that may increase risk in its presence. Continue current diet with meds given whole in puree. Encourage cueing to control the rate/volume of sips of liquid and use of frequent oral care. Will f/u for ongoing education with pt and her family.  DIGEST Swallow Severity Rating*             Safety: 1             Efficiency: 1             Overall Pharyngeal Swallow Severity: 1 (mild) 1: mild; 2: moderate; 3: severe; 4: profound *The Dynamic Imaging Grade of Swallowing Toxicity is standardized for the head and neck cancer population, however, demonstrates promising clinical applications across populations to standardize the clinical rating of pharyngeal swallow safety and severity. Factors that may increase risk of adverse event in presence of aspiration Noe & Lianne 2021): Factors that may increase risk of adverse event in presence of aspiration Noe & Lianne 2021): Reduced cognitive function; Limited mobility; Frail or deconditioned Recommendations/Plan: Swallowing Evaluation Recommendations Swallowing Evaluation Recommendations Recommendations: PO diet PO Diet Recommendation: Regular; Thin liquids  (Level 0) Liquid Administration via: Cup; Straw Medication Administration: Whole meds with puree Supervision: Full assist for feeding; Full supervision/cueing for swallowing strategies Swallowing strategies  : Slow rate; Small bites/sips Postural changes: Position pt fully upright for meals; Stay upright 30-60 min after meals Oral care recommendations: Oral care BID (2x/day) Recommended consults: Consider Palliative care Treatment Plan Treatment Plan Treatment recommendations: Defer treatment plan to SLP at other venue (see follow-up recommendations) Follow-up recommendations: Skilled nursing-short term rehab (<3 hours/day) Functional status assessment: Patient has had a recent decline in their functional status and demonstrates the ability to make significant improvements in function in a reasonable and predictable amount of time. Recommendations Recommendations for follow up therapy are one component of a multi-disciplinary discharge planning process, led by the attending physician.  Recommendations may be updated based on patient status, additional functional criteria and insurance authorization. Assessment: Orofacial Exam: Orofacial Exam Oral Cavity: Oral Hygiene: Xerostomia Oral Cavity - Dentition: Adequate natural dentition Orofacial Anatomy: WFL Oral Motor/Sensory Function: WFL Anatomy: Anatomy: Suspected cervical osteophytes; Prominent cricopharyngeus Boluses Administered: Boluses Administered Boluses Administered: Thin liquids (Level 0); Mildly thick liquids (Level 2, nectar thick); Moderately thick liquids (Level  3, honey thick); Puree; Solid  Oral Impairment Domain: Oral Impairment Domain Lip Closure: No labial escape Tongue control during bolus hold: Cohesive bolus between tongue to palatal seal Bolus preparation/mastication: Timely and efficient chewing and mashing Bolus transport/lingual motion: Brisk tongue motion Oral residue: Complete oral clearance Location of oral residue : N/A Initiation of  pharyngeal swallow : Pyriform sinuses  Pharyngeal Impairment Domain: Pharyngeal Impairment Domain Soft palate elevation: No bolus between soft palate (SP)/pharyngeal wall (PW) Laryngeal elevation: Complete superior movement of thyroid  cartilage with complete approximation of arytenoids to epiglottic petiole Anterior hyoid excursion: Complete anterior movement Epiglottic movement: Complete inversion Laryngeal vestibule closure: Complete, no air/contrast in laryngeal vestibule Pharyngeal stripping wave : Present - complete Pharyngeal contraction (A/P view only): N/A Pharyngoesophageal segment opening: Partial distention/partial duration, partial obstruction of flow Tongue base retraction: Wide column of contrast or air between tongue base and PPW Pharyngeal residue: Trace residue within or on pharyngeal structures Location of pharyngeal residue: Tongue base; Valleculae; Pyriform sinuses  Esophageal Impairment Domain: Esophageal Impairment Domain Esophageal clearance upright position: Esophageal retention Pill: Pill Consistency administered: Puree Puree: WFL Penetration/Aspiration Scale Score: Penetration/Aspiration Scale Score 1.  Material does not enter airway: Moderately thick liquids (Level 3, honey thick); Puree; Solid; Pill 2.  Material enters airway, remains ABOVE vocal cords then ejected out: Mildly thick liquids (Level 2, nectar thick) 7.  Material enters airway, passes BELOW cords and not ejected out despite cough attempt by patient: Thin liquids (Level 0) Compensatory Strategies: Compensatory Strategies Compensatory strategies: No   General Information: Caregiver present: No  Diet Prior to this Study: Regular; Thin liquids (Level 0)   Temperature : Normal   Respiratory Status: WFL   Supplemental O2: None (Room air)   History of Recent Intubation: No  Behavior/Cognition: Alert; Cooperative; Requires cueing Self-Feeding Abilities: Needs assist with self-feeding Baseline vocal quality/speech: Normal Volitional  Cough: Able to elicit Volitional Swallow: Able to elicit Exam Limitations: No limitations Goal Planning: Prognosis for improved oropharyngeal function: Fair Barriers to Reach Goals: Cognitive deficits; Time post onset No data recorded Patient/Family Stated Goal: none stated Consulted and agree with results and recommendations: Patient; Family member/caregiver Pain: Pain Assessment Pain Assessment: Faces Faces Pain Scale: 0 Pain Location: all over Pain Descriptors / Indicators: Grimacing; Moaning Pain Intervention(s): Monitored during session End of Session: Start Time:SLP Start Time (ACUTE ONLY): 1017 Stop Time: SLP Stop Time (ACUTE ONLY): 1032 Time Calculation:SLP Time Calculation (min) (ACUTE ONLY): 15 min Charges: SLP Evaluations $ SLP Speech Visit: 1 Visit SLP Evaluations $BSS Swallow: 1 Procedure $MBS Swallow: 1 Procedure SLP visit diagnosis: SLP Visit Diagnosis: Dysphagia, pharyngeal phase (R13.13) Past Medical History: Past Medical History: Diagnosis Date  Abnormal mammogram, unspecified 04/19/2011  Allergy   Anxiety   Backache, unspecified 01/21/2005  Cervicalgia 08/20/2013  Chronic kidney disease, stage II (mild) 04/03/2009  Chronic rhinitis 09/28/2010  DDD (degenerative disc disease)   Depression   Dermatophytosis of nail 01/21/2005  Diverticulosis of colon (without mention of hemorrhage) 06/17/2005  Edema 11/26/2004  GERD (gastroesophageal reflux disease)   Hiatal hernia   High cholesterol   Hyperglycemia 11/18/2014  Hypertension   Insomnia, unspecified 10/18/2011  Left inguinal hernia 04/24/2014  Nocturia 09/19/2008  Osteoporosis, senile 11/18/2014  Other abnormal blood chemistry 04/03/2007  Pain in joint, pelvic region and thigh 11/12/2011  Pain in joint, shoulder region 08/15/2006  Pain in limb 08/26/2005  Palpitations 09/29/2009  PAOD (peripheral arterial occlusive disease) 11/18/2014  Right leg; diminished popliteal, dorsalis pedis, and posterior tibial artery pulsations  Postmenopausal atrophic  vaginitis 12/14/2004  Scoliosis   Senile osteoporosis 12/14/2004  Tension headache 10/04/2005  Unspecified cataract 11/26/2004  Unspecified constipation 01/21/2005  Unspecified urinary incontinence 11/26/2004  Vaginal stenosis 11/18/2014  Tight band about 1/2 inches into the vagina which will not allow passage of my index finger.  Past Surgical History: Past Surgical History: Procedure Laterality Date  ABDOMINAL HYSTERECTOMY  1969  Dr.Braun   bilateral eyelid surgery  2005  Dr.Scott  BILATERAL OOPHORECTOMY  2002  BREAST SURGERY    benign tumor removal, left breast 1969  CATARACT EXTRACTION    Right  CHOLECYSTECTOMY  1990  Dr.Moore   COLONOSCOPY  3/18/20008  inflammation, diverticular associated colitis -Dr. Teressa  COSMETIC SURGERY    ESOPHAGOGASTRODUODENOSCOPY N/A 01/24/2014  Procedure: ESOPHAGOGASTRODUODENOSCOPY (EGD);  Surgeon: Norleen LOISE Kiang, MD;  Location: [facility] ENDOSCOPY;  Service: Endoscopy;  Laterality: N/A;  ESOPHAGOGASTRODUODENOSCOPY ENDOSCOPY  04/04/14  Dr. Teressa  EYE SURGERY    FOREIGN BODY REMOVAL N/A 01/24/2014  Procedure: FOREIGN BODY REMOVAL;  Surgeon: Norleen LOISE Kiang, MD;  Location: [facility] ENDOSCOPY;  Service: Endoscopy;  Laterality: N/A;  HIP PINNING,CANNULATED  11/07/2011  Procedure: CANNULATED HIP PINNING;  Surgeon: Lynwood FORBES Better, MD;  Location: [facility] ORS;  Service: Orthopedics;  Laterality: Left;  ORIF FEMORAL NECK FRACTURE W/ [facility]  11/07/2011  Dr.Nitka   TONSILLECTOMY    Dr.Joe Claudene  TUBAL LIGATION    VAGINAL PROLAPSE REPAIR  2002  Dr.Horback  Damien Blumenthal, M.A., CCC-SLP Speech Language Pathology, Acute Rehabilitation Services Secure Chat preferred [phone] 05/28/2024, 11:22 AM  CT CERVICAL SPINE WO CONTRAST Result Date: 05/26/2024 EXAM: CT CERVICAL SPINE WITHOUT CONTRAST 05/26/2024 07:11:31 PM TECHNIQUE: CT of the cervical spine was performed without the administration of intravenous contrast. Multiplanar reformatted images are provided for review. Automated exposure control, iterative  reconstruction, and/or weight based adjustment of the mA/kV was utilized to reduce the radiation dose to as low as reasonably achievable. COMPARISON: None available. CLINICAL HISTORY: Polytrauma, blunt. FINDINGS: BONES AND ALIGNMENT: 3 mm anterolisthesis C7-T1, likely degenerative in nature. Degenerative ankylosis C4-C5. Ankylosis of the facet joints of C3-C5 bilaterally. No acute fracture or traumatic listhesis. Motion degraded examination. DEGENERATIVE CHANGES: Advanced degenerative disc disease throughout the cervical spine with disc space narrowing and endplate remodeling. 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 Multilevel uncovertebral and facet arthrosis results in multilevel moderate-to-severe neuroforaminal narrowing, most severe at C5-C6 and C6-C7. SOFT TISSUES: No prevertebral soft tissue swelling. IMPRESSION: 1. No acute fracture or traumatic listhesis. 2. Motion degraded examination. 3. 3 mm anterolisthesis at C7-T1, likely degenerative. 4. Advanced degenerative disc disease throughout the cervical spine with disc space narrowing and endplate remodeling. 5. Ankylosis of the facet joints of C3-C5 bilaterally. 6. Degenerative ankylosis at C4-5. 7. Moderate central canal stenosis at C4-5 secondary to posterior disc osteophyte complex and congenital narrowing of the pedicles. 8. Multilevel moderate-to-severe neuroforaminal narrowing, most severe at C5-6 and C6-7. Electronically signed by: Dorethia Molt MD MD 05/26/2024 07:50 PM EST RP Workstation: [id number]   CT CHEST ABDOMEN PELVIS WO CONTRAST Result Date: 05/26/2024 EXAM: CT CHEST, ABDOMEN AND PELVIS WITHOUT CONTRAST 05/26/2024 07:11:31 PM TECHNIQUE: CT of the chest, abdomen and pelvis was performed without the administration of intravenous contrast. Multiplanar reformatted images are provided for review. Automated exposure control, iterative reconstruction,  and/or weight based adjustment of the mA/kV was utilized to reduce the radiation dose to as low as reasonably achievable. COMPARISON: 04/20/2014 CLINICAL HISTORY: Polytrauma, blunt. Multiple unwitnessed  fall, bruising, altered mental status. FINDINGS: CHEST: MEDIASTINUM AND LYMPH NODES: Heart and pericardium are unremarkable. Extensive multivessel coronary artery calcification. Extensive calcification of the aortic valve leaflets. The central pulmonary arteries are enlarged in keeping with changes of pulmonary arterial hypertension. Moderate atherosclerotic calcification within the thoracic aorta. 22 mm nodule within the left thyroid  lobe, not well assessed this examination. No follow-up imaging recommended unless clinically indicated. The central airways are clear. No mediastinal, hilar or axillary lymphadenopathy. LUNGS AND PLEURA: Large hiatal hernia, and large since prior examination, with the entire stomach, distal pancreas and the distal transverse and proximal descending colon within the thoracic compartment. 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 No focal consolidation or pulmonary edema. No pleural effusion. No pneumothorax. ABDOMEN AND PELVIS: LIVER: Unremarkable. GALLBLADDER AND BILE DUCTS: Status post cholecystectomy. No biliary ductal dilatation. SPLEEN: No acute abnormality. PANCREAS: No acute abnormality. ADRENAL GLANDS: No acute abnormality. KIDNEYS, URETERS AND BLADDER: Multiple nonobstructing calculi are seen within the right renal pelvis measuring 4 and 10 mm. There is superimposed mild asymmetric right perinephric and peripelvic stranding. Perinephric and peripelvic stranding from the right may reflect residual of a recently passed renal calculus. Alternatively, inflammatory condition such as an ascending urinary tract infection could appear similarly. Correlation with urinalysis and urine culture may be helpful for further management. No hydronephrosis. The left kidney is  unremarkable; no intrarenal or ureteral calculi are identified and there is no hydronephrosis. Several granular dependently layering calcifications are seen within the bladder lumen measuring 2-3 mm in size. The bladder is partially decompressed. There is, however, circumferential bladder wall thickening noted which may reflect changes of underlying cystitis or chronic bladder outlet obstruction. GI AND BOWEL: Severe descending and sigmoid colonic diverticulosis without superimposed acute inflammatory change. The visualized bowel is otherwise unremarkable. No evidence of obstruction. Appendix normal. REPRODUCTIVE ORGANS: Status post hysterectomy. No acute abnormality. PERITONEUM AND RETROPERITONEUM: No ascites. No free air. VASCULATURE: Aorta is normal in caliber. Moderate aortoiliac atherosclerotic calcification. ABDOMINAL AND PELVIS LYMPH NODES: No lymphadenopathy. BONES AND SOFT TISSUES: Left hip pinning has been performed. Severe lumbar dextroscoliosis. Osseous structures are age appropriate with advanced degenerative changes seen within the thoracolumbar spine. No focal soft tissue abnormality. Had some masses. IMPRESSION: 1. Large hiatal hernia with organoaxial gastric volvulus without evidence of obstruction. 2. Multiple nonobstructing calculi in the right renal pelvis with mild asymmetric right perinephric and peripelvic stranding, which may reflect residual of a recently passed renal calculus or an ascending urinary tract infection; urinalysis and urine culture may be helpful for further management. 3. Circumferential bladder wall thickening, which may reflect cystitis or chronic bladder outlet obstruction. 4. Severe descending and sigmoid colonic diverticulosis without acute inflammatory change. 5. Enlarged central pulmonary arteries, in keeping with pulmonary arterial hypertension. 6. Extensive multivessel coronary artery calcification and extensive calcification of the aortic valve leaflets. 7. Left  thyroid  nodule measuring 22 mm, not well assessed on this examination; no follow-up imaging recommended unless clinically indicated. 8. Severe lumbar dextroscoliosis with advanced degenerative changes of the thoracolumbar spine. 9. Raf score includes aortic atherosclerosis (ICD10-I70.0). Electronically signed by: Dorethia Molt MD MD 05/26/2024 07:45 PM EST RP Workstation: [id number]   CT T-SPINE NO CHARGE Result Date: 05/26/2024 EXAM: CT THORACIC SPINE WITHOUT CONTRAST 05/26/2024 07:11:31 PM TECHNIQUE: CT of the thoracic spine was performed without the administration of intravenous contrast. Multiplanar reformatted images are provided for review. Automated exposure control, iterative reconstruction, and/or weight based adjustment of the mA/kV was utilized to reduce the radiation dose to  as low as reasonably achievable. COMPARISON: None available. CLINICAL HISTORY: FINDINGS: BONES AND ALIGNMENT: Normal vertebral body heights. 2 mm anterolisthesis T2-3, likely degenerative in nature. No acute fracture or traumatic listhesis of the thoracic spine. No suspicious bone lesion. DEGENERATIVE CHANGES: Intervertebral disc space narrowing and endplate remodeling throughout the thoracic spine in keeping with changes of moderate-to-severe degenerative disc disease. No high-grade canal stenosis. Moderate bilateral neuroforaminal narrowing at T8-9. No high-grade neural foraminal narrowing identified. SOFT TISSUES: Left lower lobe consolidation, better assessed on accompanying CT examination of the chest, abdomen, and pelvis. Please refer to that report for further details. IMPRESSION: 1. No acute fracture or traumatic listhesis of the thoracic spine. 2. Left lower lobe consolidation. 3. Moderate-to-severe degenerative disc disease throughout the thoracic spine, without high-grade canal stenosis. 4. Moderate bilateral neuroforaminal narrowing at T8-9. 5. Minimal anterolisthesis at T2-3, likely degenerative. Electronically signed  by: Dorethia Molt MD MD 05/26/2024 07:33 PM EST RP Workstation: [id number]   CT L-SPINE NO CHARGE Result Date: 05/26/2024 EXAM: CT OF THE LUMBAR SPINE WITHOUT CONTRAST 05/26/2024 07:11:31 PM TECHNIQUE: CT of the lumbar spine was performed without the administration of intravenous contrast. Multiplanar reformatted images are provided for review. Automated exposure control, iterative reconstruction, and/or weight based adjustment of the mA/kV was utilized to reduce the radiation dose to as low as reasonably achievable. COMPARISON: None available. CLINICAL HISTORY: FINDINGS: BONES AND ALIGNMENT: Normal vertebral body heights. No acute fracture or suspicious bone lesion. No traumatic listhesis. Severe lumbar dextroscoliosis, apex right at L3. Mild loss of the normal lumbar lordosis. There is degenerative ankylosis of the L2 and L3 vertebral bodies along the left lateral aspect. DEGENERATIVE CHANGES: There is diffuse intervertebral disc space narrowing, endplate remodeling, and vacuum disc phenomena throughout the lumbar spine in keeping with changes of severe degenerative disc disease. Multifactorial moderate canal stenosis at L3-4 and L4-5. Severe right neuroforaminal narrowing at L4-5 and L5-S1 secondary to disc height loss and osteophytic ridging and facet arthrosis. Moderate-to-severe left neuroforaminal narrowing at L3-4 and L5-S1. SOFT TISSUES: Multiple nonobstructing calculi within the right kidney measuring up to 11 mm. Moderate aortoiliac atherosclerotic calcification. Severe sigmoid diverticulosis noted. IMPRESSION: 1. No acute fracture or traumatic listhesis. 2. Severe lumbar dextroscoliosis, apex right at L3, with mild loss of the normal lumbar lordosis. 3. Severe degenerative disc disease with diffuse intervertebral disc space narrowing, endplate remodeling, and vacuum disc phenomena throughout the lumbar spine, with degenerative ankylosis of the L2 and L3 vertebral bodies along the left lateral aspect.  4. Multifactorial moderate canal stenosis at L3-4 and L4-5. 5. Severe right neuroforaminal narrowing at L4-5 and L5-S1, with moderate-to-severe left neuroforaminal narrowing at L3-4 and L5-S1. 6. Multiple nonobstructing right renal calculi measuring up to 11 mm. 7. Moderate aortoiliac atherosclerotic calcification. 8. Severe sigmoid diverticulosis. Electronically signed by: Dorethia Molt MD MD 05/26/2024 07:30 PM EST RP Workstation: [id number]   CT HEAD WO CONTRAST Result Date: 05/26/2024 EXAM: CT HEAD WITHOUT CONTRAST 05/26/2024 07:11:31 PM TECHNIQUE: CT of the head was performed without the administration of intravenous contrast. Automated exposure control, iterative reconstruction, and/or weight based adjustment of the mA/kV was utilized to reduce the radiation dose to as low as reasonably achievable. COMPARISON: None available. CLINICAL HISTORY: Head trauma, moderate-severe. FINDINGS: BRAIN AND VENTRICLES: No acute hemorrhage. No evidence of acute infarct. No hydrocephalus. No extra-axial collection. No mass effect or midline shift. Cerebral volume loss. Moderate patchy supratentorial white matter hypodensities. ORBITS: No acute abnormality. SINUSES: Moderate mucosal thickening in left ethmoid sinus. SOFT TISSUES AND SKULL:  No acute soft tissue abnormality. No skull fracture. VASCULATURE: Atherosclerosis. LIMITATIONS/ARTIFACTS: Motion artifact. IMPRESSION: 1. No acute intracranial abnormality. 2. Chronic cerebral volume loss and moderate chronic supratentorial white matter change. 3. Atherosclerosis. Electronically signed by: Dorethia Molt MD MD 05/26/2024 07:17 PM EST RP Workstation: [id number]    Assessment/Plan Acute cystitis with hematuria Hospitalized 03/26/2025 to 03/30/2025 for acute cystitis with hematuria-fully treated in hospital-complete 2 more days of cefadroxil  in SNF  OAB (overactive bladder) Chronic,  more at night.   Hyperlipidemia LDL goal <100 takes Pravastatin . LDL 76  07/31/23 Update Hgb A1c, lipid panel.   GAD (generalized anxiety disorder)   Anxiety seen by Psych, off Clonazepam /Lorazepam , on Hydroxyzine .              Depression: takes Mirtazapine , Lexapro , Wellbutrin , hydroxyzine , off clonazepam /Lorazepam , TSH 2.22 07/31/23  Chronic anemia Hgb 9.7 05/29/24 TSH, CBC/diff, Vit B12, Iron, ferritin next lab.   Hypertension blood pressure is controlled, on Nifedipine , ASA, Bun/creat 26/0.78 05/29/24  Chronic bilateral low back pain without sciatica Lower back pain, taking Tylenol ,  ambulates with walker.   Constipation on Senokot, MiraLax , lubiprostone   GERD (gastroesophageal reflux disease) stable, on Pantoprazole     Family/ staff Communication: plan of care reviewed with the patient and charge nurse.   Labs/tests ordered:  TSH, CBC/diff, Vit B12, Iron, ferritin, lipids, Hgb a1c       [1] Allergies Allergen Reactions   Brexpiprazole Swelling   Lamictal [Lamotrigine] Swelling    Lip swelling   Penicillins Other (See Comments)    CHILDHOOD REACTION   Abilify [Aripiprazole] Rash  "

## 2024-05-31 NOTE — Assessment & Plan Note (Addendum)
 takes Pravastatin . LDL 76 07/31/23 Update Hgb J8r, lipid panel.

## 2024-05-31 NOTE — Assessment & Plan Note (Signed)
"    Anxiety seen by Psych, off Clonazepam /Lorazepam , on Hydroxyzine .              Depression: takes Mirtazapine , Lexapro , Wellbutrin , hydroxyzine , off clonazepam /Lorazepam , TSH 2.22 07/31/23 "

## 2024-05-31 NOTE — Assessment & Plan Note (Signed)
 Chronic,  more at night.

## 2024-05-31 NOTE — Assessment & Plan Note (Signed)
stable, on Pantoprazole  

## 2024-05-31 NOTE — Assessment & Plan Note (Signed)
 on Senokot, MiraLax , lubiprostone 

## 2024-05-31 NOTE — Assessment & Plan Note (Signed)
 Lower back pain, taking Tylenol ,  ambulates with walker.

## 2024-05-31 NOTE — Assessment & Plan Note (Signed)
 blood pressure is controlled, on Nifedipine , ASA, Bun/creat 26/0.78 05/29/24

## 2024-06-03 ENCOUNTER — Other Ambulatory Visit: Payer: Self-pay | Admitting: Nurse Practitioner

## 2024-06-03 MED ORDER — LORAZEPAM 0.5 MG PO TABS: 0.5000 mg | ORAL_TABLET | Freq: Four times a day (QID) | ORAL | 1 refills | Status: AC

## 2024-06-04 ENCOUNTER — Encounter: Payer: Self-pay | Admitting: Nurse Practitioner

## 2024-06-04 LAB — HEMOGLOBIN A1C: Hemoglobin A1C: 5.9

## 2024-06-04 LAB — LIPID PANEL
Cholesterol: 136 (ref 0–200)
HDL: 32 — AB (ref 35–70)
LDL Cholesterol: 82
Triglycerides: 128 (ref 40–160)

## 2024-06-04 LAB — CBC AND DIFFERENTIAL
HCT: 32 — AB (ref 36–46)
Hemoglobin: 10 — AB (ref 12.0–16.0)
WBC: 6.1

## 2024-06-04 LAB — IRON,TIBC AND FERRITIN PANEL: Iron: 45

## 2024-06-04 LAB — CBC: RBC: 3.42 — AB (ref 3.87–5.11)

## 2024-06-04 LAB — TSH: TSH: 1.62 (ref 0.41–5.90)

## 2024-06-04 LAB — VITAMIN B12: Vitamin B-12: 756

## 2024-06-05 ENCOUNTER — Non-Acute Institutional Stay (SKILLED_NURSING_FACILITY): Payer: Self-pay | Admitting: Family Medicine

## 2024-06-05 DIAGNOSIS — R1310 Dysphagia, unspecified: Secondary | ICD-10-CM | POA: Diagnosis not present

## 2024-06-05 DIAGNOSIS — N3001 Acute cystitis with hematuria: Secondary | ICD-10-CM

## 2024-06-05 DIAGNOSIS — M150 Primary generalized (osteo)arthritis: Secondary | ICD-10-CM

## 2024-06-05 DIAGNOSIS — I10 Essential (primary) hypertension: Secondary | ICD-10-CM

## 2024-06-05 NOTE — Assessment & Plan Note (Addendum)
 Patient reports eating choked with swallowing.  I did give her a sip of water during the course of my visit and she had coughing thereafter

## 2024-06-05 NOTE — Assessment & Plan Note (Addendum)
 This has resolved but she does have a history of previous UTI

## 2024-06-05 NOTE — Assessment & Plan Note (Addendum)
 Continue nifedipine  as previous; blood pressure control

## 2024-06-05 NOTE — Progress Notes (Signed)
 " Provider:  Garnette Pinal, MD Location:      Place of Service:     PCP: [name], MD Patient Care Team: [name], MD as PCP - General (Internal Medicine) Devora, [facility] Octavia Charleston, MD as Consulting Physician (Ophthalmology) Teressa Toribio SQUIBB, MD (Inactive) as Consulting Physician (Gastroenterology) Tasia Lung, MD as Consulting Physician (Psychiatry) Lucilla Lynwood BRAVO, MD (Inactive) as Consulting Physician (Orthopedic Surgery) Gaston Hamilton, MD as Consulting Physician (Urology)  Extended Emergency Contact Information Primary Emergency Contact: Betsy, [place], [name] [place] Home Phone: [phone] Mobile Phone: [phone] Relation: Son Secondary Emergency Contact: [name],Worth Mobile Phone: [phone] Relation: Son  Code Status:  Goals of Care: Advanced Directive information    05/27/2024    3:47 PM  Advanced Directives  Type of Advance Directive Healthcare Power of Attorney  Does patient want to make changes to medical advance directive? No - Patient declined     HPI: Patient is a 89 y.o. female seen today for admission to [facility].  She was admitted to the hospital on 05/26/2024 and discharged on 05/31/2024.  Discharge diagnosis included acute cystitis with hematuria, recurrent falls, generalized weakness, physical deconditioning.  Problems which resolved during her hospitalization included acute kidney injury and acute metabolic encephalopathy. [facility] had [facility] living [facility] [place] and assisted care for some years.  Her problems included hypertension hyperlipidemia degenerative joint disease generalized anxiety disorder depression insomnia osteoporosis GERD peripheral arterial disease overactive bladder and chronic kidney disease stage II. Prior to her hospitalization she was noted to be more lethargic and disoriented this was felt to be due to the urinary tract infection.  Although  culture was equivocal she finished an empiric course of antibiotics.  Her acute kidney injury was related to poor intake and the UTI.  Her baseline creatinine was 0.9.  It peaked at 1.7 but at the time of discharge was back to baseline. She was discharged to [facility] here for physical therapy but family requested she be moved to [place]  to be closer to her son.  Past Medical History:  Diagnosis Date   Abnormal mammogram, unspecified 04/19/2011   Allergy    Anxiety    Backache, unspecified 01/21/2005   Cervicalgia 08/20/2013   Chronic kidney disease, stage II (mild) 04/03/2009   Chronic rhinitis 09/28/2010   DDD (degenerative disc disease)    Depression    Dermatophytosis of nail 01/21/2005   Diverticulosis of colon (without mention of hemorrhage) 06/17/2005   Edema 11/26/2004   GERD (gastroesophageal reflux disease)    Hiatal hernia    High cholesterol    Hyperglycemia 11/18/2014   Hypertension    Insomnia, unspecified 10/18/2011   Left inguinal hernia 04/24/2014   Nocturia 09/19/2008   Osteoporosis, senile 11/18/2014   Other abnormal blood chemistry 04/03/2007   Pain in joint, pelvic region and thigh 11/12/2011   Pain in joint, shoulder region 08/15/2006   Pain in limb 08/26/2005   Palpitations 09/29/2009   PAOD (peripheral arterial occlusive disease) 11/18/2014   Right leg; diminished popliteal, dorsalis pedis, and posterior tibial artery pulsations    Postmenopausal atrophic vaginitis 12/14/2004   Scoliosis    Senile osteoporosis 12/14/2004   Tension headache 10/04/2005   Unspecified cataract 11/26/2004   Unspecified constipation 01/21/2005   Unspecified urinary incontinence 11/26/2004   Vaginal stenosis 11/18/2014   Tight band about 1/2 inches into the vagina which will not allow  passage of my index finger.    Past Surgical History:  Procedure Laterality Date   ABDOMINAL HYSTERECTOMY  1969   Dr.Braun    bilateral eyelid surgery  2005   Dr.Scott   BILATERAL OOPHORECTOMY  2002   BREAST  SURGERY     benign tumor removal, left breast 1969   CATARACT EXTRACTION     Right   CHOLECYSTECTOMY  1990   Dr.Moore    COLONOSCOPY  3/18/20008   inflammation, diverticular associated colitis -Dr. Teressa   COSMETIC SURGERY     ESOPHAGOGASTRODUODENOSCOPY N/A 01/24/2014   Procedure: ESOPHAGOGASTRODUODENOSCOPY (EGD);  Surgeon: Norleen LOISE Kiang, MD;  Location: [facility] ENDOSCOPY;  Service: Endoscopy;  Laterality: N/A;   ESOPHAGOGASTRODUODENOSCOPY ENDOSCOPY  04/04/14   Dr. Teressa   EYE SURGERY     FOREIGN BODY REMOVAL N/A 01/24/2014   Procedure: FOREIGN BODY REMOVAL;  Surgeon: Norleen LOISE Kiang, MD;  Location: [facility] ENDOSCOPY;  Service: Endoscopy;  Laterality: N/A;   HIP PINNING,CANNULATED  11/07/2011   Procedure: CANNULATED HIP PINNING;  Surgeon: Lynwood FORBES Better, MD;  Location: [facility] ORS;  Service: Orthopedics;  Laterality: Left;   ORIF FEMORAL NECK FRACTURE W/ [facility]  11/07/2011   Dr.Nitka    TONSILLECTOMY     Dr.Joe Claudene   TUBAL LIGATION     VAGINAL PROLAPSE REPAIR  2002   Dr.Horback     reports that she has never smoked. She has never used smokeless tobacco. She reports that she does not currently use alcohol . She reports that she does not use drugs. Social History   Socioeconomic History   Marital status: Widowed    Spouse name: Not on file   Number of children: Not on file   Years of education: Not on file   Highest education level: Not on file  Occupational History   Occupation: retired Midwife: RETIRED  Tobacco Use   Smoking status: Never   Smokeless tobacco: Never  Vaping Use   Vaping status: Never Used  Substance and Sexual Activity   Alcohol  use: Not Currently    Alcohol /week: 0.0 standard drinks of alcohol     Comment: one glass of wine in evening   Drug use: No   Sexual activity: Never  Other Topics Concern   Not on file  Social History Narrative   Lives at [facility] since 04/14/2005   Widowed (husband expired 2014)   Has Living Will, POA, MOST   Exercise:  water aerobic  5 times a week   Walks with walker   Never smoked   Drinks moderate amount of caffeinate beverages daily   Alcohol  none      Social Drivers of Health   Tobacco Use: Low Risk (06/04/2024)   Patient History    Smoking Tobacco Use: Never    Smokeless Tobacco Use: Never    Passive Exposure: Not on file  Financial Resource Strain: Low Risk (08/11/2023)   Overall Financial Resource Strain (CARDIA)    Difficulty of Paying Living Expenses: Not hard at all  Food Insecurity: No Food Insecurity (05/27/2024)   Epic    Worried About Programme Researcher, Broadcasting/film/video in the Last Year: Never true    Ran Out of Food in the Last Year: Never true  Transportation Needs: No Transportation Needs (05/27/2024)   Epic    Lack of Transportation (Medical): No    Lack of Transportation (Non-Medical): No  Physical Activity: Insufficiently Active (08/11/2023)   Exercise Vital Sign    Days of Exercise  per Week: 3 days    Minutes of Exercise per Session: 10 min  Stress: Stress Concern Present (08/11/2023)   [employer] of Occupational Health - Occupational Stress Questionnaire    Feeling of Stress : Very much  Social Connections: Socially Isolated (08/11/2023)   Social Connection and Isolation Panel    Frequency of Communication with Friends and Family: Three times a week    Frequency of Social Gatherings with Friends and Family: Three times a week    Attends Religious Services: Never    Active Member of Clubs or Organizations: No    Attends Banker Meetings: Never    Marital Status: Widowed  Intimate Partner Violence: Not At Risk (08/11/2023)   Humiliation, Afraid, Rape, and Kick questionnaire    Fear of Current or Ex-Partner: No    Emotionally Abused: No    Physically Abused: No    Sexually Abused: No  Depression (PHQ2-9): High Risk (01/29/2024)   Depression (PHQ2-9)    PHQ-2 Score: 16  Alcohol  Screen: Low Risk (08/11/2023)   Alcohol  Screen    Last Alcohol  Screening Score (AUDIT): 0   Housing: Low Risk (05/27/2024)   Epic    Unable to Pay for Housing in the Last Year: No    Number of Times Moved in the Last Year: 0    Homeless in the Last Year: No  Utilities: Not At Risk (08/11/2023)   AHC Utilities    Threatened with loss of utilities: No  Health Literacy: Not on file    Functional Status Survey:    Family History  Problem Relation Age of Onset   Heart disease Mother    Stroke Mother    Emphysema Father    Arthritis Father    Alcohol  abuse Brother    Heart disease Son    Leukemia Son    Esophageal cancer Neg Hx    Stomach cancer Neg Hx    Colon cancer Neg Hx     Health Maintenance  Topic Date Due   COVID-19 Vaccine (8 - 2025-26 season) 01/15/2024   Medicare Annual Wellness (AWV)  08/10/2024   Pneumococcal Vaccine: 50+ Years  Completed   Influenza Vaccine  Completed   Bone Density Scan  Completed   Zoster Vaccines- Shingrix  Completed   Meningococcal B Vaccine  Aged Out   DTaP/Tdap/Td  Discontinued   Mammogram  Discontinued    Allergies[1]  Outpatient Encounter Medications as of 06/05/2024  Medication Sig   acetaminophen  (TYLENOL ) 325 MG tablet Take 650 mg by mouth every 4 (four) hours as needed for fever.   Acetylcysteine (NAC) 500 MG CAPS Take 600 mg by mouth daily.   antiseptic oral rinse (BIOTENE) LIQD 10 mLs by Mouth Rinse route as needed for dry mouth. Resident may self administer and keep in room   aspirin  81 MG chewable tablet Chew 81 mg by mouth daily.   bismuth subsalicylate (PEPTO BISMOL) 262 MG/15ML suspension Take 30 mLs by mouth daily as needed for diarrhea or loose stools.   buPROPion  (WELLBUTRIN  XL) 150 MG 24 hr tablet Take 150 mg by mouth daily.   Calcium  Carb-Cholecalciferol  600-800 MG-UNIT TABS Take 1 tablet by mouth 2 (two) times daily.   cholecalciferol  (VITAMIN D ) 1000 UNITS tablet Take 1,000 Units by mouth daily.   docusate sodium  (COLACE) 100 MG capsule Take 1 capsule (100 mg total) by mouth every other day.    escitalopram  (LEXAPRO ) 20 MG tablet Take 1 tablet (20 mg total) by mouth daily.  Glucosamine HCl 500 MG TABS Take 4 capsules by mouth 2 (two) times daily.   hydrocortisone  1 % ointment Apply 1 Application topically 2 (two) times daily. Apply to perineum   hydrocortisone  2.5 % cream Apply 1 Application topically See admin instructions. Apply to anal topically as needed for irriation as needed twice daily   hydrOXYzine  (ATARAX ) 10 MG tablet Take 1 tablet (10 mg total) by mouth 2 (two) times daily.   hypromellose (GENTEAL SEVERE) 0.3 % GEL ophthalmic ointment Place 1 Application into both eyes as needed for dry eyes.   LORazepam  (ATIVAN ) 0.5 MG tablet Take 1 tablet (0.5 mg total) by mouth every 6 (six) hours.   lubiprostone  (AMITIZA ) 8 MCG capsule Take 8 mcg by mouth 2 (two) times daily with a meal.   Menthol , Topical Analgesic, 4 % GEL Apply 1 application  topically as needed. Resident may self administer and keep in room   mirtazapine  (REMERON ) 45 MG tablet Take 1 tablet (45 mg total) by mouth at bedtime.   Multiple Vitamins-Minerals (PRESERVISION AREDS 2+MULTI VIT PO) Take 1 tablet by mouth 2 (two) times daily.   NIFEdipine  (ADALAT  CC) 60 MG 24 hr tablet Take 60 mg by mouth daily.   pantoprazole  (PROTONIX ) 40 MG tablet One daily to reduce stomach acid (Patient taking differently: Take 40 mg by mouth daily. One daily to reduce stomach acid)   polyethylene glycol (MIRALAX  / GLYCOLAX ) 17 g packet Take 17 g by mouth daily. Mix 17g  with 8 oz. of fluid daily for constipation   Povidone (IVIZIA DRY EYES OP) Place 2 drops into both eyes 4 (four) times daily.   pravastatin  (PRAVACHOL ) 10 MG tablet Take 10 mg by mouth daily.   No facility-administered encounter medications on file as of 06/05/2024.    Review of Systems  Constitutional:  Positive for activity change and fatigue.  HENT: Negative.    Respiratory: Negative.    Cardiovascular: Negative.   Gastrointestinal: Negative.   Genitourinary:   Positive for dysuria.  Musculoskeletal:  Positive for arthralgias and gait problem.  Skin: Negative.   Neurological:  Positive for weakness and numbness.  Psychiatric/Behavioral:  The patient is nervous/anxious.   All other systems reviewed and are negative.   There were no vitals filed for this visit. There is no height or weight on file to calculate BMI. Physical Exam Vitals and nursing note reviewed.  Constitutional:      Appearance: Normal appearance. She is ill-appearing.  HENT:     Head: Normocephalic.     Mouth/Throat:     Mouth: Mucous membranes are moist.     Pharynx: Oropharynx is clear.  Eyes:     Pupils: Pupils are equal, round, and reactive to light.  Cardiovascular:     Rate and Rhythm: Normal rate.  Neurological:     Mental Status: She is alert.     Labs reviewed: Basic Metabolic Panel: Recent Labs    05/27/24 0237 05/28/24 0506 05/29/24 0235  NA 134* 136 135  K 4.3 3.8 3.6  CL 101 101 102  CO2 23 25 24   GLUCOSE 110* 90 112*  BUN 33* 28* 26*  CREATININE 1.19* 0.96 0.78  CALCIUM  9.5 9.2 9.1  MG  --   --  2.1   Liver Function Tests: Recent Labs    07/31/23 0000 05/26/24 1649  AST 14 303*  ALT 8 114*  ALKPHOS 67 96  BILITOT  --  0.5  PROT  --  7.3  ALBUMIN 3.9 3.7  No results for input(s): LIPASE, AMYLASE in the last 8760 hours. No results for input(s): AMMONIA in the last 8760 hours. CBC: Recent Labs    05/27/24 0237 05/28/24 0506 05/29/24 0235  WBC 14.0* 9.0 6.4  NEUTROABS  --   --  5.0  HGB 10.5* 10.1* 9.7*  HCT 32.8* 30.8* 28.9*  MCV 93.4 91.7 90.6  PLT 126* 107* 121*   Cardiac Enzymes: No results for input(s): CKTOTAL, CKMB, CKMBINDEX, TROPONINI in the last 8760 hours. BNP: Invalid input(s): POCBNP Lab Results  Component Value Date   HGBA1C 5.5 09/28/2018   Lab Results  Component Value Date   TSH 2.22 07/31/2023   No results found for: VITAMINB12 No results found for: FOLATE Lab Results   Component Value Date   IRON 80 09/28/2018   FERRITIN 107 09/28/2018    Imaging and Procedures obtained prior to SNF admission: CT CERVICAL SPINE WO CONTRAST Result Date: 05/26/2024 EXAM: CT CERVICAL SPINE WITHOUT CONTRAST 05/26/2024 07:11:31 PM TECHNIQUE: CT of the cervical spine was performed without the administration of intravenous contrast. Multiplanar reformatted images are provided for review. Automated exposure control, iterative reconstruction, and/or weight based adjustment of the mA/kV was utilized to reduce the radiation dose to as low as reasonably achievable. COMPARISON: None available. CLINICAL HISTORY: Polytrauma, blunt. FINDINGS: BONES AND ALIGNMENT: 3 mm anterolisthesis C7-T1, likely degenerative in nature. Degenerative ankylosis C4-C5. Ankylosis of the facet joints of C3-C5 bilaterally. No acute fracture or traumatic listhesis. Motion degraded examination. DEGENERATIVE CHANGES: Advanced degenerative disc disease throughout the cervical spine with disc space narrowing and endplate remodeling. 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 Multilevel uncovertebral and facet arthrosis results in multilevel moderate-to-severe neuroforaminal narrowing, most severe at C5-C6 and C6-C7. SOFT TISSUES: No prevertebral soft tissue swelling. IMPRESSION: 1. No acute fracture or traumatic listhesis. 2. Motion degraded examination. 3. 3 mm anterolisthesis at C7-T1, likely degenerative. 4. Advanced degenerative disc disease throughout the cervical spine with disc space narrowing and endplate remodeling. 5. Ankylosis of the facet joints of C3-C5 bilaterally. 6. Degenerative ankylosis at C4-5. 7. Moderate central canal stenosis at C4-5 secondary to posterior disc osteophyte complex and congenital narrowing of the pedicles. 8. Multilevel moderate-to-severe neuroforaminal narrowing, most severe at C5-6 and C6-7. Electronically  signed by: Dorethia Molt MD MD 05/26/2024 07:50 PM EST RP Workstation: [id number]   CT CHEST ABDOMEN PELVIS WO CONTRAST Result Date: 05/26/2024 EXAM: CT CHEST, ABDOMEN AND PELVIS WITHOUT CONTRAST 05/26/2024 07:11:31 PM TECHNIQUE: CT of the chest, abdomen and pelvis was performed without the administration of intravenous contrast. Multiplanar reformatted images are provided for review. Automated exposure control, iterative reconstruction, and/or weight based adjustment of the mA/kV was utilized to reduce the radiation dose to as low as reasonably achievable. COMPARISON: 04/20/2014 CLINICAL HISTORY: Polytrauma, blunt. Multiple unwitnessed fall, bruising, altered mental status. FINDINGS: CHEST: MEDIASTINUM AND LYMPH NODES: Heart and pericardium are unremarkable. Extensive multivessel coronary artery calcification. Extensive calcification of the aortic valve leaflets. The central pulmonary arteries are enlarged in keeping with changes of pulmonary arterial hypertension. Moderate atherosclerotic calcification within the thoracic aorta. 22 mm nodule within the left thyroid  lobe, not well assessed this examination. No follow-up imaging recommended unless clinically indicated. The central airways are clear. No mediastinal, hilar or axillary lymphadenopathy. LUNGS AND PLEURA: Large hiatal hernia, and large since prior examination, with the entire stomach, distal pancreas and the distal transverse and proximal descending colon within the thoracic compartment. 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 No focal consolidation or pulmonary edema. No pleural effusion. No pneumothorax. ABDOMEN AND PELVIS: LIVER: Unremarkable. GALLBLADDER AND BILE DUCTS: Status post cholecystectomy. No biliary ductal dilatation. SPLEEN: No acute abnormality. PANCREAS: No acute abnormality. ADRENAL GLANDS: No acute abnormality. KIDNEYS, URETERS AND BLADDER: Multiple nonobstructing calculi are seen within the right renal pelvis  measuring 4 and 10 mm. There is superimposed mild asymmetric right perinephric and peripelvic stranding. Perinephric and peripelvic stranding from the right may reflect residual of a recently passed renal calculus. Alternatively, inflammatory condition such as an ascending urinary tract infection could appear similarly. Correlation with urinalysis and urine culture may be helpful for further management. No hydronephrosis. The left kidney is unremarkable; no intrarenal or ureteral calculi are identified and there is no hydronephrosis. Several granular dependently layering calcifications are seen within the bladder lumen measuring 2-3 mm in size. The bladder is partially decompressed. There is, however, circumferential bladder wall thickening noted which may reflect changes of underlying cystitis or chronic bladder outlet obstruction. GI AND BOWEL: Severe descending and sigmoid colonic diverticulosis without superimposed acute inflammatory change. The visualized bowel is otherwise unremarkable. No evidence of obstruction. Appendix normal. REPRODUCTIVE ORGANS: Status post hysterectomy. No acute abnormality. PERITONEUM AND RETROPERITONEUM: No ascites. No free air. VASCULATURE: Aorta is normal in caliber. Moderate aortoiliac atherosclerotic calcification. ABDOMINAL AND PELVIS LYMPH NODES: No lymphadenopathy. BONES AND SOFT TISSUES: Left hip pinning has been performed. Severe lumbar dextroscoliosis. Osseous structures are age appropriate with advanced degenerative changes seen within the thoracolumbar spine. No focal soft tissue abnormality. Had some masses. IMPRESSION: 1. Large hiatal hernia with organoaxial gastric volvulus without evidence of obstruction. 2. Multiple nonobstructing calculi in the right renal pelvis with mild asymmetric right perinephric and peripelvic stranding, which may reflect residual of a recently passed renal calculus or an ascending urinary tract infection; urinalysis and urine culture may be  helpful for further management. 3. Circumferential bladder wall thickening, which may reflect cystitis or chronic bladder outlet obstruction. 4. Severe descending and sigmoid colonic diverticulosis without acute inflammatory change. 5. Enlarged central pulmonary arteries, in keeping with pulmonary arterial hypertension. 6. Extensive multivessel coronary artery calcification and extensive calcification of the aortic valve leaflets. 7. Left thyroid  nodule measuring 22 mm, not well assessed on this examination; no follow-up imaging recommended unless clinically indicated. 8. Severe lumbar dextroscoliosis with advanced degenerative changes of the thoracolumbar spine. 9. Raf score includes aortic atherosclerosis (ICD10-I70.0). Electronically signed by: Dorethia Molt MD MD 05/26/2024 07:45 PM EST RP Workstation: [id number]   CT T-SPINE NO CHARGE Result Date: 05/26/2024 EXAM: CT THORACIC SPINE WITHOUT CONTRAST 05/26/2024 07:11:31 PM TECHNIQUE: CT of the thoracic spine was performed without the administration of intravenous contrast. Multiplanar reformatted images are provided for review. Automated exposure control, iterative reconstruction, and/or weight based adjustment of the mA/kV was utilized to reduce the radiation dose to as low as reasonably achievable. COMPARISON: None available. CLINICAL HISTORY: FINDINGS: BONES AND ALIGNMENT: Normal vertebral body heights. 2 mm anterolisthesis T2-3, likely degenerative in nature. No acute fracture or traumatic listhesis of the thoracic spine. No suspicious bone lesion. DEGENERATIVE CHANGES: Intervertebral disc space narrowing and endplate remodeling throughout the thoracic spine in keeping with changes of moderate-to-severe degenerative disc disease. No high-grade canal stenosis. Moderate bilateral neuroforaminal narrowing at T8-9. No high-grade neural foraminal narrowing identified. SOFT TISSUES: Left lower lobe consolidation, better assessed on accompanying CT examination of  the chest, abdomen, and pelvis. Please refer to that report for further details. IMPRESSION: 1. No acute fracture or traumatic listhesis of  the thoracic spine. 2. Left lower lobe consolidation. 3. Moderate-to-severe degenerative disc disease throughout the thoracic spine, without high-grade canal stenosis. 4. Moderate bilateral neuroforaminal narrowing at T8-9. 5. Minimal anterolisthesis at T2-3, likely degenerative. Electronically signed by: Dorethia Molt MD MD 05/26/2024 07:33 PM EST RP Workstation: [id number]   CT L-SPINE NO CHARGE Result Date: 05/26/2024 EXAM: CT OF THE LUMBAR SPINE WITHOUT CONTRAST 05/26/2024 07:11:31 PM TECHNIQUE: CT of the lumbar spine was performed without the administration of intravenous contrast. Multiplanar reformatted images are provided for review. Automated exposure control, iterative reconstruction, and/or weight based adjustment of the mA/kV was utilized to reduce the radiation dose to as low as reasonably achievable. COMPARISON: None available. CLINICAL HISTORY: FINDINGS: BONES AND ALIGNMENT: Normal vertebral body heights. No acute fracture or suspicious bone lesion. No traumatic listhesis. Severe lumbar dextroscoliosis, apex right at L3. Mild loss of the normal lumbar lordosis. There is degenerative ankylosis of the L2 and L3 vertebral bodies along the left lateral aspect. DEGENERATIVE CHANGES: There is diffuse intervertebral disc space narrowing, endplate remodeling, and vacuum disc phenomena throughout the lumbar spine in keeping with changes of severe degenerative disc disease. Multifactorial moderate canal stenosis at L3-4 and L4-5. Severe right neuroforaminal narrowing at L4-5 and L5-S1 secondary to disc height loss and osteophytic ridging and facet arthrosis. Moderate-to-severe left neuroforaminal narrowing at L3-4 and L5-S1. SOFT TISSUES: Multiple nonobstructing calculi within the right kidney measuring up to 11 mm. Moderate aortoiliac atherosclerotic calcification.  Severe sigmoid diverticulosis noted. IMPRESSION: 1. No acute fracture or traumatic listhesis. 2. Severe lumbar dextroscoliosis, apex right at L3, with mild loss of the normal lumbar lordosis. 3. Severe degenerative disc disease with diffuse intervertebral disc space narrowing, endplate remodeling, and vacuum disc phenomena throughout the lumbar spine, with degenerative ankylosis of the L2 and L3 vertebral bodies along the left lateral aspect. 4. Multifactorial moderate canal stenosis at L3-4 and L4-5. 5. Severe right neuroforaminal narrowing at L4-5 and L5-S1, with moderate-to-severe left neuroforaminal narrowing at L3-4 and L5-S1. 6. Multiple nonobstructing right renal calculi measuring up to 11 mm. 7. Moderate aortoiliac atherosclerotic calcification. 8. Severe sigmoid diverticulosis. Electronically signed by: Dorethia Molt MD MD 05/26/2024 07:30 PM EST RP Workstation: [id number]   CT HEAD WO CONTRAST Result Date: 05/26/2024 EXAM: CT HEAD WITHOUT CONTRAST 05/26/2024 07:11:31 PM TECHNIQUE: CT of the head was performed without the administration of intravenous contrast. Automated exposure control, iterative reconstruction, and/or weight based adjustment of the mA/kV was utilized to reduce the radiation dose to as low as reasonably achievable. COMPARISON: None available. CLINICAL HISTORY: Head trauma, moderate-severe. FINDINGS: BRAIN AND VENTRICLES: No acute hemorrhage. No evidence of acute infarct. No hydrocephalus. No extra-axial collection. No mass effect or midline shift. Cerebral volume loss. Moderate patchy supratentorial white matter hypodensities. ORBITS: No acute abnormality. SINUSES: Moderate mucosal thickening in left ethmoid sinus. SOFT TISSUES AND SKULL: No acute soft tissue abnormality. No skull fracture. VASCULATURE: Atherosclerosis. LIMITATIONS/ARTIFACTS: Motion artifact. IMPRESSION: 1. No acute intracranial abnormality. 2. Chronic cerebral volume loss and moderate chronic supratentorial white  matter change. 3. Atherosclerosis. Electronically signed by: Dorethia Molt MD MD 05/26/2024 07:17 PM EST RP Workstation: [id number]    Assessment/Plan Assessment & Plan Primary hypertension Continue nifedipine  as previous; blood pressure control Dysphagia, unspecified type Patient reports eating choked with swallowing.  I did give her a sip of water during the course of my visit and she had coughing thereafter Acute cystitis with hematuria This has resolved but she does have a history of previous UTI Primary osteoarthritis involving multiple joints  Ambulates with walker.  History of frequent falls Family patient desired to be transferred to facility in [place]  okayed this move and paperwork completed   Family/ staff Communication:   Labs/tests ordered:  Garnette HERO. [place], MD [facility] [address] [place], [place] [address] Office [phone]      [1]  Allergies Allergen Reactions   Brexpiprazole Swelling   Lamictal [Lamotrigine] Swelling    Lip swelling   Penicillins Other (See Comments)    CHILDHOOD REACTION   Abilify [Aripiprazole] Rash   "

## 2024-06-05 NOTE — Assessment & Plan Note (Addendum)
 Ambulates with walker.  History of frequent falls Family patient desired to be transferred to facility in [place]  okayed this move and paperwork completed

## 2024-06-07 ENCOUNTER — Non-Acute Institutional Stay (SKILLED_NURSING_FACILITY): Payer: Self-pay | Admitting: Nurse Practitioner

## 2024-06-07 ENCOUNTER — Encounter: Payer: Self-pay | Admitting: Nurse Practitioner

## 2024-06-07 DIAGNOSIS — D649 Anemia, unspecified: Secondary | ICD-10-CM

## 2024-06-07 DIAGNOSIS — E785 Hyperlipidemia, unspecified: Secondary | ICD-10-CM | POA: Diagnosis not present

## 2024-06-07 DIAGNOSIS — F411 Generalized anxiety disorder: Secondary | ICD-10-CM | POA: Diagnosis not present

## 2024-06-07 DIAGNOSIS — M150 Primary generalized (osteo)arthritis: Secondary | ICD-10-CM

## 2024-06-07 DIAGNOSIS — I10 Essential (primary) hypertension: Secondary | ICD-10-CM | POA: Diagnosis not present

## 2024-06-07 DIAGNOSIS — K219 Gastro-esophageal reflux disease without esophagitis: Secondary | ICD-10-CM

## 2024-06-07 DIAGNOSIS — K1120 Sialoadenitis, unspecified: Secondary | ICD-10-CM

## 2024-06-07 DIAGNOSIS — R7303 Prediabetes: Secondary | ICD-10-CM

## 2024-06-07 DIAGNOSIS — K5901 Slow transit constipation: Secondary | ICD-10-CM | POA: Diagnosis not present

## 2024-06-07 NOTE — Progress Notes (Signed)
 " Location:  [facility] Room Number: N040-A Place of Service:  SNF [phone]) Provider:  Beva Remund Lorenda Hark NP   Patient Care Team: [name], MD as PCP - General (Internal Medicine) Devora, [facility] Octavia Charleston, MD as Consulting Physician (Ophthalmology) Teressa Toribio SQUIBB, MD (Inactive) as Consulting Physician (Gastroenterology) Tasia Lung, MD as Consulting Physician (Psychiatry) Lucilla Lynwood BRAVO, MD (Inactive) as Consulting Physician (Orthopedic Surgery) Gaston Hamilton, MD as Consulting Physician (Urology)  Extended Emergency Contact Information Primary Emergency Contact: Cassi, Jenne [place], [name] [place] Home Phone: [phone] Mobile Phone: [phone] Relation: Son Secondary Emergency Contact: [name],Worth Mobile Phone: [phone] Relation: Son  Code Status:  DNR Goals of care: Advanced Directive information    05/27/2024    3:47 PM  Advanced Directives  Type of Advance Directive Healthcare Power of Attorney  Does patient want to make changes to medical advance directive? No - Patient declined     Chief Complaint  Patient presents with   Rash    HPI:  Pt is a 89 y.o. female seen today for an acute visit for rash under the right breast, medical aspect of R+L inner thighs.   Also noted a painful firm lump under the left mandible, smooth, no coating, slight reddened tongue.    Hospitalized 03/26/2025 to 03/30/2025 for acute cystitis with hematuria-fully treated in hospital-complete 2 more days of cefadroxil  in SNF, s/p fall, generalized weakness, resolved acute metabolic encephalopathy and AKI. Hx of vaginal bleeding/spoting on and off since the prolapsed vagina repair due to the surgical scar tissue. declined GYN f/u.              GERD, stable, on Pantoprazole              Constipation, on Senokot, MiraLax , lubiprostone              Lower back pain, taking Tylenol ,  ambulates with walker prior to  hospitalization.              HTN, blood pressure is controlled, on Nifedipine , ASA, Bun/creat 26/0.78 05/29/24             Chronic anemia, Hgb 10, Vit B12 756 06/04/24             Anxiety seen by Psych, off Clonazepam , resumed Lorazepam , on Hydroxyzine .              Depression: takes Mirtazapine , Lexapro , Wellbutrin , hydroxyzine , off clonazepam , resumed Lorazepam , TSH 1.62, Vit B12 756 06/04/24             Dizziness, on and off, chronic, seen by Neurology,  MRI brain-Patient declined it.              Hyperlipidemia, takes Pravastatin . LDL 82 06/04/24             Urinary incontinent, more at night.   Prediabetes Hgb A1c 5.9 06/04/24     Past Medical History:  Diagnosis Date   Abnormal mammogram, unspecified 04/19/2011   Allergy    Anxiety    Backache, unspecified 01/21/2005   Cervicalgia 08/20/2013   Chronic kidney disease, stage II (mild) 04/03/2009   Chronic rhinitis 09/28/2010   DDD (degenerative disc disease)    Depression    Dermatophytosis of nail 01/21/2005   Diverticulosis of colon (without mention of hemorrhage) 06/17/2005   Edema 11/26/2004   GERD (gastroesophageal reflux disease)    Hiatal hernia    High cholesterol  Hyperglycemia 11/18/2014   Hypertension    Insomnia, unspecified 10/18/2011   Left inguinal hernia 04/24/2014   Nocturia 09/19/2008   Osteoporosis, senile 11/18/2014   Other abnormal blood chemistry 04/03/2007   Pain in joint, pelvic region and thigh 11/12/2011   Pain in joint, shoulder region 08/15/2006   Pain in limb 08/26/2005   Palpitations 09/29/2009   PAOD (peripheral arterial occlusive disease) 11/18/2014   Right leg; diminished popliteal, dorsalis pedis, and posterior tibial artery pulsations    Postmenopausal atrophic vaginitis 12/14/2004   Scoliosis    Senile osteoporosis 12/14/2004   Tension headache 10/04/2005   Unspecified cataract 11/26/2004   Unspecified constipation 01/21/2005   Unspecified urinary incontinence 11/26/2004   Vaginal stenosis 11/18/2014   Tight band  about 1/2 inches into the vagina which will not allow passage of my index finger.    Past Surgical History:  Procedure Laterality Date   ABDOMINAL HYSTERECTOMY  1969   Dr.Braun    bilateral eyelid surgery  2005   Dr.Scott   BILATERAL OOPHORECTOMY  2002   BREAST SURGERY     benign tumor removal, left breast 1969   CATARACT EXTRACTION     Right   CHOLECYSTECTOMY  1990   Dr.Moore    COLONOSCOPY  3/18/20008   inflammation, diverticular associated colitis -Dr. Teressa   COSMETIC SURGERY     ESOPHAGOGASTRODUODENOSCOPY N/A 01/24/2014   Procedure: ESOPHAGOGASTRODUODENOSCOPY (EGD);  Surgeon: Norleen LOISE Kiang, MD;  Location: [facility] ENDOSCOPY;  Service: Endoscopy;  Laterality: N/A;   ESOPHAGOGASTRODUODENOSCOPY ENDOSCOPY  04/04/14   Dr. Teressa   EYE SURGERY     FOREIGN BODY REMOVAL N/A 01/24/2014   Procedure: FOREIGN BODY REMOVAL;  Surgeon: Norleen LOISE Kiang, MD;  Location: [facility] ENDOSCOPY;  Service: Endoscopy;  Laterality: N/A;   HIP PINNING,CANNULATED  11/07/2011   Procedure: CANNULATED HIP PINNING;  Surgeon: Lynwood FORBES Better, MD;  Location: [facility] ORS;  Service: Orthopedics;  Laterality: Left;   ORIF FEMORAL NECK FRACTURE W/ [facility]  11/07/2011   Dr.Nitka    TONSILLECTOMY     Dr.Joe Claudene   TUBAL LIGATION     VAGINAL PROLAPSE REPAIR  2002   Dr.Horback     Allergies[1]  Outpatient Encounter Medications as of 06/07/2024  Medication Sig   acetaminophen  (TYLENOL ) 325 MG tablet Take 650 mg by mouth every 4 (four) hours as needed for fever.   Acetylcysteine (NAC) 500 MG CAPS Take 600 mg by mouth daily.   antiseptic oral rinse (BIOTENE) LIQD 10 mLs by Mouth Rinse route as needed for dry mouth. Resident may self administer and keep in room   aspirin  81 MG chewable tablet Chew 81 mg by mouth daily.   bismuth subsalicylate (PEPTO BISMOL) 262 MG/15ML suspension Take 30 mLs by mouth daily as needed for diarrhea or loose stools.   buPROPion  (WELLBUTRIN  XL) 150 MG 24 hr tablet Take 150 mg by mouth daily.   Calcium   Carb-Cholecalciferol  600-800 MG-UNIT TABS Take 1 tablet by mouth 2 (two) times daily.   cholecalciferol  (VITAMIN D ) 1000 UNITS tablet Take 1,000 Units by mouth daily.   docusate sodium  (COLACE) 100 MG capsule Take 1 capsule (100 mg total) by mouth every other day.   escitalopram  (LEXAPRO ) 20 MG tablet Take 1 tablet (20 mg total) by mouth daily.   Glucosamine HCl 500 MG TABS Take 4 capsules by mouth 2 (two) times daily.   hydrocortisone  2.5 % cream Apply 1 Application topically See admin instructions. Apply to anal topically as needed for irriation as needed twice  daily   hydrocortisone  cream 1 % Apply 1 Application topically 2 (two) times daily.   hydrOXYzine  (ATARAX ) 10 MG tablet Take 1 tablet (10 mg total) by mouth 2 (two) times daily.   hypromellose (GENTEAL SEVERE) 0.3 % GEL ophthalmic ointment Place 1 Application into both eyes as needed for dry eyes.   lactose free nutrition (BOOST) LIQD Take 237 mLs by mouth 3 (three) times daily between meals. Patient prefers vanilla serve at room temperature.   LORazepam  (ATIVAN ) 0.5 MG tablet Take 1 tablet (0.5 mg total) by mouth every 6 (six) hours.   lubiprostone  (AMITIZA ) 8 MCG capsule Take 8 mcg by mouth 2 (two) times daily with a meal.   Menthol , Topical Analgesic, 4 % GEL Apply 1 application  topically as needed. Resident may self administer and keep in room   mirtazapine  (REMERON ) 45 MG tablet Take 1 tablet (45 mg total) by mouth at bedtime.   Multiple Vitamins-Minerals (PRESERVISION AREDS 2+MULTI VIT PO) Take 1 tablet by mouth 2 (two) times daily.   NIFEdipine  (PROCARDIA  XL/NIFEDICAL XL) 60 MG 24 hr tablet Take 60 mg by mouth daily.   nystatin  (MYCOSTATIN /NYSTOP ) powder Apply 1 Application topically as needed (rash). Apply to groin, and breast topically one time a day for redness   pantoprazole  (PROTONIX ) 40 MG tablet One daily to reduce stomach acid   polyethylene glycol (MIRALAX  / GLYCOLAX ) 17 g packet Take 17 g by mouth daily. Mix 17g  with 8  oz. of fluid daily for constipation   Povidone (IVIZIA DRY EYES OP) Place 2 drops into both eyes 4 (four) times daily.   pravastatin  (PRAVACHOL ) 10 MG tablet Take 10 mg by mouth daily.   hydrocortisone  1 % ointment Apply 1 Application topically 2 (two) times daily. Apply to perineum (Patient not taking: Reported on 06/07/2024)   NIFEdipine  (ADALAT  CC) 60 MG 24 hr tablet Take 60 mg by mouth daily. (Patient not taking: Reported on 06/07/2024)   No facility-administered encounter medications on file as of 06/07/2024.    Review of Systems  Constitutional:  Positive for fatigue. Negative for fever and unexpected weight change.  HENT:  Positive for hearing loss. Negative for congestion and voice change.   Eyes:  Negative for visual disturbance.  Respiratory:  Negative for cough and shortness of breath.   Cardiovascular:  Positive for leg swelling. Negative for chest pain and palpitations.  Gastrointestinal:  Negative for abdominal pain and constipation.  Genitourinary:  Negative for difficulty urinating, dysuria and urgency.       Incontinent of urine  Musculoskeletal:  Positive for arthralgias, back pain and gait problem.  Skin:  Positive for rash.  Neurological:  Positive for tremors and numbness. Negative for speech difficulty and headaches.       Right finger resting tremor mild, left fingers feels numb  Psychiatric/Behavioral:  Positive for dysphoric mood. Negative for behavioral problems, hallucinations and sleep disturbance. The patient is nervous/anxious.        Worries, fears    Immunization History  Administered Date(s) Administered   Fluad Quad(high Dose 65+) 03/09/2022, 03/14/2023   INFLUENZA, HIGH DOSE SEASONAL PF 03/14/2017, 02/26/2019, 03/14/2023   Influenza Whole 02/14/2012, 02/14/2013   Influenza-Unspecified 02/27/2014, 02/12/2015, 02/25/2016, 03/15/2017, 02/19/2018, 02/18/2020, 03/09/2021, 02/20/2024   MODERNA COVID-19 SARS-COV-2 PEDS BIVALENT BOOSTER 54yr-38yr 03/22/2022    Moderna SARS-COV2 Booster Vaccination 10/21/2020   Moderna Sars-Covid-2 Vaccination 05/20/2019, 06/17/2019, 03/30/2020, 03/22/2022   PFIZER(Purple Top)SARS-COV-2 Vaccination 02/03/2021   PPD Test 05/30/2024   Pneumococcal Conjugate-13 07/13/2015   Pneumococcal  Polysaccharide-23 05/16/1998   Td 05/16/2004   Unspecified SARS-COV-2 Vaccination 03/22/2023   Zoster Recombinant(Shingrix) 02/20/2018, 05/11/2018, 07/16/2018   Zoster, Live 05/16/2005   Pertinent  Health Maintenance Due  Topic Date Due   Influenza Vaccine  Completed   Bone Density Scan  Completed   Mammogram  Discontinued      12/26/2022   10:48 AM 03/20/2023   12:58 PM 08/11/2023   11:25 AM 01/29/2024   11:39 AM 01/31/2024    3:53 PM  Fall Risk  Falls in the past year? 0 0 0 0 0  Was there an injury with Fall? 0  0  0  0  0   Fall Risk Category Calculator 0 0 0 0 0  Patient at Risk for Falls Due to History of fall(s);Impaired balance/gait History of fall(s);Impaired balance/gait;Impaired mobility History of fall(s);Impaired mobility History of fall(s) History of fall(s)  Fall risk Follow up Falls evaluation completed;Education provided;Falls prevention discussed Falls evaluation completed;Education provided;Falls prevention discussed Falls evaluation completed;Education provided Falls evaluation completed Falls evaluation completed     Data saved with a previous flowsheet row definition   Functional Status Survey:    Vitals:   06/07/24 1108 06/07/24 1109  BP: (!) 158/79 116/64  Pulse: 82   Temp: (!) 97.2 F (36.2 C)   SpO2: 92%   Weight: 131 lb 9.6 oz (59.7 kg)   Height: 5' 2 (1.575 m)    Body mass index is 24.07 kg/m. Physical Exam Vitals and nursing note reviewed.  Constitutional:      Appearance: Normal appearance.  HENT:     Head: Normocephalic and atraumatic.     Mouth/Throat:     Mouth: Mucous membranes are moist.     Comments: a painful firm lump under the left mandible, smooth, no coating, slight  reddened tongue.   Eyes:     Extraocular Movements: Extraocular movements intact.     Conjunctiva/sclera: Conjunctivae normal.     Pupils: Pupils are equal, round, and reactive to light.  Cardiovascular:     Rate and Rhythm: Normal rate and regular rhythm.     Heart sounds: Murmur heard.  Pulmonary:     Breath sounds: No rales.  Abdominal:     General: Bowel sounds are normal.     Palpations: Abdomen is soft.     Tenderness: There is no abdominal tenderness.  Genitourinary:    Rectum: Normal.     Comments: No internal or external hemorrhoids noted, no hard stools in the rectal vault.  Musculoskeletal:     Cervical back: Normal range of motion and neck supple.     Right lower leg: Edema present.     Left lower leg: Edema present.     Comments: Trace edema BLE. TED  Skin:    General: Skin is warm and dry.     Findings: Rash present.     Comments: rash under the right breast, medical aspect of R+L inner thighs.    Neurological:     General: No focal deficit present.     Mental Status: She is alert and oriented to person, place, and time. Mental status is at baseline.     Gait: Gait abnormal.  Psychiatric:     Comments: Anxious, repetitive.      Labs reviewed: Recent Labs    05/27/24 0237 05/28/24 0506 05/29/24 0235  NA 134* 136 135  K 4.3 3.8 3.6  CL 101 101 102  CO2 23 25 24   GLUCOSE 110* 90 112*  BUN 33* 28* 26*  CREATININE 1.19* 0.96 0.78  CALCIUM  9.5 9.2 9.1  MG  --   --  2.1   Recent Labs    07/31/23 0000 05/26/24 1649  AST 14 303*  ALT 8 114*  ALKPHOS 67 96  BILITOT  --  0.5  PROT  --  7.3  ALBUMIN 3.9 3.7   Recent Labs    05/27/24 0237 05/28/24 0506 05/29/24 0235 06/04/24 0000  WBC 14.0* 9.0 6.4 6.1  NEUTROABS  --   --  5.0  --   HGB 10.5* 10.1* 9.7* 10.0*  HCT 32.8* 30.8* 28.9* 32*  MCV 93.4 91.7 90.6  --   PLT 126* 107* 121*  --    Lab Results  Component Value Date   TSH 1.62 06/04/2024   Lab Results  Component Value Date    HGBA1C 5.9 06/04/2024   Lab Results  Component Value Date   CHOL 136 06/04/2024   HDL 32 (A) 06/04/2024   LDLCALC 82 06/04/2024   TRIG 128 06/04/2024   CHOLHDL 3.5 08/04/2017    Significant Diagnostic Results in last 30 days:  DG Swallowing Func-Speech Pathology Result Date: 05/28/2024 Table formatting from the original result was not included. Modified Barium Swallow Study Patient Details Name: Leonilda Cozby MRN: [id number] Date of Birth: [birth year] Today's Date: 05/28/2024 HPI/PMH: HPI: Pt is 89 year old presented to [facility] on  05/26/24 for fall and AMS. Admitted with UTI. CT C/A/P shows a large hiatal hernia but no focal consolidation or pulmonary edema. PMH: HTN, osteoporosis,DDD, depression, hip fx, GERD Clinical Impression: Pt exhibits mild pharyngeal dysphagia characterized by occasional mistiming and decreased BOT retraction. Epiglottic inversion and laryngeal elevation are complete. Timing becomes more disorganized with larger boluses and the bolus pools in the pyriform sinuses before the swallow. This resulted in one instance of sensed aspiration with thin liquids as the bolus spilled over the pyriform sinuses before the laryngeal vestibule closes completely (PAS 7). Trace pharyngeal residue is suspected to be contributed to by reduced BOT retraction in addition to chronic degenerative cervical spine changes (see CT) and a prominent cricopharyngeus. The 13 mm barium tablet was given with purees, resulting in distal esophageal retention. Anticipate episodic aspiration of trace amounts (which is considered a mild impairment) and pt does not exhibit significant factors that may increase risk in its presence. Continue current diet with meds given whole in puree. Encourage cueing to control the rate/volume of sips of liquid and use of frequent oral care. Will f/u for ongoing education with pt and her family.  DIGEST Swallow Severity Rating*             Safety: 1             Efficiency: 1              Overall Pharyngeal Swallow Severity: 1 (mild) 1: mild; 2: moderate; 3: severe; 4: profound *The Dynamic Imaging Grade of Swallowing Toxicity is standardized for the head and neck cancer population, however, demonstrates promising clinical applications across populations to standardize the clinical rating of pharyngeal swallow safety and severity. Factors that may increase risk of adverse event in presence of aspiration Noe & Lianne 2021): Factors that may increase risk of adverse event in presence of aspiration Noe & Lianne 2021): Reduced cognitive function; Limited mobility; Frail or deconditioned Recommendations/Plan: Swallowing Evaluation Recommendations Swallowing Evaluation Recommendations Recommendations: PO diet PO Diet Recommendation: Regular; Thin liquids (Level 0) Liquid Administration via: Cup; Straw Medication Administration: Whole meds  with puree Supervision: Full assist for feeding; Full supervision/cueing for swallowing strategies Swallowing strategies  : Slow rate; Small bites/sips Postural changes: Position pt fully upright for meals; Stay upright 30-60 min after meals Oral care recommendations: Oral care BID (2x/day) Recommended consults: Consider Palliative care Treatment Plan Treatment Plan Treatment recommendations: Defer treatment plan to SLP at other venue (see follow-up recommendations) Follow-up recommendations: Skilled nursing-short term rehab (<3 hours/day) Functional status assessment: Patient has had a recent decline in their functional status and demonstrates the ability to make significant improvements in function in a reasonable and predictable amount of time. Recommendations Recommendations for follow up therapy are one component of a multi-disciplinary discharge planning process, led by the attending physician.  Recommendations may be updated based on patient status, additional functional criteria and insurance authorization. Assessment: Orofacial Exam: Orofacial Exam Oral  Cavity: Oral Hygiene: Xerostomia Oral Cavity - Dentition: Adequate natural dentition Orofacial Anatomy: WFL Oral Motor/Sensory Function: WFL Anatomy: Anatomy: Suspected cervical osteophytes; Prominent cricopharyngeus Boluses Administered: Boluses Administered Boluses Administered: Thin liquids (Level 0); Mildly thick liquids (Level 2, nectar thick); Moderately thick liquids (Level 3, honey thick); Puree; Solid  Oral Impairment Domain: Oral Impairment Domain Lip Closure: No labial escape Tongue control during bolus hold: Cohesive bolus between tongue to palatal seal Bolus preparation/mastication: Timely and efficient chewing and mashing Bolus transport/lingual motion: Brisk tongue motion Oral residue: Complete oral clearance Location of oral residue : N/A Initiation of pharyngeal swallow : Pyriform sinuses  Pharyngeal Impairment Domain: Pharyngeal Impairment Domain Soft palate elevation: No bolus between soft palate (SP)/pharyngeal wall (PW) Laryngeal elevation: Complete superior movement of thyroid  cartilage with complete approximation of arytenoids to epiglottic petiole Anterior hyoid excursion: Complete anterior movement Epiglottic movement: Complete inversion Laryngeal vestibule closure: Complete, no air/contrast in laryngeal vestibule Pharyngeal stripping wave : Present - complete Pharyngeal contraction (A/P view only): N/A Pharyngoesophageal segment opening: Partial distention/partial duration, partial obstruction of flow Tongue base retraction: Wide column of contrast or air between tongue base and PPW Pharyngeal residue: Trace residue within or on pharyngeal structures Location of pharyngeal residue: Tongue base; Valleculae; Pyriform sinuses  Esophageal Impairment Domain: Esophageal Impairment Domain Esophageal clearance upright position: Esophageal retention Pill: Pill Consistency administered: Puree Puree: WFL Penetration/Aspiration Scale Score: Penetration/Aspiration Scale Score 1.  Material does not enter  airway: Moderately thick liquids (Level 3, honey thick); Puree; Solid; Pill 2.  Material enters airway, remains ABOVE vocal cords then ejected out: Mildly thick liquids (Level 2, nectar thick) 7.  Material enters airway, passes BELOW cords and not ejected out despite cough attempt by patient: Thin liquids (Level 0) Compensatory Strategies: Compensatory Strategies Compensatory strategies: No   General Information: Caregiver present: No  Diet Prior to this Study: Regular; Thin liquids (Level 0)   Temperature : Normal   Respiratory Status: WFL   Supplemental O2: None (Room air)   History of Recent Intubation: No  Behavior/Cognition: Alert; Cooperative; Requires cueing Self-Feeding Abilities: Needs assist with self-feeding Baseline vocal quality/speech: Normal Volitional Cough: Able to elicit Volitional Swallow: Able to elicit Exam Limitations: No limitations Goal Planning: Prognosis for improved oropharyngeal function: Fair Barriers to Reach Goals: Cognitive deficits; Time post onset No data recorded Patient/Family Stated Goal: none stated Consulted and agree with results and recommendations: Patient; Family member/caregiver Pain: Pain Assessment Pain Assessment: Faces Faces Pain Scale: 0 Pain Location: all over Pain Descriptors / Indicators: Grimacing; Moaning Pain Intervention(s): Monitored during session End of Session: Start Time:SLP Start Time (ACUTE ONLY): 1017 Stop Time: SLP Stop Time (ACUTE ONLY): 1032  Time Calculation:SLP Time Calculation (min) (ACUTE ONLY): 15 min Charges: SLP Evaluations $ SLP Speech Visit: 1 Visit SLP Evaluations $BSS Swallow: 1 Procedure $MBS Swallow: 1 Procedure SLP visit diagnosis: SLP Visit Diagnosis: Dysphagia, pharyngeal phase (R13.13) Past Medical History: Past Medical History: Diagnosis Date  Abnormal mammogram, unspecified 04/19/2011  Allergy   Anxiety   Backache, unspecified 01/21/2005  Cervicalgia 08/20/2013  Chronic kidney disease, stage II (mild) 04/03/2009  Chronic rhinitis  09/28/2010  DDD (degenerative disc disease)   Depression   Dermatophytosis of nail 01/21/2005  Diverticulosis of colon (without mention of hemorrhage) 06/17/2005  Edema 11/26/2004  GERD (gastroesophageal reflux disease)   Hiatal hernia   High cholesterol   Hyperglycemia 11/18/2014  Hypertension   Insomnia, unspecified 10/18/2011  Left inguinal hernia 04/24/2014  Nocturia 09/19/2008  Osteoporosis, senile 11/18/2014  Other abnormal blood chemistry 04/03/2007  Pain in joint, pelvic region and thigh 11/12/2011  Pain in joint, shoulder region 08/15/2006  Pain in limb 08/26/2005  Palpitations 09/29/2009  PAOD (peripheral arterial occlusive disease) 11/18/2014  Right leg; diminished popliteal, dorsalis pedis, and posterior tibial artery pulsations   Postmenopausal atrophic vaginitis 12/14/2004  Scoliosis   Senile osteoporosis 12/14/2004  Tension headache 10/04/2005  Unspecified cataract 11/26/2004  Unspecified constipation 01/21/2005  Unspecified urinary incontinence 11/26/2004  Vaginal stenosis 11/18/2014  Tight band about 1/2 inches into the vagina which will not allow passage of my index finger.  Past Surgical History: Past Surgical History: Procedure Laterality Date  ABDOMINAL HYSTERECTOMY  1969  Dr.Braun   bilateral eyelid surgery  2005  Dr.Scott  BILATERAL OOPHORECTOMY  2002  BREAST SURGERY    benign tumor removal, left breast 1969  CATARACT EXTRACTION    Right  CHOLECYSTECTOMY  1990  Dr.Moore   COLONOSCOPY  3/18/20008  inflammation, diverticular associated colitis -Dr. Teressa  COSMETIC SURGERY    ESOPHAGOGASTRODUODENOSCOPY N/A 01/24/2014  Procedure: ESOPHAGOGASTRODUODENOSCOPY (EGD);  Surgeon: Norleen LOISE Kiang, MD;  Location: [facility] ENDOSCOPY;  Service: Endoscopy;  Laterality: N/A;  ESOPHAGOGASTRODUODENOSCOPY ENDOSCOPY  04/04/14  Dr. Teressa  EYE SURGERY    FOREIGN BODY REMOVAL N/A 01/24/2014  Procedure: FOREIGN BODY REMOVAL;  Surgeon: Norleen LOISE Kiang, MD;  Location: [facility] ENDOSCOPY;  Service: Endoscopy;  Laterality: N/A;  HIP PINNING,CANNULATED  11/07/2011   Procedure: CANNULATED HIP PINNING;  Surgeon: Lynwood FORBES Better, MD;  Location: [facility] ORS;  Service: Orthopedics;  Laterality: Left;  ORIF FEMORAL NECK FRACTURE W/ [facility]  11/07/2011  Dr.Nitka   TONSILLECTOMY    Dr.Joe Claudene  TUBAL LIGATION    VAGINAL PROLAPSE REPAIR  2002  Dr.Horback  Damien Blumenthal, M.A., CCC-SLP Speech Language Pathology, Acute Rehabilitation Services Secure Chat preferred [phone] 05/28/2024, 11:22 AM  CT CERVICAL SPINE WO CONTRAST Result Date: 05/26/2024 EXAM: CT CERVICAL SPINE WITHOUT CONTRAST 05/26/2024 07:11:31 PM TECHNIQUE: CT of the cervical spine was performed without the administration of intravenous contrast. Multiplanar reformatted images are provided for review. Automated exposure control, iterative reconstruction, and/or weight based adjustment of the mA/kV was utilized to reduce the radiation dose to as low as reasonably achievable. COMPARISON: None available. CLINICAL HISTORY: Polytrauma, blunt. FINDINGS: BONES AND ALIGNMENT: 3 mm anterolisthesis C7-T1, likely degenerative in nature. Degenerative ankylosis C4-C5. Ankylosis of the facet joints of C3-C5 bilaterally. No acute fracture or traumatic listhesis. Motion degraded examination. DEGENERATIVE CHANGES: Advanced degenerative disc disease throughout the cervical spine with disc space narrowing and endplate remodeling. 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 Multilevel uncovertebral and facet arthrosis results in multilevel  moderate-to-severe neuroforaminal narrowing, most severe at C5-C6 and C6-C7. SOFT TISSUES: No prevertebral soft tissue swelling. IMPRESSION: 1. No acute fracture or traumatic listhesis. 2. Motion degraded examination. 3. 3 mm anterolisthesis at C7-T1, likely degenerative. 4. Advanced degenerative disc disease throughout the cervical spine with disc space narrowing and endplate remodeling. 5. Ankylosis of the facet joints of C3-C5  bilaterally. 6. Degenerative ankylosis at C4-5. 7. Moderate central canal stenosis at C4-5 secondary to posterior disc osteophyte complex and congenital narrowing of the pedicles. 8. Multilevel moderate-to-severe neuroforaminal narrowing, most severe at C5-6 and C6-7. Electronically signed by: Dorethia Molt MD MD 05/26/2024 07:50 PM EST RP Workstation: [id number]   CT CHEST ABDOMEN PELVIS WO CONTRAST Result Date: 05/26/2024 EXAM: CT CHEST, ABDOMEN AND PELVIS WITHOUT CONTRAST 05/26/2024 07:11:31 PM TECHNIQUE: CT of the chest, abdomen and pelvis was performed without the administration of intravenous contrast. Multiplanar reformatted images are provided for review. Automated exposure control, iterative reconstruction, and/or weight based adjustment of the mA/kV was utilized to reduce the radiation dose to as low as reasonably achievable. COMPARISON: 04/20/2014 CLINICAL HISTORY: Polytrauma, blunt. Multiple unwitnessed fall, bruising, altered mental status. FINDINGS: CHEST: MEDIASTINUM AND LYMPH NODES: Heart and pericardium are unremarkable. Extensive multivessel coronary artery calcification. Extensive calcification of the aortic valve leaflets. The central pulmonary arteries are enlarged in keeping with changes of pulmonary arterial hypertension. Moderate atherosclerotic calcification within the thoracic aorta. 22 mm nodule within the left thyroid  lobe, not well assessed this examination. No follow-up imaging recommended unless clinically indicated. The central airways are clear. No mediastinal, hilar or axillary lymphadenopathy. LUNGS AND PLEURA: Large hiatal hernia, and large since prior examination, with the entire stomach, distal pancreas and the distal transverse and proximal descending colon within the thoracic compartment. 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 No focal consolidation or pulmonary edema. No pleural effusion. No pneumothorax. ABDOMEN AND PELVIS: LIVER: Unremarkable.  GALLBLADDER AND BILE DUCTS: Status post cholecystectomy. No biliary ductal dilatation. SPLEEN: No acute abnormality. PANCREAS: No acute abnormality. ADRENAL GLANDS: No acute abnormality. KIDNEYS, URETERS AND BLADDER: Multiple nonobstructing calculi are seen within the right renal pelvis measuring 4 and 10 mm. There is superimposed mild asymmetric right perinephric and peripelvic stranding. Perinephric and peripelvic stranding from the right may reflect residual of a recently passed renal calculus. Alternatively, inflammatory condition such as an ascending urinary tract infection could appear similarly. Correlation with urinalysis and urine culture may be helpful for further management. No hydronephrosis. The left kidney is unremarkable; no intrarenal or ureteral calculi are identified and there is no hydronephrosis. Several granular dependently layering calcifications are seen within the bladder lumen measuring 2-3 mm in size. The bladder is partially decompressed. There is, however, circumferential bladder wall thickening noted which may reflect changes of underlying cystitis or chronic bladder outlet obstruction. GI AND BOWEL: Severe descending and sigmoid colonic diverticulosis without superimposed acute inflammatory change. The visualized bowel is otherwise unremarkable. No evidence of obstruction. Appendix normal. REPRODUCTIVE ORGANS: Status post hysterectomy. No acute abnormality. PERITONEUM AND RETROPERITONEUM: No ascites. No free air. VASCULATURE: Aorta is normal in caliber. Moderate aortoiliac atherosclerotic calcification. ABDOMINAL AND PELVIS LYMPH NODES: No lymphadenopathy. BONES AND SOFT TISSUES: Left hip pinning has been performed. Severe lumbar dextroscoliosis. Osseous structures are age appropriate with advanced degenerative changes seen within the thoracolumbar spine. No focal soft tissue abnormality. Had some masses. IMPRESSION: 1. Large hiatal hernia with organoaxial gastric volvulus without  evidence of obstruction. 2. Multiple nonobstructing calculi in the right renal pelvis with mild asymmetric right perinephric and peripelvic  stranding, which may reflect residual of a recently passed renal calculus or an ascending urinary tract infection; urinalysis and urine culture may be helpful for further management. 3. Circumferential bladder wall thickening, which may reflect cystitis or chronic bladder outlet obstruction. 4. Severe descending and sigmoid colonic diverticulosis without acute inflammatory change. 5. Enlarged central pulmonary arteries, in keeping with pulmonary arterial hypertension. 6. Extensive multivessel coronary artery calcification and extensive calcification of the aortic valve leaflets. 7. Left thyroid  nodule measuring 22 mm, not well assessed on this examination; no follow-up imaging recommended unless clinically indicated. 8. Severe lumbar dextroscoliosis with advanced degenerative changes of the thoracolumbar spine. 9. Raf score includes aortic atherosclerosis (ICD10-I70.0). Electronically signed by: Dorethia Molt MD MD 05/26/2024 07:45 PM EST RP Workstation: [id number]   CT T-SPINE NO CHARGE Result Date: 05/26/2024 EXAM: CT THORACIC SPINE WITHOUT CONTRAST 05/26/2024 07:11:31 PM TECHNIQUE: CT of the thoracic spine was performed without the administration of intravenous contrast. Multiplanar reformatted images are provided for review. Automated exposure control, iterative reconstruction, and/or weight based adjustment of the mA/kV was utilized to reduce the radiation dose to as low as reasonably achievable. COMPARISON: None available. CLINICAL HISTORY: FINDINGS: BONES AND ALIGNMENT: Normal vertebral body heights. 2 mm anterolisthesis T2-3, likely degenerative in nature. No acute fracture or traumatic listhesis of the thoracic spine. No suspicious bone lesion. DEGENERATIVE CHANGES: Intervertebral disc space narrowing and endplate remodeling throughout the thoracic spine in keeping  with changes of moderate-to-severe degenerative disc disease. No high-grade canal stenosis. Moderate bilateral neuroforaminal narrowing at T8-9. No high-grade neural foraminal narrowing identified. SOFT TISSUES: Left lower lobe consolidation, better assessed on accompanying CT examination of the chest, abdomen, and pelvis. Please refer to that report for further details. IMPRESSION: 1. No acute fracture or traumatic listhesis of the thoracic spine. 2. Left lower lobe consolidation. 3. Moderate-to-severe degenerative disc disease throughout the thoracic spine, without high-grade canal stenosis. 4. Moderate bilateral neuroforaminal narrowing at T8-9. 5. Minimal anterolisthesis at T2-3, likely degenerative. Electronically signed by: Dorethia Molt MD MD 05/26/2024 07:33 PM EST RP Workstation: [id number]   CT L-SPINE NO CHARGE Result Date: 05/26/2024 EXAM: CT OF THE LUMBAR SPINE WITHOUT CONTRAST 05/26/2024 07:11:31 PM TECHNIQUE: CT of the lumbar spine was performed without the administration of intravenous contrast. Multiplanar reformatted images are provided for review. Automated exposure control, iterative reconstruction, and/or weight based adjustment of the mA/kV was utilized to reduce the radiation dose to as low as reasonably achievable. COMPARISON: None available. CLINICAL HISTORY: FINDINGS: BONES AND ALIGNMENT: Normal vertebral body heights. No acute fracture or suspicious bone lesion. No traumatic listhesis. Severe lumbar dextroscoliosis, apex right at L3. Mild loss of the normal lumbar lordosis. There is degenerative ankylosis of the L2 and L3 vertebral bodies along the left lateral aspect. DEGENERATIVE CHANGES: There is diffuse intervertebral disc space narrowing, endplate remodeling, and vacuum disc phenomena throughout the lumbar spine in keeping with changes of severe degenerative disc disease. Multifactorial moderate canal stenosis at L3-4 and L4-5. Severe right neuroforaminal narrowing at L4-5 and  L5-S1 secondary to disc height loss and osteophytic ridging and facet arthrosis. Moderate-to-severe left neuroforaminal narrowing at L3-4 and L5-S1. SOFT TISSUES: Multiple nonobstructing calculi within the right kidney measuring up to 11 mm. Moderate aortoiliac atherosclerotic calcification. Severe sigmoid diverticulosis noted. IMPRESSION: 1. No acute fracture or traumatic listhesis. 2. Severe lumbar dextroscoliosis, apex right at L3, with mild loss of the normal lumbar lordosis. 3. Severe degenerative disc disease with diffuse intervertebral disc space narrowing, endplate remodeling, and vacuum disc phenomena throughout the  lumbar spine, with degenerative ankylosis of the L2 and L3 vertebral bodies along the left lateral aspect. 4. Multifactorial moderate canal stenosis at L3-4 and L4-5. 5. Severe right neuroforaminal narrowing at L4-5 and L5-S1, with moderate-to-severe left neuroforaminal narrowing at L3-4 and L5-S1. 6. Multiple nonobstructing right renal calculi measuring up to 11 mm. 7. Moderate aortoiliac atherosclerotic calcification. 8. Severe sigmoid diverticulosis. Electronically signed by: Dorethia Molt MD MD 05/26/2024 07:30 PM EST RP Workstation: [id number]   CT HEAD WO CONTRAST Result Date: 05/26/2024 EXAM: CT HEAD WITHOUT CONTRAST 05/26/2024 07:11:31 PM TECHNIQUE: CT of the head was performed without the administration of intravenous contrast. Automated exposure control, iterative reconstruction, and/or weight based adjustment of the mA/kV was utilized to reduce the radiation dose to as low as reasonably achievable. COMPARISON: None available. CLINICAL HISTORY: Head trauma, moderate-severe. FINDINGS: BRAIN AND VENTRICLES: No acute hemorrhage. No evidence of acute infarct. No hydrocephalus. No extra-axial collection. No mass effect or midline shift. Cerebral volume loss. Moderate patchy supratentorial white matter hypodensities. ORBITS: No acute abnormality. SINUSES: Moderate mucosal thickening in  left ethmoid sinus. SOFT TISSUES AND SKULL: No acute soft tissue abnormality. No skull fracture. VASCULATURE: Atherosclerosis. LIMITATIONS/ARTIFACTS: Motion artifact. IMPRESSION: 1. No acute intracranial abnormality. 2. Chronic cerebral volume loss and moderate chronic supratentorial white matter change. 3. Atherosclerosis. Electronically signed by: Dorethia Molt MD MD 05/26/2024 07:17 PM EST RP Workstation: [id number]    Assessment/Plan Sialadenitis a painful firm lump under the left mandible, smooth, no coating, slight reddened tongue.  Will try with Clindamycin 300mg  bid x 7days, also 7 day course of Magic mouth wash 5mg  s/s ac+at bedtime x7 days.    Prediabetes Hgb A1c 5.9 06/04/24 Continue diet control  Hyperlipidemia LDL goal <100  takes Pravastatin . LDL 82 06/04/24  GAD (generalized anxiety disorder) Long standing issue, still anxious, failed GDR of Lorazepam  takes Mirtazapine , Lexapro , Wellbutrin , hydroxyzine , off clonazepam , resumed Lorazepam , TSH 1.62, Vit B12 756 06/04/24  Chronic anemia Hgb 10, Vit B12 756 06/04/24  Hypertension  blood pressure is controlled, on Nifedipine , ASA, Bun/creat 26/0.78 05/29/24  Primary osteoarthritis involving multiple joints taking Tylenol ,  ambulates with walker prior to hospitalization.   Constipation on Senokot, MiraLax , lubiprostone      Family/ staff Communication: plan of care reviewed with the patient and charge nurse.   Labs/tests ordered:  none     [1]  Allergies Allergen Reactions   Brexpiprazole Swelling   Lamictal [Lamotrigine] Swelling    Lip swelling   Penicillins Other (See Comments)    CHILDHOOD REACTION   Abilify [Aripiprazole] Rash   "

## 2024-06-07 NOTE — Assessment & Plan Note (Signed)
 taking Tylenol ,  ambulates with walker prior to hospitalization.

## 2024-06-07 NOTE — Assessment & Plan Note (Signed)
 on Senokot, MiraLax , lubiprostone 

## 2024-06-07 NOTE — Assessment & Plan Note (Signed)
 Hgb A1c 5.9 06/04/24 Continue diet control

## 2024-06-07 NOTE — Assessment & Plan Note (Signed)
 a painful firm lump under the left mandible, smooth, no coating, slight reddened tongue.  Will try with Clindamycin 300mg  bid x 7days, also 7 day course of Magic mouth wash 5mg  s/s ac+at bedtime x7 days.

## 2024-06-07 NOTE — Assessment & Plan Note (Signed)
 Hgb 10, Vit B12 756 06/04/24

## 2024-06-07 NOTE — Assessment & Plan Note (Signed)
 Long standing issue, still anxious, failed GDR of Lorazepam  takes Mirtazapine , Lexapro , Wellbutrin , hydroxyzine , off clonazepam , resumed Lorazepam , TSH 1.62, Vit B12 756 06/04/24

## 2024-06-07 NOTE — Assessment & Plan Note (Signed)
 blood pressure is controlled, on Nifedipine , ASA, Bun/creat 26/0.78 05/29/24

## 2024-06-07 NOTE — Assessment & Plan Note (Signed)
stable, on Pantoprazole  

## 2024-06-07 NOTE — Assessment & Plan Note (Signed)
"   takes Pravastatin . LDL 82 06/04/24 "

## 2024-06-12 ENCOUNTER — Encounter: Payer: Self-pay | Admitting: Nurse Practitioner

## 2024-06-12 ENCOUNTER — Non-Acute Institutional Stay: Payer: Self-pay | Admitting: Nurse Practitioner

## 2024-06-12 ENCOUNTER — Non-Acute Institutional Stay (SKILLED_NURSING_FACILITY): Payer: Self-pay | Admitting: Nurse Practitioner

## 2024-06-12 DIAGNOSIS — I10 Essential (primary) hypertension: Secondary | ICD-10-CM

## 2024-06-12 DIAGNOSIS — K219 Gastro-esophageal reflux disease without esophagitis: Secondary | ICD-10-CM | POA: Diagnosis not present

## 2024-06-12 DIAGNOSIS — E785 Hyperlipidemia, unspecified: Secondary | ICD-10-CM | POA: Diagnosis not present

## 2024-06-12 DIAGNOSIS — K5901 Slow transit constipation: Secondary | ICD-10-CM | POA: Diagnosis not present

## 2024-06-12 DIAGNOSIS — R7303 Prediabetes: Secondary | ICD-10-CM | POA: Diagnosis not present

## 2024-06-12 DIAGNOSIS — D649 Anemia, unspecified: Secondary | ICD-10-CM

## 2024-06-12 DIAGNOSIS — F411 Generalized anxiety disorder: Secondary | ICD-10-CM

## 2024-06-12 NOTE — Progress Notes (Unsigned)
 " Location:  [facility] Room Number: N040-A Place of Service:  SNF [phone])  Provider: Man Lorenda Hark N.P.  PCP: [name], MD Patient Care Team: [name], MD as PCP - General (Internal Medicine) Devora, [facility] Octavia Charleston, MD as Consulting Physician (Ophthalmology) Teressa Toribio SQUIBB, MD (Inactive) as Consulting Physician (Gastroenterology) Tasia Lung, MD as Consulting Physician (Psychiatry) Lucilla Lynwood BRAVO, MD (Inactive) as Consulting Physician (Orthopedic Surgery) Gaston Hamilton, MD as Consulting Physician (Urology)  Extended Emergency Contact Information Primary Emergency Contact: Aliya, Sol [place], [name] [place] Home Phone: [phone] Mobile Phone: [phone] Relation: Son Secondary Emergency Contact: [name],Worth Mobile Phone: [phone] Relation: Son  Code Status: DNR Goals of care:  Advanced Directive information    05/27/2024    3:47 PM  Advanced Directives  Type of Advance Directive Healthcare Power of Attorney  Does patient want to make changes to medical advance directive? No - Patient declined     Allergies[1]  Chief Complaint  Patient presents with   Facility Discharge    HPI:  89 y.o. female      Past Medical History:  Diagnosis Date   Abnormal mammogram, unspecified 04/19/2011   Allergy    Anxiety    Backache, unspecified 01/21/2005   Cervicalgia 08/20/2013   Chronic kidney disease, stage II (mild) 04/03/2009   Chronic rhinitis 09/28/2010   DDD (degenerative disc disease)    Depression    Dermatophytosis of nail 01/21/2005   Diverticulosis of colon (without mention of hemorrhage) 06/17/2005   Edema 11/26/2004   GERD (gastroesophageal reflux disease)    Hiatal hernia    High cholesterol    Hyperglycemia 11/18/2014   Hypertension    Insomnia, unspecified 10/18/2011   Left inguinal hernia 04/24/2014   Nocturia 09/19/2008   Osteoporosis, senile 11/18/2014   Other  abnormal blood chemistry 04/03/2007   Pain in joint, pelvic region and thigh 11/12/2011   Pain in joint, shoulder region 08/15/2006   Pain in limb 08/26/2005   Palpitations 09/29/2009   PAOD (peripheral arterial occlusive disease) 11/18/2014   Right leg; diminished popliteal, dorsalis pedis, and posterior tibial artery pulsations    Postmenopausal atrophic vaginitis 12/14/2004   Scoliosis    Senile osteoporosis 12/14/2004   Tension headache 10/04/2005   Unspecified cataract 11/26/2004   Unspecified constipation 01/21/2005   Unspecified urinary incontinence 11/26/2004   Vaginal stenosis 11/18/2014   Tight band about 1/2 inches into the vagina which will not allow passage of my index finger.     Past Surgical History:  Procedure Laterality Date   ABDOMINAL HYSTERECTOMY  1969   Dr.Braun    bilateral eyelid surgery  2005   Dr.Scott   BILATERAL OOPHORECTOMY  2002   BREAST SURGERY     benign tumor removal, left breast 1969   CATARACT EXTRACTION     Right   CHOLECYSTECTOMY  1990   Dr.Moore    COLONOSCOPY  3/18/20008   inflammation, diverticular associated colitis -Dr. Teressa   COSMETIC SURGERY     ESOPHAGOGASTRODUODENOSCOPY N/A 01/24/2014   Procedure: ESOPHAGOGASTRODUODENOSCOPY (EGD);  Surgeon: Norleen LOISE Kiang, MD;  Location: [facility] ENDOSCOPY;  Service: Endoscopy;  Laterality: N/A;   ESOPHAGOGASTRODUODENOSCOPY ENDOSCOPY  04/04/14   Dr. Teressa   EYE SURGERY     FOREIGN BODY REMOVAL N/A 01/24/2014   Procedure: FOREIGN BODY REMOVAL;  Surgeon: Norleen LOISE Kiang, MD;  Location: [facility] ENDOSCOPY;  Service: Endoscopy;  Laterality: N/A;  HIP PINNING,CANNULATED  11/07/2011   Procedure: CANNULATED HIP PINNING;  Surgeon: Lynwood FORBES Better, MD;  Location: [facility] ORS;  Service: Orthopedics;  Laterality: Left;   ORIF FEMORAL NECK FRACTURE W/ [facility]  11/07/2011   Dr.Nitka    TONSILLECTOMY     Dr.Joe Claudene   TUBAL LIGATION     VAGINAL PROLAPSE REPAIR  2002   Dr.Horback       reports that she has never smoked. She has never used  smokeless tobacco. She reports that she does not currently use alcohol . She reports that she does not use drugs. Social History   Socioeconomic History   Marital status: Widowed    Spouse name: Not on file   Number of children: Not on file   Years of education: Not on file   Highest education level: Not on file  Occupational History   Occupation: retired Midwife: RETIRED  Tobacco Use   Smoking status: Never   Smokeless tobacco: Never  Vaping Use   Vaping status: Never Used  Substance and Sexual Activity   Alcohol  use: Not Currently    Alcohol /week: 0.0 standard drinks of alcohol     Comment: one glass of wine in evening   Drug use: No   Sexual activity: Never  Other Topics Concern   Not on file  Social History Narrative   Lives at [facility] since 04/14/2005   Widowed (husband expired 2014)   Has Living Will, POA, MOST   Exercise: water aerobic  5 times a week   Walks with walker   Never smoked   Drinks moderate amount of caffeinate beverages daily   Alcohol  none      Social Drivers of Health   Tobacco Use: Low Risk (06/12/2024)   Patient History    Smoking Tobacco Use: Never    Smokeless Tobacco Use: Never    Passive Exposure: Not on file  Financial Resource Strain: Low Risk (08/11/2023)   Overall Financial Resource Strain (CARDIA)    Difficulty of Paying Living Expenses: Not hard at all  Food Insecurity: No Food Insecurity (05/27/2024)   Epic    Worried About Programme Researcher, Broadcasting/film/video in the Last Year: Never true    Ran Out of Food in the Last Year: Never true  Transportation Needs: No Transportation Needs (05/27/2024)   Epic    Lack of Transportation (Medical): No    Lack of Transportation (Non-Medical): No  Physical Activity: Insufficiently Active (08/11/2023)   Exercise Vital Sign    Days of Exercise per Week: 3 days    Minutes of Exercise per Session: 10 min  Stress: Stress Concern Present (08/11/2023)   [employer] of Occupational  Health - Occupational Stress Questionnaire    Feeling of Stress : Very much  Social Connections: Socially Isolated (08/11/2023)   Social Connection and Isolation Panel    Frequency of Communication with Friends and Family: Three times a week    Frequency of Social Gatherings with Friends and Family: Three times a week    Attends Religious Services: Never    Active Member of Clubs or Organizations: No    Attends Banker Meetings: Never    Marital Status: Widowed  Intimate Partner Violence: Not At Risk (08/11/2023)   Humiliation, Afraid, Rape, and Kick questionnaire    Fear of Current or Ex-Partner: No    Emotionally Abused: No    Physically Abused: No    Sexually Abused: No  Depression (PHQ2-9): High Risk (01/29/2024)  Depression (PHQ2-9)    PHQ-2 Score: 16  Alcohol  Screen: Low Risk (08/11/2023)   Alcohol  Screen    Last Alcohol  Screening Score (AUDIT): 0  Housing: Low Risk (05/27/2024)   Epic    Unable to Pay for Housing in the Last Year: No    Number of Times Moved in the Last Year: 0    Homeless in the Last Year: No  Utilities: Not At Risk (08/11/2023)   AHC Utilities    Threatened with loss of utilities: No  Health Literacy: Not on file   Functional Status Survey:    Allergies[2]  Pertinent  Health Maintenance Due  Topic Date Due   Influenza Vaccine  Completed   Bone Density Scan  Completed   Mammogram  Discontinued    Medications: Outpatient Encounter Medications as of 06/12/2024  Medication Sig   acetaminophen  (TYLENOL ) 325 MG tablet Take 650 mg by mouth every 4 (four) hours as needed for fever.   Acetylcysteine (NAC) 500 MG CAPS Take 600 mg by mouth daily.   antiseptic oral rinse (BIOTENE) LIQD 10 mLs by Mouth Rinse route as needed for dry mouth. Resident may self administer and keep in room   aspirin  81 MG chewable tablet Chew 81 mg by mouth daily.   bismuth subsalicylate (PEPTO BISMOL) 262 MG/15ML suspension Take 30 mLs by mouth daily as needed for  diarrhea or loose stools.   buPROPion  (WELLBUTRIN  XL) 150 MG 24 hr tablet Take 150 mg by mouth daily.   Calcium  Carb-Cholecalciferol  600-800 MG-UNIT TABS Take 1 tablet by mouth 2 (two) times daily.   cholecalciferol  (VITAMIN D ) 1000 UNITS tablet Take 1,000 Units by mouth daily.   clindamycin (CLEOCIN) 300 MG capsule Take 300 mg by mouth 2 (two) times daily. Give 300 mg by mouth two times a day for Salivary Gland Infection for 7 Days   docusate sodium  (COLACE) 100 MG capsule Take 1 capsule (100 mg total) by mouth every other day.   escitalopram  (LEXAPRO ) 20 MG tablet Take 1 tablet (20 mg total) by mouth daily.   Glucosamine HCl 500 MG TABS Take 4 capsules by mouth 2 (two) times daily.   hydrocortisone  2.5 % cream Apply 1 Application topically See admin instructions. Apply to anal topically as needed for irriation as needed twice daily   hydrocortisone  cream 1 % Apply 1 Application topically 2 (two) times daily.   hydrOXYzine  (ATARAX ) 10 MG tablet Take 1 tablet (10 mg total) by mouth 2 (two) times daily.   hypromellose (GENTEAL SEVERE) 0.3 % GEL ophthalmic ointment Place 1 Application into both eyes as needed for dry eyes.   lactose free nutrition (BOOST) LIQD Take 237 mLs by mouth 3 (three) times daily between meals. Patient prefers vanilla serve at room temperature.   LORazepam  (ATIVAN ) 0.5 MG tablet Take 1 tablet (0.5 mg total) by mouth every 6 (six) hours.   lubiprostone  (AMITIZA ) 8 MCG capsule Take 8 mcg by mouth 2 (two) times daily with a meal.   magic mouthwash (nystatin , hydrocortisone , diphenhydrAMINE) suspension Swish and spit 5 mLs 4 (four) times daily - after meals and at bedtime. Give 5 ml by mouth after meals and at bedtime for ABT for 10 Days MAGIC MOUTHWASH.   Menthol , Topical Analgesic, 4 % GEL Apply 1 application  topically as needed. Resident may self administer and keep in room   mirtazapine  (REMERON ) 45 MG tablet Take 1 tablet (45 mg total) by mouth at bedtime.   Multiple  Vitamins-Minerals (PRESERVISION AREDS 2+MULTI VIT PO)  Take 1 tablet by mouth 2 (two) times daily.   NIFEdipine  (PROCARDIA  XL/NIFEDICAL XL) 60 MG 24 hr tablet Take 60 mg by mouth daily.   nystatin  (MYCOSTATIN /NYSTOP ) powder Apply 1 Application topically as needed (rash). Apply to groin, and breast topically one time a day for redness   pantoprazole  (PROTONIX ) 40 MG tablet One daily to reduce stomach acid   polyethylene glycol (MIRALAX  / GLYCOLAX ) 17 g packet Take 17 g by mouth daily. Mix 17g  with 8 oz. of fluid daily for constipation   Povidone (IVIZIA DRY EYES OP) Place 2 drops into both eyes 4 (four) times daily.   pravastatin  (PRAVACHOL ) 10 MG tablet Take 10 mg by mouth daily.   hydrocortisone  1 % ointment Apply 1 Application topically 2 (two) times daily. Apply to perineum (Patient not taking: Reported on 06/12/2024)   NIFEdipine  (ADALAT  CC) 60 MG 24 hr tablet Take 60 mg by mouth daily. (Patient not taking: Reported on 06/12/2024)   No facility-administered encounter medications on file as of 06/12/2024.    Review of Systems  Vitals:   06/12/24 1542  BP: 112/68  Pulse: 75  Temp: (!) 97.4 F (36.3 C)  SpO2: 95%  Weight: 131 lb 6.4 oz (59.6 kg)  Height: 5' 2 (1.575 m)   Body mass index is 24.03 kg/m. Physical Exam  Labs reviewed: Basic Metabolic Panel: Recent Labs    05/27/24 0237 05/28/24 0506 05/29/24 0235  NA 134* 136 135  K 4.3 3.8 3.6  CL 101 101 102  CO2 23 25 24   GLUCOSE 110* 90 112*  BUN 33* 28* 26*  CREATININE 1.19* 0.96 0.78  CALCIUM  9.5 9.2 9.1  MG  --   --  2.1   Liver Function Tests: Recent Labs    07/31/23 0000 05/26/24 1649  AST 14 303*  ALT 8 114*  ALKPHOS 67 96  BILITOT  --  0.5  PROT  --  7.3  ALBUMIN 3.9 3.7   No results for input(s): LIPASE, AMYLASE in the last 8760 hours. No results for input(s): AMMONIA in the last 8760 hours. CBC: Recent Labs    05/27/24 0237 05/28/24 0506 05/29/24 0235 06/04/24 0000  WBC 14.0* 9.0 6.4  6.1  NEUTROABS  --   --  5.0  --   HGB 10.5* 10.1* 9.7* 10.0*  HCT 32.8* 30.8* 28.9* 32*  MCV 93.4 91.7 90.6  --   PLT 126* 107* 121*  --    Cardiac Enzymes: No results for input(s): CKTOTAL, CKMB, CKMBINDEX, TROPONINI in the last 8760 hours. BNP: Invalid input(s): POCBNP CBG: No results for input(s): GLUCAP in the last 8760 hours.  Procedures and Imaging Studies During Stay: DG Swallowing Func-Speech Pathology Result Date: 05/28/2024 Table formatting from the original result was not included. Modified Barium Swallow Study Patient Details Name: Nainika Newlun MRN: [id number] Date of Birth: 08-Jul-1925 Today's Date: 05/28/2024 HPI/PMH: HPI: Pt is 89 year old [facility]  [facility] for fall and AMS. Admitted with UTI. CT C/A/P shows a large hiatal hernia but no focal consolidation or pulmonary edema. PMH: HTN, osteoporosis,DDD, depression, hip fx, GERD Clinical Impression: Pt exhibits mild pharyngeal dysphagia characterized by occasional mistiming and decreased BOT retraction. Epiglottic inversion and laryngeal elevation are complete. Timing becomes more disorganized with larger boluses and the bolus pools in the pyriform sinuses before the swallow. This resulted in one instance of sensed aspiration with thin liquids as the bolus spilled over the pyriform sinuses before the laryngeal vestibule closes completely (PAS  7). Trace pharyngeal residue is suspected to be contributed to by reduced BOT retraction in addition to chronic degenerative cervical spine changes (see CT) and a prominent cricopharyngeus. The 13 mm barium tablet was given with purees, resulting in distal esophageal retention. Anticipate episodic aspiration of trace amounts (which is considered a mild impairment) and pt does not exhibit significant factors that may increase risk in its presence. Continue current diet with meds given whole in puree. Encourage cueing to control the rate/volume of sips of liquid and use of  frequent oral care. Will f/u for ongoing education with pt and her family.  DIGEST Swallow Severity Rating*             Safety: 1             Efficiency: 1             Overall Pharyngeal Swallow Severity: 1 (mild) 1: mild; 2: moderate; 3: severe; 4: profound *The Dynamic Imaging Grade of Swallowing Toxicity is standardized for the head and neck cancer population, however, demonstrates promising clinical applications across populations to standardize the clinical rating of pharyngeal swallow safety and severity. Factors that may increase risk of adverse event in presence of aspiration Noe & Lianne 2021): Factors that may increase risk of adverse event in presence of aspiration Noe & Lianne 2021): Reduced cognitive function; Limited mobility; Frail or deconditioned Recommendations/Plan: Swallowing Evaluation Recommendations Swallowing Evaluation Recommendations Recommendations: PO diet PO Diet Recommendation: Regular; Thin liquids (Level 0) Liquid Administration via: Cup; Straw Medication Administration: Whole meds with puree Supervision: Full assist for feeding; Full supervision/cueing for swallowing strategies Swallowing strategies  : Slow rate; Small bites/sips Postural changes: Position pt fully upright for meals; Stay upright 30-60 min after meals Oral care recommendations: Oral care BID (2x/day) Recommended consults: Consider Palliative care Treatment Plan Treatment Plan Treatment recommendations: Defer treatment plan to SLP at other venue (see follow-up recommendations) Follow-up recommendations: Skilled nursing-short term rehab (<3 hours/day) Functional status assessment: Patient has had a recent decline in their functional status and demonstrates the ability to make significant improvements in function in a reasonable and predictable amount of time. Recommendations Recommendations for follow up therapy are one component of a multi-disciplinary discharge planning process, led by the attending  physician.  Recommendations may be updated based on patient status, additional functional criteria and insurance authorization. Assessment: Orofacial Exam: Orofacial Exam Oral Cavity: Oral Hygiene: Xerostomia Oral Cavity - Dentition: Adequate natural dentition Orofacial Anatomy: WFL Oral Motor/Sensory Function: WFL Anatomy: Anatomy: Suspected cervical osteophytes; Prominent cricopharyngeus Boluses Administered: Boluses Administered Boluses Administered: Thin liquids (Level 0); Mildly thick liquids (Level 2, nectar thick); Moderately thick liquids (Level 3, honey thick); Puree; Solid  Oral Impairment Domain: Oral Impairment Domain Lip Closure: No labial escape Tongue control during bolus hold: Cohesive bolus between tongue to palatal seal Bolus preparation/mastication: Timely and efficient chewing and mashing Bolus transport/lingual motion: Brisk tongue motion Oral residue: Complete oral clearance Location of oral residue : N/A Initiation of pharyngeal swallow : Pyriform sinuses  Pharyngeal Impairment Domain: Pharyngeal Impairment Domain Soft palate elevation: No bolus between soft palate (SP)/pharyngeal wall (PW) Laryngeal elevation: Complete superior movement of thyroid  cartilage with complete approximation of arytenoids to epiglottic petiole Anterior hyoid excursion: Complete anterior movement Epiglottic movement: Complete inversion Laryngeal vestibule closure: Complete, no air/contrast in laryngeal vestibule Pharyngeal stripping wave : Present - complete Pharyngeal contraction (A/P view only): N/A Pharyngoesophageal segment opening: Partial distention/partial duration, partial obstruction of flow Tongue base retraction: Wide column of contrast or air  between tongue base and PPW Pharyngeal residue: Trace residue within or on pharyngeal structures Location of pharyngeal residue: Tongue base; Valleculae; Pyriform sinuses  Esophageal Impairment Domain: Esophageal Impairment Domain Esophageal clearance upright  position: Esophageal retention Pill: Pill Consistency administered: Puree Puree: WFL Penetration/Aspiration Scale Score: Penetration/Aspiration Scale Score 1.  Material does not enter airway: Moderately thick liquids (Level 3, honey thick); Puree; Solid; Pill 2.  Material enters airway, remains ABOVE vocal cords then ejected out: Mildly thick liquids (Level 2, nectar thick) 7.  Material enters airway, passes BELOW cords and not ejected out despite cough attempt by patient: Thin liquids (Level 0) Compensatory Strategies: Compensatory Strategies Compensatory strategies: No   General Information: Caregiver present: No  Diet Prior to this Study: Regular; Thin liquids (Level 0)   Temperature : Normal   Respiratory Status: WFL   Supplemental O2: None (Room air)   History of Recent Intubation: No  Behavior/Cognition: Alert; Cooperative; Requires cueing Self-Feeding Abilities: Needs assist with self-feeding Baseline vocal quality/speech: Normal Volitional Cough: Able to elicit Volitional Swallow: Able to elicit Exam Limitations: No limitations Goal Planning: Prognosis for improved oropharyngeal function: Fair Barriers to Reach Goals: Cognitive deficits; Time post onset No data recorded Patient/Family Stated Goal: none stated Consulted and agree with results and recommendations: Patient; Family member/caregiver Pain: Pain Assessment Pain Assessment: Faces Faces Pain Scale: 0 Pain Location: all over Pain Descriptors / Indicators: Grimacing; Moaning Pain Intervention(s): Monitored during session End of Session: Start Time:SLP Start Time (ACUTE ONLY): 1017 Stop Time: SLP Stop Time (ACUTE ONLY): 1032 Time Calculation:SLP Time Calculation (min) (ACUTE ONLY): 15 min Charges: SLP Evaluations $ SLP Speech Visit: 1 Visit SLP Evaluations $BSS Swallow: 1 Procedure $MBS Swallow: 1 Procedure SLP visit diagnosis: SLP Visit Diagnosis: Dysphagia, pharyngeal phase (R13.13) Past Medical History: Past Medical History: Diagnosis Date  Abnormal  mammogram, unspecified 04/19/2011  Allergy   Anxiety   Backache, unspecified 01/21/2005  Cervicalgia 08/20/2013  Chronic kidney disease, stage II (mild) 04/03/2009  Chronic rhinitis 09/28/2010  DDD (degenerative disc disease)   Depression   Dermatophytosis of nail 01/21/2005  Diverticulosis of colon (without mention of hemorrhage) 06/17/2005  Edema 11/26/2004  GERD (gastroesophageal reflux disease)   Hiatal hernia   High cholesterol   Hyperglycemia 11/18/2014  Hypertension   Insomnia, unspecified 10/18/2011  Left inguinal hernia 04/24/2014  Nocturia 09/19/2008  Osteoporosis, senile 11/18/2014  Other abnormal blood chemistry 04/03/2007  Pain in joint, pelvic region and thigh 11/12/2011  Pain in joint, shoulder region 08/15/2006  Pain in limb 08/26/2005  Palpitations 09/29/2009  PAOD (peripheral arterial occlusive disease) 11/18/2014  Right leg; diminished popliteal, dorsalis pedis, and posterior tibial artery pulsations   Postmenopausal atrophic vaginitis 12/14/2004  Scoliosis   Senile osteoporosis 12/14/2004  Tension headache 10/04/2005  Unspecified cataract 11/26/2004  Unspecified constipation 01/21/2005  Unspecified urinary incontinence 11/26/2004  Vaginal stenosis 11/18/2014  Tight band about 1/2 inches into the vagina which will not allow passage of my index finger.  Past Surgical History: Past Surgical History: Procedure Laterality Date  ABDOMINAL HYSTERECTOMY  1969  Dr.Braun   bilateral eyelid surgery  2005  Dr.Scott  BILATERAL OOPHORECTOMY  2002  BREAST SURGERY    benign tumor removal, left breast 1969  CATARACT EXTRACTION    Right  CHOLECYSTECTOMY  1990  Dr.Moore   COLONOSCOPY  3/18/20008  inflammation, diverticular associated colitis -Dr. Teressa  COSMETIC SURGERY    ESOPHAGOGASTRODUODENOSCOPY N/A 01/24/2014  Procedure: ESOPHAGOGASTRODUODENOSCOPY (EGD);  Surgeon: Norleen LOISE Kiang, MD;  Location: [facility] ENDOSCOPY;  Service: Endoscopy;  Laterality:  N/A;  ESOPHAGOGASTRODUODENOSCOPY ENDOSCOPY  04/04/14  Dr. Teressa  EYE SURGERY    FOREIGN BODY REMOVAL N/A  01/24/2014  Procedure: FOREIGN BODY REMOVAL;  Surgeon: Norleen LOISE Kiang, MD;  Location: [facility] ENDOSCOPY;  Service: Endoscopy;  Laterality: N/A;  HIP PINNING,CANNULATED  11/07/2011  Procedure: CANNULATED HIP PINNING;  Surgeon: Lynwood FORBES Better, MD;  Location: [facility] ORS;  Service: Orthopedics;  Laterality: Left;  ORIF FEMORAL NECK FRACTURE W/ [facility]  11/07/2011  Dr.Nitka   TONSILLECTOMY    Dr.Joe Claudene  TUBAL LIGATION    VAGINAL PROLAPSE REPAIR  2002  Dr.Horback  Damien Blumenthal, M.A., CCC-SLP Speech Language Pathology, Acute Rehabilitation Services Secure Chat preferred [phone] 05/28/2024, 11:22 AM  CT CERVICAL SPINE WO CONTRAST Result Date: 05/26/2024 EXAM: CT CERVICAL SPINE WITHOUT CONTRAST 05/26/2024 07:11:31 PM TECHNIQUE: CT of the cervical spine was performed without the administration of intravenous contrast. Multiplanar reformatted images are provided for review. Automated exposure control, iterative reconstruction, and/or weight based adjustment of the mA/kV was utilized to reduce the radiation dose to as low as reasonably achievable. COMPARISON: None available. CLINICAL HISTORY: Polytrauma, blunt. FINDINGS: BONES AND ALIGNMENT: 3 mm anterolisthesis C7-T1, likely degenerative in nature. Degenerative ankylosis C4-C5. Ankylosis of the facet joints of C3-C5 bilaterally. No acute fracture or traumatic listhesis. Motion degraded examination. DEGENERATIVE CHANGES: Advanced degenerative disc disease throughout the cervical spine with disc space narrowing and endplate remodeling. 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 Multilevel uncovertebral and facet arthrosis results in multilevel moderate-to-severe neuroforaminal narrowing, most severe at C5-C6 and C6-C7. SOFT TISSUES: No prevertebral soft tissue swelling. IMPRESSION: 1. No acute fracture or traumatic listhesis. 2. Motion degraded examination. 3. 3 mm anterolisthesis at C7-T1, likely  degenerative. 4. Advanced degenerative disc disease throughout the cervical spine with disc space narrowing and endplate remodeling. 5. Ankylosis of the facet joints of C3-C5 bilaterally. 6. Degenerative ankylosis at C4-5. 7. Moderate central canal stenosis at C4-5 secondary to posterior disc osteophyte complex and congenital narrowing of the pedicles. 8. Multilevel moderate-to-severe neuroforaminal narrowing, most severe at C5-6 and C6-7. Electronically signed by: Dorethia Molt MD MD 05/26/2024 07:50 PM [facility] Workstation: [id number]   CT CHEST ABDOMEN PELVIS WO CONTRAST Result Date: 05/26/2024 EXAM: CT CHEST, ABDOMEN AND PELVIS WITHOUT CONTRAST 05/26/2024 07:11:31 PM TECHNIQUE: CT of the chest, abdomen and pelvis was performed without the administration of intravenous contrast. Multiplanar reformatted images are provided for review. Automated exposure control, iterative reconstruction, and/or weight based adjustment of the mA/kV was utilized to reduce the radiation dose to as low as reasonably achievable. COMPARISON: 04/20/2014 CLINICAL HISTORY: Polytrauma, blunt. Multiple unwitnessed fall, bruising, altered mental status. FINDINGS: CHEST: MEDIASTINUM AND LYMPH NODES: Heart and pericardium are unremarkable. Extensive multivessel coronary artery calcification. Extensive calcification of the aortic valve leaflets. The central pulmonary arteries are enlarged in keeping with changes of pulmonary arterial hypertension. Moderate atherosclerotic calcification within the thoracic aorta. 22 mm nodule within the left thyroid  lobe, not well assessed this examination. No follow-up imaging recommended unless clinically indicated. The central airways are clear. No mediastinal, hilar or axillary lymphadenopathy. LUNGS AND PLEURA: Large hiatal hernia, and large since prior examination, with the entire stomach, distal pancreas and the distal transverse and proximal descending colon within the thoracic compartment. 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 No focal consolidation or pulmonary edema. No pleural effusion. No pneumothorax. ABDOMEN AND PELVIS: LIVER: Unremarkable. GALLBLADDER AND BILE DUCTS: Status post cholecystectomy. No biliary ductal dilatation. SPLEEN: No acute  abnormality. PANCREAS: No acute abnormality. ADRENAL GLANDS: No acute abnormality. KIDNEYS, URETERS AND BLADDER: Multiple nonobstructing calculi are seen within the right renal pelvis measuring 4 and 10 mm. There is superimposed mild asymmetric right perinephric and peripelvic stranding. Perinephric and peripelvic stranding from the right may reflect residual of a recently passed renal calculus. Alternatively, inflammatory condition such as an ascending urinary tract infection could appear similarly. Correlation with urinalysis and urine culture may be helpful for further management. No hydronephrosis. The left kidney is unremarkable; no intrarenal or ureteral calculi are identified and there is no hydronephrosis. Several granular dependently layering calcifications are seen within the bladder lumen measuring 2-3 mm in size. The bladder is partially decompressed. There is, however, circumferential bladder wall thickening noted which may reflect changes of underlying cystitis or chronic bladder outlet obstruction. GI AND BOWEL: Severe descending and sigmoid colonic diverticulosis without superimposed acute inflammatory change. The visualized bowel is otherwise unremarkable. No evidence of obstruction. Appendix normal. REPRODUCTIVE ORGANS: Status post hysterectomy. No acute abnormality. PERITONEUM AND RETROPERITONEUM: No ascites. No free air. VASCULATURE: Aorta is normal in caliber. Moderate aortoiliac atherosclerotic calcification. ABDOMINAL AND PELVIS LYMPH NODES: No lymphadenopathy. BONES AND SOFT TISSUES: Left hip pinning has been performed. Severe lumbar dextroscoliosis. Osseous structures are age appropriate with advanced degenerative  changes seen within the thoracolumbar spine. No focal soft tissue abnormality. Had some masses. IMPRESSION: 1. Large hiatal hernia with organoaxial gastric volvulus without evidence of obstruction. 2. Multiple nonobstructing calculi in the right renal pelvis with mild asymmetric right perinephric and peripelvic stranding, which may reflect residual of a recently passed renal calculus or an ascending urinary tract infection; urinalysis and urine culture may be helpful for further management. 3. Circumferential bladder wall thickening, which may reflect cystitis or chronic bladder outlet obstruction. 4. Severe descending and sigmoid colonic diverticulosis without acute inflammatory change. 5. Enlarged central pulmonary arteries, in keeping with pulmonary arterial hypertension. 6. Extensive multivessel coronary artery calcification and extensive calcification of the aortic valve leaflets. 7. Left thyroid  nodule measuring 22 mm, not well assessed on this examination; no follow-up imaging recommended unless clinically indicated. 8. Severe lumbar dextroscoliosis with advanced degenerative changes of the thoracolumbar spine. 9. Raf score includes aortic atherosclerosis (ICD10-I70.0). Electronically signed by: Dorethia Molt MD MD 05/26/2024 07:45 PM [facility] Workstation: [id number]   CT T-SPINE NO CHARGE Result Date: 05/26/2024 EXAM: CT THORACIC SPINE WITHOUT CONTRAST 05/26/2024 07:11:31 PM TECHNIQUE: CT of the thoracic spine was performed without the administration of intravenous contrast. Multiplanar reformatted images are provided for review. Automated exposure control, iterative reconstruction, and/or weight based adjustment of the mA/kV was utilized to reduce the radiation dose to as low as reasonably achievable. COMPARISON: None available. CLINICAL HISTORY: FINDINGS: BONES AND ALIGNMENT: Normal vertebral body heights. 2 mm anterolisthesis T2-3, likely degenerative in nature. No acute fracture or traumatic listhesis  of the thoracic spine. No suspicious bone lesion. DEGENERATIVE CHANGES: Intervertebral disc space narrowing and endplate remodeling throughout the thoracic spine in keeping with changes of moderate-to-severe degenerative disc disease. No high-grade canal stenosis. Moderate bilateral neuroforaminal narrowing at T8-9. No high-grade neural foraminal narrowing identified. SOFT TISSUES: Left lower lobe consolidation, better assessed on accompanying CT examination of the chest, abdomen, and pelvis. Please refer to that report for further details. IMPRESSION: 1. No acute fracture or traumatic listhesis of the thoracic spine. 2. Left lower lobe consolidation. 3. Moderate-to-severe degenerative disc disease throughout the thoracic spine, without high-grade canal stenosis. 4. Moderate bilateral neuroforaminal narrowing at T8-9. 5. Minimal anterolisthesis at T2-3,  likely degenerative. Electronically signed by: Dorethia Molt MD MD 05/26/2024 07:33 PM [facility] Workstation: [id number]   CT L-SPINE NO CHARGE Result Date: 05/26/2024 EXAM: CT OF THE LUMBAR SPINE WITHOUT CONTRAST 05/26/2024 07:11:31 PM TECHNIQUE: CT of the lumbar spine was performed without the administration of intravenous contrast. Multiplanar reformatted images are provided for review. Automated exposure control, iterative reconstruction, and/or weight based adjustment of the mA/kV was utilized to reduce the radiation dose to as low as reasonably achievable. COMPARISON: None available. CLINICAL HISTORY: FINDINGS: BONES AND ALIGNMENT: Normal vertebral body heights. No acute fracture or suspicious bone lesion. No traumatic listhesis. Severe lumbar dextroscoliosis, apex right at L3. Mild loss of the normal lumbar lordosis. There is degenerative ankylosis of the L2 and L3 vertebral bodies along the left lateral aspect. DEGENERATIVE CHANGES: There is diffuse intervertebral disc space narrowing, endplate remodeling, and vacuum disc phenomena throughout the lumbar spine  in keeping with changes of severe degenerative disc disease. Multifactorial moderate canal stenosis at L3-4 and L4-5. Severe right neuroforaminal narrowing at L4-5 and L5-S1 secondary to disc height loss and osteophytic ridging and facet arthrosis. Moderate-to-severe left neuroforaminal narrowing at L3-4 and L5-S1. SOFT TISSUES: Multiple nonobstructing calculi within the right kidney measuring up to 11 mm. Moderate aortoiliac atherosclerotic calcification. Severe sigmoid diverticulosis noted. IMPRESSION: 1. No acute fracture or traumatic listhesis. 2. Severe lumbar dextroscoliosis, apex right at L3, with mild loss of the normal lumbar lordosis. 3. Severe degenerative disc disease with diffuse intervertebral disc space narrowing, endplate remodeling, and vacuum disc phenomena throughout the lumbar spine, with degenerative ankylosis of the L2 and L3 vertebral bodies along the left lateral aspect. 4. Multifactorial moderate canal stenosis at L3-4 and L4-5. 5. Severe right neuroforaminal narrowing at L4-5 and L5-S1, with moderate-to-severe left neuroforaminal narrowing at L3-4 and L5-S1. 6. Multiple nonobstructing right renal calculi measuring up to 11 mm. 7. Moderate aortoiliac atherosclerotic calcification. 8. Severe sigmoid diverticulosis. Electronically signed by: Dorethia Molt MD MD 05/26/2024 07:30 PM [facility] Workstation: [id number]   CT HEAD WO CONTRAST Result Date: 05/26/2024 EXAM: CT HEAD WITHOUT CONTRAST 05/26/2024 07:11:31 PM TECHNIQUE: CT of the head was performed without the administration of intravenous contrast. Automated exposure control, iterative reconstruction, and/or weight based adjustment of the mA/kV was utilized to reduce the radiation dose to as low as reasonably achievable. COMPARISON: None available. CLINICAL HISTORY: Head trauma, moderate-severe. FINDINGS: BRAIN AND VENTRICLES: No acute hemorrhage. No evidence of acute infarct. No hydrocephalus. No extra-axial collection. No mass effect or  midline shift. Cerebral volume loss. Moderate patchy supratentorial white matter hypodensities. ORBITS: No acute abnormality. SINUSES: Moderate mucosal thickening in left ethmoid sinus. SOFT TISSUES AND SKULL: No acute soft tissue abnormality. No skull fracture. VASCULATURE: Atherosclerosis. LIMITATIONS/ARTIFACTS: Motion artifact. IMPRESSION: 1. No acute intracranial abnormality. 2. Chronic cerebral volume loss and moderate chronic supratentorial white matter change. 3. Atherosclerosis. Electronically signed by: Dorethia Molt MD MD 05/26/2024 07:17 PM [facility] Workstation: [id number]    Assessment/Plan:   There are no diagnoses linked to this encounter.   Patient is being discharged with the following home health services:    Patient is being discharged with the following durable medical equipment:    Patient has been advised to f/u with their PCP in 1-2 weeks to for a transitions of care visit.  Social services at their facility was responsible for arranging this appointment.  Pt was provided with adequate prescriptions of noncontrolled medications to reach the scheduled appointment .  For controlled substances, a limited supply was provided as  appropriate for the individual patient.  If the pt normally receives these medications from a pain clinic or has a contract with another physician, these medications should be received from that clinic or physician only).    Future labs/tests needed:  ***      [1]  Allergies Allergen Reactions   Brexpiprazole Swelling   Lamictal [Lamotrigine] Swelling    Lip swelling   Penicillins Other (See Comments)    CHILDHOOD REACTION   Abilify [Aripiprazole] Rash  [2]  Allergies Allergen Reactions   Brexpiprazole Swelling   Lamictal [Lamotrigine] Swelling    Lip swelling   Penicillins Other (See Comments)    CHILDHOOD REACTION   Abilify [Aripiprazole] Rash   "

## 2024-06-12 NOTE — Assessment & Plan Note (Signed)
"   Lower back pain, taking Tylenol ,  ambulates with walker prior to hospitalization.  "

## 2024-06-12 NOTE — Assessment & Plan Note (Signed)
 takes Pravastatin . LDL 82 06/04/24

## 2024-06-12 NOTE — Assessment & Plan Note (Signed)
 Hgb 10, Vit B12 756 06/04/24

## 2024-06-12 NOTE — Assessment & Plan Note (Signed)
 blood pressure is controlled, on Nifedipine , ASA, Bun/creat 26/0.78 05/29/24

## 2024-06-12 NOTE — Assessment & Plan Note (Signed)
 on Senokot, MiraLax , lubiprostone 

## 2024-06-12 NOTE — Assessment & Plan Note (Signed)
 Anxiety seen by Psych, off Clonazepam , resumed Lorazepam , on Hydroxyzine .              Depression: takes Mirtazapine , Lexapro , Wellbutrin , hydroxyzine , off clonazepam , resumed Lorazepam , TSH 1.62, Vit B12 756 06/04/24

## 2024-06-12 NOTE — Assessment & Plan Note (Signed)
 Hgb A1c 5.9 06/04/24

## 2024-06-12 NOTE — Assessment & Plan Note (Signed)
stable, on Pantoprazole  

## 2024-06-12 NOTE — Progress Notes (Unsigned)
 " Location:   [facility] Room Number: 40A Place of Service:  SNF (31)  Provider: Larwance Hark NP  PCP: [name], MD Patient Care Team: [name], MD as PCP - General (Internal Medicine) Devora, [facility] Octavia Charleston, MD as Consulting Physician (Ophthalmology) Teressa Toribio SQUIBB, MD (Inactive) as Consulting Physician (Gastroenterology) Tasia Lung, MD as Consulting Physician (Psychiatry) Lucilla Lynwood BRAVO, MD (Inactive) as Consulting Physician (Orthopedic Surgery) Gaston Hamilton, MD as Consulting Physician (Urology)  Extended Emergency Contact Information Primary Emergency Contact: Ahnna, Dun[place], [name] [place] Home Phone: [phone] Mobile Phone: [phone] Relation: Son Secondary Emergency Contact: [name],Worth Mobile Phone: [phone] Relation: Son  Code Status: DNR Goals of care:  Advanced Directive information    05/27/2024    3:47 PM  Advanced Directives  Type of Advance Directive Healthcare Power of Attorney  Does patient want to make changes to medical advance directive? No - Patient declined     Allergies[1]  Chief Complaint  Patient presents with   Acute Visit    [place]    HPI:  89 y.o. female with medical history significant for GERD, constipation, OA/lower back pain, HTN, chronic anemia, anxiety/depression, HLD, urinary incontinence, and prediabetes was admitted to [facility] for therapy following hospital stay.   Hospitalized 03/26/2025 to 03/30/2025 for acute cystitis with hematuria-fully treated in hospital-complete 2 more days of cefadroxil  in SNF, s/p fall, generalized weakness, resolved acute metabolic encephalopathy and AKI. Her SNF [facility] stay was complicated with Sialadenitis R sided and rash, fully treated and resolved.  ` 06/07/24 rash under the right breast, medical aspect of R+L inner thighs, treated with Nystatin .              Sialadenitis of the right,  resolved a painful firm lump under the left mandible, smooth, no coating, slight reddened tongue, treated with Clindamycin  The patient is discharging from SNF [facility] to an AL setting with one on one care setting, she is near total care for her ADLs.  Hx of vaginal bleeding/spoting on and off since the prolapsed vagina repair due to the surgical scar tissue. declined GYN f/u.              GERD, stable, on Pantoprazole              Constipation, on Senokot, MiraLax , lubiprostone              Lower back pain, taking Tylenol ,  ambulates with walker prior to hospitalization.              HTN, blood pressure is controlled, on Nifedipine , ASA, Bun/creat 26/0.78 05/29/24             Chronic anemia, Hgb 10, Vit B12 756 06/04/24             Anxiety seen by Psych, off Clonazepam , resumed Lorazepam , on Hydroxyzine .              Depression: takes Mirtazapine , Lexapro , Wellbutrin , hydroxyzine , off clonazepam , resumed Lorazepam , TSH 1.62, Vit B12 756 06/04/24             Dizziness, on and off, chronic, seen by Neurology,  MRI brain-Patient declined it.              Hyperlipidemia, takes Pravastatin . LDL 82 06/04/24             Urinary incontinent, more at night.  Prediabetes Hgb A1c 5.9 06/04/24    Past Medical History:  Diagnosis Date   Abnormal mammogram, unspecified 04/19/2011   Allergy    Anxiety    Backache, unspecified 01/21/2005   Cervicalgia 08/20/2013   Chronic kidney disease, stage II (mild) 04/03/2009   Chronic rhinitis 09/28/2010   DDD (degenerative disc disease)    Depression    Dermatophytosis of nail 01/21/2005   Diverticulosis of colon (without mention of hemorrhage) 06/17/2005   Edema 11/26/2004   GERD (gastroesophageal reflux disease)    Hiatal hernia    High cholesterol    Hyperglycemia 11/18/2014   Hypertension    Insomnia, unspecified 10/18/2011   Left inguinal hernia 04/24/2014   Nocturia 09/19/2008   Osteoporosis, senile 11/18/2014   Other abnormal blood chemistry 04/03/2007   Pain in  joint, pelvic region and thigh 11/12/2011   Pain in joint, shoulder region 08/15/2006   Pain in limb 08/26/2005   Palpitations 09/29/2009   PAOD (peripheral arterial occlusive disease) 11/18/2014   Right leg; diminished popliteal, dorsalis pedis, and posterior tibial artery pulsations    Postmenopausal atrophic vaginitis 12/14/2004   Scoliosis    Senile osteoporosis 12/14/2004   Tension headache 10/04/2005   Unspecified cataract 11/26/2004   Unspecified constipation 01/21/2005   Unspecified urinary incontinence 11/26/2004   Vaginal stenosis 11/18/2014   Tight band about 1/2 inches into the vagina which will not allow passage of my index finger.     Past Surgical History:  Procedure Laterality Date   ABDOMINAL HYSTERECTOMY  1969   Dr.Braun    bilateral eyelid surgery  2005   Dr.Scott   BILATERAL OOPHORECTOMY  2002   BREAST SURGERY     benign tumor removal, left breast 1969   CATARACT EXTRACTION     Right   CHOLECYSTECTOMY  1990   Dr.Moore    COLONOSCOPY  3/18/20008   inflammation, diverticular associated colitis -Dr. Teressa   COSMETIC SURGERY     ESOPHAGOGASTRODUODENOSCOPY N/A 01/24/2014   Procedure: ESOPHAGOGASTRODUODENOSCOPY (EGD);  Surgeon: Norleen LOISE Kiang, MD;  Location: [facility] ENDOSCOPY;  Service: Endoscopy;  Laterality: N/A;   ESOPHAGOGASTRODUODENOSCOPY ENDOSCOPY  04/04/14   Dr. Teressa   EYE SURGERY     FOREIGN BODY REMOVAL N/A 01/24/2014   Procedure: FOREIGN BODY REMOVAL;  Surgeon: Norleen LOISE Kiang, MD;  Location: [facility] ENDOSCOPY;  Service: Endoscopy;  Laterality: N/A;   HIP PINNING,CANNULATED  11/07/2011   Procedure: CANNULATED HIP PINNING;  Surgeon: Lynwood FORBES Better, MD;  Location: [facility] ORS;  Service: Orthopedics;  Laterality: Left;   ORIF FEMORAL NECK FRACTURE W/ [facility]  11/07/2011   Dr.Nitka    TONSILLECTOMY     Dr.Joe Claudene   TUBAL LIGATION     VAGINAL PROLAPSE REPAIR  2002   Dr.Horback       reports that she has never smoked. She has never used smokeless tobacco. She reports that she does not  currently use alcohol . She reports that she does not use drugs. Social History   Socioeconomic History   Marital status: Widowed    Spouse name: Not on file   Number of children: Not on file   Years of education: Not on file   Highest education level: Not on file  Occupational History   Occupation: retired Midwife: RETIRED  Tobacco Use   Smoking status: Never   Smokeless tobacco: Never  Vaping Use   Vaping status: Never Used  Substance and Sexual Activity   Alcohol  use: Not Currently  Alcohol /week: 0.0 standard drinks of alcohol     Comment: one glass of wine in evening   Drug use: No   Sexual activity: Never  Other Topics Concern   Not on file  Social History Narrative   Lives at [facility] since 04/14/2005   Widowed (husband expired 2014)   Has Living Will, POA, MOST   Exercise: water aerobic  5 times a week   Walks with walker   Never smoked   Drinks moderate amount of caffeinate beverages daily   Alcohol  none      Social Drivers of Health   Tobacco Use: Low Risk (06/13/2024)   Patient History    Smoking Tobacco Use: Never    Smokeless Tobacco Use: Never    Passive Exposure: Not on file  Financial Resource Strain: Low Risk (08/11/2023)   Overall Financial Resource Strain (CARDIA)    Difficulty of Paying Living Expenses: Not hard at all  Food Insecurity: No Food Insecurity (05/27/2024)   Epic    Worried About Programme Researcher, Broadcasting/film/video in the Last Year: Never true    Ran Out of Food in the Last Year: Never true  Transportation Needs: No Transportation Needs (05/27/2024)   Epic    Lack of Transportation (Medical): No    Lack of Transportation (Non-Medical): No  Physical Activity: Insufficiently Active (08/11/2023)   Exercise Vital Sign    Days of Exercise per Week: 3 days    Minutes of Exercise per Session: 10 min  Stress: Stress Concern Present (08/11/2023)   [employer] of Occupational Health - Occupational Stress Questionnaire     Feeling of Stress : Very much  Social Connections: Socially Isolated (08/11/2023)   Social Connection and Isolation Panel    Frequency of Communication with Friends and Family: Three times a week    Frequency of Social Gatherings with Friends and Family: Three times a week    Attends Religious Services: Never    Active Member of Clubs or Organizations: No    Attends Banker Meetings: Never    Marital Status: Widowed  Intimate Partner Violence: Not At Risk (08/11/2023)   Humiliation, Afraid, Rape, and Kick questionnaire    Fear of Current or Ex-Partner: No    Emotionally Abused: No    Physically Abused: No    Sexually Abused: No  Depression (PHQ2-9): High Risk (01/29/2024)   Depression (PHQ2-9)    PHQ-2 Score: 16  Alcohol  Screen: Low Risk (08/11/2023)   Alcohol  Screen    Last Alcohol  Screening Score (AUDIT): 0  Housing: Low Risk (05/27/2024)   Epic    Unable to Pay for Housing in the Last Year: No    Number of Times Moved in the Last Year: 0    Homeless in the Last Year: No  Utilities: Not At Risk (08/11/2023)   [facility] Utilities    Threatened with loss of utilities: No  Health Literacy: Not on file   Functional Status Survey:    Allergies[2]  Pertinent  Health Maintenance Due  Topic Date Due   Influenza Vaccine  Completed   Bone Density Scan  Completed   Mammogram  Discontinued    Medications: Allergies as of 06/12/2024       Reactions   Brexpiprazole Swelling   Lamictal [lamotrigine] Swelling   Lip swelling   Penicillins Other (See Comments)   CHILDHOOD REACTION   Abilify [aripiprazole] Rash        Medication List        Accurate as  of June 12, 2024 11:59 PM. If you have any questions, ask your nurse or doctor.          acetaminophen  325 MG tablet Commonly known as: TYLENOL  Take 650 mg by mouth every 4 (four) hours as needed for fever.   antiseptic oral rinse Liqd 10 mLs by Mouth Rinse route as needed for dry mouth. Resident may self  administer and keep in room   aspirin  81 MG chewable tablet Chew 81 mg by mouth daily.   buPROPion  150 MG 24 hr tablet Commonly known as: WELLBUTRIN  XL Take 150 mg by mouth daily.   Calcium  Carb-Cholecalciferol  600-800 MG-UNIT Tabs Take 1 tablet by mouth 2 (two) times daily.   cholecalciferol  1000 units tablet Commonly known as: VITAMIN D  Take 1,000 Units by mouth daily.   clindamycin 300 MG capsule Commonly known as: CLEOCIN Take 300 mg by mouth 2 (two) times daily. Give 300 mg by mouth two times a day for Salivary Gland Infection for 7 Days   docusate sodium  100 MG capsule Commonly known as: Colace Take 1 capsule (100 mg total) by mouth every other day.   escitalopram  20 MG tablet Commonly known as: LEXAPRO  Take 1 tablet (20 mg total) by mouth daily.   GenTeal Severe 0.3 % Gel ophthalmic ointment Generic drug: hypromellose Place 1 Application into both eyes as needed for dry eyes.   Glucosamine HCl 500 MG Tabs Take 4 capsules by mouth 2 (two) times daily.   hydrocortisone  1 % ointment Apply 1 Application topically 2 (two) times daily. Apply to perineum   hydrocortisone  2.5 % cream Apply 1 Application topically See admin instructions. Apply to anal topically as needed for irriation as needed twice daily   hydrocortisone  cream 1 % Apply 1 Application topically 2 (two) times daily.   hydrOXYzine  10 MG tablet Commonly known as: ATARAX  Take 1 tablet (10 mg total) by mouth 2 (two) times daily.   IVIZIA DRY EYES OP Place 2 drops into both eyes 4 (four) times daily.   lactose free nutrition Liqd Take 237 mLs by mouth 3 (three) times daily between meals. Patient prefers vanilla serve at room temperature.   LORazepam  0.5 MG tablet Commonly known as: Ativan  Take 1 tablet (0.5 mg total) by mouth every 6 (six) hours.   lubiprostone  8 MCG capsule Commonly known as: AMITIZA  Take 8 mcg by mouth 2 (two) times daily with a meal.   magic mouthwash (nystatin ,  hydrocortisone , diphenhydrAMINE) suspension Swish and spit 5 mLs 4 (four) times daily - after meals and at bedtime. Give 5 ml by mouth after meals and at bedtime for ABT for 10 Days MAGIC MOUTHWASH.   Menthol  (Topical Analgesic) 4 % Gel Apply 1 application  topically as needed. Resident may self administer and keep in room   mirtazapine  45 MG tablet Commonly known as: REMERON  Take 1 tablet (45 mg total) by mouth at bedtime.   NAC 500 MG Caps Generic drug: Acetylcysteine Take 600 mg by mouth daily.   NIFEdipine  60 MG 24 hr tablet Commonly known as: ADALAT  CC Take 60 mg by mouth daily.   NIFEdipine  60 MG 24 hr tablet Commonly known as: PROCARDIA  XL/NIFEDICAL XL Take 60 mg by mouth daily.   nystatin  powder Commonly known as: MYCOSTATIN /NYSTOP  Apply 1 Application topically as needed (rash). Apply to groin, and breast topically one time a day for redness   pantoprazole  40 MG tablet Commonly known as: PROTONIX  One daily to reduce stomach acid   Pepto Bismol 525  MG/30ML suspension Generic drug: bismuth subsalicylate Take 30 mLs by mouth daily as needed for diarrhea or loose stools.   polyethylene glycol 17 g packet Commonly known as: MIRALAX  / GLYCOLAX  Take 17 g by mouth daily. Mix 17g  with 8 oz. of fluid daily for constipation   pravastatin  10 MG tablet Commonly known as: PRAVACHOL  Take 10 mg by mouth daily.   PRESERVISION AREDS 2+MULTI VIT PO Take 1 tablet by mouth 2 (two) times daily.        Review of Systems  Constitutional:  Positive for fatigue. Negative for fever and unexpected weight change.  HENT:  Positive for hearing loss. Negative for congestion and voice change.   Eyes:  Negative for visual disturbance.  Respiratory:  Negative for cough and shortness of breath.   Cardiovascular:  Positive for leg swelling. Negative for chest pain and palpitations.  Gastrointestinal:  Negative for abdominal pain and constipation.  Genitourinary:  Negative for difficulty  urinating, dysuria and urgency.       Incontinent of urine  Musculoskeletal:  Positive for arthralgias, back pain and gait problem.  Skin:  Negative for rash.  Neurological:  Positive for tremors and numbness. Negative for speech difficulty and headaches.       Right finger resting tremor mild, left fingers feels numb  Psychiatric/Behavioral:  Positive for dysphoric mood. Negative for behavioral problems, hallucinations and sleep disturbance. The patient is nervous/anxious.        Worries, fears    Vitals:   06/13/24 1210  BP: 112/68  Pulse: 75  Resp: 18  Temp: (!) 97.4 F (36.3 C)  SpO2: 95%   There is no height or weight on file to calculate BMI. Physical Exam Vitals and nursing note reviewed.  Constitutional:      Appearance: Normal appearance.  HENT:     Head: Normocephalic and atraumatic.     Mouth/Throat:     Mouth: Mucous membranes are moist.     Comments: Resolved a painful firm lump under the left mandible, smooth, no coating, slight reddened tongue.   Eyes:     Extraocular Movements: Extraocular movements intact.     Conjunctiva/sclera: Conjunctivae normal.     Pupils: Pupils are equal, round, and reactive to light.  Cardiovascular:     Rate and Rhythm: Normal rate and regular rhythm.     Heart sounds: Murmur heard.  Pulmonary:     Breath sounds: No rales.  Abdominal:     General: Bowel sounds are normal.     Palpations: Abdomen is soft.     Tenderness: There is no abdominal tenderness.  Genitourinary:    Rectum: Normal.     Comments: No internal or external hemorrhoids noted, no hard stools in the rectal vault.  Musculoskeletal:     Cervical back: Normal range of motion and neck supple.     Right lower leg: Edema present.     Left lower leg: Edema present.     Comments: Trace edema BLE. TED  Skin:    General: Skin is warm and dry.     Findings: Rash present.     Comments: rash under the right breast, medical aspect of R+L inner thighs.     Neurological:     General: No focal deficit present.     Mental Status: She is alert and oriented to person, place, and time. Mental status is at baseline.     Gait: Gait abnormal.  Psychiatric:     Comments: Anxious, repetitive.  Labs reviewed: Basic Metabolic Panel: Recent Labs    05/27/24 0237 05/28/24 0506 05/29/24 0235  NA 134* 136 135  K 4.3 3.8 3.6  CL 101 101 102  CO2 23 25 24   GLUCOSE 110* 90 112*  BUN 33* 28* 26*  CREATININE 1.19* 0.96 0.78  CALCIUM  9.5 9.2 9.1  MG  --   --  2.1   Liver Function Tests: Recent Labs    07/31/23 0000 05/26/24 1649  AST 14 303*  ALT 8 114*  ALKPHOS 67 96  BILITOT  --  0.5  PROT  --  7.3  ALBUMIN 3.9 3.7   No results for input(s): LIPASE, AMYLASE in the last 8760 hours. No results for input(s): AMMONIA in the last 8760 hours. CBC: Recent Labs    05/27/24 0237 05/28/24 0506 05/29/24 0235 06/04/24 0000  WBC 14.0* 9.0 6.4 6.1  NEUTROABS  --   --  5.0  --   HGB 10.5* 10.1* 9.7* 10.0*  HCT 32.8* 30.8* 28.9* 32*  MCV 93.4 91.7 90.6  --   PLT 126* 107* 121*  --    Cardiac Enzymes: No results for input(s): CKTOTAL, CKMB, CKMBINDEX, TROPONINI in the last 8760 hours. BNP: Invalid input(s): POCBNP CBG: No results for input(s): GLUCAP in the last 8760 hours.  Procedures and Imaging Studies During Stay: DG Swallowing Func-Speech Pathology Result Date: 05/28/2024 Table formatting from the original result was not included. Modified Barium Swallow Study Patient Details Name: Mehgan Santmyer MRN: [id number] Date of Birth: 05/18/1925 Today's Date: 05/28/2024 HPI/PMH: HPI: Pt is 89 year old presented [facility]  [facility] for fall and AMS. Admitted with UTI. CT C/A/P shows a large hiatal hernia but no focal consolidation or pulmonary edema. PMH: HTN, osteoporosis,DDD, depression, hip fx, GERD Clinical Impression: Pt exhibits mild pharyngeal dysphagia characterized by occasional mistiming and decreased BOT  retraction. Epiglottic inversion and laryngeal elevation are complete. Timing becomes more disorganized with larger boluses and the bolus pools in the pyriform sinuses before the swallow. This resulted in one instance of sensed aspiration with thin liquids as the bolus spilled over the pyriform sinuses before the laryngeal vestibule closes completely (PAS 7). Trace pharyngeal residue is suspected to be contributed to by reduced BOT retraction in addition to chronic degenerative cervical spine changes (see CT) and a prominent cricopharyngeus. The 13 mm barium tablet was given with purees, resulting in distal esophageal retention. Anticipate episodic aspiration of trace amounts (which is considered a mild impairment) and pt does not exhibit significant factors that may increase risk in its presence. Continue current diet with meds given whole in puree. Encourage cueing to control the rate/volume of sips of liquid and use of frequent oral care. Will f/u for ongoing education with pt and her family.  DIGEST Swallow Severity Rating*             Safety: 1             Efficiency: 1             Overall Pharyngeal Swallow Severity: 1 (mild) 1: mild; 2: moderate; 3: severe; 4: profound *The Dynamic Imaging Grade of Swallowing Toxicity is standardized for the head and neck cancer population, however, demonstrates promising clinical applications across populations to standardize the clinical rating of pharyngeal swallow safety and severity. Factors that may increase risk of adverse event in presence of aspiration Noe & Lianne 2021): Factors that may increase risk of adverse event in presence of aspiration Noe & Lianne 2021): Reduced  cognitive function; Limited mobility; Frail or deconditioned Recommendations/Plan: Swallowing Evaluation Recommendations Swallowing Evaluation Recommendations Recommendations: PO diet PO Diet Recommendation: Regular; Thin liquids (Level 0) Liquid Administration via: Cup; Straw Medication  Administration: Whole meds with puree Supervision: Full assist for feeding; Full supervision/cueing for swallowing strategies Swallowing strategies  : Slow rate; Small bites/sips Postural changes: Position pt fully upright for meals; Stay upright 30-60 min after meals Oral care recommendations: Oral care BID (2x/day) Recommended consults: Consider Palliative care Treatment Plan Treatment Plan Treatment recommendations: Defer treatment plan to SLP at other venue (see follow-up recommendations) Follow-up recommendations: Skilled nursing-short term rehab (<3 hours/day) Functional status assessment: Patient has had a recent decline in their functional status and demonstrates the ability to make significant improvements in function in a reasonable and predictable amount of time. Recommendations Recommendations for follow up therapy are one component of a multi-disciplinary discharge planning process, led by the attending physician.  Recommendations may be updated based on patient status, additional functional criteria and insurance authorization. Assessment: Orofacial Exam: Orofacial Exam Oral Cavity: Oral Hygiene: Xerostomia Oral Cavity - Dentition: Adequate natural dentition Orofacial Anatomy: WFL Oral Motor/Sensory Function: WFL Anatomy: Anatomy: Suspected cervical osteophytes; Prominent cricopharyngeus Boluses Administered: Boluses Administered Boluses Administered: Thin liquids (Level 0); Mildly thick liquids (Level 2, nectar thick); Moderately thick liquids (Level 3, honey thick); Puree; Solid  Oral Impairment Domain: Oral Impairment Domain Lip Closure: No labial escape Tongue control during bolus hold: Cohesive bolus between tongue to palatal seal Bolus preparation/mastication: Timely and efficient chewing and mashing Bolus transport/lingual motion: Brisk tongue motion Oral residue: Complete oral clearance Location of oral residue : N/A Initiation of pharyngeal swallow : Pyriform sinuses  Pharyngeal Impairment  Domain: Pharyngeal Impairment Domain Soft palate elevation: No bolus between soft palate (SP)/pharyngeal wall (PW) Laryngeal elevation: Complete superior movement of thyroid  cartilage with complete approximation of arytenoids to epiglottic petiole Anterior hyoid excursion: Complete anterior movement Epiglottic movement: Complete inversion Laryngeal vestibule closure: Complete, no air/contrast in laryngeal vestibule Pharyngeal stripping wave : Present - complete Pharyngeal contraction (A/P view only): N/A Pharyngoesophageal segment opening: Partial distention/partial duration, partial obstruction of flow Tongue base retraction: Wide column of contrast or air between tongue base and PPW Pharyngeal residue: Trace residue within or on pharyngeal structures Location of pharyngeal residue: Tongue base; Valleculae; Pyriform sinuses  Esophageal Impairment Domain: Esophageal Impairment Domain Esophageal clearance upright position: Esophageal retention Pill: Pill Consistency administered: Puree Puree: WFL Penetration/Aspiration Scale Score: Penetration/Aspiration Scale Score 1.  Material does not enter airway: Moderately thick liquids (Level 3, honey thick); Puree; Solid; Pill 2.  Material enters airway, remains ABOVE vocal cords then ejected out: Mildly thick liquids (Level 2, nectar thick) 7.  Material enters airway, passes BELOW cords and not ejected out despite cough attempt by patient: Thin liquids (Level 0) Compensatory Strategies: Compensatory Strategies Compensatory strategies: No   General Information: Caregiver present: No  Diet Prior to this Study: Regular; Thin liquids (Level 0)   Temperature : Normal   Respiratory Status: WFL   Supplemental O2: None (Room air)   History of Recent Intubation: No  Behavior/Cognition: Alert; Cooperative; Requires cueing Self-Feeding Abilities: Needs assist with self-feeding Baseline vocal quality/speech: Normal Volitional Cough: Able to elicit Volitional Swallow: Able to elicit Exam  Limitations: No limitations Goal Planning: Prognosis for improved oropharyngeal function: Fair Barriers to Reach Goals: Cognitive deficits; Time post onset No data recorded Patient/Family Stated Goal: none stated Consulted and agree with results and recommendations: Patient; Family member/caregiver Pain: Pain Assessment Pain Assessment: Faces Faces Pain  Scale: 0 Pain Location: all over Pain Descriptors / Indicators: Grimacing; Moaning Pain Intervention(s): Monitored during session End of Session: Start Time:SLP Start Time (ACUTE ONLY): 1017 Stop Time: SLP Stop Time (ACUTE ONLY): 1032 Time Calculation:SLP Time Calculation (min) (ACUTE ONLY): 15 min Charges: SLP Evaluations $ SLP Speech Visit: 1 Visit SLP Evaluations $BSS Swallow: 1 Procedure $MBS Swallow: 1 Procedure SLP visit diagnosis: SLP Visit Diagnosis: Dysphagia, pharyngeal phase (R13.13) Past Medical History: Past Medical History: Diagnosis Date  Abnormal mammogram, unspecified 04/19/2011  Allergy   Anxiety   Backache, unspecified 01/21/2005  Cervicalgia 08/20/2013  Chronic kidney disease, stage II (mild) 04/03/2009  Chronic rhinitis 09/28/2010  DDD (degenerative disc disease)   Depression   Dermatophytosis of nail 01/21/2005  Diverticulosis of colon (without mention of hemorrhage) 06/17/2005  Edema 11/26/2004  GERD (gastroesophageal reflux disease)   Hiatal hernia   High cholesterol   Hyperglycemia 11/18/2014  Hypertension   Insomnia, unspecified 10/18/2011  Left inguinal hernia 04/24/2014  Nocturia 09/19/2008  Osteoporosis, senile 11/18/2014  Other abnormal blood chemistry 04/03/2007  Pain in joint, pelvic region and thigh 11/12/2011  Pain in joint, shoulder region 08/15/2006  Pain in limb 08/26/2005  Palpitations 09/29/2009  PAOD (peripheral arterial occlusive disease) 11/18/2014  Right leg; diminished popliteal, dorsalis pedis, and posterior tibial artery pulsations   Postmenopausal atrophic vaginitis 12/14/2004  Scoliosis   Senile osteoporosis 12/14/2004  Tension headache 10/04/2005   Unspecified cataract 11/26/2004  Unspecified constipation 01/21/2005  Unspecified urinary incontinence 11/26/2004  Vaginal stenosis 11/18/2014  Tight band about 1/2 inches into the vagina which will not allow passage of my index finger.  Past Surgical History: Past Surgical History: Procedure Laterality Date  ABDOMINAL HYSTERECTOMY  1969  Dr.Braun   bilateral eyelid surgery  2005  Dr.Scott  BILATERAL OOPHORECTOMY  2002  BREAST SURGERY    benign tumor removal, left breast 1969  CATARACT EXTRACTION    Right  CHOLECYSTECTOMY  1990  Dr.Moore   COLONOSCOPY  3/18/20008  inflammation, diverticular associated colitis -Dr. Teressa  COSMETIC SURGERY    ESOPHAGOGASTRODUODENOSCOPY N/A 01/24/2014  Procedure: ESOPHAGOGASTRODUODENOSCOPY (EGD);  Surgeon: Norleen LOISE Kiang, MD;  Location: [facility] ENDOSCOPY;  Service: Endoscopy;  Laterality: N/A;  ESOPHAGOGASTRODUODENOSCOPY ENDOSCOPY  04/04/14  Dr. Teressa  EYE SURGERY    FOREIGN BODY REMOVAL N/A 01/24/2014  Procedure: FOREIGN BODY REMOVAL;  Surgeon: Norleen LOISE Kiang, MD;  Location: [facility] ENDOSCOPY;  Service: Endoscopy;  Laterality: N/A;  HIP PINNING,CANNULATED  11/07/2011  Procedure: CANNULATED HIP PINNING;  Surgeon: Lynwood FORBES Better, MD;  Location: [facility] ORS;  Service: Orthopedics;  Laterality: Left;  ORIF FEMORAL NECK FRACTURE W/ [facility]  11/07/2011  Dr.Nitka   TONSILLECTOMY    Dr.Joe Claudene  TUBAL LIGATION    VAGINAL PROLAPSE REPAIR  2002  Dr.Horback  Damien Blumenthal, M.A., CCC-SLP Speech Language Pathology, Acute Rehabilitation Services Secure Chat preferred [phone] 05/28/2024, 11:22 AM  CT CERVICAL SPINE WO CONTRAST Result Date: 05/26/2024 EXAM: CT CERVICAL SPINE WITHOUT CONTRAST 05/26/2024 07:11:31 PM TECHNIQUE: CT of the cervical spine was performed without the administration of intravenous contrast. Multiplanar reformatted images are provided for review. Automated exposure control, iterative reconstruction, and/or weight based adjustment of the mA/kV was utilized to reduce the radiation dose to as low as  reasonably achievable. COMPARISON: None available. CLINICAL HISTORY: Polytrauma, blunt. FINDINGS: BONES AND ALIGNMENT: 3 mm anterolisthesis C7-T1, likely degenerative in nature. Degenerative ankylosis C4-C5. Ankylosis of the facet joints of C3-C5 bilaterally. No acute fracture or traumatic listhesis. Motion degraded examination. DEGENERATIVE CHANGES: Advanced degenerative disc disease throughout the  cervical spine with disc space narrowing and endplate remodeling. 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 Multilevel uncovertebral and facet arthrosis results in multilevel moderate-to-severe neuroforaminal narrowing, most severe at C5-C6 and C6-C7. SOFT TISSUES: No prevertebral soft tissue swelling. IMPRESSION: 1. No acute fracture or traumatic listhesis. 2. Motion degraded examination. 3. 3 mm anterolisthesis at C7-T1, likely degenerative. 4. Advanced degenerative disc disease throughout the cervical spine with disc space narrowing and endplate remodeling. 5. Ankylosis of the facet joints of C3-C5 bilaterally. 6. Degenerative ankylosis at C4-5. 7. Moderate central canal stenosis at C4-5 secondary to posterior disc osteophyte complex and congenital narrowing of the pedicles. 8. Multilevel moderate-to-severe neuroforaminal narrowing, most severe at C5-6 and C6-7. Electronically signed by: Dorethia Molt MD MD 05/26/2024 07:50 PM EST RP Workstation: [id number]   CT CHEST ABDOMEN PELVIS WO CONTRAST Result Date: 05/26/2024 EXAM: CT CHEST, ABDOMEN AND PELVIS WITHOUT CONTRAST 05/26/2024 07:11:31 PM TECHNIQUE: CT of the chest, abdomen and pelvis was performed without the administration of intravenous contrast. Multiplanar reformatted images are provided for review. Automated exposure control, iterative reconstruction, and/or weight based adjustment of the mA/kV was utilized to reduce the radiation dose to as low as reasonably  achievable. COMPARISON: 04/20/2014 CLINICAL HISTORY: Polytrauma, blunt. Multiple unwitnessed fall, bruising, altered mental status. FINDINGS: CHEST: MEDIASTINUM AND LYMPH NODES: Heart and pericardium are unremarkable. Extensive multivessel coronary artery calcification. Extensive calcification of the aortic valve leaflets. The central pulmonary arteries are enlarged in keeping with changes of pulmonary arterial hypertension. Moderate atherosclerotic calcification within the thoracic aorta. 22 mm nodule within the left thyroid  lobe, not well assessed this examination. No follow-up imaging recommended unless clinically indicated. The central airways are clear. No mediastinal, hilar or axillary lymphadenopathy. LUNGS AND PLEURA: Large hiatal hernia, and large since prior examination, with the entire stomach, distal pancreas and the distal transverse and proximal descending colon within the thoracic compartment. 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 No focal consolidation or pulmonary edema. No pleural effusion. No pneumothorax. ABDOMEN AND PELVIS: LIVER: Unremarkable. GALLBLADDER AND BILE DUCTS: Status post cholecystectomy. No biliary ductal dilatation. SPLEEN: No acute abnormality. PANCREAS: No acute abnormality. ADRENAL GLANDS: No acute abnormality. KIDNEYS, URETERS AND BLADDER: Multiple nonobstructing calculi are seen within the right renal pelvis measuring 4 and 10 mm. There is superimposed mild asymmetric right perinephric and peripelvic stranding. Perinephric and peripelvic stranding from the right may reflect residual of a recently passed renal calculus. Alternatively, inflammatory condition such as an ascending urinary tract infection could appear similarly. Correlation with urinalysis and urine culture may be helpful for further management. No hydronephrosis. The left kidney is unremarkable; no intrarenal or ureteral calculi are identified and there is no hydronephrosis. Several  granular dependently layering calcifications are seen within the bladder lumen measuring 2-3 mm in size. The bladder is partially decompressed. There is, however, circumferential bladder wall thickening noted which may reflect changes of underlying cystitis or chronic bladder outlet obstruction. GI AND BOWEL: Severe descending and sigmoid colonic diverticulosis without superimposed acute inflammatory change. The visualized bowel is otherwise unremarkable. No evidence of obstruction. Appendix normal. REPRODUCTIVE ORGANS: Status post hysterectomy. No acute abnormality. PERITONEUM AND RETROPERITONEUM: No ascites. No free air. VASCULATURE: Aorta is normal in caliber. Moderate aortoiliac atherosclerotic calcification. ABDOMINAL AND PELVIS LYMPH NODES: No lymphadenopathy. BONES AND SOFT TISSUES: Left hip pinning has been performed. Severe lumbar dextroscoliosis. Osseous structures are age appropriate with advanced degenerative changes seen within the thoracolumbar spine. No focal soft  tissue abnormality. Had some masses. IMPRESSION: 1. Large hiatal hernia with organoaxial gastric volvulus without evidence of obstruction. 2. Multiple nonobstructing calculi in the right renal pelvis with mild asymmetric right perinephric and peripelvic stranding, which may reflect residual of a recently passed renal calculus or an ascending urinary tract infection; urinalysis and urine culture may be helpful for further management. 3. Circumferential bladder wall thickening, which may reflect cystitis or chronic bladder outlet obstruction. 4. Severe descending and sigmoid colonic diverticulosis without acute inflammatory change. 5. Enlarged central pulmonary arteries, in keeping with pulmonary arterial hypertension. 6. Extensive multivessel coronary artery calcification and extensive calcification of the aortic valve leaflets. 7. Left thyroid  nodule measuring 22 mm, not well assessed on this examination; no follow-up imaging recommended  unless clinically indicated. 8. Severe lumbar dextroscoliosis with advanced degenerative changes of the thoracolumbar spine. 9. Raf score includes aortic atherosclerosis (ICD10-I70.0). Electronically signed by: Dorethia Molt MD MD 05/26/2024 07:45 PM EST RP Workstation: [id number]   CT T-SPINE NO CHARGE Result Date: 05/26/2024 EXAM: CT THORACIC SPINE WITHOUT CONTRAST 05/26/2024 07:11:31 PM TECHNIQUE: CT of the thoracic spine was performed without the administration of intravenous contrast. Multiplanar reformatted images are provided for review. Automated exposure control, iterative reconstruction, and/or weight based adjustment of the mA/kV was utilized to reduce the radiation dose to as low as reasonably achievable. COMPARISON: None available. CLINICAL HISTORY: FINDINGS: BONES AND ALIGNMENT: Normal vertebral body heights. 2 mm anterolisthesis T2-3, likely degenerative in nature. No acute fracture or traumatic listhesis of the thoracic spine. No suspicious bone lesion. DEGENERATIVE CHANGES: Intervertebral disc space narrowing and endplate remodeling throughout the thoracic spine in keeping with changes of moderate-to-severe degenerative disc disease. No high-grade canal stenosis. Moderate bilateral neuroforaminal narrowing at T8-9. No high-grade neural foraminal narrowing identified. SOFT TISSUES: Left lower lobe consolidation, better assessed on accompanying CT examination of the chest, abdomen, and pelvis. Please refer to that report for further details. IMPRESSION: 1. No acute fracture or traumatic listhesis of the thoracic spine. 2. Left lower lobe consolidation. 3. Moderate-to-severe degenerative disc disease throughout the thoracic spine, without high-grade canal stenosis. 4. Moderate bilateral neuroforaminal narrowing at T8-9. 5. Minimal anterolisthesis at T2-3, likely degenerative. Electronically signed by: Dorethia Molt MD MD 05/26/2024 07:33 PM EST RP Workstation: [id number]   CT L-SPINE NO  CHARGE Result Date: 05/26/2024 EXAM: CT OF THE LUMBAR SPINE WITHOUT CONTRAST 05/26/2024 07:11:31 PM TECHNIQUE: CT of the lumbar spine was performed without the administration of intravenous contrast. Multiplanar reformatted images are provided for review. Automated exposure control, iterative reconstruction, and/or weight based adjustment of the mA/kV was utilized to reduce the radiation dose to as low as reasonably achievable. COMPARISON: None available. CLINICAL HISTORY: FINDINGS: BONES AND ALIGNMENT: Normal vertebral body heights. No acute fracture or suspicious bone lesion. No traumatic listhesis. Severe lumbar dextroscoliosis, apex right at L3. Mild loss of the normal lumbar lordosis. There is degenerative ankylosis of the L2 and L3 vertebral bodies along the left lateral aspect. DEGENERATIVE CHANGES: There is diffuse intervertebral disc space narrowing, endplate remodeling, and vacuum disc phenomena throughout the lumbar spine in keeping with changes of severe degenerative disc disease. Multifactorial moderate canal stenosis at L3-4 and L4-5. Severe right neuroforaminal narrowing at L4-5 and L5-S1 secondary to disc height loss and osteophytic ridging and facet arthrosis. Moderate-to-severe left neuroforaminal narrowing at L3-4 and L5-S1. SOFT TISSUES: Multiple nonobstructing calculi within the right kidney measuring up to 11 mm. Moderate aortoiliac atherosclerotic calcification. Severe sigmoid diverticulosis noted. IMPRESSION: 1. No acute fracture or traumatic listhesis. 2.  Severe lumbar dextroscoliosis, apex right at L3, with mild loss of the normal lumbar lordosis. 3. Severe degenerative disc disease with diffuse intervertebral disc space narrowing, endplate remodeling, and vacuum disc phenomena throughout the lumbar spine, with degenerative ankylosis of the L2 and L3 vertebral bodies along the left lateral aspect. 4. Multifactorial moderate canal stenosis at L3-4 and L4-5. 5. Severe right neuroforaminal  narrowing at L4-5 and L5-S1, with moderate-to-severe left neuroforaminal narrowing at L3-4 and L5-S1. 6. Multiple nonobstructing right renal calculi measuring up to 11 mm. 7. Moderate aortoiliac atherosclerotic calcification. 8. Severe sigmoid diverticulosis. Electronically signed by: Dorethia Molt MD MD 05/26/2024 07:30 PM EST RP Workstation: [id number]   CT HEAD WO CONTRAST Result Date: 05/26/2024 EXAM: CT HEAD WITHOUT CONTRAST 05/26/2024 07:11:31 PM TECHNIQUE: CT of the head was performed without the administration of intravenous contrast. Automated exposure control, iterative reconstruction, and/or weight based adjustment of the mA/kV was utilized to reduce the radiation dose to as low as reasonably achievable. COMPARISON: None available. CLINICAL HISTORY: Head trauma, moderate-severe. FINDINGS: BRAIN AND VENTRICLES: No acute hemorrhage. No evidence of acute infarct. No hydrocephalus. No extra-axial collection. No mass effect or midline shift. Cerebral volume loss. Moderate patchy supratentorial white matter hypodensities. ORBITS: No acute abnormality. SINUSES: Moderate mucosal thickening in left ethmoid sinus. SOFT TISSUES AND SKULL: No acute soft tissue abnormality. No skull fracture. VASCULATURE: Atherosclerosis. LIMITATIONS/ARTIFACTS: Motion artifact. IMPRESSION: 1. No acute intracranial abnormality. 2. Chronic cerebral volume loss and moderate chronic supratentorial white matter change. 3. Atherosclerosis. Electronically signed by: Dorethia Molt MD MD 05/26/2024 07:17 PM EST RP Workstation: [id number]    Assessment/Plan:   Hypertension blood pressure is controlled, on Nifedipine , ASA, Bun/creat 26/0.78 05/29/24  GERD (gastroesophageal reflux disease) stable, on Pantoprazole   Slow transit constipation on Senokot, MiraLax , lubiprostone   DDD (degenerative disc disease)  Lower back pain, taking Tylenol ,  ambulates with walker prior to hospitalization.   Chronic anemia Hgb 10, Vit B12 756  06/04/24  GAD (generalized anxiety disorder) Anxiety seen by Psych, off Clonazepam , resumed Lorazepam , on Hydroxyzine .              Depression: takes Mirtazapine , Lexapro , Wellbutrin , hydroxyzine , off clonazepam , resumed Lorazepam , TSH 1.62, Vit B12 756 06/04/24  Hyperlipidemia LDL goal <100 takes Pravastatin . LDL 82 06/04/24  Prediabetes Hgb A1c 5.9 06/04/24   Patient is being discharged with the following home health services:    Patient is being discharged with the following durable medical equipment:    Patient has been advised to f/u with their PCP in 1-2 weeks to bring them up to date on their rehab stay.  Social services at facility was responsible for arranging this appointment.  Pt was provided with a 30 day supply of prescriptions for medications and refills must be obtained from their PCP.  For controlled substances, a more limited supply may be provided adequate until PCP appointment only.  Future labs/tests needed:  prn       [1]  Allergies Allergen Reactions   Brexpiprazole Swelling   Lamictal [Lamotrigine] Swelling    Lip swelling   Penicillins Other (See Comments)    CHILDHOOD REACTION   Abilify [Aripiprazole] Rash  [2]  Allergies Allergen Reactions   Brexpiprazole Swelling   Lamictal [Lamotrigine] Swelling    Lip swelling   Penicillins Other (See Comments)    CHILDHOOD REACTION   Abilify [Aripiprazole] Rash   "

## 2024-06-13 ENCOUNTER — Encounter: Payer: Self-pay | Admitting: Nurse Practitioner

## 2024-06-13 NOTE — Progress Notes (Signed)
 This encounter was created in error - please disregard.
